# Patient Record
Sex: Male | Born: 1935
Health system: Southern US, Community
[De-identification: ages and names within clinical notes are randomized; demographics above are authoritative.]

## PROBLEM LIST (undated history)

## (undated) DIAGNOSIS — I442 Atrioventricular block, complete: Secondary | ICD-10-CM

## (undated) DIAGNOSIS — D4620 Refractory anemia with excess of blasts, unspecified: Principal | ICD-10-CM

## (undated) DIAGNOSIS — I10 Essential (primary) hypertension: Secondary | ICD-10-CM

## (undated) DIAGNOSIS — E538 Deficiency of other specified B group vitamins: Secondary | ICD-10-CM

## (undated) DIAGNOSIS — N200 Calculus of kidney: Secondary | ICD-10-CM

## (undated) DIAGNOSIS — N400 Enlarged prostate without lower urinary tract symptoms: Secondary | ICD-10-CM

## (undated) DIAGNOSIS — I5022 Chronic systolic (congestive) heart failure: Secondary | ICD-10-CM

## (undated) DIAGNOSIS — I509 Heart failure, unspecified: Secondary | ICD-10-CM

## (undated) DIAGNOSIS — Z95810 Presence of automatic (implantable) cardiac defibrillator: Secondary | ICD-10-CM

## (undated) DIAGNOSIS — I255 Ischemic cardiomyopathy: Secondary | ICD-10-CM

## (undated) DIAGNOSIS — E785 Hyperlipidemia, unspecified: Secondary | ICD-10-CM

## (undated) DIAGNOSIS — C61 Malignant neoplasm of prostate: Secondary | ICD-10-CM

## (undated) DIAGNOSIS — I2510 Atherosclerotic heart disease of native coronary artery without angina pectoris: Secondary | ICD-10-CM

## (undated) HISTORY — PX: PROSTATE SURGERY: SHX751

## (undated) HISTORY — DX: Essential (primary) hypertension: I10

## (undated) HISTORY — PX: POLYPECTOMY: SHX149

## (undated) HISTORY — DX: Chronic systolic (congestive) heart failure: I50.22

## (undated) HISTORY — DX: Malignant neoplasm of prostate: C61

## (undated) HISTORY — DX: Atrioventricular block, complete: I44.2

## (undated) HISTORY — DX: Heart failure, unspecified: I50.9

## (undated) HISTORY — DX: Benign prostatic hyperplasia without lower urinary tract symptoms: N40.0

## (undated) HISTORY — PX: COLONOSCOPY: SHX174

## (undated) HISTORY — DX: Ischemic cardiomyopathy: I25.5

## (undated) HISTORY — DX: Calculus of kidney: N20.0

## (undated) HISTORY — DX: Hyperlipidemia, unspecified: E78.5

## (undated) HISTORY — DX: Atherosclerotic heart disease of native coronary artery without angina pectoris: I25.10

## (undated) HISTORY — PX: CARDIAC DEFIBRILLATOR PLACEMENT: SHX171

## (undated) HISTORY — PX: CORONARY ARTERY BYPASS GRAFT: SHX141

## (undated) HISTORY — DX: Refractory anemia with excess of blasts, unspecified: D46.20

## (undated) HISTORY — DX: Presence of automatic (implantable) cardiac defibrillator: Z95.810

## (undated) HISTORY — PX: HERNIA REPAIR: SHX51

## (undated) HISTORY — DX: Deficiency of other specified B group vitamins: E53.8

---

## 2001-06-10 ENCOUNTER — Ambulatory Visit (HOSPITAL_COMMUNITY): Admission: RE | Admit: 2001-06-10 | Discharge: 2001-06-10 | Payer: Self-pay | Admitting: Ophthalmology

## 2001-07-22 ENCOUNTER — Ambulatory Visit (HOSPITAL_COMMUNITY): Admission: RE | Admit: 2001-07-22 | Discharge: 2001-07-22 | Payer: Self-pay | Admitting: Ophthalmology

## 2001-09-30 ENCOUNTER — Ambulatory Visit (HOSPITAL_COMMUNITY): Admission: RE | Admit: 2001-09-30 | Discharge: 2001-09-30 | Payer: Self-pay | Admitting: Pulmonary Disease

## 2004-04-26 ENCOUNTER — Ambulatory Visit (HOSPITAL_COMMUNITY): Admission: RE | Admit: 2004-04-26 | Discharge: 2004-04-26 | Payer: Self-pay | Admitting: Internal Medicine

## 2004-04-26 ENCOUNTER — Ambulatory Visit: Payer: Self-pay | Admitting: Internal Medicine

## 2004-07-04 ENCOUNTER — Ambulatory Visit (HOSPITAL_COMMUNITY): Admission: RE | Admit: 2004-07-04 | Discharge: 2004-07-04 | Payer: Self-pay | Admitting: Urology

## 2004-07-17 ENCOUNTER — Ambulatory Visit: Admission: RE | Admit: 2004-07-17 | Discharge: 2004-08-22 | Payer: Self-pay | Admitting: Radiation Oncology

## 2004-11-08 ENCOUNTER — Ambulatory Visit: Payer: Self-pay | Admitting: Cardiology

## 2004-11-15 ENCOUNTER — Ambulatory Visit: Payer: Self-pay

## 2004-11-22 ENCOUNTER — Ambulatory Visit: Payer: Self-pay | Admitting: Cardiology

## 2004-11-24 ENCOUNTER — Ambulatory Visit: Payer: Self-pay | Admitting: Cardiology

## 2004-11-24 ENCOUNTER — Inpatient Hospital Stay (HOSPITAL_BASED_OUTPATIENT_CLINIC_OR_DEPARTMENT_OTHER): Admission: RE | Admit: 2004-11-24 | Discharge: 2004-11-24 | Payer: Self-pay | Admitting: Cardiology

## 2004-11-28 ENCOUNTER — Ambulatory Visit: Payer: Self-pay | Admitting: Cardiology

## 2004-12-01 ENCOUNTER — Ambulatory Visit: Payer: Self-pay | Admitting: Cardiology

## 2004-12-07 ENCOUNTER — Ambulatory Visit: Payer: Self-pay | Admitting: Cardiology

## 2004-12-12 ENCOUNTER — Inpatient Hospital Stay (HOSPITAL_COMMUNITY): Admission: RE | Admit: 2004-12-12 | Discharge: 2004-12-20 | Payer: Self-pay | Admitting: Cardiothoracic Surgery

## 2005-01-03 ENCOUNTER — Ambulatory Visit: Payer: Self-pay | Admitting: Cardiology

## 2005-01-17 ENCOUNTER — Encounter (HOSPITAL_COMMUNITY): Admission: RE | Admit: 2005-01-17 | Discharge: 2005-02-09 | Payer: Self-pay | Admitting: Pediatrics

## 2005-02-12 ENCOUNTER — Encounter (HOSPITAL_COMMUNITY): Admission: RE | Admit: 2005-02-12 | Discharge: 2005-03-14 | Payer: Self-pay | Admitting: Cardiology

## 2005-02-27 ENCOUNTER — Ambulatory Visit: Payer: Self-pay | Admitting: Cardiology

## 2005-03-01 ENCOUNTER — Ambulatory Visit: Payer: Self-pay | Admitting: Cardiology

## 2005-03-09 ENCOUNTER — Ambulatory Visit (HOSPITAL_COMMUNITY): Admission: RE | Admit: 2005-03-09 | Discharge: 2005-03-09 | Payer: Self-pay | Admitting: Urology

## 2005-05-21 ENCOUNTER — Encounter (HOSPITAL_COMMUNITY): Admission: RE | Admit: 2005-05-21 | Discharge: 2005-06-20 | Payer: Self-pay | Admitting: Cardiology

## 2005-06-22 ENCOUNTER — Encounter (HOSPITAL_COMMUNITY): Admission: RE | Admit: 2005-06-22 | Discharge: 2005-06-22 | Payer: Self-pay | Admitting: Cardiology

## 2005-07-04 ENCOUNTER — Ambulatory Visit (HOSPITAL_COMMUNITY): Admission: RE | Admit: 2005-07-04 | Discharge: 2005-07-04 | Payer: Self-pay | Admitting: Internal Medicine

## 2005-07-04 ENCOUNTER — Ambulatory Visit: Payer: Self-pay | Admitting: Internal Medicine

## 2005-07-04 LAB — HM COLONOSCOPY

## 2005-07-13 ENCOUNTER — Encounter (HOSPITAL_COMMUNITY): Admission: RE | Admit: 2005-07-13 | Discharge: 2005-08-12 | Payer: Self-pay | Admitting: Cardiology

## 2005-08-13 ENCOUNTER — Encounter (HOSPITAL_COMMUNITY): Admission: RE | Admit: 2005-08-13 | Discharge: 2005-09-12 | Payer: Self-pay | Admitting: Cardiology

## 2005-09-14 ENCOUNTER — Encounter (HOSPITAL_COMMUNITY): Admission: RE | Admit: 2005-09-14 | Discharge: 2005-10-14 | Payer: Self-pay | Admitting: Cardiology

## 2006-01-02 ENCOUNTER — Ambulatory Visit: Payer: Self-pay | Admitting: Cardiology

## 2006-01-17 ENCOUNTER — Ambulatory Visit: Payer: Self-pay | Admitting: Cardiology

## 2006-01-17 ENCOUNTER — Encounter: Payer: Self-pay | Admitting: Cardiology

## 2006-01-17 ENCOUNTER — Ambulatory Visit: Payer: Self-pay

## 2006-02-04 ENCOUNTER — Ambulatory Visit: Payer: Self-pay | Admitting: Cardiology

## 2006-02-08 ENCOUNTER — Ambulatory Visit: Payer: Self-pay | Admitting: Internal Medicine

## 2006-02-25 ENCOUNTER — Ambulatory Visit: Payer: Self-pay | Admitting: Internal Medicine

## 2006-02-28 ENCOUNTER — Ambulatory Visit: Payer: Self-pay | Admitting: Internal Medicine

## 2006-02-28 ENCOUNTER — Ambulatory Visit (HOSPITAL_COMMUNITY): Admission: RE | Admit: 2006-02-28 | Discharge: 2006-03-01 | Payer: Self-pay | Admitting: Internal Medicine

## 2006-03-13 ENCOUNTER — Ambulatory Visit: Payer: Self-pay

## 2006-03-15 ENCOUNTER — Encounter (HOSPITAL_COMMUNITY): Admission: RE | Admit: 2006-03-15 | Discharge: 2006-04-14 | Payer: Self-pay | Admitting: Cardiology

## 2006-05-15 ENCOUNTER — Encounter (HOSPITAL_COMMUNITY): Admission: RE | Admit: 2006-05-15 | Discharge: 2006-06-14 | Payer: Self-pay | Admitting: Cardiology

## 2006-07-02 ENCOUNTER — Ambulatory Visit: Payer: Self-pay | Admitting: Internal Medicine

## 2006-07-08 ENCOUNTER — Encounter (HOSPITAL_COMMUNITY): Admission: RE | Admit: 2006-07-08 | Discharge: 2006-08-07 | Payer: Self-pay | Admitting: Cardiology

## 2006-08-12 ENCOUNTER — Encounter (HOSPITAL_COMMUNITY): Admission: RE | Admit: 2006-08-12 | Discharge: 2006-09-11 | Payer: Self-pay | Admitting: Cardiology

## 2006-08-30 ENCOUNTER — Ambulatory Visit: Payer: Self-pay | Admitting: Internal Medicine

## 2006-10-01 ENCOUNTER — Ambulatory Visit: Payer: Self-pay | Admitting: Internal Medicine

## 2006-12-16 ENCOUNTER — Ambulatory Visit: Payer: Self-pay | Admitting: Cardiology

## 2006-12-24 ENCOUNTER — Ambulatory Visit: Payer: Self-pay

## 2006-12-24 ENCOUNTER — Ambulatory Visit: Payer: Self-pay | Admitting: Cardiology

## 2006-12-24 LAB — CONVERTED CEMR LAB
AST: 16 units/L (ref 0–37)
Alkaline Phosphatase: 48 units/L (ref 39–117)
Basophils Absolute: 0 10*3/uL (ref 0.0–0.1)
Bilirubin, Direct: 0.1 mg/dL (ref 0.0–0.3)
Eosinophils Relative: 1.5 % (ref 0.0–5.0)
GFR calc non Af Amer: 69 mL/min
HCT: 45 % (ref 39.0–52.0)
HDL: 32 mg/dL — ABNORMAL LOW (ref >39.0)
Hemoglobin: 15.6 g/dL (ref 13.0–17.0)
LDL Cholesterol: 49 mg/dL (ref 0–99)
Lymphocytes Relative: 26.8 % (ref 12.0–46.0)
MCV: 87.8 fL (ref 78.0–100.0)
Neutro Abs: 3.1 10*3/uL (ref 1.4–7.7)
Neutrophils Relative %: 61.7 % (ref 43.0–77.0)
Pro B Natriuretic peptide (BNP): 43 pg/mL (ref 0.0–100.0)
Sodium: 142 meq/L (ref 135–145)
Triglycerides: 242 mg/dL (ref 0–149)
VLDL: 48 mg/dL — ABNORMAL HIGH (ref 0–40)
WBC: 5.1 10*3/uL (ref 4.5–10.5)

## 2007-04-01 ENCOUNTER — Ambulatory Visit: Payer: Self-pay | Admitting: Internal Medicine

## 2007-06-17 ENCOUNTER — Ambulatory Visit: Payer: Self-pay | Admitting: Cardiology

## 2007-06-18 ENCOUNTER — Ambulatory Visit: Payer: Self-pay

## 2007-06-18 ENCOUNTER — Ambulatory Visit: Payer: Self-pay | Admitting: Cardiology

## 2007-06-18 ENCOUNTER — Encounter: Payer: Self-pay | Admitting: Cardiology

## 2007-06-18 LAB — CONVERTED CEMR LAB
ALT: 24 units/L (ref 0–53)
BUN: 19 mg/dL (ref 6–23)
Basophils Relative: 0.2 % (ref 0.0–1.0)
CO2: 32 meq/L (ref 19–32)
Chloride: 105 meq/L (ref 96–112)
Creatinine, Ser: 1 mg/dL (ref 0.4–1.5)
GFR calc Af Amer: 93 mL/min
Glucose, Bld: 107 mg/dL — ABNORMAL HIGH (ref 70–99)
HDL: 31.1 mg/dL — ABNORMAL LOW (ref >39.0)
Hemoglobin: 14.7 g/dL (ref 13.0–17.0)
LDL Cholesterol: 52 mg/dL (ref 0–99)
MCV: 88.8 fL (ref 78.0–100.0)
Neutro Abs: 2.7 10*3/uL (ref 1.4–7.7)
Neutrophils Relative %: 61.1 % (ref 43.0–77.0)
Pro B Natriuretic peptide (BNP): 60 pg/mL (ref 0.0–100.0)
RBC: 4.87 M/uL (ref 4.22–5.81)
RDW: 12.5 % (ref 11.5–14.6)
Sodium: 142 meq/L (ref 135–145)
Triglycerides: 141 mg/dL (ref 0–149)
WBC: 4.4 10*3/uL — ABNORMAL LOW (ref 4.5–10.5)

## 2007-09-16 ENCOUNTER — Ambulatory Visit: Payer: Self-pay | Admitting: Internal Medicine

## 2007-12-29 ENCOUNTER — Ambulatory Visit (HOSPITAL_COMMUNITY): Admission: RE | Admit: 2007-12-29 | Discharge: 2007-12-29 | Payer: Self-pay | Admitting: Pulmonary Disease

## 2007-12-31 ENCOUNTER — Ambulatory Visit: Payer: Self-pay | Admitting: Cardiology

## 2008-01-01 ENCOUNTER — Ambulatory Visit (HOSPITAL_COMMUNITY): Admission: RE | Admit: 2008-01-01 | Discharge: 2008-01-01 | Payer: Self-pay | Admitting: Pulmonary Disease

## 2008-03-30 ENCOUNTER — Ambulatory Visit: Payer: Self-pay | Admitting: Internal Medicine

## 2008-05-14 LAB — HM COLONOSCOPY

## 2008-06-22 ENCOUNTER — Ambulatory Visit: Payer: Self-pay | Admitting: Cardiology

## 2008-06-22 LAB — CONVERTED CEMR LAB
ALT: 22 units/L (ref 0–53)
Albumin: 3.9 g/dL (ref 3.5–5.2)
Cholesterol: 115 mg/dL (ref 0–200)
HDL: 34.8 mg/dL — ABNORMAL LOW (ref >39.0)
Total Protein: 6.9 g/dL (ref 6.0–8.3)

## 2008-07-01 ENCOUNTER — Encounter (INDEPENDENT_AMBULATORY_CARE_PROVIDER_SITE_OTHER): Payer: Self-pay | Admitting: Internal Medicine

## 2008-07-01 ENCOUNTER — Ambulatory Visit (HOSPITAL_COMMUNITY): Admission: RE | Admit: 2008-07-01 | Discharge: 2008-07-01 | Payer: Self-pay | Admitting: Internal Medicine

## 2008-07-27 ENCOUNTER — Encounter: Payer: Self-pay | Admitting: Internal Medicine

## 2008-08-03 ENCOUNTER — Encounter: Payer: Self-pay | Admitting: Cardiology

## 2008-08-03 ENCOUNTER — Ambulatory Visit: Payer: Self-pay | Admitting: Internal Medicine

## 2008-08-03 DIAGNOSIS — E785 Hyperlipidemia, unspecified: Secondary | ICD-10-CM

## 2008-08-03 DIAGNOSIS — Z87442 Personal history of urinary calculi: Secondary | ICD-10-CM | POA: Insufficient documentation

## 2008-08-03 DIAGNOSIS — Z951 Presence of aortocoronary bypass graft: Secondary | ICD-10-CM | POA: Insufficient documentation

## 2008-08-03 DIAGNOSIS — C61 Malignant neoplasm of prostate: Secondary | ICD-10-CM | POA: Insufficient documentation

## 2008-08-03 DIAGNOSIS — I255 Ischemic cardiomyopathy: Secondary | ICD-10-CM | POA: Insufficient documentation

## 2008-08-03 DIAGNOSIS — I10 Essential (primary) hypertension: Secondary | ICD-10-CM | POA: Insufficient documentation

## 2008-08-03 DIAGNOSIS — I2589 Other forms of chronic ischemic heart disease: Secondary | ICD-10-CM

## 2008-08-03 DIAGNOSIS — I5022 Chronic systolic (congestive) heart failure: Secondary | ICD-10-CM | POA: Insufficient documentation

## 2008-08-03 DIAGNOSIS — I252 Old myocardial infarction: Secondary | ICD-10-CM | POA: Insufficient documentation

## 2008-08-03 DIAGNOSIS — I2510 Atherosclerotic heart disease of native coronary artery without angina pectoris: Secondary | ICD-10-CM | POA: Insufficient documentation

## 2008-08-24 DIAGNOSIS — I25810 Atherosclerosis of coronary artery bypass graft(s) without angina pectoris: Secondary | ICD-10-CM | POA: Insufficient documentation

## 2008-08-25 ENCOUNTER — Encounter: Payer: Self-pay | Admitting: Cardiology

## 2008-08-25 ENCOUNTER — Ambulatory Visit: Payer: Self-pay | Admitting: Cardiology

## 2008-11-02 ENCOUNTER — Ambulatory Visit: Payer: Self-pay | Admitting: Internal Medicine

## 2008-11-15 ENCOUNTER — Encounter: Payer: Self-pay | Admitting: Internal Medicine

## 2009-01-18 ENCOUNTER — Ambulatory Visit (HOSPITAL_COMMUNITY): Admission: RE | Admit: 2009-01-18 | Discharge: 2009-01-18 | Payer: Self-pay | Admitting: Pulmonary Disease

## 2009-02-08 ENCOUNTER — Ambulatory Visit: Payer: Self-pay | Admitting: Internal Medicine

## 2009-02-17 ENCOUNTER — Encounter: Payer: Self-pay | Admitting: Internal Medicine

## 2009-05-10 ENCOUNTER — Ambulatory Visit: Payer: Self-pay | Admitting: Internal Medicine

## 2009-06-02 ENCOUNTER — Encounter: Payer: Self-pay | Admitting: Internal Medicine

## 2009-07-26 ENCOUNTER — Ambulatory Visit: Payer: Self-pay | Admitting: Internal Medicine

## 2009-07-26 DIAGNOSIS — Z95810 Presence of automatic (implantable) cardiac defibrillator: Secondary | ICD-10-CM

## 2009-08-04 ENCOUNTER — Encounter: Payer: Self-pay | Admitting: Cardiology

## 2009-08-12 ENCOUNTER — Ambulatory Visit: Payer: Self-pay | Admitting: Cardiology

## 2009-09-27 ENCOUNTER — Ambulatory Visit: Payer: Self-pay | Admitting: Cardiology

## 2009-09-30 LAB — CONVERTED CEMR LAB
Albumin: 3.9 g/dL (ref 3.5–5.2)
Alkaline Phosphatase: 53 units/L (ref 39–117)
Bilirubin, Direct: 0.1 mg/dL (ref 0.0–0.3)
Total Bilirubin: 0.8 mg/dL (ref 0.3–1.2)
Total CHOL/HDL Ratio: 4
Triglycerides: 162 mg/dL — ABNORMAL HIGH (ref 0.0–149.0)

## 2009-10-25 ENCOUNTER — Ambulatory Visit: Payer: Self-pay | Admitting: Internal Medicine

## 2009-11-07 ENCOUNTER — Telehealth: Payer: Self-pay | Admitting: Internal Medicine

## 2009-11-10 ENCOUNTER — Encounter: Payer: Self-pay | Admitting: Internal Medicine

## 2010-01-26 ENCOUNTER — Encounter: Payer: Self-pay | Admitting: Internal Medicine

## 2010-01-26 ENCOUNTER — Ambulatory Visit: Payer: Self-pay | Admitting: Internal Medicine

## 2010-02-14 ENCOUNTER — Ambulatory Visit: Payer: Self-pay | Admitting: Cardiology

## 2010-02-22 ENCOUNTER — Encounter: Payer: Self-pay | Admitting: Internal Medicine

## 2010-02-23 ENCOUNTER — Encounter: Payer: Self-pay | Admitting: Cardiology

## 2010-02-23 ENCOUNTER — Ambulatory Visit: Payer: Self-pay

## 2010-02-23 ENCOUNTER — Ambulatory Visit: Payer: Self-pay | Admitting: Cardiology

## 2010-02-23 ENCOUNTER — Ambulatory Visit (HOSPITAL_COMMUNITY): Admission: RE | Admit: 2010-02-23 | Discharge: 2010-02-23 | Payer: Self-pay | Admitting: Cardiology

## 2010-02-23 LAB — CONVERTED CEMR LAB
BUN: 31 mg/dL — ABNORMAL HIGH (ref 6–23)
Calcium: 9.3 mg/dL (ref 8.4–10.5)
Chloride: 99 meq/L (ref 96–112)
GFR calc non Af Amer: 54.81 mL/min (ref >60)

## 2010-05-04 ENCOUNTER — Ambulatory Visit: Payer: Self-pay | Admitting: Internal Medicine

## 2010-05-23 ENCOUNTER — Encounter (INDEPENDENT_AMBULATORY_CARE_PROVIDER_SITE_OTHER): Payer: Self-pay | Admitting: *Deleted

## 2010-06-11 LAB — CONVERTED CEMR LAB
BUN: 21 mg/dL (ref 6–23)
CO2: 30 meq/L (ref 19–32)
Chloride: 104 meq/L (ref 96–112)
Eosinophils Relative: 2.4 % (ref 0.0–5.0)
GFR calc non Af Amer: 73.62 mL/min (ref >60)
Glucose, Bld: 91 mg/dL (ref 70–99)
Hemoglobin: 14.4 g/dL (ref 13.0–17.0)
MCHC: 34.8 g/dL (ref 30.0–36.0)
MCV: 91 fL (ref 78.0–100.0)
Monocytes Absolute: 0.5 10*3/uL (ref 0.1–1.0)
Potassium: 4.5 meq/L (ref 3.5–5.1)
RDW: 13.5 % (ref 11.5–14.6)
Sodium: 140 meq/L (ref 135–145)

## 2010-06-15 NOTE — Assessment & Plan Note (Signed)
Summary: 1 YR F/U      Allergies Added:   Primary Provider:  Kari Baars, MD  CC:  1 year follow up/Pt feeling good today.  No cardiac concerns.  History of Present Illness: The patient is 77 years old and return for followup management of CAD. He was found to have severe three-vessel disease and severe LV dysfunction on preoperative evaluation prior to prostate surgery in 2006. He underwent bypass surgery. He has done well since that time. He's had no recent chest pain or palpitations. He does get short of breath with moderate exertion but this has not changed.  He also has an ICD which was implanted by Dr. Ladona Ridgel in 2006.  His other problems include hypertension and hyperlipidemia. He also has prostate cancer and indicated that his last PSA was quite low. He brought a lipid profile in today and his LDL was 56 on Vytorin. For cost reasons he was recently switched to simvastatin.  Current Medications (verified): 1)  Aspirin Ec 325 Mg Tbec (Aspirin) .... Take One Tablet By Mouth Daily 2)  Diovan 160 Mg Tabs (Valsartan) .... Take One Tablet By Mouth Daily 3)  Vitamin C 500 Mg  Tabs (Ascorbic Acid) .Marland Kitchen.. 1 By Mouth Once Daily 4)  Vitamin D3 1000 Unit Caps (Cholecalciferol) .... Once Daily 5)  Carvedilol 25 Mg Tabs (Carvedilol) .... 1/2 By Mouth Two Times A Day 6)  Tylenol Extra Strength 500 Mg Tabs (Acetaminophen) .... Take 2 By Mouth As Needed 7)  Zyrtec Allergy 10 Mg Tabs (Cetirizine Hcl) .... Take 1 By Mouth As Needed 8)  Osteo Bi-Flex Triple Strength  Tabs (Misc Natural Products) .... Take One Tablet By Mouth Twice Daily. 9)  Simvastatin 40 Mg Tabs (Simvastatin) .Marland Kitchen.. 1 Tablet At Bedtime  Allergies (verified): 1)  ! Codeine  Past History:  Past Medical History: Reviewed history from 08/24/2008 and no changes required. Current Problems:  POSTSURGICAL AORTOCORONARY BYPASS STATUS (ICD-V45.81) NEPHROLITHIASIS, HX OF (ICD-V13.01) SYSTOLIC HEART FAILURE, CHRONIC  (ICD-428.22) MYOCARDIAL INFARCTION, HX OF (ICD-412) HYPERTENSION, UNSPECIFIED (ICD-401.9) PROSTATE CANCER (ICD-185) HYPERLIPIDEMIA-MIXED (ICD-272.4) CARDIOMYOPATHY, ISCHEMIC (ICD-414.8) CAD, UNSPECIFIED SITE (ICD-414.00) 1. Coronary artery disease, status post coronary artery bypass graft     surgery in 2006. 2. Ischemic cardiomyopathy, ejection fraction of 25-35%. 3. Systolic congestive heart failure, class II euvolemic. 4. Hyperlipidemia with low HDL. 5. History of prostate cancer, status post surgery. 6. Hypertension, treated. 7. Recent upper respiratory infection. 8. Abnormal chest x-ray with questionable nodule.  9.  S/P ICD VVI backup  Review of Systems       ROS is negative except as outlined in HPI.   Vital Signs:  Patient profile:   77 year old male Height:      65 inches Weight:      201 pounds BMI:     33.57 Pulse rate:   61 / minute Pulse rhythm:   regular BP sitting:   128 / 74  (left arm) Cuff size:   large  Vitals Entered By: Judithe Modest CMA (August 12, 2009 11:06 AM)  Physical Exam  Additional Exam:  Gen. Well-nourished, in no distress   Neck: No JVD, thyroid not enlarged, no carotid bruits Lungs: No tachypnea, clear without rales, rhonchi or wheezes Cardiovascular: Rhythm regular, PMI not displaced,  heart sounds  normal, no murmurs or gallops, no peripheral edema, pulses normal in all 4 extremities. Abdomen: BS normal, abdomen soft and non-tender without masses or organomegaly, no hepatosplenomegaly. MS: No deformities, no cyanosis or clubbing  Neuro:  No focal sns   Skin:  no lesions     ICD Specifications ICD Vendor:  Medtronic     ICD Model Number:  7232     ICD Serial Number:  [id number] ICD DOI:  02/28/2006      Lead 1:    Location: RV     DOI: 02/28/2006     Model #: [id number]     Serial #: [id number]     Status: active  Indications::  ICM  Explantation Comments: [facility]  ICD Follow Up ICD Dependent:  No      Episodes Coumadin:   No  Brady Parameters Mode VVI     Lower Rate Limit:  40      Tachy Zones VF:  200     VT:  250     VT1:  162     Impression & Recommendations:  Problem # 1:  CAD, AUTOLOGOUS BYPASS GRAFT (ICD-414.02) He had bypass surgery in 2006. He has had no recent chest pain. This problem appears stable. His updated medication list for this problem includes:    Aspirin Ec 325 Mg Tbec (Aspirin) .Marland Kitchen... Take one tablet by mouth daily    Carvedilol 25 Mg Tabs (Carvedilol) .Marland Kitchen... 1/2 by mouth two times a day  Orders: EKG w/ Interpretation (93000)  Problem # 2:  SYSTOLIC HEART FAILURE, CHRONIC (ICD-428.22) He has an ejection fraction of 25-30%. He has shortness of breath with exertion but appears euvolemic.  we will continue current therapy. His updated medication list for this problem includes:    Aspirin Ec 325 Mg Tbec (Aspirin) .Marland Kitchen... Take one tablet by mouth daily    Diovan 160 Mg Tabs (Valsartan) .Marland Kitchen... Take one tablet by mouth daily    Carvedilol 25 Mg Tabs (Carvedilol) .Marland Kitchen... 1/2 by mouth two times a day  Problem # 3:  AUTOMATIC IMPLANTABLE CARDIAC DEFIBRILLATOR SITU (ICD-V45.02) He has an ICD backup. This is followed by Dr. Ladona Ridgel.  Problem # 4:  HYPERLIPIDEMIA-MIXED (ICD-272.4) He was recently switched from Vytorin to simvastatin for cost reasons. We will check a lipid profile 6 weeks after starting simvastatin. His updated medication list for this problem includes:    Simvastatin 40 Mg Tabs (Simvastatin) .Marland Kitchen... 1 tablet at bedtime  Patient Instructions: 1)  Your physician recommends that you return for lab work in: 6 weeks after starting simvastatin. 2)  Your physician has recommended you make the following change in your medication: 1) complete your current dose of Vytorin then start simvastatin. 3)  Your physician wants you to follow-up in: 6 months.   You will receive a reminder letter in the mail two months in advance. If you don't receive a letter, please call our office to schedule the  follow-up appointment.

## 2010-06-15 NOTE — Cardiovascular Report (Signed)
Summary: Office Visit Remote   Office Visit Remote   Imported By: Roderic Ovens 06/02/2010 10:43:10  _____________________________________________________________________  External Attachment:    Type:   Image     Comment:   External Document

## 2010-06-15 NOTE — Cardiovascular Report (Signed)
Summary: Office Visit Remote   Office Visit Remote   Imported By: Roderic Ovens 02/23/2010 11:08:07  _____________________________________________________________________  External Attachment:    Type:   Image     Comment:   External Document

## 2010-06-15 NOTE — Letter (Signed)
Summary: Remote Device Check  [facility] [employer], Main Office  [address]   [place], [place] [address]   Phone: [phone]  Fax: [phone]     November 10, 2009 MRN: [id number]   RAYMUNDO ROUT [address] [place], [place]  [address]   Dear Mr. Goehring,   Your remote transmission was recieved and reviewed by your physician.  All diagnostics were within normal limits for you.  __X___Your next transmission is scheduled for: 01-26-2010.  Please transmit at any time this day.  If you have a wireless device your transmission will be sent automatically.   Sincerely,  Vella Kohler

## 2010-06-15 NOTE — Letter (Signed)
Summary: Remote Device Check  [facility], Main Office  [address]   [place], [place] [address]   Phone: [phone]  Fax: [phone]     February 22, 2010 MRN: [id number]   HUBERT RAATZ [address] [place], [place]  [address]   Dear Mr. Dozal,   Your remote transmission was recieved and reviewed by your physician.  All diagnostics were within normal limits for you.  __X___Your next transmission is scheduled for:  05-04-2010.  Please transmit at any time this day.  If you have a wireless device your transmission will be sent automatically.   Sincerely,  Vella Kohler

## 2010-06-15 NOTE — Assessment & Plan Note (Signed)
Summary: pc2  Medications Added VITAMIN D3 1000 UNIT CAPS (CHOLECALCIFEROL) once daily OSTEO BI-FLEX TRIPLE STRENGTH  TABS (MISC NATURAL PRODUCTS) Take one tablet by mouth twice daily. SIMVASTATIN 40 MG TABS (SIMVASTATIN) 1 tablet at bedtime        Visit Type:  Follow-up Primary Provider:  Kari Baars, MD   History of Present Illness: [name] returns today for followup.  He is a patient of Dr. Shaune Pollack with coronary artery disease, ischemic cardiomyopathy, and severe LV dysfunction with class II heart failure.  He also has prostate cancer and has had surgery.  The patient returns today for followup.  He denies chest pain.  He denies shortness of breath.  He continues to walk almost daily either outside when it is nice or inside at the [facility].  He has had no intercurrent ICD therapies. He wishes to change his cholesterol lowering medication.  Current Medications (verified): 1)  Aspirin Ec 325 Mg Tbec (Aspirin) .... Take One Tablet By Mouth Daily 2)  Vytorin 10-40 Mg Tabs (Ezetimibe-Simvastatin) .... Take One Tablet By Mouth Dailyat Bedtime 3)  Diovan 160 Mg Tabs (Valsartan) .... Take One Tablet By Mouth Daily 4)  Vitamin C 500 Mg  Tabs (Ascorbic Acid) .Marland Kitchen.. 1 By Mouth Once Daily 5)  Vitamin D3 1000 Unit Caps (Cholecalciferol) .... Once Daily 6)  Carvedilol 25 Mg Tabs (Carvedilol) .... 1/2 By Mouth Two Times A Day 7)  Tylenol Extra Strength 500 Mg Tabs (Acetaminophen) .... Take 2 By Mouth As Needed 8)  Zyrtec Allergy 10 Mg Tabs (Cetirizine Hcl) .... Take 1 By Mouth As Needed 9)  Osteo Bi-Flex Triple Strength  Tabs (Misc Natural Products) .... Take One Tablet By Mouth Twice Daily.  Allergies: 1)  ! Codeine  Past History:  Past Medical History: Last updated: 08/24/2008 Current Problems:  POSTSURGICAL AORTOCORONARY BYPASS STATUS (ICD-V45.81) NEPHROLITHIASIS, HX OF (ICD-V13.01) SYSTOLIC HEART FAILURE, CHRONIC (ICD-428.22) MYOCARDIAL INFARCTION, HX OF  (ICD-412) HYPERTENSION, UNSPECIFIED (ICD-401.9) PROSTATE CANCER (ICD-185) HYPERLIPIDEMIA-MIXED (ICD-272.4) CARDIOMYOPATHY, ISCHEMIC (ICD-414.8) CAD, UNSPECIFIED SITE (ICD-414.00) 1. Coronary artery disease, status post coronary artery bypass graft     surgery in 2006. 2. Ischemic cardiomyopathy, ejection fraction of 25-35%. 3. Systolic congestive heart failure, class II euvolemic. 4. Hyperlipidemia with low HDL. 5. History of prostate cancer, status post surgery. 6. Hypertension, treated. 7. Recent upper respiratory infection. 8. Abnormal chest x-ray with questionable nodule.  9.  S/P ICD VVI backup  Past Surgical History: Last updated: 08/03/2008 History of benign prostatic hypertrophy and prostate surgery. Status post aortocoronary bypass surgery in August 2006 with left      internal mammary artery to left anterior descending artery, saphenous      vein graft to circumflex, saphenous vein graft to distal right coronary  artery.  Status post colonoscopy and polypectomy. Status post hernia repair.  Review of Systems  The patient denies chest pain, syncope, dyspnea on exertion, and peripheral edema.    Vital Signs:  Patient profile:   77 year old male Height:      65 inches Weight:      203 pounds BMI:     33.90 Pulse rate:   63 / minute BP sitting:   140 / 80  (left arm)  Vitals Entered By: Laurance Flatten CMA (July 26, 2009 4:12 PM)  Physical Exam  General:  Elderly, well developed, well nourished, in no acute distress.  HEENT: normal Neck: supple. No JVD. Carotids 2+ bilaterally no bruits Cor: RRR no rubs, gallops or murmur Lungs:  CTA. Well healed ICD incision. Ab: soft, nontender. nondistended. No HSM. Good bowel sounds Ext: warm. no cyanosis, clubbing or edema Neuro: alert and oriented. Grossly nonfocal. affect pleasant     ICD Specifications ICD Vendor:  Medtronic     ICD Model Number:  7232     ICD Serial Number:  [id number] ICD DOI:  02/28/2006      Lead  1:    Location: RV     DOI: 02/28/2006     Model #: [id number]     Serial #: [id number]     Status: active  Indications::  ICM  Explantation Comments: [facility]  ICD Follow Up Remote Check?  No Battery Voltage:  3.10 V     Charge Time:  7.69 seconds     ICD Dependent:  No       ICD Device Measurements Right Ventricle:  Amplitude: 7.2 mV, Impedance: 440 ohms, Threshold: 1.0 V at 0.4 msec Shock Impedance: 44/54 ohms   Episodes Coumadin:  No Shock:  0     ATP:  0     Nonsustained:  2     Ventricular Pacing:  <0.1%  Brady Parameters Mode VVI     Lower Rate Limit:  40      Tachy Zones VF:  200     VT:  250     VT1:  162     Next Remote Date:  10/25/2009     Next Cardiology Appt Due:  07/13/2010 Tech Comments:  No parameter changes..  Device function normal.  Checked by industry.  [facility] transmissions every 3 months.  ROV 1 year with Dr. Ladona Ridgel in [place]. Altha Harm, LPN  July 26, 2009 4:32 PM  MD Comments:  Agree with above.  Impression & Recommendations:  Problem # 1:  AUTOMATIC IMPLANTABLE CARDIAC DEFIBRILLATOR SITU (ICD-V45.02) His device is working normally.  Will recheck in several months.  Problem # 2:  CAD, AUTOLOGOUS BYPASS GRAFT (ICD-414.02) He denies chest pain. Continue current meds. His updated medication list for this problem includes:    Aspirin Ec 325 Mg Tbec (Aspirin) .Marland Kitchen... Take one tablet by mouth daily    Carvedilol 25 Mg Tabs (Carvedilol) .Marland Kitchen... 1/2 by mouth two times a day  Problem # 3:  SYSTOLIC HEART FAILURE, CHRONIC (ICD-428.22) He remains class 2.  A low sodium diet and continued medical therapy are requested. His updated medication list for this problem includes:    Aspirin Ec 325 Mg Tbec (Aspirin) .Marland Kitchen... Take one tablet by mouth daily    Diovan 160 Mg Tabs (Valsartan) .Marland Kitchen... Take one tablet by mouth daily    Carvedilol 25 Mg Tabs (Carvedilol) .Marland Kitchen... 1/2 by mouth two times a day  Patient Instructions: 1)  Your physician has recommended you make the following  change in your medication:  finish Vytorin then start Simvastatin 40mg  every evening 2)  Your physician wants you to follow-up in: 12 months in [place]  You will receive a reminder letter in the mail two months in advance. If you don't receive a letter, please call our office to schedule the follow-up appointment. Prescriptions: SIMVASTATIN 40 MG TABS (SIMVASTATIN) 1 tablet at bedtime  #30 x 12   Entered by:   Layne Benton, RN, BSN   Authorized by:   Laren Boom, MD, [facility]   Signed by:   Layne Benton, RN, BSN on 07/26/2009   Method used:   Electronically to        [facility] (retail)  [address]       [place], [place]  [address]       Ph: [phone]       Fax: [phone]   RxID:   [id number]

## 2010-06-15 NOTE — Cardiovascular Report (Signed)
Summary: Office Visit Remote   Office Visit Remote   Imported By: Roderic Ovens 06/08/2009 12:27:20  _____________________________________________________________________  External Attachment:    Type:   Image     Comment:   External Document

## 2010-06-15 NOTE — Assessment & Plan Note (Signed)
Summary: bp check/ medication change/echo at 10:30/labs also  Nurse Visit   Vital Signs:  Patient profile:   77 year old male Height:      65 inches Weight:      195.50 pounds BMI:     32.65 Pulse rate:   66 / minute Resp:     24 per minute BP sitting:   132 / 78  (left arm)  Impression & Recommendations:  Problem # 1:  HYPERTENSION, UNSPECIFIED (ICD-401.9)  His updated medication list for this problem includes:    Aspirin Ec 325 Mg Tbec (Aspirin) .Marland Kitchen... Take one tablet by mouth daily    Diovan 160 Mg Tabs (Valsartan) .Marland Kitchen... Take one tablet by mouth daily    Carvedilol 25 Mg Tabs (Carvedilol) .Marland Kitchen... 1/2 by mouth two times a day    Hydrochlorothiazide 25 Mg Tabs (Hydrochlorothiazide) .Marland Kitchen... Take one tablet by mouth daily. Pt. in for B/P check this AM. Pt. also had an Echo and  BMET/BNP blood work today. B/P right arm 130/76 left arm 132/78, Pulse 66 beats/min. Pt. took medications this AM. Pt had no c/o at this time.   Allergies: 1)  ! Codeine

## 2010-06-15 NOTE — Progress Notes (Signed)
Summary: pt went through [employer] Note Outgoing Call Call back at [facility] Phone [phone]   Caller: Patient Reason for Call: Talk to Nurse, Talk to Doctor Summary of Call: pt went to court house yesterday and went through [facility] and they also use the wand as well and patient was wondering would that have done any damage and what he needs to do Initial call taken by: Omer Jack,  November 07, 2009 10:25 AM Summary of Call: spoke w/pt and answered questions.  Vella Kohler  November 07, 2009 3:29 PM

## 2010-06-15 NOTE — Letter (Signed)
Summary: Remote Device Check  [facility] [employer], Main Office  [address]   [place], [place] [address]   Phone: [phone]  Fax: [phone]     May 23, 2010 MRN: [id number]   [name] [address] [place], [place]  [address]   Dear [name],   Your remote transmission was recieved and reviewed by your physician.  All diagnostics were within normal limits for you.   ___X___Your next office visit is scheduled for:  March 2012 with Dr Ladona Ridgel.                             Please call our office to schedule an appointment.    Sincerely,  Vella Kohler

## 2010-06-15 NOTE — Assessment & Plan Note (Signed)
Summary: [name]  Medications Added HYDROCHLOROTHIAZIDE 25 MG TABS (HYDROCHLOROTHIAZIDE) Take one tablet by mouth daily.      Allergies Added:   Visit Type:  Follow-up Primary Provider:  Kari Baars, MD  CC:  no complaints.  History of Present Illness: in 2006 we saw him for a preoperative evaluation for prostate surgery and his evaluation found severe three-vessel disease and left and it is functioning he underwent bypass surgery. His ejection fraction was 25-35% and he had an ICD put in in 2006. He has chronic systolic CHF.  He says he's been doing well over the past 6 months. He does get short of breath with exertion. He's had no chest pain and no palpitations.  His other problems include hypertension hyperlipidemia.  Current Medications (verified): 1)  Aspirin Ec 325 Mg Tbec (Aspirin) .... Take One Tablet By Mouth Daily 2)  Diovan 160 Mg Tabs (Valsartan) .... Take One Tablet By Mouth Daily 3)  Vitamin C 500 Mg  Tabs (Ascorbic Acid) .Marland Kitchen.. 1 By Mouth Once Daily 4)  Vitamin D3 1000 Unit Caps (Cholecalciferol) .... Once Daily 5)  Carvedilol 25 Mg Tabs (Carvedilol) .... 1/2 By Mouth Two Times A Day 6)  Tylenol Extra Strength 500 Mg Tabs (Acetaminophen) .... Take 2 By Mouth As Needed 7)  Zyrtec Allergy 10 Mg Tabs (Cetirizine Hcl) .... Take 1 By Mouth As Needed 8)  Osteo Bi-Flex Triple Strength  Tabs (Misc Natural Products) .... Take One Tablet By Mouth Twice Daily. 9)  Simvastatin 40 Mg Tabs (Simvastatin) .Marland Kitchen.. 1 Tablet At Bedtime  Allergies (verified): 1)  ! Codeine  Past History:  Past Medical History: Reviewed history from 08/24/2008 and no changes required. Current Problems:  POSTSURGICAL AORTOCORONARY BYPASS STATUS (ICD-V45.81) NEPHROLITHIASIS, HX OF (ICD-V13.01) SYSTOLIC HEART FAILURE, CHRONIC (ICD-428.22) MYOCARDIAL INFARCTION, HX OF (ICD-412) HYPERTENSION, UNSPECIFIED (ICD-401.9) PROSTATE CANCER (ICD-185) HYPERLIPIDEMIA-MIXED (ICD-272.4) CARDIOMYOPATHY, ISCHEMIC  (ICD-414.8) CAD, UNSPECIFIED SITE (ICD-414.00) 1. Coronary artery disease, status post coronary artery bypass graft     surgery in 2006. 2. Ischemic cardiomyopathy, ejection fraction of 25-35%. 3. Systolic congestive heart failure, class II euvolemic. 4. Hyperlipidemia with low HDL. 5. History of prostate cancer, status post surgery. 6. Hypertension, treated. 7. Recent upper respiratory infection. 8. Abnormal chest x-ray with questionable nodule.  9.  S/P ICD VVI backup  Review of Systems       ROS is negative except as outlined in HPI.   Vital Signs:  Patient profile:   77 year old male Height:      65 inches Weight:      198 pounds BMI:     33.07 Pulse rate:   63 / minute BP sitting:   150 / 77  (left arm) Cuff size:   large  Vitals Entered By: Burnett Kanaris, CNA (February 14, 2010 3:23 PM)  Physical Exam  Additional Exam:  Gen. Well-nourished, in no distress   Neck: JVP just above the clavicle, thyroid not enlarged, no carotid bruits Lungs: No tachypnea, clear without rales, rhonchi or wheezes Cardiovascular: Rhythm regular, PMI not displaced,  heart sounds  normal, no murmurs or gallops, 1+ bilateral peripheral edema, pulses normal in all 4 extremities. Abdomen: BS normal, abdomen soft and non-tender without masses or organomegaly, no hepatosplenomegaly. MS: No deformities, no cyanosis or clubbing   Neuro:  No focal sns   Skin:  no lesions     ICD Specifications ICD Vendor:  Medtronic     ICD Model Number:  7232     ICD Serial Number:  [id number] ICD DOI:  02/28/2006      Lead 1:    Location: RV     DOI: 02/28/2006     Model #: [id number]     Serial #: [id number]     Status: active  Indications::  ICM  Explantation Comments: Carelink  ICD Follow Up ICD Dependent:  No      Episodes Coumadin:  No  Brady Parameters Mode VVI     Lower Rate Limit:  40      Tachy Zones VF:  200     VT:  250     VT1:  162     Impression & Recommendations:  Problem # 1:  CAD,  AUTOLOGOUS BYPASS GRAFT (ICD-414.02) He had bypass surgery in 2006. He said no recent chest pain and this problem appears stable. His updated medication list for this problem includes:    Aspirin Ec 325 Mg Tbec (Aspirin) .Marland Kitchen... Take one tablet by mouth daily    Carvedilol 25 Mg Tabs (Carvedilol) .Marland Kitchen... 1/2 by mouth two times a day  Problem # 2:  SYSTOLIC HEART FAILURE, CHRONIC (ICD-428.22) His last ejection fraction was 25-30%. He has slight JVD and 1+ edema. His blood pressure is also elevated. We will start hydrochlorothiazide 25 mg daily and get a BMP BNP and CBC today and the BMP in one week. We will arrange followup with Dr. Shirlee Latch in 4 months. His updated medication list for this problem includes:    Aspirin Ec 325 Mg Tbec (Aspirin) .Marland Kitchen... Take one tablet by mouth daily    Diovan 160 Mg Tabs (Valsartan) .Marland Kitchen... Take one tablet by mouth daily    Carvedilol 25 Mg Tabs (Carvedilol) .Marland Kitchen... 1/2 by mouth two times a day    Hydrochlorothiazide 25 Mg Tabs (Hydrochlorothiazide) .Marland Kitchen... Take one tablet by mouth daily.  Orders: TLB-BMP (Basic Metabolic Panel-BMET) (80048-METABOL) TLB-BNP (B-Natriuretic Peptide) (83880-BNPR) TLB-CBC Platelet - w/Differential (85025-CBCD) Echocardiogram (Echo)  Problem # 3:  AUTOMATIC IMPLANTABLE CARDIAC DEFIBRILLATOR SITU (ICD-V45.02) He has a backup VVI ICD. He has a wide QRS, severe systolic dysfunction, and clinical heart failure which is class 2-3. We will get an echo to reevaluate his LV function. He has an appointment to see Dr. Ladona Ridgel in March. When it is time to change out his ICD, I think we should consider upgrading his system to a biventricular system.  Problem # 4:  HYPERTENSION, UNSPECIFIED (ICD-401.9) His blood pressure is elevated today we are adding hydrochlorothiazide for both blood pressure and slight fluid retention. His updated medication list for this problem includes:    Aspirin Ec 325 Mg Tbec (Aspirin) .Marland Kitchen... Take one tablet by mouth daily     Diovan 160 Mg Tabs (Valsartan) .Marland Kitchen... Take one tablet by mouth daily    Carvedilol 25 Mg Tabs (Carvedilol) .Marland Kitchen... 1/2 by mouth two times a day    Hydrochlorothiazide 25 Mg Tabs (Hydrochlorothiazide) .Marland Kitchen... Take one tablet by mouth daily.  His updated medication list for this problem includes:    Aspirin Ec 325 Mg Tbec (Aspirin) .Marland Kitchen... Take one tablet by mouth daily    Diovan 160 Mg Tabs (Valsartan) .Marland Kitchen... Take one tablet by mouth daily    Carvedilol 25 Mg Tabs (Carvedilol) .Marland Kitchen... 1/2 by mouth two times a day    Hydrochlorothiazide 25 Mg Tabs (Hydrochlorothiazide) .Marland Kitchen... Take one tablet by mouth daily.  Patient Instructions: 1)  Start Hydrochlorothiazide (HCTZ) 25mg  once daily. 2)  Your physician wants you to follow-up in: 4 months with Dr. Shirlee Latch.  You will receive  a reminder letter in the mail two months in advance. If you don't receive a letter, please call our office to schedule the follow-up appointment. 3)  Your physician has requested that you have an echocardiogram.  Echocardiography is a painless test that uses sound waves to create images of your heart. It provides your doctor with information about the size and shape of your heart and how well your heart's chambers and valves are working.  This procedure takes approximately one hour. There are no restrictions for this procedure. 4)  Labwork today: bmet/bnp/cbc (428.22;414.01;402.10). 5)  Labwork the day of your echo: bmet/bnp (428.22;414.01;402.10) 6)  Blood pressure check the day of your echo. Prescriptions: HYDROCHLOROTHIAZIDE 25 MG TABS (HYDROCHLOROTHIAZIDE) Take one tablet by mouth daily.  #30 x 6   Entered by:   Sherri Rad, RN, BSN   Authorized by:   Lenoria Farrier, MD, [facility]   Signed by:   Sherri Rad, RN, BSN on 02/14/2010   Method used:   Electronically to        [facility] [address] (retail)       [address]       [place], [place]  [address]       Ph: [phone]       Fax: [phone]    RxID:   [id number]

## 2010-06-15 NOTE — Cardiovascular Report (Signed)
Summary: Office Visit Remote   Office Visit Remote   Imported By: Roderic Ovens 11/11/2009 16:38:08  _____________________________________________________________________  External Attachment:    Type:   Image     Comment:   External Document

## 2010-06-15 NOTE — Letter (Signed)
Summary: Remote Device Check  [facility], Main Office  [address]   [place], [place] [address]   Phone: [phone]  Fax: [phone]     June 02, 2009 MRN: [id number]   [name] [address] [place], [place]  [address]   Dear Mr. Hoey,   Your remote transmission was recieved and reviewed by your physician.  All diagnostics were within normal limits for you.    ___X___Your next office visit is scheduled for: MARCH 2011 WITH DR Ladona Ridgel. Please call our office to schedule an appointment.    Sincerely,  Proofreader

## 2010-06-20 ENCOUNTER — Ambulatory Visit (INDEPENDENT_AMBULATORY_CARE_PROVIDER_SITE_OTHER): Payer: Medicare Other | Admitting: Cardiology

## 2010-06-20 ENCOUNTER — Encounter: Payer: Self-pay | Admitting: Cardiology

## 2010-06-20 DIAGNOSIS — I5022 Chronic systolic (congestive) heart failure: Secondary | ICD-10-CM

## 2010-06-20 DIAGNOSIS — I2510 Atherosclerotic heart disease of native coronary artery without angina pectoris: Secondary | ICD-10-CM

## 2010-06-29 NOTE — Assessment & Plan Note (Signed)
Summary: EC6/4 MONTH ROV.FORMER PT OF BRODIE/SL/RS FROM BUMPLIST/MT/AMD  Medications Added SPIRONOLACTONE 25 MG TABS (SPIRONOLACTONE) one-half tabelt daily      Allergies Added:   Visit Type:  ec6 Primary Provider:  Kari Baars, MD  CC:  no complaints.  History of Present Illness: 77 yo with history of CAD s/p CABG and ischemic cardiomyopathy presents for cardiology followup.  Patient has been seen by Dr. Juanda Chance in the past and is seen by me for the first time today.  Patient was started on HCTZ at last appointment for mild volume overload.  His ankle edema that he had at that appointment has resolved.  He walks daily for about [place] mile in the mall with no exertional dyspnea on flat ground.  He is limited more by his hip pain than by exertional dyspnea.  He is able to climb a flight of steps without problems.  He gardens in the summer.  No chest pain.    Labs (10/11): K 4.2, creatinine 1.3, BNP 62  Current Medications (verified): 1)  Aspirin Ec 325 Mg Tbec (Aspirin) .... Take One Tablet By Mouth Daily 2)  Diovan 160 Mg Tabs (Valsartan) .... Take One Tablet By Mouth Daily 3)  Vitamin C 500 Mg  Tabs (Ascorbic Acid) .Marland Kitchen.. 1 By Mouth Once Daily 4)  Vitamin D3 1000 Unit Caps (Cholecalciferol) .... Once Daily 5)  Carvedilol 25 Mg Tabs (Carvedilol) .... 1/2 By Mouth Two Times A Day 6)  Tylenol Extra Strength 500 Mg Tabs (Acetaminophen) .... Take 2 By Mouth As Needed 7)  Zyrtec Allergy 10 Mg Tabs (Cetirizine Hcl) .... Take 1 By Mouth As Needed 8)  Osteo Bi-Flex Triple Strength  Tabs (Misc Natural Products) .... Take One Tablet By Mouth Twice Daily. 9)  Simvastatin 40 Mg Tabs (Simvastatin) .Marland Kitchen.. 1 Tablet At Bedtime 10)  Hydrochlorothiazide 25 Mg Tabs (Hydrochlorothiazide) .... Take One Tablet By Mouth Daily.  Allergies (verified): 1)  ! Codeine  Past History:  Past Medical History: 1. CAD: s/p CABG 8/06 with LIMA-LAD, SVG-CFX, SVG-distal RCA.  2. NEPHROLITHIASIS, HX OF (ICD-V13.01) 3.  SYSTOLIC HEART FAILURE, CHRONIC (ICD-428.22): Ischemic cardiomyopathy with last echo (2011) showing EF 35-40%, mild LV hypertrophy, inferoposterior akinesis, grade I diastolic dysfunction, biatrial enlargement, mild mitral regurgitation.  Patient has Medtronic ICD 4. HYPERTENSION, UNSPECIFIED (ICD-401.9) 5. PROSTATE CANCER (ICD-185): s/p cryosurgical ablation.  6. HYPERLIPIDEMIA-MIXED (ICD-272.4) 7. Abnormal chest x-ray with questionable nodule.  8.  Medtronic ICD   Family History: CAD  Social History: He lives with his wife in [place].  Retired Training and development officer.  He does not smoke  Review of Systems       All systems reviewed and negative except as per HPI.   Vital Signs:  Patient profile:   77 year old male Height:      65 inches Weight:      197.50 pounds BMI:     32.98 Pulse rate:   63 / minute BP sitting:   116 / 68  (left arm) Cuff size:   regular  Vitals Entered By: Caralee Ates CMA (June 20, 2010 3:03 PM)  Physical Exam  General:  Elderly male in no distress.  Mildly obese.  Neck:  Neck supple, JVP 7 cm. No masses, thyromegaly or abnormal cervical nodes. Lungs:  Clear bilaterally to auscultation and percussion. Heart:  Non-displaced PMI, chest non-tender; regular rate and rhythm, S1, S2 without murmurs, rubs or gallops. Carotid upstroke normal, no bruit.  No edema, no varicosities. Abdomen:  Bowel sounds  positive; abdomen soft and non-tender without masses, organomegaly, or hernias noted. No hepatosplenomegaly. Extremities:  No clubbing or cyanosis. Neurologic:  Alert and oriented x 3. Psych:  Normal affect.    ICD Specifications ICD Vendor:  Medtronic     ICD Model Number:  7232     ICD Serial Number:  [id number] H ICD DOI:  02/28/2006      Lead 1:    Location: RV     DOI: 02/28/2006     Model #: [id number]     Serial #: [id number]     Status: active  Indications::  ICM  Explantation Comments: Carelink  ICD Follow Up ICD Dependent:  No       Episodes Coumadin:  No  Brady Parameters Mode VVI     Lower Rate Limit:  40      Tachy Zones VF:  200     VT:  250     VT1:  162     Impression & Recommendations:  Problem # 1:  CAD, AUTOLOGOUS BYPASS GRAFT (ICD-414.02) Stable with no ischemic symptoms.  Continue ASA, statin, ARB, Coreg.   Problem # 2:  SYSTOLIC HEART FAILURE, CHRONIC (ICD-428.22) NYHA class II symptoms.  Seems to be doing well.  Volume status seems better on HCTZ.  I will go ahead and leave him on HCTZ.  I will also start spironolactone 12.5 mg daily.  Continue current doses of valsartan and carvedilol.  BMET/BNP in 2 wks.   Problem # 3:  HYPERLIPIDEMIA-MIXED (ICD-272.4) Will arrange lipids/LFTs.  Goal LDL < 70.   Patient Instructions: 1)  Your physician has recommended you make the following change in your medication:  2)  Start Spironolactone 12.5mg  daily--this will be one-half of a 25mg  tablet daily. 3)  Your physician recommends that you return for a FASTING lipid profile/liver profile/BMP/BNP in 2 weeks---414.01  423.22:  4)  Your physician wants you to follow-up in: 6 months with Dr Shirlee Latch.Font[place] No 2012).  You will receive a reminder letter in the mail two months in advance. If you don't receive a letter, please call our office to schedule the follow-up appointment. Prescriptions: SPIRONOLACTONE 25 MG TABS (SPIRONOLACTONE) one-half tabelt daily  #15 x 6   Entered by:   Katina Dung, RN, BSN   Authorized by:   Marca Ancona, MD   Signed by:   Katina Dung, RN, BSN on 06/20/2010   Method used:   Electronically to        [facility] [address] (retail)       [address]       [place], [place]  [address]       Ph: [phone]       Fax: [phone]   RxID:   [id number]

## 2010-07-04 ENCOUNTER — Other Ambulatory Visit: Payer: Self-pay | Admitting: Cardiology

## 2010-07-04 ENCOUNTER — Encounter (INDEPENDENT_AMBULATORY_CARE_PROVIDER_SITE_OTHER): Payer: Self-pay | Admitting: *Deleted

## 2010-07-04 ENCOUNTER — Other Ambulatory Visit (INDEPENDENT_AMBULATORY_CARE_PROVIDER_SITE_OTHER): Payer: Medicare Other

## 2010-07-04 DIAGNOSIS — I5022 Chronic systolic (congestive) heart failure: Secondary | ICD-10-CM

## 2010-07-04 DIAGNOSIS — I2589 Other forms of chronic ischemic heart disease: Secondary | ICD-10-CM

## 2010-07-04 LAB — BASIC METABOLIC PANEL
BUN: 30 mg/dL — ABNORMAL HIGH (ref 6–23)
Calcium: 9.3 mg/dL (ref 8.4–10.5)
Chloride: 106 mEq/L (ref 96–112)
Creatinine, Ser: 1.2 mg/dL (ref 0.4–1.5)
Glucose, Bld: 122 mg/dL — ABNORMAL HIGH (ref 70–99)
Potassium: 4.7 mEq/L (ref 3.5–5.1)

## 2010-07-04 LAB — LIPID PANEL
Cholesterol: 138 mg/dL (ref 0–200)
Triglycerides: 184 mg/dL — ABNORMAL HIGH (ref 0.0–149.0)
VLDL: 36.8 mg/dL (ref 0.0–40.0)

## 2010-07-04 LAB — HEPATIC FUNCTION PANEL
Alkaline Phosphatase: 39 U/L (ref 39–117)
Bilirubin, Direct: 0.1 mg/dL (ref 0.0–0.3)
Total Bilirubin: 0.7 mg/dL (ref 0.3–1.2)

## 2010-07-04 LAB — BRAIN NATRIURETIC PEPTIDE: Pro B Natriuretic peptide (BNP): 49.3 pg/mL (ref 0.0–100.0)

## 2010-07-21 ENCOUNTER — Encounter: Payer: Self-pay | Admitting: Internal Medicine

## 2010-07-21 ENCOUNTER — Encounter (INDEPENDENT_AMBULATORY_CARE_PROVIDER_SITE_OTHER): Payer: Medicare Other | Admitting: Internal Medicine

## 2010-07-21 DIAGNOSIS — I2589 Other forms of chronic ischemic heart disease: Secondary | ICD-10-CM

## 2010-07-21 DIAGNOSIS — I5022 Chronic systolic (congestive) heart failure: Secondary | ICD-10-CM

## 2010-07-21 DIAGNOSIS — I10 Essential (primary) hypertension: Secondary | ICD-10-CM

## 2010-07-25 NOTE — Assessment & Plan Note (Signed)
Summary: device/saf    Primary Provider:  Kari Baars, MD   History of Present Illness: [name] returns today for followup.  He is a patient of Dr. Shaune [place]ack with coronary artery disease, ischemic cardiomyopathy, and severe LV dysfunction with class II heart failure.  He also has prostate cancer and has had surgery.  The patient returns today for followup.  He denies chest pain.  He denies shortness of breath.  He continues to walk almost daily either outside when it is nice or inside at the [facility].  He has had no intercurrent ICD therapies. His only complaint is pain in his hips if he walks too much.  Allergies: 1)  ! Codeine  Past History:  Past Medical History: Last updated: 06/20/2010 1. CAD: s/p CABG 8/06 with LIMA-LAD, SVG-CFX, SVG-distal RCA.  2. NEPHROLITHIASIS, HX OF (ICD-V13.01) 3. SYSTOLIC HEART FAILURE, CHRONIC (ICD-428.22): Ischemic cardiomyopathy with last echo (2011) showing EF 35-40%, mild LV hypertrophy, inferoposterior akinesis, grade I diastolic dysfunction, biatrial enlargement, mild mitral regurgitation.  Patient has Medtronic ICD 4. HYPERTENSION, UNSPECIFIED (ICD-401.9) 5. PROSTATE CANCER (ICD-185): s/p cryosurgical ablation.  6. HYPERLIPIDEMIA-MIXED (ICD-272.4) 7. Abnormal chest x-ray with questionable nodule. 8.  Medtronic ICD   Past Surgical History: Last updated: 08/03/2008 History of benign prostatic hypertrophy and prostate surgery. Status post aortocoronary bypass surgery in August 2006 with left      internal mammary artery to left anterior descending artery, saphenous      vein graft to circumflex, saphenous vein graft to distal right coronary  artery.  Status post colonoscopy and polypectomy. Status post hernia repair.  Review of Systems  The patient denies chest pain, syncope, dyspnea on exertion, and peripheral edema.    Vital Signs:  Patient profile:   77 year old male Height:      65 inches Weight:      195.25 pounds BMI:      32.61 Pulse rate:   60 / minute Resp:     18 per minute BP sitting:   100 / 64  (right arm) Cuff size:   large  Vitals Entered By: [name] CMA (July 21, 2010 9:03 AM)  Physical Exam  General:  Elderly male in no distress.  Mildly obese.  Head:  normocephalic and atraumatic Eyes:  PERRLA/EOM intact; conjunctiva and lids normal. Mouth:  Teeth, gums and palate normal except for a missing tooth. Oral mucosa normal. Neck:  Neck supple, JVP 7 cm. No masses, thyromegaly or abnormal cervical nodes. Lungs:  Clear bilaterally to auscultation with no wheezes, rales, or rhonchi. Heart:  Non-displaced PMI, chest non-tender; regular rate and rhythm, S1, S2 without murmurs, rubs or gallops. Carotid upstroke normal, no bruit.  No edema, no varicosities. Abdomen:  Bowel sounds positive; abdomen soft and non-tender without masses, organomegaly, or hernias noted. No hepatosplenomegaly. Pulses:  pulses normal in all 4 extremities Extremities:  No clubbing or cyanosis. Neurologic:  Alert and oriented x 3.    ICD Specifications ICD Vendor:  Medtronic     ICD Model Number:  7232     ICD Serial Number:  [id number] H ICD DOI:  02/28/2006      Lead 1:    Location: RV     DOI: 02/28/2006     Model #: [id number]     Serial #: [id number]     Status: active  Indications::  ICM  Explantation Comments: Carelink  ICD Follow Up ICD Dependent:  No      Episodes Coumadin:  No  Brady Parameters Mode VVI     Lower Rate Limit:  40      Tachy Zones VF:  200     VT:  250     VT1:  162     MD Comments:  Normal device function.  Impression & Recommendations:  Problem # 1:  AUTOMATIC IMPLANTABLE CARDIAC DEFIBRILLATOR SITU (ICD-V45.02) His device is working normally. Will recheck in several months.  Problem # 2:  CAD, AUTOLOGOUS BYPASS GRAFT (ICD-414.02) He denies any anginal symptoms. Continue current meds. His updated medication list for this problem includes:    Aspirin Ec 325 Mg Tbec (Aspirin) .Marland Kitchen... Take  one tablet by mouth daily    Carvedilol 25 Mg Tabs (Carvedilol) .Marland Kitchen... 1/2 by mouth two times a day  Problem # 3:  SYSTOLIC HEART FAILURE, CHRONIC (ICD-428.22) He remains class 2. He will continue his current meds and maintain a low sodium diet. His updated medication list for this problem includes:    Aspirin Ec 325 Mg Tbec (Aspirin) .Marland Kitchen... Take one tablet by mouth daily    Diovan 160 Mg Tabs (Valsartan) .Marland Kitchen... Take one tablet by mouth daily    Carvedilol 25 Mg Tabs (Carvedilol) .Marland Kitchen... 1/2 by mouth two times a day    Hydrochlorothiazide 25 Mg Tabs (Hydrochlorothiazide) .Marland Kitchen... Take one tablet by mouth daily.    Spironolactone 25 Mg Tabs (Spironolactone) ..... One-half tabelt daily  Patient Instructions: 1)  Your physician wants you to follow-up in: 12 months with Dr Court Joy will receive a reminder letter in the mail two months in advance. If you don't receive a letter, please call our office to schedule the follow-up appointment. 2)  Your physician recommends that you continue on your current medications as directed. Please refer to the Current Medication list given to you today.

## 2010-08-01 NOTE — Cardiovascular Report (Signed)
Summary: Office Visit   Office Visit   Imported By: Roderic Ovens 07/26/2010 12:01:01  _____________________________________________________________________  External Attachment:    Type:   Image     Comment:   External Document

## 2010-09-07 ENCOUNTER — Other Ambulatory Visit: Payer: Self-pay | Admitting: *Deleted

## 2010-09-07 MED ORDER — SIMVASTATIN 40 MG PO TABS: 40.0000 mg | ORAL_TABLET | Freq: Every evening | ORAL | Status: DC

## 2010-09-20 ENCOUNTER — Other Ambulatory Visit: Payer: Self-pay | Admitting: *Deleted

## 2010-09-20 MED ORDER — HYDROCHLOROTHIAZIDE 25 MG PO TABS: 25.0000 mg | ORAL_TABLET | Freq: Every day | ORAL | Status: DC

## 2010-09-26 NOTE — Assessment & Plan Note (Signed)
[facility]                            CARDIOLOGY OFFICE NOTE   NAME:BOLICKEivan, Gallina                       MRN:          [id number]  DATE:06/17/2007                            DOB:          Feb 01, 1927    PRIMARY CARE PHYSICIAN:  Dr. Shaune [place].   ELECTROPHYSIOLOGIST:  Dr. Lewayne Bunting.   CLINICAL HISTORY:  Mr. Eckert is 77 years old and returned for follow-up  management of his coronary artery disease and ischemic cardiomyopathy.  He had a remote circumflex VVI  and then was found to have an ischemic  Myoview scan and severe three-vessel disease on screening for prostate  surgery and underwent bypass surgery.  He subsequently underwent surgery  for his prostate cancer.   He has done quite well since that time.  He has had no recent chest pain  or palpitations.  He does get short of breath with moderate activity but  this has not changed.  We had increased his Diovan on a previous visit  and his blood pressure got too low and Dr. Abbe Amsterdam had to cut it back to  160 a day.   PAST MEDICAL HISTORY:  Significant for hyperlipidemia and previous  prostate cancer treated with surgery.   CURRENT MEDICATIONS:  Aspirin, Vytorin, metoprolol, Diovan, and  vitamins.   PHYSICAL EXAMINATION:  Blood pressure 141/80 and the pulse 75 and  regular.  There was no venous tension.  The carotid pulses were full without  bruits.  CHEST:  Clear.  HEART:  Cardiac rhythm was regular.  I heard no murmurs or gallops.  ABDOMEN:  Soft with normal bowel sounds. There is no hepatosplenomegaly.  Peripheral pulses were full and there is no peripheral edema.   Electrocardiogram showed an intraventricular conduction with QRS  duration of 148 milliseconds.   IMPRESSION:  1. Coronary artery disease status post coronary bypass graft surgery      in 2006.  2. Ischemic cardiomyopathy, ejection fraction of 25-35%.  3. Systolic congestive heart failure class 2, euvolemic.  4.  Hyperlipidemia.  5. History of prostate cancer treated with surgery.  6. Hypertension.   RECOMMENDATIONS:  I think Mr. Corsino is doing well.  He has not had an  evaluation of his LV function in more than a year and a half. I will  schedule him for a 2-D echo.  I think Coreg would be a better choice for  him than Lopressor and we switched him from Lopressor 50 twice a day to  Coreg 25 twice a day. When he comes back for his echocardiogram with get  fasting blood work including a BNP, BMP, CBC, lipid and liver and TSH.  I will see him back in follow-up in 6 months.  We interrogated his ICD  today and he had 5 episodes of nonsustained tach but no treated  arrhythmias.  He is set at backup VVI at 40.     Bruce Elvera Lennox Juanda Chance, MD, [facility]  Electronically Signed    BRB/MedQ  DD: 06/17/2007  DT: 06/17/2007  Job #: [id number]

## 2010-09-26 NOTE — Assessment & Plan Note (Signed)
[facility]                         ELECTROPHYSIOLOGY OFFICE NOTE   NAME:[name], Tooker                       MRN:          [id number]  DATE:08/03/2008                            DOB:          17-Aug-1926    [name] returns today for followup.  He is a patient of Dr. Shaune Pollack with coronary artery disease, ischemic cardiomyopathy, and  severe LV dysfunction with class II heart failure.  He also has prostate  cancer and has had surgery.  The patient returns today for followup.  He  denies chest pain.  He denies shortness of breath.  He continues to walk  almost daily either outside when it is nice or inside at the [facility].  The patient returns today for followup.  He has had no intercurrent ICD  therapies.   MEDICATIONS:  1. Aspirin 325 a day.  2. Vytorin 10/40.  3. Metoprolol 50 mg twice daily.  4. Diovan 160 a day.  5. Multiple vitamins.  6. Occasional Tylenol.   PHYSICAL EXAMINATION:  GENERAL:  He is a pleasant, well appearing,  elderly man in no distress.  VITAL SIGNS:  Blood pressure was 133/67, the pulse was 66 and regular,  the respirations were 18, weight was 201 pounds.  NECK:  No jugular venous distention.  LUNGS:  Clear bilaterally to auscultation.  No wheezes, rales, or  rhonchi are present.  CARDIAC:  Regular rate and rhythm.  Normal S1 and S2.  ABDOMEN:  Soft, nontender.  EXTREMITIES:  No edema.   Interrogation of his defibrillator demonstrates Medtronic maximal model  [id number].  The R-waves were 6, the impedance 440, the threshold a volt at  0.4.  Battery voltage was 3.13 volts.  There were 4 nonsustained  episodes of VT.   IMPRESSION:  1. Ischemic cardiomyopathy.  2. Congestive heart failure.  3. Status post implantable cardioverter-defibrillator insertion.   DISCUSSION:  Overall, [name] is stable.  He has continued to do  quite well despite his multiple medical problems.  His defibrillator is  working normally.   He is clearly euvolemic today.  I have asked that he continue his current medical therapies and I will  see him back for ICD followup in 1 year.     Doylene Canning. Ladona Ridgel, MD  Electronically Signed    GWT/MedQ  DD: 08/03/2008  DT: 08/04/2008  Job #: [id number]   cc:   Ramon Dredge L. Juanetta Gosling, M.D.

## 2010-09-26 NOTE — Assessment & Plan Note (Signed)
[name]                            CARDIOLOGY OFFICE NOTE   NAME:BOLICKCortez, Luke                       MRN:          [id number]  DATE:12/31/2007                            DOB:          May 23, 1926    PRIMARY CARE PHYSICIAN:  Ramon Dredge L. Juanetta Gosling, MD   ELECTROPHYSIOLOGIST:  Doylene Canning. Ladona Ridgel, MD   CLINICAL HISTORY:  [name] is a 77 year old and returns for followup  management of his coronary heart disease, ischemic cardiomyopathy, and  congestive heart failure.  We saw him preoperatively prior to prostate  surgery and had an abnormal Myoview scan and was found to have severe  three-vessel disease and subsequently underwent bypass surgery.  He has  subsequently had surgery for his prostate cancer.  He also had an ICD  placed, because of a low ejection fraction of 25-35%.   He had done extremely well since that time.  He has had no chest pain or  palpitations.  He has had recent upper respiratory infection for which  he has been treated with steroids and cough medicine by Dr. Juanetta Gosling and  so he has had some increased shortness of breath lately.   PAST MEDICAL HISTORY:  Significant for hyperlipidemia with low HDL.  He  also has a history of the prostate cancer.   CURRENT MEDICATIONS:  1. Aspirin.  2. Vytorin 10/40.  3. Metoprolol 50 mg b.i.d.  4. Diovan 160 mg daily.  5. He recently has been put on Ceftin and methylprednisolone.   PHYSICAL EXAMINATION:  VITAL SIGNS:  The blood pressure was 137/70.  The  pulse was 75 and regular.  NECK:  There was no venous distention.  The carotid pulses are full  without bruits.  CHEST:  Few scattered wheezes.  CARDIAC:  Rhythm was regular.  I could hear no murmurs or gallops.  ABDOMEN:  Soft.  Normal bowel sounds.  EXTREMITIES:  Peripheral pulses are full with no peripheral edema.   Electrocardiogram showed intraventricular conduction delay and sinus  rhythm.   We interpreted his ICD and he had no tachy  episode and no treated  episodes.  He is programmed to VVI backup at 40 and had a good threshold  on the ventricular lead.   IMPRESSION:  1. Coronary artery disease, status post coronary artery bypass graft      surgery in 2006.  2. Ischemic cardiomyopathy, ejection fraction of 25-35%.  3. Systolic congestive heart failure, class II euvolemic.  4. Hyperlipidemia with low HDL.  5. History of prostate cancer, status post surgery.  6. Hypertension, treated.  7. Recent upper respiratory infection.  8. Abnormal chest x-ray with questionable nodule.   RECOMMENDATIONS:  I think Mr. Hollerbach is well compensated from a cardiac  standpoint and his ICD is functioning well.  We will make any change in  his therapy.  He is scheduled for a CT scan by Dr. Juanetta Gosling because of an  abnormal chest x-ray and I told him if he needs any surgery that he  needs to be back in touch with [place] about clearance and we would consider  a Myoview scan and/or echogram.  He has an appointment to see Dr. Ladona Ridgel  back for an ICD in February, so I plan to see him in April 2010.  We  will get a fasting lipid profile either in February or April or up in  [place].     Bruce Elvera Lennox Juanda Chance, MD, [facility]  Electronically Signed    BRB/MedQ  DD: 12/31/2007  DT: 01/01/2008  Job #: [id number]

## 2010-09-26 NOTE — Op Note (Signed)
NAMEJAKWON, [name]                ACCOUNT NO.:  [id number]   MEDICAL RECORD NO.:  [id number]          PATIENT TYPE:  AMB   LOCATION:  DAY                           FACILITY:  [facility]   PHYSICIAN:  Lionel December, M.D.    DATE OF BIRTH:  [birth year]   DATE OF PROCEDURE:  07/01/2008  DATE OF DISCHARGE:                                PROCEDURE NOTE   PROCEDURE:  Colonoscopy with snare polypectomy.   INDICATIONS:  [name] is a 77 year old Caucasian male with a history of  colonic polyps.  He had 3 polyps removed back in 2005 and 1 polyp was 3  cm with intramucosal carcinoma.  His last exam was in 2007 and he had 1  adenoma removed.   FAMILY HISTORY:  Significant the fact that his father died over the year  of diagnosis of colon carcinoma at age 66.  Procedure was self reviewed  with the patient and informed consent was obtained.   MEDICATIONS FOR CONSCIOUS SEDATION:  Demerol 25 mg IV and Versed 3 mg  IV.   FINDINGS:  Procedure performed in Endoscopy Suite.  The patient's vital  signs and O2 sats were monitored during the procedure and remained  stable.  The patient was placed in the left lateral recumbent position  and rectal examination was performed.  No abnormality noted in external  or digital exam.  Pentax video scope was placed through the rectum and  advanced under vision into sigmoid colon beyond.  Preparation was  excellent.  Scope was passed into the cecum, which was identified by  appendiceal orifice and ileocecal valve.  Pictures were taken for the  record.  As the scope was withdrawn, colonic mucosa was carefully  examined.  There was a 4- to 5-mm polyp at hepatic flexure, which was  snared, rest of it was coagulated, but some was removed for histology.  There was a 6-mm sessile polyp at proximal transverse colon __________  right bend with difficult approach.  This was snared piecemeal,  polypectomy was complete.  Both of the pieces were obtained.  There was  another small  flat polyp at distal transverse colon, which was snared,  it was polycoagulated and part of the polyp was removed for histologic  diagnosis.  There was another tiny polyp at distal sigmoid colon, which  was coagulated.  A single small diverticulum was noted at the sigmoid  colon.  Rectal mucosa was normal.  Scope was retroflexed to examine  anorectal junction, which was unremarkable.  Endoscope was straightened  and withdrawn.  The patient tolerated the procedure well.   Withdrawal time was 25 minutes.   Please note that the patient's AICD/pacemaker was inactivated by placing  a magnet.  The magnet was removed at the end of the procedure.   FINAL DIAGNOSES:  1. Examination performed to cecum.  2. Four small polyps, three were snared as above and one was      coagulated using snare tip.   A single small diverticulum at sigmoid colon.   RECOMMENDATIONS:  Standard instructions were given.  The patient will  resume his  aspirin in 1 week and I will be contacting with the results  of biopsy.      Lionel December, M.D.  Electronically Signed     NR/MEDQ  D:  07/01/2008  T:  07/01/2008  Job:  [id number]   cc:   Dr. Juanetta Gosling

## 2010-09-26 NOTE — Assessment & Plan Note (Signed)
[facility]                            CARDIOLOGY OFFICE NOTE   NAME:[name], [name]                       MRN:          [id number]  DATE:12/16/2006                            DOB:          May 13, 1927    PRIMARY CARE PHYSICIAN:  Ramon Dredge L. Juanetta Gosling, M.D.   UROLOGIST:  Lucrezia Starch. Earlene Plater, M.D.   EP PHYSICIAN:  Doylene Canning. Ladona Ridgel, M.D.   CLINICAL HISTORY:  Mr. Wadding is 77 years old and had remote circumflex  PCI and was screen for prostate surgery in early 2007 with an abnormal  Myoview scan.  Subsequently catheterization showed 3-vessel coronary  disease, and he underwent bypass surgery.  His ejection fraction was 34%  prior to bypass surgery.  He subsequently had an echocardiogram which  showed ejection fraction of 25-30%.  He underwent ICD implantation by  Dr. Ladona Ridgel.   He has done quite well recently with no recent chest pain or  palpitations.  He does have shortness of breath with moderate exertion  and has noted some slight edema in his legs.   PAST MEDICAL HISTORY:  Significant for:  1. Hyperlipidemia.  2. Previous prostate surgery for cancer.   CURRENT MEDICATIONS:  Include Fosamax, aspirin, Vytorin, metoprolol, and  Diovan.   PHYSICAL EXAMINATION:  VITAL SIGNS: On examination today, the blood  pressure is 144/90 and the pulse 71 and regular.  NECK:  There was no venous distention.  The carotid pulses were full,  and there were no bruits.  Jugular venous pulsation was questionably  visible right at the clavicle.  CHEST:  Clear without rales or rhonchi.  HEART:  Rhythm was regular.  Heart sounds were normal.  There were no  murmurs or gallops.  ABDOMEN:  Soft with normal bowel sounds.  There was no  hepatosplenomegaly.  EXTREMITIES:  There was 1+ edema bilaterally, and pedal pulses were  equal.   We interrogated the defibrillator, and he had one episode of  nonsustained ventricular tachycardia.  His lead impedance and threshold  were  good.  He is programmed with VVI back up at 40.   IMPRESSION:  1. Coronary artery disease status post remote percutaneous coronary      intervention of the circumflex and status post coronary artery      bypass surgery August 2006.  2. Ischemic cardiomyopathy with ejection fraction of 25-35%.  3. Congestive heart failure, Class II, with possible mild volume      overload.  4. Hyperlipidemia.  5. History of prostate cancer.  6. Hypertension.   RECOMMENDATIONS:  I think Mr. Ramsburg is doing well, but his blood  pressure is up, and he may have some slight volume overload. We will  increase Diovan from 160 a day to a combination of  Diovan/hydrochlorothiazide 12.5/320 daily.  We will get a CBC, BMP, BNP,  lipid and liver profiles, and TSH in one week.  I will plan to see him  back in 6 months.     Bruce Elvera Lennox Juanda Chance, MD, [facility]  Electronically Signed    BRB/MedQ  DD: 12/16/2006  DT: 12/16/2006  Job #:  [id number]   cc:   Ramon Dredge L. Juanetta Gosling, M.D.  Ronald L. Earlene Plater, M.D.  Doylene Canning. Ladona Ridgel, MD

## 2010-09-29 NOTE — Op Note (Signed)
NAMEITHIEL, LIEBLER NO.:  [id number]   MEDICAL RECORD NO.:  [id number]          PATIENT TYPE:  OIB   LOCATION:  2001                         FACILITY:  [facility]   PHYSICIAN:  Doylene Canning. Ladona Ridgel, MD    DATE OF BIRTH:  February 03, 1927   DATE OF PROCEDURE:  02/28/2006  DATE OF DISCHARGE:  03/01/2006                                 OPERATIVE REPORT   PROCEDURE PERFORMED:  Implantation of a single-chamber ICD.   INDICATIONS:  Ischemic cardiomyopathy, status post myocardial infarction,  status post bypass surgery in 2006 with class II heart failure, and EF of  30%.   I. INTRODUCTION:  The patient is a very pleasant 77 year old male with a  history of coronary disease, status post bypass surgery in 2006.  He is  status post MI in 46.  He has class II heart failure.  He has an EF of 30%  by echocardiogram and Myoview stress testing and is now referred for ICD  implantation.   II. PROCEDURE:  After informed consent was obtained, the patient was taken  to the diagnostic EP lab in the fasting state.  After the usual preparation  and draping, intravenous fentanyl and midazolam were given for sedation.  30  cc of lidocaine was infiltrated into the left infraclavicular region.  A 7  cm incision was carried out over this region.  Electrocautery was utilized  to dissect down the fascial plane.  The left subclavian vein was punctured,  and the Medtronic Sprint Quattro Secure, model [id number] bipolar defibrillation  lead, serial number [id number], was advanced into the right ventricle.  Mapping was carried out in the right ventricle, and at the final site the R-  waves measured 15 mV.  After the lead was actively fixed, the pacing  impedance was 769 ohms.  The pacing threshold was 0.5 volts at 0.5  milliseconds.  Ten-volt pacing did not stimulate the diaphragm.  With the  defibrillation lead in satisfactory position, it was secured to the  subpectoralis fascia with a figure-of-eight silk  suture.  The sewing sleeve  was also secured with silk suture.  At this point, electrocautery was  utilized to make a subcutaneous pocket.  Kanamycin irrigation was utilized  to irrigate the pocket, and electrocautery utilized to ensure hemostasis.  The Medtronic Maximo VR, model [id number], single-chamber defibrillator, serial  number [id number],  was then connected to the defibrillation lead and placed  in the subcutaneous pocket.  Generator was secured with silk suture.  Additional kanamycin was utilized to irrigate the pocket, and defibrillation  threshold testing carried out.   After the patient was more deeply sedated with fentanyl and Versed, VF was  induced with a T-wave shock.  A 15-joule shock was subsequently delivered  which terminated VF and restored sinus rhythm.  After 5 minutes were allowed  to elapse, a second defibrillation threshold test was carried out.  Again,  VF was induced with T-wave shock, and again a 15-joule shock was delivered  which terminated ventricular fibrillation and restored sinus rhythm.  At  this point, no additional defibrillation threshold testing  was carried out,  and the incision was closed with a layer of 2-0 Vicryl followed by a layer  of 3-0 Vicryl, followed by a layer of 4-0 Vicryl.  Benzoin was painted on  the skin, and Steri-Strips were applied and a pressure dressing was placed,  and the patient was returned to his room in satisfactory condition.   III. COMPLICATIONS:  There were no immediate procedural complications.   IV. RESULTS:  This demonstrate successful implantation of a Medtronic single-  chamber defibrillator in a patient with an ischemic cardiomyopathy and EF of  30% by echocardiogram, status post bypass surgery over a year ago.           ______________________________  Doylene Canning. Ladona Ridgel, MD     GWT/MEDQ  D:  03/01/2006  T:  03/02/2006  Job:  [id number]   cc:   Everardo Beals. Juanda Chance, MD, [facility]  Oneal Deputy. Juanetta Gosling, M.D.

## 2010-09-29 NOTE — Op Note (Signed)
[name], [name]                ACCOUNT NO.:  [id number]   MEDICAL RECORD NO.:  [id number]          PATIENT TYPE:  AMB   LOCATION:  DAY                           FACILITY:  [facility]   PHYSICIAN:  Lionel December, M.D.    DATE OF BIRTH:  07-25-1926   DATE OF PROCEDURE:  04/26/2004  DATE OF DISCHARGE:                                 OPERATIVE REPORT   PROCEDURE PERFORMED:  Total colonoscopy with polypectomy.   INDICATIONS FOR PROCEDURE:  [name] is a 77 year old Caucasian male who is  here for a screening colonoscopy.  His father was diagnosed with colon  carcinoma at age 48 and died of metastatic disease a year later.  He opted  not to have his colonoscopy until now.  Procedure risks were reviewed with  the patient and informed consent was obtained.   PREOP MEDICATIONS:  Demerol 25 mg IV, Versed 4 mg IV in divided dose.   FINDINGS:  Procedure performed in endoscopy suite.  The patient's vital  signs and oxygen saturations were monitored during the procedure and  remained stable.  The patient was placed in left lateral position and rectal  examination performed.  No abnormality noted on external or digital exam.  Olympus video scope was placed in the rectum and advanced under vision into  the sigmoid colon and beyond.  Preparation was satisfactory.  Redundant  splenic flexure but scope was advanced to the cecum which was identified by  appendiceal orifice and ileocecal valve.  Pictures taken for the record.  As  the scope withdrawn, colonic mucosa was carefully examined.  There was a  large, over 3 cm size, polyp on a short stalk at distal sigmoid colon which  was snared and retrieved for histologic examination.  Another 10 to 12 mm  polyp was noted at rectosigmoid junction which was snared and the third  polyp  about 1 cm and was also snared. There was another tiny polyp at  rectosigmoid that was coagulated.  All of these polyps were retrieved for  histologic examination.  The scope was  retroflexed and examined at anorectal  junction which was unremarkable.  Then the scope straightened and withdrawn.  The patient tolerated the procedure well.   FINAL DIAGNOSIS:  Examination performed to cecum.  Large over 3 cm size  polyp snared from sigmoid colon along with two more 10 to 12 mm polyps  snared (rectosigmoid and rectum).   RECOMMENDATIONS:  Standard instructions given.  I will be contacting patient  with biopsy results and further recommendations.     [name]   NR/MEDQ  D:  04/26/2004  T:  04/26/2004  Job:  [id number]   cc:   Ramon Dredge L. Juanetta Gosling, M.D.  [address]  [place]  [place] [address]  Fax: [phone]

## 2010-09-29 NOTE — Assessment & Plan Note (Signed)
[facility]                         ELECTROPHYSIOLOGY OFFICE NOTE   NAME:BOLICKTeion, Ballin                       MRN:          [id number]  DATE:07/02/2006                            DOB:          Apr 13, 1927    SUBJECTIVE:  [name] returns today for followup.  He is a very  pleasant middle-aged man, with a history of an ischemic cardiomyopathy  and congestive heart failure, who is status post ICD implantation  secondary to all of the above.  He denies chest pain or shortness of  breath.  Otherwise had no specific complaints today.   PHYSICAL EXAMINATION:  GENERAL:  He is a pleasant well-appearing middle-  aged man, in no distress.  VITAL SIGNS:  Blood pressure 124/88, pulse 64 and regular, respirations  18, weight 198 pounds.  NECK:  No jugular venous distention.  LUNGS:  Clear bilaterally to auscultation.  CARDIOVASCULAR:  A regular rate and rhythm with normal S1 and S2.  EXTREMITIES:  Demonstrated no clubbing, cyanosis or edema.  The pulses  were 2+ and symmetric.   CURRENT MEDICATIONS:  1. Fosamax.  2. Vytorin 10/40 mg.  3. Metoprolol 50 mg b.i.d.  4. Diovan 160 mg daily.   Interrogation of his defibrillator demonstrates a Medtronic Maximo with  R-waves of 7 and impedance of 456 ohm's with a threshold of 1 volt at  0.2.  Battery voltage is 3.2 volts.  There are no inter-current ICD  therapies.   IMPRESSION:  1. Ischemic cardiomyopathy.  2. Congestive heart failure.  3. Status post implantable cardioverter defibrillator insertion.   DISCUSSION/FOLLOWUP:  1. Overall [name] is stable.  His defibrillator is working      normally four months after its implant.  I will plan to see him      back in the office in one year.  2. He will follow up in our [facility] program.  3. Today we have given him a prescription for Diovan, as he was having      problems with      coughing on Enalapril.  4. He will follow up also with Dr. Ramon Dredge L.  Hawkins.     Doylene Canning. Ladona Ridgel, MD  Electronically Signed    GWT/MedQ  DD: 07/02/2006  DT: 07/02/2006  Job #: [id number]   cc:   Ramon Dredge L. Juanetta Gosling, M.D.

## 2010-09-29 NOTE — Assessment & Plan Note (Signed)
[facility]                           ELECTROPHYSIOLOGY OFFICE NOTE   NAME:BOLICKHaward, [name]                       MRN:          [id number]  DATE:02/08/2006                            DOB:          07-02-1926    HISTORY:  [name] returns today for follow-up. He is a very pleasant 77-  year-old man referred by Dr. Charlies Constable for consideration for prophylactic  AICD implantation.  The patient has a history of coronary artery disease  status post MI in 35.  He underwent bypass surgery in August, 2006. He has  done well since then, denying chest pain or shortness of breath. He does  note that when he exerts himself strenuously or climbs stairs, he gets short  of breath but otherwise can do, for the most part, whatever he would like  without limitation.  I judge his symptoms to be a class II with regard to  heart failure. The patient has never had syncope.  He is now referred for  consideration for prophylactic AICD implantation.   MEDICATIONS:  1. Aspirin 325 mg daily.  2. Vytorin 10/40.  3. Metoprolol 50 mg twice a day.  4. Enalapril 5 mg twice daily.   SOCIAL HISTORY:  The patient is married. He is retired. He has a history of  tobacco use, stopping smoking in 1994.  He denies alcohol use.   FAMILY HISTORY:  Notable for both parents being deceased. His mother age 30  of meningitis, his father at age 77 of complications of colon cancer.   Additional past medical history is notable for prostate cancer status post  cryosurgery and history of right sided hernia.   REVIEW OF SYSTEMS:  Negative except as noted in the HPI.  He has really done  quite well with the exception of mild heart failure symptoms.  His 2-D echo  demonstrates an EF of 30%.   PHYSICAL EXAMINATION:  GENERAL: He is pleasant, well-appearing 77 year old  man in no acute distress.  VITAL SIGNS:  The blood pressure was 132/88, pulse is 80 and regular,  respirations were 18. The  weight was 201 pounds.  HEENT:  Normocephalic and atraumatic. Pupils are equal and round.  The  oropharynx is moist. Sclerae anicteric.  NECK:  No jugular venous distention.  There is no thyromegaly. The trachea  is midline. The carotids are 2+ and symmetric.  LUNGS:  The lungs are clear bilaterally on auscultation.  There are no  wheezes, rales or rhonchi. There is no extra work of breathing.  CARDIAC:  Regular rate and rhythm with normal S1, S2 .  There are no  murmurs, rubs or gallops present. The PMI was somewhat enlarged and  laterally displaced, though his obesity made this difficult to appreciate.  ABDOMEN:  Soft, nontender, nondistended.  There is no organomegaly. The  bowel sounds are present.  There is no rebound or guarding.  EXTREMITIES: Extremities demonstrated no cyanosis, clubbing or edema.  The  pulses were 2+ and symmetric.  NEUROLOGICAL:  Alert and oriented x 3 with cranial nerves intact. The  strength was 5/5 and symmetric.  The EKG demonstrates sinus rhythm with an intraventricular conduction delay  and a QRS duration of 142 milliseconds.   IMPRESSION:  1. Ischemic cardiomyopathy, ejection fraction 30%.  2. Class II heart failure.  3. Status post myocardial infarction.  4. Dyslipidemia.   DISCUSSION:  I have discussed the treatment options with the patient.  The  risks, benefits, goals and expectations of prophylactic AICD implantation  have been discussed with him. He would like to proceed with this at the  earliest possible convenient time.            ______________________________  Doylene Canning. Ladona Ridgel, MD     GWT/MedQ  DD:  02/08/2006  DT:  02/10/2006  Job #:  [id number]   cc:   Ramon Dredge L. Juanetta Gosling, M.D.

## 2010-09-29 NOTE — Assessment & Plan Note (Signed)
[facility]                         ELECTROPHYSIOLOGY OFFICE NOTE   NAME:[name], [name]                       MRN:          [id number]  DATE:08/30/2006                            DOB:          1926-08-16    [name] returns today for followup.  He is concerned about migration  of his defibrillator.  The patient is a very pleasant 77 year old male  with a history of ischemic cardiomyopathy, congestive heart failure, who  is status post ICD insertion back in October of 2007.  He returns today  for followup.  He denies chest pain or shortness of breath and overall  he feels well.  He denies fevers or chills.  He notes that the lateral  aspect of the incision may have changed and moved and notes that his  device appears to be more prominent.   ON EXAM:  He is a pleasant, well-appearing, middle-aged man in no  distress.  Blood pressure 133/75, the pulse 67 and regular, respirations  are 18 and the weight was 200 pounds.  NECK:  Revealed no jugular venous distention.  LUNGS:  Clear bilaterally to auscultation, no wheezes, rales or rhonchi.  CARDIOVASCULAR EXAM:  Reveals a regular rate and rhythm with normal S1,  S2.  EXTREMITIES:  Demonstrated no edema.  Examination of his ICD pocket demonstrates it is healing nicely.  There  is no fluctuance, no hematoma, no erythema, no tenderness.  Overall, it  is well-healed.   IMPRESSION:  1. Ischemic cardiomyopathy.  2. Congestive heart failure.  3. Status post ICD insertion.  4. Minimal movement of his ICD generator.   DISCUSSION:  Overall, the patient is stable.  I have recommended  watchful waiting with regard to his ICD pocket.  We will see him back in  the office in several months.     Doylene Canning. Ladona Ridgel, MD  Electronically Signed    GWT/MedQ  DD: 08/30/2006  DT: 08/31/2006  Job #: [id number]

## 2010-09-29 NOTE — Discharge Summary (Signed)
NAMECALEB, DECOCK NO.:  [id number]   MEDICAL RECORD NO.:  [id number]          PATIENT TYPE:  OIB   LOCATION:  2001                         FACILITY:  [facility]   PHYSICIAN:  Doylene Canning. Ladona Ridgel, MD    DATE OF BIRTH:  1927/03/06   DATE OF ADMISSION:  02/28/2006  DATE OF DISCHARGE:  03/01/2006                                 DISCHARGE SUMMARY   PROCEDURE:  Insertion of an implantable cardioverter-defibrillator  (Medtronic Maximo).   TIME AT DISCHARGE:  28 minutes.   PRIMARY DIAGNOSIS:  Ischemic cardiomyopathy with an ejection fraction of 30%  by echocardiogram.   SECONDARY DIAGNOSES:  1. Chronic systolic heart failure, class II.  2. Hyperlipidemia.  3. History of benign prostatic hypertrophy and prostate surgery.  4. Status post myocardial infarction in 1994.  5. Status post aortocoronary bypass surgery in August 2006 with left      internal mammary artery to left anterior descending artery, saphenous      vein graft to circumflex, saphenous vein graft to distal right coronary      artery.  6. Status post colonoscopy and polypectomy.  7. Hypertension.  8. Remote history of tobacco use.  9. History of nephrolithiasis.  10.Status post hernia repair.   HOSPITAL COURSE:  Mr. Duvall is a 77 year old male with a history of  coronary artery disease and bypass surgery in 2006.  His EF recently was 30%  by echocardiogram and he was felt to have class II heart failure symptoms.  Dr. Juanda Chance referred him to Dr. Lewayne Bunting for consideration of an ICD.  Mr. Stfort was scheduled to come in for this procedure on February 28, 2006.   A Medtronic Maximo single-lead ICD was inserted without complications.  There was acceptable V-pacing and sensing post procedure.   The next day his ICD check showed all program values within normal limits.  His chest x-ray showed the ICD in place with no acute disease.  He was  evaluated by Dr. Ladona Ridgel and considered stable for discharge with  outpatient  follow-up arranged.   DISCHARGE INSTRUCTIONS:  1. His activity level is to be increased per the activity sheet.  2. He is not to shower for a week and not to drive for 2 weeks.  3. He is to follow up at our office with a wound check on October 31 at      10:30 and with Dr. Ladona Ridgel on January 25 at 9:45.   DISCHARGE MEDICATIONS:  1. Fosamax 70 mg per week.  2. Aspirin 325 mg daily.  3. Vytorin 10.40 mg daily.  4. Metoprolol 50 mg b.i.d.  5. Enalapril 5 mg b.i.d.     ______________________________  Theodore Demark, PA-C    ______________________________  Doylene Canning. Ladona Ridgel, MD    RB/MEDQ  D:  03/01/2006  T:  03/03/2006  Job:  [id number]   cc:   Ramon Dredge L. Juanetta Gosling, M.D.  Bruce Elvera Lennox Juanda Chance, MD, [facility]

## 2010-09-29 NOTE — Consult Note (Signed)
[name], HARDMAN NO.:  [id number]   MEDICAL RECORD NO.:  [id number]          PATIENT TYPE:  OIB   LOCATION:  6501                         FACILITY:  [facility]   PHYSICIAN:  Sheliah Plane, MD    DATE OF BIRTH:  1926-12-21   DATE OF CONSULTATION:  12/07/2004  DATE OF DISCHARGE:  11/24/2004                                   CONSULTATION   REQUESTING PHYSICIAN:  Charlies Constable, M.D. [facility]   FOLLOW-UP CARDIOLOGIST:  Charlies Constable, M.D. [facility].   PRIMARY CARE PHYSICIAN:  Edward L. Juanetta Gosling, M.D.   UROLOGIST:  Lucrezia Starch. Earlene Plater, M.D.   REASON FOR CONSULTATION:  Coronary artery disease.   HISTORY OF PRESENT ILLNESS:  The patient is a 77 year old male without any  symptoms of chest pain who presented with rising PSA and was diagnosed with  prostate cancer in the process for being evaluated for this.  He was  referred for cardiology evaluation.  Stress test was performed by Dr. Juanda Chance  which then led to a cardiac catheterization which demonstrated significant  three-vessel coronary artery disease and the patient is referred for  consideration for bypass surgery.  He does have a history of myocardial  infarction in the past in 1994 and subsequently had angioplasty on the  circumflex performed.   CARDIAC RISK FACTORS:  He denies hypertension.  He does have hyperlipidemia,  denies diabetes.  He is a remote smoker but quit in 1994.  He has had no  previous stroke.  He denies any claudication, denies renal insufficiency.   PAST MEDICAL HISTORY:  Kidney stones.  Hernia repair.  His PSA has been as  high as 5.26 and most recently was down to 0.96.   The patient is married, lives with his wife, retired Armed forces training and education officer.   MEDICATIONS:  1.  Metoprolol 50 mg half tablet b.i.d.  2.  Lipitor 20 mg a day.  3.  Aspirin.  4.  Vitamin C.  5.  Fosamax.  6.  Tylenol.  7.  Lasix 40 mg daily.  8.  Enalapril __________  mg b.i.d.   ALLERGIES:  None known.   CARDIAC REVIEW OF SYSTEMS:   Negative for chest pain.  He does have  exertional shortness of breath and an episode of syncope several years ago  which was reported to be a TIA although the patient had no lateralizing  symptoms.  He denies orthopnea or presyncope, syncope.   REVIEW OF SYSTEMS:  Denies any constitutional symptoms.  Denies any  hemoptysis, denies shortness of breath.  Other review of systems are  negative.   PHYSICAL EXAMINATION:  VITAL SIGNS: Blood pressure 128/64, pulse 72 and  regular, respiratory rate is 18, O2 saturation is 97%.  GENERAL: The patient is awake, alert, neurologically intact.  HEENT: Pupils are equal, round, reactive to light.  NECK: Without carotid bruits.  LUNGS: Clear bilaterally.  ABDOMEN: Benign without palpable masses or palpable enlargement of the  abdominal aorta.  EXTREMITIES: Examination of the lower extremities were without obvious  deformity.  He has +1 DP pulses bilaterally and 2+ posterior tibial pulses  bilaterally.   Carotid Doppler studies were done, 1-39% right internal carotid artery  stenosis.  No left internal carotid stenosis.  The patient has no lower  extremity or upper extremity arterial occlusive disease.  A cardiac  catheterization shows LAD with 60% proximal narrowing and circumflex is  completely occluded with collateral filling of the distal vessels.  Right  coronary artery is also completely occluded.  Ventriculogram shows an  ejection fraction of approximately 30%.  Coronary artery bypass grafting  prior to intervention on his prostate has been recommended because of his  significant three-vessel coronary disease with decreased LV function.  The  risks of surgery including death, infection, stroke, myocardial infarction,  bleeding, and blood transfusion were all discussed with the patient and his  family in detail.  The risks of surgery are discussed and the patient is  willing to proceed with coronary artery bypass grafting.  The patient is   agreeable and signed informed consent.       EG/MEDQ  D:  12/07/2004  T:  12/07/2004  Job:  [id number]

## 2010-09-29 NOTE — Assessment & Plan Note (Signed)
[facility]                              CARDIOLOGY OFFICE NOTE   ERRIN, CHEWNING                       MRN:          [id number]  DATE:01/02/2006                            DOB:          [birth year]    PRIMARY CARE PHYSICIAN:  Shaune Pollack, M.D.   UROLOGIST:  Lucrezia Starch. Earlene Plater, M.D.   PAST MEDICAL HISTORY:  Mr. Sponaugle is 77 year old and had coronary  intervention of a circumflex artery 12 years ago and had a Myoview scan as a  screening procedure prior to prostate surgery which was abnormal and  underwent catheterization and subsequent bypass surgery by Dr. Tyrone Sage for  three-vessel disease.  Ejection fraction was 34% prior to bypass surgery.   He has done well since that time and has subsequently gotten through his  bypass surgery which was cryotherapy by Dr. Earlene Plater last  October.  He now is having no symptoms of chest pain, shortness of breath or  palpitations.   He has a past medical history significant for hyperlipidemia as well as the  prostate surgery.   CURRENT MEDICATIONS:  1. Fosamax.  2. Aspirin.  3. Vytorin.  4. Metoprolol.   EXAMINATION:  VITAL SIGNS:  On examination the blood pressure is 130/78 and  the pulse 69 and regular.  NECK:  There was no venous distention, the carotid pulse were full without  bruits.  CHEST:  Clear.  HEART:  Rhythm was regular.  No murmurs or gallops.  ABDOMEN:  Soft, no organomegaly.  EXTREMITIES:  Peripheral pulses are full with no peripheral edema.   ECG showed intraventricular conduction by the left bundle branch block type.   IMPRESSION:  1. Coronary artery disease status post remote PCI of the circumflex artery      and status post bypass surgery August 2006.  2. Ischemic cardiomyopathy with the ejection fraction 34%.  3. Prostate cancer.  4. Hyperlipidemia.   RECOMMENDATIONS:  1. I think Mr. Caba is doing well.  We will plan to get an      echocardiogram to re-evaluate his left  ventricular function and see if      he needs any additional therapy in terms of either Ace-inhibitors or an      implantable-cardioverter-defibrillator.  2. We will also get a fasting lipid and lipid profile as well as CBC and      BMP.  We will decide if he needs any adjustment to his cholesterol      medicine.  His last lipid profile showed a low HDL.  If these tests are      satisfactory we will plan on seeing him back in followup in a year.                               Bruce Elvera Lennox Juanda Chance, MD, [facility]    BRB/MedQ  DD:  01/02/2006  DT:  01/03/2006  Job #:  [id number]   cc:   Ramon Dredge L. Juanetta Gosling, MD

## 2010-09-29 NOTE — Assessment & Plan Note (Signed)
[facility]                           ELECTROPHYSIOLOGY OFFICE NOTE   NAME:BOLICKClarke, Peretz                       MRN:          [id number]  DATE:03/13/2006                            DOB:          08-10-1926    Mr. Free was seen today in the clinic on March 13, 2006 for a wound  check of his newly implanted Medtronic model [id number] Maximo. Date of implant  was February 28, 2006 for ischemic cardiomyopathy. On interrogation of his  device today, his battery voltage was 3.19 with a charge time of 8.10  seconds. R waves measured 16.2 millivolts with a ventricular pacing  threshold of 1 volt at 0.2 msec and a ventricular lead impedence of 424  ohms. Shock impedance was 47 ohms. There were no episodes since implant  date, no changes were made in his parameters today. His Steri-Strips were  removed, his wound was without redness or edema and he will have a followup  appointment in January with Dr. Ladona Ridgel.      Altha Harm, LPN    ______________________________  Doylene Canning. Ladona Ridgel, MD   PO/MedQ  DD: 03/13/2006  DT: 03/13/2006  Job #: [id number]

## 2010-09-29 NOTE — Op Note (Signed)
[name], FORTENBERRY NO.:  [id number]   MEDICAL RECORD NO.:  [id number]          PATIENT TYPE:  INP   LOCATION:  2306                         FACILITY:  [facility]   PHYSICIAN:  Sheliah Plane, MD    DATE OF BIRTH:  1927/02/11   DATE OF PROCEDURE:  12/12/2004  DATE OF DISCHARGE:                                 OPERATIVE REPORT   PREOPERATIVE DIAGNOSIS:  Coronary occlusive disease.   POSTOPERATIVE DIAGNOSIS:  Coronary occlusive disease.   PROCEDURE:  Coronary artery bypass grafting x3 with the left internal  mammary to the left anterior descending coronary artery, reverse saphenous  vein graft to the circumflex coronary artery, reverse saphenous vein graft  to the distal right coronary artery with endovein harvesting.   SURGEON:  Sheliah Plane, MD.   FIRST ASSISTANT:  Rowe Clack, P.A.-C.   BRIEF HISTORY:  The patient is a 77 year old male who was being evaluated  for treatment of prostate cancer, positive cardiac clearance was arranged by  the urologist. A stress test was carried out which was positive leading to  cardiac catheterization which demonstrated subtotal occlusion of the  circumflex and right coronary artery with 60-70% stenosis of the LAD.  Because of the patient's severe three-vessel coronary artery disease,  coronary artery bypass grafting was recommended. The patient agreed and  signed the informed consented.   DESCRIPTION OF PROCEDURE:  With Swan-Ganz and arterial line monitors in  place, the patient underwent general endotracheal anesthesia without  incident. The skin of the chest and the legs was prepped with Betadine,  draped in the usual sterile manner. Using the guidant endovein harvesting  system, the vein was harvested from the left thigh and was of good quality  and caliber. A small incision was made at the right knee; however, no  satisfactory vein could be located in the right. Mediastinotomy was  performed, the left internal  mammary artery was dissected down as a pedicle  graft, the distal artery was divided and had good free flow. Pericardium was  opened and overall ventricular function appeared preserved. The patient was  systemically heparinized. The ascending aorta and the right atrium were  cannulated and aortic root vent cardioplegia needle was introduced into the  ascending aorta. The patient was placed on cardiopulmonary bypass 2.4 liters  per minute per m2 squared, sites of anastomosis were selected and dissected  out of the epicardium. The patient's body temperature was cooled to 30  degrees, aortic cross clamp was applied. 500 mL of cold blood testing  cardioplegia was administered with rapid diastolic arrest of the heart.  Myocardial septal temperature was monitored at the cross clamp. Attention  was turned first to the circumflex coronary artery which was opened and  admitted a 1.5 mm probe. Using a running 7-0 Prolene, a distal anastomosis  was performed. Attention was then turned to the very distal right coronary  artery right at the junction of the PD and PL branches. The vessel was open  and was somewhat thickened. Using a running 7-0 Prolene, a distal  anastomosis was performed with a segment of  reverse saphenous vein graft.  Attention was then turned to the left anterior descending coronary artery  which was opened in the mid portion and admitted a 1.5 mm probe. Using a  running 8-0 Prolene, the left internal mammary artery was anastomosed to the  left anterior descending coronary artery. With the release of the bulldog on  the mammary artery, there was a rise in myocardial septal temperature.  Additional cold blood cardioplegia was administered with the cross clamp  still in place. Two punch aortotomies were performed and each of the two  vein grafts were anastomosed to the ascending aorta. The air was evacuated  from the ascending aorta and the aortic cross clamp was removed. The patient   spontaneously converted to a sinus rhythm. Preoperatively, he had been  estimated to have an ejection fraction of 30-40%. We started on low dose  dopamine and was then ventilated and weaned from cardiopulmonary bypass  without difficulty, remained hemodynamically stable. He was decannulated in  the usual fashion, protamine sulfate was administered. With the operative  field hemostatic, two atrial and two ventricular pacing wires were applied.  Graft markers were applied. A left pleural tube and two mediastinal tubes  were left in place. The sternum was closed with a #6 stainless steel wire,  fascia closed with interrupted #0 Vicryl, running 3-0 Vicryl in the  subcutaneous tissue, 4-0 subcuticular stitch in the skin edges, dry  dressings applied. Sponge and needle count was reported as correct at  completion of the procedure. The patient tolerated the procedure without  obvious complications and was transferred to the Surgical Intensive Care  Unit. Total pump time was 101 minutes.       EG/MEDQ  D:  12/18/2004  T:  12/18/2004  Job:  [id number]   cc:   Charlies Constable, M.D. [facility]  [address]  [address]  [place]  [place] [address]

## 2010-09-29 NOTE — Op Note (Signed)
NAMEJAYDON, [name]                ACCOUNT NO.:  [id number]   MEDICAL RECORD NO.:  [id number]          PATIENT TYPE:  AMB   LOCATION:  DAY                          FACILITY:  [facility]   PHYSICIAN:  Ronald L. Earlene Plater, M.D.  DATE OF BIRTH:  1926-06-10   DATE OF PROCEDURE:  03/09/2005  DATE OF DISCHARGE:                                 OPERATIVE REPORT   PREOPERATIVE DIAGNOSIS:  Adenocarcinoma of the prostate.   POSTOPERATIVE DIAGNOSIS:  Adenocarcinoma of the prostate.   PROCEDURE:  Cryosurgical ablation of the prostate, cystourethroscopy and  suprapubic tube placement.   SURGEON:  Lucrezia Starch. Earlene Plater, M.D.   ANESTHESIA:  General endotracheal.   ESTIMATED BLOOD LOSS:  Minimal.   TUBES:  [address] [place] [employer] suprapubic tube.   COMPLICATIONS:  None.   INDICATIONS FOR PROCEDURE:  Mr. Fatheree is a very nice 77 year old white male  who originally presented with a PSA of 5.26 and a free to totalratio of  22.4. He had undergone biopsy of the prostate originally but had atypia and  a repeat biopsy revealed left needle biopsies which revealed a Gleason score  of 8 which was 4+4 adenocarcinoma. He underwent a metastatic workup  consisting of a bone scan and a CT pelvis with contrast that revealed no  evidence of metastatic disease. He was subsequently found to have coronary  artery disease and underwent a coronary artery bypass. While he was waiting  for treatment of his prostate cancer, he underwent Lupron injection. He had  his first Lupron injection in March, the second in July. He is recovered  from his coronary artery bypass. He had radiotherapeutic evaluation. He  elected to proceed with cryosurgical ablation. After understanding the  risks, benefits, and alternatives, he has elected to proceed.   DESCRIPTION OF PROCEDURE:  The patient was placed in the supine position and  after proper general endotracheal anesthesia was placed in the dorsal  lithotomy position, prepped and draped  with Betadine in a sterile fashion.  Cystourethroscopy was performed with an Olympus flexible cystourethroscope.  He was noted to have mild trilobar hypertrophy. The bladder was actually  smooth walled and efflux of clear urine was noted from the normally placed  ureteral orifices bilaterally. Under direct vision, a 14 [place] fader-tipped  [employer] suprapubic tube was placed and secured in place utilizing  a cope system. The cystoscope was removed, the 16 French Foley catheter was  inserted and then cryosurgical probes were placed utilizing both the  physical and electronic grid in the pattern as noted in the record. A total  of 3 anterior ice rods were placed, second row channel 2 were also ice rods,  total of 3. An ice rod was in channel 4 on the right posterolateral and an  ice rod was in 5 left posterolateral and an ice seed was midline.  Thermistors were placed in the external sphincter and Denonvilliers fascia  and again is documented. Freeze thaw cycles were then performed as is  documented in the record. We felt we had an excellent ice ball, the  thermistors remained well above 0  degrees centigrade, in fact each remained  above 10 degrees centigrade. We felt like we had an excellent coverage of  the entire prostate with preservation of the Denonvilliers' fascia and  external sphincter. There was active thaw for 10 minutes and when we could  visualize the prostate again a second freeze thaw cycle was completed in a  similar manner. An active thaw was performed until the needles could be  visualized, these were removed along with the thermistors. The urethral  warmer was maintained an extra 20 minutes. The wound was dressed sterilely.  All devices were removed. A suprapubic tube was opened to drainage, the  patient was taken to the recovery room stable.      Ronald L. Earlene Plater, M.D.  Electronically Signed     RLD/MEDQ  D:  03/09/2005  T:  03/09/2005  Job:  [id number]

## 2010-09-29 NOTE — Op Note (Signed)
[name], [name]                ACCOUNT NO.:  [id number]   MEDICAL RECORD NO.:  [id number]          PATIENT TYPE:  AMB   LOCATION:  DAY                           FACILITY:  [facility]   PHYSICIAN:  Lionel December, M.D.    DATE OF BIRTH:  1926-12-21   DATE OF PROCEDURE:  07/04/2005  DATE OF DISCHARGE:                                 OPERATIVE REPORT   PROCEDURE:  Colonoscopy with polypectomy.   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 He has done well since then. He is returning for  surveillance exam. Family history is significant for colorectal carcinoma in  his father at age 60. Procedure and risks were reviewed with the patient and  informed consent was obtained.   MEDICATIONS FOR CONSCIOUS SEDATION:  Demerol 50 mg IV, Versed 2 mg IV.   FINDINGS:  Procedure performed in endoscopy suite. The patient's vital signs  and O2 saturations were monitored during the procedure and remained stable.  The patient was placed in the left lateral position and rectal examination  performed. No abnormality noted on external or digital exam. Olympus video  scope was placed in the rectum and advanced under vision into sigmoid colon  and beyond. Preparation was excellent. Scope was advanced into the cecum  which was identified by appendiceal orifice and ileocecal valve. Picture  taken for the record. As the scope was withdrawn, colonic mucosa was  carefully examined. There were small polyps, 6 to 7 mm, at proximal sigmoid  colon, both of which were snared and retrieved for histologic examination  (in one container). Two tiny polyps were noted at rectosigmoid junction  which were coagulated and two more at rectum which were also coagulated.  Scope was retroflexed to examine anorectal junction which was unremarkable.  Endoscope was straightened and withdrawn. The patient  tolerated the  procedure well.   FINAL DIAGNOSIS:  Two 6 to 7 mm polyps snared from proximal sigmoid colon.  Four tiny polyps coagulated, two at rectosigmoid junction and two at rectum.   RECOMMENDATIONS:  1.  He will resume his aspirin in five days.  2.  He will return for repeat exam in three years from now.      Lionel December, M.D.  Electronically Signed     NR/MEDQ  D:  07/04/2005  T:  07/05/2005  Job:  [id number]   cc:   Ramon Dredge L. Juanetta Gosling, M.D.  Fax: [phone]

## 2010-09-29 NOTE — Cardiovascular Report (Signed)
NAME:  [name], [name] NO.:  [id number]   MEDICAL RECORD NO.:  [id number]          PATIENT TYPE:  AMB   LOCATION:                               FACILITY:  [facility]   PHYSICIAN:  Charlies Constable, M.D. [facility] DATE OF BIRTH:  09-04-1926   DATE OF PROCEDURE:  DATE OF DISCHARGE:                              CARDIAC CATHETERIZATION   CLINICAL HISTORY:  [name] is 77 years old and 12 years ago had  angioplasty performed of the circumflex artery.  He had not been seen in  follow-up in a number of years and recently was found to have a PSA and had  a prostate biopsy and was found to have cancer and was scheduled for cryo  therapy.  We saw him in preoperative evaluation.  Did give a history of  shortness of breath with exertion.  We did a Cardiolite scan.  He had very  limited exercise tolerance and had an ejection fraction of 32% down from a  previous ejection fraction of 50% a number of years ago.  He had  inferolateral scar with some peri-infarction ischemia.  For this reason we  brought him in for evaluation angiography.  He also has hypertension,  hyperlipidemia.   PROCEDURE:  The procedure was performed via the right femoral artery using  arterial sheath and 4-French preformed coronary catheters.  A front wall  arterial puncture was performed and Omnipaque contrast was used.  Subclavian  injection was performed to assess the internal mammary artery for its  suitability for bypass grafting.  Distal aortogram was performed to rule out  abdominal aortic aneurysm.  After completion of the left heart  catheterization after we broke down we made the decision to perform a right  heart catheterization so we reprepped his left groin and performed right  heart catheterization via the left femoral vein.  We passed the Swan-Ganz  catheter to the pulmonary artery.   Patient tolerated the procedure well and left the laboratory in satisfactory  condition.   RESULTS:  Left main  coronary artery:  Left main coronary artery was free of  significant disease.   Left anterior descending artery:  Left anterior descending artery gave rise  to two diagonal branches and two septal perforators.  The LAD was irregular  and there was 50% segmental narrowing in the ostium in the proximal portion  before the first diagonal branch.   Circumflex artery:  The circumflex artery was completely occluded after a  first early marginal branch.  The distal circumflex artery filled via  collaterals and consisted of a second marginal branch and a posterolateral  branch.  These appeared to be fairly good caliber vessels.   Right coronary artery:  Right coronary artery was completely occluded  proximally.  Distal right coronary artery consisted of posterior descending  and a posterolateral branch which filled via collaterals from the left  coronary artery.   LEFT VENTRICULOGRAM:  The left ventriculogram performed in the RAO  projection showed akinesis of the inferobasal segment out to the mid  inferior wall.  The rest of the ventricle had  global hypokinesis.  The  estimated ejection fraction was 30%.   The left ventriculogram performed in the LAO projection showed akinesis of  the inferolateral wall and hypokinesis of posterior wall.   DISTAL AORTOGRAM:  A distal aortogram was performed which showed patent  renal arteries and no significant aortoiliac obstruction.   HEMODYNAMIC DATA:  The right atrial pressure was 17 mean.  The pulmonary  artery pressure was 58/26 with a mean of 42.  Pulmonary wedge pressure was  21-23.  Left ventricular pressure was 152/20 and the aortic pressure was  152/74 with a mean of 106.   The cardiac output/cardiac index was 2.6/1.8 L/minute/sq m by Fick.   CONCLUSIONS:  Severe coronary artery disease status post remote angioplasty  of the circumflex artery with 50% narrowing in the proximal portion of the  left anterior descending artery, total occlusion  of the proximal circumflex  artery with collateral to the distal vessel from the left anterior  descending, and total occlusion of the right coronary artery with  collaterals to the distal vessel from septal perforators and atrial  collaterals with inferobasal wall akinesis and global hypokinesis with an  estimated ejection fraction of 30%.   RECOMMENDATIONS:  The patient has severe coronary disease and ischemic  cardiomyopathy.  By history he is limited by shortness of breath and  probably has class III symptoms and his treadmill test confirms this.  He  will be a high surgical risk for general anesthesia and I will have to  discuss the options with Dr. Earlene Plater.  The optimal management of his ischemic  cardiomyopathy would probably include initiation of medical therapy with  diuretics, beta blockers, and ACE inhibitors but he might be a candidate  unless his age and comorbidities prohibit this for surgical  revascularization and possibly an ICD and Bi-V pacer.  Decisions will be  very difficult and I will review this with my colleagues before we make any  final recommendations.           ______________________________  Charlies Constable, M.D. [facility]     BB/MEDQ  D:  11/24/2004  T:  11/24/2004  Job:  [id number]   cc:   Ramon Dredge L. Juanetta Gosling, M.D.  [address]  [place]  [place] [address]  Fax: [phone]   [facility]   Lucrezia Starch. Earlene Plater, M.D.  [address]  [place]  [place] [address]  Fax: [phone]

## 2010-09-29 NOTE — Discharge Summary (Signed)
NAMEJEB, SCHLOEMER NO.:  [id number]   MEDICAL RECORD NO.:  [id number]          PATIENT TYPE:  INP   LOCATION:  2022                         FACILITY:  [facility]   PHYSICIAN:  Sheliah Plane, MD    DATE OF BIRTH:  1926-09-05   DATE OF ADMISSION:  12/12/2004  DATE OF DISCHARGE:                                 DISCHARGE SUMMARY   PRIMARY DIAGNOSIS:  Coronary artery disease.   IN HOSPITAL DIAGNOSIS:  Postoperative ileus.   SECONDARY DIAGNOSES:  1.  History of nephrolithiasis.  2.  History of hernia, status post repair.  3.  History of edema.  4.  History of tobacco use.  5.  Prostate cancer.   ALLERGIES:  HE HAS NO KNOWN DRUG ALLERGIES.   OPERATIONS AND PROCEDURES IN HOSPITAL:  Coronary artery bypass grafting x 3,  with left internal mammary artery to left anterior descending coronary  artery, reverse saphenous vein graft to the circumflex coronary artery,  reverse saphenous vein graft to the distal right coronary artery, endovein  harvesting.   HISTORY/PHYSICAL AND HOSPITAL COURSE:  [name] is a 77 year old male  without any symptoms of chest pain who presented with rising prostatic  specific antigen and was diagnosed with prostate cancer in the process of  being evaluated for this.  He was referred for cardiology evaluation.  A  stress test was performed by Dr. Juanda Chance which then led to cardiac  catheterization, which demonstrated significant three-vessel coronary artery  disease.  The patient does have a history of myocardial infarction in the  past, in 1994, and concurrently had angioplasty in the circumflex performed.  He had two secondary diagnoses of myocardial infarction dating back to 1994,  status post angioplasty to the circumflex artery.  The patient also had a  history of hyperlipidemia as well as tobacco use which he quit back in 1994.  He denies hypertension, diabetes mellitus, previous stroke, claudication or  renal insufficiency.  Dr.  Tyrone Sage was consulted to evaluate the patient.  Dr. Tyrone Sage discussed with [name] undergoing coronary artery bypass  grafting for the blockages.  He discussed the risks and benefits of this  procedure.  The patient acknowledged understanding and wished to proceed.  Surgery was scheduled for December 12, 2004.  For details of the patient's past  medical history and physical examination, please see dictated history and  physical.   HOSPITAL COURSE:  [name] was taken to the operating room on December 12, 2004, where he underwent coronary artery bypass grafting x 3 with left  internal mammary artery to left anterior descending coronary artery, reverse  saphenous vein graft to the circumflex coronary artery, reverse saphenous  vein graft to the distal right coronary artery.  Note endovein harvesting  done.  The patient tolerated this procedure well and was transferred up to  the intensive care unit in stable condition.  Immediately postoperatively,  the patient seemed to be hemodynamically stable with hematocrit of 28%.  He  was extubated the evening of surgery.  Postoperative day 1, the patient  remained hemodynamically stable with H&H being 9.2  and 27.  Creatinine was  stable at 1.1.  The patient was alert and oriented x 3, neuro intact.  Chest  x-ray was stable.  Heart rate was normal sinus rhythm.  Chest tubes were  DC'd.  The patient did have volume overload and was started on diuretics.  Patient kept in unit.  Postoperative day 2, vital signs were seen to be  stable.  The patient was complaining of abdominal distention.  Bowel sounds  x 4 were noted.  Abdomen was soft and nontender on palpation.  Abdominal  films were ordered.  The patient remained in normal sinus rhythm.  The  incision seemed to be dry, intact and healing without minimal drainage,  intact and healing well.  Later that evening, postoperative day 2, the  patient had an episode of vomiting.  Abdominal films showed  colonic ileus.  The patient was placed n.p.o. and NG tube was placed.  Postoperative day 3,  abdominal distention improved greatly.  The patient was feeling much better.  Bowel sounds x 4, soft and nontender on palpation.  Vital signs remained  stable.  The patient remained afebrile.  He remained in normal sinus rhythm.  NG tube remained in place.  Postoperative day 4, the patient was without  flatus and bowel movement.  NG tube remained in place.  Ileus was improving.  The patient continued IV fluids.  The patient was out of bed, ambulating  well, remained in normal sinus rhythm.  Incisions continued to heal well.  The patient was continued on diuretics.  Postoperative day 5, the patient  was feeling better.  Had good flatus, no bowel movement.  Abdominal exam  remained benign.  NG tube was DC'd as well as Foley.  The patient was  started on clear liquids.  He remained in normal sinus rhythm, he remained  hemodynamically stable, H&H 8.8 and 25.  Creatinine was stable at 0.9.  The  patient was transferred out to 2000.  Postoperative day 6, the patient was  improving.  He had two bowel movements overnight.  The patient remained  afebrile.  Diet was advanced as tolerated.  The patient continued to  ambulate three times daily and continued pulmonary toilet.  Postoperative  day 7, the patient was tolerating full diet without nausea, vomiting.  Abdominal exam was benign.  He remained in normal sinus rhythm, although he  did have bouts of sinus tachycardia.  Medication was dosed appropriately.  The incisions were healing well.  The patient was discharged home on  postoperative day 8 in stable condition.  He was in normal sinus rhythm and  incisions were dry and intact.  He had bowel sounds x 4, soft and nontender  abdominal exam on palpation.  Positive bowel movement, tolerating a regular  diet without complaints.  FOLLOWUP APPOINTMENTS:  A followup appointment was scheduled with Dr.  Tyrone Sage  for January 11, 2005, at 11:40 a.m.  The patient will follow up with  Dr. Juanda Chance in two weeks prior to that appointment.  He will have a P-A and  lateral chest x-ray at that appointment which he will bring to Dr.  Dennie Maizes appointment.   DISCHARGE INSTRUCTIONS:  [name] received instructions on diet, activity  and incisional care.  He was told no driving for two weeks and no heavy  lifting over 10 pounds.  He was told to wash incisions using soap and water.  He was to contact the office if he developed any drainage or opening from  his incision sites.  The patient acknowledged understanding.   DIET:  He was educated on diet to be low fat, low salt.   ACTIVITY:  He was told to ambulate 3-4 times per day and advance as  tolerated.  He was also told to continue his exercises.  He again  acknowledged understanding.   DISCHARGE MEDICATIONS:  1.  Aspirin 325 mg p.o. daily.  2.  Lipitor 20 mg p.o. at bedtime.  3.  Lopressor 75 mg p.o. twice daily.  4.  Fosamax 70 mg weekly.  5.  Monthly Allegra shots.  6.  Oxycodone 5 mg, 1-2 tabs p.o. every 4-6 hours as needed for pain.      Theda Belfast, [name]      Sheliah Plane, MD  Electronically Signed    KMD/MEDQ  D:  12/19/2004  T:  12/19/2004  Job:  [id number]   cc:   Charlies Constable, M.D. [facility]  [address]  [address]  [place]  [place] [address]

## 2010-10-19 ENCOUNTER — Encounter: Payer: Medicare Other | Admitting: *Deleted

## 2010-10-24 ENCOUNTER — Encounter: Payer: Self-pay | Admitting: *Deleted

## 2010-10-27 ENCOUNTER — Other Ambulatory Visit: Payer: Self-pay | Admitting: Internal Medicine

## 2010-10-27 ENCOUNTER — Ambulatory Visit (INDEPENDENT_AMBULATORY_CARE_PROVIDER_SITE_OTHER): Payer: Medicare Other | Admitting: *Deleted

## 2010-10-27 DIAGNOSIS — I428 Other cardiomyopathies: Secondary | ICD-10-CM

## 2010-10-27 DIAGNOSIS — Z95810 Presence of automatic (implantable) cardiac defibrillator: Secondary | ICD-10-CM

## 2010-10-30 NOTE — Progress Notes (Signed)
icd remote check  

## 2010-11-03 ENCOUNTER — Encounter: Payer: Self-pay | Admitting: *Deleted

## 2011-01-08 ENCOUNTER — Other Ambulatory Visit: Payer: Self-pay | Admitting: Cardiology

## 2011-01-25 ENCOUNTER — Ambulatory Visit: Payer: Medicare Other | Admitting: Cardiology

## 2011-01-29 ENCOUNTER — Encounter: Payer: Self-pay | Admitting: Cardiology

## 2011-01-30 ENCOUNTER — Encounter: Payer: Self-pay | Admitting: Cardiology

## 2011-01-30 ENCOUNTER — Ambulatory Visit (INDEPENDENT_AMBULATORY_CARE_PROVIDER_SITE_OTHER): Payer: Medicare Other | Admitting: Cardiology

## 2011-01-30 DIAGNOSIS — E785 Hyperlipidemia, unspecified: Secondary | ICD-10-CM

## 2011-01-30 DIAGNOSIS — I5022 Chronic systolic (congestive) heart failure: Secondary | ICD-10-CM

## 2011-01-30 DIAGNOSIS — I25810 Atherosclerosis of coronary artery bypass graft(s) without angina pectoris: Secondary | ICD-10-CM

## 2011-01-30 MED ORDER — SPIRONOLACTONE 25 MG PO TABS: 25.0000 mg | ORAL_TABLET | Freq: Every day | ORAL | Status: DC

## 2011-01-30 NOTE — Patient Instructions (Addendum)
Increase spironolactone to 25mg  daily.  Decrease aspirin to 162mg  daily--you can take one-half of a 325mg  aspirin daily or you can take two 81mg  aspirin tablets daily.  Have fasting lab done in 1 week--you have the order-lipid profile/liver profile/BMP/BNP. Please fax the results to [phone] Dr Shirlee Latch.  Your physician wants you to follow-up in: 6 months with Dr Shirlee Latch.(March 2013).You will receive a reminder letter in the mail two months in advance. If you don't receive a letter, please call our office to schedule the follow-up appointment.

## 2011-01-30 NOTE — Assessment & Plan Note (Signed)
Check lipids/LFTs, goal LDL < 70.  

## 2011-01-30 NOTE — Assessment & Plan Note (Signed)
No exertional chest pain.  Continue ASA (can decrease to 162 mg daily), statin, ARB, beta blocker.

## 2011-01-30 NOTE — Progress Notes (Signed)
PCP: Dr. Juanetta Gosling  77 yo with history of CAD s/p CABG and ischemic cardiomyopathy presents for cardiology followup.  He walks [place] with no exertional dyspnea on flat ground. He is limited more by his hip pain than by exertional dyspnea. He is able to climb a flight of steps without problems. He gardens in the summer. No chest pain.  Systolic blood pressure is running in the 110s when he checks at home.   Labs (10/11): K 4.2, creatinine 1.3, BNP 62  Labs (2/12): K 4.7, creatinine 1.2, BNP 49, LDL 66, HDL 35  ECG: NSR, RBBB, LAFB.   Allergies (verified):  1) ! Codeine   Past Medical History:  1. CAD: s/p CABG 8/06 with LIMA-LAD, SVG-CFX, SVG-distal RCA.  2. NEPHROLITHIASIS, HX OF (ICD-V13.01)  3. SYSTOLIC HEART FAILURE, CHRONIC (ICD-428.22): Ischemic cardiomyopathy with last echo (2011) showing EF 35-40%, mild LV hypertrophy, inferoposterior akinesis, grade I diastolic dysfunction, biatrial enlargement, mild mitral regurgitation. Patient has [employer] ICD  4. HYPERTENSION, UNSPECIFIED (ICD-401.9)  5. PROSTATE CANCER (ICD-185): s/p cryosurgical ablation.  6. HYPERLIPIDEMIA-MIXED (ICD-272.4)  7. Abnormal chest x-ray with questionable nodule.  8. [employer] ICD   Family History:  CAD   Social History:  He lives with his wife in [place]. 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   Current Outpatient Prescriptions  Medication Sig Dispense Refill  . acetaminophen (TYLENOL) 500 MG tablet Take 500 mg by mouth every 6 (six) hours as needed.        . carvedilol (COREG) 25 MG tablet Take 12.5 mg by mouth 2 (two) times daily with a meal.        . cetirizine (ZYRTEC) 10 MG tablet Take 10 mg by mouth daily.        . Cholecalciferol (VITAMIN D3) 1000 UNITS CAPS Take by mouth.        . hydrochlorothiazide 25 MG tablet Take 1 tablet (25 mg total) by mouth daily.  30 tablet  6  . Misc  Natural Products (OSTEO BI-FLEX ADV DOUBLE ST) CAPS Take by mouth.        . Multiple Vitamins-Minerals (OSTEO COMPLEX PO) Take by mouth.        . simvastatin (ZOCOR) 40 MG tablet Take 1 tablet (40 mg total) by mouth every evening.  30 tablet  11  . valsartan (DIOVAN) 160 MG tablet Take 160 mg by mouth daily.        . vitamin C (ASCORBIC ACID) 500 MG tablet Take 500 mg by mouth daily.        Marland Kitchen DISCONTD: aspirin 325 MG tablet Take 325 mg by mouth daily.        Marland Kitchen DISCONTD: spironolactone (ALDACTONE) 25 MG tablet TAKE 1/2 TABLET BY MOUTH ONCE DAILY  15 tablet  6  . aspirin EC 81 MG tablet Take two tablets daily--this will be 162mg  daily      . spironolactone (ALDACTONE) 25 MG tablet Take 1 tablet (25 mg total) by mouth daily.  30 tablet  6    BP 106/64  Pulse 66  Ht 5\' 4"  (1.626 m)  Wt 195 lb 12.8 oz (88.814 kg)  BMI 33.61 kg/m2 General: NAD Neck: No JVD, no thyromegaly or thyroid nodule.  Lungs: Clear to auscultation bilaterally with normal respiratory effort. CV: Nondisplaced PMI.  Heart regular S1/S2, no S3/S4, 1/6 SEM RUSB.  No peripheral edema.  No carotid bruit.  Normal pedal pulses.  Abdomen: Soft, nontender, no hepatosplenomegaly, no distention.  Neurologic: Alert and oriented x 3.  Psych: Normal affect. Extremities: No clubbing or cyanosis.

## 2011-01-30 NOTE — Assessment & Plan Note (Signed)
Stable NYHA class II symptoms.  Last echo with EF 35-40%.  Has [employer] ICD.  Continue current doses of valsartan and Coreg.  I will increase spironolactone to 25 mg daily with BMET/BNP in 7 days.

## 2011-02-01 ENCOUNTER — Other Ambulatory Visit: Payer: Self-pay | Admitting: Internal Medicine

## 2011-02-01 ENCOUNTER — Encounter: Payer: Self-pay | Admitting: Internal Medicine

## 2011-02-01 ENCOUNTER — Ambulatory Visit (INDEPENDENT_AMBULATORY_CARE_PROVIDER_SITE_OTHER): Payer: Medicare Other | Admitting: *Deleted

## 2011-02-01 DIAGNOSIS — I428 Other cardiomyopathies: Secondary | ICD-10-CM

## 2011-02-09 NOTE — Progress Notes (Signed)
icd checked by remote  

## 2011-02-12 ENCOUNTER — Other Ambulatory Visit: Payer: Self-pay | Admitting: Cardiology

## 2011-02-12 LAB — HEPATIC FUNCTION PANEL
ALT: 10 U/L (ref 0–53)
AST: 14 U/L (ref 0–37)
Albumin: 4.5 g/dL (ref 3.5–5.2)
Alkaline Phosphatase: 43 U/L (ref 39–117)
Total Bilirubin: 0.6 mg/dL (ref 0.3–1.2)
Total Protein: 7.2 g/dL (ref 6.0–8.3)

## 2011-02-12 LAB — BASIC METABOLIC PANEL
CO2: 27 mEq/L (ref 19–32)
Calcium: 9.7 mg/dL (ref 8.4–10.5)
Creat: 1.38 mg/dL — ABNORMAL HIGH (ref 0.50–1.35)
Sodium: 140 mEq/L (ref 135–145)

## 2011-02-12 LAB — LIPID PANEL
HDL: 31 mg/dL — ABNORMAL LOW (ref >39)
Total CHOL/HDL Ratio: 4.8 Ratio
Triglycerides: 239 mg/dL — ABNORMAL HIGH (ref <150)

## 2011-02-12 LAB — BRAIN NATRIURETIC PEPTIDE: Brain Natriuretic Peptide: 42.7 pg/mL (ref 0.0–100.0)

## 2011-02-14 ENCOUNTER — Encounter: Payer: Self-pay | Admitting: *Deleted

## 2011-02-27 ENCOUNTER — Telehealth: Payer: Self-pay | Admitting: *Deleted

## 2011-02-27 DIAGNOSIS — I5022 Chronic systolic (congestive) heart failure: Secondary | ICD-10-CM

## 2011-02-27 NOTE — Telephone Encounter (Signed)
Notes Recorded by Jacqlyn Krauss, RN on 02/27/2011 at 1:49 PM I discussed with pt. He will decrease HCTZ to 12.5mg  daily. We discussed low K diet. He will return for [facility] 03/13/11. ------  Notes Recorded by Marca Ancona, MD on 02/26/2011 at 10:22 PM Decrease HCTZ to 12.5 mg daily, low K diet. Good lipids. Repeat BMET in 2 wks.

## 2011-03-13 ENCOUNTER — Other Ambulatory Visit (INDEPENDENT_AMBULATORY_CARE_PROVIDER_SITE_OTHER): Payer: Medicare Other | Admitting: *Deleted

## 2011-03-13 DIAGNOSIS — I5022 Chronic systolic (congestive) heart failure: Secondary | ICD-10-CM

## 2011-03-13 LAB — BASIC METABOLIC PANEL
BUN: 35 mg/dL — ABNORMAL HIGH (ref 6–23)
Calcium: 9.4 mg/dL (ref 8.4–10.5)
Creatinine, Ser: 1.3 mg/dL (ref 0.4–1.5)
GFR: 54.67 mL/min — ABNORMAL LOW (ref >60.00)

## 2011-05-03 ENCOUNTER — Other Ambulatory Visit: Payer: Self-pay | Admitting: Internal Medicine

## 2011-05-03 ENCOUNTER — Ambulatory Visit (INDEPENDENT_AMBULATORY_CARE_PROVIDER_SITE_OTHER): Payer: Medicare Other | Admitting: *Deleted

## 2011-05-03 ENCOUNTER — Encounter: Payer: Self-pay | Admitting: Internal Medicine

## 2011-05-03 DIAGNOSIS — I428 Other cardiomyopathies: Secondary | ICD-10-CM

## 2011-05-11 LAB — REMOTE ICD DEVICE
BATTERY VOLTAGE: 3 V
RV LEAD AMPLITUDE: 7.2 mv
RV LEAD IMPEDENCE ICD: 456 Ohm
TOT-0006: 20120920000000
TZAT-0001SLOWVT: 2
TZAT-0004SLOWVT: 8
TZAT-0004SLOWVT: 8
TZAT-0005FASTVT: 88 pct
TZAT-0005SLOWVT: 84 pct
TZAT-0005SLOWVT: 91 pct
TZAT-0011FASTVT: 10 ms
TZAT-0011SLOWVT: 10 ms
TZAT-0012FASTVT: 200 ms
TZAT-0012SLOWVT: 200 ms
TZAT-0012SLOWVT: 200 ms
TZAT-0013SLOWVT: 2
TZAT-0013SLOWVT: 2
TZAT-0018FASTVT: NEGATIVE
TZON-0003FASTVT: 240 ms
TZON-0003SLOWVT: 370 ms
TZON-0004SLOWVT: 16
TZST-0001FASTVT: 3
TZST-0001FASTVT: 5
TZST-0001SLOWVT: 3
TZST-0001SLOWVT: 6
TZST-0003FASTVT: 25 J
TZST-0003FASTVT: 35 J
TZST-0003SLOWVT: 35 J
TZST-0003SLOWVT: 35 J
VENTRICULAR PACING ICD: 0 pct

## 2011-05-11 NOTE — Progress Notes (Signed)
ICD remote 

## 2011-05-30 ENCOUNTER — Encounter: Payer: Self-pay | Admitting: *Deleted

## 2011-06-07 DIAGNOSIS — J329 Chronic sinusitis, unspecified: Secondary | ICD-10-CM | POA: Diagnosis not present

## 2011-07-26 ENCOUNTER — Ambulatory Visit (INDEPENDENT_AMBULATORY_CARE_PROVIDER_SITE_OTHER): Payer: Medicare Other | Admitting: Cardiology

## 2011-07-26 ENCOUNTER — Encounter: Payer: Self-pay | Admitting: Cardiology

## 2011-07-26 VITALS — BP 130/66 | HR 62 | Ht 64.0 in | Wt 194.8 lb

## 2011-07-26 DIAGNOSIS — E785 Hyperlipidemia, unspecified: Secondary | ICD-10-CM

## 2011-07-26 DIAGNOSIS — R0989 Other specified symptoms and signs involving the circulatory and respiratory systems: Secondary | ICD-10-CM | POA: Diagnosis not present

## 2011-07-26 DIAGNOSIS — I5022 Chronic systolic (congestive) heart failure: Secondary | ICD-10-CM | POA: Diagnosis not present

## 2011-07-26 DIAGNOSIS — I2510 Atherosclerotic heart disease of native coronary artery without angina pectoris: Secondary | ICD-10-CM

## 2011-07-26 DIAGNOSIS — I6529 Occlusion and stenosis of unspecified carotid artery: Secondary | ICD-10-CM | POA: Insufficient documentation

## 2011-07-26 LAB — LIPID PANEL
Cholesterol: 132 mg/dL (ref 0–200)
HDL: 38.5 mg/dL — ABNORMAL LOW (ref >39.00)
LDL Cholesterol: 60 mg/dL (ref 0–99)
Triglycerides: 167 mg/dL — ABNORMAL HIGH (ref 0.0–149.0)
VLDL: 33.4 mg/dL (ref 0.0–40.0)

## 2011-07-26 LAB — BASIC METABOLIC PANEL
CO2: 28 mEq/L (ref 19–32)
Chloride: 106 mEq/L (ref 96–112)
Potassium: 5.3 mEq/L — ABNORMAL HIGH (ref 3.5–5.1)
Sodium: 140 mEq/L (ref 135–145)

## 2011-07-26 NOTE — Assessment & Plan Note (Signed)
Check lipids with goal LDL < 70.   

## 2011-07-26 NOTE — Progress Notes (Signed)
PCP: Dr. Juanetta Gosling  78 yo with history of CAD s/p CABG and ischemic cardiomyopathy presents for cardiology followup.  He walks daily for 1/2 - 1 mile in the mall or outdoors with no exertional dyspnea on flat ground. Mild dyspnea with a flight of steps.  He is limited more by his hip pain than by exertional dyspnea. No chest pain.  BP has been well-controlled when he checks it at home.    Labs (10/11): K 4.2, creatinine 1.3, BNP 62  Labs (2/12): K 4.7, creatinine 1.2, BNP 49, LDL 66, HDL 35 Labs (10/12): K 4.7, creatinine 1.3, LDL 71, HDl 31  ECG: NSR, RBBB, LAFB.   Allergies (verified):  1) ! Codeine   Past Medical History:  1. CAD: s/p CABG 8/06 with LIMA-LAD, SVG-CFX, SVG-distal RCA.  2. NEPHROLITHIASIS, HX OF (ICD-V13.01)  3. SYSTOLIC HEART FAILURE, CHRONIC (ICD-428.22): Ischemic cardiomyopathy with last echo (2011) showing EF 35-40%, mild LV hypertrophy, inferoposterior akinesis, grade I diastolic dysfunction, biatrial enlargement, mild mitral regurgitation. Patient has Medtronic ICD  4. HYPERTENSION, UNSPECIFIED (ICD-401.9)  5. PROSTATE CANCER (ICD-185): s/p cryosurgical ablation.  6. HYPERLIPIDEMIA-MIXED (ICD-272.4)  7. Abnormal chest x-ray with questionable nodule.  8. 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   Current Outpatient Prescriptions  Medication Sig Dispense Refill  . acetaminophen (TYLENOL) 500 MG tablet Take 500 mg by mouth every 6 (six) hours as needed.        Marland Kitchen aspirin EC 81 MG tablet Take two tablets daily--this will be 162mg  daily      . carvedilol (COREG) 25 MG tablet Take 12.5 mg by mouth 2 (two) times daily with a meal.        . cetirizine (ZYRTEC) 10 MG tablet Take 10 mg by mouth as needed.       . Cholecalciferol (VITAMIN D3) 1000 UNITS CAPS Take by mouth.        .  hydrochlorothiazide (HYDRODIURIL) 25 MG tablet Take 1/2 tablet daily      . Misc Natural Products (OSTEO BI-FLEX ADV DOUBLE ST) CAPS Take by mouth.        . simvastatin (ZOCOR) 40 MG tablet Take 1 tablet (40 mg total) by mouth every evening.  30 tablet  11  . spironolactone (ALDACTONE) 25 MG tablet Take 1 tablet (25 mg total) by mouth daily.  30 tablet  6  . valsartan (DIOVAN) 160 MG tablet Take 160 mg by mouth daily.        . vitamin C (ASCORBIC ACID) 500 MG tablet Take 500 mg by mouth daily.          BP 130/66  Pulse 62  Ht 5\' 4"  (1.626 m)  Wt 194 lb 12.8 oz (88.361 kg)  BMI 33.44 kg/m2 General: NAD Neck: No JVD, no thyromegaly or thyroid nodule.  Lungs: Clear to auscultation bilaterally with normal respiratory effort. CV: Nondisplaced PMI.  Heart regular S1/S2, no S3/S4, 1/6 SEM RUSB.  No peripheral edema.  Right carotid bruit.  Normal pedal pulses.  Abdomen: Soft, nontender, no hepatosplenomegaly, no distention.  Neurologic: Alert and oriented x 3.  Psych: Normal affect. Extremities: No clubbing or cyanosis.

## 2011-07-26 NOTE — Assessment & Plan Note (Signed)
Right carotid bruit.  Will get carotid dopplers.

## 2011-07-26 NOTE — Assessment & Plan Note (Signed)
No exertional chest pain.  Continue ASA 81, statin, ARB, beta blocker.   

## 2011-07-26 NOTE — Assessment & Plan Note (Addendum)
Stable NYHA class II symptoms.  Last echo with EF 35-40%.  Has Medtronic ICD.  Continue current doses of valsartan, spironolactone, and Coreg.  K has been high at times in the past.  I will check BMET and BNP today.  He will followup with me in 4 months and will need repeat BMET at that time.  He does not look particularly volume overloaded today so will continue current dose of HCTZ.  No Lasix.

## 2011-07-26 NOTE — Patient Instructions (Signed)
Your physician recommends that you return for a FASTING lipid profile /BMET 414.01  428.22  Your physician has requested that you have a carotid duplex. This test is an ultrasound of the carotid arteries in your neck. It looks at blood flow through these arteries that supply the brain with blood. Allow one hour for this exam. There are no restrictions or special instructions. In the next week or so.  Your physician wants you to follow-up in: 4 months with Dr Shirlee Latch. (July 2013). You will receive a reminder letter in the mail two months in advance. If you don't receive a letter, please call our office to schedule the follow-up appointment.   Your physician recommends that you return for lab work in: 4 months when you see Dr Frutoso Chase  414.01  428.22

## 2011-07-27 ENCOUNTER — Other Ambulatory Visit: Payer: Self-pay | Admitting: *Deleted

## 2011-07-27 DIAGNOSIS — I5022 Chronic systolic (congestive) heart failure: Secondary | ICD-10-CM

## 2011-07-27 MED ORDER — SPIRONOLACTONE 25 MG PO TABS: ORAL_TABLET | ORAL | Status: DC

## 2011-08-01 DIAGNOSIS — I10 Essential (primary) hypertension: Secondary | ICD-10-CM | POA: Diagnosis not present

## 2011-08-01 DIAGNOSIS — I2510 Atherosclerotic heart disease of native coronary artery without angina pectoris: Secondary | ICD-10-CM | POA: Diagnosis not present

## 2011-08-01 DIAGNOSIS — E785 Hyperlipidemia, unspecified: Secondary | ICD-10-CM | POA: Diagnosis not present

## 2011-08-03 ENCOUNTER — Encounter (INDEPENDENT_AMBULATORY_CARE_PROVIDER_SITE_OTHER): Payer: Medicare Other

## 2011-08-03 DIAGNOSIS — I6529 Occlusion and stenosis of unspecified carotid artery: Secondary | ICD-10-CM

## 2011-08-03 DIAGNOSIS — R0989 Other specified symptoms and signs involving the circulatory and respiratory systems: Secondary | ICD-10-CM

## 2011-08-07 ENCOUNTER — Encounter: Payer: Self-pay | Admitting: Internal Medicine

## 2011-08-07 ENCOUNTER — Other Ambulatory Visit: Payer: Medicare Other

## 2011-08-07 ENCOUNTER — Ambulatory Visit (INDEPENDENT_AMBULATORY_CARE_PROVIDER_SITE_OTHER): Payer: Medicare Other | Admitting: Internal Medicine

## 2011-08-07 VITALS — BP 120/64 | HR 66 | Wt 193.4 lb

## 2011-08-07 DIAGNOSIS — I2589 Other forms of chronic ischemic heart disease: Secondary | ICD-10-CM | POA: Diagnosis not present

## 2011-08-07 DIAGNOSIS — I428 Other cardiomyopathies: Secondary | ICD-10-CM | POA: Insufficient documentation

## 2011-08-07 DIAGNOSIS — Z95810 Presence of automatic (implantable) cardiac defibrillator: Secondary | ICD-10-CM | POA: Diagnosis not present

## 2011-08-07 DIAGNOSIS — I5022 Chronic systolic (congestive) heart failure: Secondary | ICD-10-CM | POA: Diagnosis not present

## 2011-08-07 DIAGNOSIS — I2510 Atherosclerotic heart disease of native coronary artery without angina pectoris: Secondary | ICD-10-CM | POA: Diagnosis not present

## 2011-08-07 LAB — ICD DEVICE OBSERVATION
BATTERY VOLTAGE: 3 V
BRDY-0002RV: 40 {beats}/min
CHARGE TIME: 7.97 s
RV LEAD AMPLITUDE: 5.9 mv
RV LEAD IMPEDENCE ICD: 480 Ohm
TZAT-0001FASTVT: 1
TZAT-0001SLOWVT: 1
TZAT-0001SLOWVT: 2
TZAT-0004SLOWVT: 8
TZAT-0004SLOWVT: 8
TZAT-0005FASTVT: 88 pct
TZAT-0013SLOWVT: 2
TZAT-0013SLOWVT: 2
TZAT-0018FASTVT: NEGATIVE
TZAT-0019FASTVT: 8 V
TZAT-0020SLOWVT: 1.6 ms
TZAT-0020SLOWVT: 1.6 ms
TZON-0011AFLUTTER: 70
TZST-0001FASTVT: 2
TZST-0001FASTVT: 6
TZST-0001SLOWVT: 3
TZST-0001SLOWVT: 5
TZST-0003FASTVT: 35 J
TZST-0003FASTVT: 35 J
TZST-0003SLOWVT: 35 J
VF: 0

## 2011-08-07 LAB — BASIC METABOLIC PANEL
BUN: 30 mg/dL — ABNORMAL HIGH (ref 6–23)
CO2: 29 mEq/L (ref 19–32)
Chloride: 102 mEq/L (ref 96–112)
Creatinine, Ser: 1.1 mg/dL (ref 0.4–1.5)
Glucose, Bld: 85 mg/dL (ref 70–99)

## 2011-08-07 NOTE — Patient Instructions (Signed)
Your physician wants you to follow-up in: 12 months with Dr Taylor You will receive a reminder letter in the mail two months in advance. If you don't receive a letter, please call our office to schedule the follow-up appointment.   Remote monitoring is used to monitor your Pacemaker of ICD from home. This monitoring reduces the number of office visits required to check your device to one time per year. It allows us to keep an eye on the functioning of your device to ensure it is working properly. You are scheduled for a device check from home on 11/08/2011. You may send your transmission at any time that day. If you have a wireless device, the transmission will be sent automatically. After your physician reviews your transmission, you will receive a postcard with your next transmission date.   

## 2011-08-07 NOTE — Assessment & Plan Note (Signed)
His device is working normally. We will plan to recheck in several months. 

## 2011-08-07 NOTE — Progress Notes (Signed)
HPI [name] returns today for followup. He is a pleasant 78 yo man with a h/o an ICM, chronic systolic heart failure, s/p ICD implant.  In the interim, he denies chest pain or shortness of breath. No ICD shock and no syncope. Minimal peripheral edema. Allergies  Allergen Reactions  . Codeine      Current Outpatient Prescriptions  Medication Sig Dispense Refill  . acetaminophen (TYLENOL) 500 MG tablet Take 500 mg by mouth every 6 (six) hours as needed.        Marland Kitchen aspirin EC 81 MG tablet Take two tablets daily--this will be 162mg  daily      . carvedilol (COREG) 25 MG tablet Take 12.5 mg by mouth 2 (two) times daily with a meal.        . cetirizine (ZYRTEC) 10 MG tablet Take 10 mg by mouth as needed.       . Cholecalciferol (VITAMIN D3) 1000 UNITS CAPS Take by mouth.        . hydrochlorothiazide (HYDRODIURIL) 25 MG tablet Take 1/2 tablet daily      . Misc Natural Products (OSTEO BI-FLEX ADV DOUBLE ST) CAPS Take by mouth.        . simvastatin (ZOCOR) 40 MG tablet Take 1 tablet (40 mg total) by mouth every evening.  30 tablet  11  . spironolactone (ALDACTONE) 25 MG tablet Take half tablet by mouth daily  30 tablet  6  . valsartan (DIOVAN) 160 MG tablet Take 160 mg by mouth daily.        . vitamin C (ASCORBIC ACID) 500 MG tablet Take 500 mg by mouth daily.           Past Medical History  Diagnosis Date  . Coronary artery disease     s/p CABG 8/06 LIMA-LAD, SVG-CFX, SVG-distal RCA  . Nephrolithiasis     hx of  . CHF (congestive heart failure)     systolic  . Hypertension   . Prostate cancer   . Hyperlipidemia     mixed  . ICD (implantable cardiac defibrillator) in place   . BPH (benign prostatic hypertrophy)     ROS:   All systems reviewed and negative except as noted in the HPI.   Past Surgical History  Procedure Date  . Prostate surgery   . Coronary artery bypass graft     8/06 with left anterior descending artery, saphenous vein graft to circumflex, saphenous vein graft  to distal right coronary artery  . Colonoscopy   . Polypectomy   . Hernia repair      Family History  Problem Relation Age of Onset  . Coronary artery disease       History   Social History  . Marital Status: Married    Spouse Name: N/A    Number of Children: N/A  . Years of Education: N/A   Occupational History  . Retired    Social History Main Topics  . Smoking status: Former Smoker    Types: Cigarettes    Quit date: 05/14/1992  . Smokeless tobacco: Never Used  . Alcohol Use: No  . Drug Use: No  . Sexually Active: Not on file   Other Topics Concern  . Not on file   Social History Narrative  . No narrative on file     BP 120/64  Pulse 66  Wt 87.726 kg (193 lb 6.4 oz)  Physical Exam:  Well appearing elderly man, NAD HEENT: Unremarkable Neck:  No JVD, no thyromegally Lungs:  Clear with no wheezes, rales, or rhonchi. HEART:  Regular rate rhythm, no murmurs, no rubs, no clicks Abd:  soft, positive bowel sounds, no organomegally, no rebound, no guarding Ext:  2 plus pulses, no edema, no cyanosis, no clubbing Skin:  No rashes no nodules Neuro:  CN II through XII intact, motor grossly intact  DEVICE  Normal device function.  See PaceArt for details.    Assess/Plan:

## 2011-08-07 NOTE — Assessment & Plan Note (Signed)
He denies anginal symptoms. He will continue his current medications. I've encouraged him to increase his physical activity.

## 2011-08-07 NOTE — Assessment & Plan Note (Signed)
His symptoms remain class II and are well controlled. I've asked him to maintain a low-sodium diet and continue his current medical therapy.

## 2011-08-09 ENCOUNTER — Other Ambulatory Visit: Payer: Medicare Other

## 2011-08-24 ENCOUNTER — Other Ambulatory Visit: Payer: Self-pay | Admitting: Cardiology

## 2011-08-24 NOTE — Telephone Encounter (Signed)
Refilled spironolactone

## 2011-09-10 ENCOUNTER — Other Ambulatory Visit: Payer: Self-pay | Admitting: Cardiology

## 2011-11-08 ENCOUNTER — Encounter: Payer: Self-pay | Admitting: Internal Medicine

## 2011-11-08 ENCOUNTER — Ambulatory Visit (INDEPENDENT_AMBULATORY_CARE_PROVIDER_SITE_OTHER): Payer: Medicare Other | Admitting: *Deleted

## 2011-11-08 DIAGNOSIS — I428 Other cardiomyopathies: Secondary | ICD-10-CM

## 2011-11-08 DIAGNOSIS — Z95810 Presence of automatic (implantable) cardiac defibrillator: Secondary | ICD-10-CM

## 2011-11-12 ENCOUNTER — Other Ambulatory Visit (INDEPENDENT_AMBULATORY_CARE_PROVIDER_SITE_OTHER): Payer: Medicare Other

## 2011-11-12 DIAGNOSIS — I5022 Chronic systolic (congestive) heart failure: Secondary | ICD-10-CM

## 2011-11-12 LAB — BASIC METABOLIC PANEL
BUN: 39 mg/dL — ABNORMAL HIGH (ref 6–23)
GFR: 52.74 mL/min — ABNORMAL LOW (ref >60.00)
Glucose, Bld: 127 mg/dL — ABNORMAL HIGH (ref 70–99)
Potassium: 5.1 mEq/L (ref 3.5–5.1)

## 2011-11-12 LAB — REMOTE ICD DEVICE
BRDY-0002RV: 40 {beats}/min
CHARGE TIME: 8.14 s
DEV-0020ICD: NEGATIVE
RV LEAD IMPEDENCE ICD: 456 Ohm
TZAT-0001FASTVT: 1
TZAT-0004FASTVT: 8
TZAT-0004SLOWVT: 8
TZAT-0004SLOWVT: 8
TZAT-0005FASTVT: 88 pct
TZAT-0012SLOWVT: 200 ms
TZAT-0012SLOWVT: 200 ms
TZAT-0019SLOWVT: 8 V
TZAT-0020SLOWVT: 1.6 ms
TZAT-0020SLOWVT: 1.6 ms
TZON-0003SLOWVT: 370 ms
TZON-0011AFLUTTER: 70
TZST-0001FASTVT: 2
TZST-0001FASTVT: 5
TZST-0001SLOWVT: 3
TZST-0001SLOWVT: 5
TZST-0003FASTVT: 35 J
TZST-0003FASTVT: 35 J
TZST-0003SLOWVT: 25 J
TZST-0003SLOWVT: 35 J
VENTRICULAR PACING ICD: 0 pct

## 2011-11-13 ENCOUNTER — Other Ambulatory Visit: Payer: Self-pay | Admitting: *Deleted

## 2011-11-13 DIAGNOSIS — I5022 Chronic systolic (congestive) heart failure: Secondary | ICD-10-CM

## 2011-11-20 ENCOUNTER — Encounter: Payer: Self-pay | Admitting: Cardiology

## 2011-11-20 ENCOUNTER — Ambulatory Visit (INDEPENDENT_AMBULATORY_CARE_PROVIDER_SITE_OTHER): Payer: Medicare Other | Admitting: Cardiology

## 2011-11-20 VITALS — BP 122/62 | HR 62 | Ht 64.0 in | Wt 192.0 lb

## 2011-11-20 DIAGNOSIS — E785 Hyperlipidemia, unspecified: Secondary | ICD-10-CM | POA: Diagnosis not present

## 2011-11-20 DIAGNOSIS — I25810 Atherosclerosis of coronary artery bypass graft(s) without angina pectoris: Secondary | ICD-10-CM | POA: Diagnosis not present

## 2011-11-20 DIAGNOSIS — I6529 Occlusion and stenosis of unspecified carotid artery: Secondary | ICD-10-CM

## 2011-11-20 DIAGNOSIS — I5022 Chronic systolic (congestive) heart failure: Secondary | ICD-10-CM

## 2011-11-20 MED ORDER — SPIRONOLACTONE 25 MG PO TABS: ORAL_TABLET | ORAL | Status: DC

## 2011-11-20 MED ORDER — HYDROCHLOROTHIAZIDE 25 MG PO TABS: ORAL_TABLET | ORAL | Status: DC

## 2011-11-20 NOTE — Assessment & Plan Note (Signed)
No exertional chest pain.  Continue ASA 81, statin, ARB, beta blocker.

## 2011-11-20 NOTE — Assessment & Plan Note (Signed)
Stable NYHA class II symptoms.  Last echo with EF 35-40%.  Has Medtronic ICD.  Continue current doses of valsartan, spironolactone, and Coreg.  K has been upper normal and creatinine mildly elevated.  He does not look particularly volume overloaded today so will continue current dose of HCTZ.  No Lasix.  I will get a BMET at the end of this month to make sure that K and creatinine are not trending up.

## 2011-11-20 NOTE — Progress Notes (Signed)
Patient ID: [name], male   DOB: 03/15/1927, 78 y.o.   MRN: [id number] PCP: Dr. Juanetta Gosling  78 yo with history of CAD s/p CABG and ischemic cardiomyopathy presents for cardiology followup.  He walks daily for 1/2 - 1 mile in the mall or outdoors with no exertional dyspnea on flat ground. Mild dyspnea with a flight of steps.  He is limited more by his hip pain than by exertional dyspnea. No chest pain.    Labs (10/11): K 4.2, creatinine 1.3, BNP 62  Labs (2/12): K 4.7, creatinine 1.2, BNP 49, LDL 66, HDL 35 Labs (10/12): K 4.7, creatinine 1.3, LDL 71, HDl 31 Labs (3/13): LDL 60, HDL 39 Labs (7/13): K 5.1, creatinine 1.4  Allergies (verified):  1) ! Codeine   Past Medical History:  1. CAD: s/p CABG 8/06 with LIMA-LAD, SVG-CFX, SVG-distal RCA.  2. NEPHROLITHIASIS, HX OF (ICD-V13.01)  3. SYSTOLIC HEART FAILURE, CHRONIC (ICD-428.22): Ischemic cardiomyopathy with last echo (2011) showing EF 35-40%, mild LV hypertrophy, inferoposterior akinesis, grade I diastolic dysfunction, biatrial enlargement, mild mitral regurgitation. Patient has Medtronic ICD  4. HYPERTENSION, UNSPECIFIED (ICD-401.9)  5. PROSTATE CANCER (ICD-185): s/p cryosurgical ablation.  6. HYPERLIPIDEMIA-MIXED (ICD-272.4)  7. Abnormal chest x-ray with questionable nodule.  8. Medtronic ICD  9. Carotid stenosis: Dopplers 3/13 with 40-59% RICA stenosis.   Family History:  CAD   Social History:  He lives with his wife in [place]. 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 acetaminophen (TYLENOL) 500 MG tablet Take 500 mg by mouth every 6 (six) hours as needed.        Marland Kitchen aspirin EC 81 MG tablet Take two tablets daily--this will be 162mg  daily      . carvedilol (COREG) 25 MG tablet Take 12.5 mg by mouth 2 (two) times daily with a meal.        . cetirizine (ZYRTEC) 10 MG tablet Take 10 mg by mouth as needed.       . Cholecalciferol (VITAMIN D3)  1000 UNITS CAPS Take by mouth.        . hydrochlorothiazide (HYDRODIURIL) 25 MG tablet Take 1/2 tablet daily  15 tablet  11  . Misc Natural Products (OSTEO BI-FLEX ADV DOUBLE ST) CAPS Take by mouth.        . simvastatin (ZOCOR) 40 MG tablet take 1 tablet every evening  30 tablet  5  . spironolactone (ALDACTONE) 25 MG tablet Take half tablet by mouth daily  15 tablet  11  . valsartan (DIOVAN) 160 MG tablet Take 160 mg by mouth daily.        . vitamin C (ASCORBIC ACID) 500 MG tablet Take 500 mg by mouth daily.        Marland Kitchen DISCONTD: hydrochlorothiazide (HYDRODIURIL) 25 MG tablet Take 1/2 tablet daily      . DISCONTD: spironolactone (ALDACTONE) 25 MG tablet Take half tablet by mouth daily  30 tablet  6  . DISCONTD: spironolactone (ALDACTONE) 25 MG tablet take 1 tablet once daily  30 tablet  6    BP 122/62  Pulse 62  Ht 5\' 4"  (1.626 m)  Wt 87.091 kg (192 lb)  BMI 32.96 kg/m2  SpO2 97% General: NAD Neck: No JVD, no thyromegaly or thyroid nodule.  Lungs: Clear to auscultation bilaterally with normal respiratory effort. CV: Nondisplaced PMI.  Heart regular S1/S2, no S3/S4, 1/6 SEM RUSB.  No peripheral edema.  Right carotid  bruit.  Normal pedal pulses.  Abdomen: Soft, nontender, no hepatosplenomegaly, no distention.  Neurologic: Alert and oriented x 3.  Psych: Normal affect. Extremities: No clubbing or cyanosis.

## 2011-11-20 NOTE — Assessment & Plan Note (Signed)
LDL at goal in 3/13 (< 70).

## 2011-11-20 NOTE — Patient Instructions (Addendum)
Keep your lab appt already scheduled for July 30,2013.  Your physician recommends that you schedule a follow-up appointment in: 4 months with Dr Shirlee Latch.

## 2011-11-20 NOTE — Assessment & Plan Note (Signed)
40-59% RICA stenosis.  Repeat carotid dopplers in 3/14.

## 2011-11-30 DIAGNOSIS — I259 Chronic ischemic heart disease, unspecified: Secondary | ICD-10-CM | POA: Diagnosis not present

## 2011-11-30 DIAGNOSIS — I10 Essential (primary) hypertension: Secondary | ICD-10-CM | POA: Diagnosis not present

## 2011-11-30 DIAGNOSIS — E785 Hyperlipidemia, unspecified: Secondary | ICD-10-CM | POA: Diagnosis not present

## 2011-12-07 ENCOUNTER — Encounter: Payer: Self-pay | Admitting: *Deleted

## 2011-12-11 ENCOUNTER — Other Ambulatory Visit (INDEPENDENT_AMBULATORY_CARE_PROVIDER_SITE_OTHER): Payer: Medicare Other

## 2011-12-11 DIAGNOSIS — I5022 Chronic systolic (congestive) heart failure: Secondary | ICD-10-CM | POA: Diagnosis not present

## 2011-12-11 LAB — BASIC METABOLIC PANEL
BUN: 29 mg/dL — ABNORMAL HIGH (ref 6–23)
Calcium: 9.2 mg/dL (ref 8.4–10.5)
Chloride: 105 mEq/L (ref 96–112)
Creatinine, Ser: 1.4 mg/dL (ref 0.4–1.5)

## 2012-02-11 ENCOUNTER — Ambulatory Visit (INDEPENDENT_AMBULATORY_CARE_PROVIDER_SITE_OTHER): Payer: Medicare Other | Admitting: *Deleted

## 2012-02-11 DIAGNOSIS — I428 Other cardiomyopathies: Secondary | ICD-10-CM | POA: Diagnosis not present

## 2012-02-11 LAB — REMOTE ICD DEVICE
BATTERY VOLTAGE: 2.95 V
RV LEAD AMPLITUDE: 6.4 mv
RV LEAD IMPEDENCE ICD: 504 Ohm
TOT-0006: 20130627000000
TZAT-0001SLOWVT: 1
TZAT-0001SLOWVT: 2
TZAT-0004SLOWVT: 8
TZAT-0004SLOWVT: 8
TZAT-0005FASTVT: 88 pct
TZAT-0005SLOWVT: 84 pct
TZAT-0005SLOWVT: 91 pct
TZAT-0011FASTVT: 10 ms
TZAT-0011SLOWVT: 10 ms
TZAT-0011SLOWVT: 10 ms
TZAT-0012FASTVT: 200 ms
TZAT-0013SLOWVT: 2
TZAT-0013SLOWVT: 2
TZON-0003FASTVT: 240 ms
TZON-0004SLOWVT: 16
TZON-0011AFLUTTER: 70
TZST-0001FASTVT: 3
TZST-0001FASTVT: 5
TZST-0001FASTVT: 6
TZST-0001SLOWVT: 3
TZST-0001SLOWVT: 6
TZST-0003FASTVT: 25 J
TZST-0003FASTVT: 35 J
TZST-0003FASTVT: 35 J
TZST-0003SLOWVT: 35 J

## 2012-02-12 NOTE — Progress Notes (Signed)
Remote defib check  

## 2012-02-20 ENCOUNTER — Encounter: Payer: Self-pay | Admitting: *Deleted

## 2012-02-27 ENCOUNTER — Encounter: Payer: Self-pay | Admitting: Internal Medicine

## 2012-02-27 DIAGNOSIS — Z23 Encounter for immunization: Secondary | ICD-10-CM | POA: Diagnosis not present

## 2012-03-15 ENCOUNTER — Other Ambulatory Visit: Payer: Self-pay | Admitting: Cardiology

## 2012-03-19 ENCOUNTER — Encounter: Payer: Self-pay | Admitting: Cardiology

## 2012-03-19 ENCOUNTER — Ambulatory Visit (INDEPENDENT_AMBULATORY_CARE_PROVIDER_SITE_OTHER): Payer: Medicare Other | Admitting: Cardiology

## 2012-03-19 ENCOUNTER — Ambulatory Visit: Payer: Medicare Other | Admitting: Cardiology

## 2012-03-19 VITALS — BP 110/62 | HR 66 | Ht 64.0 in | Wt 188.0 lb

## 2012-03-19 DIAGNOSIS — I6529 Occlusion and stenosis of unspecified carotid artery: Secondary | ICD-10-CM | POA: Diagnosis not present

## 2012-03-19 DIAGNOSIS — I25810 Atherosclerosis of coronary artery bypass graft(s) without angina pectoris: Secondary | ICD-10-CM

## 2012-03-19 DIAGNOSIS — I5022 Chronic systolic (congestive) heart failure: Secondary | ICD-10-CM

## 2012-03-19 DIAGNOSIS — R0602 Shortness of breath: Secondary | ICD-10-CM | POA: Diagnosis not present

## 2012-03-19 LAB — LIPID PANEL: HDL: 31.5 mg/dL — ABNORMAL LOW (ref >39.00)

## 2012-03-19 LAB — BASIC METABOLIC PANEL
CO2: 28 mEq/L (ref 19–32)
Chloride: 106 mEq/L (ref 96–112)
Potassium: 4.9 mEq/L (ref 3.5–5.1)
Sodium: 140 mEq/L (ref 135–145)

## 2012-03-19 LAB — BRAIN NATRIURETIC PEPTIDE: Pro B Natriuretic peptide (BNP): 46 pg/mL (ref 0.0–100.0)

## 2012-03-19 NOTE — Progress Notes (Signed)
Patient ID: [name], male   DOB: 17-May-1926, 78 y.o.   MRN: [id number] PCP: Dr. Juanetta Gosling  78 yo with history of CAD s/p CABG and ischemic cardiomyopathy presents for cardiology followup.  He has been doing well since I last saw him.  He does not get short of breath when he walks.  He does get mild shortness of breath with more heavy exertion such as working in the garden and bending over a lot or when doing heavier housework like washing windows.  No chest pain.   ECG: NSR, RBBB, LAFB  Labs (10/11): K 4.2, creatinine 1.3, BNP 62  Labs (2/12): K 4.7, creatinine 1.2, BNP 49, LDL 66, HDL 35 Labs (10/12): K 4.7, creatinine 1.3, LDL 71, HDl 31 Labs (3/13): LDL 60, HDL 39 Labs (7/13): K 5.1=>4.5, creatinine 1.4  Allergies (verified):  1) ! Codeine   Past Medical History:  1. CAD: s/p CABG 8/06 with LIMA-LAD, SVG-CFX, SVG-distal RCA.  2. NEPHROLITHIASIS, HX OF (ICD-V13.01)  3. SYSTOLIC HEART FAILURE, CHRONIC (ICD-428.22): Ischemic cardiomyopathy with last echo (2011) showing EF 35-40%, mild LV hypertrophy, inferoposterior akinesis, grade I diastolic dysfunction, biatrial enlargement, mild mitral regurgitation. Patient has Medtronic ICD  4. HYPERTENSION, UNSPECIFIED (ICD-401.9)  5. PROSTATE CANCER (ICD-185): s/p cryosurgical ablation.  6. HYPERLIPIDEMIA-MIXED (ICD-272.4)  7. Abnormal chest x-ray with questionable nodule.  8. Medtronic ICD  9. Carotid stenosis: Dopplers 3/13 with 40-59% RICA stenosis.   Family History:  CAD   Social History:  He lives with his wife in [place]. 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 acetaminophen (TYLENOL) 500 MG tablet Take 500 mg by mouth every 6 (six) hours as needed.        Marland Kitchen aspirin EC 81 MG tablet Take two tablets daily--this will be 162mg  daily      . carvedilol (COREG) 25 MG tablet Take 12.5 mg by mouth 2 (two) times daily with a meal.        . cetirizine  (ZYRTEC) 10 MG tablet Take 10 mg by mouth as needed.       . Cholecalciferol (VITAMIN D3) 1000 UNITS CAPS Take by mouth.        . hydrochlorothiazide (HYDRODIURIL) 25 MG tablet Take 1/2 tablet daily  15 tablet  11  . Misc Natural Products (OSTEO BI-FLEX ADV DOUBLE ST) CAPS Take by mouth.        . simvastatin (ZOCOR) 40 MG tablet take 1 tablet every evening  30 tablet  5  . spironolactone (ALDACTONE) 25 MG tablet Take half tablet by mouth daily  15 tablet  11  . valsartan (DIOVAN) 160 MG tablet Take 160 mg by mouth daily.        . vitamin C (ASCORBIC ACID) 500 MG tablet Take 500 mg by mouth daily.          BP 110/62  Pulse 66  Ht 5\' 4"  (1.626 m)  Wt 188 lb (85.276 kg)  BMI 32.27 kg/m2 General: NAD Neck: No JVD, no thyromegaly or thyroid nodule.  Lungs: Clear to auscultation bilaterally with normal respiratory effort. CV: Nondisplaced PMI.  Heart regular S1/S2, no S3/S4, 1/6 SEM RUSB.  No peripheral edema.  Right carotid bruit.  Normal pedal pulses.  Abdomen: Soft, nontender, no hepatosplenomegaly, no distention.  Neurologic: Alert and oriented x 3.  Psych: Normal affect. Extremities: No clubbing or cyanosis.   Assessment/Plan:  CAD, AUTOLOGOUS BYPASS GRAFT No exertional  chest pain. Continue ASA 81, statin, ARB, beta blocker.  Carotid stenosis  40-59% RICA stenosis. Repeat carotid dopplers in 3/14. HYPERLIPIDEMIA-MIXED  Check lipids with goal LDL < 70.  SYSTOLIC HEART FAILURE, CHRONIC  Stable NYHA class II symptoms. Last echo with EF 35-40%. Has Medtronic ICD. Continue current doses of valsartan, spironolactone, and Coreg. K has been upper normal and creatinine mildly elevated. He does not look particularly volume overloaded today so will continue current dose of HCTZ. No Lasix. I will get a BMET and BNP today.   Marca Ancona 03/19/2012 1:48 PM

## 2012-03-19 NOTE — Patient Instructions (Addendum)
Your physician recommends that you have  lab work today--lipid profile/BMET/BNP.  Your physician recommends that you schedule a follow-up appointment in: 4 months with Dr Shirlee Latch.

## 2012-03-21 ENCOUNTER — Telehealth: Payer: Self-pay | Admitting: Cardiology

## 2012-03-21 NOTE — Telephone Encounter (Signed)
Patient aware of lab results.

## 2012-03-21 NOTE — Telephone Encounter (Signed)
Pt returning nurse call, he can be reached at [phone]

## 2012-04-01 DIAGNOSIS — I259 Chronic ischemic heart disease, unspecified: Secondary | ICD-10-CM | POA: Diagnosis not present

## 2012-04-01 DIAGNOSIS — E785 Hyperlipidemia, unspecified: Secondary | ICD-10-CM | POA: Diagnosis not present

## 2012-04-01 DIAGNOSIS — I10 Essential (primary) hypertension: Secondary | ICD-10-CM | POA: Diagnosis not present

## 2012-04-01 DIAGNOSIS — I509 Heart failure, unspecified: Secondary | ICD-10-CM | POA: Diagnosis not present

## 2012-04-07 DIAGNOSIS — I2510 Atherosclerotic heart disease of native coronary artery without angina pectoris: Secondary | ICD-10-CM | POA: Diagnosis not present

## 2012-04-07 DIAGNOSIS — J440 Chronic obstructive pulmonary disease with acute lower respiratory infection: Secondary | ICD-10-CM | POA: Diagnosis not present

## 2012-04-07 DIAGNOSIS — I10 Essential (primary) hypertension: Secondary | ICD-10-CM | POA: Diagnosis not present

## 2012-04-15 DIAGNOSIS — C61 Malignant neoplasm of prostate: Secondary | ICD-10-CM | POA: Diagnosis not present

## 2012-04-22 DIAGNOSIS — C61 Malignant neoplasm of prostate: Secondary | ICD-10-CM | POA: Diagnosis not present

## 2012-04-22 DIAGNOSIS — N200 Calculus of kidney: Secondary | ICD-10-CM | POA: Diagnosis not present

## 2012-05-19 ENCOUNTER — Ambulatory Visit (INDEPENDENT_AMBULATORY_CARE_PROVIDER_SITE_OTHER): Payer: Medicare Other | Admitting: *Deleted

## 2012-05-19 DIAGNOSIS — Z95810 Presence of automatic (implantable) cardiac defibrillator: Secondary | ICD-10-CM

## 2012-05-19 DIAGNOSIS — I428 Other cardiomyopathies: Secondary | ICD-10-CM | POA: Diagnosis not present

## 2012-05-21 LAB — REMOTE ICD DEVICE
DEV-0020ICD: NEGATIVE
RV LEAD AMPLITUDE: 6 mv
TZAT-0001SLOWVT: 1
TZAT-0001SLOWVT: 2
TZAT-0005SLOWVT: 84 pct
TZAT-0005SLOWVT: 91 pct
TZAT-0012FASTVT: 200 ms
TZAT-0013SLOWVT: 2
TZAT-0013SLOWVT: 2
TZAT-0018FASTVT: NEGATIVE
TZAT-0018SLOWVT: NEGATIVE
TZAT-0018SLOWVT: NEGATIVE
TZAT-0019FASTVT: 8 V
TZAT-0019SLOWVT: 8 V
TZAT-0019SLOWVT: 8 V
TZAT-0020FASTVT: 1.6 ms
TZAT-0020SLOWVT: 1.6 ms
TZON-0004SLOWVT: 16
TZON-0005SLOWVT: 12
TZON-0008FASTVT: 0 ms
TZON-0008SLOWVT: 0 ms
TZST-0001FASTVT: 4
TZST-0001FASTVT: 6
TZST-0001SLOWVT: 3
TZST-0001SLOWVT: 6
TZST-0003FASTVT: 25 J
TZST-0003FASTVT: 35 J
TZST-0003SLOWVT: 35 J
TZST-0003SLOWVT: 35 J

## 2012-05-27 ENCOUNTER — Encounter: Payer: Self-pay | Admitting: *Deleted

## 2012-05-27 DIAGNOSIS — Z961 Presence of intraocular lens: Secondary | ICD-10-CM | POA: Diagnosis not present

## 2012-06-02 ENCOUNTER — Encounter: Payer: Self-pay | Admitting: Internal Medicine

## 2012-07-21 ENCOUNTER — Ambulatory Visit (INDEPENDENT_AMBULATORY_CARE_PROVIDER_SITE_OTHER): Payer: Medicare Other | Admitting: Cardiology

## 2012-07-21 ENCOUNTER — Encounter: Payer: Self-pay | Admitting: Cardiology

## 2012-07-21 VITALS — BP 128/70 | HR 61 | Ht 64.0 in | Wt 192.0 lb

## 2012-07-21 DIAGNOSIS — I6523 Occlusion and stenosis of bilateral carotid arteries: Secondary | ICD-10-CM

## 2012-07-21 DIAGNOSIS — I25810 Atherosclerosis of coronary artery bypass graft(s) without angina pectoris: Secondary | ICD-10-CM | POA: Diagnosis not present

## 2012-07-21 DIAGNOSIS — I5022 Chronic systolic (congestive) heart failure: Secondary | ICD-10-CM | POA: Diagnosis not present

## 2012-07-21 DIAGNOSIS — I658 Occlusion and stenosis of other precerebral arteries: Secondary | ICD-10-CM

## 2012-07-21 MED ORDER — LOSARTAN POTASSIUM 50 MG PO TABS: ORAL_TABLET | ORAL | Status: DC

## 2012-07-21 MED ORDER — CARVEDILOL 12.5 MG PO TABS: ORAL_TABLET | ORAL | Status: DC

## 2012-07-21 NOTE — Progress Notes (Signed)
Patient ID: [name], male   DOB: 1926/11/24, 79 y.o.   MRN: [id number] PCP: Dr. Juanetta Gosling  79 yo with history of CAD s/p CABG and ischemic cardiomyopathy presents for cardiology followup.  He has been doing well since I last saw him.  He does not get short of breath when he walks.  He does get mild shortness of breath with more heavy exertion.  No chest pain. Weight has been stable.  His insurance is no longer covering Diovan, and he wants to switch to a cheaper alternative.    ECG: NSR, RBBB, LAFB  Labs (10/11): K 4.2, creatinine 1.3, BNP 62  Labs (2/12): K 4.7, creatinine 1.2, BNP 49, LDL 66, HDL 35 Labs (10/12): K 4.7, creatinine 1.3, LDL 71, HDl 31 Labs (3/13): LDL 60, HDL 39 Labs (7/13): K 5.1=>4.5, creatinine 1.4 Labs (11/13): K 4.9, creatinine 1.3, LDL 60, HDL 31, BNP 46  Allergies (verified):  1) ! Codeine   Past Medical History:  1. CAD: s/p CABG 8/06 with LIMA-LAD, SVG-CFX, SVG-distal RCA.  2. NEPHROLITHIASIS, HX OF (ICD-V13.01)  3. SYSTOLIC HEART FAILURE, CHRONIC (ICD-428.22): Ischemic cardiomyopathy with last echo (2011) showing EF 35-40%, mild LV hypertrophy, inferoposterior akinesis, grade I diastolic dysfunction, biatrial enlargement, mild mitral regurgitation. Patient has Medtronic ICD  4. HYPERTENSION, UNSPECIFIED (ICD-401.9)  5. PROSTATE CANCER (ICD-185): s/p cryosurgical ablation.  6. HYPERLIPIDEMIA-MIXED (ICD-272.4)  7. Abnormal chest x-ray with questionable nodule.  8. Medtronic ICD  9. Carotid stenosis: Dopplers 3/13 with 40-59% RICA stenosis.   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    Current Outpatient Prescriptions  Medication Sig Dispense Refill  . acetaminophen (TYLENOL) 500 MG tablet Take 500 mg by mouth every 6 (six) hours as needed.        Marland Kitchen aspirin EC 81 MG tablet Take two tablets daily--this  will be 162mg  daily      . cetirizine (ZYRTEC) 10 MG tablet Take 10 mg by mouth as needed.       . Cholecalciferol (VITAMIN D3) 1000 UNITS CAPS Take by mouth.        . hydrochlorothiazide (HYDRODIURIL) 25 MG tablet Take 1/2 tablet daily  15 tablet  11  . Misc Natural Products (OSTEO BI-FLEX ADV DOUBLE ST) CAPS Take by mouth.        . simvastatin (ZOCOR) 40 MG tablet take 1 tablet every evening  30 tablet  5  . spironolactone (ALDACTONE) 25 MG tablet Take half tablet by mouth daily  15 tablet  11  . vitamin C (ASCORBIC ACID) 500 MG tablet Take 500 mg by mouth daily.        . carvedilol (COREG) 12.5 MG tablet 1 and 1/2 tablets(total 18.75mg ) two times a day  90 tablet  6  . losartan (COZAAR) 50 MG tablet 1 and 1/2 tablets (total 75mg  ) daily  45 tablet  6   No current facility-administered medications for this visit.    BP 128/70  Pulse 61  Ht 5\' 4"  (1.626 m)  Wt 192 lb (87.091 kg)  BMI 32.94 kg/m2  SpO2 98% General: NAD Neck: No JVD, no thyromegaly or thyroid nodule.  Lungs: Clear to auscultation bilaterally with normal respiratory effort. CV: Nondisplaced PMI.  Heart regular S1/S2, no S3/S4, 1/6 SEM RUSB.  No peripheral edema.  Right carotid bruit.  Normal  pedal pulses.  Abdomen: Soft, nontender, no hepatosplenomegaly, no distention.  Neurologic: Alert and oriented x 3.  Psych: Normal affect. Extremities: No clubbing or cyanosis.   Assessment/Plan:  CAD, AUTOLOGOUS BYPASS GRAFT No exertional chest pain. Continue ASA 81, statin, ARB, beta blocker.  Carotid stenosis  40-59% RICA stenosis. Needs repeat carotid dopplers this month.  HYPERLIPIDEMIA-MIXED  Excellent lipids.  SYSTOLIC HEART FAILURE, CHRONIC  Stable NYHA class II symptoms. Last echo with EF 35-40%. Has Medtronic ICD. He does not look volume overloaded.  - Increase Coreg today to 18.75 mg daily.  - Stop valsartan (no longer covered) and start losartan 75 mg daily to replace it.  Will get BMET in 2 wks.  Need to follow  K closely (high normal K on spironolactone).  - Return in 4 months.  Marca Ancona 07/21/2012 11:32 AM

## 2012-07-21 NOTE — Patient Instructions (Addendum)
Stop diovan (valsartan).   Start losartan 75mg  daily. This will be 1 and 1/2 (total 75mg ) of a 50mg  tablet daily.  Increase coreg(carvedilol) to 18.75mg  two times a day. This will be 1 and 1/2 (total 18.75mg ) of a 12.5mg  tablet two times a day.    Your physician recommends that you return for lab work in: 2 weeks--BMET.   Your physician has requested that you have a carotid duplex. This test is an ultrasound of the carotid arteries in your neck. It looks at blood flow through these arteries that supply the brain with blood. Allow one hour for this exam. There are no restrictions or special instructions. In about 2 weeks.    Your physician wants you to follow-up in: 4 months with Dr Shirlee Latch. (July 2014), You will receive a reminder letter in the mail two months in advance. If you don't receive a letter, please call our office to schedule the follow-up appointment.

## 2012-07-30 DIAGNOSIS — I509 Heart failure, unspecified: Secondary | ICD-10-CM | POA: Diagnosis not present

## 2012-07-30 DIAGNOSIS — I10 Essential (primary) hypertension: Secondary | ICD-10-CM | POA: Diagnosis not present

## 2012-07-30 DIAGNOSIS — E785 Hyperlipidemia, unspecified: Secondary | ICD-10-CM | POA: Diagnosis not present

## 2012-07-30 DIAGNOSIS — I259 Chronic ischemic heart disease, unspecified: Secondary | ICD-10-CM | POA: Diagnosis not present

## 2012-08-05 ENCOUNTER — Ambulatory Visit (INDEPENDENT_AMBULATORY_CARE_PROVIDER_SITE_OTHER): Payer: Medicare Other | Admitting: Internal Medicine

## 2012-08-05 ENCOUNTER — Encounter: Payer: Self-pay | Admitting: Internal Medicine

## 2012-08-05 ENCOUNTER — Encounter (INDEPENDENT_AMBULATORY_CARE_PROVIDER_SITE_OTHER): Payer: Medicare Other

## 2012-08-05 ENCOUNTER — Other Ambulatory Visit (INDEPENDENT_AMBULATORY_CARE_PROVIDER_SITE_OTHER): Payer: Medicare Other

## 2012-08-05 VITALS — BP 125/77 | HR 63 | Ht 64.0 in | Wt 193.4 lb

## 2012-08-05 DIAGNOSIS — I5022 Chronic systolic (congestive) heart failure: Secondary | ICD-10-CM

## 2012-08-05 DIAGNOSIS — I2589 Other forms of chronic ischemic heart disease: Secondary | ICD-10-CM

## 2012-08-05 DIAGNOSIS — I6523 Occlusion and stenosis of bilateral carotid arteries: Secondary | ICD-10-CM

## 2012-08-05 DIAGNOSIS — Z95810 Presence of automatic (implantable) cardiac defibrillator: Secondary | ICD-10-CM

## 2012-08-05 DIAGNOSIS — I428 Other cardiomyopathies: Secondary | ICD-10-CM

## 2012-08-05 DIAGNOSIS — I6529 Occlusion and stenosis of unspecified carotid artery: Secondary | ICD-10-CM

## 2012-08-05 LAB — ICD DEVICE OBSERVATION
BATTERY VOLTAGE: 2.86 V
BRDY-0002RV: 40 {beats}/min
CHARGE TIME: 8.41 s
RV LEAD IMPEDENCE ICD: 456 Ohm
TZAT-0001FASTVT: 1
TZAT-0001SLOWVT: 2
TZAT-0004FASTVT: 8
TZAT-0013FASTVT: 1
TZAT-0019SLOWVT: 8 V
TZAT-0019SLOWVT: 8 V
TZAT-0020FASTVT: 1.6 ms
TZAT-0020SLOWVT: 1.6 ms
TZAT-0020SLOWVT: 1.6 ms
TZON-0008SLOWVT: 0 ms
TZON-0011AFLUTTER: 70
TZST-0001FASTVT: 2
TZST-0001FASTVT: 4
TZST-0001SLOWVT: 3
TZST-0001SLOWVT: 5
TZST-0003FASTVT: 35 J
TZST-0003FASTVT: 35 J
TZST-0003FASTVT: 35 J
TZST-0003SLOWVT: 25 J
TZST-0003SLOWVT: 35 J
TZST-0003SLOWVT: 35 J
VF: 0

## 2012-08-05 LAB — BASIC METABOLIC PANEL
CO2: 29 mEq/L (ref 19–32)
Calcium: 9.5 mg/dL (ref 8.4–10.5)
Creatinine, Ser: 1.5 mg/dL (ref 0.4–1.5)
Sodium: 138 mEq/L (ref 135–145)

## 2012-08-05 NOTE — Patient Instructions (Addendum)
Your physician wants you to follow-up in: 12 months with Dr Court Joy will receive a reminder letter in the mail two months in advance. If you don't receive a letter, please call our office to schedule the follow-up appointment.    Remote monitoring is used to monitor your Pacemaker or ICD from home. This monitoring reduces the number of office visits required to check your device to one time per year. It allows [place] to keep an eye on the functioning of your device to ensure it is working properly. You are scheduled for a device check from home on 11/10/12. You may send your transmission at any time that day. If you have a wireless device, the transmission will be sent automatically. After your physician reviews your transmission, you will receive a postcard with your next transmission date.

## 2012-08-05 NOTE — Assessment & Plan Note (Signed)
He denies anginal symptoms. I've encouraged him to continue his current medical therapy, and maintain a low-sodium diet. He is encouraged to increase his physical activity.

## 2012-08-05 NOTE — Assessment & Plan Note (Signed)
His ICD is working normally. He has several years of battery longevity left. He has a Medtronic device with a 6947-lead in place.

## 2012-08-05 NOTE — Progress Notes (Signed)
HPI [name] returns today for followup He is a pleasant 79 yo man with an ICM, chronic systolic CHF, and HTN. In the interim, he has done well. He has not been in the hospital. He denies chest pain, or shortness of breath. He remains active, working in his garden. He denies peripheral edema, syncope, or ICD shock. Allergies  Allergen Reactions  . Codeine      Current Outpatient Prescriptions  Medication Sig Dispense Refill  . acetaminophen (TYLENOL) 500 MG tablet Take 500 mg by mouth every 6 (six) hours as needed.        Marland Kitchen aspirin EC 81 MG tablet Take two tablets daily--this will be 162mg  daily      . carvedilol (COREG) 12.5 MG tablet 1 and 1/2 tablets(total 18.75mg ) two times a day  90 tablet  6  . cetirizine (ZYRTEC) 10 MG tablet Take 10 mg by mouth as needed.       . Cholecalciferol (VITAMIN D3) 1000 UNITS CAPS Take by mouth.        . hydrochlorothiazide (HYDRODIURIL) 25 MG tablet Take 1/2 tablet daily  15 tablet  11  . losartan (COZAAR) 50 MG tablet 1 and 1/2 tablets (total 75mg  ) daily  45 tablet  6  . Misc Natural Products (OSTEO BI-FLEX ADV DOUBLE ST) CAPS Take by mouth.        . simvastatin (ZOCOR) 40 MG tablet take 1 tablet every evening  30 tablet  5  . spironolactone (ALDACTONE) 25 MG tablet Take half tablet by mouth daily  15 tablet  11  . vitamin C (ASCORBIC ACID) 500 MG tablet Take 500 mg by mouth daily.         No current facility-administered medications for this visit.     Past Medical History  Diagnosis Date  . Coronary artery disease     s/p CABG 8/06 LIMA-LAD, SVG-CFX, SVG-distal RCA  . Nephrolithiasis     hx of  . CHF (congestive heart failure)     systolic  . Hypertension   . Prostate cancer   . Hyperlipidemia     mixed  . ICD (implantable cardiac defibrillator) in place   . BPH (benign prostatic hypertrophy)     ROS:   All systems reviewed and negative except as noted in the HPI.   Past Surgical History  Procedure Laterality Date  . Prostate  surgery    . Coronary artery bypass graft      8/06 with left anterior descending artery, saphenous vein graft to circumflex, saphenous vein graft to distal right coronary artery  . Colonoscopy    . Polypectomy    . Hernia repair       Family History  Problem Relation Age of Onset  . Coronary artery disease       History   Social History  . Marital Status: Married    Spouse Name: N/A    Number of Children: N/A  . Years of Education: N/A   Occupational History  . Retired    Social History Main Topics  . Smoking status: Former Smoker    Types: Cigarettes    Quit date: 05/14/1992  . Smokeless tobacco: Never Used  . Alcohol Use: No  . Drug Use: No  . Sexually Active: Not on file   Other Topics Concern  . Not on file   Social History Narrative  . No narrative on file     BP 125/77  Pulse 63  Ht 5\' 4"  (1.626 m)  Wt 193 lb 6.4 oz (87.726 kg)  BMI 33.18 kg/m2  Physical Exam:  Well appearing 79 year old man, NAD HEENT: Unremarkable Neck:  7 cm JVD, no thyromegally Lungs:  Clear with no wheezes, rales, or rhonchi. HEART:  Regular rate rhythm, no murmurs, no rubs, no clicks Abd:  soft, positive bowel sounds, no organomegally, no rebound, no guarding Ext:  2 plus pulses, no edema, no cyanosis, no clubbing Skin:  No rashes no nodules Neuro:  CN II through XII intact, motor grossly intact  DEVICE  Normal device function.  See PaceArt for details.   Assess/Plan:

## 2012-08-05 NOTE — Assessment & Plan Note (Signed)
His heart failure symptoms are class 1-2. No change in medical therapy. He is encouraged to maintain a low-sodium diet.

## 2012-08-14 ENCOUNTER — Other Ambulatory Visit: Payer: Self-pay | Admitting: *Deleted

## 2012-08-14 DIAGNOSIS — E042 Nontoxic multinodular goiter: Secondary | ICD-10-CM

## 2012-08-20 ENCOUNTER — Ambulatory Visit (HOSPITAL_COMMUNITY)
Admission: RE | Admit: 2012-08-20 | Discharge: 2012-08-20 | Disposition: A | Discharge status: Home / Self Care | Payer: Medicare Other | Source: Ambulatory Visit | Attending: Cardiology | Admitting: Cardiology

## 2012-08-20 DIAGNOSIS — E042 Nontoxic multinodular goiter: Secondary | ICD-10-CM | POA: Insufficient documentation

## 2012-08-25 ENCOUNTER — Telehealth: Payer: Self-pay | Admitting: Internal Medicine

## 2012-08-25 NOTE — Telephone Encounter (Signed)
Results given to pt and forwarded to PCP.

## 2012-08-25 NOTE — Telephone Encounter (Signed)
New Prob    Pt calling back returning a call from last Friday regarding results from carotid. Was told to call and ask for triage nurse.

## 2012-08-25 NOTE — Telephone Encounter (Signed)
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

## 2012-09-16 ENCOUNTER — Other Ambulatory Visit: Payer: Self-pay | Admitting: Cardiology

## 2012-11-10 ENCOUNTER — Ambulatory Visit (INDEPENDENT_AMBULATORY_CARE_PROVIDER_SITE_OTHER): Payer: Medicare Other | Admitting: *Deleted

## 2012-11-10 ENCOUNTER — Encounter: Payer: Self-pay | Admitting: Internal Medicine

## 2012-11-10 DIAGNOSIS — Z95810 Presence of automatic (implantable) cardiac defibrillator: Secondary | ICD-10-CM

## 2012-11-10 DIAGNOSIS — I428 Other cardiomyopathies: Secondary | ICD-10-CM

## 2012-11-11 LAB — REMOTE ICD DEVICE
BATTERY VOLTAGE: 2.79 V
BRDY-0002RV: 40 {beats}/min
RV LEAD AMPLITUDE: 6.6 mv
TOT-0006: 20140325000000
TZAT-0001FASTVT: 1
TZAT-0004FASTVT: 8
TZAT-0011SLOWVT: 10 ms
TZAT-0011SLOWVT: 10 ms
TZAT-0012FASTVT: 200 ms
TZAT-0012SLOWVT: 200 ms
TZAT-0012SLOWVT: 200 ms
TZAT-0013FASTVT: 1
TZAT-0013SLOWVT: 2
TZAT-0018FASTVT: NEGATIVE
TZAT-0018SLOWVT: NEGATIVE
TZAT-0018SLOWVT: NEGATIVE
TZAT-0019FASTVT: 8 V
TZAT-0019SLOWVT: 8 V
TZAT-0019SLOWVT: 8 V
TZAT-0020FASTVT: 1.6 ms
TZON-0003FASTVT: 240 ms
TZON-0003SLOWVT: 370 ms
TZON-0005SLOWVT: 12
TZON-0008FASTVT: 0 ms
TZON-0008SLOWVT: 0 ms
TZON-0011AFLUTTER: 70
TZST-0001FASTVT: 4
TZST-0001SLOWVT: 4
TZST-0003FASTVT: 35 J
TZST-0003FASTVT: 35 J
TZST-0003SLOWVT: 35 J

## 2012-11-14 ENCOUNTER — Other Ambulatory Visit: Payer: Self-pay | Admitting: Cardiology

## 2012-11-18 ENCOUNTER — Encounter: Payer: Self-pay | Admitting: *Deleted

## 2012-11-25 DIAGNOSIS — I10 Essential (primary) hypertension: Secondary | ICD-10-CM | POA: Diagnosis not present

## 2012-11-25 DIAGNOSIS — I259 Chronic ischemic heart disease, unspecified: Secondary | ICD-10-CM | POA: Diagnosis not present

## 2012-11-25 DIAGNOSIS — E785 Hyperlipidemia, unspecified: Secondary | ICD-10-CM | POA: Diagnosis not present

## 2012-11-25 DIAGNOSIS — H6120 Impacted cerumen, unspecified ear: Secondary | ICD-10-CM | POA: Diagnosis not present

## 2012-12-02 ENCOUNTER — Ambulatory Visit (INDEPENDENT_AMBULATORY_CARE_PROVIDER_SITE_OTHER): Payer: Medicare Other | Admitting: Cardiology

## 2012-12-02 ENCOUNTER — Encounter: Payer: Self-pay | Admitting: Cardiology

## 2012-12-02 VITALS — BP 128/72 | HR 58 | Ht 64.0 in | Wt 194.0 lb

## 2012-12-02 DIAGNOSIS — I6529 Occlusion and stenosis of unspecified carotid artery: Secondary | ICD-10-CM

## 2012-12-02 DIAGNOSIS — E785 Hyperlipidemia, unspecified: Secondary | ICD-10-CM

## 2012-12-02 DIAGNOSIS — I2510 Atherosclerotic heart disease of native coronary artery without angina pectoris: Secondary | ICD-10-CM | POA: Diagnosis not present

## 2012-12-02 DIAGNOSIS — I5022 Chronic systolic (congestive) heart failure: Secondary | ICD-10-CM | POA: Diagnosis not present

## 2012-12-02 LAB — HEPATIC FUNCTION PANEL
ALT: 13 U/L (ref 0–53)
Albumin: 3.9 g/dL (ref 3.5–5.2)
Total Bilirubin: 0.7 mg/dL (ref 0.3–1.2)

## 2012-12-02 LAB — BASIC METABOLIC PANEL
CO2: 28 mEq/L (ref 19–32)
GFR: 51.73 mL/min — ABNORMAL LOW (ref >60.00)
Glucose, Bld: 152 mg/dL — ABNORMAL HIGH (ref 70–99)
Potassium: 5 mEq/L (ref 3.5–5.1)
Sodium: 139 mEq/L (ref 135–145)

## 2012-12-02 LAB — LIPID PANEL
HDL: 30.8 mg/dL — ABNORMAL LOW (ref >39.00)
Total CHOL/HDL Ratio: 4
Triglycerides: 149 mg/dL (ref 0.0–149.0)
VLDL: 29.8 mg/dL (ref 0.0–40.0)

## 2012-12-02 NOTE — Progress Notes (Signed)
Patient ID: [name], male   DOB: 08-08-1926, 79 y.o.   MRN: [id number] PCP: Dr. Juanetta Gosling  79 yo with history of CAD s/p CABG and ischemic cardiomyopathy presents for cardiology followup.  He has been doing well since I last saw him.  He does not get short of breath when he walks typically. He can walk up to a mile and walks in the mall for exercise on most days. He does get mild shortness of breath with more heavy exertion.  He is more limited by his hip pain. No chest pain. Weight has been stable.     ECG: NSR, RBBB, LAFB  Labs (10/11): K 4.2, creatinine 1.3, BNP 62  Labs (2/12): K 4.7, creatinine 1.2, BNP 49, LDL 66, HDL 35 Labs (10/12): K 4.7, creatinine 1.3, LDL 71, HDl 31 Labs (3/13): LDL 60, HDL 39 Labs (7/13): K 5.1=>4.5, creatinine 1.4 Labs (11/13): K 4.9, creatinine 1.3, LDL 60, HDL 31, BNP 46 Labs (3/14): K 4.9, creatinine 1.5  Allergies (verified):  1) ! Codeine   Past Medical History:  1. CAD: s/p CABG 8/06 with LIMA-LAD, SVG-CFX, SVG-distal RCA.  2. NEPHROLITHIASIS, HX OF (ICD-V13.01)  3. SYSTOLIC HEART FAILURE, CHRONIC (ICD-428.22): Ischemic cardiomyopathy with last echo (2011) showing EF 35-40%, mild LV hypertrophy, inferoposterior akinesis, grade I diastolic dysfunction, biatrial enlargement, mild mitral regurgitation. Patient has Medtronic ICD  4. HYPERTENSION, UNSPECIFIED (ICD-401.9)  5. PROSTATE CANCER (ICD-185): s/p cryosurgical ablation.  6. HYPERLIPIDEMIA-MIXED (ICD-272.4)  7. Abnormal chest x-ray with questionable nodule.  8. Medtronic ICD  9. Carotid stenosis: Dopplers 3/13 with 40-59% RICA stenosis. Dopplers (3/14) with 40-59% bilateral ICA stenosis.  10. Multinodular goiter: noted on thyroid US.   Family History:  CAD   Social History:  He lives with his wife in [place]. 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    Current Outpatient Prescriptions  Medication Sig Dispense  Refill  . acetaminophen (TYLENOL) 500 MG tablet Take 500 mg by mouth every 6 (six) hours as needed.        Marland Kitchen aspirin EC 81 MG tablet Take two tablets daily--this will be 162mg  daily      . carvedilol (COREG) 12.5 MG tablet 1 and 1/2 tablets(total 18.75mg ) two times a day  90 tablet  6  . cetirizine (ZYRTEC) 10 MG tablet Take 10 mg by mouth as needed.       . Cholecalciferol (VITAMIN D3) 1000 UNITS CAPS Take by mouth.        . hydrochlorothiazide (HYDRODIURIL) 25 MG tablet take 1/2 tablet once daily  15 tablet  5  . losartan (COZAAR) 50 MG tablet 1 and 1/2 tablets (total 75mg  ) daily  45 tablet  6  . Misc Natural Products (OSTEO BI-FLEX ADV DOUBLE ST) CAPS Take by mouth.        . simvastatin (ZOCOR) 40 MG tablet TAKE ONE TABLET EVERY EVENING  30 tablet  5  . spironolactone (ALDACTONE) 25 MG tablet Take half tablet by mouth daily  15 tablet  11  . vitamin C (ASCORBIC ACID) 500 MG tablet Take 500 mg by mouth daily.         No current facility-administered medications for this visit.    BP 128/72  Pulse 58  Ht 5\' 4"  (1.626 m)  Wt 87.998 kg (194 lb)  BMI 33.28 kg/m2 General: NAD Neck: No JVD, no thyromegaly or thyroid nodule.  Lungs: Clear to  auscultation bilaterally with normal respiratory effort. CV: Nondisplaced PMI.  Heart regular S1/S2, no S3/S4, 1/6 SEM RUSB.  No peripheral edema.  Right carotid bruit.  Normal pedal pulses.  Abdomen: Soft, nontender, no hepatosplenomegaly, no distention.  Neurologic: Alert and oriented x 3.  Psych: Normal affect. Extremities: No clubbing or cyanosis.   Assessment/Plan:  CAD, AUTOLOGOUS BYPASS GRAFT No exertional chest pain. Continue ASA 81, statin, ARB, beta blocker.  Carotid stenosis  40-59% bilateral ICA stenosis. Needs repeat carotid dopplers 3/15.  HYPERLIPIDEMIA-MIXED  Check lipids today. SYSTOLIC HEART FAILURE, CHRONIC  Stable NYHA class II symptoms. Last echo with EF 35-40%. Has Medtronic ICD. He does not look volume overloaded.  -  Continue current Coreg (HR in 50s).    - I will not increase losartan or spironolactone today given CKD and high normal K in the past.  I will get BMET/BNP today.  - Return in 4 months.  Marca Ancona 12/02/2012

## 2012-12-02 NOTE — Patient Instructions (Addendum)
Your physician recommends that you have  lab work today--BMET/Lipid profile/Liver profile.    Your physician wants you to follow-up in: 4 months with Dr Shirlee Latch. (November 2014)You will receive a reminder letter in the mail two months in advance. If you don't receive a letter, please call our office to schedule the follow-up appointment.

## 2012-12-03 DIAGNOSIS — H6120 Impacted cerumen, unspecified ear: Secondary | ICD-10-CM | POA: Diagnosis not present

## 2012-12-03 DIAGNOSIS — I259 Chronic ischemic heart disease, unspecified: Secondary | ICD-10-CM | POA: Diagnosis not present

## 2012-12-13 ENCOUNTER — Other Ambulatory Visit: Payer: Self-pay | Admitting: Cardiology

## 2012-12-29 DIAGNOSIS — I509 Heart failure, unspecified: Secondary | ICD-10-CM | POA: Diagnosis not present

## 2012-12-29 DIAGNOSIS — I259 Chronic ischemic heart disease, unspecified: Secondary | ICD-10-CM | POA: Diagnosis not present

## 2012-12-29 DIAGNOSIS — J209 Acute bronchitis, unspecified: Secondary | ICD-10-CM | POA: Diagnosis not present

## 2013-02-02 DIAGNOSIS — L821 Other seborrheic keratosis: Secondary | ICD-10-CM | POA: Diagnosis not present

## 2013-02-02 DIAGNOSIS — D1800 Hemangioma unspecified site: Secondary | ICD-10-CM | POA: Diagnosis not present

## 2013-02-02 DIAGNOSIS — L570 Actinic keratosis: Secondary | ICD-10-CM | POA: Diagnosis not present

## 2013-02-13 ENCOUNTER — Other Ambulatory Visit: Payer: Self-pay | Admitting: Cardiology

## 2013-02-16 ENCOUNTER — Ambulatory Visit (INDEPENDENT_AMBULATORY_CARE_PROVIDER_SITE_OTHER): Payer: Medicare Other | Admitting: *Deleted

## 2013-02-16 DIAGNOSIS — Z95810 Presence of automatic (implantable) cardiac defibrillator: Secondary | ICD-10-CM | POA: Diagnosis not present

## 2013-02-16 DIAGNOSIS — I428 Other cardiomyopathies: Secondary | ICD-10-CM

## 2013-02-20 LAB — REMOTE ICD DEVICE
BATTERY VOLTAGE: 2.75 V
BRDY-0002RV: 40 {beats}/min
CHARGE TIME: 8.95 s
DEV-0020ICD: NEGATIVE
RV LEAD AMPLITUDE: 6.1 mv
TOT-0006: 20140630000000
TZAT-0001FASTVT: 1
TZAT-0011SLOWVT: 10 ms
TZAT-0011SLOWVT: 10 ms
TZAT-0012FASTVT: 200 ms
TZAT-0012SLOWVT: 200 ms
TZAT-0012SLOWVT: 200 ms
TZAT-0013FASTVT: 1
TZAT-0013SLOWVT: 2
TZAT-0018FASTVT: NEGATIVE
TZAT-0018SLOWVT: NEGATIVE
TZAT-0018SLOWVT: NEGATIVE
TZAT-0019FASTVT: 8 V
TZAT-0019SLOWVT: 8 V
TZAT-0019SLOWVT: 8 V
TZAT-0020FASTVT: 1.6 ms
TZAT-0020SLOWVT: 1.6 ms
TZAT-0020SLOWVT: 1.6 ms
TZON-0003FASTVT: 240 ms
TZON-0003SLOWVT: 370 ms
TZON-0005SLOWVT: 12
TZON-0008FASTVT: 0 ms
TZON-0008SLOWVT: 0 ms
TZON-0011AFLUTTER: 70
TZST-0001FASTVT: 2
TZST-0001FASTVT: 4
TZST-0001SLOWVT: 4
TZST-0001SLOWVT: 5
TZST-0003FASTVT: 35 J
TZST-0003FASTVT: 35 J
TZST-0003FASTVT: 35 J
TZST-0003SLOWVT: 35 J
TZST-0003SLOWVT: 35 J
VENTRICULAR PACING ICD: 0 pct

## 2013-02-24 ENCOUNTER — Encounter: Payer: Self-pay | Admitting: *Deleted

## 2013-02-26 DIAGNOSIS — Z23 Encounter for immunization: Secondary | ICD-10-CM | POA: Diagnosis not present

## 2013-03-04 ENCOUNTER — Encounter: Payer: Self-pay | Admitting: Internal Medicine

## 2013-03-14 ENCOUNTER — Other Ambulatory Visit: Payer: Self-pay | Admitting: Cardiology

## 2013-04-01 DIAGNOSIS — J449 Chronic obstructive pulmonary disease, unspecified: Secondary | ICD-10-CM | POA: Diagnosis not present

## 2013-04-01 DIAGNOSIS — I110 Hypertensive heart disease with heart failure: Secondary | ICD-10-CM | POA: Diagnosis not present

## 2013-04-01 DIAGNOSIS — E785 Hyperlipidemia, unspecified: Secondary | ICD-10-CM | POA: Diagnosis not present

## 2013-04-01 DIAGNOSIS — I509 Heart failure, unspecified: Secondary | ICD-10-CM | POA: Diagnosis not present

## 2013-04-06 ENCOUNTER — Encounter: Payer: Self-pay | Admitting: Cardiology

## 2013-04-06 ENCOUNTER — Ambulatory Visit (INDEPENDENT_AMBULATORY_CARE_PROVIDER_SITE_OTHER): Payer: Medicare Other | Admitting: Cardiology

## 2013-04-06 VITALS — BP 128/69 | HR 66 | Ht 64.0 in | Wt 192.8 lb

## 2013-04-06 DIAGNOSIS — I5022 Chronic systolic (congestive) heart failure: Secondary | ICD-10-CM | POA: Diagnosis not present

## 2013-04-06 DIAGNOSIS — E785 Hyperlipidemia, unspecified: Secondary | ICD-10-CM

## 2013-04-06 DIAGNOSIS — I25810 Atherosclerosis of coronary artery bypass graft(s) without angina pectoris: Secondary | ICD-10-CM | POA: Diagnosis not present

## 2013-04-06 DIAGNOSIS — R0602 Shortness of breath: Secondary | ICD-10-CM

## 2013-04-06 DIAGNOSIS — I658 Occlusion and stenosis of other precerebral arteries: Secondary | ICD-10-CM

## 2013-04-06 DIAGNOSIS — I6523 Occlusion and stenosis of bilateral carotid arteries: Secondary | ICD-10-CM

## 2013-04-06 DIAGNOSIS — I6529 Occlusion and stenosis of unspecified carotid artery: Secondary | ICD-10-CM

## 2013-04-06 LAB — BASIC METABOLIC PANEL
CO2: 29 mEq/L (ref 19–32)
Glucose, Bld: 104 mg/dL — ABNORMAL HIGH (ref 70–99)
Potassium: 4.8 mEq/L (ref 3.5–5.1)
Sodium: 139 mEq/L (ref 135–145)

## 2013-04-06 NOTE — Progress Notes (Signed)
Patient ID: [name], male   DOB: [birth year], 79 y.o.   MRN: [id number] PCP: Dr. Juanetta Gosling  79 y.o. with history of CAD s/p CABG and ischemic cardiomyopathy presents for cardiology followup.  He has been doing well since I last saw him.  He does not get short of breath when he walks typically. He can walk up to a mile and walks in the mall for exercise on most days. He does get mild shortness of breath with more heavy exertion.  He is more limited by his right hip pain. No chest pain. Weight has been stable.  K level has been upper normal.   Labs (10/11): K 4.2, creatinine 1.3, BNP 62  Labs (2/12): K 4.7, creatinine 1.2, BNP 49, LDL 66, HDL 35 Labs (10/12): K 4.7, creatinine 1.3, LDL 71, HDl 31 Labs (3/13): LDL 60, HDL 39 Labs (7/13): K 5.1=>4.5, creatinine 1.4 Labs (11/13): K 4.9, creatinine 1.3, LDL 60, HDL 31, BNP 46 Labs (3/14): K 4.9, creatinine 1.5, LDL 53, HDL 31 Labs (7/14): K 5.0, creatinine 1.4  Allergies (verified):  1) ! Codeine   Past Medical History:  1. CAD: s/p CABG 8/06 with LIMA-LAD, SVG-CFX, SVG-distal RCA.  2. NEPHROLITHIASIS, HX OF (ICD-V13.01)  3. SYSTOLIC HEART FAILURE, CHRONIC (ICD-428.22): Ischemic cardiomyopathy with last echo (2011) showing EF 35-40%, mild LV hypertrophy, inferoposterior akinesis, grade I diastolic dysfunction, biatrial enlargement, mild mitral regurgitation. Patient has Medtronic ICD  4. HYPERTENSION, UNSPECIFIED (ICD-401.9)  5. PROSTATE CANCER (ICD-185): s/p cryosurgical ablation.  6. HYPERLIPIDEMIA-MIXED (ICD-272.4)  7. Abnormal chest x-ray with questionable nodule.  8. Medtronic ICD  9. Carotid stenosis: Dopplers 3/13 with 40-59% RICA stenosis. Dopplers (3/14) with 40-59% bilateral ICA stenosis.  10. Multinodular goiter: noted on thyroid US.   Family History:  CAD   Social History:  He lives with his wife in [place]. 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     Current Outpatient Prescriptions  Medication Sig Dispense Refill  . acetaminophen (TYLENOL) 500 MG tablet Take 500 mg by mouth every 6 (six) hours as needed.        Marland Kitchen aspirin EC 81 MG tablet Take two tablets daily--this will be 162mg  daily      . carvedilol (COREG) 12.5 MG tablet TAKE 1 AND 1/2 TABLET BY MOUTH TWICE A DAY  90 tablet  3  . cetirizine (ZYRTEC) 10 MG tablet Take 10 mg by mouth as needed.       . Cholecalciferol (VITAMIN D3) 1000 UNITS CAPS Take by mouth.        . hydrochlorothiazide (HYDRODIURIL) 25 MG tablet take 1/2 tablet once daily  15 tablet  5  . losartan (COZAAR) 50 MG tablet take 1 and 1/2 tablets by mouth once daily  45 tablet  3  . Misc Natural Products (OSTEO BI-FLEX ADV DOUBLE ST) CAPS Take by mouth.        . simvastatin (ZOCOR) 40 MG tablet take 1 tablet by mouth every evening  30 tablet  0  . spironolactone (ALDACTONE) 25 MG tablet take 1/2 tablet once daily  15 tablet  5  . vitamin C (ASCORBIC ACID) 500 MG tablet Take 500 mg by mouth daily.         No current facility-administered medications for this visit.    BP 128/69  Pulse 66  Ht 5\' 4"  (1.626 m)  Wt 87.454 kg (192 lb 12.8 oz)  BMI  33.08 kg/m2 General: NAD Neck: No JVD, no thyromegaly or thyroid nodule.  Lungs: Clear to auscultation bilaterally with normal respiratory effort. CV: Nondisplaced PMI.  Heart regular S1/S2, no S3/S4, 1/6 SEM RUSB.  No peripheral edema.  Right carotid bruit.  Normal pedal pulses.  Abdomen: Soft, nontender, no hepatosplenomegaly, no distention.  Neurologic: Alert and oriented x 3.  Psych: Normal affect. Extremities: No clubbing or cyanosis.   Assessment/Plan:  CAD, AUTOLOGOUS BYPASS GRAFT No exertional chest pain. Continue ASA 81, statin, ARB, beta blocker.  Carotid stenosis  40-59% bilateral ICA stenosis. Needs repeat carotid dopplers 3/15.  HYPERLIPIDEMIA-MIXED  Good LDL when last checked.  SYSTOLIC HEART FAILURE, CHRONIC  Stable NYHA class II symptoms. Last  echo with EF 35-40%. Has Medtronic ICD. He does not look volume overloaded.  - Continue current Coreg.    - I will not increase losartan or spironolactone today given CKD and high normal K in the past.  I will get BMET/BNP today.  - Return in 4 months.  Marca Ancona 04/06/2013

## 2013-04-06 NOTE — Patient Instructions (Signed)
Your physician recommends that you have lab work today--BMET/BNP.   Your physician recommends that you schedule a follow-up appointment in: 4 months with Dr McLean.    

## 2013-04-16 ENCOUNTER — Other Ambulatory Visit: Payer: Self-pay | Admitting: Cardiology

## 2013-04-21 ENCOUNTER — Telehealth: Payer: Self-pay | Admitting: Cardiology

## 2013-04-21 NOTE — Telephone Encounter (Signed)
.  Patient aware of results by phone.

## 2013-04-21 NOTE — Telephone Encounter (Signed)
New problem    Pt want to know his lab results. Please call pt

## 2013-05-11 ENCOUNTER — Other Ambulatory Visit: Payer: Self-pay | Admitting: Cardiology

## 2013-05-22 ENCOUNTER — Encounter: Payer: Medicare Other | Admitting: *Deleted

## 2013-05-22 DIAGNOSIS — I428 Other cardiomyopathies: Secondary | ICD-10-CM

## 2013-05-22 DIAGNOSIS — Z95810 Presence of automatic (implantable) cardiac defibrillator: Secondary | ICD-10-CM

## 2013-05-22 LAB — MDC_IDC_ENUM_SESS_TYPE_REMOTE
Battery Voltage: 2.68 V
Brady Statistic RV Percent Paced: 0 %
Date Time Interrogation Session: 20150109104001
HIGH POWER IMPEDANCE MEASURED VALUE: 45 Ohm
Lead Channel Impedance Value: 448 Ohm
Lead Channel Setting Pacing Amplitude: 3 V
Lead Channel Setting Sensing Sensitivity: 0.3 mV
MDC IDC MSMT LEADCHNL RV SENSING INTR AMPL: 5.7 mV
MDC IDC SET LEADCHNL RV PACING PULSEWIDTH: 0.4 ms
Zone Setting Detection Interval: 240 ms
Zone Setting Detection Interval: 300 ms
Zone Setting Detection Interval: 370 ms

## 2013-05-28 ENCOUNTER — Encounter: Payer: Self-pay | Admitting: *Deleted

## 2013-06-03 ENCOUNTER — Encounter: Payer: Self-pay | Admitting: Internal Medicine

## 2013-06-14 ENCOUNTER — Other Ambulatory Visit: Payer: Self-pay | Admitting: Cardiology

## 2013-06-16 ENCOUNTER — Encounter (INDEPENDENT_AMBULATORY_CARE_PROVIDER_SITE_OTHER): Payer: Self-pay | Admitting: *Deleted

## 2013-06-23 DIAGNOSIS — Z961 Presence of intraocular lens: Secondary | ICD-10-CM | POA: Diagnosis not present

## 2013-06-24 ENCOUNTER — Other Ambulatory Visit (INDEPENDENT_AMBULATORY_CARE_PROVIDER_SITE_OTHER): Payer: Self-pay | Admitting: *Deleted

## 2013-06-24 ENCOUNTER — Telehealth (INDEPENDENT_AMBULATORY_CARE_PROVIDER_SITE_OTHER): Payer: Self-pay | Admitting: *Deleted

## 2013-06-24 DIAGNOSIS — Z1211 Encounter for screening for malignant neoplasm of colon: Secondary | ICD-10-CM

## 2013-06-24 DIAGNOSIS — Z800 Family history of malignant neoplasm of digestive organs: Secondary | ICD-10-CM

## 2013-06-24 DIAGNOSIS — Z86010 Personal history of colonic polyps: Secondary | ICD-10-CM

## 2013-06-24 NOTE — Telephone Encounter (Signed)
Patient needs movi prep 

## 2013-06-25 MED ORDER — PEG-KCL-NACL-NASULF-NA ASC-C 100 G PO SOLR: 1.0000 | Freq: Once | ORAL | Status: DC

## 2013-07-21 DIAGNOSIS — C61 Malignant neoplasm of prostate: Secondary | ICD-10-CM | POA: Diagnosis not present

## 2013-07-23 ENCOUNTER — Encounter: Payer: Self-pay | Admitting: *Deleted

## 2013-07-24 ENCOUNTER — Ambulatory Visit (INDEPENDENT_AMBULATORY_CARE_PROVIDER_SITE_OTHER): Payer: Medicare Other | Admitting: Cardiology

## 2013-07-24 ENCOUNTER — Encounter: Payer: Self-pay | Admitting: Cardiology

## 2013-07-24 VITALS — BP 118/66 | HR 61 | Ht 64.0 in | Wt 192.0 lb

## 2013-07-24 DIAGNOSIS — I6529 Occlusion and stenosis of unspecified carotid artery: Secondary | ICD-10-CM | POA: Diagnosis not present

## 2013-07-24 DIAGNOSIS — I25810 Atherosclerosis of coronary artery bypass graft(s) without angina pectoris: Secondary | ICD-10-CM | POA: Diagnosis not present

## 2013-07-24 DIAGNOSIS — I5022 Chronic systolic (congestive) heart failure: Secondary | ICD-10-CM

## 2013-07-24 DIAGNOSIS — I10 Essential (primary) hypertension: Secondary | ICD-10-CM | POA: Diagnosis not present

## 2013-07-24 DIAGNOSIS — E785 Hyperlipidemia, unspecified: Secondary | ICD-10-CM

## 2013-07-24 DIAGNOSIS — I2510 Atherosclerotic heart disease of native coronary artery without angina pectoris: Secondary | ICD-10-CM | POA: Diagnosis not present

## 2013-07-24 NOTE — Patient Instructions (Signed)
Your physician recommends that you return for lab work today for bmet.  Your physician recommends that you return for a FASTING lipid profile and Bmet in 3 months on 10/26/13.   Your physician wants you to follow-up in: 6 months with Dr. Aundra Dubin. You will receive a reminder letter in the mail two months in advance. If you don't receive a letter, please call our office to schedule the follow-up appointment.

## 2013-07-25 LAB — BASIC METABOLIC PANEL
BUN: 37 mg/dL — ABNORMAL HIGH (ref 6–23)
CALCIUM: 9.4 mg/dL (ref 8.4–10.5)
CO2: 27 mEq/L (ref 19–32)
CREATININE: 1.43 mg/dL — AB (ref 0.50–1.35)
Chloride: 99 mEq/L (ref 96–112)
Glucose, Bld: 115 mg/dL — ABNORMAL HIGH (ref 70–99)
Potassium: 4.4 mEq/L (ref 3.5–5.3)
Sodium: 137 mEq/L (ref 135–145)

## 2013-07-26 NOTE — Progress Notes (Signed)
Patient ID: [name], male   DOB: [birth year], 80 y.o.   MRN: [id number] PCP: Dr. Luan Pulling  80 yo with history of CAD s/p CABG and ischemic cardiomyopathy presents for cardiology followup.  He has been doing well since I last saw him.  He does not get short of breath when he walks typically. He can walk up to a mile and walks in the mall for exercise on most days. He does get mild shortness of breath with more heavy exertion such as walking up hills.  He is more limited by his right hip pain. No chest pain. Weight has been stable.    ECG: NSR, LAFB, RBBB  Labs (10/11): K 4.2, creatinine 1.3, BNP 62  Labs (2/12): K 4.7, creatinine 1.2, BNP 49, LDL 66, HDL 35 Labs (10/12): K 4.7, creatinine 1.3, LDL 71, HDl 31 Labs (3/13): LDL 60, HDL 39 Labs (7/13): K 5.1=>4.5, creatinine 1.4 Labs (11/13): K 4.9, creatinine 1.3, LDL 60, HDL 31, BNP 46 Labs (3/14): K 4.9, creatinine 1.5, LDL 53, HDL 31 Labs (7/14): K 5.0, creatinine 1.4 Labs (11/14): K 4.8, creatinine 1.3, BNP 87  Allergies (verified):  1) ! Codeine   Past Medical History:  1. CAD: s/p CABG 8/06 with LIMA-LAD, SVG-CFX, SVG-distal RCA.  2. NEPHROLITHIASIS  3. SYSTOLIC HEART FAILURE: Ischemic cardiomyopathy with last echo (2011) showing EF 35-40%, mild LV hypertrophy, inferoposterior akinesis, grade I diastolic dysfunction, biatrial enlargement, mild mitral regurgitation. Patient has Medtronic ICD  4. HYPERTENSION 5. PROSTATE CANCER: s/p cryosurgical ablation.  6. HYPERLIPIDEMIA 7. Abnormal chest x-ray with questionable nodule.  8. Medtronic ICD  9. Carotid stenosis: Dopplers 3/13 with 40-59% RICA stenosis. Dopplers (3/14) with 40-59% bilateral ICA stenosis.  10. Multinodular goiter: noted on thyroid US.  11. CKD  Family History:  CAD   Social History:  He lives with his wife in [place]. Retired Engineer, materials.  He does not smoke    Current Outpatient Prescriptions  Medication Sig Dispense Refill  . acetaminophen  (TYLENOL) 500 MG tablet Take 500 mg by mouth every 6 (six) hours as needed.        Marland Kitchen aspirin EC 81 MG tablet Take two tablets daily--this will be 124m daily      . carvedilol (COREG) 12.5 MG tablet TAKE 1 AND 1/2 TABLETS BY MOUTH TWICE A DAY  90 tablet  3  . cetirizine (ZYRTEC) 10 MG tablet Take 10 mg by mouth as needed.       . Cholecalciferol (VITAMIN D3) 1000 UNITS CAPS Take by mouth.        . hydrochlorothiazide (HYDRODIURIL) 25 MG tablet take 1/2 tablet once daily  15 tablet  5  . losartan (COZAAR) 50 MG tablet take 1 and 1/2 tablets by mouth once daily  45 tablet  3  . Misc Natural Products (OSTEO BI-FLEX ADV DOUBLE ST) CAPS Take by mouth.        . simvastatin (ZOCOR) 40 MG tablet TAKE ONE TABLET BY MOUTH EVERY EVENING  30 tablet  0  . spironolactone (ALDACTONE) 25 MG tablet take 1/2 tablet once daily  15 tablet  5  . vitamin C (ASCORBIC ACID) 500 MG tablet Take 500 mg by mouth daily.        . peg 3350 powder (MOVIPREP) 100 G SOLR Take 1 kit (200 g total) by mouth once.  1 kit  0   No current facility-administered medications for this visit.    BP 118/66  Pulse 61  Ht  '5\' 4"'  (1.626 m)  Wt 87.091 kg (192 lb)  BMI 32.94 kg/m2 General: NAD Neck: No JVD, no thyromegaly or thyroid nodule.  Lungs: Clear to auscultation bilaterally with normal respiratory effort. CV: Nondisplaced PMI.  Heart regular S1/S2, no S3/S4, 1/6 SEM RUSB.  Trace ankle edema.  Right carotid bruit.  Normal pedal pulses.  Abdomen: Soft, nontender, no hepatosplenomegaly, no distention.  Neurologic: Alert and oriented x 3.  Psych: Normal affect. Extremities: No clubbing or cyanosis.   Assessment/Plan:  CAD, AUTOLOGOUS BYPASS GRAFT No exertional chest pain. Continue ASA 81, statin, ARB, beta blocker.  Carotid stenosis  40-59% bilateral ICA stenosis. Needs repeat carotid dopplers this month.   HYPERLIPIDEMIA Check lipids in 3 months.   SYSTOLIC HEART FAILURE, CHRONIC  Stable NYHA class II symptoms. Last echo  with EF 35-40%. Has Medtronic ICD. He does not look volume overloaded.  - Continue current Coreg.    - I will not increase losartan or spironolactone today given CKD and high normal K in the past.  I will get BMET - Return in 6 months but will get another BMET in 3 months with spironolactone use.  Loralie Champagne 07/26/2013

## 2013-07-30 ENCOUNTER — Ambulatory Visit (HOSPITAL_COMMUNITY): Payer: Medicare Other | Attending: Cardiology | Admitting: Cardiology

## 2013-07-30 DIAGNOSIS — I2510 Atherosclerotic heart disease of native coronary artery without angina pectoris: Secondary | ICD-10-CM | POA: Diagnosis not present

## 2013-07-30 DIAGNOSIS — I10 Essential (primary) hypertension: Secondary | ICD-10-CM | POA: Insufficient documentation

## 2013-07-30 DIAGNOSIS — E785 Hyperlipidemia, unspecified: Secondary | ICD-10-CM | POA: Insufficient documentation

## 2013-07-30 DIAGNOSIS — I658 Occlusion and stenosis of other precerebral arteries: Secondary | ICD-10-CM | POA: Insufficient documentation

## 2013-07-30 DIAGNOSIS — Z951 Presence of aortocoronary bypass graft: Secondary | ICD-10-CM | POA: Diagnosis not present

## 2013-07-30 DIAGNOSIS — R0989 Other specified symptoms and signs involving the circulatory and respiratory systems: Secondary | ICD-10-CM | POA: Insufficient documentation

## 2013-07-30 DIAGNOSIS — I6529 Occlusion and stenosis of unspecified carotid artery: Secondary | ICD-10-CM | POA: Diagnosis not present

## 2013-07-30 NOTE — Progress Notes (Signed)
Carotid duplex complete 

## 2013-07-31 ENCOUNTER — Telehealth (INDEPENDENT_AMBULATORY_CARE_PROVIDER_SITE_OTHER): Payer: Self-pay | Admitting: *Deleted

## 2013-07-31 DIAGNOSIS — E785 Hyperlipidemia, unspecified: Secondary | ICD-10-CM | POA: Diagnosis not present

## 2013-07-31 DIAGNOSIS — I259 Chronic ischemic heart disease, unspecified: Secondary | ICD-10-CM | POA: Diagnosis not present

## 2013-07-31 DIAGNOSIS — I509 Heart failure, unspecified: Secondary | ICD-10-CM | POA: Diagnosis not present

## 2013-07-31 DIAGNOSIS — I10 Essential (primary) hypertension: Secondary | ICD-10-CM | POA: Diagnosis not present

## 2013-07-31 NOTE — Telephone Encounter (Signed)
  Procedure: tcs  Reason/Indication:  Hx polyps, fam hx colon ca  Has patient had this procedure before?  Yes, 2010 -- paper chart  If so, when, by whom and where?    Is there a family history of colon cancer?  Yes, father  Who?  What age when diagnosed?    Is patient diabetic?   no      Does patient have prosthetic heart valve?  no  Do you have a pacemaker?  Yes, defibrillator/pacemaker combined (2006)  Has patient ever had endocarditis? no  Has patient had joint replacement within last 12 months?  no  Does patient tend to be constipated or take laxatives? no  Is patient on Coumadin, Plavix and/or Aspirin? yes  Medications: asa 81 mg bid, losartan 75 mg 1 1/2 tab daily, carvedilol 12.5 mg 1 1/2 tab bid, simvastatin 40 mg daily, hctz 25 mg 1/2 tab daily, spironolactone 25 mg 1/2 tab daily, vit c 500 mg daily, tylenol 500 mg prn, vit d3 1000 units daily, allegra 10 mgprn  Allergies: codeine  Medication Adjustment: asa 2 days  Procedure date & time: 08/19/13

## 2013-08-03 NOTE — Telephone Encounter (Signed)
agree

## 2013-08-04 ENCOUNTER — Ambulatory Visit: Payer: Medicare Other | Admitting: Cardiology

## 2013-08-04 ENCOUNTER — Encounter (HOSPITAL_COMMUNITY): Payer: Self-pay | Admitting: Pharmacy Technician

## 2013-08-06 ENCOUNTER — Encounter: Payer: Self-pay | Admitting: Internal Medicine

## 2013-08-06 ENCOUNTER — Ambulatory Visit (INDEPENDENT_AMBULATORY_CARE_PROVIDER_SITE_OTHER): Payer: Medicare Other | Admitting: Internal Medicine

## 2013-08-06 VITALS — BP 118/61 | HR 59 | Ht 64.0 in | Wt 193.0 lb

## 2013-08-06 DIAGNOSIS — I5022 Chronic systolic (congestive) heart failure: Secondary | ICD-10-CM

## 2013-08-06 DIAGNOSIS — I2510 Atherosclerotic heart disease of native coronary artery without angina pectoris: Secondary | ICD-10-CM | POA: Diagnosis not present

## 2013-08-06 DIAGNOSIS — Z95810 Presence of automatic (implantable) cardiac defibrillator: Secondary | ICD-10-CM

## 2013-08-06 DIAGNOSIS — I428 Other cardiomyopathies: Secondary | ICD-10-CM | POA: Diagnosis not present

## 2013-08-06 LAB — MDC_IDC_ENUM_SESS_TYPE_INCLINIC
Battery Voltage: 2.65 V
Brady Statistic RV Percent Paced: 0 %
HIGH POWER IMPEDANCE MEASURED VALUE: 40 Ohm
HIGH POWER IMPEDANCE MEASURED VALUE: 40 Ohm
HIGH POWER IMPEDANCE MEASURED VALUE: 40 Ohm
HIGH POWER IMPEDANCE MEASURED VALUE: 41 Ohm
HIGH POWER IMPEDANCE MEASURED VALUE: 41 Ohm
HIGH POWER IMPEDANCE MEASURED VALUE: 50 Ohm
HIGH POWER IMPEDANCE MEASURED VALUE: 50 Ohm
HIGH POWER IMPEDANCE MEASURED VALUE: 51 Ohm
HIGH POWER IMPEDANCE MEASURED VALUE: 53 Ohm
HIGH POWER IMPEDANCE MEASURED VALUE: 54 Ohm
HIGH POWER IMPEDANCE MEASURED VALUE: 56 Ohm
HighPow Impedance: 39 Ohm
HighPow Impedance: 39 Ohm
HighPow Impedance: 40 Ohm
HighPow Impedance: 40 Ohm
HighPow Impedance: 40 Ohm
HighPow Impedance: 41 Ohm
HighPow Impedance: 41 Ohm
HighPow Impedance: 42 Ohm
HighPow Impedance: 42 Ohm
HighPow Impedance: 44 Ohm
HighPow Impedance: 45 Ohm
HighPow Impedance: 48 Ohm
HighPow Impedance: 48 Ohm
HighPow Impedance: 49 Ohm
HighPow Impedance: 49 Ohm
HighPow Impedance: 49 Ohm
HighPow Impedance: 50 Ohm
HighPow Impedance: 51 Ohm
HighPow Impedance: 51 Ohm
HighPow Impedance: 52 Ohm
Lead Channel Impedance Value: 424 Ohm
Lead Channel Impedance Value: 440 Ohm
Lead Channel Impedance Value: 440 Ohm
Lead Channel Impedance Value: 440 Ohm
Lead Channel Impedance Value: 448 Ohm
Lead Channel Impedance Value: 448 Ohm
Lead Channel Impedance Value: 456 Ohm
Lead Channel Impedance Value: 456 Ohm
Lead Channel Impedance Value: 456 Ohm
Lead Channel Impedance Value: 480 Ohm
Lead Channel Impedance Value: 480 Ohm
Lead Channel Pacing Threshold Amplitude: 1.5 V
Lead Channel Pacing Threshold Pulse Width: 0.2 ms
Lead Channel Sensing Intrinsic Amplitude: 5.7 mV
Lead Channel Sensing Intrinsic Amplitude: 6 mV
Lead Channel Sensing Intrinsic Amplitude: 6.2 mV
Lead Channel Sensing Intrinsic Amplitude: 6.5 mV
Lead Channel Sensing Intrinsic Amplitude: 6.6 mV
Lead Channel Sensing Intrinsic Amplitude: 6.7 mV
Lead Channel Sensing Intrinsic Amplitude: 7 mV
Lead Channel Sensing Intrinsic Amplitude: 8 mV
Lead Channel Setting Pacing Amplitude: 3 V
Lead Channel Setting Pacing Pulse Width: 0.4 ms
Lead Channel Setting Sensing Sensitivity: 0.3 mV
MDC IDC MSMT LEADCHNL RV IMPEDANCE VALUE: 448 Ohm
MDC IDC MSMT LEADCHNL RV IMPEDANCE VALUE: 448 Ohm
MDC IDC MSMT LEADCHNL RV IMPEDANCE VALUE: 456 Ohm
MDC IDC MSMT LEADCHNL RV IMPEDANCE VALUE: 472 Ohm
MDC IDC MSMT LEADCHNL RV SENSING INTR AMPL: 11 mV
MDC IDC MSMT LEADCHNL RV SENSING INTR AMPL: 5.4 mV
MDC IDC MSMT LEADCHNL RV SENSING INTR AMPL: 5.7 mV
MDC IDC MSMT LEADCHNL RV SENSING INTR AMPL: 6 mV
MDC IDC MSMT LEADCHNL RV SENSING INTR AMPL: 6.1 mV
MDC IDC MSMT LEADCHNL RV SENSING INTR AMPL: 7.1 mV
MDC IDC MSMT LEADCHNL RV SENSING INTR AMPL: 7.7 mV
MDC IDC SESS DTM: 20150326114508
MDC IDC SET ZONE DETECTION INTERVAL: 240 ms
MDC IDC SET ZONE DETECTION INTERVAL: 300 ms
Zone Setting Detection Interval: 370 ms

## 2013-08-06 NOTE — Progress Notes (Signed)
HPI [name] returns today for followup He is a pleasant 80 yo man with an ICM, chronic systolic CHF, and HTN. In the interim, he has done well. He has not been in the hospital. He denies chest pain, or shortness of breath. He remains active, working in his garden. He denies peripheral edema, syncope, or ICD shock. Allergies  Allergen Reactions  . Codeine      Current Outpatient Prescriptions  Medication Sig Dispense Refill  . acetaminophen (TYLENOL) 500 MG tablet Take 500 mg by mouth every 6 (six) hours as needed for mild pain.       Marland Kitchen aspirin EC 81 MG tablet Take 162 mg by mouth daily. Take two tablets daily--this will be 162mg  daily      . carvedilol (COREG) 12.5 MG tablet TAKE 1 AND 1/2 TABLETS BY MOUTH TWICE A DAY  90 tablet  3  . cetirizine (ZYRTEC) 10 MG tablet Take 10 mg by mouth daily as needed for allergies.       . Cholecalciferol (VITAMIN D3) 1000 UNITS CAPS Take 1 capsule by mouth daily.       . hydrochlorothiazide (HYDRODIURIL) 25 MG tablet take 1/2 tablet once daily  15 tablet  5  . losartan (COZAAR) 50 MG tablet take 1 and 1/2 tablets by mouth once daily  45 tablet  3  . simvastatin (ZOCOR) 40 MG tablet TAKE ONE TABLET BY MOUTH EVERY EVENING  30 tablet  0  . spironolactone (ALDACTONE) 25 MG tablet take 1/2 tablet once daily  15 tablet  5  . vitamin C (ASCORBIC ACID) 500 MG tablet Take 500 mg by mouth daily.         No current facility-administered medications for this visit.     Past Medical History  Diagnosis Date  . Coronary artery disease     s/p CABG 8/06 LIMA-LAD, SVG-CFX, SVG-distal RCA  . Nephrolithiasis     hx of  . Systolic CHF     systolic  . Hypertension   . Prostate cancer   . Hyperlipidemia     mixed  . ICD (implantable cardiac defibrillator) in place   . BPH (benign prostatic hypertrophy)     ROS:   All systems reviewed and negative except as noted in the HPI.   Past Surgical History  Procedure Laterality Date  . Prostate surgery    .  Coronary artery bypass graft      8/06 with left anterior descending artery, saphenous vein graft to circumflex, saphenous vein graft to distal right coronary artery  . Colonoscopy    . Polypectomy    . Hernia repair    . Cardiac defibrillator placement       Family History  Problem Relation Age of Onset  . Coronary artery disease       History   Social History  . Marital Status: Married    Spouse Name: N/A    Number of Children: N/A  . Years of Education: N/A   Occupational History  . Retired    Social History Main Topics  . Smoking status: Former Smoker    Types: Cigarettes    Quit date: 05/14/1992  . Smokeless tobacco: Never Used  . Alcohol Use: No  . Drug Use: No  . Sexual Activity: Not on file   Other Topics Concern  . Not on file   Social History Narrative  . No narrative on file     BP 118/61  Pulse 59  Ht 5\' 4"  (  1.626 m)  Wt 193 lb (87.544 kg)  BMI 33.11 kg/m2  Physical Exam:  Well appearing 80 year old man, NAD HEENT: Unremarkable Neck:  6 cm JVD, no thyromegally Lungs:  Clear with no wheezes, rales, or rhonchi. HEART:  Regular rate rhythm, no murmurs, no rubs, no clicks Abd:  soft, positive bowel sounds, no organomegally, no rebound, no guarding Ext:  2 plus pulses, no edema, no cyanosis, no clubbing Skin:  No rashes no nodules Neuro:  CN II through XII intact, motor grossly intact  DEVICE  Normal device function.  See PaceArt for details.   Assess/Plan:

## 2013-08-06 NOTE — Assessment & Plan Note (Signed)
His symptoms are class 2. He will continue his current meds and I have recommended he reduce his sodium intake. No change in medical therapy

## 2013-08-06 NOTE — Assessment & Plan Note (Signed)
His Medtronic ICD is working normally. Will recheck in several months. He is approaching ERI and I have discussed the pro's and con's of placing a new device versus not reimplanting when he reaches ERI.

## 2013-08-06 NOTE — Patient Instructions (Signed)
Your physician wants you to follow-up in: 12 months with Dr Knox Saliva will receive a reminder letter in the mail two months in advance. If you don't receive a letter, please call our office to schedule the follow-up appointment.    Remote monitoring is used to monitor your Pacemaker of ICD from home. This monitoring reduces the number of office visits required to check your device to one time per year. It allows [place] to keep an eye on the functioning of your device to ensure it is working properly. You are scheduled for a device check from home on 11/09/13. You may send your transmission at any time that day. If you have a wireless device, the transmission will be sent automatically. After your physician reviews your transmission, you will receive a postcard with your next transmission date.

## 2013-08-12 ENCOUNTER — Encounter: Payer: Self-pay | Admitting: Internal Medicine

## 2013-08-19 ENCOUNTER — Ambulatory Visit (HOSPITAL_COMMUNITY)
Admission: RE | Admit: 2013-08-19 | Discharge: 2013-08-19 | Disposition: A | Discharge status: Home / Self Care | Payer: Medicare Other | Source: Ambulatory Visit | Attending: Internal Medicine | Admitting: Internal Medicine

## 2013-08-19 ENCOUNTER — Encounter (HOSPITAL_COMMUNITY)
Admission: RE | Disposition: A | Discharge status: Home / Self Care | Payer: Self-pay | Source: Ambulatory Visit | Attending: Internal Medicine

## 2013-08-19 ENCOUNTER — Encounter (HOSPITAL_COMMUNITY): Payer: Self-pay | Admitting: *Deleted

## 2013-08-19 DIAGNOSIS — I10 Essential (primary) hypertension: Secondary | ICD-10-CM | POA: Diagnosis not present

## 2013-08-19 DIAGNOSIS — E785 Hyperlipidemia, unspecified: Secondary | ICD-10-CM | POA: Insufficient documentation

## 2013-08-19 DIAGNOSIS — K5730 Diverticulosis of large intestine without perforation or abscess without bleeding: Secondary | ICD-10-CM | POA: Diagnosis not present

## 2013-08-19 DIAGNOSIS — Z7982 Long term (current) use of aspirin: Secondary | ICD-10-CM | POA: Insufficient documentation

## 2013-08-19 DIAGNOSIS — Z860100 Personal history of colon polyps, unspecified: Secondary | ICD-10-CM

## 2013-08-19 DIAGNOSIS — Z800 Family history of malignant neoplasm of digestive organs: Secondary | ICD-10-CM | POA: Diagnosis not present

## 2013-08-19 DIAGNOSIS — K649 Unspecified hemorrhoids: Secondary | ICD-10-CM | POA: Insufficient documentation

## 2013-08-19 DIAGNOSIS — Z951 Presence of aortocoronary bypass graft: Secondary | ICD-10-CM | POA: Insufficient documentation

## 2013-08-19 DIAGNOSIS — D126 Benign neoplasm of colon, unspecified: Secondary | ICD-10-CM | POA: Diagnosis not present

## 2013-08-19 DIAGNOSIS — Z09 Encounter for follow-up examination after completed treatment for conditions other than malignant neoplasm: Secondary | ICD-10-CM | POA: Insufficient documentation

## 2013-08-19 DIAGNOSIS — Z79899 Other long term (current) drug therapy: Secondary | ICD-10-CM | POA: Insufficient documentation

## 2013-08-19 HISTORY — PX: COLONOSCOPY: SHX5424

## 2013-08-19 SURGERY — COLONOSCOPY
Anesthesia: Moderate Sedation

## 2013-08-19 MED ORDER — STERILE WATER FOR IRRIGATION IR SOLN
Status: DC | PRN
  Administered 2013-08-19: 10:00:00

## 2013-08-19 MED ORDER — MIDAZOLAM HCL 5 MG/5ML IJ SOLN
INTRAMUSCULAR | Status: AC
  Filled 2013-08-19: qty 10

## 2013-08-19 MED ORDER — MEPERIDINE HCL 50 MG/ML IJ SOLN
INTRAMUSCULAR | Status: DC | PRN
  Administered 2013-08-19: 25 mg via INTRAVENOUS

## 2013-08-19 MED ORDER — MIDAZOLAM HCL 5 MG/5ML IJ SOLN
INTRAMUSCULAR | Status: DC | PRN
  Administered 2013-08-19: 1 mg via INTRAVENOUS
  Administered 2013-08-19: 2 mg via INTRAVENOUS

## 2013-08-19 MED ORDER — SODIUM CHLORIDE 0.9 % IV SOLN
INTRAVENOUS | Status: DC
  Administered 2013-08-19: 09:00:00 via INTRAVENOUS

## 2013-08-19 MED ORDER — MEPERIDINE HCL 50 MG/ML IJ SOLN
INTRAMUSCULAR | Status: AC
  Filled 2013-08-19: qty 1

## 2013-08-19 NOTE — Op Note (Signed)
COLONOSCOPY PROCEDURE REPORT  PATIENT:  [name]  MR#:  [id number] Birthdate:  10-22-1926, 80 y.o., male Endoscopist:  Dr. Rogene Houston, MD Referred By:  Dr. Jasper Loser. Luan Pulling, MD Procedure Date: 08/19/2013  Procedure:   Colonoscopy  Indications:  Patient is an 80 year old Caucasian male with history of multiple colonic adenomas and family history of colon carcinoma in his father who is here for surveillance colonoscopy.  Informed Consent:  The procedure and risks were reviewed with the patient and informed consent was obtained.  Medications:  Demerol 25 mg IV Versed 3 mg IV  Description of procedure:  After a digital rectal exam was performed, that colonoscope was advanced from the anus through the rectum and colon to the area of the cecum, ileocecal valve and appendiceal orifice. The cecum was deeply intubated. These structures were well-seen and photographed for the record. From the level of the cecum and ileocecal valve, the scope was slowly and cautiously withdrawn. The mucosal surfaces were carefully surveyed utilizing scope tip to flexion to facilitate fold flattening as needed. The scope was pulled down into the rectum where a thorough exam including retroflexion was performed.  Findings:   Prep excellent. Four small polyps were ablated via cold biopsy and submitted together. Three  were located at descending colon and one at sigmoid colon. Few small diverticula at sigmoid colon. Normal rectal mucosa. Small hemorrhoids below the dentate line.   Therapeutic/Diagnostic Maneuvers Performed:  See above  Complications:  None  Cecal Withdrawal Time:  11 minutes  Impression:  Examination performed to cecum. Four small polyps ablated via cold biopsy and submitted together(3 at descending colon and one at sigmoid). Mild sigmoid colon diverticulosis. Small external hemorrhoid  Recommendations:  Standard instructions given. I will contact patient with biopsy results and  further recommendations.  Rogene Houston  08/19/2013 10:05 AM  CC: Dr. Alonza Bogus, MD & Dr. Rayne Du ref. provider found

## 2013-08-19 NOTE — Discharge Instructions (Signed)
Resume usual medications and diet. °No driving for 24 hours. °Physician will call with biopsy results. ° °Colon Polyps °Polyps are lumps of extra tissue growing inside the body. Polyps can grow in the large intestine (colon). Most colon polyps are noncancerous (benign). However, some colon polyps can become cancerous over time. Polyps that are larger than a pea may be harmful. To be safe, caregivers remove and test all polyps. °CAUSES  °Polyps form when mutations in the genes cause your cells to grow and divide even though no more tissue is needed. °RISK FACTORS °There are a number of risk factors that can increase your chances of getting colon polyps. They include: °· Being older than 50 years. °· Family history of colon polyps or colon cancer. °· Long-term colon diseases, such as colitis or Crohn disease. °· Being overweight. °· Smoking. °· Being inactive. °· Drinking too much alcohol. °SYMPTOMS  °Most small polyps do not cause symptoms. If symptoms are present, they may include: °· Blood in the stool. The stool may look dark red or black. °· Constipation or diarrhea that lasts longer than 1 week. °DIAGNOSIS °People often do not know they have polyps until their caregiver finds them during a regular checkup. Your caregiver can use 4 tests to check for polyps: °· Digital rectal exam. The caregiver wears gloves and feels inside the rectum. This test would find polyps only in the rectum. °· Barium enema. The caregiver puts a liquid called barium into your rectum before taking X-rays of your colon. Barium makes your colon look white. Polyps are dark, so they are easy to see in the X-ray pictures. °· Sigmoidoscopy. A thin, flexible tube (sigmoidoscope) is placed into your rectum. The sigmoidoscope has a light and tiny camera in it. The caregiver uses the sigmoidoscope to look at the last third of your colon. °· Colonoscopy. This test is like sigmoidoscopy, but the caregiver looks at the entire colon. This is the most  common method for finding and removing polyps. °TREATMENT  °Any polyps will be removed during a sigmoidoscopy or colonoscopy. The polyps are then tested for cancer. °PREVENTION  °To help lower your risk of getting more colon polyps: °· Eat plenty of fruits and vegetables. Avoid eating fatty foods. °· Do not smoke. °· Avoid drinking alcohol. °· Exercise every day. °· Lose weight if recommended by your caregiver. °· Eat plenty of calcium and folate. Foods that are rich in calcium include milk, cheese, and broccoli. Foods that are rich in folate include chickpeas, kidney beans, and spinach. °HOME CARE INSTRUCTIONS °Keep all follow-up appointments as directed by your caregiver. You may need periodic exams to check for polyps. °SEEK MEDICAL CARE IF: °You notice bleeding during a bowel movement. °Document Released: 01/25/2004 Document Revised: 07/23/2011 Document Reviewed: 07/10/2011 °ExitCare® Patient Information ©2014 [employer]. °Colonoscopy, Care After °Refer to this sheet in the next few weeks. These instructions provide you with information on caring for yourself after your procedure. Your health care provider may also give you more specific instructions. Your treatment has been planned according to current medical practices, but problems sometimes occur. Call your health care provider if you have any problems or questions after your procedure. °WHAT TO EXPECT AFTER THE PROCEDURE  °After your procedure, it is typical to have the following: °· A small amount of blood in your stool. °· Moderate amounts of gas and mild abdominal cramping or bloating. °HOME CARE INSTRUCTIONS °· Do not drive, operate machinery, or sign important documents for 24 hours. °·   You may shower and resume your regular physical activities, but move at a slower pace for the first 24 hours. °· Take frequent rest periods for the first 24 hours. °· Walk around or put a warm pack on your abdomen to help reduce abdominal cramping and  bloating. °· Drink enough fluids to keep your urine clear or pale yellow. °· You may resume your normal diet as instructed by your health care provider. Avoid heavy or fried foods that are hard to digest. °· Avoid drinking alcohol for 24 hours or as instructed by your health care provider. °· Only take over-the-counter or prescription medicines as directed by your health care provider. °· If a tissue sample (biopsy) was taken during your procedure: °· Do not take aspirin or blood thinners for 7 days, or as instructed by your health care provider. °· Do not drink alcohol for 7 days, or as instructed by your health care provider. °· Eat soft foods for the first 24 hours. °SEEK MEDICAL CARE IF: °You have persistent spotting of blood in your stool 2 3 days after the procedure. °SEEK IMMEDIATE MEDICAL CARE IF: °· You have more than a small spotting of blood in your stool. °· You pass large blood clots in your stool. °· Your abdomen is swollen (distended). °· You have nausea or vomiting. °· You have a fever. °· You have increasing abdominal pain that is not relieved with medicine. °Document Released: 12/13/2003 Document Revised: 02/18/2013 Document Reviewed: 01/05/2013 °ExitCare® Patient Information ©2014 [employer]. ° °

## 2013-08-19 NOTE — H&P (Signed)
[name] is an 80 y.o. male.   Chief Complaint: Patient is  here for colonoscopy. HPI: Patient is an 80 year old Caucasian male was history of colonic adenomas and family history of colon carcinoma. Patient's last colonoscopy was in February 20/10 with removal of multiple tubular adenomas. She denies abdominal pain change in his bowel habits or rectal bleeding. Family history significant for colon carcinoma in his father who is 36 at the time of diagnosis and died within a year.  Past Medical History  Diagnosis Date  . Coronary artery disease     s/p CABG 8/06 LIMA-LAD, SVG-CFX, SVG-distal RCA  . Nephrolithiasis     hx of  . Systolic CHF     systolic  . Hypertension   . Prostate cancer   . Hyperlipidemia     mixed  . ICD (implantable cardiac defibrillator) in place   . BPH (benign prostatic hypertrophy)     Past Surgical History  Procedure Laterality Date  . Prostate surgery    . Coronary artery bypass graft      8/06 with left anterior descending artery, saphenous vein graft to circumflex, saphenous vein graft to distal right coronary artery  . Colonoscopy    . Polypectomy    . Hernia repair    . Cardiac defibrillator placement      Family History  Problem Relation Age of Onset  . Coronary artery disease     Social History:  reports that he quit smoking about 21 years ago. His smoking use included Cigarettes. He smoked 0.00 packs per day. He has never used smokeless tobacco. He reports that he does not drink alcohol or use illicit drugs.  Allergies:  Allergies  Allergen Reactions  . Codeine     Medications Prior to Admission  Medication Sig Dispense Refill  . acetaminophen (TYLENOL) 500 MG tablet Take 500 mg by mouth every 6 (six) hours as needed for mild pain.       Marland Kitchen aspirin EC 81 MG tablet Take 162 mg by mouth daily. Take two tablets daily--this will be 162mg  daily      . carvedilol (COREG) 12.5 MG tablet TAKE 1 AND 1/2 TABLETS BY MOUTH TWICE A DAY  90 tablet   3  . cetirizine (ZYRTEC) 10 MG tablet Take 10 mg by mouth daily as needed for allergies.       . Cholecalciferol (VITAMIN D3) 1000 UNITS CAPS Take 1 capsule by mouth daily.       . hydrochlorothiazide (HYDRODIURIL) 25 MG tablet take 1/2 tablet once daily  15 tablet  5  . losartan (COZAAR) 50 MG tablet take 1 and 1/2 tablets by mouth once daily  45 tablet  3  . simvastatin (ZOCOR) 40 MG tablet TAKE ONE TABLET BY MOUTH EVERY EVENING  30 tablet  0  . spironolactone (ALDACTONE) 25 MG tablet take 1/2 tablet once daily  15 tablet  5  . vitamin C (ASCORBIC ACID) 500 MG tablet Take 500 mg by mouth daily.          No results found for this or any previous visit (from the past 48 hour(s)). No results found.  ROS  Blood pressure 154/74, pulse 72, temperature 97.5 F (36.4 C), temperature source Oral, resp. rate 20, height 5\' 4"  (1.626 m), weight 193 lb (87.544 kg), SpO2 95.00%. Physical Exam  Constitutional: He appears well-developed and well-nourished.  HENT:  Mouth/Throat: Oropharynx is clear and moist.  Eyes: Conjunctivae are normal. No scleral icterus.  Neck: No  thyromegaly present.  Cardiovascular: Normal rate, regular rhythm and normal heart sounds.   No murmur heard. Respiratory: Effort normal and breath sounds normal.  GI: Soft. He exhibits no distension and no mass. There is no tenderness.  Musculoskeletal: He exhibits no edema.  Lymphadenopathy:    He has no cervical adenopathy.  Neurological: He is alert.  Skin: Skin is warm and dry.     Assessment/Plan History of colonic adenomas. Family history of CRC. Surveillance colonoscopy.  [employer] 08/19/2013, 9:29 AM

## 2013-08-21 ENCOUNTER — Encounter (HOSPITAL_COMMUNITY): Payer: Self-pay | Admitting: Internal Medicine

## 2013-08-24 ENCOUNTER — Encounter (INDEPENDENT_AMBULATORY_CARE_PROVIDER_SITE_OTHER): Payer: Self-pay | Admitting: *Deleted

## 2013-09-08 DIAGNOSIS — M109 Gout, unspecified: Secondary | ICD-10-CM | POA: Diagnosis not present

## 2013-09-21 DIAGNOSIS — M109 Gout, unspecified: Secondary | ICD-10-CM | POA: Diagnosis not present

## 2013-09-21 DIAGNOSIS — I259 Chronic ischemic heart disease, unspecified: Secondary | ICD-10-CM | POA: Diagnosis not present

## 2013-10-06 DIAGNOSIS — M109 Gout, unspecified: Secondary | ICD-10-CM | POA: Diagnosis not present

## 2013-10-18 ENCOUNTER — Other Ambulatory Visit: Payer: Self-pay | Admitting: Cardiology

## 2013-10-26 ENCOUNTER — Other Ambulatory Visit: Payer: Medicare Other

## 2013-10-27 DIAGNOSIS — I110 Hypertensive heart disease with heart failure: Secondary | ICD-10-CM | POA: Diagnosis not present

## 2013-10-27 DIAGNOSIS — M109 Gout, unspecified: Secondary | ICD-10-CM | POA: Diagnosis not present

## 2013-10-27 DIAGNOSIS — I259 Chronic ischemic heart disease, unspecified: Secondary | ICD-10-CM | POA: Diagnosis not present

## 2013-10-27 DIAGNOSIS — E785 Hyperlipidemia, unspecified: Secondary | ICD-10-CM | POA: Diagnosis not present

## 2013-10-29 DIAGNOSIS — I509 Heart failure, unspecified: Secondary | ICD-10-CM | POA: Diagnosis not present

## 2013-10-29 DIAGNOSIS — E785 Hyperlipidemia, unspecified: Secondary | ICD-10-CM | POA: Diagnosis not present

## 2013-10-29 DIAGNOSIS — Z125 Encounter for screening for malignant neoplasm of prostate: Secondary | ICD-10-CM | POA: Diagnosis not present

## 2013-10-29 DIAGNOSIS — I110 Hypertensive heart disease with heart failure: Secondary | ICD-10-CM | POA: Diagnosis not present

## 2013-10-29 DIAGNOSIS — I259 Chronic ischemic heart disease, unspecified: Secondary | ICD-10-CM | POA: Diagnosis not present

## 2013-10-29 DIAGNOSIS — M109 Gout, unspecified: Secondary | ICD-10-CM | POA: Diagnosis not present

## 2013-10-29 LAB — PSA: PSA: 0.17

## 2013-11-09 ENCOUNTER — Ambulatory Visit (INDEPENDENT_AMBULATORY_CARE_PROVIDER_SITE_OTHER): Payer: Medicare Other | Admitting: *Deleted

## 2013-11-09 DIAGNOSIS — I5022 Chronic systolic (congestive) heart failure: Secondary | ICD-10-CM

## 2013-11-09 DIAGNOSIS — I2589 Other forms of chronic ischemic heart disease: Secondary | ICD-10-CM | POA: Diagnosis not present

## 2013-11-09 NOTE — Progress Notes (Signed)
Remote ICD transmission.   

## 2013-11-10 LAB — MDC_IDC_ENUM_SESS_TYPE_REMOTE
Battery Voltage: 2.64 V
Brady Statistic RV Percent Paced: 0 %
Date Time Interrogation Session: 20150629134500
HighPow Impedance: 45 Ohm
Lead Channel Impedance Value: 432 Ohm
Lead Channel Sensing Intrinsic Amplitude: 6.1 mV
Lead Channel Setting Pacing Amplitude: 3 V
Lead Channel Setting Pacing Pulse Width: 0.4 ms
Lead Channel Setting Sensing Sensitivity: 0.3 mV
MDC IDC SET ZONE DETECTION INTERVAL: 240 ms
Zone Setting Detection Interval: 300 ms
Zone Setting Detection Interval: 370 ms

## 2013-11-16 ENCOUNTER — Other Ambulatory Visit: Payer: Self-pay | Admitting: Cardiology

## 2013-11-18 ENCOUNTER — Encounter: Payer: Self-pay | Admitting: Cardiology

## 2013-11-24 ENCOUNTER — Encounter: Payer: Self-pay | Admitting: Internal Medicine

## 2013-12-08 DIAGNOSIS — M109 Gout, unspecified: Secondary | ICD-10-CM | POA: Diagnosis not present

## 2013-12-08 DIAGNOSIS — I509 Heart failure, unspecified: Secondary | ICD-10-CM | POA: Diagnosis not present

## 2013-12-08 DIAGNOSIS — I259 Chronic ischemic heart disease, unspecified: Secondary | ICD-10-CM | POA: Diagnosis not present

## 2013-12-08 DIAGNOSIS — N19 Unspecified kidney failure: Secondary | ICD-10-CM | POA: Diagnosis not present

## 2013-12-10 ENCOUNTER — Ambulatory Visit: Payer: Medicare Other | Admitting: *Deleted

## 2013-12-15 ENCOUNTER — Other Ambulatory Visit: Payer: Self-pay | Admitting: Cardiology

## 2013-12-16 LAB — MDC_IDC_ENUM_SESS_TYPE_REMOTE
Battery Voltage: 2.64 V
Brady Statistic RV Percent Paced: 0 %
Date Time Interrogation Session: 20150730152600
HighPow Impedance: 45 Ohm
Lead Channel Sensing Intrinsic Amplitude: 6.6 mV
Lead Channel Setting Pacing Amplitude: 3 V
Lead Channel Setting Pacing Pulse Width: 0.4 ms
Lead Channel Setting Sensing Sensitivity: 0.3 mV
MDC IDC MSMT LEADCHNL RV IMPEDANCE VALUE: 432 Ohm
MDC IDC SET ZONE DETECTION INTERVAL: 300 ms
Zone Setting Detection Interval: 240 ms
Zone Setting Detection Interval: 370 ms

## 2013-12-18 ENCOUNTER — Encounter: Payer: Self-pay | Admitting: Cardiology

## 2013-12-25 ENCOUNTER — Encounter: Payer: Self-pay | Admitting: Internal Medicine

## 2014-01-11 ENCOUNTER — Ambulatory Visit (INDEPENDENT_AMBULATORY_CARE_PROVIDER_SITE_OTHER): Payer: Medicare Other | Admitting: *Deleted

## 2014-01-11 DIAGNOSIS — Z95810 Presence of automatic (implantable) cardiac defibrillator: Secondary | ICD-10-CM

## 2014-01-11 NOTE — Progress Notes (Signed)
Remote ICD transmission.   

## 2014-01-12 LAB — MDC_IDC_ENUM_SESS_TYPE_REMOTE
Battery Voltage: 2.64 V
Date Time Interrogation Session: 20150831151700
HighPow Impedance: 45 Ohm
Lead Channel Impedance Value: 456 Ohm
Lead Channel Sensing Intrinsic Amplitude: 8.4 mV
Lead Channel Setting Pacing Amplitude: 3 V
Lead Channel Setting Sensing Sensitivity: 0.3 mV
MDC IDC SET LEADCHNL RV PACING PULSEWIDTH: 0.4 ms
MDC IDC SET ZONE DETECTION INTERVAL: 370 ms
MDC IDC STAT BRADY RV PERCENT PACED: 0 %
Zone Setting Detection Interval: 240 ms
Zone Setting Detection Interval: 300 ms

## 2014-01-13 ENCOUNTER — Other Ambulatory Visit: Payer: Self-pay | Admitting: Cardiology

## 2014-01-21 ENCOUNTER — Encounter: Payer: Self-pay | Admitting: Cardiology

## 2014-01-21 ENCOUNTER — Ambulatory Visit (INDEPENDENT_AMBULATORY_CARE_PROVIDER_SITE_OTHER): Payer: Medicare Other | Admitting: Cardiology

## 2014-01-21 VITALS — BP 122/68 | HR 60 | Ht 64.0 in | Wt 187.0 lb

## 2014-01-21 DIAGNOSIS — I10 Essential (primary) hypertension: Secondary | ICD-10-CM | POA: Diagnosis not present

## 2014-01-21 DIAGNOSIS — I5022 Chronic systolic (congestive) heart failure: Secondary | ICD-10-CM

## 2014-01-21 DIAGNOSIS — I2510 Atherosclerotic heart disease of native coronary artery without angina pectoris: Secondary | ICD-10-CM | POA: Diagnosis not present

## 2014-01-21 LAB — BASIC METABOLIC PANEL
BUN: 24 mg/dL — ABNORMAL HIGH (ref 6–23)
CALCIUM: 9.5 mg/dL (ref 8.4–10.5)
CO2: 29 mEq/L (ref 19–32)
Chloride: 103 mEq/L (ref 96–112)
Creatinine, Ser: 1.2 mg/dL (ref 0.4–1.5)
GFR: 60.55 mL/min (ref >60.00)
Glucose, Bld: 88 mg/dL (ref 70–99)
POTASSIUM: 4.4 meq/L (ref 3.5–5.1)
SODIUM: 136 meq/L (ref 135–145)

## 2014-01-21 NOTE — Patient Instructions (Signed)
Your physician recommends that you have  lab work today--BMET.    Your physician wants you to follow-up in: 6 months with Dr Aundra Dubin in the [facility].  You will receive a reminder letter in the mail two months in advance. If you don't receive a letter, please call our office to schedule the follow-up appointment.

## 2014-01-22 NOTE — Progress Notes (Signed)
Patient ID: [name], male   DOB: [birth year], 80 y.o.   MRN: [id number] PCP: Dr. Luan Pulling  80 yo with history of CAD s/p CABG and ischemic cardiomyopathy presents for cardiology followup.  He has been doing well since I last saw him.  He does not get short of breath when he walks typically. He can walk up to a mile and walks in the mall for exercise. He does get mild shortness of breath with more heavy exertion such as walking up hills.  He is more limited by his right hip pain. No chest pain. Weight has been stable.    ECG: NSR, LAFB, RBBB  Labs (10/11): K 4.2, creatinine 1.3, BNP 62  Labs (2/12): K 4.7, creatinine 1.2, BNP 49, LDL 66, HDL 35 Labs (10/12): K 4.7, creatinine 1.3, LDL 71, HDl 31 Labs (3/13): LDL 60, HDL 39 Labs (7/13): K 5.1=>4.5, creatinine 1.4 Labs (11/13): K 4.9, creatinine 1.3, LDL 60, HDL 31, BNP 46 Labs (3/14): K 4.9, creatinine 1.5, LDL 53, HDL 31 Labs (7/14): K 5.0, creatinine 1.4 Labs (11/14): K 4.8, creatinine 1.3, BNP 87 Labs (3/15): K 4.4, creatinine 1.43  Allergies (verified):  1) ! Codeine   Past Medical History:  1. CAD: s/p CABG 8/06 with LIMA-LAD, SVG-CFX, SVG-distal RCA.  2. NEPHROLITHIASIS  3. SYSTOLIC HEART FAILURE: Ischemic cardiomyopathy with last echo (2011) showing EF 35-40%, mild LV hypertrophy, inferoposterior akinesis, grade I diastolic dysfunction, biatrial enlargement, mild mitral regurgitation. Patient has Medtronic ICD  4. HYPERTENSION 5. PROSTATE CANCER: s/p cryosurgical ablation.  6. HYPERLIPIDEMIA 7. Abnormal chest x-ray with questionable nodule.  8. Medtronic ICD  9. Carotid stenosis: Dopplers 3/13 with 40-59% RICA stenosis. Dopplers (3/14) with 40-59% bilateral ICA stenosis. Carotid dopplers (3/15) with 40-59% bilateral ICA stenosis.  10. Multinodular goiter: noted on thyroid US.  11. CKD 12. Gout  Family History:  CAD   Social History:  He lives with his wife in [place]. Retired Engineer, materials.  He does not  smoke    Current Outpatient Prescriptions  Medication Sig Dispense Refill  . acetaminophen (TYLENOL) 500 MG tablet Take 500 mg by mouth every 6 (six) hours as needed for mild pain.       Marland Kitchen allopurinol (ZYLOPRIM) 300 MG tablet Take 300 mg by mouth daily.       Marland Kitchen aspirin EC 81 MG tablet Take 162 mg by mouth daily. Take two tablets daily--this will be 162mg  daily      . carvedilol (COREG) 12.5 MG tablet TAKE 1 AND 1/2 TABLETS TWICE A DAY.  90 tablet  3  . cetirizine (ZYRTEC) 10 MG tablet Take 10 mg by mouth daily as needed for allergies.       . Cholecalciferol (VITAMIN D3) 1000 UNITS CAPS Take 1 capsule by mouth daily.       . hydrochlorothiazide (HYDRODIURIL) 25 MG tablet take 1/2 tablet by mouth once daily  15 tablet  0  . losartan (COZAAR) 50 MG tablet take 1 and 1/2 tablets by mouth once daily  45 tablet  3  . simvastatin (ZOCOR) 40 MG tablet TAKE ONE TABLET BY MOUTH EVERY EVENING  30 tablet  0  . spironolactone (ALDACTONE) 25 MG tablet Take 0.5 tablets (12.5 mg total) by mouth daily.  15 tablet  3  . vitamin C (ASCORBIC ACID) 500 MG tablet Take 500 mg by mouth daily.         No current facility-administered medications for this visit.    BP 122/68  Pulse 60  Ht 5\' 4"  (1.626 m)  Wt 187 lb (84.823 kg)  BMI 32.08 kg/m2 General: NAD Neck: No JVD, no thyromegaly or thyroid nodule.  Lungs: Clear to auscultation bilaterally with normal respiratory effort. CV: Nondisplaced PMI.  Heart regular S1/S2, no S3/S4, 1/6 SEM RUSB.  No edema.  Right carotid bruit.  Normal pedal pulses.  Abdomen: Soft, nontender, no hepatosplenomegaly, no distention.  Neurologic: Alert and oriented x 3.  Psych: Normal affect. Extremities: No clubbing or cyanosis.   Assessment/Plan:  CAD, AUTOLOGOUS BYPASS GRAFT No exertional chest pain. Continue ASA 81, statin, ARB, beta blocker.  Carotid stenosis  40-59% bilateral ICA stenosis. Repeat in 3/16.    HYPERLIPIDEMIA Lipids done recently by PCP, will get  copy. SYSTOLIC HEART FAILURE, CHRONIC  Stable NYHA class II symptoms. Last echo with EF 35-40%. Has Medtronic ICD. He does not look volume overloaded.  - ICD is nearing ERI.  He is leaning towards having it replaced.  - Continue current Coreg.    - I will not increase losartan or spironolactone today given CKD and high normal K in the past.  I will get BMET today and again in 3 months given spironolactone use.  - Followup in 6 months in CHF clinic.   Loralie Champagne 01/22/2014

## 2014-02-02 ENCOUNTER — Encounter: Payer: Self-pay | Admitting: Internal Medicine

## 2014-02-02 DIAGNOSIS — L570 Actinic keratosis: Secondary | ICD-10-CM | POA: Diagnosis not present

## 2014-02-02 DIAGNOSIS — D485 Neoplasm of uncertain behavior of skin: Secondary | ICD-10-CM | POA: Diagnosis not present

## 2014-02-02 DIAGNOSIS — L821 Other seborrheic keratosis: Secondary | ICD-10-CM | POA: Diagnosis not present

## 2014-02-11 ENCOUNTER — Ambulatory Visit (INDEPENDENT_AMBULATORY_CARE_PROVIDER_SITE_OTHER): Payer: Medicare Other | Admitting: *Deleted

## 2014-02-11 DIAGNOSIS — I5022 Chronic systolic (congestive) heart failure: Secondary | ICD-10-CM

## 2014-02-11 DIAGNOSIS — Z95810 Presence of automatic (implantable) cardiac defibrillator: Secondary | ICD-10-CM

## 2014-02-11 LAB — MDC_IDC_ENUM_SESS_TYPE_REMOTE
Battery Voltage: 2.64 V
Brady Statistic RV Percent Paced: 0 %
Date Time Interrogation Session: 20151001152000
HighPow Impedance: 45 Ohm
Lead Channel Impedance Value: 440 Ohm
Lead Channel Sensing Intrinsic Amplitude: 6.9 mV
Lead Channel Setting Pacing Amplitude: 3 V
Lead Channel Setting Pacing Pulse Width: 0.4 ms
Lead Channel Setting Sensing Sensitivity: 0.3 mV
Zone Setting Detection Interval: 240 ms
Zone Setting Detection Interval: 300 ms
Zone Setting Detection Interval: 370 ms

## 2014-02-11 NOTE — Progress Notes (Signed)
Remote ICD transmission.   

## 2014-02-14 ENCOUNTER — Other Ambulatory Visit: Payer: Self-pay | Admitting: Cardiology

## 2014-02-17 ENCOUNTER — Other Ambulatory Visit: Payer: Self-pay | Admitting: Cardiology

## 2014-02-18 DIAGNOSIS — Z23 Encounter for immunization: Secondary | ICD-10-CM | POA: Diagnosis not present

## 2014-02-26 ENCOUNTER — Encounter: Payer: Self-pay | Admitting: Cardiology

## 2014-03-11 ENCOUNTER — Encounter: Payer: Self-pay | Admitting: Internal Medicine

## 2014-03-15 ENCOUNTER — Ambulatory Visit (INDEPENDENT_AMBULATORY_CARE_PROVIDER_SITE_OTHER): Payer: Medicare Other | Admitting: *Deleted

## 2014-03-15 DIAGNOSIS — Z95810 Presence of automatic (implantable) cardiac defibrillator: Secondary | ICD-10-CM

## 2014-03-15 NOTE — Progress Notes (Signed)
Remote ICD transmission.   

## 2014-03-16 LAB — MDC_IDC_ENUM_SESS_TYPE_REMOTE
Battery Voltage: 2.64 V
Date Time Interrogation Session: 20151102170600
HighPow Impedance: 45 Ohm
Lead Channel Impedance Value: 472 Ohm
Lead Channel Setting Pacing Amplitude: 3 V
Lead Channel Setting Pacing Pulse Width: 0.4 ms
Lead Channel Setting Sensing Sensitivity: 0.3 mV
MDC IDC MSMT LEADCHNL RV SENSING INTR AMPL: 6.5 mV
MDC IDC SET ZONE DETECTION INTERVAL: 240 ms
MDC IDC STAT BRADY RV PERCENT PACED: 0 %
Zone Setting Detection Interval: 300 ms
Zone Setting Detection Interval: 370 ms

## 2014-03-30 DIAGNOSIS — I259 Chronic ischemic heart disease, unspecified: Secondary | ICD-10-CM | POA: Diagnosis not present

## 2014-03-30 DIAGNOSIS — I129 Hypertensive chronic kidney disease with stage 1 through stage 4 chronic kidney disease, or unspecified chronic kidney disease: Secondary | ICD-10-CM | POA: Diagnosis not present

## 2014-03-30 DIAGNOSIS — I509 Heart failure, unspecified: Secondary | ICD-10-CM | POA: Diagnosis not present

## 2014-03-30 DIAGNOSIS — M109 Gout, unspecified: Secondary | ICD-10-CM | POA: Diagnosis not present

## 2014-03-30 DIAGNOSIS — N19 Unspecified kidney failure: Secondary | ICD-10-CM | POA: Diagnosis not present

## 2014-04-01 ENCOUNTER — Encounter: Payer: Self-pay | Admitting: Cardiology

## 2014-04-06 ENCOUNTER — Encounter: Payer: Self-pay | Admitting: Internal Medicine

## 2014-04-13 ENCOUNTER — Other Ambulatory Visit: Payer: Self-pay | Admitting: Internal Medicine

## 2014-04-13 DIAGNOSIS — M109 Gout, unspecified: Secondary | ICD-10-CM | POA: Diagnosis not present

## 2014-04-15 ENCOUNTER — Ambulatory Visit (INDEPENDENT_AMBULATORY_CARE_PROVIDER_SITE_OTHER): Payer: Medicare Other | Admitting: *Deleted

## 2014-04-15 DIAGNOSIS — I5022 Chronic systolic (congestive) heart failure: Secondary | ICD-10-CM

## 2014-04-15 DIAGNOSIS — Z95810 Presence of automatic (implantable) cardiac defibrillator: Secondary | ICD-10-CM

## 2014-04-16 NOTE — Progress Notes (Signed)
Remote ICD transmission.   

## 2014-04-25 LAB — MDC_IDC_ENUM_SESS_TYPE_REMOTE
Battery Voltage: 2.64 V
Brady Statistic RV Percent Paced: 0 %
Date Time Interrogation Session: 20151203165200
HighPow Impedance: 45 Ohm
Lead Channel Sensing Intrinsic Amplitude: 5.1 mV
Lead Channel Sensing Intrinsic Amplitude: 5.1 mV
Lead Channel Setting Pacing Amplitude: 3 V
MDC IDC MSMT LEADCHNL RV IMPEDANCE VALUE: 432 Ohm
MDC IDC SET LEADCHNL RV PACING PULSEWIDTH: 0.4 ms
MDC IDC SET LEADCHNL RV SENSING SENSITIVITY: 0.3 mV
MDC IDC SET ZONE DETECTION INTERVAL: 370 ms
Zone Setting Detection Interval: 240 ms
Zone Setting Detection Interval: 300 ms

## 2014-04-30 ENCOUNTER — Encounter: Payer: Self-pay | Admitting: Cardiology

## 2014-05-04 ENCOUNTER — Encounter: Payer: Self-pay | Admitting: Internal Medicine

## 2014-06-09 ENCOUNTER — Other Ambulatory Visit: Payer: Self-pay | Admitting: Cardiology

## 2014-07-28 DIAGNOSIS — C61 Malignant neoplasm of prostate: Secondary | ICD-10-CM | POA: Diagnosis not present

## 2014-07-29 DIAGNOSIS — I509 Heart failure, unspecified: Secondary | ICD-10-CM | POA: Diagnosis not present

## 2014-07-29 DIAGNOSIS — Z23 Encounter for immunization: Secondary | ICD-10-CM | POA: Diagnosis not present

## 2014-07-29 DIAGNOSIS — I129 Hypertensive chronic kidney disease with stage 1 through stage 4 chronic kidney disease, or unspecified chronic kidney disease: Secondary | ICD-10-CM | POA: Diagnosis not present

## 2014-07-29 DIAGNOSIS — R739 Hyperglycemia, unspecified: Secondary | ICD-10-CM | POA: Diagnosis not present

## 2014-07-29 DIAGNOSIS — I2510 Atherosclerotic heart disease of native coronary artery without angina pectoris: Secondary | ICD-10-CM | POA: Diagnosis not present

## 2014-08-05 ENCOUNTER — Ambulatory Visit (INDEPENDENT_AMBULATORY_CARE_PROVIDER_SITE_OTHER): Payer: Medicare Other | Admitting: Internal Medicine

## 2014-08-05 ENCOUNTER — Encounter: Payer: Self-pay | Admitting: Internal Medicine

## 2014-08-05 DIAGNOSIS — Z95810 Presence of automatic (implantable) cardiac defibrillator: Secondary | ICD-10-CM

## 2014-08-05 DIAGNOSIS — I5022 Chronic systolic (congestive) heart failure: Secondary | ICD-10-CM | POA: Diagnosis not present

## 2014-08-05 LAB — MDC_IDC_ENUM_SESS_TYPE_INCLINIC
Brady Statistic RV Percent Paced: 0 %
Date Time Interrogation Session: 20160324094309
HIGH POWER IMPEDANCE MEASURED VALUE: 38 Ohm
HIGH POWER IMPEDANCE MEASURED VALUE: 38 Ohm
HIGH POWER IMPEDANCE MEASURED VALUE: 38 Ohm
HIGH POWER IMPEDANCE MEASURED VALUE: 39 Ohm
HIGH POWER IMPEDANCE MEASURED VALUE: 39 Ohm
HIGH POWER IMPEDANCE MEASURED VALUE: 39 Ohm
HIGH POWER IMPEDANCE MEASURED VALUE: 39 Ohm
HIGH POWER IMPEDANCE MEASURED VALUE: 39 Ohm
HIGH POWER IMPEDANCE MEASURED VALUE: 45 Ohm
HIGH POWER IMPEDANCE MEASURED VALUE: 48 Ohm
HIGH POWER IMPEDANCE MEASURED VALUE: 48 Ohm
HIGH POWER IMPEDANCE MEASURED VALUE: 50 Ohm
HIGH POWER IMPEDANCE MEASURED VALUE: 50 Ohm
HighPow Impedance: 38 Ohm
HighPow Impedance: 38 Ohm
HighPow Impedance: 38 Ohm
HighPow Impedance: 39 Ohm
HighPow Impedance: 39 Ohm
HighPow Impedance: 39 Ohm
HighPow Impedance: 39 Ohm
HighPow Impedance: 47 Ohm
HighPow Impedance: 48 Ohm
HighPow Impedance: 48 Ohm
HighPow Impedance: 49 Ohm
HighPow Impedance: 49 Ohm
HighPow Impedance: 49 Ohm
HighPow Impedance: 49 Ohm
HighPow Impedance: 49 Ohm
HighPow Impedance: 50 Ohm
HighPow Impedance: 50 Ohm
HighPow Impedance: 51 Ohm
Lead Channel Impedance Value: 408 Ohm
Lead Channel Impedance Value: 432 Ohm
Lead Channel Impedance Value: 432 Ohm
Lead Channel Impedance Value: 440 Ohm
Lead Channel Impedance Value: 440 Ohm
Lead Channel Impedance Value: 440 Ohm
Lead Channel Impedance Value: 448 Ohm
Lead Channel Pacing Threshold Amplitude: 1 V
Lead Channel Pacing Threshold Pulse Width: 0.2 ms
Lead Channel Sensing Intrinsic Amplitude: 5.1 mV
Lead Channel Sensing Intrinsic Amplitude: 5.1 mV
Lead Channel Sensing Intrinsic Amplitude: 5.2 mV
Lead Channel Sensing Intrinsic Amplitude: 5.2 mV
Lead Channel Sensing Intrinsic Amplitude: 5.5 mV
Lead Channel Sensing Intrinsic Amplitude: 5.6 mV
Lead Channel Sensing Intrinsic Amplitude: 5.6 mV
Lead Channel Sensing Intrinsic Amplitude: 5.9 mV
Lead Channel Sensing Intrinsic Amplitude: 5.9 mV
Lead Channel Sensing Intrinsic Amplitude: 6 mV
Lead Channel Sensing Intrinsic Amplitude: 6.1 mV
Lead Channel Sensing Intrinsic Amplitude: 6.5 mV
Lead Channel Sensing Intrinsic Amplitude: 6.8 mV
Lead Channel Setting Pacing Amplitude: 3 V
Lead Channel Setting Sensing Sensitivity: 0.3 mV
MDC IDC MSMT BATTERY VOLTAGE: 2.62 V
MDC IDC MSMT LEADCHNL RV IMPEDANCE VALUE: 416 Ohm
MDC IDC MSMT LEADCHNL RV IMPEDANCE VALUE: 424 Ohm
MDC IDC MSMT LEADCHNL RV IMPEDANCE VALUE: 432 Ohm
MDC IDC MSMT LEADCHNL RV IMPEDANCE VALUE: 440 Ohm
MDC IDC MSMT LEADCHNL RV IMPEDANCE VALUE: 440 Ohm
MDC IDC MSMT LEADCHNL RV IMPEDANCE VALUE: 448 Ohm
MDC IDC MSMT LEADCHNL RV IMPEDANCE VALUE: 456 Ohm
MDC IDC MSMT LEADCHNL RV IMPEDANCE VALUE: 456 Ohm
MDC IDC MSMT LEADCHNL RV SENSING INTR AMPL: 5 mV
MDC IDC MSMT LEADCHNL RV SENSING INTR AMPL: 5.3 mV
MDC IDC SET LEADCHNL RV PACING PULSEWIDTH: 0.4 ms
MDC IDC SET ZONE DETECTION INTERVAL: 370 ms
Zone Setting Detection Interval: 240 ms
Zone Setting Detection Interval: 300 ms

## 2014-08-05 NOTE — Patient Instructions (Signed)
Your physician recommends that you schedule a follow-up appointment ---I will call you with your echo result  Your physician recommends that you schedule a follow-up appointment in: 3-4 weeks with Dr Aundra Dubin  Your physician has requested that you have an echocardiogram. Echocardiography is a painless test that uses sound waves to create images of your heart. It provides your doctor with information about the size and shape of your heart and how well your heart's chambers and valves are working. This procedure takes approximately one hour. There are no restrictions for this procedure.

## 2014-08-05 NOTE — Assessment & Plan Note (Signed)
His symptoms appear to be class 2. He will continue his current meds.

## 2014-08-05 NOTE — Assessment & Plan Note (Signed)
His Medtronic ICD has reached ERI. We had an extensive discussion regarding re-implantation of a new device or whether to forego surgery. I have recommended her undergo 2D echo. If his EF is over 35%, then I would not recommend re-implant. If it is less than 36%, then it would be up to the patient to have a new device.

## 2014-08-05 NOTE — Progress Notes (Signed)
HPI [name] returns today for followup He is a pleasant 81 yo man with an ICM, chronic systolic CHF, and HTN. In the interim, he has done well. He has not been in the hospital. He denies chest pain, or shortness of breath. He remains active, working in his garden. He denies peripheral edema, syncope, or ICD shock. He has reached ERI on his ICD. He has never received an ICD therapy.  Allergies  Allergen Reactions  . Codeine      Current Outpatient Prescriptions  Medication Sig Dispense Refill  . acetaminophen (TYLENOL) 500 MG tablet Take 500 mg by mouth every 6 (six) hours as needed for mild pain.     Marland Kitchen allopurinol (ZYLOPRIM) 300 MG tablet Take 300 mg by mouth daily.     Marland Kitchen aspirin EC 81 MG tablet Take 162 mg by mouth daily. Take two tablets daily--this will be 162mg  daily    . carvedilol (COREG) 12.5 MG tablet take 1 and 1/2 tablet by mouth twice a day 90 tablet 1  . cetirizine (ZYRTEC) 10 MG tablet Take 10 mg by mouth daily as needed for allergies.     . Cholecalciferol (VITAMIN D3) 1000 UNITS CAPS Take 1 capsule by mouth daily.     . hydrochlorothiazide (HYDRODIURIL) 25 MG tablet take 1/2 tablet by mouth once daily 15 tablet 11  . losartan (COZAAR) 50 MG tablet take 1 and 1/2 tablet by mouth once daily 45 tablet 1  . simvastatin (ZOCOR) 40 MG tablet TAKE ONE TABLET BY MOUTH EVERY EVENING 30 tablet 0  . spironolactone (ALDACTONE) 25 MG tablet take 1/2 tablet by mouth once daily 15 tablet 3  . vitamin C (ASCORBIC ACID) 500 MG tablet Take 500 mg by mouth daily.       No current facility-administered medications for this visit.     Past Medical History  Diagnosis Date  . Coronary artery disease     s/p CABG 8/06 LIMA-LAD, SVG-CFX, SVG-distal RCA  . Nephrolithiasis     hx of  . Systolic CHF     systolic  . Hypertension   . Prostate cancer   . Hyperlipidemia     mixed  . ICD (implantable cardiac defibrillator) in place   . BPH (benign prostatic hypertrophy)     ROS:   All  systems reviewed and negative except as noted in the HPI.   Past Surgical History  Procedure Laterality Date  . Prostate surgery    . Coronary artery bypass graft      8/06 with left anterior descending artery, saphenous vein graft to circumflex, saphenous vein graft to distal right coronary artery  . Colonoscopy    . Polypectomy    . Hernia repair    . Cardiac defibrillator placement    . Colonoscopy N/A 08/19/2013    Procedure: COLONOSCOPY;  Surgeon: Rogene Houston, MD;  Location: [facility];  Service: Endoscopy;  Laterality: N/A;  930     Family History  Problem Relation Age of Onset  . Coronary artery disease    . Colon cancer Father      History   Social History  . Marital Status: Married    Spouse Name: N/A  . Number of Children: N/A  . Years of Education: N/A   Occupational History  . Retired    Social History Main Topics  . Smoking status: Former Smoker    Types: Cigarettes    Quit date: 05/14/1992  . Smokeless tobacco: Never Used  .  Alcohol Use: No  . Drug Use: No  . Sexual Activity: Not on file   Other Topics Concern  . Not on file   Social History Narrative     BP 128/52 mmHg  Pulse 60  Ht 5\' 4"  (1.626 m)  Wt 185 lb 12.8 oz (84.278 kg)  BMI 31.88 kg/m2  Physical Exam:  Well appearing 81 year old man, NAD HEENT: Unremarkable Neck:  6 cm JVD, no thyromegally Lungs:  Clear with no wheezes, rales, or rhonchi. HEART:  Regular rate rhythm, no murmurs, no rubs, no clicks Abd:  soft, positive bowel sounds, no organomegally, no rebound, no guarding Ext:  2 plus pulses, no edema, no cyanosis, no clubbing Skin:  No rashes no nodules Neuro:  CN II through XII intact, motor grossly intact  DEVICE  Normal device function.  See PaceArt for details. Device is at [facility].  Assess/Plan:

## 2014-08-05 NOTE — Assessment & Plan Note (Signed)
He denies anginal symptoms. He will continue his current meds. I will refer him back to Dr. Aundra Dubin.

## 2014-08-05 NOTE — Assessment & Plan Note (Signed)
His blood pressure is well controlled. No change in meds.  

## 2014-08-06 ENCOUNTER — Other Ambulatory Visit: Payer: Self-pay | Admitting: Cardiology

## 2014-08-10 DIAGNOSIS — Z961 Presence of intraocular lens: Secondary | ICD-10-CM | POA: Diagnosis not present

## 2014-08-11 ENCOUNTER — Ambulatory Visit (HOSPITAL_COMMUNITY): Payer: Medicare Other | Attending: Pulmonary Disease | Admitting: Cardiology

## 2014-08-11 DIAGNOSIS — I5022 Chronic systolic (congestive) heart failure: Secondary | ICD-10-CM | POA: Diagnosis not present

## 2014-08-11 DIAGNOSIS — Z95810 Presence of automatic (implantable) cardiac defibrillator: Secondary | ICD-10-CM | POA: Insufficient documentation

## 2014-08-11 NOTE — Progress Notes (Signed)
Echo performed. 

## 2014-08-12 ENCOUNTER — Encounter: Payer: Self-pay | Admitting: Internal Medicine

## 2014-09-09 ENCOUNTER — Ambulatory Visit (INDEPENDENT_AMBULATORY_CARE_PROVIDER_SITE_OTHER): Payer: Medicare Other | Admitting: Cardiology

## 2014-09-09 ENCOUNTER — Encounter: Payer: Self-pay | Admitting: *Deleted

## 2014-09-09 ENCOUNTER — Encounter: Payer: Self-pay | Admitting: Cardiology

## 2014-09-09 VITALS — BP 126/66 | HR 64 | Ht 64.0 in | Wt 183.8 lb

## 2014-09-09 DIAGNOSIS — I5022 Chronic systolic (congestive) heart failure: Secondary | ICD-10-CM

## 2014-09-09 DIAGNOSIS — I2510 Atherosclerotic heart disease of native coronary artery without angina pectoris: Secondary | ICD-10-CM

## 2014-09-09 DIAGNOSIS — R0602 Shortness of breath: Secondary | ICD-10-CM

## 2014-09-09 DIAGNOSIS — I6529 Occlusion and stenosis of unspecified carotid artery: Secondary | ICD-10-CM | POA: Diagnosis not present

## 2014-09-09 DIAGNOSIS — I6523 Occlusion and stenosis of bilateral carotid arteries: Secondary | ICD-10-CM | POA: Diagnosis not present

## 2014-09-09 LAB — BASIC METABOLIC PANEL
BUN: 30 mg/dL — ABNORMAL HIGH (ref 6–23)
CALCIUM: 10.1 mg/dL (ref 8.4–10.5)
CHLORIDE: 103 meq/L (ref 96–112)
CO2: 28 meq/L (ref 19–32)
Creatinine, Ser: 1.36 mg/dL (ref 0.40–1.50)
GFR: 52.83 mL/min — ABNORMAL LOW (ref >60.00)
Glucose, Bld: 151 mg/dL — ABNORMAL HIGH (ref 70–99)
Potassium: 4.8 mEq/L (ref 3.5–5.1)
SODIUM: 136 meq/L (ref 135–145)

## 2014-09-09 LAB — LIPID PANEL
Cholesterol: 122 mg/dL (ref 0–200)
HDL: 32.8 mg/dL — AB (ref >39.00)
LDL CALC: 64 mg/dL (ref 0–99)
NONHDL: 89.2
Total CHOL/HDL Ratio: 4
Triglycerides: 125 mg/dL (ref 0.0–149.0)
VLDL: 25 mg/dL (ref 0.0–40.0)

## 2014-09-09 LAB — BRAIN NATRIURETIC PEPTIDE: PRO B NATRI PEPTIDE: 82 pg/mL (ref 0.0–100.0)

## 2014-09-09 NOTE — Patient Instructions (Signed)
Medication Instructions:  No changes today.  Labwork: Lipid profile/BMET/BNp today.  Your physician recommends that you return for lab work in: 3 months--BMET   Testing/Procedures: Your physician has requested that you have a carotid duplex. This test is an ultrasound of the carotid arteries in your neck. It looks at blood flow through these arteries that supply the brain with blood. Allow one hour for this exam. There are no restrictions or special instructions.  Follow-Up: Your physician wants you to follow-up in: 6 months with Dr Aundra Dubin. (October 2016).  You will receive a reminder letter in the mail two months in advance. If you don't receive a letter, please call our office to schedule the follow-up appointment.

## 2014-09-09 NOTE — Progress Notes (Signed)
Patient ID: [name], male   DOB: [birth year], 81 y.o.   MRN: [id number] PCP: Dr. Luan Pulling  81 yo with history of CAD s/p CABG and ischemic cardiomyopathy presents for cardiology followup.  He has been doing well since I last saw him.  He does not get short of breath when he walks typically. He can walk up to a mile and walks in the mall for exercise. He does get mild shortness of breath with more heavy exertion such as walking up hills or gardening.  He is more limited by his right hip pain. No chest pain. Weight has been stable.    His ICD is nearing ERI.  Repeat echo in 3/16 showed EF improved to 45-50%.    ECG: NSR, LAFB, RBBB  Labs (10/11): K 4.2, creatinine 1.3, BNP 62  Labs (2/12): K 4.7, creatinine 1.2, BNP 49, LDL 66, HDL 35 Labs (10/12): K 4.7, creatinine 1.3, LDL 71, HDl 31 Labs (3/13): LDL 60, HDL 39 Labs (7/13): K 5.1=>4.5, creatinine 1.4 Labs (11/13): K 4.9, creatinine 1.3, LDL 60, HDL 31, BNP 46 Labs (3/14): K 4.9, creatinine 1.5, LDL 53, HDL 31 Labs (7/14): K 5.0, creatinine 1.4 Labs (11/14): K 4.8, creatinine 1.3, BNP 87 Labs (3/15): K 4.4, creatinine 1.43 Labs (9/15): K 4.4, creatinine 1.2  Allergies (verified):  1) ! Codeine   Past Medical History:  1. CAD: s/p CABG 8/06 with LIMA-LAD, SVG-CFX, SVG-distal RCA.  2. NEPHROLITHIASIS  3. SYSTOLIC HEART FAILURE: Ischemic cardiomyopathy with last echo (2011) showing EF 35-40%, mild LV hypertrophy, inferoposterior akinesis, grade I diastolic dysfunction, biatrial enlargement, mild mitral regurgitation. Patient has Medtronic ICD.  Echo (3/16) with EF 45-50%, basal inferior akinesis, mild MR, RV mildly dilated with normal systolic function.  4. HYPERTENSION 5. PROSTATE CANCER: s/p cryosurgical ablation.  6. HYPERLIPIDEMIA 7. Abnormal chest x-ray with questionable nodule.  8. Medtronic ICD  9. Carotid stenosis: Dopplers 3/13 with 40-59% RICA stenosis. Dopplers (3/14) with 40-59% bilateral ICA stenosis. Carotid dopplers  (3/15) with 40-59% bilateral ICA stenosis. Carotid dopplers (3/15) with 40-59% bilateral ICA stenosis.  10. Multinodular goiter: noted on thyroid US.  11. CKD 12. Gout  Family History:  CAD   Social History:  He lives with his wife in [place]. 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   Current Outpatient Prescriptions  Medication Sig Dispense Refill  . acetaminophen (TYLENOL) 500 MG tablet Take 500 mg by mouth every 6 (six) hours as needed for mild pain.     Marland Kitchen allopurinol (ZYLOPRIM) 300 MG tablet Take 300 mg by mouth daily.     Marland Kitchen aspirin EC 81 MG tablet Take 162 mg by mouth daily. Take two tablets daily--this will be 162mg  daily    . carvedilol (COREG) 12.5 MG tablet take 1.5 tablets by mouth twice a day 90 tablet 0  . cetirizine (ZYRTEC) 10 MG tablet Take 10 mg by mouth daily as needed for allergies.     . Cholecalciferol (VITAMIN D3) 1000 UNITS CAPS Take 1 capsule by mouth daily.     . hydrochlorothiazide (HYDRODIURIL) 25 MG tablet take 1/2 tablet by mouth once daily 15 tablet 11  . losartan (COZAAR) 50 MG tablet Take 1.5 tablets (75 mg total) by mouth daily. NEED OFFICE VISIT WITH Davianna Deutschman 45 tablet 0  . simvastatin (ZOCOR) 40 MG tablet TAKE ONE TABLET BY MOUTH EVERY EVENING 30 tablet 0  . spironolactone (ALDACTONE) 25 MG  tablet take 1/2 tablet once daily 15 tablet 0  . vitamin C (ASCORBIC ACID) 500 MG tablet Take 500 mg by mouth daily.       No current facility-administered medications for this visit.    BP 126/66 mmHg  Pulse 64  Ht 5\' 4"  (1.626 m)  Wt 183 lb 12.8 oz (83.371 kg)  BMI 31.53 kg/m2 General: NAD Neck: No JVD, no thyromegaly or thyroid nodule.  Lungs: Clear to auscultation bilaterally with normal respiratory effort. CV: Nondisplaced PMI.  Heart regular S1/S2, no S3/S4, 1/6 SEM RUSB.  Trace ankle edema.  Right carotid bruit.  Normal pedal pulses.  Abdomen: Soft, nontender, no  hepatosplenomegaly, no distention.  Neurologic: Alert and oriented x 3.  Psych: Normal affect. Extremities: No clubbing or cyanosis.   Assessment/Plan:  CAD, AUTOLOGOUS BYPASS GRAFT No exertional chest pain. Continue ASA 81, statin, ARB, beta blocker.  Carotid stenosis  Due for carotid dopplers,will arrange.     HYPERLIPIDEMIA Check lipids today. SYSTOLIC HEART FAILURE, CHRONIC  Stable NYHA class II symptoms. He does not look volume overloaded. I reviewed echo from 3/16, EF improved to 45-50%.  Has Medtronic ICD.  - ICD is nearing ERI.  As his EF has improved to 45-50%, I do not think that generator needs to be replaced.  He wants to discuss with Dr Lovena Le whether or not to explant it, leaning towards not explanting.   - Continue current Coreg, losartan, spironolactone.    - BMET/BNP today.  Followup in 6 months but will need repeat BMET in 3 months with spironolactone use.    Loralie Champagne 09/09/2014

## 2014-09-10 ENCOUNTER — Other Ambulatory Visit: Payer: Self-pay | Admitting: Cardiology

## 2014-09-14 ENCOUNTER — Other Ambulatory Visit: Payer: Self-pay | Admitting: Cardiology

## 2014-09-14 DIAGNOSIS — I6523 Occlusion and stenosis of bilateral carotid arteries: Secondary | ICD-10-CM

## 2014-09-23 ENCOUNTER — Ambulatory Visit (HOSPITAL_COMMUNITY): Payer: Medicare Other

## 2014-09-23 ENCOUNTER — Ambulatory Visit (HOSPITAL_COMMUNITY): Payer: Medicare Other | Attending: Cardiology

## 2014-09-23 DIAGNOSIS — I2510 Atherosclerotic heart disease of native coronary artery without angina pectoris: Secondary | ICD-10-CM | POA: Diagnosis not present

## 2014-09-23 DIAGNOSIS — Z951 Presence of aortocoronary bypass graft: Secondary | ICD-10-CM | POA: Insufficient documentation

## 2014-09-23 DIAGNOSIS — Z87891 Personal history of nicotine dependence: Secondary | ICD-10-CM | POA: Diagnosis not present

## 2014-09-23 DIAGNOSIS — I6523 Occlusion and stenosis of bilateral carotid arteries: Secondary | ICD-10-CM | POA: Diagnosis not present

## 2014-09-23 DIAGNOSIS — I10 Essential (primary) hypertension: Secondary | ICD-10-CM | POA: Insufficient documentation

## 2014-09-23 DIAGNOSIS — E785 Hyperlipidemia, unspecified: Secondary | ICD-10-CM | POA: Diagnosis not present

## 2014-09-28 ENCOUNTER — Other Ambulatory Visit: Payer: Self-pay | Admitting: *Deleted

## 2014-09-28 DIAGNOSIS — E041 Nontoxic single thyroid nodule: Secondary | ICD-10-CM

## 2014-10-04 ENCOUNTER — Ambulatory Visit (HOSPITAL_COMMUNITY)
Admission: RE | Admit: 2014-10-04 | Discharge: 2014-10-04 | Disposition: A | Discharge status: Home / Self Care | Payer: Medicare Other | Source: Ambulatory Visit | Attending: Cardiology | Admitting: Cardiology

## 2014-10-04 ENCOUNTER — Other Ambulatory Visit (HOSPITAL_COMMUNITY): Payer: Medicare Other

## 2014-10-04 DIAGNOSIS — E041 Nontoxic single thyroid nodule: Secondary | ICD-10-CM | POA: Insufficient documentation

## 2014-10-04 DIAGNOSIS — E042 Nontoxic multinodular goiter: Secondary | ICD-10-CM | POA: Diagnosis not present

## 2014-10-05 ENCOUNTER — Other Ambulatory Visit: Payer: Self-pay | Admitting: *Deleted

## 2014-10-05 ENCOUNTER — Encounter: Payer: Self-pay | Admitting: *Deleted

## 2014-10-05 DIAGNOSIS — E041 Nontoxic single thyroid nodule: Secondary | ICD-10-CM

## 2014-10-12 ENCOUNTER — Other Ambulatory Visit (HOSPITAL_COMMUNITY): Payer: Self-pay | Admitting: Pulmonary Disease

## 2014-10-12 DIAGNOSIS — E041 Nontoxic single thyroid nodule: Secondary | ICD-10-CM

## 2014-10-20 ENCOUNTER — Other Ambulatory Visit (HOSPITAL_COMMUNITY): Payer: Medicare Other

## 2014-11-17 ENCOUNTER — Ambulatory Visit (HOSPITAL_COMMUNITY)
Admission: RE | Admit: 2014-11-17 | Discharge: 2014-11-17 | Disposition: A | Discharge status: Home / Self Care | Payer: Medicare Other | Source: Ambulatory Visit | Attending: Pulmonary Disease | Admitting: Pulmonary Disease

## 2014-11-17 DIAGNOSIS — E041 Nontoxic single thyroid nodule: Secondary | ICD-10-CM | POA: Diagnosis not present

## 2014-11-17 DIAGNOSIS — Z8639 Personal history of other endocrine, nutritional and metabolic disease: Secondary | ICD-10-CM | POA: Diagnosis not present

## 2014-11-17 MED ORDER — LIDOCAINE HCL (PF) 2 % IJ SOLN
INTRAMUSCULAR | Status: AC
  Administered 2014-11-17: 10 mL
  Filled 2014-11-17: qty 10

## 2014-11-17 MED ORDER — LIDOCAINE HCL (PF) 2 % IJ SOLN
10.0000 mL | Freq: Once | INTRAMUSCULAR | Status: AC
  Administered 2014-11-17: 10 mL

## 2014-11-17 MED ORDER — LIDOCAINE HCL (PF) 2 % IJ SOLN
INTRAMUSCULAR | Status: AC
  Filled 2014-11-17: qty 10

## 2014-11-17 NOTE — Discharge Instructions (Signed)
Thyroid Biopsy °The thyroid gland is a butterfly-shaped gland situated in the front of the neck. It produces hormones which affect metabolism, growth and development, and body temperature. A thyroid biopsy is a procedure in which small samples of tissue or fluid are removed from the thyroid gland or mass and examined under a microscope. This test is done to determine the cause of thyroid problems, such as infection, cancer, or other thyroid problems. °There are 2 ways to obtain samples: °1. Fine needle biopsy. Samples are removed using a thin needle inserted through the skin and into the thyroid gland or mass. °2. Open biopsy. Samples are removed after a cut (incision) is made through the skin. °LET YOUR CAREGIVER KNOW ABOUT:  °· Allergies. °· Medications taken including herbs, eye drops, over-the-counter medications, and creams. °· Use of steroids (by mouth or creams). °· Previous problems with anesthetics or numbing medicine. °· Possibility of pregnancy, if this applies. °· History of blood clots (thrombophlebitis). °· History of bleeding or blood problems. °· Previous surgery. °· Other health problems. °RISKS AND COMPLICATIONS °· Bleeding from the site. The risk of bleeding is higher if you have a bleeding disorder or are taking any blood thinning medications (anticoagulants). °· Infection. °· Injury to structures near the thyroid gland. °BEFORE THE PROCEDURE  °This is a procedure that can be done as an outpatient. Confirm the time that you need to arrive for your procedure. Confirm whether there is a need to fast or withhold any medications. A blood sample may be done to determine your blood clotting time. Medicine may be given to help you relax (sedative). °PROCEDURE °Fine needle biopsy. °You will be awake during the procedure. You may be asked to lie on your back with your head tipped backward to extend your neck. Let your caregiver know if you cannot tolerate the positioning. An area on your neck will be  cleansed. A needle is inserted through the skin of your neck. You may feel a mild discomfort during this procedure. You may be asked to avoid coughing, talking, swallowing, or making sounds during some portions of the procedure. The needle is withdrawn once tissue or fluid samples have been removed. Pressure may be applied to the neck to reduce swelling and ensure that bleeding has stopped. The samples will be sent for examination.  °Open biopsy. °You will be given general anesthesia. You will be asleep during the procedure. An incision is made in your neck. A sample of thyroid tissue or the mass is removed. The tissue sample or mass will be sent for examination. The sample or mass may be examined during the biopsy. If the sample or mass contains cancer cells, some or all of the thyroid gland may be removed. The incision is closed with stitches. °AFTER THE PROCEDURE  °Your recovery will be assessed and monitored. If there are no problems, as an outpatient, you should be able to go home shortly after the procedure. °If you had a fine needle biopsy: °· You may have soreness at the biopsy site for 1 to 2 days. °If you had an open biopsy:  °· You may have soreness at the biopsy site for 3 to 4 days. °· You may have a hoarse voice or sore throat for 1 to 2 days. °Obtaining the Test Results °It is your responsibility to obtain your test results. Do not assume everything is normal if you have not heard from your caregiver or the medical facility. It is important for you to follow up   on all of your test results. °HOME CARE INSTRUCTIONS  °· Keeping your head raised on a pillow when you are lying down may ease biopsy site discomfort. °· Supporting the back of your head and neck with both hands as you sit up from a lying position may ease biopsy site discomfort. °· Only take over-the-counter or prescription medicines for pain, discomfort, or fever as directed by your caregiver. °· Throat lozenges or gargling with warm salt  water may help to soothe a sore throat. °SEEK IMMEDIATE MEDICAL CARE IF:  °· You have severe bleeding from the biopsy site. °· You have difficulty swallowing. °· You have a fever. °· You have increased pain, swelling, redness, or warmth at the biopsy site. °· You notice pus coming from the biopsy site. °· You have swollen glands (lymph nodes) in your neck. °Document Released: 02/25/2007 Document Revised: 08/25/2012 Document Reviewed: 07/23/2013 °ExitCare® Patient Information ©2015 [employer]. This information is not intended to replace advice given to you by your health care provider. Make sure you discuss any questions you have with your health care provider. ° °

## 2014-11-17 NOTE — Procedures (Signed)
PreOperative Dx: RIGHT thyroid nodule Postoperative Dx: RIGHT thyroid nodule Procedure:   US guided FNA of RIGHT thyroid nodule Radiologist:  Thornton Papas Anesthesia:  2 ml of 2% lidocaine Specimen:  FNA x 3  EBL:   < 1 ml Complications: None

## 2014-11-29 DIAGNOSIS — M1990 Unspecified osteoarthritis, unspecified site: Secondary | ICD-10-CM | POA: Diagnosis not present

## 2014-11-29 DIAGNOSIS — I25110 Atherosclerotic heart disease of native coronary artery with unstable angina pectoris: Secondary | ICD-10-CM | POA: Diagnosis not present

## 2014-11-29 DIAGNOSIS — I10 Essential (primary) hypertension: Secondary | ICD-10-CM | POA: Diagnosis not present

## 2014-11-29 DIAGNOSIS — I129 Hypertensive chronic kidney disease with stage 1 through stage 4 chronic kidney disease, or unspecified chronic kidney disease: Secondary | ICD-10-CM | POA: Diagnosis not present

## 2014-12-09 ENCOUNTER — Other Ambulatory Visit (INDEPENDENT_AMBULATORY_CARE_PROVIDER_SITE_OTHER): Payer: Medicare Other | Admitting: *Deleted

## 2014-12-09 ENCOUNTER — Other Ambulatory Visit: Payer: Self-pay

## 2014-12-09 DIAGNOSIS — I6523 Occlusion and stenosis of bilateral carotid arteries: Secondary | ICD-10-CM

## 2014-12-09 DIAGNOSIS — E875 Hyperkalemia: Secondary | ICD-10-CM

## 2014-12-09 DIAGNOSIS — I2510 Atherosclerotic heart disease of native coronary artery without angina pectoris: Secondary | ICD-10-CM | POA: Diagnosis not present

## 2014-12-09 DIAGNOSIS — R0602 Shortness of breath: Secondary | ICD-10-CM

## 2014-12-09 LAB — BASIC METABOLIC PANEL
BUN: 35 mg/dL — AB (ref 6–23)
CALCIUM: 9.5 mg/dL (ref 8.4–10.5)
CHLORIDE: 104 meq/L (ref 96–112)
CO2: 29 meq/L (ref 19–32)
Creatinine, Ser: 1.47 mg/dL (ref 0.40–1.50)
GFR: 48.27 mL/min — ABNORMAL LOW (ref >60.00)
GLUCOSE: 110 mg/dL — AB (ref 70–99)
POTASSIUM: 5.4 meq/L — AB (ref 3.5–5.1)
SODIUM: 137 meq/L (ref 135–145)

## 2014-12-09 NOTE — Addendum Note (Signed)
Addended by: Eulis Foster on: 12/09/2014 08:14 AM   Modules accepted: Orders

## 2014-12-10 ENCOUNTER — Other Ambulatory Visit: Payer: Self-pay | Admitting: *Deleted

## 2014-12-10 ENCOUNTER — Other Ambulatory Visit (INDEPENDENT_AMBULATORY_CARE_PROVIDER_SITE_OTHER): Payer: Medicare Other | Admitting: *Deleted

## 2014-12-10 DIAGNOSIS — E875 Hyperkalemia: Secondary | ICD-10-CM

## 2014-12-10 LAB — POTASSIUM: Potassium: 5.2 mEq/L — ABNORMAL HIGH (ref 3.5–5.1)

## 2014-12-21 ENCOUNTER — Other Ambulatory Visit (INDEPENDENT_AMBULATORY_CARE_PROVIDER_SITE_OTHER): Payer: Medicare Other | Admitting: *Deleted

## 2014-12-21 DIAGNOSIS — E875 Hyperkalemia: Secondary | ICD-10-CM

## 2014-12-21 LAB — BASIC METABOLIC PANEL
BUN: 43 mg/dL — ABNORMAL HIGH (ref 6–23)
CO2: 26 mEq/L (ref 19–32)
CREATININE: 1.45 mg/dL (ref 0.40–1.50)
Calcium: 9.6 mg/dL (ref 8.4–10.5)
Chloride: 105 mEq/L (ref 96–112)
GFR: 49.03 mL/min — AB (ref >60.00)
Glucose, Bld: 130 mg/dL — ABNORMAL HIGH (ref 70–99)
POTASSIUM: 4.9 meq/L (ref 3.5–5.1)
SODIUM: 137 meq/L (ref 135–145)

## 2014-12-22 ENCOUNTER — Telehealth: Payer: Self-pay

## 2014-12-22 NOTE — Telephone Encounter (Signed)
-----   Message from Larey Dresser, MD sent at 12/21/2014 11:34 PM EDT ----- stable

## 2014-12-22 NOTE — Telephone Encounter (Signed)
Provided pt with his lab results, pt requested K+ dietary information.  Mailed information requested.

## 2015-01-29 ENCOUNTER — Other Ambulatory Visit: Payer: Self-pay | Admitting: Cardiology

## 2015-02-01 DIAGNOSIS — C44319 Basal cell carcinoma of skin of other parts of face: Secondary | ICD-10-CM | POA: Diagnosis not present

## 2015-02-01 DIAGNOSIS — L821 Other seborrheic keratosis: Secondary | ICD-10-CM | POA: Diagnosis not present

## 2015-02-01 DIAGNOSIS — D485 Neoplasm of uncertain behavior of skin: Secondary | ICD-10-CM | POA: Diagnosis not present

## 2015-02-01 DIAGNOSIS — L570 Actinic keratosis: Secondary | ICD-10-CM | POA: Diagnosis not present

## 2015-02-10 DIAGNOSIS — C44310 Basal cell carcinoma of skin of unspecified parts of face: Secondary | ICD-10-CM | POA: Diagnosis not present

## 2015-02-28 ENCOUNTER — Other Ambulatory Visit: Payer: Self-pay | Admitting: Cardiology

## 2015-02-28 DIAGNOSIS — Z23 Encounter for immunization: Secondary | ICD-10-CM | POA: Diagnosis not present

## 2015-03-30 DIAGNOSIS — I110 Hypertensive heart disease with heart failure: Secondary | ICD-10-CM | POA: Diagnosis not present

## 2015-03-30 DIAGNOSIS — H6123 Impacted cerumen, bilateral: Secondary | ICD-10-CM | POA: Diagnosis not present

## 2015-03-30 DIAGNOSIS — I25110 Atherosclerotic heart disease of native coronary artery with unstable angina pectoris: Secondary | ICD-10-CM | POA: Diagnosis not present

## 2015-03-30 DIAGNOSIS — E785 Hyperlipidemia, unspecified: Secondary | ICD-10-CM | POA: Diagnosis not present

## 2015-03-30 DIAGNOSIS — I129 Hypertensive chronic kidney disease with stage 1 through stage 4 chronic kidney disease, or unspecified chronic kidney disease: Secondary | ICD-10-CM | POA: Diagnosis not present

## 2015-04-11 DIAGNOSIS — M810 Age-related osteoporosis without current pathological fracture: Secondary | ICD-10-CM | POA: Diagnosis not present

## 2015-04-11 DIAGNOSIS — I110 Hypertensive heart disease with heart failure: Secondary | ICD-10-CM | POA: Diagnosis not present

## 2015-04-11 DIAGNOSIS — I25110 Atherosclerotic heart disease of native coronary artery with unstable angina pectoris: Secondary | ICD-10-CM | POA: Diagnosis not present

## 2015-04-11 DIAGNOSIS — E785 Hyperlipidemia, unspecified: Secondary | ICD-10-CM | POA: Diagnosis not present

## 2015-04-11 DIAGNOSIS — I509 Heart failure, unspecified: Secondary | ICD-10-CM | POA: Diagnosis not present

## 2015-04-11 DIAGNOSIS — M1990 Unspecified osteoarthritis, unspecified site: Secondary | ICD-10-CM | POA: Diagnosis not present

## 2015-04-11 DIAGNOSIS — I129 Hypertensive chronic kidney disease with stage 1 through stage 4 chronic kidney disease, or unspecified chronic kidney disease: Secondary | ICD-10-CM | POA: Diagnosis not present

## 2015-04-18 DIAGNOSIS — M9905 Segmental and somatic dysfunction of pelvic region: Secondary | ICD-10-CM | POA: Diagnosis not present

## 2015-04-18 DIAGNOSIS — M5442 Lumbago with sciatica, left side: Secondary | ICD-10-CM | POA: Diagnosis not present

## 2015-04-18 DIAGNOSIS — M9903 Segmental and somatic dysfunction of lumbar region: Secondary | ICD-10-CM | POA: Diagnosis not present

## 2015-04-18 DIAGNOSIS — M9902 Segmental and somatic dysfunction of thoracic region: Secondary | ICD-10-CM | POA: Diagnosis not present

## 2015-04-20 DIAGNOSIS — M9905 Segmental and somatic dysfunction of pelvic region: Secondary | ICD-10-CM | POA: Diagnosis not present

## 2015-04-20 DIAGNOSIS — M5442 Lumbago with sciatica, left side: Secondary | ICD-10-CM | POA: Diagnosis not present

## 2015-04-20 DIAGNOSIS — M9902 Segmental and somatic dysfunction of thoracic region: Secondary | ICD-10-CM | POA: Diagnosis not present

## 2015-04-20 DIAGNOSIS — M9903 Segmental and somatic dysfunction of lumbar region: Secondary | ICD-10-CM | POA: Diagnosis not present

## 2015-04-22 DIAGNOSIS — M9905 Segmental and somatic dysfunction of pelvic region: Secondary | ICD-10-CM | POA: Diagnosis not present

## 2015-04-22 DIAGNOSIS — M9902 Segmental and somatic dysfunction of thoracic region: Secondary | ICD-10-CM | POA: Diagnosis not present

## 2015-04-22 DIAGNOSIS — M9903 Segmental and somatic dysfunction of lumbar region: Secondary | ICD-10-CM | POA: Diagnosis not present

## 2015-04-22 DIAGNOSIS — M5442 Lumbago with sciatica, left side: Secondary | ICD-10-CM | POA: Diagnosis not present

## 2015-04-25 DIAGNOSIS — M5442 Lumbago with sciatica, left side: Secondary | ICD-10-CM | POA: Diagnosis not present

## 2015-04-25 DIAGNOSIS — M9903 Segmental and somatic dysfunction of lumbar region: Secondary | ICD-10-CM | POA: Diagnosis not present

## 2015-04-25 DIAGNOSIS — M9902 Segmental and somatic dysfunction of thoracic region: Secondary | ICD-10-CM | POA: Diagnosis not present

## 2015-04-25 DIAGNOSIS — M9905 Segmental and somatic dysfunction of pelvic region: Secondary | ICD-10-CM | POA: Diagnosis not present

## 2015-04-27 DIAGNOSIS — M9902 Segmental and somatic dysfunction of thoracic region: Secondary | ICD-10-CM | POA: Diagnosis not present

## 2015-04-27 DIAGNOSIS — M9903 Segmental and somatic dysfunction of lumbar region: Secondary | ICD-10-CM | POA: Diagnosis not present

## 2015-04-27 DIAGNOSIS — M9905 Segmental and somatic dysfunction of pelvic region: Secondary | ICD-10-CM | POA: Diagnosis not present

## 2015-04-27 DIAGNOSIS — M5442 Lumbago with sciatica, left side: Secondary | ICD-10-CM | POA: Diagnosis not present

## 2015-04-29 DIAGNOSIS — M9905 Segmental and somatic dysfunction of pelvic region: Secondary | ICD-10-CM | POA: Diagnosis not present

## 2015-04-29 DIAGNOSIS — M9903 Segmental and somatic dysfunction of lumbar region: Secondary | ICD-10-CM | POA: Diagnosis not present

## 2015-04-29 DIAGNOSIS — M9902 Segmental and somatic dysfunction of thoracic region: Secondary | ICD-10-CM | POA: Diagnosis not present

## 2015-04-29 DIAGNOSIS — M5442 Lumbago with sciatica, left side: Secondary | ICD-10-CM | POA: Diagnosis not present

## 2015-05-02 ENCOUNTER — Ambulatory Visit (INDEPENDENT_AMBULATORY_CARE_PROVIDER_SITE_OTHER): Payer: Medicare Other | Admitting: Cardiology

## 2015-05-02 ENCOUNTER — Encounter: Payer: Self-pay | Admitting: Cardiology

## 2015-05-02 VITALS — BP 134/68 | HR 61 | Ht 64.0 in | Wt 188.4 lb

## 2015-05-02 DIAGNOSIS — I2510 Atherosclerotic heart disease of native coronary artery without angina pectoris: Secondary | ICD-10-CM

## 2015-05-02 DIAGNOSIS — I6523 Occlusion and stenosis of bilateral carotid arteries: Secondary | ICD-10-CM | POA: Diagnosis not present

## 2015-05-02 DIAGNOSIS — M5442 Lumbago with sciatica, left side: Secondary | ICD-10-CM | POA: Diagnosis not present

## 2015-05-02 DIAGNOSIS — M9902 Segmental and somatic dysfunction of thoracic region: Secondary | ICD-10-CM | POA: Diagnosis not present

## 2015-05-02 DIAGNOSIS — M9903 Segmental and somatic dysfunction of lumbar region: Secondary | ICD-10-CM | POA: Diagnosis not present

## 2015-05-02 DIAGNOSIS — I5022 Chronic systolic (congestive) heart failure: Secondary | ICD-10-CM

## 2015-05-02 DIAGNOSIS — M9905 Segmental and somatic dysfunction of pelvic region: Secondary | ICD-10-CM | POA: Diagnosis not present

## 2015-05-02 MED ORDER — CYCLOBENZAPRINE HCL 5 MG PO TABS
5.0000 mg | ORAL_TABLET | Freq: Two times a day (BID) | ORAL | Status: DC | PRN
Start: 2015-05-02 — End: 2015-09-30

## 2015-05-02 NOTE — Patient Instructions (Addendum)
.  Medication Instructions:  Use cyclobenzaprine for muscle spasm instead of ibuprofen. You can take 5mg  two times a day as needed. If this helps and you feel you need a refill Dr Aundra Dubin recommends you contact your primary care doctor.  Labwork: None   Testing/Procedures: Your physician has requested that you have a carotid duplex. This test is an ultrasound of the carotid arteries in your neck. It looks at blood flow through these arteries that supply the brain with blood. Allow one hour for this exam. There are no restrictions or special instructions. YOU CAN SCHEDULE THIS TO BE DONE AT [facility]   Follow-Up:  Your physician wants you to follow-up in: 6 months with Dr Domenic Polite in [place]. (June 2017). You will receive a reminder letter in the mail two months in advance. If you don't receive a letter, please call our office to schedule the follow-up appointment.   Any Other Special Instructions Will Be Listed Below (If Applicable).     If you need a refill on your cardiac medications before your next appointment, please call your pharmacy.

## 2015-05-03 NOTE — Progress Notes (Signed)
Patient ID: [name], male   DOB: [birth year], 81 y.o.   MRN: [id number] PCP: Dr. Luan Pulling  81 yo with history of CAD s/p CABG and ischemic cardiomyopathy presents for cardiology followup.  He has been doing well since I last saw him.  He does not get short of breath when he walks typically. He can walk up to a mile. He does get mild shortness of breath with more heavy exertion such as walking up hills or gardening.  Mild dyspnea with a flight of steps.  He is more limited by his right hip pain and back pain with sciatica. No chest pain. No orthopnea/PND.    His ICD is nearing ERI.  Repeat echo in 3/16 showed EF improved to 45-50%.    ECG: NSR, LAFB, RBBB (stable)  Labs (10/11): K 4.2, creatinine 1.3, BNP 62  Labs (2/12): K 4.7, creatinine 1.2, BNP 49, LDL 66, HDL 35 Labs (10/12): K 4.7, creatinine 1.3, LDL 71, HDl 31 Labs (3/13): LDL 60, HDL 39 Labs (7/13): K 5.1=>4.5, creatinine 1.4 Labs (11/13): K 4.9, creatinine 1.3, LDL 60, HDL 31, BNP 46 Labs (3/14): K 4.9, creatinine 1.5, LDL 53, HDL 31 Labs (7/14): K 5.0, creatinine 1.4 Labs (11/14): K 4.8, creatinine 1.3, BNP 87 Labs (3/15): K 4.4, creatinine 1.43 Labs (9/15): K 4.4, creatinine 1.2 Labs (11/16): K 5.1, creatinine 1.25, HCT 36.7, WBC 3.3, plts 108, LDL 56, HDL 33  Allergies (verified):  1) ! Codeine   Past Medical History:  1. CAD: s/p CABG 8/06 with LIMA-LAD, SVG-CFX, SVG-distal RCA.  2. NEPHROLITHIASIS  3. SYSTOLIC HEART FAILURE: Ischemic cardiomyopathy with last echo (2011) showing EF 35-40%, mild LV hypertrophy, inferoposterior akinesis, grade I diastolic dysfunction, biatrial enlargement, mild mitral regurgitation. Patient has Medtronic ICD.  Echo (3/16) with EF 45-50%, basal inferior akinesis, mild MR, RV mildly dilated with normal systolic function.  4. HYPERTENSION 5. PROSTATE CANCER: s/p cryosurgical ablation.  6. HYPERLIPIDEMIA 7. Abnormal chest x-ray with questionable nodule.  8. Medtronic ICD  9. Carotid  stenosis: Dopplers 3/13 with 40-59% RICA stenosis. Dopplers (3/14) with 40-59% bilateral ICA stenosis. Carotid dopplers (3/15) with 40-59% bilateral ICA stenosis. Carotid dopplers (3/15) with 40-59% bilateral ICA stenosis. Carotid dopplers (5/16) with 40-59% BICA stenosis.  10. Multinodular goiter: noted on thyroid US. Thyroid nodule biopsy in 7/16 was benign.  11. CKD 12. Gout  Family History:  CAD   Social History:  He lives with his wife in [place]. 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   Current Outpatient Prescriptions  Medication Sig Dispense Refill  . acetaminophen (TYLENOL) 500 MG tablet Take 500 mg by mouth every 6 (six) hours as needed for mild pain.     Marland Kitchen allopurinol (ZYLOPRIM) 300 MG tablet Take 300 mg by mouth daily.     Marland Kitchen aspirin EC 81 MG tablet Take 162 mg by mouth daily. Take two tablets daily--this will be 162mg  daily    . carvedilol (COREG) 12.5 MG tablet TAKE 1 AND 1/2 TABLET TWICE DAILY. 90 tablet 5  . cetirizine (ZYRTEC) 10 MG tablet Take 10 mg by mouth daily as needed for allergies.     . Cholecalciferol (VITAMIN D3) 1000 UNITS CAPS Take 1 capsule by mouth daily.     . hydrochlorothiazide (HYDRODIURIL) 25 MG tablet take 1/2 tablet by mouth once daily 15 tablet 3  . losartan (COZAAR) 50 MG tablet TAKE 1 AND 1/2 TABLETS ONCE  DAILY. 45 tablet 5  . simvastatin (ZOCOR) 40 MG tablet TAKE ONE TABLET BY MOUTH EVERY EVENING 30 tablet 0  . spironolactone (ALDACTONE) 25 MG tablet take 1/2 tablet once daily 15 tablet 5  . vitamin C (ASCORBIC ACID) 500 MG tablet Take 500 mg by mouth daily.      . cyclobenzaprine (FLEXERIL) 5 MG tablet Take 1 tablet (5 mg total) by mouth 2 (two) times daily as needed for muscle spasms. 30 tablet 0   No current facility-administered medications for this visit.    BP 134/68 mmHg  Pulse 61  Ht 5\' 4"  (1.626 m)  Wt 188 lb 6.4 oz (85.458 kg)  BMI 32.32 kg/m2 General:  NAD Neck: No JVD, no thyromegaly or thyroid nodule.  Lungs: Clear to auscultation bilaterally with normal respiratory effort. CV: Nondisplaced PMI.  Heart regular S1/S2, no S3/S4, 1/6 SEM RUSB.  Trace ankle edema.  Right carotid bruit.  Normal pedal pulses.  Abdomen: Soft, nontender, no hepatosplenomegaly, no distention.  Neurologic: Alert and oriented x 3.  Psych: Normal affect. Extremities: No clubbing or cyanosis.   Assessment/Plan:  CAD H/o CABG.  No exertional chest pain. Continue ASA 81, statin, ARB, beta blocker.  Carotid stenosis  Due for carotid dopplers in 5/17, will arrange (will get these done in [place]).      HYPERLIPIDEMIA Recent lipids showed excellent LDL. SYSTOLIC HEART FAILURE, CHRONIC  Ischemic cardiomyopathy.  Stable NYHA class II symptoms. He does not look volume overloaded. Echo from 3/16 showed EF improved to 45-50%.  Has Medtronic ICD.  - ICD is nearing ERI.  As his EF has improved to 45-50%, I do not think that generator needs to be replaced.  He wants to discuss with Dr Lovena Le whether or not to explant it, leaning towards not explanting.   - Continue current Coreg, losartan, spironolactone.    Low back pain Tylenol not helping much.  I gave him a prescription for low dose cyclobenzaprine.  Pancytopenia Most recent CBC from 11/16 shows mild leukopenia, mild anemia, and mild thrombocytopenia.  I do not have any recent priors to compare.  I asked him to talk to his PCP about this, I cannot tell if chronic or new.   He has a hard time getting in to [place].  I will have him followup with Dr Domenic Polite in 6 months in at our [place] office.    Loralie Champagne 05/03/2015

## 2015-05-04 DIAGNOSIS — M9902 Segmental and somatic dysfunction of thoracic region: Secondary | ICD-10-CM | POA: Diagnosis not present

## 2015-05-04 DIAGNOSIS — M5442 Lumbago with sciatica, left side: Secondary | ICD-10-CM | POA: Diagnosis not present

## 2015-05-04 DIAGNOSIS — M9905 Segmental and somatic dysfunction of pelvic region: Secondary | ICD-10-CM | POA: Diagnosis not present

## 2015-05-04 DIAGNOSIS — M9903 Segmental and somatic dysfunction of lumbar region: Secondary | ICD-10-CM | POA: Diagnosis not present

## 2015-05-06 DIAGNOSIS — M5442 Lumbago with sciatica, left side: Secondary | ICD-10-CM | POA: Diagnosis not present

## 2015-05-06 DIAGNOSIS — M9905 Segmental and somatic dysfunction of pelvic region: Secondary | ICD-10-CM | POA: Diagnosis not present

## 2015-05-06 DIAGNOSIS — M9903 Segmental and somatic dysfunction of lumbar region: Secondary | ICD-10-CM | POA: Diagnosis not present

## 2015-05-06 DIAGNOSIS — M9902 Segmental and somatic dysfunction of thoracic region: Secondary | ICD-10-CM | POA: Diagnosis not present

## 2015-05-11 DIAGNOSIS — M5442 Lumbago with sciatica, left side: Secondary | ICD-10-CM | POA: Diagnosis not present

## 2015-05-11 DIAGNOSIS — M9905 Segmental and somatic dysfunction of pelvic region: Secondary | ICD-10-CM | POA: Diagnosis not present

## 2015-05-11 DIAGNOSIS — M9903 Segmental and somatic dysfunction of lumbar region: Secondary | ICD-10-CM | POA: Diagnosis not present

## 2015-05-11 DIAGNOSIS — M9902 Segmental and somatic dysfunction of thoracic region: Secondary | ICD-10-CM | POA: Diagnosis not present

## 2015-05-13 DIAGNOSIS — M9903 Segmental and somatic dysfunction of lumbar region: Secondary | ICD-10-CM | POA: Diagnosis not present

## 2015-05-13 DIAGNOSIS — M9905 Segmental and somatic dysfunction of pelvic region: Secondary | ICD-10-CM | POA: Diagnosis not present

## 2015-05-13 DIAGNOSIS — M5442 Lumbago with sciatica, left side: Secondary | ICD-10-CM | POA: Diagnosis not present

## 2015-05-13 DIAGNOSIS — M9902 Segmental and somatic dysfunction of thoracic region: Secondary | ICD-10-CM | POA: Diagnosis not present

## 2015-05-20 DIAGNOSIS — M9905 Segmental and somatic dysfunction of pelvic region: Secondary | ICD-10-CM | POA: Diagnosis not present

## 2015-05-20 DIAGNOSIS — M9903 Segmental and somatic dysfunction of lumbar region: Secondary | ICD-10-CM | POA: Diagnosis not present

## 2015-05-20 DIAGNOSIS — M9902 Segmental and somatic dysfunction of thoracic region: Secondary | ICD-10-CM | POA: Diagnosis not present

## 2015-05-20 DIAGNOSIS — M5442 Lumbago with sciatica, left side: Secondary | ICD-10-CM | POA: Diagnosis not present

## 2015-05-29 ENCOUNTER — Other Ambulatory Visit: Payer: Self-pay | Admitting: Cardiology

## 2015-07-14 ENCOUNTER — Emergency Department (HOSPITAL_COMMUNITY)
Admission: EM | Admit: 2015-07-14 | Discharge: 2015-07-14 | Disposition: A | Discharge status: Home / Self Care | Payer: Medicare Other | Attending: Emergency Medicine | Admitting: Emergency Medicine

## 2015-07-14 ENCOUNTER — Encounter (HOSPITAL_COMMUNITY): Payer: Self-pay | Admitting: *Deleted

## 2015-07-14 ENCOUNTER — Emergency Department (HOSPITAL_COMMUNITY): Payer: Medicare Other

## 2015-07-14 DIAGNOSIS — I10 Essential (primary) hypertension: Secondary | ICD-10-CM | POA: Insufficient documentation

## 2015-07-14 DIAGNOSIS — R1031 Right lower quadrant pain: Secondary | ICD-10-CM | POA: Diagnosis present

## 2015-07-14 DIAGNOSIS — Z8546 Personal history of malignant neoplasm of prostate: Secondary | ICD-10-CM | POA: Diagnosis not present

## 2015-07-14 DIAGNOSIS — Z95810 Presence of automatic (implantable) cardiac defibrillator: Secondary | ICD-10-CM | POA: Insufficient documentation

## 2015-07-14 DIAGNOSIS — I2510 Atherosclerotic heart disease of native coronary artery without angina pectoris: Secondary | ICD-10-CM | POA: Insufficient documentation

## 2015-07-14 DIAGNOSIS — Z87442 Personal history of urinary calculi: Secondary | ICD-10-CM | POA: Insufficient documentation

## 2015-07-14 DIAGNOSIS — R05 Cough: Secondary | ICD-10-CM | POA: Insufficient documentation

## 2015-07-14 DIAGNOSIS — I5020 Unspecified systolic (congestive) heart failure: Secondary | ICD-10-CM | POA: Diagnosis not present

## 2015-07-14 DIAGNOSIS — Z7982 Long term (current) use of aspirin: Secondary | ICD-10-CM | POA: Diagnosis not present

## 2015-07-14 DIAGNOSIS — Z79899 Other long term (current) drug therapy: Secondary | ICD-10-CM | POA: Insufficient documentation

## 2015-07-14 DIAGNOSIS — N200 Calculus of kidney: Secondary | ICD-10-CM | POA: Diagnosis not present

## 2015-07-14 DIAGNOSIS — R059 Cough, unspecified: Secondary | ICD-10-CM

## 2015-07-14 DIAGNOSIS — E785 Hyperlipidemia, unspecified: Secondary | ICD-10-CM | POA: Diagnosis not present

## 2015-07-14 DIAGNOSIS — Z951 Presence of aortocoronary bypass graft: Secondary | ICD-10-CM | POA: Diagnosis not present

## 2015-07-14 DIAGNOSIS — Z87891 Personal history of nicotine dependence: Secondary | ICD-10-CM | POA: Diagnosis not present

## 2015-07-14 LAB — URINALYSIS, ROUTINE W REFLEX MICROSCOPIC
Bilirubin Urine: NEGATIVE
GLUCOSE, UA: NEGATIVE mg/dL
HGB URINE DIPSTICK: NEGATIVE
Ketones, ur: NEGATIVE mg/dL
Leukocytes, UA: NEGATIVE
Nitrite: NEGATIVE
PH: 5.5 (ref 5.0–8.0)
Protein, ur: 30 mg/dL — AB
SPECIFIC GRAVITY, URINE: 1.025 (ref 1.005–1.030)

## 2015-07-14 LAB — CBC WITH DIFFERENTIAL/PLATELET
Basophils Absolute: 0 10*3/uL (ref 0.0–0.1)
Basophils Relative: 0 %
Eosinophils Absolute: 0 10*3/uL (ref 0.0–0.7)
Eosinophils Relative: 0 %
HEMATOCRIT: 33.6 % — AB (ref 39.0–52.0)
Hemoglobin: 11.1 g/dL — ABNORMAL LOW (ref 13.0–17.0)
LYMPHS ABS: 1.7 10*3/uL (ref 0.7–4.0)
Lymphocytes Relative: 23 %
MCH: 32.4 pg (ref 26.0–34.0)
MCHC: 33 g/dL (ref 30.0–36.0)
MCV: 98 fL (ref 78.0–100.0)
MONO ABS: 2 10*3/uL — AB (ref 0.1–1.0)
MONOS PCT: 28 %
Neutro Abs: 3.5 10*3/uL (ref 1.7–7.7)
Neutrophils Relative %: 49 %
PLATELETS: 86 10*3/uL — AB (ref 150–400)
RBC: 3.43 MIL/uL — ABNORMAL LOW (ref 4.22–5.81)
RDW: 12.7 % (ref 11.5–15.5)
Smear Review: DECREASED
WBC: 7.2 10*3/uL (ref 4.0–10.5)

## 2015-07-14 LAB — COMPREHENSIVE METABOLIC PANEL
ALT: 18 U/L (ref 17–63)
AST: 19 U/L (ref 15–41)
Albumin: 3.9 g/dL (ref 3.5–5.0)
Alkaline Phosphatase: 48 U/L (ref 38–126)
Anion gap: 6 (ref 5–15)
BUN: 44 mg/dL — ABNORMAL HIGH (ref 6–20)
CHLORIDE: 101 mmol/L (ref 101–111)
CO2: 26 mmol/L (ref 22–32)
CREATININE: 1.55 mg/dL — AB (ref 0.61–1.24)
Calcium: 8.5 mg/dL — ABNORMAL LOW (ref 8.9–10.3)
GFR calc Af Amer: 45 mL/min — ABNORMAL LOW (ref >60)
GFR, EST NON AFRICAN AMERICAN: 39 mL/min — AB (ref >60)
GLUCOSE: 221 mg/dL — AB (ref 65–99)
Potassium: 4.6 mmol/L (ref 3.5–5.1)
Sodium: 133 mmol/L — ABNORMAL LOW (ref 135–145)
Total Bilirubin: 0.7 mg/dL (ref 0.3–1.2)
Total Protein: 7 g/dL (ref 6.5–8.1)

## 2015-07-14 LAB — URINE MICROSCOPIC-ADD ON: RBC / HPF: NONE SEEN RBC/hpf (ref 0–5)

## 2015-07-14 LAB — BRAIN NATRIURETIC PEPTIDE: B NATRIURETIC PEPTIDE 5: 205 pg/mL — AB (ref 0.0–100.0)

## 2015-07-14 MED ORDER — FENTANYL CITRATE (PF) 100 MCG/2ML IJ SOLN
12.5000 ug | Freq: Once | INTRAMUSCULAR | Status: AC
  Administered 2015-07-14: 12.5 ug via INTRAVENOUS
  Filled 2015-07-14: qty 2

## 2015-07-14 MED ORDER — GUAIFENESIN 100 MG/5ML PO SYRP: 200.0000 mg | ORAL_SOLUTION | Freq: Three times a day (TID) | ORAL | Status: DC | PRN

## 2015-07-14 MED ORDER — ACETAMINOPHEN 325 MG PO TABS
650.0000 mg | ORAL_TABLET | Freq: Once | ORAL | Status: AC
  Administered 2015-07-14: 650 mg via ORAL
  Filled 2015-07-14: qty 2

## 2015-07-14 MED ORDER — ACETAMINOPHEN 500 MG PO TABS: 500.0000 mg | ORAL_TABLET | Freq: Four times a day (QID) | ORAL | Status: DC | PRN

## 2015-07-14 NOTE — Discharge Instructions (Signed)
As discussed, tonight's evaluation has been largely reassuring. But, with some suspicion for early infection, it is important to monitor your condition carefully, and do not hesitate to return here if you develop new, or concerning changes in your condition. Regards, please be sure to follow-up with her primary care physician as well.

## 2015-07-14 NOTE — ED Notes (Signed)
Pt reporting pain in right groin beginning yesterday.  Pt also has URI symptoms, but primary concern in groin pain. Reporting pain makes ambulation difficult.

## 2015-07-14 NOTE — ED Provider Notes (Signed)
CSN: [id number]     Arrival date & time 07/14/15  1841 History  By signing my name below, I, Rowan Blase, attest that this documentation has been prepared under the direction and in the presence of Carmin Muskrat, MD . Electronically Signed: Rowan Blase, Scribe. 07/14/2015. 8:35 PM.   Chief Complaint  Patient presents with  . Groin Pain   The history is provided by the patient. No language interpreter was used.   HPI Comments:  [name] is a 82 y.o. male with PMHx of CAD, systolic CHF, HTN, and HLD who presents to the Emergency Department complaining of mild, intermittent right groin pain with walking onset yesterday. Pt reports associated persistent cough for the past three days, worsening in the last day. He notes pain is different than past kidney stones. Pt denies pain in left groin, scrotal pain, penile discharge, penile drainage, abdominal pain, vomiting, fever, dysuria, or hematuria.  Past Medical History  Diagnosis Date  . Coronary artery disease     s/p CABG 8/06 LIMA-LAD, SVG-CFX, SVG-distal RCA  . Nephrolithiasis     hx of  . Systolic CHF ([place])     systolic  . Hypertension   . Prostate cancer ([place])   . Hyperlipidemia     mixed  . ICD (implantable cardiac defibrillator) in place   . BPH (benign prostatic hypertrophy)    Past Surgical History  Procedure Laterality Date  . Prostate surgery    . Coronary artery bypass graft      8/06 with left anterior descending artery, saphenous vein graft to circumflex, saphenous vein graft to distal right coronary artery  . Colonoscopy    . Polypectomy    . Hernia repair    . Cardiac defibrillator placement    . Colonoscopy N/A 08/19/2013    Procedure: COLONOSCOPY;  Surgeon: Rogene Houston, MD;  Location: [facility];  Service: Endoscopy;  Laterality: N/A;  930   Family History  Problem Relation Age of Onset  . Coronary artery disease    . Colon cancer Father    Social History  Substance Use Topics  . Smoking  status: Former Smoker    Types: Cigarettes    Quit date: 05/14/1992  . Smokeless tobacco: Never Used  . Alcohol Use: No    Review of Systems  Constitutional: Negative for fever.  Respiratory: Positive for cough.   Gastrointestinal: Negative for vomiting and abdominal pain.  Genitourinary: Negative for dysuria, hematuria, discharge and penile pain.  Musculoskeletal: Positive for myalgias (right groin).  All other systems reviewed and are negative.  Allergies  Codeine  Home Medications   Prior to Admission medications   Medication Sig Start Date End Date Taking? Authorizing Provider  allopurinol (ZYLOPRIM) 300 MG tablet Take 300 mg by mouth daily.  10/20/13  Yes Historical Provider, MD  aspirin EC 81 MG tablet Take 162 mg by mouth daily. Take two tablets daily--this will be 162mg  daily 01/30/11  Yes Larey Dresser, MD  carvedilol (COREG) 12.5 MG tablet TAKE 1 AND 1/2 TABLET TWICE DAILY. 02/28/15  Yes Larey Dresser, MD  Cholecalciferol (VITAMIN D3) 1000 UNITS CAPS Take 1 capsule by mouth daily.    Yes Historical Provider, MD  hydrochlorothiazide (HYDRODIURIL) 25 MG tablet take 1/2 tablet by mouth once daily 05/30/15  Yes Larey Dresser, MD  losartan (COZAAR) 50 MG tablet TAKE 1 AND 1/2 TABLETS ONCE DAILY. 02/28/15  Yes Larey Dresser, MD  simvastatin (ZOCOR) 40 MG tablet TAKE ONE TABLET  BY MOUTH EVERY EVENING 04/16/13  Yes Larey Dresser, MD  spironolactone (ALDACTONE) 25 MG tablet take 1/2 tablet once daily 02/28/15  Yes Larey Dresser, MD  vitamin C (ASCORBIC ACID) 500 MG tablet Take 500 mg by mouth daily.     Yes Historical Provider, MD  acetaminophen (TYLENOL) 500 MG tablet Take 500 mg by mouth every 6 (six) hours as needed for mild pain.     Historical Provider, MD  cetirizine (ZYRTEC) 10 MG tablet Take 10 mg by mouth daily as needed for allergies.     Historical Provider, MD  cyclobenzaprine (FLEXERIL) 5 MG tablet Take 1 tablet (5 mg total) by mouth 2 (two) times daily as needed  for muscle spasms. 05/02/15   Larey Dresser, MD   BP 132/53 mmHg  Pulse 90  Temp(Src) 100.3 F (37.9 C) (Oral)  Resp 16  Ht 5\' 4"  (1.626 m)  Wt 182 lb (82.555 kg)  BMI 31.22 kg/m2  SpO2 96% Physical Exam  Constitutional: He is oriented to person, place, and time. He appears well-developed. No distress.  HENT:  Head: Normocephalic and atraumatic.  Eyes: Conjunctivae and EOM are normal.  Cardiovascular: Normal rate and regular rhythm.   Pulmonary/Chest: Effort normal. No stridor. No respiratory distress.  Abdominal: He exhibits no distension.  Genitourinary:    Right testis shows no mass and no tenderness. Left testis shows no mass and no tenderness. Uncircumcised.  Musculoskeletal: He exhibits no edema.  Neurological: He is alert and oriented to person, place, and time.  Skin: Skin is warm and dry.  Psychiatric: He has a normal mood and affect.  Nursing note and vitals reviewed.   ED Course  Procedures  DIAGNOSTIC STUDIES:  Oxygen Saturation is 96% on RA, adequate by my interpretation.    COORDINATION OF CARE:  8:17 PM Will order blood work, UA, chest x-ray, and CT renal stone study. Will administer pain medication. Discussed treatment plan with pt at bedside and pt agreed to plan.  Labs Review Labs Reviewed  CBC WITH DIFFERENTIAL/PLATELET - Abnormal; Notable for the following:    RBC 3.43 (*)    Hemoglobin 11.1 (*)    HCT 33.6 (*)    Platelets 86 (*)    Monocytes Absolute 2.0 (*)    All other components within normal limits  COMPREHENSIVE METABOLIC PANEL - Abnormal; Notable for the following:    Sodium 133 (*)    Glucose, Bld 221 (*)    BUN 44 (*)    Creatinine, Ser 1.55 (*)    Calcium 8.5 (*)    GFR calc non Af Amer 39 (*)    GFR calc Af Amer 45 (*)    All other components within normal limits  URINALYSIS, ROUTINE W REFLEX MICROSCOPIC (NOT AT [facility]) - Abnormal; Notable for the following:    Protein, ur 30 (*)    All other components within normal limits   BRAIN NATRIURETIC PEPTIDE - Abnormal; Notable for the following:    B Natriuretic Peptide 205.0 (*)    All other components within normal limits  URINE MICROSCOPIC-ADD ON - Abnormal; Notable for the following:    Squamous Epithelial / LPF 0-5 (*)    Bacteria, UA RARE (*)    Casts HYALINE CASTS (*)    All other components within normal limits    Imaging Review Dg Chest 2 View  07/14/2015  CLINICAL DATA:  Cough for 3 days EXAM: CHEST  2 VIEW COMPARISON:  12/29/2007 FINDINGS: Cardiac shadow is stable.  A defibrillator is again seen as well as postoperative change. No acute infiltrate or sizable effusion is seen. Degenerative changes of the thoracic spine are noted. IMPRESSION: No active cardiopulmonary disease. Electronically Signed   By: Inez Catalina M.D.   On: 07/14/2015 21:25   Ct Renal Stone Study  07/14/2015  CLINICAL DATA:  Right-sided groin pain since yesterday. History of prostate carcinoma and prostatectomy. History of nephrolithiasis. EXAM: CT ABDOMEN AND PELVIS WITHOUT CONTRAST TECHNIQUE: Multidetector CT imaging of the abdomen and pelvis was performed following the standard protocol without IV contrast. COMPARISON:  None. FINDINGS: Lung bases:  Clear.  Heart normal in size. Liver and spleen:  Unremarkable. Gallbladder and bile ducts: Dependent gallstone. Gallbladder only mildly distended. No acute cholecystitis. No bile duct dilation. Pancreas:  Partial fatty replacement.  No mass or inflammation. Adrenal glands:  Unremarkable. Kidneys, ureters, bladder: Low-density right renal masses, largest from the lower pole measuring 4.7 cm, consistent with cysts. 4 mm nonobstructing stone in the lower pole the right kidney. Possible small cyst in the posterior midpole the left kidney. No other masses. No hydronephrosis. Ureters are normal in course and in caliber. Bladder is unremarkable. NH vascular: Atherosclerotic calcification noted along the abdominal aorta and its branch vessels. Mild focal  dilation of the infrarenal abdominal aorta to 2.4 cm. Lymph nodes:  No adenopathy. Ascites:  None. Gastrointestinal: Scattered colonic diverticula. No diverticulitis. Colon otherwise unremarkable. Stomach and small bowel are normal. Appendix not definitively seen. No evidence of appendicitis. Musculoskeletal: Mild degenerative changes of the visualized spine. Bones demineralized. No osteoblastic or osteolytic lesions. IMPRESSION: 1. No acute findings. No ureteral stone or evidence of obstructive uropathy. 2. 4 mm nonobstructing stone in the lower pole the right kidney. Right renal cysts. 3. Gallstone.  No acute cholecystitis. 4. Scattered colonic diverticula without diverticulitis. Electronically Signed   By: Lajean Manes M.D.   On: 07/14/2015 21:33   I have personally reviewed and evaluated these images and lab results as part of my medical decision-making.  On repeat exam the patient states that he feels slightly better. We had a lengthy conversation about all results, including reassuring CT scan, x-ray, labs, though with patient's persistent mild fever. With a slight improvement in condition, and no other acute findings, the patient will follow up with his primary care physician. We discussed explicit return precautions, and the importance of monitoring his condition, and the possibility of early infection, but has not yet fully declare itself.   MDM   I personally performed the services described in this documentation, which was scribed in my presence. The recorded information has been reviewed and is accurate.    This well-appearing elderly male presents with new right inguinal pain. Here, patient has a soft, non-peritoneal abdomen, no gross genitourinary lesions, no superficial evidence for deep space infection. Patient's labs are largely reassuring aside from mild electrolyte abnormalities,. Patient is slightly thrombocytopenic, though not actively bleeding anywhere. Patient's cough was  evaluated with x-ray, which was reassuring. The patient continues to have mild fever, there is no evidence for systemic infection, and this may represent early ileus versus mild viral process. After lengthy conversation about these possibilities, the patient was discharged in stable condition with close outpatient follow-up.  Carmin Muskrat, MD 07/14/15 2149

## 2015-07-18 DIAGNOSIS — J209 Acute bronchitis, unspecified: Secondary | ICD-10-CM | POA: Diagnosis not present

## 2015-07-18 DIAGNOSIS — N202 Calculus of kidney with calculus of ureter: Secondary | ICD-10-CM | POA: Diagnosis not present

## 2015-07-18 DIAGNOSIS — I110 Hypertensive heart disease with heart failure: Secondary | ICD-10-CM | POA: Diagnosis not present

## 2015-08-10 DIAGNOSIS — Z85828 Personal history of other malignant neoplasm of skin: Secondary | ICD-10-CM | POA: Diagnosis not present

## 2015-08-10 DIAGNOSIS — L570 Actinic keratosis: Secondary | ICD-10-CM | POA: Diagnosis not present

## 2015-08-27 ENCOUNTER — Other Ambulatory Visit: Payer: Self-pay | Admitting: Cardiology

## 2015-08-30 DIAGNOSIS — I951 Orthostatic hypotension: Secondary | ICD-10-CM | POA: Diagnosis not present

## 2015-08-30 DIAGNOSIS — I10 Essential (primary) hypertension: Secondary | ICD-10-CM | POA: Diagnosis not present

## 2015-08-30 DIAGNOSIS — I110 Hypertensive heart disease with heart failure: Secondary | ICD-10-CM | POA: Diagnosis not present

## 2015-08-30 DIAGNOSIS — I129 Hypertensive chronic kidney disease with stage 1 through stage 4 chronic kidney disease, or unspecified chronic kidney disease: Secondary | ICD-10-CM | POA: Diagnosis not present

## 2015-09-01 DIAGNOSIS — I25110 Atherosclerotic heart disease of native coronary artery with unstable angina pectoris: Secondary | ICD-10-CM | POA: Diagnosis not present

## 2015-09-01 DIAGNOSIS — I951 Orthostatic hypotension: Secondary | ICD-10-CM | POA: Diagnosis not present

## 2015-09-01 DIAGNOSIS — I110 Hypertensive heart disease with heart failure: Secondary | ICD-10-CM | POA: Diagnosis not present

## 2015-09-01 DIAGNOSIS — I129 Hypertensive chronic kidney disease with stage 1 through stage 4 chronic kidney disease, or unspecified chronic kidney disease: Secondary | ICD-10-CM | POA: Diagnosis not present

## 2015-09-01 DIAGNOSIS — I10 Essential (primary) hypertension: Secondary | ICD-10-CM | POA: Diagnosis not present

## 2015-09-06 ENCOUNTER — Encounter: Payer: Self-pay | Admitting: Cardiology

## 2015-09-13 DIAGNOSIS — I10 Essential (primary) hypertension: Secondary | ICD-10-CM | POA: Diagnosis not present

## 2015-09-30 ENCOUNTER — Encounter (HOSPITAL_COMMUNITY): Payer: Medicare Other | Attending: Hematology & Oncology | Admitting: Hematology & Oncology

## 2015-09-30 ENCOUNTER — Encounter (HOSPITAL_COMMUNITY): Payer: Self-pay | Admitting: Hematology & Oncology

## 2015-09-30 ENCOUNTER — Ambulatory Visit (HOSPITAL_COMMUNITY)
Admission: RE | Admit: 2015-09-30 | Discharge: 2015-09-30 | Disposition: A | Discharge status: Home / Self Care | Payer: Medicare Other | Source: Ambulatory Visit | Attending: Cardiology | Admitting: Cardiology

## 2015-09-30 VITALS — BP 116/52 | HR 61 | Temp 97.7°F | Resp 18 | Ht 64.0 in | Wt 177.3 lb

## 2015-09-30 DIAGNOSIS — I6523 Occlusion and stenosis of bilateral carotid arteries: Secondary | ICD-10-CM

## 2015-09-30 DIAGNOSIS — D7289 Other specified disorders of white blood cells: Secondary | ICD-10-CM | POA: Diagnosis not present

## 2015-09-30 DIAGNOSIS — D61818 Other pancytopenia: Secondary | ICD-10-CM | POA: Diagnosis not present

## 2015-09-30 DIAGNOSIS — N183 Chronic kidney disease, stage 3 (moderate): Secondary | ICD-10-CM | POA: Diagnosis not present

## 2015-09-30 LAB — COMPREHENSIVE METABOLIC PANEL
ALBUMIN: 4.4 g/dL (ref 3.5–5.0)
ALT: 19 U/L (ref 17–63)
AST: 18 U/L (ref 15–41)
Alkaline Phosphatase: 48 U/L (ref 38–126)
Anion gap: 6 (ref 5–15)
BUN: 33 mg/dL — AB (ref 6–20)
CO2: 25 mmol/L (ref 22–32)
Calcium: 9.2 mg/dL (ref 8.9–10.3)
Chloride: 105 mmol/L (ref 101–111)
Creatinine, Ser: 1.18 mg/dL (ref 0.61–1.24)
GFR calc Af Amer: >60 mL/min (ref >60)
GFR, EST NON AFRICAN AMERICAN: 54 mL/min — AB (ref >60)
Glucose, Bld: 108 mg/dL — ABNORMAL HIGH (ref 65–99)
POTASSIUM: 5.3 mmol/L — AB (ref 3.5–5.1)
Sodium: 136 mmol/L (ref 135–145)
TOTAL PROTEIN: 7.4 g/dL (ref 6.5–8.1)
Total Bilirubin: 0.8 mg/dL (ref 0.3–1.2)

## 2015-09-30 LAB — CBC WITH DIFFERENTIAL/PLATELET
Basophils Absolute: 0 10*3/uL (ref 0.0–0.1)
Basophils Relative: 0 %
Eosinophils Absolute: 0 10*3/uL (ref 0.0–0.7)
Eosinophils Relative: 0 %
HEMATOCRIT: 36.1 % — AB (ref 39.0–52.0)
Hemoglobin: 11.7 g/dL — ABNORMAL LOW (ref 13.0–17.0)
LYMPHS PCT: 30 %
Lymphs Abs: 0.9 10*3/uL (ref 0.7–4.0)
MCH: 30.6 pg (ref 26.0–34.0)
MCHC: 32.4 g/dL (ref 30.0–36.0)
MCV: 94.5 fL (ref 78.0–100.0)
MONO ABS: 0.8 10*3/uL (ref 0.1–1.0)
Monocytes Relative: 28 %
NEUTROS ABS: 1.3 10*3/uL — AB (ref 1.7–7.7)
NEUTROS PCT: 43 %
Platelets: 116 10*3/uL — ABNORMAL LOW (ref 150–400)
RBC: 3.82 MIL/uL — ABNORMAL LOW (ref 4.22–5.81)
RDW: 14 % (ref 11.5–15.5)
WBC: 3 10*3/uL — ABNORMAL LOW (ref 4.0–10.5)

## 2015-09-30 LAB — FERRITIN: Ferritin: 106 ng/mL (ref 24–336)

## 2015-09-30 LAB — FOLATE: Folate: 27.2 ng/mL (ref >5.9)

## 2015-09-30 LAB — LACTATE DEHYDROGENASE: LDH: 182 U/L (ref 98–192)

## 2015-09-30 LAB — VITAMIN B12: VITAMIN B 12: 215 pg/mL (ref 180–914)
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
## 2015-09-30 NOTE — Patient Instructions (Addendum)
[place] at [facility] Discharge Instructions  RECOMMENDATIONS MADE BY THE CONSULTANT AND ANY TEST RESULTS WILL BE SENT TO YOUR REFERRING PHYSICIAN.  We are going to do blood work today and return in 2 weeks for Dr. Whitney Muse.     Thank you for choosing [place] at [facility] to provide your oncology and hematology care.  To afford each patient quality time with our provider, please arrive at least 15 minutes before your scheduled appointment time.   Beginning January 23rd 2017 lab work for the [facility] will be done in the  Main lab at [facility] on 1st floor. If you have a lab appointment with the [facility] please come in thru the  Main Entrance and check in at the main information desk  You need to re-schedule your appointment should you arrive 10 or more minutes late.  We strive to give you quality time with our providers, and arriving late affects you and other patients whose appointments are after yours.  Also, if you no show three or more times for appointments you may be dismissed from the clinic at the providers discretion.     Again, thank you for choosing [facility].  Our hope is that these requests will decrease the amount of time that you wait before being seen by our physicians.       _____________________________________________________________  Should you have questions after your visit to [facility], please contact our office at [phone] between the hours of 8:30 a.m. and 4:30 p.m.  Voicemails left after 4:30 p.m. will not be returned until the following business day.  For prescription refill requests, have your pharmacy contact our office.         Resources For Cancer Patients and their Caregivers ? American Cancer Society: Can assist with transportation, wigs, general needs, runs Look Good Feel Better.        364-047-4475 ? Cancer Care: Provides financial assistance, online  support groups, medication/co-pay assistance.  1-800-813-HOPE (628)268-1950) ? [facility] Assists [place] cancer patients and their families through emotional , educational and financial support.  367-323-6374 ? [place] DSS Where to apply for food stamps, Medicaid and utility assistance. 972-717-6947 ? RCATS: Transportation to medical appointments. 838-613-8465 ? Social Security Administration: May apply for disability if have a Stage IV cancer. 801-256-7771 972-027-0077 ? [employer], Disability and Transit Services: Assists with nutrition, care and transit needs. [place] Support Programs: @10RELATIVEDAYS @ > Cancer Support Group  2nd Tuesday of the month 1pm-2pm, Journey Room  > Creative Journey  3rd Tuesday of the month 1130am-1pm, Journey Room  > Look Good Feel Better  1st Wednesday of the month 10am-12 noon, Journey Room (Call [place] to register [phone])

## 2015-10-02 ENCOUNTER — Encounter (HOSPITAL_COMMUNITY): Payer: Self-pay | Admitting: Hematology & Oncology

## 2015-10-03 LAB — PROTEIN ELECTROPHORESIS, SERUM
A/G Ratio: 1.3 (ref 0.7–1.7)
Albumin ELP: 3.8 g/dL (ref 2.9–4.4)
Alpha-1-Globulin: 0.2 g/dL (ref 0.0–0.4)
Alpha-2-Globulin: 0.8 g/dL (ref 0.4–1.0)
BETA GLOBULIN: 0.8 g/dL (ref 0.7–1.3)
Gamma Globulin: 1 g/dL (ref 0.4–1.8)
Globulin, Total: 2.9 g/dL (ref 2.2–3.9)
Total Protein ELP: 6.7 g/dL (ref 6.0–8.5)

## 2015-10-03 LAB — PATHOLOGIST SMEAR REVIEW

## 2015-10-04 LAB — IMMUNOFIXATION ELECTROPHORESIS
IGG (IMMUNOGLOBIN G), SERUM: 972 mg/dL (ref 700–1600)
IgA: 104 mg/dL (ref 61–437)
IgM, Serum: 116 mg/dL (ref 15–143)
TOTAL PROTEIN ELP: 6.9 g/dL (ref 6.0–8.5)

## 2015-10-11 DIAGNOSIS — I25119 Atherosclerotic heart disease of native coronary artery with unspecified angina pectoris: Secondary | ICD-10-CM | POA: Diagnosis not present

## 2015-10-11 DIAGNOSIS — I10 Essential (primary) hypertension: Secondary | ICD-10-CM | POA: Diagnosis not present

## 2015-10-14 ENCOUNTER — Encounter (HOSPITAL_COMMUNITY): Payer: Medicare Other | Attending: Hematology & Oncology | Admitting: Hematology & Oncology

## 2015-10-14 VITALS — BP 138/55 | HR 60 | Temp 97.7°F | Resp 18 | Wt 179.0 lb

## 2015-10-14 DIAGNOSIS — E538 Deficiency of other specified B group vitamins: Secondary | ICD-10-CM

## 2015-10-14 DIAGNOSIS — D61818 Other pancytopenia: Secondary | ICD-10-CM | POA: Insufficient documentation

## 2015-10-14 DIAGNOSIS — N183 Chronic kidney disease, stage 3 (moderate): Secondary | ICD-10-CM

## 2015-10-14 MED ORDER — CYANOCOBALAMIN 1000 MCG/ML IJ SOLN
INTRAMUSCULAR | Status: AC
  Filled 2015-10-14: qty 1

## 2015-10-14 MED ORDER — CYANOCOBALAMIN 1000 MCG/ML IJ SOLN
1000.0000 ug | Freq: Once | INTRAMUSCULAR | Status: AC
  Administered 2015-10-14: 1000 ug via INTRAMUSCULAR

## 2015-10-14 MED ORDER — CYANOCOBALAMIN 1000 MCG/ML IJ SOLN: 1000.0000 ug | Freq: Once | INTRAMUSCULAR | Status: DC

## 2015-10-14 NOTE — Patient Instructions (Addendum)
[place] at [facility] Discharge Instructions  RECOMMENDATIONS MADE BY THE CONSULTANT AND ANY TEST RESULTS WILL BE SENT TO YOUR REFERRING PHYSICIAN.  Exam done and seen today by Dr. Gustavus Bryant reviewed today. Going to start weekly B12 injections today for 4 weeks. One today and then for 3 weeks Then to B12 pills once a month Going to schedule you a bone marrow biopsy  Return to see the doctor 2 weeks after bone marrow biopsy Please call the clinic if you have any questions or concerns   Thank you for choosing [place] at [facility] to provide your oncology and hematology care.  To afford each patient quality time with our provider, please arrive at least 15 minutes before your scheduled appointment time.   Beginning January 23rd 2017 lab work for the [facility] will be done in the  Main lab at [facility] on 1st floor. If you have a lab appointment with the [facility] please come in thru the  Main Entrance and check in at the main information desk  You need to re-schedule your appointment should you arrive 10 or more minutes late.  We strive to give you quality time with our providers, and arriving late affects you and other patients whose appointments are after yours.  Also, if you no show three or more times for appointments you may be dismissed from the clinic at the providers discretion.     Again, thank you for choosing [facility].  Our hope is that these requests will decrease the amount of time that you wait before being seen by our physicians.       _____________________________________________________________  Should you have questions after your visit to [facility], please contact our office at [phone] between the hours of 8:30 a.m. and 4:30 p.m.  Voicemails left after 4:30 p.m. will not be returned until the following business day.  For prescription refill requests, have your pharmacy  contact our office.         Resources For Cancer Patients and their Caregivers ? American Cancer Society: Can assist with transportation, wigs, general needs, runs Look Good Feel Better.        323-476-3010 ? Cancer Care: Provides financial assistance, online support groups, medication/co-pay assistance.  1-800-813-HOPE (385) 254-0826) ? [facility] Assists [place] cancer patients and their families through emotional , educational and financial support.  712-575-1797 ? [place] DSS Where to apply for food stamps, Medicaid and utility assistance. 801-436-9679 ? RCATS: Transportation to medical appointments. 662 621 5135 ? Social Security Administration: May apply for disability if have a Stage IV cancer. 607 194 7799 925-369-2028 ? [employer], Disability and Transit Services: Assists with nutrition, care and transit needs. [place] Support Programs: '@10RELATIVEDAYS' @ > Cancer Support Group  2nd Tuesday of the month 1pm-2pm, Journey Room  > Creative Journey  3rd Tuesday of the month 1130am-1pm, Journey Room  > Look Good Feel Better  1st Wednesday of the month 10am-12 noon, Journey Room (Call [place] to register [phone])

## 2015-10-14 NOTE — Progress Notes (Signed)
[place] at [place] NOTE  Patient Care Team: Sinda Du, MD as PCP - General (Internal Medicine)  CHIEF COMPLAINTS/PURPOSE OF CONSULTATION:  Pancytopenia  HISTORY OF PRESENTING ILLNESS:  [name] 82 y.o. male is here because of pancytopenia. Labs dating back to November of 2016 show a stable pancytopenia of all 3 cell lines. Review of EPIC shows normal CBC in 2014.  [name] returns to the [place] today accompanied by his wife.  He is perfectly amenable to obtaining a bone marrow biopsy whenever it's convenient, noting he'd rather get it done sooner than later. He remarks "I won't go to the doctor unless I do what they say. What's the use?"  With regard to B12 replacement, they would like to do one B12 shot here to start with, and then his wife will administer the rest at home.  His lab work was reviewed at length during his appointment today. He knows to watch his potassium, but remarks that he will eat a banana "every once in a while."  At the end of his appointment, he remarks "so I'm in pretty good shape for the shape I'm in, right?"  Since our last visit he denies any fever or chills, no night sweats. No change in appetite or energy levels.   MEDICAL HISTORY:  Past Medical History  Diagnosis Date  . Coronary artery disease     s/p CABG 8/06 LIMA-LAD, SVG-CFX, SVG-distal RCA  . Nephrolithiasis     hx of  . Systolic CHF ([place])     systolic  . Hypertension   . Prostate cancer ([place])   . Hyperlipidemia     mixed  . ICD (implantable cardiac defibrillator) in place   . BPH (benign prostatic hypertrophy)     SURGICAL HISTORY: Past Surgical History  Procedure Laterality Date  . Prostate surgery    . Coronary artery bypass graft      8/06 with left anterior descending artery, saphenous vein graft to circumflex, saphenous vein graft to distal right coronary artery  . Colonoscopy    . Polypectomy    . Hernia repair    .  Cardiac defibrillator placement    . Colonoscopy N/A 08/19/2013    Procedure: COLONOSCOPY;  Surgeon: Rogene Houston, MD;  Location: [facility];  Service: Endoscopy;  Laterality: N/A;  930    SOCIAL HISTORY: Social History   Social History  . Marital Status: Married    Spouse Name: N/A  . Number of Children: N/A  . Years of Education: N/A   Occupational History  . Retired    Social History Main Topics  . Smoking status: Former Smoker -- 0.25 packs/day for 44 years    Types: Cigarettes    Quit date: 05/14/1992  . Smokeless tobacco: Never Used  . Alcohol Use: No  . Drug Use: No  . Sexual Activity: Not on file   Other Topics Concern  . Not on file   Social History Narrative   Married 63 years on June 16th. He says "she chased me for 7 years and I said "that's it, I'm tired.'" 2 sons; 3 grandchildren, all boys. 2 great-grandchildren, all boys. Another one on the way that's a boy. All of his siblings have girls. He was a "TV person." Fixed them, sold them; etc. Repair, sales. Born in [place]. Smoked for 40 years; quit in '94. He had a heart attack and quit. Denies any problems with alcohol, saying "no, I don't  drink." Used to fish as a hobby, in both salt and freshwater; saltwater mostly.  FAMILY HISTORY: Family History  Problem Relation Age of Onset  . Coronary artery disease    . Colon cancer Father    His mother died when he was 19, and his sister was 67 months old. Mother died of meningitis. Father lived to be 71, then died in 72 of colon cancer. 3 brothers; 1 sister. 1 half-brother, and 2 half-sisters. 2 oldest are passed. He doesn't know of any brothers having prostate cancer or bladder/kidney problems. One of his brothers had cancer of the lung. His father remarried when he was 89.  ALLERGIES:  is allergic to codeine.  MEDICATIONS:  Current Outpatient Prescriptions  Medication Sig Dispense Refill  . acetaminophen (TYLENOL) 500 MG tablet Take 1 tablet (500 mg  total) by mouth every 6 (six) hours as needed for mild pain or fever. 30 tablet 0  . allopurinol (ZYLOPRIM) 300 MG tablet Take 300 mg by mouth daily.     [place] aspirin EC 81 MG tablet Take 162 mg by mouth daily. Take two tablets daily--this will be 150m daily    . carvedilol (COREG) 12.5 MG tablet Take 1.5 tablets (18.75 mg total) by mouth 2 (two) times daily. 90 tablet 6  . cetirizine (ZYRTEC) 10 MG tablet Take 10 mg by mouth daily as needed for allergies.     . Cholecalciferol (VITAMIN D3) 1000 UNITS CAPS Take 1 capsule by mouth daily.     . hydrochlorothiazide (HYDRODIURIL) 25 MG tablet take 1/2 tablet by mouth once daily 15 tablet 5  . losartan (COZAAR) 50 MG tablet take 1 and 1/2 tablets by mouth once daily 45 tablet 6  . simvastatin (ZOCOR) 40 MG tablet TAKE ONE TABLET BY MOUTH EVERY EVENING 30 tablet 0  . spironolactone (ALDACTONE) 25 MG tablet Take 0.5 tablets (12.5 mg total) by mouth daily. 15 tablet 6  . vitamin C (ASCORBIC ACID) 500 MG tablet Take 500 mg by mouth daily.       No current facility-administered medications for this visit.    Review of Systems  Constitutional: Negative.  Negative for fever, chills, weight loss and malaise/fatigue.  HENT: Negative.  Negative for congestion, hearing loss, nosebleeds, sore throat and tinnitus.   Eyes: Negative.  Negative for blurred vision, double vision, pain and discharge.  Respiratory: Negative.  Negative for cough, hemoptysis, sputum production, shortness of breath and wheezing.   Cardiovascular: Negative.  Negative for chest pain, palpitations, claudication, leg swelling and PND.  Gastrointestinal: Negative.  Negative for heartburn, nausea, vomiting, abdominal pain, diarrhea, constipation, blood in stool and melena.  Genitourinary: Negative.  Negative for dysuria, urgency, frequency and hematuria.  Musculoskeletal: Negative.  Negative for myalgias, joint pain and falls.  Skin: Negative.  Negative for itching and rash.       Itching on  his lower back where the belt is.   Neurological: Negative.  Negative for dizziness, tingling, tremors, sensory change, speech change, focal weakness, seizures, loss of consciousness, weakness and headaches.  Endo/Heme/Allergies: Negative.  Does not bruise/bleed easily.  Psychiatric/Behavioral: Negative.  Negative for depression, suicidal ideas, memory loss and substance abuse. The patient is not nervous/anxious and does not have insomnia.   All other systems reviewed and are negative.  14 point ROS was done and is otherwise as detailed above or in HPI   PHYSICAL EXAMINATION: ECOG PERFORMANCE STATUS: 1 - Symptomatic but completely ambulatory  Filed Vitals:   10/14/15 1046  BP: 138/55  Pulse: 60  Temp: 97.7 F (36.5 C)  Resp: 18   Filed Weights   10/14/15 1046  Weight: 179 lb (81.194 kg)    Physical Exam  Constitutional: He is oriented to person, place, and time and well-developed, well-nourished, and in no distress.  Appear somewhat younger than stated age  HENT:  Head: Normocephalic and atraumatic.  Nose: Nose normal.  Mouth/Throat: Oropharynx is clear and moist. No oropharyngeal exudate.  Eyes: Conjunctivae and EOM are normal. Pupils are equal, round, and reactive to light. Right eye exhibits no discharge. Left eye exhibits no discharge. No scleral icterus.  Neck: Normal range of motion. Neck supple. No tracheal deviation present. No thyromegaly present.  Cardiovascular: Normal rate, regular rhythm and normal heart sounds.  Exam reveals no gallop and no friction rub.   No murmur heard. Palpable pacer/defibrillator  Pulmonary/Chest: Effort normal and breath sounds normal. He has no wheezes. He has no rales.  Abdominal: Soft. Bowel sounds are normal. He exhibits no distension and no mass. There is no tenderness. There is no rebound and no guarding.  Musculoskeletal: Normal range of motion. He exhibits no edema.  Lymphadenopathy:    He has no cervical adenopathy.    Neurological: He is alert and oriented to person, place, and time. He has normal reflexes. No cranial nerve deficit. Gait normal. Coordination normal.  Skin: Skin is warm and dry. No rash noted.  Psychiatric: Mood, memory, affect and judgment normal.  Nursing note and vitals reviewed.   LABORATORY DATA:  I have reviewed the data as listed Lab Results  Component Value Date   WBC 3.0* 09/30/2015   HGB 11.7* 09/30/2015   HCT 36.1* 09/30/2015   MCV 94.5 09/30/2015   PLT 116* 09/30/2015   CMP     Component Value Date/Time   NA 136 09/30/2015 1500   K 5.3* 09/30/2015 1500   CL 105 09/30/2015 1500   CO2 25 09/30/2015 1500   GLUCOSE 108* 09/30/2015 1500   BUN 33* 09/30/2015 1500   CREATININE 1.18 09/30/2015 1500   CREATININE 1.43* 07/24/2013 1623   CALCIUM 9.2 09/30/2015 1500   PROT 7.4 09/30/2015 1500   ALBUMIN 4.4 09/30/2015 1500   AST 18 09/30/2015 1500   ALT 19 09/30/2015 1500   ALKPHOS 48 09/30/2015 1500   BILITOT 0.8 09/30/2015 1500   GFRNONAA 54* 09/30/2015 1500   GFRAA >60 09/30/2015 1500    Results for TAYLEN, OSORTO (MRN [id number]) as of 10/14/2015 11:48  Ref. Range 09/30/2015 15:00  Total Protein ELP Latest Ref Range: 6.0-8.5 g/dL 6.9  IgG (Immunoglobin G), Serum Latest Ref Range: 651 320 4022 mg/dL 972  IgA Latest Ref Range: 61-437 mg/dL 104  IgM, Serum Latest Ref Range: 15-143 mg/dL 116  Immunofixation Result, Serum Unknown Comment           RADIOGRAPHIC STUDIES: I have personally reviewed the radiological images as listed and agreed with the findings in the report. No results found.  ASSESSMENT & PLAN:  Pancytopenia CKD, Stage 3  I reviewed the patients blood counts with he and his wife today. We reviewed laboratory studies obtained at his last visit. B12 is low and I have recommended B12 replacement. They would like to begin this here and undergo teaching. His wife will continue ongoing B12 at home.  I advised the patient and his wife however that I do  not feel his B12 level explains his pancytopenia.   We discussed other potential causes including MDS, which would require a BMBX for diagnosis.  He wishes to proceed. We discussed risks/benefits of the procedure. We will regroup 2 weeks post for discussion of results and additional recommendations.   Orders Placed This Encounter  Procedures  . CT Biopsy    Standing Status: Future     Number of Occurrences:      Standing Expiration Date: 10/13/2016    Order Specific Question:  Lab orders requested (DO NOT place separate lab orders, these will be automatically ordered during procedure specimen collection):    Answer:  Surgical Pathology    Order Specific Question:  Reason for Exam (SYMPTOM  OR DIAGNOSIS REQUIRED)    Answer:  BMBX and aspirate, flow cytometry and cytogenetics. Pancytopenia    Order Specific Question:  Preferred imaging location?    Answer:  [facility]  . CBC with Differential    Standing Status: Future     Number of Occurrences:      Standing Expiration Date: 10/13/2016   If peripheral labs are unrevealing I will encourage him to have a BMBX.   All questions were answered. The patient knows to call the clinic with any problems, questions or concerns.  This document serves as a record of services personally performed by Ancil Linsey, MD. It was created on her behalf by Toni Amend, a trained medical scribe. The creation of this record is based on the scribe's personal observations and the provider's statements to them. This document has been checked and approved by the attending provider.  I have reviewed the above documentation for accuracy and completeness and I agree with the above.  This note was electronically signed.  Molli Hazard, MD  10/14/2015 11:49 AM

## 2015-10-18 ENCOUNTER — Encounter (HOSPITAL_COMMUNITY): Payer: Self-pay | Admitting: Hematology & Oncology

## 2015-10-26 ENCOUNTER — Other Ambulatory Visit: Payer: Self-pay | Admitting: Cardiology

## 2015-10-26 ENCOUNTER — Other Ambulatory Visit: Payer: Self-pay | Admitting: General Surgery

## 2015-10-27 ENCOUNTER — Ambulatory Visit (HOSPITAL_COMMUNITY)
Admission: RE | Admit: 2015-10-27 | Discharge: 2015-10-27 | Disposition: A | Discharge status: Home / Self Care | Payer: Medicare Other | Source: Ambulatory Visit | Attending: Hematology & Oncology | Admitting: Hematology & Oncology

## 2015-10-27 ENCOUNTER — Encounter (HOSPITAL_COMMUNITY): Payer: Self-pay

## 2015-10-27 DIAGNOSIS — I5020 Unspecified systolic (congestive) heart failure: Secondary | ICD-10-CM | POA: Diagnosis not present

## 2015-10-27 DIAGNOSIS — Z951 Presence of aortocoronary bypass graft: Secondary | ICD-10-CM | POA: Diagnosis not present

## 2015-10-27 DIAGNOSIS — E782 Mixed hyperlipidemia: Secondary | ICD-10-CM | POA: Insufficient documentation

## 2015-10-27 DIAGNOSIS — N400 Enlarged prostate without lower urinary tract symptoms: Secondary | ICD-10-CM | POA: Diagnosis not present

## 2015-10-27 DIAGNOSIS — I110 Hypertensive heart disease with heart failure: Secondary | ICD-10-CM | POA: Insufficient documentation

## 2015-10-27 DIAGNOSIS — Z87891 Personal history of nicotine dependence: Secondary | ICD-10-CM | POA: Diagnosis not present

## 2015-10-27 DIAGNOSIS — Z95810 Presence of automatic (implantable) cardiac defibrillator: Secondary | ICD-10-CM | POA: Diagnosis not present

## 2015-10-27 DIAGNOSIS — I2510 Atherosclerotic heart disease of native coronary artery without angina pectoris: Secondary | ICD-10-CM | POA: Insufficient documentation

## 2015-10-27 DIAGNOSIS — Z87442 Personal history of urinary calculi: Secondary | ICD-10-CM | POA: Insufficient documentation

## 2015-10-27 DIAGNOSIS — Z7982 Long term (current) use of aspirin: Secondary | ICD-10-CM | POA: Insufficient documentation

## 2015-10-27 DIAGNOSIS — D61818 Other pancytopenia: Secondary | ICD-10-CM

## 2015-10-27 DIAGNOSIS — D7589 Other specified diseases of blood and blood-forming organs: Secondary | ICD-10-CM | POA: Diagnosis not present

## 2015-10-27 LAB — CBC
HEMATOCRIT: 34.2 % — AB (ref 39.0–52.0)
HEMOGLOBIN: 11.6 g/dL — AB (ref 13.0–17.0)
MCH: 31.2 pg (ref 26.0–34.0)
MCHC: 33.9 g/dL (ref 30.0–36.0)
MCV: 91.9 fL (ref 78.0–100.0)
Platelets: 104 10*3/uL — ABNORMAL LOW (ref 150–400)
RBC: 3.72 MIL/uL — ABNORMAL LOW (ref 4.22–5.81)
RDW: 14.2 % (ref 11.5–15.5)
WBC: 3.2 10*3/uL — AB (ref 4.0–10.5)

## 2015-10-27 LAB — PROTIME-INR
INR: 1.1 (ref 0.00–1.49)
Prothrombin Time: 14 s (ref 11.6–15.2)

## 2015-10-27 LAB — APTT: APTT: 29 s (ref 24–37)

## 2015-10-27 LAB — BONE MARROW EXAM: Bone Marrow Exam: 431

## 2015-10-27 MED ORDER — FENTANYL CITRATE (PF) 100 MCG/2ML IJ SOLN
INTRAMUSCULAR | Status: AC
  Filled 2015-10-27: qty 4

## 2015-10-27 MED ORDER — MIDAZOLAM HCL 2 MG/2ML IJ SOLN
INTRAMUSCULAR | Status: AC | PRN
  Administered 2015-10-27: 1 mg via INTRAVENOUS

## 2015-10-27 MED ORDER — SODIUM CHLORIDE 0.9 % IV SOLN
INTRAVENOUS | Status: DC
  Administered 2015-10-27: 09:00:00 via INTRAVENOUS

## 2015-10-27 MED ORDER — MIDAZOLAM HCL 2 MG/2ML IJ SOLN
INTRAMUSCULAR | Status: AC
  Filled 2015-10-27: qty 4

## 2015-10-27 MED ORDER — FENTANYL CITRATE (PF) 100 MCG/2ML IJ SOLN
INTRAMUSCULAR | Status: AC | PRN
  Administered 2015-10-27: 50 ug via INTRAVENOUS

## 2015-10-27 NOTE — H&P (Signed)
Chief Complaint: Patient was seen in consultation today for bone marrow biopsy at the request of Penland,Shannon K  Referring Physician(s): Patrici Ranks  Supervising Physician: Arne Cleveland  Patient Status: Outpatient  History of Present Illness: [name] is a 82 y.o. male with pancytopenia. He is referred for bone marrow biopsy as part of his workup. PMHx, meds, labs, allergies reviewed. Has been NPO this am. Wife at bedside.  Past Medical History  Diagnosis Date  . Coronary artery disease     s/p CABG 8/06 LIMA-LAD, SVG-CFX, SVG-distal RCA  . Nephrolithiasis     hx of  . Systolic CHF ([place])     systolic  . Hypertension   . Prostate cancer ([place])   . Hyperlipidemia     mixed  . ICD (implantable cardiac defibrillator) in place   . BPH (benign prostatic hypertrophy)     Past Surgical History  Procedure Laterality Date  . Prostate surgery    . Coronary artery bypass graft      8/06 with left anterior descending artery, saphenous vein graft to circumflex, saphenous vein graft to distal right coronary artery  . Colonoscopy    . Polypectomy    . Hernia repair    . Cardiac defibrillator placement    . Colonoscopy N/A 08/19/2013    Procedure: COLONOSCOPY;  Surgeon: Rogene Houston, MD;  Location[facility];  Service: Endoscopy;  Laterality: N/A;  930    Allergies: Codeine  Medications: Prior to Admission medications   Medication Sig Start Date End Date Taking? Authorizing Provider  acetaminophen (TYLENOL) 500 MG tablet Take 1 tablet (500 mg total) by mouth every 6 (six) hours as needed for mild pain or fever. 07/14/15  Yes Carmin Muskrat, MD  allopurinol (ZYLOPRIM) 300 MG tablet Take 300 mg by mouth daily.  10/20/13  Yes Historical Provider, MD  aspirin EC 81 MG tablet Take 162 mg by mouth daily. Take two tablets daily--this will be 158m daily 01/30/11  Yes DLarey Dresser MD  carvedilol (COREG) 12.5 MG tablet Take 1.5 tablets (18.75 mg total) by  mouth 2 (two) times daily. 08/30/15  Yes DLarey Dresser MD  cetirizine (ZYRTEC) 10 MG tablet Take 10 mg by mouth daily as needed for allergies.    Yes Historical Provider, MD  Cholecalciferol (VITAMIN D3) 1000 UNITS CAPS Take 1 capsule by mouth daily.    Yes Historical Provider, MD  hydrochlorothiazide (HYDRODIURIL) 25 MG tablet take 1/2 tablet by mouth once daily 10/26/15  Yes DLarey Dresser MD  losartan (COZAAR) 50 MG tablet take 1 and 1/2 tablets by mouth once daily 08/30/15  Yes DLarey Dresser MD  simvastatin (ZOCOR) 40 MG tablet TAKE ONE TABLET BY MOUTH EVERY EVENING 04/16/13  Yes DLarey Dresser MD  spironolactone (ALDACTONE) 25 MG tablet Take 0.5 tablets (12.5 mg total) by mouth daily. 08/30/15  Yes DLarey Dresser MD  vitamin C (ASCORBIC ACID) 500 MG tablet Take 500 mg by mouth daily.     Yes Historical Provider, MD  cyanocobalamin (,VITAMIN B-12,) 1000 MCG/ML injection Inject 1 mL (1,000 mcg total) into the muscle once. 10/14/15   SPatrici Ranks MD     Family History  Problem Relation Age of Onset  . Coronary artery disease    . Colon cancer Father     Social History   Social History  . Marital Status: Married    Spouse Name: N/A  . Number of Children: N/A  . Years  of Education: N/A   Occupational History  . Retired    Social History Main Topics  . Smoking status: Former Smoker -- 0.25 packs/day for 44 years    Types: Cigarettes    Quit date: 05/14/1992  . Smokeless tobacco: Never Used  . Alcohol Use: No  . Drug Use: No  . Sexual Activity: Not Asked   Other Topics Concern  . None   Social History Narrative     Review of Systems: A 12 point ROS discussed and pertinent positives are indicated in the HPI above.  All other systems are negative.  Review of Systems  Vital Signs: BP 139/50 mmHg  Pulse 65  Temp(Src) 98.3 F (36.8 C) (Oral)  Resp 16  Ht _0  (1.626 m)  Wt 179 lb (81.194 kg)  BMI 30.71 kg/m2  SpO2 97%  Physical Exam  Constitutional: He  is oriented to person, place, and time. He appears well-developed and well-nourished.  HENT:  Head: Normocephalic.  Mouth/Throat: Oropharynx is clear and moist.  Neck: Normal range of motion. No tracheal deviation present.  Cardiovascular: Normal rate, regular rhythm and normal heart sounds.   Pulmonary/Chest: Effort normal and breath sounds normal. No respiratory distress.  Neurological: He is alert and oriented to person, place, and time.  Psychiatric: He has a normal mood and affect. Judgment normal.    Mallampati Score:  MD Evaluation Airway: WNL Heart: WNL Abdomen: WNL Chest/ Lungs: WNL ASA  Classification: 3 Mallampati/Airway Score: One  Labs:  CBC:  Recent Labs  07/14/15 2015 09/30/15 1500 10/27/15 0915  WBC 7.2 3.0* 3.2*  HGB 11.1* 11.7* 11.6*  HCT 33.6* 36.1* 34.2*  PLT 86* 116* 104*    COAGS:  Recent Labs  10/27/15 0915  INR 1.10  APTT 29    BMP:  Recent Labs  12/09/14 0814 12/10/14 0811 12/21/14 0842 07/14/15 2015 09/30/15 1500  NA 137  --  137 133* 136  K 5.4* 5.2* 4.9 4.6 5.3*  CL 104  --  105 101 105  CO2 29  --  _1 GLUCOSE 110*  --  130* 221* 108*  BUN 35*  --  43* 44* 33*  CALCIUM 9.5  --  9.6 8.5* 9.2  CREATININE 1.47  --  1.45 1.55* 1.18  GFRNONAA  --   --   --  39* 54*  GFRAA  --   --   --  45* >60    LIVER FUNCTION TESTS:  Recent Labs  07/14/15 2015 09/30/15 1500  BILITOT 0.7 0.8  AST 19 18  ALT 18 19  ALKPHOS 48 48  PROT 7.0 7.4  ALBUMIN 3.9 4.4    TUMOR MARKERS: No results for input(s): AFPTM, CEA, CA199, CHROMGRNA in the last 8760 hours.  Assessment and Plan: Pancytopenia For CT guided BM biopsy today Risks and Benefits discussed with the patient including, but not limited to bleeding, infection, damage to adjacent structures or low yield requiring additional tests. All of the patient's questions were answered, patient is agreeable to proceed. Consent signed and in chart.    Thank you for this  interesting consult.  I greatly enjoyed meeting [name] and look forward to participating in their care.  A copy of this report was sent to the requesting provider on this date.  Electronically Signed: Ascencion Dike 10/27/2015, 10:36 AM   I spent a total of  20 minutes in face to face in clinical consultation, greater than 50% of which was counseling/coordinating care  for bone marrow biopsy

## 2015-10-27 NOTE — Procedures (Signed)
CT-guided  R iliac bone marrow aspiration and core biopsy No complication No blood loss. See complete dictation in [facility] PACS  

## 2015-10-27 NOTE — Discharge Instructions (Signed)
Bone Marrow Aspiration and Bone Marrow Biopsy, Care After °Refer to this sheet in the next few weeks. These instructions provide you with information about caring for yourself after your procedure. Your health care provider may also give you more specific instructions. Your treatment has been planned according to current medical practices, but problems sometimes occur. Call your health care provider if you have any problems or questions after your procedure. °WHAT TO EXPECT AFTER THE PROCEDURE °After your procedure, it is common to have: °· Soreness or tenderness around the puncture site. °· Bruising. °HOME CARE INSTRUCTIONS °· Take medicines only as directed by your health care provider. °· Follow your health care provider's instructions about: °¨ Puncture site care. °¨ Bandage (dressing) changes and removal. °· Bathe and shower as directed by your health care provider. °· Check your puncture site every day for signs of infection. Watch for: °¨ Redness, swelling, or pain. °¨ Fluid, blood, or pus. °· Return to your normal activities as directed by your health care provider. °· Keep all follow-up visits as directed by your health care provider. This is important. °SEEK MEDICAL CARE IF: °· You have a fever. °· You have uncontrollable bleeding. °· You have redness, swelling, or pain at the site of your puncture. °· You have fluid, blood, or pus coming from your puncture site. °  °This information is not intended to replace advice given to you by your health care provider. Make sure you discuss any questions you have with your health care provider. °  °Document Released: 11/17/2004 Document Revised: 09/14/2014 Document Reviewed: 04/21/2014 °Elsevier Interactive Patient Education ©2016 [employer] °Moderate Conscious Sedation, Adult, Care After °Refer to this sheet in the next few weeks. These instructions provide you with information on caring for yourself after your procedure. Your health care provider may also give  you more specific instructions. Your treatment has been planned according to current medical practices, but problems sometimes occur. Call your health care provider if you have any problems or questions after your procedure. °WHAT TO EXPECT AFTER THE PROCEDURE  °After your procedure: °· You may feel sleepy, clumsy, and have poor balance for several hours. °· Vomiting may occur if you eat too soon after the procedure. °HOME CARE INSTRUCTIONS °· Do not participate in any activities where you could become injured for at least 24 hours. Do not: °¨ Drive. °¨ Swim. °¨ Ride a bicycle. °¨ Operate heavy machinery. °¨ Cook. °¨ Use power tools. °¨ Climb ladders. °¨ Work from a high place. °· Do not make important decisions or sign legal documents until you are improved. °· If you vomit, drink water, juice, or soup when you can drink without vomiting. Make sure you have little or no nausea before eating solid foods. °· Only take over-the-counter or prescription medicines for pain, discomfort, or fever as directed by your health care provider. °· Make sure you and your family fully understand everything about the medicines given to you, including what side effects may occur. °· You should not drink alcohol, take sleeping pills, or take medicines that cause drowsiness for at least 24 hours. °· If you smoke, do not smoke without supervision. °· If you are feeling better, you may resume normal activities 24 hours after you were sedated. °· Keep all appointments with your health care provider. °SEEK MEDICAL CARE IF: °· Your skin is pale or bluish in color. °· You continue to feel nauseous or vomit. °· Your pain is getting worse and is not helped by medicine. °·   You have bleeding or swelling. °· You are still sleepy or feeling clumsy after 24 hours. °SEEK IMMEDIATE MEDICAL CARE IF: °· You develop a rash. °· You have difficulty breathing. °· You develop any type of allergic problem. °· You have a fever. °MAKE SURE YOU: °· Understand  these instructions. °· Will watch your condition. °· Will get help right away if you are not doing well or get worse. °  °This information is not intended to replace advice given to you by your health care provider. Make sure you discuss any questions you have with your health care provider. °  °Document Released: 02/18/2013 Document Revised: 05/21/2014 Document Reviewed: 02/18/2013 °Elsevier Interactive Patient Education ©2016 Elsevier Inc. ° ° °

## 2015-10-31 ENCOUNTER — Ambulatory Visit: Payer: Medicare Other | Admitting: Cardiology

## 2015-11-04 ENCOUNTER — Ambulatory Visit (INDEPENDENT_AMBULATORY_CARE_PROVIDER_SITE_OTHER): Payer: Medicare Other | Admitting: Cardiology

## 2015-11-04 ENCOUNTER — Encounter: Payer: Self-pay | Admitting: Cardiology

## 2015-11-04 VITALS — BP 108/54 | HR 66 | Ht 64.0 in | Wt 179.0 lb

## 2015-11-04 DIAGNOSIS — I779 Disorder of arteries and arterioles, unspecified: Secondary | ICD-10-CM | POA: Diagnosis not present

## 2015-11-04 DIAGNOSIS — I739 Peripheral vascular disease, unspecified: Secondary | ICD-10-CM

## 2015-11-04 DIAGNOSIS — I2510 Atherosclerotic heart disease of native coronary artery without angina pectoris: Secondary | ICD-10-CM | POA: Diagnosis not present

## 2015-11-04 DIAGNOSIS — I10 Essential (primary) hypertension: Secondary | ICD-10-CM | POA: Diagnosis not present

## 2015-11-04 DIAGNOSIS — I255 Ischemic cardiomyopathy: Secondary | ICD-10-CM

## 2015-11-04 NOTE — Patient Instructions (Addendum)

## 2015-11-04 NOTE — Progress Notes (Signed)
Cardiology Office Note  Date: 11/04/2015   ID: [name], DOB [birth year], MRN [id number]  PCP: Alonza Bogus, MD  Evaluating Cardiologist: Rozann Lesches, MD   Chief Complaint  Patient presents with  . Coronary Artery Disease    History of Present Illness: [name] is an 82 y.o. male former patient of Dr. Aundra Dubin, last seen in December 2016. He is transitioning care to the [place] office. I reviewed his records. He is here today with his wife for a follow-up visit. He does not report any significant angina symptoms and nitroglycerin glycerin use. He has a small garden, otherwise not very active according to his wife.  He is being managed by Dr. Whitney Muse in the oncology center for evaluation of pancytopenia, recently underwent a bone marrow biopsy.  I reviewed his cardiac medications. Current cardiac regimen includes aspirin, Coreg, Cozaar, Aldactone, Zocor, and HCTZ. He has a Medtronic ICD in place, although device reached ERI and was not replaced after discussion with Dr. Lovena Le and findings of LVEF 45-50% by follow-up echocardiogram.  He had recent follow-up carotid Dopplers as outlined below.  Past Medical History  Diagnosis Date  . Coronary artery disease     Multivessel status post CABG 8/06, LIMA-LAD, SVG-CFX, SVG-distal RCA  . Nephrolithiasis   . Chronic systolic heart failure ([place])   . Essential hypertension   . Prostate cancer ([place])   . Hyperlipidemia   . ICD (implantable cardiac defibrillator) in place     Medtronic  . BPH (benign prostatic hypertrophy)   . Ischemic cardiomyopathy     Past Surgical History  Procedure Laterality Date  . Prostate surgery    . Coronary artery bypass graft      8/06 with left anterior descending artery, saphenous vein graft to circumflex, saphenous vein graft to distal right coronary artery  . Colonoscopy    . Polypectomy    . Hernia repair    . Cardiac defibrillator placement    . Colonoscopy N/A 08/19/2013   Procedure: COLONOSCOPY;  Surgeon: Rogene Houston, MD;  [facility]: [facility];  Service: Endoscopy;  Laterality: N/A;  930    Current Outpatient Prescriptions  Medication Sig Dispense Refill  . acetaminophen (TYLENOL) 500 MG tablet Take 1 tablet (500 mg total) by mouth every 6 (six) hours as needed for mild pain or fever. 30 tablet 0  . allopurinol (ZYLOPRIM) 300 MG tablet Take 300 mg by mouth daily.     Marland Kitchen aspirin EC 81 MG tablet Take 162 mg by mouth daily. Take two tablets daily--this will be 154m daily    . carvedilol (COREG) 12.5 MG tablet Take 12.5 mg by mouth 2 (two) times daily with a meal.    . cetirizine (ZYRTEC) 10 MG tablet Take 10 mg by mouth daily as needed for allergies.     . Cholecalciferol (VITAMIN D3) 1000 UNITS CAPS Take 1 capsule by mouth daily.     . cyanocobalamin (,VITAMIN B-12,) 1000 MCG/ML injection Inject 1 mL (1,000 mcg total) into the muscle once. 1 mL 14  . hydrochlorothiazide (HYDRODIURIL) 25 MG tablet take 1/2 tablet by mouth once daily 45 tablet 1  . losartan (COZAAR) 50 MG tablet take 1 and 1/2 tablets by mouth once daily 45 tablet 6  . simvastatin (ZOCOR) 40 MG tablet TAKE ONE TABLET BY MOUTH EVERY EVENING 30 tablet 0  . spironolactone (ALDACTONE) 25 MG tablet Take 0.5 tablets (12.5 mg total) by mouth daily. 15 tablet 6  . vitamin C (  ASCORBIC ACID) 500 MG tablet Take 500 mg by mouth daily.       No current facility-administered medications for this visit.   Allergies:  Codeine   Social History: The patient  reports that he quit smoking about 23 years ago. His smoking use included Cigarettes. He has a 11 pack-year smoking history. He has never used smokeless tobacco. He reports that he does not drink alcohol or use illicit drugs.   Family History: The patient's family history includes Colon cancer in his father.   ROS:  Please see the history of present illness. Otherwise, complete review of systems is positive for arthritic stiffness.  All other systems  are reviewed and negative.   Physical Exam: VS:  BP 108/54 mmHg  Pulse 66  Ht '5\' 4"'  (1.626 m)  Wt 179 lb (81.194 kg)  BMI 30.71 kg/m2  SpO2 96%, BMI Body mass index is 30.71 kg/(m^2).  Wt Readings from Last 3 Encounters:  11/04/15 179 lb (81.194 kg)  10/27/15 179 lb (81.194 kg)  10/14/15 179 lb (81.194 kg)    General: Elderly male, appears comfortable at rest. HEENT: Conjunctiva and lids normal, oropharynx clear. Neck: Supple, no elevated JVP, right crotid bruit, no thyromegaly. Lungs: Clear to auscultation, nonlabored breathing at rest. Thorax: Device pocket site left upper chest. Cardiac: Regular rate and rhythm, no S3, soft systolic murmur, no pericardial rub. Abdomen: Soft, nontender, bowel sounds present, no guarding or rebound. Extremities: Trace ankle edema, distal pulses 2+. Skin: Warm and dry. Musculoskeletal: No kyphosis. Neuropsychiatric: Alert and oriented x3, affect grossly appropriate.  ECG: I personally reviewed the tracing from12/19/2016 which showed sinus rhythm with right bundle branch block and left anterior fascicular block.  Recent Labwork: 07/14/2015: B Natriuretic Peptide 205.0* 09/30/2015: ALT 19; AST 18; BUN 33*; Creatinine, Ser 1.18; Potassium 5.3*; Sodium 136 10/27/2015: Hemoglobin 11.6*; Platelets 104*     Component Value Date/Time   CHOL 122 09/09/2014 1123   TRIG 125.0 09/09/2014 1123   HDL 32.80* 09/09/2014 1123   CHOLHDL 4 09/09/2014 1123   VLDL 25.0 09/09/2014 1123   LDLCALC 64 09/09/2014 1123    Other Studies Reviewed Today:  Dopplers 09/30/2015: IMPRESSION: 1. Bilateral carotid bifurcation and proximal ICA plaque resulting in less than less than 50% ICA stenosis. The exam does not exclude plaque ulceration or embolization. Continued surveillance recommended. 2. Origin stenosis of the left external carotid artery, of uncertain clinical significance. 3. Antegrade bilateral vertebral arterial flow.  Echocardiogram 08/11/2014: Mild LVH  with LVEF 45-50%, basal lateral akinesis, grade 1 diastolic dysfunction, trivial aortic regurgitation, mild mitral regurgitation, moderate left atrial enlargement, PASP 31 mmHg.  Assessment and Plan:  1. Multivessel CAD status post CABG in 2006 as outlined above. Continues to do well without active angina symptoms on medical therapy.  2. Ischemic cardiomyopathy, LVEF 45-50% by last evaluation.  3. Medtronic ICD in place, device met ERI and was not replaced by Dr. Lovena Le after discussion and finding of LVEF 45-50%.  4. Essential hypertension, no changes made present regimen. Dr. Luan Pulling has adjusted medications due to relatively low blood pressure.  5. Moderate carotid artery disease as outlined above, asymptomatic.  Current medicines were reviewed with the patient today.  Disposition: Follow-up with me in 6 months.  Signed, Satira Sark, MD, [facility] 11/04/2015 9:40 AM    [facility] [place]. [address] [place], [place] [address] Phone: [phone]; Fax: [phone]

## 2015-11-08 ENCOUNTER — Encounter (HOSPITAL_COMMUNITY): Payer: Medicare Other

## 2015-11-08 ENCOUNTER — Encounter (HOSPITAL_BASED_OUTPATIENT_CLINIC_OR_DEPARTMENT_OTHER): Payer: Medicare Other | Admitting: Hematology & Oncology

## 2015-11-08 ENCOUNTER — Encounter (HOSPITAL_COMMUNITY): Payer: Self-pay | Admitting: Hematology & Oncology

## 2015-11-08 VITALS — BP 101/48 | HR 67 | Temp 98.1°F | Resp 18 | Wt 181.0 lb

## 2015-11-08 DIAGNOSIS — D61818 Other pancytopenia: Secondary | ICD-10-CM

## 2015-11-08 DIAGNOSIS — N183 Chronic kidney disease, stage 3 (moderate): Secondary | ICD-10-CM

## 2015-11-08 DIAGNOSIS — D46A Refractory cytopenia with multilineage dysplasia: Secondary | ICD-10-CM | POA: Diagnosis not present

## 2015-11-08 DIAGNOSIS — D4620 Refractory anemia with excess of blasts, unspecified: Secondary | ICD-10-CM

## 2015-11-08 DIAGNOSIS — E538 Deficiency of other specified B group vitamins: Secondary | ICD-10-CM | POA: Diagnosis not present

## 2015-11-08 LAB — CBC WITH DIFFERENTIAL/PLATELET
BASOS ABS: 0 10*3/uL (ref 0.0–0.1)
BASOS PCT: 0 %
EOS ABS: 0 10*3/uL (ref 0.0–0.7)
Eosinophils Relative: 0 %
HEMATOCRIT: 34 % — AB (ref 39.0–52.0)
HEMOGLOBIN: 11.4 g/dL — AB (ref 13.0–17.0)
Lymphocytes Relative: 26 %
Lymphs Abs: 1 10*3/uL (ref 0.7–4.0)
MCH: 32 pg (ref 26.0–34.0)
MCHC: 33.5 g/dL (ref 30.0–36.0)
MCV: 95.5 fL (ref 78.0–100.0)
MONOS PCT: 30 %
Monocytes Absolute: 1.1 10*3/uL — ABNORMAL HIGH (ref 0.1–1.0)
NEUTROS ABS: 1.6 10*3/uL — AB (ref 1.7–7.7)
NEUTROS PCT: 44 %
Platelets: 92 10*3/uL — ABNORMAL LOW (ref 150–400)
RBC: 3.56 MIL/uL — AB (ref 4.22–5.81)
RDW: 13.8 % (ref 11.5–15.5)
WBC: 3.7 10*3/uL — AB (ref 4.0–10.5)

## 2015-11-08 LAB — CHROMOSOME ANALYSIS, BONE MARROW

## 2015-11-08 LAB — TISSUE HYBRIDIZATION (BONE MARROW)-NCBH
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
## 2015-11-08 NOTE — Patient Instructions (Addendum)
[place] at [facility] Discharge Instructions  RECOMMENDATIONS MADE BY THE CONSULTANT AND ANY TEST RESULTS WILL BE SENT TO YOUR REFERRING PHYSICIAN.   [place] at [facility] Discharge Instructions  RECOMMENDATIONS MADE BY THE CONSULTANT AND ANY TEST RESULTS WILL BE SENT TO YOUR REFERRING PHYSICIAN.  You were seen by Dr. Whitney Muse today. Return to clinic in 2 months for follow-up appointment and lab work. Call clinic with any questions or concerns.   Thank you for choosing [place] at [facility] to provide your oncology and hematology care.  To afford each patient quality time with our provider, please arrive at least 15 minutes before your scheduled appointment time.   Beginning January 23rd 2017 lab work for the [facility] will be done in the  Main lab at [facility] on 1st floor. If you have a lab appointment with the [facility] please come in thru the  Main Entrance and check in at the main information desk  You need to re-schedule your appointment should you arrive 10 or more minutes late.  We strive to give you quality time with our providers, and arriving late affects you and other patients whose appointments are after yours.  Also, if you no show three or more times for appointments you may be dismissed from the clinic at the providers discretion.     Again, thank you for choosing [facility].  Our hope is that these requests will decrease the amount of time that you wait before being seen by our physicians.       _____________________________________________________________  Should you have questions after your visit to [facility], please contact our office at [phone] between the hours of 8:30 a.m. and 4:30 p.m.  Voicemails left after 4:30 p.m. will not be returned until the following business day.  For prescription refill requests, have your pharmacy contact our office.          Resources For Cancer Patients and their Caregivers ? American Cancer Society: Can assist with transportation, wigs, general needs, runs Look Good Feel Better.        347-345-1170 ? Cancer Care: Provides financial assistance, online support groups, medication/co-pay assistance.  1-800-813-HOPE (331) 033-8244) ? [place] Assists [place] cancer patients and their families through emotional , educational and financial support.  502-613-1129 ? [place] DSS Where to apply for food stamps, Medicaid and utility assistance. 364-290-6017 ? RCATS: Transportation to medical appointments. 620-880-9418 ? Social Security Administration: May apply for disability if have a Stage IV cancer. 2073575758 860-298-7743 ? [employer], Disability and Transit Services: Assists with nutrition, care and transit needs. [place] Support Programs: @10RELATIVEDAYS @ > Cancer Support Group  2nd Tuesday of the month 1pm-2pm, Journey Room  > Creative Journey  3rd Tuesday of the month 1130am-1pm, Journey Room  > Look Good Feel Better  1st Wednesday of the month 10am-12 noon, Journey Room (Call [place] to register [phone])    Thank you for choosing [place] at [facility] to provide your oncology and hematology care.  To afford each patient quality time with our provider, please arrive at least 15 minutes before your scheduled appointment time.   Beginning January 23rd 2017 lab work for the [facility] will be done in the  Main lab at [facility] on 1st floor. If you have a lab appointment with the Cancer  Center please come in thru the  Main Entrance and check in at the main information desk  You need to re-schedule your appointment should you arrive 10 or more minutes late.  We strive to give you quality time with our providers, and arriving late affects you and other patients whose appointments  are after yours.  Also, if you no show three or more times for appointments you may be dismissed from the clinic at the providers discretion.     Again, thank you for choosing [facility].  Our hope is that these requests will decrease the amount of time that you wait before being seen by our physicians.       _____________________________________________________________  Should you have questions after your visit to [facility], please contact our office at [phone] between the hours of 8:30 a.m. and 4:30 p.m.  Voicemails left after 4:30 p.m. will not be returned until the following business day.  For prescription refill requests, have your pharmacy contact our office.         Resources For Cancer Patients and their Caregivers ? American Cancer Society: Can assist with transportation, wigs, general needs, runs Look Good Feel Better.        510-805-1155 ? Cancer Care: Provides financial assistance, online support groups, medication/co-pay assistance.  1-800-813-HOPE 440-213-4700) ? [place] Assists [place] cancer patients and their families through emotional , educational and financial support.  (204)879-9793 ? [place] DSS Where to apply for food stamps, Medicaid and utility assistance. (508)879-7048 ? RCATS: Transportation to medical appointments. 817-301-7711 ? Social Security Administration: May apply for disability if have a Stage IV cancer. 414-467-8044 337-746-9407 ? [employer], Disability and Transit Services: Assists with nutrition, care and transit needs. [place] Support Programs: @10RELATIVEDAYS @ > Cancer Support Group  2nd Tuesday of the month 1pm-2pm, Journey Room  > Creative Journey  3rd Tuesday of the month 1130am-1pm, Journey Room  > Look Good Feel Better  1st Wednesday of the month 10am-12 noon, Journey Room (Call [place] to register [phone])

## 2015-11-14 ENCOUNTER — Encounter (HOSPITAL_COMMUNITY): Payer: Self-pay

## 2015-11-27 ENCOUNTER — Encounter (HOSPITAL_COMMUNITY): Payer: Self-pay | Admitting: Hematology & Oncology

## 2015-11-27 DIAGNOSIS — D4620 Refractory anemia with excess of blasts, unspecified: Secondary | ICD-10-CM | POA: Insufficient documentation

## 2015-11-27 DIAGNOSIS — D46Z Other myelodysplastic syndromes: Secondary | ICD-10-CM

## 2015-11-27 HISTORY — DX: Refractory anemia with excess of blasts, unspecified: D46.20

## 2015-11-27 HISTORY — DX: Other myelodysplastic syndromes: D46.Z

## 2015-11-28 DIAGNOSIS — H6122 Impacted cerumen, left ear: Secondary | ICD-10-CM | POA: Diagnosis not present

## 2016-01-10 ENCOUNTER — Encounter (HOSPITAL_COMMUNITY): Payer: Medicare Other

## 2016-01-10 ENCOUNTER — Encounter (HOSPITAL_COMMUNITY): Payer: Self-pay | Admitting: Hematology & Oncology

## 2016-01-10 ENCOUNTER — Encounter (HOSPITAL_COMMUNITY): Payer: Medicare Other | Attending: Hematology & Oncology | Admitting: Hematology & Oncology

## 2016-01-10 VITALS — BP 125/80 | HR 63 | Temp 97.8°F | Resp 16 | Wt 179.0 lb

## 2016-01-10 DIAGNOSIS — E538 Deficiency of other specified B group vitamins: Secondary | ICD-10-CM | POA: Diagnosis not present

## 2016-01-10 DIAGNOSIS — D4620 Refractory anemia with excess of blasts, unspecified: Secondary | ICD-10-CM

## 2016-01-10 DIAGNOSIS — D708 Other neutropenia: Secondary | ICD-10-CM

## 2016-01-10 DIAGNOSIS — D709 Neutropenia, unspecified: Secondary | ICD-10-CM

## 2016-01-10 DIAGNOSIS — N183 Chronic kidney disease, stage 3 (moderate): Secondary | ICD-10-CM | POA: Diagnosis not present

## 2016-01-10 DIAGNOSIS — D61818 Other pancytopenia: Secondary | ICD-10-CM | POA: Insufficient documentation

## 2016-01-10 DIAGNOSIS — D46A Refractory cytopenia with multilineage dysplasia: Secondary | ICD-10-CM | POA: Diagnosis not present

## 2016-01-10 LAB — CBC WITH DIFFERENTIAL/PLATELET
BASOS ABS: 0 10*3/uL (ref 0.0–0.1)
Basophils Relative: 0 %
EOS ABS: 0 10*3/uL (ref 0.0–0.7)
EOS PCT: 0 %
HCT: 35 % — ABNORMAL LOW (ref 39.0–52.0)
Hemoglobin: 11.7 g/dL — ABNORMAL LOW (ref 13.0–17.0)
LYMPHS ABS: 0.8 10*3/uL (ref 0.7–4.0)
LYMPHS PCT: 33 %
MCH: 32.1 pg (ref 26.0–34.0)
MCHC: 33.4 g/dL (ref 30.0–36.0)
MCV: 96.2 fL (ref 78.0–100.0)
MONO ABS: 0.7 10*3/uL (ref 0.1–1.0)
Monocytes Relative: 30 %
Neutro Abs: 0.8 10*3/uL — ABNORMAL LOW (ref 1.7–7.7)
Neutrophils Relative %: 37 %
Platelets: 76 10*3/uL — ABNORMAL LOW (ref 150–400)
RBC: 3.64 MIL/uL — ABNORMAL LOW (ref 4.22–5.81)
RDW: 13.1 % (ref 11.5–15.5)
WBC: 2.3 10*3/uL — AB (ref 4.0–10.5)

## 2016-01-10 NOTE — Patient Instructions (Addendum)
[place] at [facility] Discharge Instructions  RECOMMENDATIONS MADE BY THE CONSULTANT AND ANY TEST RESULTS WILL BE SENT TO YOUR REFERRING PHYSICIAN.  You saw Dr. Whitney Muse today. Lab work next week. Follow up at cancer center in 1 month. Obtaining insurance approval for neulasta.                         Thank you for choosing [place] at [facility] to provide your oncology and hematology care.  To afford each patient quality time with our provider, please arrive at least 15 minutes before your scheduled appointment time.   Beginning January 23rd 2017 lab work for the [facility] will be done in the  Main lab at [facility] on 1st floor. If you have a lab appointment with the [place] please come in thru the  Main Entrance and check in at the main information desk  You need to re-schedule your appointment should you arrive 10 or more minutes late.  We strive to give you quality time with our providers, and arriving late affects you and other patients whose appointments are after yours.  Also, if you no show three or more times for appointments you may be dismissed from the clinic at the providers discretion.     Again, thank you for choosing [facility].  Our hope is that these requests will decrease the amount of time that you wait before being seen by our physicians.       _____________________________________________________________  Should you have questions after your visit to [facility], please contact our office at [phone] between the hours of 8:30 a.m. and 4:30 p.m.  Voicemails left after 4:30 p.m. will not be returned until the following business day.  For prescription refill requests, have your pharmacy contact our office.         Resources For Cancer Patients and their Caregivers ? American Cancer Society: Can assist with transportation, wigs, general needs, runs Look Good Feel Better.         514-267-9712 ? Cancer Care: Provides financial assistance, online support groups, medication/co-pay assistance.  1-800-813-HOPE 419-107-5982) ? [place] Assists [place] cancer patients and their families through emotional , educational and financial support.  ([phone] ? [place] DSS Where to apply for food stamps, Medicaid and utility assistance. (973)417-2329 ? RCATS: Transportation to medical appointments. 901-560-7755 ? Social Security Administration: May apply for disability if have a Stage IV cancer. 434-574-7809 725-694-0646 ? [employer], Disability and Transit Services: Assists with nutrition, care and transit needs. [place] Support Programs: @10RELATIVEDAYS @ > Cancer Support Group  2nd Tuesday of the month 1pm-2pm, Journey Room  > Creative Journey  3rd Tuesday of the month 1130am-1pm, Journey Room  > Look Good Feel Better  1st Wednesday of the month 10am-12 noon, Journey Room (Call American Cancer [employer] to register [phone])     Pegfilgrastim injection What is this medicine? PEGFILGRASTIM (PEG fil gra stim) is a long-acting granulocyte colony-stimulating factor that stimulates the growth of neutrophils, a type of white blood cell important in the body's fight against infection. It is used to reduce the incidence of fever and infection in patients with certain types of cancer who are receiving chemotherapy that affects the bone marrow, and to increase survival after being exposed to high doses of radiation. This medicine may be used for other purposes; ask your  health care provider or pharmacist if you have questions. What should I tell my health care provider before I take this medicine? They need to know if you have any of these conditions: -kidney disease -latex allergy -ongoing radiation therapy -sickle cell disease -skin reactions to acrylic adhesives (On-Body Injector only) -an unusual or  allergic reaction to pegfilgrastim, filgrastim, other medicines, foods, dyes, or preservatives -pregnant or trying to get pregnant -breast-feeding How should I use this medicine? This medicine is for injection under the skin. If you get this medicine at home, you will be taught how to prepare and give the pre-filled syringe or how to use the On-body Injector. Refer to the patient Instructions for Use for detailed instructions. Use exactly as directed. Take your medicine at regular intervals. Do not take your medicine more often than directed. It is important that you put your used needles and syringes in a special sharps container. Do not put them in a trash can. If you do not have a sharps container, call your pharmacist or healthcare provider to get one. Talk to your pediatrician regarding the use of this medicine in children. While this drug may be prescribed for selected conditions, precautions do apply. Overdosage: If you think you have taken too much of this medicine contact a poison control center or emergency room at once. NOTE: This medicine is only for you. Do not share this medicine with others. What if I miss a dose? It is important not to miss your dose. Call your doctor or health care professional if you miss your dose. If you miss a dose due to an On-body Injector failure or leakage, a new dose should be administered as soon as possible using a single prefilled syringe for manual use. What may interact with this medicine? Interactions have not been studied. Give your health care provider a list of all the medicines, herbs, non-prescription drugs, or dietary supplements you use. Also tell them if you smoke, drink alcohol, or use illegal drugs. Some items may interact with your medicine. This list may not describe all possible interactions. Give your health care provider a list of all the medicines, herbs, non-prescription drugs, or dietary supplements you use. Also tell them if you smoke,  drink alcohol, or use illegal drugs. Some items may interact with your medicine. What should I watch for while using this medicine? You may need blood work done while you are taking this medicine. If you are going to need a MRI, CT scan, or other procedure, tell your doctor that you are using this medicine (On-Body Injector only). What side effects may I notice from receiving this medicine? Side effects that you should report to your doctor or health care professional as soon as possible: -allergic reactions like skin rash, itching or hives, swelling of the face, lips, or tongue -dizziness -fever -pain, redness, or irritation at site where injected -pinpoint red spots on the skin -red or dark-brown urine -shortness of breath or breathing problems -stomach or side pain, or pain at the shoulder -swelling -tiredness -trouble passing urine or change in the amount of urine Side effects that usually do not require medical attention (report to your doctor or health care professional if they continue or are bothersome): -bone pain -muscle pain This list may not describe all possible side effects. Call your doctor for medical advice about side effects. You may report side effects to FDA at 1-800-FDA-1088. Where should I keep my medicine? Keep out of the reach of children. Store pre-filled  syringes in a refrigerator between 2 and 8 degrees C (36 and 46 degrees F). Do not freeze. Keep in carton to protect from light. Throw away this medicine if it is left out of the refrigerator for more than 48 hours. Throw away any unused medicine after the expiration date. NOTE: This sheet is a summary. It may not cover all possible information. If you have questions about this medicine, talk to your doctor, pharmacist, or health care provider.    2016, Elsevier/[employer] Standard. (2014-05-20 14:30:14)

## 2016-01-10 NOTE — Progress Notes (Signed)
[facility] NOTE  Patient Care Team: Sinda Du, MD as PCP - General (Internal Medicine)  CHIEF COMPLAINTS/PURPOSE OF CONSULTATION:  Pancytopenia BMBX suggestive of low grade MDS, normal cytogenetics b12 deficiency  HISTORY OF PRESENTING ILLNESS:  [name] 82 y.o. male is here for further follow-up of MDS and B12 deficiency. Mr. Makovec returns to the [facility] today accompanied by his wife.  Patient accompanied by wife. Labs were reviewed with patient. His hemoglobin is stable. Neutrophil count is about 800 today.   Patient is still taking B12 and feeling well. He is also eating well and has no fever.   Wife states that he has a dry cough, but patient states that he has had a cough for years and it has not gotten worse. It also does not keep him up at night.  Most of the time he states that he sleeps really well. Once in a while he has difficulty sleeping , but usually sleep is normal.  Patient was taking lisinopril, but was told to discontinue it after speaking with Dr. Luan Pulling. He is not currently on any new medications.He took xyzal last night.  Wife states he seldom complains of headache but does mention shoulder pain. She said that he feels better after he puts heat on it. He associated this pain with gardening.    His wife states he has a tendency to develop bronchitis in the winter. No bleeding or bruising. No change in baseline energy.   MEDICAL HISTORY:  Past Medical History:  Diagnosis Date  . BPH (benign prostatic hypertrophy)   . Chronic systolic heart failure ([place])   . Coronary artery disease    Multivessel status post CABG 8/06, LIMA-LAD, SVG-CFX, SVG-distal RCA  . Essential hypertension   . Hyperlipidemia   . ICD (implantable cardiac defibrillator) in place    Medtronic  . Ischemic cardiomyopathy   . Nephrolithiasis   . Prostate cancer [facility])     SURGICAL HISTORY: Past Surgical History:  Procedure  Laterality Date  . CARDIAC DEFIBRILLATOR PLACEMENT    . COLONOSCOPY    . COLONOSCOPY N/A 08/19/2013   Procedure: COLONOSCOPY;  Surgeon: Rogene Houston, MD;  Location: [facility];  Service: Endoscopy;  Laterality: N/A;  930  . CORONARY ARTERY BYPASS GRAFT     8/06 with left anterior descending artery, saphenous vein graft to circumflex, saphenous vein graft to distal right coronary artery  . HERNIA REPAIR    . POLYPECTOMY    . PROSTATE SURGERY      SOCIAL HISTORY: Social History   Social History  . Marital status: Married    Spouse name: N/A  . Number of children: N/A  . Years of education: N/A   Occupational History  . Retired Retired   Social History Main Topics  . Smoking status: Former Smoker    Packs/day: 0.25    Years: 44.00    Types: Cigarettes    Quit date: 05/14/1992  . Smokeless tobacco: Never Used  . Alcohol use No  . Drug use: No  . Sexual activity: Not on file     Comment: married 63 years   Other Topics Concern  . Not on file   Social History Narrative  . No narrative on file   Married 63 years on June 16th. He says "she chased me for 7 years and I said "that's it, I'm tired.'" 2 sons; 3 grandchildren, all boys. 2 great-grandchildren, all boys. Another one on  the way that's a boy. All of his siblings have girls. He was a "TV person." Fixed them, sold them; etc. Repair, sales. Born in [place]. Smoked for 40 years; quit in '94. He had a heart attack and quit. Denies any problems with alcohol, saying "no, I don't drink." Used to fish as a hobby, in both salt and freshwater; saltwater mostly.  FAMILY HISTORY: Family History  Problem Relation Age of Onset  . Colon cancer Father   . Coronary artery disease     His mother died when he was 73, and his sister was 39 months old. Mother died of meningitis. Father lived to be 20, then died in 76 of colon cancer. 3 brothers; 1 sister. 1 half-brother, and 2 half-sisters. 2 oldest are passed. He doesn't know of  any brothers having prostate cancer or bladder/kidney problems. One of his brothers had cancer of the lung. His father remarried when he was 64.  ALLERGIES:  is allergic to codeine.  MEDICATIONS:  Current Outpatient Prescriptions  Medication Sig Dispense Refill  . acetaminophen (TYLENOL) 500 MG tablet Take 1 tablet (500 mg total) by mouth every 6 (six) hours as needed for mild pain or fever. 30 tablet 0  . allopurinol (ZYLOPRIM) 300 MG tablet Take 300 mg by mouth daily.     Marland Kitchen aspirin EC 81 MG tablet Take 162 mg by mouth daily. Take two tablets daily--this will be 162mg  daily    . carvedilol (COREG) 12.5 MG tablet Take 12.5 mg by mouth 2 (two) times daily with a meal.    . cetirizine (ZYRTEC) 10 MG tablet Take 10 mg by mouth daily as needed for allergies.     . Cholecalciferol (VITAMIN D3) 1000 UNITS CAPS Take 1 capsule by mouth daily.     . cyanocobalamin (,VITAMIN B-12,) 1000 MCG/ML injection Inject 1 mL (1,000 mcg total) into the muscle once. 1 mL 14  . hydrochlorothiazide (HYDRODIURIL) 25 MG tablet take 1/2 tablet by mouth once daily 45 tablet 1  . losartan (COZAAR) 50 MG tablet take 1 and 1/2 tablets by mouth once daily 45 tablet 6  . simvastatin (ZOCOR) 40 MG tablet TAKE ONE TABLET BY MOUTH EVERY EVENING 30 tablet 0  . spironolactone (ALDACTONE) 25 MG tablet Take 0.5 tablets (12.5 mg total) by mouth daily. 15 tablet 6  . vitamin C (ASCORBIC ACID) 500 MG tablet Take 500 mg by mouth daily.       No current facility-administered medications for this visit.     Review of Systems  Constitutional: Negative.  Negative for chills, fever, malaise/fatigue and weight loss.  HENT: Negative.  Negative for congestion, hearing loss, nosebleeds, sore throat and tinnitus.   Eyes: Negative.  Negative for blurred vision, double vision, pain and discharge.  Respiratory: Positive for cough. Negative for hemoptysis, sputum production, shortness of breath and wheezing.        Dry cough he has had for  years; unchanged   Cardiovascular: Negative.  Negative for chest pain, palpitations, claudication, leg swelling and PND.  Gastrointestinal: Negative.  Negative for abdominal pain, blood in stool, constipation, diarrhea, heartburn, melena, nausea and vomiting.  Genitourinary: Negative.  Negative for dysuria, frequency, hematuria and urgency.  Musculoskeletal: Positive for joint pain. Negative for falls and myalgias.       Shoulder pain  Skin: Negative.  Negative for itching and rash.  Neurological: Negative.  Negative for dizziness, tingling, tremors, sensory change, speech change, focal weakness, seizures, loss of consciousness, weakness and headaches.  Endo/Heme/Allergies:  Negative.  Does not bruise/bleed easily.  Psychiatric/Behavioral: Negative.  Negative for depression, memory loss, substance abuse and suicidal ideas. The patient is not nervous/anxious and does not have insomnia.   All other systems reviewed and are negative.  14 point ROS was done and is otherwise as detailed above or in HPI    PHYSICAL EXAMINATION: ECOG PERFORMANCE STATUS: 1 - Symptomatic but completely ambulatory  Vitals:   01/10/16 1117  BP: 125/80  Pulse: 63  Resp: 16  Temp: 97.8 F (36.6 C)   Filed Weights   01/10/16 1117  Weight: 179 lb (81.2 kg)    Physical Exam  Constitutional: He is oriented to person, place, and time and well-developed, well-nourished, and in no distress.  Appear somewhat younger than stated age   HENT:  Head: Normocephalic and atraumatic.  Nose: Nose normal.  Mouth/Throat: Oropharynx is clear and moist. No oropharyngeal exudate.  Eyes: Conjunctivae and EOM are normal. Pupils are equal, round, and reactive to light. Right eye exhibits no discharge. Left eye exhibits no discharge. No scleral icterus.  Neck: Normal range of motion. Neck supple. No tracheal deviation present. No thyromegaly present.  Cardiovascular: Normal rate, regular rhythm and normal heart sounds.  Exam  reveals no gallop and no friction rub.   No murmur heard. Palpable pacer/defibrillator  Pulmonary/Chest: Effort normal and breath sounds normal. He has no wheezes. He has no rales.  Abdominal: Soft. Bowel sounds are normal. He exhibits no distension and no mass. There is no tenderness. There is no rebound and no guarding.  Musculoskeletal: Normal range of motion. He exhibits no edema.  Lymphadenopathy:    He has no cervical adenopathy.  Neurological: He is alert and oriented to person, place, and time. He has normal reflexes. No cranial nerve deficit. Gait normal. Coordination normal.  Skin: Skin is warm and dry. No rash noted.  Psychiatric: Mood, memory, affect and judgment normal.  Nursing note and vitals reviewed.   LABORATORY DATA:  I have reviewed the data as listed Lab Results  Component Value Date   WBC 2.3 (L) 01/10/2016   HGB 11.7 (L) 01/10/2016   HCT 35.0 (L) 01/10/2016   MCV 96.2 01/10/2016   PLT 76 (L) 01/10/2016   CMP     Component Value Date/Time   NA 136 09/30/2015 1500   K 5.3 (H) 09/30/2015 1500   CL 105 09/30/2015 1500   CO2 25 09/30/2015 1500   GLUCOSE 108 (H) 09/30/2015 1500   BUN 33 (H) 09/30/2015 1500   CREATININE 1.18 09/30/2015 1500   CREATININE 1.43 (H) 07/24/2013 1623   CALCIUM 9.2 09/30/2015 1500   PROT 7.4 09/30/2015 1500   ALBUMIN 4.4 09/30/2015 1500   AST 18 09/30/2015 1500   ALT 19 09/30/2015 1500   ALKPHOS 48 09/30/2015 1500   BILITOT 0.8 09/30/2015 1500   GFRNONAA 54 (L) 09/30/2015 1500   GFRAA >60 09/30/2015 1500    Results for LINDBURGH, BLUEMEL (MRN [id number])   Ref. Range 07/14/2015 20:15 09/30/2015 15:00 11/08/2015 11:30 01/10/2016 10:40  NEUT# Latest Ref Range: 1.7 - 7.7 K/uL 3.5 1.3 (L) 1.6 (L) 0.8 (L)    Results for GUMARO, KULIKOWSKI (MRN [id number]) as of 10/14/2015 11:48  Ref. Range 09/30/2015 15:00  Total Protein ELP Latest Ref Range: 6.0-8.5 g/dL 6.9  IgG (Immunoglobin G), Serum Latest Ref Range: 252-832-9262 mg/dL 972  IgA Latest Ref  Range: 61-437 mg/dL 104  IgM, Serum Latest Ref Range: 15-143 mg/dL 116  Immunofixation Result, Serum Unknown Comment  NORMAL CYTOGENETICS  RADIOGRAPHIC STUDIES: I have personally reviewed the radiological images as listed and agreed with the findings in the report. No results found.  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 He has no fever or infectious complaints. It is unclear that the treatment of neutropenia without a history of infection is of benefit. Studies of granulocyte colony stimulating factor (G-CSF) or granulocyte macrophage colony stimulating factor (GM-CSF) in this setting have demonstrated that these agents can increase neutrophil counts, but do not improve clinically significant endpoints such as infection rates and survival  We discussed close observation, we reviewed neutropenia and fever in detail today.  I will repeat a CBC in one week.   Follow up in a month and if WBC continues to remain low, we will discuss other treatment options.  All questions were answered. The patient knows to call the clinic with any problems, questions or concerns.  This document serves as a record of services personally performed by Ancil Linsey, MD. It was created on her behalf by Elmyra Ricks, a trained medical scribe. The creation of this record is based on the scribe's personal observations and the provider's statements to them. This document has been checked and approved by the attending provider..  I have reviewed the above documentation for accuracy and completeness and I agree with the above.  This note was electronically signed.  Molli Hazard, MD  01/10/2016 11:51 AM

## 2016-01-12 ENCOUNTER — Other Ambulatory Visit (HOSPITAL_COMMUNITY): Payer: Self-pay | Admitting: *Deleted

## 2016-01-12 DIAGNOSIS — C61 Malignant neoplasm of prostate: Secondary | ICD-10-CM

## 2016-01-12 LAB — ZINC: ZINC: 72 ug/dL (ref 56–134)

## 2016-01-12 LAB — COPPER, SERUM: COPPER: 95 ug/dL (ref 72–166)

## 2016-01-13 ENCOUNTER — Other Ambulatory Visit: Payer: Self-pay

## 2016-01-15 ENCOUNTER — Encounter (HOSPITAL_COMMUNITY): Payer: Self-pay | Admitting: Hematology & Oncology

## 2016-01-17 ENCOUNTER — Encounter (HOSPITAL_COMMUNITY): Payer: Medicare Other | Attending: Hematology & Oncology

## 2016-01-17 ENCOUNTER — Telehealth (HOSPITAL_COMMUNITY): Payer: Self-pay | Admitting: *Deleted

## 2016-01-17 DIAGNOSIS — D708 Other neutropenia: Secondary | ICD-10-CM | POA: Insufficient documentation

## 2016-01-17 DIAGNOSIS — C61 Malignant neoplasm of prostate: Secondary | ICD-10-CM | POA: Insufficient documentation

## 2016-01-17 DIAGNOSIS — D4620 Refractory anemia with excess of blasts, unspecified: Secondary | ICD-10-CM | POA: Diagnosis not present

## 2016-01-17 LAB — CBC WITH DIFFERENTIAL/PLATELET
Basophils Absolute: 0 10*3/uL (ref 0.0–0.1)
Basophils Relative: 0 %
Eosinophils Absolute: 0 10*3/uL (ref 0.0–0.7)
Eosinophils Relative: 0 %
HEMATOCRIT: 33.6 % — AB (ref 39.0–52.0)
HEMOGLOBIN: 11.2 g/dL — AB (ref 13.0–17.0)
LYMPHS ABS: 1 10*3/uL (ref 0.7–4.0)
Lymphocytes Relative: 37 %
MCH: 31.9 pg (ref 26.0–34.0)
MCHC: 33.3 g/dL (ref 30.0–36.0)
MCV: 95.7 fL (ref 78.0–100.0)
MONOS PCT: 25 %
Monocytes Absolute: 0.7 10*3/uL (ref 0.1–1.0)
NEUTROS ABS: 1 10*3/uL — AB (ref 1.7–7.7)
NEUTROS PCT: 37 %
Platelets: 83 10*3/uL — ABNORMAL LOW (ref 150–400)
RBC: 3.51 MIL/uL — ABNORMAL LOW (ref 4.22–5.81)
RDW: 13.2 % (ref 11.5–15.5)
WBC: 2.6 10*3/uL — ABNORMAL LOW (ref 4.0–10.5)

## 2016-01-17 NOTE — Telephone Encounter (Signed)
Pt aware that labs are stable.  

## 2016-01-17 NOTE — Telephone Encounter (Signed)
-----   Message from Baird Cancer, PA-C sent at 01/17/2016  4:35 PM EDT ----- Stable

## 2016-01-23 DIAGNOSIS — I25119 Atherosclerotic heart disease of native coronary artery with unspecified angina pectoris: Secondary | ICD-10-CM | POA: Diagnosis not present

## 2016-01-23 DIAGNOSIS — I110 Hypertensive heart disease with heart failure: Secondary | ICD-10-CM | POA: Diagnosis not present

## 2016-01-23 DIAGNOSIS — I951 Orthostatic hypotension: Secondary | ICD-10-CM | POA: Diagnosis not present

## 2016-01-31 DIAGNOSIS — L570 Actinic keratosis: Secondary | ICD-10-CM | POA: Diagnosis not present

## 2016-01-31 DIAGNOSIS — D485 Neoplasm of uncertain behavior of skin: Secondary | ICD-10-CM | POA: Diagnosis not present

## 2016-01-31 DIAGNOSIS — C44319 Basal cell carcinoma of skin of other parts of face: Secondary | ICD-10-CM | POA: Diagnosis not present

## 2016-01-31 DIAGNOSIS — Z85828 Personal history of other malignant neoplasm of skin: Secondary | ICD-10-CM | POA: Diagnosis not present

## 2016-02-08 NOTE — Progress Notes (Signed)
[name] NOTE  Patient Care Team: Sinda Du, MD as PCP - General (Internal Medicine)  CHIEF COMPLAINTS/PURPOSE OF CONSULTATION:  Pancytopenia BMBX suggestive of low grade MDS, normal cytogenetics b12 deficiency  HISTORY OF PRESENTING ILLNESS:  [name] 82 y.o. male is here for further follow-up of MDS and B12 deficiency.  [name] is accompanied by his wife. I personally reviewed and went over laboratory studies with the patient.  He feels fine. He denies fevers or abdominal pain. His wife reports he has shortness of breath with exertion, after working in his garden. This is unchanged.  He has recently had a few skin cancers removed on his face. This was done by Dr. Tarri Glenn in [place].  He plans to get a flu shot at the end of October.  Denies new cough, change in appetite or baseline energy. No new bruising. No nosebleeds. No hematuria or BRBPR.    MEDICAL HISTORY:  Past Medical History:  Diagnosis Date  . BPH (benign prostatic hypertrophy)   . Chronic systolic heart failure ([place])   . Coronary artery disease    Multivessel status post CABG 8/06, LIMA-LAD, SVG-CFX, SVG-distal RCA  . Essential hypertension   . Hyperlipidemia   . ICD (implantable cardiac defibrillator) in place    [employer]tronic  . Ischemic cardiomyopathy   . Nephrolithiasis   . Prostate cancer [facility])     SURGICAL HISTORY: Past Surgical History:  Procedure Laterality Date  . CARDIAC DEFIBRILLATOR PLACEMENT    . COLONOSCOPY    . COLONOSCOPY N/A 08/19/2013   Procedure: COLONOSCOPY;  Surgeon: Rogene Houston, MD;  Location[facility];  Service: Endoscopy;  Laterality: N/A;  930  . CORONARY ARTERY BYPASS GRAFT     8/06 with left anterior descending artery, saphenous vein graft to circumflex, saphenous vein graft to distal right coronary artery  . HERNIA REPAIR    . POLYPECTOMY    . PROSTATE SURGERY      SOCIAL HISTORY: Social History   Social  History  . Marital status: Married    Spouse name: N/A  . Number of children: N/A  . Years of education: N/A   Occupational History  . Retired Retired   Social History Main Topics  . Smoking status: Former Smoker    Packs/day: 0.25    Years: 44.00    Types: Cigarettes    Quit date: 05/14/1992  . Smokeless tobacco: Never Used  . Alcohol use No  . Drug use: No  . Sexual activity: Not on file     Comment: married 63 years   Other Topics Concern  . Not on file   Social History Narrative  . No narrative on file   Married 63 years on June 16th. He says "she chased me for 7 years and I said "that's it, I'm tired.'" 2 sons; 3 grandchildren, all boys. 2 great-grandchildren, all boys. Another one on the way that's a boy. All of his siblings have girls. He was a "TV person." Fixed them, sold them; etc. Repair, sales. Born in [place]. Smoked for 40 years; quit in '94. He had a heart attack and quit. Denies any problems with alcohol, saying "no, I don't drink." Used to fish as a hobby, in both salt and freshwater; saltwater mostly.  FAMILY HISTORY: Family History  Problem Relation Age of Onset  . Colon cancer Father   . Coronary artery disease     His mother died when he was 24,  and his sister was 39 months old. Mother died of meningitis. Father lived to be 33, then died in 72 of colon cancer. 3 brothers; 1 sister. 1 half-brother, and 2 half-sisters. 2 oldest are passed. He doesn't know of any brothers having prostate cancer or bladder/kidney problems. One of his brothers had cancer of the lung. His father remarried when he was 25.  ALLERGIES:  is allergic to codeine.  MEDICATIONS:  Current Outpatient Prescriptions  Medication Sig Dispense Refill  . acetaminophen (TYLENOL) 500 MG tablet Take 1 tablet (500 mg total) by mouth every 6 (six) hours as needed for mild pain or fever. 30 tablet 0  . allopurinol (ZYLOPRIM) 300 MG tablet Take 300 mg by mouth daily.     Marland Kitchen aspirin EC 81 MG  tablet Take 162 mg by mouth daily. Take two tablets daily--this will be 162mg  daily    . carvedilol (COREG) 12.5 MG tablet Take 12.5 mg by mouth 2 (two) times daily with a meal.    . cetirizine (ZYRTEC) 10 MG tablet Take 10 mg by mouth daily as needed for allergies.     . Cholecalciferol (VITAMIN D3) 1000 UNITS CAPS Take 1 capsule by mouth daily.     . cyanocobalamin (,VITAMIN B-12,) 1000 MCG/ML injection Inject 1 mL (1,000 mcg total) into the muscle once. 1 mL 14  . hydrochlorothiazide (HYDRODIURIL) 25 MG tablet take 1/2 tablet by mouth once daily 45 tablet 1  . losartan (COZAAR) 50 MG tablet take 1 and 1/2 tablets by mouth once daily 45 tablet 6  . simvastatin (ZOCOR) 40 MG tablet TAKE ONE TABLET BY MOUTH EVERY EVENING 30 tablet 0  . spironolactone (ALDACTONE) 25 MG tablet Take 0.5 tablets (12.5 mg total) by mouth daily. 15 tablet 6  . vitamin C (ASCORBIC ACID) 500 MG tablet Take 500 mg by mouth daily.       No current facility-administered medications for this visit.     Review of Systems  Constitutional: Negative.  Negative for chills, fever, malaise/fatigue and weight loss.  HENT: Negative.  Negative for congestion, hearing loss, nosebleeds, sore throat and tinnitus.   Eyes: Negative.  Negative for blurred vision, double vision, pain and discharge.  Respiratory: Positive for shortness of breath. Negative for cough, hemoptysis, sputum production and wheezing.        SOB with exertion  Cardiovascular: Negative.  Negative for chest pain, palpitations, claudication, leg swelling and PND.  Gastrointestinal: Negative.  Negative for abdominal pain, blood in stool, constipation, diarrhea, heartburn, melena, nausea and vomiting.  Genitourinary: Negative.  Negative for dysuria, frequency, hematuria and urgency.  Musculoskeletal: Positive for joint pain. Negative for falls and myalgias.       Shoulder pain  Skin: Negative.  Negative for itching and rash.  Neurological: Negative.  Negative for  dizziness, tingling, tremors, sensory change, speech change, focal weakness, seizures, loss of consciousness, weakness and headaches.  Endo/Heme/Allergies: Negative.  Does not bruise/bleed easily.  Psychiatric/Behavioral: Negative.  Negative for depression, memory loss, substance abuse and suicidal ideas. The patient is not nervous/anxious and does not have insomnia.   All other systems reviewed and are negative. 14 point ROS was done and is otherwise as detailed above or in HPI    PHYSICAL EXAMINATION: ECOG PERFORMANCE STATUS: 1 - Symptomatic but completely ambulatory  Vitals:   02/10/16 0918  BP: (!) 126/52  Pulse: 61  Resp: 16  Temp: 97.9 F (36.6 C)   Filed Weights   02/10/16 0918  Weight: 181 lb 9.6  oz (82.4 kg)    Physical Exam  Constitutional: He is oriented to person, place, and time and well-developed, well-nourished, and in no distress.  Appear somewhat younger than stated age  HENT:  Head: Normocephalic and atraumatic.  Nose: Nose normal.  Mouth/Throat: Oropharynx is clear and moist. No oropharyngeal exudate.  Eyes: Conjunctivae and EOM are normal. Pupils are equal, round, and reactive to light. Right eye exhibits no discharge. Left eye exhibits no discharge. No scleral icterus.  Neck: Normal range of motion. Neck supple. No tracheal deviation present. No thyromegaly present.  Cardiovascular: Normal rate, regular rhythm and normal heart sounds.  Exam reveals no gallop and no friction rub.   No murmur heard. Palpable pacer/defibrillator  Pulmonary/Chest: Effort normal and breath sounds normal. He has no wheezes. He has no rales.  Abdominal: Soft. Bowel sounds are normal. He exhibits no distension and no mass. There is no tenderness. There is no rebound and no guarding.  Musculoskeletal: Normal range of motion. He exhibits no edema.  Lymphadenopathy:    He has no cervical adenopathy.  Neurological: He is alert and oriented to person, place, and time. He has normal  reflexes. No cranial nerve deficit. Gait normal. Coordination normal.  Skin: Skin is warm and dry. No rash noted.  Psychiatric: Mood, memory, affect and judgment normal.  Nursing note and vitals reviewed.   LABORATORY DATA:  I have reviewed the data as listed Lab Results  Component Value Date   WBC 2.6 (L) 01/17/2016   HGB 11.2 (L) 01/17/2016   HCT 33.6 (L) 01/17/2016   MCV 95.7 01/17/2016   PLT 83 (L) 01/17/2016   CMP     Component Value Date/Time   NA 136 09/30/2015 1500   K 5.3 (H) 09/30/2015 1500   CL 105 09/30/2015 1500   CO2 25 09/30/2015 1500   GLUCOSE 108 (H) 09/30/2015 1500   BUN 33 (H) 09/30/2015 1500   CREATININE 1.18 09/30/2015 1500   CREATININE 1.43 (H) 07/24/2013 1623   CALCIUM 9.2 09/30/2015 1500   PROT 7.4 09/30/2015 1500   ALBUMIN 4.4 09/30/2015 1500   AST 18 09/30/2015 1500   ALT 19 09/30/2015 1500   ALKPHOS 48 09/30/2015 1500   BILITOT 0.8 09/30/2015 1500   GFRNONAA 54 (L) 09/30/2015 1500   GFRAA >60 09/30/2015 1500   Results for LAYTHEN, MCKINNY (MRN [id number])   Ref. Range 11/08/2015 11:30 01/10/2016 10:40 01/17/2016 14:07  NEUT# Latest Ref Range: 1.7 - 7.7 K/uL 1.6 (L) 0.8 (L) 1.0 (L)    Results for YOSHIO, BRADLE (MRN [id number])   Ref. Range 09/30/2015 15:00  Total Protein ELP Latest Ref Range: 6.0-8.5 g/dL 6.9  IgG (Immunoglobin G), Serum Latest Ref Range: [phone] mg/dL 972  IgA Latest Ref Range: 61-437 mg/dL 104  IgM, Serum Latest Ref Range: 15-143 mg/dL 116  Immunofixation Result, Serum Unknown Comment                NORMAL CYTOGENETICS  RADIOGRAPHIC STUDIES: I have personally reviewed the radiological images as listed and agreed with the findings in the report. No results found.  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 He has no fever or infectious complaints. No new  bleeding or bruising.  It is unclear that the treatment of neutropenia without a history of infection is of benefit. Studies of granulocyte colony stimulating factor (G-CSF) or granulocyte macrophage colony stimulating factor (  GM-CSF) in this setting have demonstrated that these agents can increase neutrophil counts, but do not improve clinically significant endpoints such as infection rates and survival. Platelet count is adequate and he has not had a change in baseline bruising/bleeding.   Labs reviewed. Based on neutrophil and platelet counts, he will continue with monthly labs.   He understands to contact our office if he develops fevers, poor appetite, nosebleeds, or abnormal bruising.  He will return for labs in one month and return for follow up in 8 weeks.   He is to continue with monthly B12 IM which is self administered at home.  All questions were answered. The patient knows to call the clinic with any problems, questions or concerns.  This document serves as a record of services personally performed by Ancil Linsey, MD. It was created on her behalf by Arlyce Harman, a trained medical scribe. The creation of this record is based on the scribe's personal observations and the provider's statements to them. This document has been checked and approved by the attending provider.  I have reviewed the above documentation for accuracy and completeness and I agree with the above.  This note was electronically signed.  Molli Hazard, MD  02/11/2016 1:16 AM

## 2016-02-10 ENCOUNTER — Encounter (HOSPITAL_COMMUNITY): Payer: Self-pay | Admitting: Hematology & Oncology

## 2016-02-10 ENCOUNTER — Encounter (HOSPITAL_BASED_OUTPATIENT_CLINIC_OR_DEPARTMENT_OTHER): Payer: Medicare Other | Admitting: Hematology & Oncology

## 2016-02-10 VITALS — BP 126/52 | HR 61 | Temp 97.9°F | Resp 16 | Wt 181.6 lb

## 2016-02-10 DIAGNOSIS — D696 Thrombocytopenia, unspecified: Secondary | ICD-10-CM

## 2016-02-10 DIAGNOSIS — D4620 Refractory anemia with excess of blasts, unspecified: Secondary | ICD-10-CM

## 2016-02-10 DIAGNOSIS — D46A Refractory cytopenia with multilineage dysplasia: Secondary | ICD-10-CM | POA: Diagnosis not present

## 2016-02-10 DIAGNOSIS — D61818 Other pancytopenia: Secondary | ICD-10-CM | POA: Diagnosis not present

## 2016-02-10 DIAGNOSIS — D708 Other neutropenia: Secondary | ICD-10-CM

## 2016-02-10 DIAGNOSIS — E538 Deficiency of other specified B group vitamins: Secondary | ICD-10-CM

## 2016-02-10 DIAGNOSIS — N183 Chronic kidney disease, stage 3 (moderate): Secondary | ICD-10-CM | POA: Diagnosis not present

## 2016-02-10 NOTE — Patient Instructions (Addendum)
[place] at [facility] Discharge Instructions  RECOMMENDATIONS MADE BY THE CONSULTANT AND ANY TEST RESULTS WILL BE SENT TO YOUR REFERRING PHYSICIAN.  You saw Dr. Whitney Muse today. Labs monthly. Follow up in 8 weeks.  Thank you for choosing [place] at [facility] to provide your oncology and hematology care.  To afford each patient quality time with our provider, please arrive at least 15 minutes before your scheduled appointment time.   Beginning January 23rd 2017 lab work for the [facility] will be done in the  Main lab at [facility] on 1st floor. If you have a lab appointment with the [facility] please come in thru the  Main Entrance and check in at the main information desk  You need to re-schedule your appointment should you arrive 10 or more minutes late.  We strive to give you quality time with our providers, and arriving late affects you and other patients whose appointments are after yours.  Also, if you no show three or more times for appointments you may be dismissed from the clinic at the providers discretion.     Again, thank you for choosing [facility].  Our hope is that these requests will decrease the amount of time that you wait before being seen by our physicians.       _____________________________________________________________  Should you have questions after your visit to [facility], please contact our office at [phone] between the hours of 8:30 a.m. and 4:30 p.m.  Voicemails left after 4:30 p.m. will not be returned until the following business day.  For prescription refill requests, have your pharmacy contact our office.         Resources For Cancer Patients and their Caregivers ? American Cancer Society: Can assist with transportation, wigs, general needs, runs Look Good Feel Better.        628-335-7251 ? Cancer Care: Provides financial assistance, online support  groups, medication/co-pay assistance.  1-800-813-HOPE (709) 201-6248) ? [place] Assists [place] cancer patients and their families through emotional , educational and financial support.  831-781-8492 ? [place] DSS Where to apply for food stamps, Medicaid and utility assistance. 480-079-2231 ? RCATS: Transportation to medical appointments. (978)818-1019 ? Social Security Administration: May apply for disability if have a Stage IV cancer. (870) 274-4067 431-618-0468 ? [employer], Disability and Transit Services: Assists with nutrition, care and transit needs. [place] Support Programs: @10RELATIVEDAYS @ > Cancer Support Group  2nd Tuesday of the month 1pm-2pm, Journey Room  > Creative Journey  3rd Tuesday of the month 1130am-1pm, Journey Room  > Look Good Feel Better  1st Wednesday of the month 10am-12 noon, Journey Room (Call [place] to register [phone])

## 2016-02-11 ENCOUNTER — Encounter (HOSPITAL_COMMUNITY): Payer: Self-pay | Admitting: Hematology & Oncology

## 2016-02-16 DIAGNOSIS — C44319 Basal cell carcinoma of skin of other parts of face: Secondary | ICD-10-CM | POA: Diagnosis not present

## 2016-03-01 DIAGNOSIS — Z23 Encounter for immunization: Secondary | ICD-10-CM | POA: Diagnosis not present

## 2016-03-09 ENCOUNTER — Encounter (HOSPITAL_COMMUNITY): Payer: Medicare Other | Attending: Hematology & Oncology

## 2016-03-09 DIAGNOSIS — D4620 Refractory anemia with excess of blasts, unspecified: Secondary | ICD-10-CM | POA: Diagnosis not present

## 2016-03-09 DIAGNOSIS — D708 Other neutropenia: Secondary | ICD-10-CM | POA: Insufficient documentation

## 2016-03-09 DIAGNOSIS — C61 Malignant neoplasm of prostate: Secondary | ICD-10-CM | POA: Diagnosis not present

## 2016-03-09 LAB — CBC WITH DIFFERENTIAL/PLATELET
BASOS ABS: 0 10*3/uL (ref 0.0–0.1)
BASOS PCT: 0 %
EOS ABS: 0 10*3/uL (ref 0.0–0.7)
EOS PCT: 0 %
HCT: 36.3 % — ABNORMAL LOW (ref 39.0–52.0)
Hemoglobin: 11.9 g/dL — ABNORMAL LOW (ref 13.0–17.0)
Lymphocytes Relative: 31 %
Lymphs Abs: 0.9 10*3/uL (ref 0.7–4.0)
MCH: 31.2 pg (ref 26.0–34.0)
MCHC: 32.8 g/dL (ref 30.0–36.0)
MCV: 95.3 fL (ref 78.0–100.0)
Monocytes Absolute: 0.7 10*3/uL (ref 0.1–1.0)
Monocytes Relative: 25 %
Neutro Abs: 1.3 10*3/uL — ABNORMAL LOW (ref 1.7–7.7)
Neutrophils Relative %: 44 %
RBC: 3.81 MIL/uL — AB (ref 4.22–5.81)
RDW: 13.3 % (ref 11.5–15.5)
WBC: 3 10*3/uL — AB (ref 4.0–10.5)

## 2016-03-24 ENCOUNTER — Other Ambulatory Visit: Payer: Self-pay | Admitting: Cardiology

## 2016-03-26 NOTE — Telephone Encounter (Signed)
Pt saw Dr. Domenic Polite, last. Please advise

## 2016-04-10 ENCOUNTER — Encounter (HOSPITAL_COMMUNITY): Payer: Medicare Other

## 2016-04-10 ENCOUNTER — Encounter (HOSPITAL_COMMUNITY): Payer: Medicare Other | Attending: Hematology & Oncology | Admitting: Hematology & Oncology

## 2016-04-10 ENCOUNTER — Encounter (HOSPITAL_COMMUNITY): Payer: Self-pay | Admitting: Hematology & Oncology

## 2016-04-10 VITALS — BP 121/65 | HR 61 | Temp 97.7°F | Resp 18 | Wt 181.8 lb

## 2016-04-10 DIAGNOSIS — D696 Thrombocytopenia, unspecified: Secondary | ICD-10-CM

## 2016-04-10 DIAGNOSIS — N183 Chronic kidney disease, stage 3 (moderate): Secondary | ICD-10-CM

## 2016-04-10 DIAGNOSIS — D469 Myelodysplastic syndrome, unspecified: Secondary | ICD-10-CM | POA: Diagnosis not present

## 2016-04-10 DIAGNOSIS — D708 Other neutropenia: Secondary | ICD-10-CM | POA: Diagnosis not present

## 2016-04-10 DIAGNOSIS — D61818 Other pancytopenia: Secondary | ICD-10-CM

## 2016-04-10 DIAGNOSIS — E538 Deficiency of other specified B group vitamins: Secondary | ICD-10-CM

## 2016-04-10 DIAGNOSIS — D4620 Refractory anemia with excess of blasts, unspecified: Secondary | ICD-10-CM

## 2016-04-10 LAB — CBC WITH DIFFERENTIAL/PLATELET
BASOS ABS: 0 10*3/uL (ref 0.0–0.1)
Basophils Relative: 0 %
Eosinophils Absolute: 0 10*3/uL (ref 0.0–0.7)
Eosinophils Relative: 0 %
HEMATOCRIT: 35.3 % — AB (ref 39.0–52.0)
HEMOGLOBIN: 11.6 g/dL — AB (ref 13.0–17.0)
LYMPHS ABS: 1 10*3/uL (ref 0.7–4.0)
LYMPHS PCT: 32 %
MCH: 31.4 pg (ref 26.0–34.0)
MCHC: 32.9 g/dL (ref 30.0–36.0)
MCV: 95.7 fL (ref 78.0–100.0)
Monocytes Absolute: 1 10*3/uL (ref 0.1–1.0)
Monocytes Relative: 32 %
NEUTROS ABS: 1.2 10*3/uL — AB (ref 1.7–7.7)
NEUTROS PCT: 36 %
Platelets: 78 10*3/uL — ABNORMAL LOW (ref 150–400)
RBC: 3.69 MIL/uL — AB (ref 4.22–5.81)
RDW: 13.3 % (ref 11.5–15.5)
WBC: 3.2 10*3/uL — AB (ref 4.0–10.5)

## 2016-04-10 NOTE — Progress Notes (Signed)
Per Dr.  Muse, patient is fine to start taking oral B12 in place of the B12 injections that his wife has been giving him at home.  Patient was called with no answer, but message left with full instruction as well as the number for him to call with any questions.

## 2016-04-10 NOTE — Patient Instructions (Signed)
[place] at [facility] Discharge Instructions  RECOMMENDATIONS MADE BY THE CONSULTANT AND ANY TEST RESULTS WILL BE SENT TO YOUR REFERRING PHYSICIAN.  Exam with Dr.  Muse today. Please see Amy as you leave for appointments.    Thank you for choosing [place] at [facility] to provide your oncology and hematology care.  To afford each patient quality time with our provider, please arrive at least 15 minutes before your scheduled appointment time.   Beginning January 23rd 2017 lab work for the [facility] will be done in the  Main lab at [facility] on 1st floor. If you have a lab appointment with the [place] please come in thru the  Main Entrance and check in at the main information desk  You need to re-schedule your appointment should you arrive 10 or more minutes late.  We strive to give you quality time with our providers, and arriving late affects you and other patients whose appointments are after yours.  Also, if you no show three or more times for appointments you may be dismissed from the clinic at the providers discretion.     Again, thank you for choosing [facility].  Our hope is that these requests will decrease the amount of time that you wait before being seen by our physicians.       _____________________________________________________________  Should you have questions after your visit to [facility], please contact our office at [phone] between the hours of 8:30 a.m. and 4:30 p.m.  Voicemails left after 4:30 p.m. will not be returned until the following business day.  For prescription refill requests, have your pharmacy contact our office.         Resources For Cancer Patients and their Caregivers ? American Cancer Society: Can assist with transportation, wigs, general needs, runs Look Good Feel Better.        (530)001-9318 ? Cancer Care: Provides financial assistance, online  support groups, medication/co-pay assistance.  1-800-813-HOPE 201-102-0648) ? [place] Assists [place] cancer patients and their families through emotional , educational and financial support.  775-559-7923 ? [place] DSS Where to apply for food stamps, Medicaid and utility assistance. (210) 302-0325 ? RCATS: Transportation to medical appointments. 430-323-2853 ? Social Security Administration: May apply for disability if have a Stage IV cancer. (253)337-1571 985-145-0901 ? [employer], Disability and Transit Services: Assists with nutrition, care and transit needs. [place] Support Programs: @10RELATIVEDAYS @ > Cancer Support Group  2nd Tuesday of the month 1pm-2pm, Journey Room  > Creative Journey  3rd Tuesday of the month 1130am-1pm, Journey Room  > Look Good Feel Better  1st Wednesday of the month 10am-12 noon, Journey Room (Call [place] to register [phone])
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
## 2016-04-11 ENCOUNTER — Encounter (HOSPITAL_COMMUNITY): Payer: Self-pay | Admitting: Hematology & Oncology

## 2016-05-18 ENCOUNTER — Ambulatory Visit (INDEPENDENT_AMBULATORY_CARE_PROVIDER_SITE_OTHER): Payer: Medicare Other | Admitting: Cardiology

## 2016-05-18 ENCOUNTER — Encounter: Payer: Self-pay | Admitting: Cardiology

## 2016-05-18 VITALS — BP 122/64 | HR 65 | Ht 64.0 in | Wt 184.0 lb

## 2016-05-18 DIAGNOSIS — Z95810 Presence of automatic (implantable) cardiac defibrillator: Secondary | ICD-10-CM | POA: Diagnosis not present

## 2016-05-18 DIAGNOSIS — I255 Ischemic cardiomyopathy: Secondary | ICD-10-CM | POA: Diagnosis not present

## 2016-05-18 DIAGNOSIS — I2510 Atherosclerotic heart disease of native coronary artery without angina pectoris: Secondary | ICD-10-CM

## 2016-05-18 DIAGNOSIS — I779 Disorder of arteries and arterioles, unspecified: Secondary | ICD-10-CM

## 2016-05-18 DIAGNOSIS — I739 Peripheral vascular disease, unspecified: Secondary | ICD-10-CM

## 2016-05-18 NOTE — Patient Instructions (Signed)
Your physician wants you to follow-up in: 6 months Dr Ferne Reus will receive a reminder letter in the mail two months in advance. If you don't receive a letter, please call our office to schedule the follow-up appointment.    Your physician has requested that you have a carotid duplex IN JUNE 2018 . This test is an ultrasound of the carotid arteries in your neck. It looks at blood flow through these arteries that supply the brain with blood. Allow one hour for this exam. There are no restrictions or special instructions.   Your physician recommends that you continue on your current medications as directed. Please refer to the Current Medication list given to you today.    Thank you for choosing [facility] !

## 2016-05-18 NOTE — Addendum Note (Signed)
Addended by: Barbarann Ehlers A on: 05/18/2016 11:11 AM   Modules accepted: Orders

## 2016-05-18 NOTE — Progress Notes (Signed)
Cardiology Office Note  Date: 05/18/2016   ID: [name], DOB [birth year], MRN [id number]  PCP: Alonza Bogus, MD  Primary Cardiologist: Rozann Lesches, MD   Chief Complaint  Patient presents with  . Coronary Artery Disease    History of Present Illness: [name] is an 83 y.o. male that I met back in June 2017. He presents for a routine follow-up visit. States that he has been doing very well, no angina symptoms or nitroglycerin use. He traveled to [place] over the holidays to be with family.  I reviewed his medications which are outlined below. Cardiac regimen is stable. He will be seeing Dr. Luan Pulling for follow-up lipid panel at next physical.  Last echocardiogram was in March 2016 as outlined below.  I reviewed his ECG today which shows sinus rhythm with right bundle branch block and left anterior fascicular block, PACs noted.  Past Medical History:  Diagnosis Date  . BPH (benign prostatic hypertrophy)   . Chronic systolic heart failure ([place])   . Coronary artery disease    Multivessel status post CABG 8/06, LIMA-LAD, SVG-CFX, SVG-distal RCA  . Essential hypertension   . Hyperlipidemia   . ICD (implantable cardiac defibrillator) in place    Medtronic  . Ischemic cardiomyopathy   . Nephrolithiasis   . Prostate cancer [facility])     Past Surgical History:  Procedure Laterality Date  . CARDIAC DEFIBRILLATOR PLACEMENT    . COLONOSCOPY    . COLONOSCOPY N/A 08/19/2013   Procedure: COLONOSCOPY;  Surgeon: Rogene Houston, MD;  Location: [facility];  Service: Endoscopy;  Laterality: N/A;  930  . CORONARY ARTERY BYPASS GRAFT     8/06 with left anterior descending artery, saphenous vein graft to circumflex, saphenous vein graft to distal right coronary artery  . HERNIA REPAIR    . POLYPECTOMY    . PROSTATE SURGERY      Current Outpatient Prescriptions  Medication Sig Dispense Refill  . acetaminophen (TYLENOL) 500 MG tablet Take 1 tablet (500 mg total) by mouth  every 6 (six) hours as needed for mild pain or fever. 30 tablet 0  . allopurinol (ZYLOPRIM) 300 MG tablet Take 300 mg by mouth daily.     Marland Kitchen aspirin EC 81 MG tablet Take 162 mg by mouth daily. Take two tablets daily--this will be 118m daily    . carvedilol (COREG) 12.5 MG tablet Take 12.5 mg by mouth 2 (two) times daily with a meal.    . cetirizine (ZYRTEC) 10 MG tablet Take 10 mg by mouth daily as needed for allergies.     . Cholecalciferol (VITAMIN D3) 1000 UNITS CAPS Take 1 capsule by mouth daily.     . cyanocobalamin (,VITAMIN B-12,) 1000 MCG/ML injection Inject 1 mL (1,000 mcg total) into the muscle once. 1 mL 14  . hydrochlorothiazide (HYDRODIURIL) 25 MG tablet TAKE 1/2 TABLET BY MOUTH ONCE DAILY 45 tablet 2  . losartan (COZAAR) 50 MG tablet take 1 and 1/2 tablets by mouth once daily 45 tablet 6  . simvastatin (ZOCOR) 40 MG tablet TAKE ONE TABLET BY MOUTH EVERY EVENING 30 tablet 0  . spironolactone (ALDACTONE) 25 MG tablet TAKE 1/2 TABLET BY MOUTH ONCE DAILY. 15 tablet 6  . vitamin C (ASCORBIC ACID) 500 MG tablet Take 500 mg by mouth daily.       No current facility-administered medications for this visit.    Allergies:  Codeine   Social History: The patient  reports that he quit smoking  about 24 years ago. His smoking use included Cigarettes. He has a 11.00 pack-year smoking history. He has never used smokeless tobacco. He reports that he does not drink alcohol or use drugs.   ROS:  Please see the history of present illness. Otherwise, complete review of systems is positive for none.  All other systems are reviewed and negative.   Physical Exam: VS:  BP 122/64   Pulse 65   Ht '5\' 4"'  (1.626 m)   Wt 184 lb (83.5 kg)   SpO2 96%   BMI 31.58 kg/m , BMI Body mass index is 31.58 kg/m.  Wt Readings from Last 3 Encounters:  05/18/16 184 lb (83.5 kg)  04/10/16 181 lb 12.8 oz (82.5 kg)  02/10/16 181 lb 9.6 oz (82.4 kg)    General: Elderly male, appears comfortable at rest. HEENT:  Conjunctiva and lids normal, oropharynx clear. Neck: Supple, no elevated JVP, right crotid bruit, no thyromegaly. Lungs: Clear to auscultation, nonlabored breathing at rest. Thorax: Device pocket site left upper chest. Cardiac: Regular rate and rhythm, no S3, soft systolic murmur, no pericardial rub. Abdomen: Soft, nontender, bowel sounds present, no guarding or rebound. Extremities: Trace ankle edema, distal pulses 2+. Skin: Warm and dry. Musculoskeletal: No kyphosis. Neuropsychiatric: Alert and oriented x3, affect grossly appropriate.  ECG: I personally reviewed the tracing from 05/02/2015 which showed sinus rhythm with right bundle branch block and left anterior fascicular block.  Recent Labwork: 07/14/2015: B Natriuretic Peptide 205.0 09/30/2015: ALT 19; AST 18; BUN 33; Creatinine, Ser 1.18; Potassium 5.3; Sodium 136 04/10/2016: Hemoglobin 11.6; Platelets 78     Component Value Date/Time   CHOL 122 09/09/2014 1123   TRIG 125.0 09/09/2014 1123   HDL 32.80 (L) 09/09/2014 1123   CHOLHDL 4 09/09/2014 1123   VLDL 25.0 09/09/2014 1123   LDLCALC 64 09/09/2014 1123    Other Studies Reviewed Today:  Carotid Dopplers 09/30/2015: IMPRESSION: 1. Bilateral carotid bifurcation and proximal ICA plaque resulting in less than less than 50% ICA stenosis. The exam does not exclude plaque ulceration or embolization. Continued surveillance recommended. 2. Origin stenosis of the left external carotid artery, of uncertain clinical significance. 3. Antegrade bilateral vertebral arterial flow.  Echocardiogram 08/11/2014: Mild LVH with LVEF 45-50%, basal lateral akinesis, grade 1 diastolic dysfunction, trivial aortic regurgitation, mild mitral regurgitation, moderate left atrial enlargement, PASP 31 mmHg.  Assessment and Plan:  1. Multivessel CAD status post CABG in 2006. He continues to do well without active angina on medical therapy and we will continue with observation. He has not undergone  recent ischemic testing, this will be discussed if symptoms intervene.  2. Ischemic cardiomyopathy with LVEF 45-50% based on last assessment. Reports NYHA class II dyspnea, no active heart failure symptoms.  3. History of Medtronic ICD placement by Dr. Lovena Le, device met ERI and was not replaced after discussion with LVEF in the range of 45-50%.  4. Moderate carotid artery disease. Follow-up carotid Dopplers will be obtained in the next 6 months.  Current medicines were reviewed with the patient today.   Orders Placed This Encounter  Procedures  . US Carotid Duplex Bilateral  . EKG 12-Lead    Disposition: Follow-up in 6 months.  Signed, Satira Sark, MD, [facility] 05/18/2016 11:02 AM    [facility] [place] [facility] [place]. [address], [place], [place] [address] Phone: [phone]; Fax: [phone]

## 2016-05-24 ENCOUNTER — Ambulatory Visit (HOSPITAL_COMMUNITY)
Admission: RE | Admit: 2016-05-24 | Discharge: 2016-05-24 | Disposition: A | Discharge status: Home / Self Care | Payer: Medicare Other | Source: Ambulatory Visit | Attending: Pulmonary Disease | Admitting: Pulmonary Disease

## 2016-05-24 ENCOUNTER — Other Ambulatory Visit (HOSPITAL_COMMUNITY): Payer: Self-pay | Admitting: Pulmonary Disease

## 2016-05-24 DIAGNOSIS — M79642 Pain in left hand: Secondary | ICD-10-CM | POA: Insufficient documentation

## 2016-05-24 DIAGNOSIS — M19041 Primary osteoarthritis, right hand: Secondary | ICD-10-CM | POA: Diagnosis not present

## 2016-05-24 DIAGNOSIS — M7989 Other specified soft tissue disorders: Secondary | ICD-10-CM | POA: Diagnosis not present

## 2016-05-24 DIAGNOSIS — M1990 Unspecified osteoarthritis, unspecified site: Secondary | ICD-10-CM | POA: Diagnosis not present

## 2016-05-24 DIAGNOSIS — M79641 Pain in right hand: Secondary | ICD-10-CM | POA: Diagnosis not present

## 2016-05-24 DIAGNOSIS — I25119 Atherosclerotic heart disease of native coronary artery with unspecified angina pectoris: Secondary | ICD-10-CM | POA: Diagnosis not present

## 2016-05-24 DIAGNOSIS — M19042 Primary osteoarthritis, left hand: Secondary | ICD-10-CM | POA: Diagnosis not present

## 2016-05-28 DIAGNOSIS — I10 Essential (primary) hypertension: Secondary | ICD-10-CM | POA: Diagnosis not present

## 2016-05-28 DIAGNOSIS — M1990 Unspecified osteoarthritis, unspecified site: Secondary | ICD-10-CM | POA: Diagnosis not present

## 2016-05-28 DIAGNOSIS — M79641 Pain in right hand: Secondary | ICD-10-CM | POA: Diagnosis not present

## 2016-05-28 DIAGNOSIS — I25119 Atherosclerotic heart disease of native coronary artery with unspecified angina pectoris: Secondary | ICD-10-CM | POA: Diagnosis not present

## 2016-05-28 DIAGNOSIS — M79642 Pain in left hand: Secondary | ICD-10-CM | POA: Diagnosis not present

## 2016-05-29 LAB — BASIC METABOLIC PANEL
BUN: 32 — AB (ref 4–21)
Creatinine: 1.3 (ref <=1.3)
Glucose: 112

## 2016-06-07 DIAGNOSIS — M79673 Pain in unspecified foot: Secondary | ICD-10-CM | POA: Diagnosis not present

## 2016-06-07 DIAGNOSIS — L600 Ingrowing nail: Secondary | ICD-10-CM | POA: Diagnosis not present

## 2016-06-07 DIAGNOSIS — B351 Tinea unguium: Secondary | ICD-10-CM | POA: Diagnosis not present

## 2016-06-14 ENCOUNTER — Encounter (HOSPITAL_COMMUNITY): Payer: Medicare Other

## 2016-06-14 ENCOUNTER — Encounter (HOSPITAL_COMMUNITY): Payer: Medicare Other | Attending: Oncology | Admitting: Oncology

## 2016-06-14 ENCOUNTER — Encounter (HOSPITAL_COMMUNITY): Payer: Self-pay

## 2016-06-14 VITALS — BP 139/59 | HR 65 | Temp 97.5°F | Resp 18 | Wt 181.3 lb

## 2016-06-14 DIAGNOSIS — D709 Neutropenia, unspecified: Secondary | ICD-10-CM | POA: Diagnosis not present

## 2016-06-14 DIAGNOSIS — D469 Myelodysplastic syndrome, unspecified: Secondary | ICD-10-CM

## 2016-06-14 DIAGNOSIS — D46A Refractory cytopenia with multilineage dysplasia: Secondary | ICD-10-CM | POA: Diagnosis not present

## 2016-06-14 DIAGNOSIS — D61818 Other pancytopenia: Secondary | ICD-10-CM

## 2016-06-14 DIAGNOSIS — N183 Chronic kidney disease, stage 3 (moderate): Secondary | ICD-10-CM

## 2016-06-14 DIAGNOSIS — E538 Deficiency of other specified B group vitamins: Secondary | ICD-10-CM

## 2016-06-14 DIAGNOSIS — D4620 Refractory anemia with excess of blasts, unspecified: Secondary | ICD-10-CM

## 2016-06-14 LAB — CBC WITH DIFFERENTIAL/PLATELET
BASOS ABS: 0 10*3/uL (ref 0.0–0.1)
BASOS PCT: 0 %
EOS ABS: 0 10*3/uL (ref 0.0–0.7)
EOS PCT: 0 %
HCT: 36.5 % — ABNORMAL LOW (ref 39.0–52.0)
Hemoglobin: 11.8 g/dL — ABNORMAL LOW (ref 13.0–17.0)
LYMPHS ABS: 0.7 10*3/uL (ref 0.7–4.0)
Lymphocytes Relative: 28 %
MCH: 31.4 pg (ref 26.0–34.0)
MCHC: 32.3 g/dL (ref 30.0–36.0)
MCV: 97.1 fL (ref 78.0–100.0)
Monocytes Absolute: 0.7 10*3/uL (ref 0.1–1.0)
Monocytes Relative: 28 %
Neutro Abs: 1.1 10*3/uL — ABNORMAL LOW (ref 1.7–7.7)
Neutrophils Relative %: 42 %
PLATELETS: 85 10*3/uL — AB (ref 150–400)
RBC: 3.76 MIL/uL — ABNORMAL LOW (ref 4.22–5.81)
RDW: 12.7 % (ref 11.5–15.5)
WBC: 2.6 10*3/uL — AB (ref 4.0–10.5)

## 2016-06-14 LAB — COMPREHENSIVE METABOLIC PANEL
ALT: 11 U/L — AB (ref 17–63)
AST: 16 U/L (ref 15–41)
Albumin: 4.2 g/dL (ref 3.5–5.0)
Alkaline Phosphatase: 47 U/L (ref 38–126)
Anion gap: 7 (ref 5–15)
BUN: 35 mg/dL — ABNORMAL HIGH (ref 6–20)
CHLORIDE: 102 mmol/L (ref 101–111)
CO2: 28 mmol/L (ref 22–32)
CREATININE: 1.28 mg/dL — AB (ref 0.61–1.24)
Calcium: 9.5 mg/dL (ref 8.9–10.3)
GFR calc non Af Amer: 49 mL/min — ABNORMAL LOW (ref >60)
GFR, EST AFRICAN AMERICAN: 56 mL/min — AB (ref >60)
Glucose, Bld: 168 mg/dL — ABNORMAL HIGH (ref 65–99)
Potassium: 4.3 mmol/L (ref 3.5–5.1)
SODIUM: 137 mmol/L (ref 135–145)
Total Bilirubin: 0.8 mg/dL (ref 0.3–1.2)
Total Protein: 7.2 g/dL (ref 6.5–8.1)

## 2016-06-14 LAB — VITAMIN B12: VITAMIN B 12: 933 pg/mL — AB (ref 180–914)

## 2016-06-14 LAB — LACTATE DEHYDROGENASE: LDH: 177 U/L (ref 98–192)
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
## 2016-06-14 NOTE — Patient Instructions (Signed)
[place] at [facility] Discharge Instructions  RECOMMENDATIONS MADE BY THE CONSULTANT AND ANY TEST RESULTS WILL BE SENT TO YOUR REFERRING PHYSICIAN.  Return to see [place] in 6 weeks  Labs with next visit  Thank you for choosing [place] at [facility] to provide your oncology and hematology care.  To afford each patient quality time with our provider, please arrive at least 15 minutes before your scheduled appointment time.    If you have a lab appointment with the [place] please come in thru the  Main Entrance and check in at the main information desk  You need to re-schedule your appointment should you arrive 10 or more minutes late.  We strive to give you quality time with our providers, and arriving late affects you and other patients whose appointments are after yours.  Also, if you no show three or more times for appointments you may be dismissed from the clinic at the providers discretion.     Again, thank you for choosing [facility].  Our hope is that these requests will decrease the amount of time that you wait before being seen by our physicians.       _____________________________________________________________  Should you have questions after your visit to [facility], please contact our office at [phone] between the hours of 8:30 a.m. and 4:30 p.m.  Voicemails left after 4:30 p.m. will not be returned until the following business day.  For prescription refill requests, have your pharmacy contact our office.       Resources For Cancer Patients and their Caregivers ? American Cancer Society: Can assist with transportation, wigs, general needs, runs Look Good Feel Better.        352-244-0170 ? Cancer Care: Provides financial assistance, online support groups, medication/co-pay assistance.  1-800-813-HOPE 585 629 2937) ? [place] Assists [place] cancer patients  and their families through emotional , educational and financial support.  936-184-7646 ? [place] DSS Where to apply for food stamps, Medicaid and utility assistance. 678-164-0020 ? [facility]: Transportation to medical appointments. 620-765-6613 ? Social Security Administration: May apply for disability if have a Stage IV cancer. 785-038-7504 (705)873-9069 ? [employer], Disability and Transit Services: Assists with nutrition, care and transit needs. [place] Support Programs: @10RELATIVEDAYS @ > Cancer Support Group  2nd Tuesday of the month 1pm-2pm, Journey Room  > Creative Journey  3rd Tuesday of the month 1130am-1pm, Journey Room  > Look Good Feel Better  1st Wednesday of the month 10am-12 noon, Journey Room (Call [place] to register [phone])

## 2016-06-14 NOTE — Progress Notes (Signed)
[name] gives pt B12 injections. Pt was given injection on 05-11-16, and 06-09-16.

## 2016-06-21 DIAGNOSIS — M79673 Pain in unspecified foot: Secondary | ICD-10-CM | POA: Diagnosis not present

## 2016-06-21 DIAGNOSIS — L600 Ingrowing nail: Secondary | ICD-10-CM | POA: Diagnosis not present

## 2016-07-24 ENCOUNTER — Encounter (HOSPITAL_COMMUNITY): Payer: Medicare Other | Attending: Oncology

## 2016-07-24 DIAGNOSIS — Z951 Presence of aortocoronary bypass graft: Secondary | ICD-10-CM | POA: Diagnosis not present

## 2016-07-24 DIAGNOSIS — E538 Deficiency of other specified B group vitamins: Secondary | ICD-10-CM | POA: Insufficient documentation

## 2016-07-24 DIAGNOSIS — Z95810 Presence of automatic (implantable) cardiac defibrillator: Secondary | ICD-10-CM | POA: Diagnosis not present

## 2016-07-24 DIAGNOSIS — Z87442 Personal history of urinary calculi: Secondary | ICD-10-CM | POA: Diagnosis not present

## 2016-07-24 DIAGNOSIS — N400 Enlarged prostate without lower urinary tract symptoms: Secondary | ICD-10-CM | POA: Insufficient documentation

## 2016-07-24 DIAGNOSIS — I255 Ischemic cardiomyopathy: Secondary | ICD-10-CM | POA: Insufficient documentation

## 2016-07-24 DIAGNOSIS — I2510 Atherosclerotic heart disease of native coronary artery without angina pectoris: Secondary | ICD-10-CM | POA: Diagnosis not present

## 2016-07-24 DIAGNOSIS — Z87891 Personal history of nicotine dependence: Secondary | ICD-10-CM | POA: Diagnosis not present

## 2016-07-24 DIAGNOSIS — Z8546 Personal history of malignant neoplasm of prostate: Secondary | ICD-10-CM | POA: Diagnosis not present

## 2016-07-24 DIAGNOSIS — E785 Hyperlipidemia, unspecified: Secondary | ICD-10-CM | POA: Insufficient documentation

## 2016-07-24 DIAGNOSIS — D61818 Other pancytopenia: Secondary | ICD-10-CM | POA: Diagnosis not present

## 2016-07-24 DIAGNOSIS — Z8249 Family history of ischemic heart disease and other diseases of the circulatory system: Secondary | ICD-10-CM | POA: Diagnosis not present

## 2016-07-24 DIAGNOSIS — Z9889 Other specified postprocedural states: Secondary | ICD-10-CM | POA: Diagnosis not present

## 2016-07-24 DIAGNOSIS — I110 Hypertensive heart disease with heart failure: Secondary | ICD-10-CM | POA: Insufficient documentation

## 2016-07-24 DIAGNOSIS — D4620 Refractory anemia with excess of blasts, unspecified: Secondary | ICD-10-CM | POA: Insufficient documentation

## 2016-07-24 DIAGNOSIS — Z800 Family history of malignant neoplasm of digestive organs: Secondary | ICD-10-CM | POA: Insufficient documentation

## 2016-07-24 DIAGNOSIS — I5022 Chronic systolic (congestive) heart failure: Secondary | ICD-10-CM | POA: Insufficient documentation

## 2016-07-24 LAB — CBC WITH DIFFERENTIAL/PLATELET
BASOS ABS: 0 10*3/uL (ref 0.0–0.1)
Basophils Relative: 0 %
EOS PCT: 0 %
Eosinophils Absolute: 0 10*3/uL (ref 0.0–0.7)
HEMATOCRIT: 34.8 % — AB (ref 39.0–52.0)
Hemoglobin: 11.8 g/dL — ABNORMAL LOW (ref 13.0–17.0)
Lymphocytes Relative: 26 %
Lymphs Abs: 1 10*3/uL (ref 0.7–4.0)
MCH: 32.2 pg (ref 26.0–34.0)
MCHC: 33.9 g/dL (ref 30.0–36.0)
MCV: 95.1 fL (ref 78.0–100.0)
MONO ABS: 1.2 10*3/uL — AB (ref 0.1–1.0)
MONOS PCT: 32 %
NEUTROS ABS: 1.6 10*3/uL — AB (ref 1.7–7.7)
Neutrophils Relative %: 42 %
PLATELETS: 91 10*3/uL — AB (ref 150–400)
RBC: 3.66 MIL/uL — ABNORMAL LOW (ref 4.22–5.81)
RDW: 13 % (ref 11.5–15.5)
WBC: 3.9 10*3/uL — ABNORMAL LOW (ref 4.0–10.5)

## 2016-07-24 LAB — COMPREHENSIVE METABOLIC PANEL
ALT: 10 U/L — ABNORMAL LOW (ref 17–63)
ANION GAP: 8 (ref 5–15)
AST: 13 U/L — ABNORMAL LOW (ref 15–41)
Albumin: 4 g/dL (ref 3.5–5.0)
Alkaline Phosphatase: 53 U/L (ref 38–126)
BILIRUBIN TOTAL: 0.8 mg/dL (ref 0.3–1.2)
BUN: 28 mg/dL — AB (ref 6–20)
CHLORIDE: 102 mmol/L (ref 101–111)
CO2: 27 mmol/L (ref 22–32)
Calcium: 9.1 mg/dL (ref 8.9–10.3)
Creatinine, Ser: 1.22 mg/dL (ref 0.61–1.24)
GFR, EST AFRICAN AMERICAN: 60 mL/min — AB (ref >60)
GFR, EST NON AFRICAN AMERICAN: 51 mL/min — AB (ref >60)
Glucose, Bld: 114 mg/dL — ABNORMAL HIGH (ref 65–99)
POTASSIUM: 4.4 mmol/L (ref 3.5–5.1)
Sodium: 137 mmol/L (ref 135–145)
TOTAL PROTEIN: 7.1 g/dL (ref 6.5–8.1)

## 2016-07-24 LAB — FOLATE: Folate: 22.4 ng/mL (ref >5.9)

## 2016-07-24 LAB — VITAMIN B12: VITAMIN B 12: 694 pg/mL (ref 180–914)

## 2016-07-27 ENCOUNTER — Encounter (HOSPITAL_COMMUNITY): Payer: Self-pay | Admitting: Oncology

## 2016-07-27 ENCOUNTER — Encounter (HOSPITAL_BASED_OUTPATIENT_CLINIC_OR_DEPARTMENT_OTHER): Payer: Medicare Other | Admitting: Oncology

## 2016-07-27 VITALS — BP 91/70 | HR 67 | Temp 97.6°F | Resp 18 | Wt 182.7 lb

## 2016-07-27 DIAGNOSIS — D469 Myelodysplastic syndrome, unspecified: Secondary | ICD-10-CM | POA: Diagnosis not present

## 2016-07-27 DIAGNOSIS — E538 Deficiency of other specified B group vitamins: Secondary | ICD-10-CM

## 2016-07-27 DIAGNOSIS — D4620 Refractory anemia with excess of blasts, unspecified: Secondary | ICD-10-CM

## 2016-07-27 HISTORY — DX: Deficiency of other specified B group vitamins: E53.8

## 2016-07-27 LAB — METHYLMALONIC ACID, SERUM: Methylmalonic Acid, Quantitative: 195 nmol/L (ref 0–378)

## 2016-07-27 NOTE — Assessment & Plan Note (Signed)
B12 deficiency, on IM B12 replacement therapy at home.  Labs today: B12, folate, MMA.  I personally reviewed and went over laboratory results with the patient.  The results are noted within this dictation.  Labs in 2 months: B12

## 2016-07-27 NOTE — Progress Notes (Signed)
Luke Bogus, MD [address] [place] [address]  Myelodysplasia, low grade [facility]) - Plan: CBC with Differential, Comprehensive metabolic panel, Vitamin O24  Vitamin B 12 deficiency - Plan: CBC with Differential, Vitamin B12  CURRENT THERAPY: B12 IM 1000 mcg every month, at home.  INTERVAL HISTORY: [name] 83 y.o. male returns for followup of pancytopenia in the setting of low-grade MDS and normal cytogenetics per bone marrow aspiration and biopsy AND B12 deficiency, on IM B12 replacement therapy at home.   He is scheduled to see Dr. Irene Limbo, but patient is waiting for >90 minutes and therefore, I saw the patient.  He denies any complaints.  He notes his low BP today and I have advised him to follow-up with primary care provider, however, since he is asymptomatic, I do not think there is any need in changing medications at this time.  He denies any dizziness today.  He denies any LOC.  Review of Systems  Constitutional: Negative.  Negative for chills, fever and weight loss.  HENT: Negative.   Eyes: Negative.   Respiratory: Negative.  Negative for cough.   Cardiovascular: Negative.  Negative for chest pain.  Gastrointestinal: Negative.  Negative for blood in stool, constipation, diarrhea, melena, nausea and vomiting.  Genitourinary: Negative.   Musculoskeletal: Negative.   Skin: Negative.   Neurological: Negative.  Negative for weakness.  Endo/Heme/Allergies: Negative.   Psychiatric/Behavioral: Negative.     Past Medical History:  Diagnosis Date  . BPH (benign prostatic hypertrophy)   . Chronic systolic heart failure ([place])   . Coronary artery disease    Multivessel status post CABG 8/06, LIMA-LAD, SVG-CFX, SVG-distal RCA  . Essential hypertension   . Hyperlipidemia   . ICD (implantable cardiac defibrillator) in place    Medtronic  . Ischemic cardiomyopathy   . Myelodysplasia, low grade ([place]) 11/27/2015  . Nephrolithiasis   .  Prostate cancer ([place])   . Vitamin B 12 deficiency 07/27/2016    Past Surgical History:  Procedure Laterality Date  . CARDIAC DEFIBRILLATOR PLACEMENT    . COLONOSCOPY    . COLONOSCOPY N/A 08/19/2013   Procedure: COLONOSCOPY;  Surgeon: Rogene Houston, MD;  Location: [facility];  Service: Endoscopy;  Laterality: N/A;  930  . CORONARY ARTERY BYPASS GRAFT     8/06 with left anterior descending artery, saphenous vein graft to circumflex, saphenous vein graft to distal right coronary artery  . HERNIA REPAIR    . POLYPECTOMY    . PROSTATE SURGERY      Family History  Problem Relation Age of Onset  . Colon cancer Father   . Coronary artery disease      Social History   Social History  . Marital status: Married    Spouse name: N/A  . Number of children: N/A  . Years of education: N/A   Occupational History  . Retired Retired   Social History Main Topics  . Smoking status: Former Smoker    Packs/day: 0.25    Years: 44.00    Types: Cigarettes    Quit date: 05/14/1992  . Smokeless tobacco: Never Used  . Alcohol use No  . Drug use: No  . Sexual activity: Not on file     Comment: married 63 years   Other Topics Concern  . Not on file   Social History Narrative  . No narrative on file     PHYSICAL EXAMINATION  ECOG PERFORMANCE STATUS: 1 -  Symptomatic but completely ambulatory  Vitals:   07/27/16 1428  BP: 91/70  Pulse: 67  Resp: 18  Temp: 97.6 F (36.4 C)    GENERAL:alert, no distress, well nourished, well developed, comfortable, cooperative, smiling and accompanied by wife.  Patient and family walking out of clinic due to wait to see provider. SKIN: skin color, texture, turgor are normal, no rashes or significant lesions HEAD: Normocephalic, No masses, lesions, tenderness or abnormalities EYES: normal, EOMI EARS: External ears normal OROPHARYNX:lips, buccal mucosa, and tongue normal and mucous membranes are moist  NECK: supple, trachea midline LYMPH:  not  examined BREAST:not examined LUNGS: not examined HEART: not examined ABDOMEN:not examined BACK: Back symmetric, no curvature. EXTREMITIES:less then 2 second capillary refill, no cyanosis  NEURO: alert & oriented x 3 with fluent speech, no focal motor/sensory deficits, gait normal   LABORATORY DATA: CBC    Component Value Date/Time   WBC 3.9 (L) 07/24/2016 1142   RBC 3.66 (L) 07/24/2016 1142   HGB 11.8 (L) 07/24/2016 1142   HCT 34.8 (L) 07/24/2016 1142   PLT 91 (L) 07/24/2016 1142   MCV 95.1 07/24/2016 1142   MCH 32.2 07/24/2016 1142   MCHC 33.9 07/24/2016 1142   RDW 13.0 07/24/2016 1142   LYMPHSABS 1.0 07/24/2016 1142   MONOABS 1.2 (H) 07/24/2016 1142   EOSABS 0.0 07/24/2016 1142   BASOSABS 0.0 07/24/2016 1142      Chemistry      Component Value Date/Time   NA 137 07/24/2016 1142   K 4.4 07/24/2016 1142   CL 102 07/24/2016 1142   CO2 27 07/24/2016 1142   BUN 28 (H) 07/24/2016 1142   CREATININE 1.22 07/24/2016 1142   CREATININE 1.43 (H) 07/24/2013 1623      Component Value Date/Time   CALCIUM 9.1 07/24/2016 1142   ALKPHOS 53 07/24/2016 1142   AST 13 (L) 07/24/2016 1142   ALT 10 (L) 07/24/2016 1142   BILITOT 0.8 07/24/2016 1142      Lab Results  Component Value Date   VITAMINB12 694 07/24/2016   Lab Results  Component Value Date   FOLATE 22.4 07/24/2016     PENDING LABS:   RADIOGRAPHIC STUDIES:  No results found.   PATHOLOGY:    ASSESSMENT AND PLAN:  Myelodysplasia, low grade (HCC) Pancytopenia in the setting of low-grade MDS and normal cytogenetics per bone marrow aspiration and biopsy.  Labs today: CBC diff, CMET.  I personally reviewed and went over laboratory results with the patient.  The results are noted within this dictation.  Labs in 2 months: CBC diff, CMET.  I have recommended follow-up with Primary Care Provider if BP remains below baseline.  He is asymptomatic and therefore no medication change is advised today.  Return in 2  months for follow-up.   Vitamin B 12 deficiency B12 deficiency, on IM B12 replacement therapy at home.  Labs today: B12, folate, MMA.  I personally reviewed and went over laboratory results with the patient.  The results are noted within this dictation.  Labs in 2 months: B12   ORDERS PLACED FOR THIS ENCOUNTER: Orders Placed This Encounter  Procedures  . CBC with Differential  . Comprehensive metabolic panel  . Vitamin B12    MEDICATIONS PRESCRIBED THIS ENCOUNTER: No orders of the defined types were placed in this encounter.   THERAPY PLAN:  Continue with B12 at home IM monthly.  We will continue to monitor his blood counts and B12 level.  All questions were answered. The patient  knows to call the clinic with any problems, questions or concerns. We can certainly see the patient much sooner if necessary.  Patient and plan discussed with Dr. Twana First and she is in agreement with the aforementioned.   This note is electronically signed by: Doy Mince 07/27/2016 4:10 PM

## 2016-07-27 NOTE — Assessment & Plan Note (Signed)
Pancytopenia in the setting of low-grade MDS and normal cytogenetics per bone marrow aspiration and biopsy.  Labs today: CBC diff, CMET.  I personally reviewed and went over laboratory results with the patient.  The results are noted within this dictation.  Labs in 2 months: CBC diff, CMET.  I have recommended follow-up with Primary Care Provider if BP remains below baseline.  He is asymptomatic and therefore no medication change is advised today.  Return in 2 months for follow-up.

## 2016-07-27 NOTE — Patient Instructions (Addendum)
[place] at [facility] Discharge Instructions  RECOMMENDATIONS MADE BY THE CONSULTANT AND ANY TEST RESULTS WILL BE SENT TO YOUR REFERRING PHYSICIAN.  Follow up in 2 months with lab work  Thank you for choosing [place] at [facility] to provide your oncology and hematology care.  To afford each patient quality time with our provider, please arrive at least 15 minutes before your scheduled appointment time.    If you have a lab appointment with the [facility] please come in thru the  Main Entrance and check in at the main information desk  You need to re-schedule your appointment should you arrive 10 or more minutes late.  We strive to give you quality time with our providers, and arriving late affects you and other patients whose appointments are after yours.  Also, if you no show three or more times for appointments you may be dismissed from the clinic at the providers discretion.     Again, thank you for choosing [facility].  Our hope is that these requests will decrease the amount of time that you wait before being seen by our physicians.       _____________________________________________________________  Should you have questions after your visit to [facility], please contact our office at [phone] between the hours of 8:30 a.m. and 4:30 p.m.  Voicemails left after 4:30 p.m. will not be returned until the following business day.  For prescription refill requests, have your pharmacy contact our office.       Resources For Cancer Patients and their Caregivers ? American Cancer Society: Can assist with transportation, wigs, general needs, runs Look Good Feel Better.        [phone] ? Cancer Care: Provides financial assistance, online support groups, medication/co-pay assistance.  1-800-813-HOPE 773-692-1733) ? [facility] Assists [place] cancer patients and their  families through emotional , educational and financial support.  6064953237 ? [place] DSS Where to apply for food stamps, Medicaid and utility assistance. 617-515-7238 ? RCATS: Transportation to medical appointments. 8578382068 ? Social Security Administration: May apply for disability if have a Stage IV cancer. ([phone] ? [employer], Disability and Transit Services: Assists with nutrition, care and transit needs. [place] Support Programs: @10RELATIVEDAYS @ > Cancer Support Group  2nd Tuesday of the month 1pm-2pm, Journey Room  > Creative Journey  3rd Tuesday of the month 1130am-1pm, Journey Room  > Look Good Feel Better  1st Wednesday of the month 10am-12 noon, Journey Room (Call [place] to register [phone])

## 2016-09-25 ENCOUNTER — Other Ambulatory Visit: Payer: Self-pay | Admitting: Cardiology

## 2016-09-26 ENCOUNTER — Encounter (HOSPITAL_COMMUNITY): Payer: Self-pay | Admitting: Oncology

## 2016-09-26 ENCOUNTER — Encounter (HOSPITAL_COMMUNITY): Payer: Medicare Other | Attending: Oncology | Admitting: Oncology

## 2016-09-26 ENCOUNTER — Encounter (HOSPITAL_COMMUNITY): Payer: Medicare Other

## 2016-09-26 VITALS — BP 105/86 | HR 70 | Resp 18 | Wt 180.6 lb

## 2016-09-26 DIAGNOSIS — N183 Chronic kidney disease, stage 3 (moderate): Secondary | ICD-10-CM | POA: Diagnosis not present

## 2016-09-26 DIAGNOSIS — D46A Refractory cytopenia with multilineage dysplasia: Secondary | ICD-10-CM | POA: Diagnosis not present

## 2016-09-26 DIAGNOSIS — D61818 Other pancytopenia: Secondary | ICD-10-CM | POA: Diagnosis not present

## 2016-09-26 DIAGNOSIS — E538 Deficiency of other specified B group vitamins: Secondary | ICD-10-CM | POA: Diagnosis not present

## 2016-09-26 DIAGNOSIS — D4620 Refractory anemia with excess of blasts, unspecified: Secondary | ICD-10-CM

## 2016-09-26 LAB — CBC WITH DIFFERENTIAL/PLATELET
Basophils Absolute: 0 10*3/uL (ref 0.0–0.1)
Basophils Relative: 0 %
EOS ABS: 0 10*3/uL (ref 0.0–0.7)
EOS PCT: 0 %
HCT: 34.8 % — ABNORMAL LOW (ref 39.0–52.0)
HEMOGLOBIN: 11.7 g/dL — AB (ref 13.0–17.0)
LYMPHS ABS: 1.1 10*3/uL (ref 0.7–4.0)
LYMPHS PCT: 30 %
MCH: 32.3 pg (ref 26.0–34.0)
MCHC: 33.6 g/dL (ref 30.0–36.0)
MCV: 96.1 fL (ref 78.0–100.0)
MONOS PCT: 32 %
Monocytes Absolute: 1.2 10*3/uL — ABNORMAL HIGH (ref 0.1–1.0)
NEUTROS PCT: 38 %
Neutro Abs: 1.4 10*3/uL — ABNORMAL LOW (ref 1.7–7.7)
Platelets: 88 10*3/uL — ABNORMAL LOW (ref 150–400)
RBC: 3.62 MIL/uL — AB (ref 4.22–5.81)
RDW: 13.1 % (ref 11.5–15.5)
WBC: 3.7 10*3/uL — AB (ref 4.0–10.5)

## 2016-09-26 LAB — COMPREHENSIVE METABOLIC PANEL
ALK PHOS: 51 U/L (ref 38–126)
ALT: 11 U/L — AB (ref 17–63)
AST: 17 U/L (ref 15–41)
Albumin: 4.1 g/dL (ref 3.5–5.0)
Anion gap: 9 (ref 5–15)
BILIRUBIN TOTAL: 0.9 mg/dL (ref 0.3–1.2)
BUN: 35 mg/dL — ABNORMAL HIGH (ref 6–20)
CHLORIDE: 103 mmol/L (ref 101–111)
CO2: 24 mmol/L (ref 22–32)
CREATININE: 1.26 mg/dL — AB (ref 0.61–1.24)
Calcium: 9.1 mg/dL (ref 8.9–10.3)
GFR calc Af Amer: 57 mL/min — ABNORMAL LOW (ref >60)
GFR, EST NON AFRICAN AMERICAN: 49 mL/min — AB (ref >60)
GLUCOSE: 178 mg/dL — AB (ref 65–99)
Potassium: 4.6 mmol/L (ref 3.5–5.1)
SODIUM: 136 mmol/L (ref 135–145)
Total Protein: 6.8 g/dL (ref 6.5–8.1)

## 2016-09-26 LAB — VITAMIN B12: Vitamin B-12: 599 pg/mL (ref 180–914)

## 2016-09-26 NOTE — Progress Notes (Signed)
Patient reports getting his B-12 shot on 01/27, 03/02, 04/27

## 2016-09-26 NOTE — Progress Notes (Signed)
[place] at [place] NOTE  Patient Care Team: Sinda Du, MD as PCP - General (Internal Medicine)  CHIEF COMPLAINTS/PURPOSE OF CONSULTATION:  Pancytopenia BMBX suggestive of low grade MDS, normal cytogenetics b12 deficiency  HISTORY OF PRESENTING ILLNESS:  [name] 83 y.o. male is here for further follow-up of his low grade MDS and B12 deficiency. He continues on B12 injections at home.   Patient presents for continued follow up with his wife. He has been doing well. He denies any chest pain, SOB, abdominal pain, focal weakness. He has some exertional dyspnea. Denies any recent infections or worsening fatigue or any bleeding complications. He states he has been eating healthier and has quit snacking at night and has lost 3 lbs. He continues to do work in his garden and denies any worsening fatigue or decline in his activity level.      MEDICAL HISTORY:  Past Medical History:  Diagnosis Date  . BPH (benign prostatic hypertrophy)   . Chronic systolic heart failure ([place])   . Coronary artery disease    Multivessel status post CABG 8/06, LIMA-LAD, SVG-CFX, SVG-distal RCA  . Essential hypertension   . Hyperlipidemia   . ICD (implantable cardiac defibrillator) in place    Medtronic  . Ischemic cardiomyopathy   . Myelodysplasia, low grade ([place]) 11/27/2015  . Nephrolithiasis   . Prostate cancer ([place])   . Vitamin B 12 deficiency 07/27/2016    SURGICAL HISTORY: Past Surgical History:  Procedure Laterality Date  . CARDIAC DEFIBRILLATOR PLACEMENT    . COLONOSCOPY    . COLONOSCOPY N/A 08/19/2013   Procedure: COLONOSCOPY;  Surgeon: Rogene Houston, MD;  [facility];  Service: Endoscopy;  Laterality: N/A;  930  . CORONARY ARTERY BYPASS GRAFT     8/06 with left anterior descending artery, saphenous vein graft to circumflex, saphenous vein graft to distal right coronary artery  . HERNIA REPAIR    . POLYPECTOMY    . PROSTATE SURGERY       SOCIAL HISTORY: Social History   Social History  . Marital status: Married    Spouse name: N/A  . Number of children: N/A  . Years of education: N/A   Occupational History  . Retired Retired   Social History Main Topics  . Smoking status: Former Smoker    Packs/day: 0.25    Years: 44.00    Types: Cigarettes    Quit date: 05/14/1992  . Smokeless tobacco: Never Used  . Alcohol use No  . Drug use: No  . Sexual activity: Not on file     Comment: married 63 years   Other Topics Concern  . Not on file   Social History Narrative  . No narrative on file   Married 63 years on June 16th. He says "she chased me for 7 years and I said "that's it, I'm tired.'" 2 sons; 3 grandchildren, all boys. 2 great-grandchildren, all boys. Another one on the way that's a boy. All of his siblings have girls. He was a "TV person." Fixed them, sold them; etc. Repair, sales. Born in [place]. Smoked for 40 years; quit in '94. He had a heart attack and quit. Denies any problems with alcohol, saying "no, I don't drink." Used to fish as a hobby, in both salt and freshwater; saltwater mostly.  FAMILY HISTORY: Family History  Problem Relation Age of Onset  . Colon cancer Father   . Coronary artery disease Unknown    His mother  died when he was 67, and his sister was 57 months old. Mother died of meningitis. Father lived to be 74, then died in 33 of colon cancer. 3 brothers; 1 sister. 1 half-brother, and 2 half-sisters. 2 oldest are passed. He doesn't know of any brothers having prostate cancer or bladder/kidney problems. One of his brothers had cancer of the lung. His father remarried when he was 76.  ALLERGIES:  is allergic to codeine.  MEDICATIONS:  Current Outpatient Prescriptions  Medication Sig Dispense Refill  . acetaminophen (TYLENOL) 500 MG tablet Take 1 tablet (500 mg total) by mouth every 6 (six) hours as needed for mild pain or fever. 30 tablet 0  . allopurinol (ZYLOPRIM) 300 MG  tablet Take 300 mg by mouth daily.     Marland Kitchen aspirin EC 81 MG tablet Take 162 mg by mouth daily. Take two tablets daily--this will be 162mg  daily    . carvedilol (COREG) 12.5 MG tablet Take 12.5 mg by mouth 2 (two) times daily with a meal.    . cetirizine (ZYRTEC) 10 MG tablet Take 10 mg by mouth daily as needed for allergies.     . Cholecalciferol (VITAMIN D3) 1000 UNITS CAPS Take 1 capsule by mouth daily.     . cyanocobalamin (,VITAMIN B-12,) 1000 MCG/ML injection Inject 1 mL (1,000 mcg total) into the muscle once. 1 mL 14  . hydrochlorothiazide (HYDRODIURIL) 25 MG tablet TAKE 1/2 TABLET BY MOUTH ONCE DAILY 45 tablet 2  . losartan (COZAAR) 50 MG tablet take 1 and 1/2 tablets by mouth once daily 45 tablet 6  . simvastatin (ZOCOR) 40 MG tablet TAKE ONE TABLET BY MOUTH EVERY EVENING 30 tablet 0  . spironolactone (ALDACTONE) 25 MG tablet take 1/2 tablet by mouth once daily 15 tablet 6  . vitamin C (ASCORBIC ACID) 500 MG tablet Take 500 mg by mouth daily.       No current facility-administered medications for this visit.     Review of Systems  Constitutional: Negative.  Negative for chills and fever.  HENT: Negative.  Negative for hearing loss, sore throat and tinnitus.   Eyes: Negative.  Negative for blurred vision, photophobia and discharge.  Respiratory: Negative.  Negative for cough, hemoptysis, shortness of breath and wheezing.   Cardiovascular: Negative.  Negative for chest pain, palpitations, orthopnea, claudication and leg swelling.  Gastrointestinal: Negative.  Negative for abdominal pain, constipation, diarrhea, melena, nausea and vomiting.  Genitourinary: Negative.  Negative for dysuria and hematuria.  Musculoskeletal: Negative.  Negative for back pain, joint pain and myalgias.  Skin: Negative.  Negative for itching and rash.  Neurological: Negative.  Negative for dizziness, weakness and headaches.  Endo/Heme/Allergies: Negative.  Negative for environmental allergies and polydipsia.  Does not bruise/bleed easily.  Psychiatric/Behavioral: Negative.  Negative for depression. The patient is not nervous/anxious and does not have insomnia.   All other systems reviewed and are negative. 14 point ROS was done and is otherwise as detailed above or in HPI  PHYSICAL EXAMINATION: ECOG PERFORMANCE STATUS: 1 - Symptomatic but completely ambulatory  Vitals:   09/26/16 1502  BP: 105/86  Pulse: 70  Resp: 18    Physical Exam  Constitutional: He is oriented to person, place, and time and well-developed, well-nourished, and in no distress. No distress.  HENT:  Head: Normocephalic and atraumatic.  Nose: Nose normal.  Mouth/Throat: Oropharynx is clear and moist. No oropharyngeal exudate.  Eyes: Conjunctivae and EOM are normal. Pupils are equal, round, and reactive to light. Right eye exhibits  no discharge. Left eye exhibits no discharge. No scleral icterus.  Neck: Normal range of motion. Neck supple. No JVD present. No tracheal deviation present. No thyromegaly present.  Cardiovascular: Normal rate, regular rhythm and normal heart sounds.  Exam reveals no gallop and no friction rub.   No murmur heard. Pulmonary/Chest: Effort normal and breath sounds normal. No respiratory distress. He has no wheezes. He has no rales.  Abdominal: Soft. Bowel sounds are normal. He exhibits no distension and no mass. There is no tenderness. There is no rebound and no guarding.  Musculoskeletal: Normal range of motion. He exhibits no edema or tenderness.  Lymphadenopathy:    He has no cervical adenopathy.  Neurological: He is alert and oriented to person, place, and time. He has normal reflexes. No cranial nerve deficit. Gait normal. Coordination normal.  Skin: Skin is warm and dry. No rash noted. No erythema. No pallor.  Psychiatric: Mood, memory, affect and judgment normal.  Nursing note and vitals reviewed.   LABORATORY DATA:  I have reviewed the data as listed Lab Results  Component Value Date    WBC 3.7 (L) 09/26/2016   HGB 11.7 (L) 09/26/2016   HCT 34.8 (L) 09/26/2016   MCV 96.1 09/26/2016   PLT 88 (L) 09/26/2016   CMP     Component Value Date/Time   NA 136 09/26/2016 1359   K 4.6 09/26/2016 1359   CL 103 09/26/2016 1359   CO2 24 09/26/2016 1359   GLUCOSE 178 (H) 09/26/2016 1359   BUN 35 (H) 09/26/2016 1359   CREATININE 1.26 (H) 09/26/2016 1359   CREATININE 1.43 (H) 07/24/2013 1623   CALCIUM 9.1 09/26/2016 1359   PROT 6.8 09/26/2016 1359   ALBUMIN 4.1 09/26/2016 1359   AST 17 09/26/2016 1359   ALT 11 (L) 09/26/2016 1359   ALKPHOS 51 09/26/2016 1359   BILITOT 0.9 09/26/2016 1359   GFRNONAA 49 (L) 09/26/2016 1359   GFRAA 57 (L) 09/26/2016 1359    Results for DAVIED, NOCITO (MRN [id number]) as of 06/14/2016 08:17  Ref. Range 01/17/2016 14:07 03/09/2016 12:37 04/10/2016 10:15  NEUT# Latest Ref Range: 1.7 - 7.7 K/uL 1.0 (L) 1.3 (L) 1.2 (L)     Results for BRAYLN, DUQUE (MRN [id number])   Ref. Range 09/30/2015 15:00  Total Protein ELP Latest Ref Range: 6.0-8.5 g/dL 6.9  IgG (Immunoglobin G), Serum Latest Ref Range: [id number] mg/dL 972  IgA Latest Ref Range: 61-437 mg/dL 104  IgM, Serum Latest Ref Range: 15-143 mg/dL 116  Immunofixation Result, Serum Unknown Comment                NORMAL CYTOGENETICS  RADIOGRAPHIC STUDIES: I have personally reviewed the radiological images as listed and agreed with the findings in the report. No results found.  Study Result  RIGHT HAND - COMPLETE 3+ VIEW 05/24/2016  IMPRESSION: Mild degenerative change without acute abnormality.   Study Result  LEFT HAND - COMPLETE 3+ VIEW 05/24/2016  IMPRESSION: Degenerative change without acute bony abnormality.   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 Results are noted above.  His mild pancytopenia has been stable  and has not worsened on today's lab. Continue observation of his CBC at this time.  Continue Vitamin B12 injections at this time.   F/U in 3 months with labs.   Orders Placed This Encounter  Procedures  . CBC with  Differential    Standing Status:   Future    Standing Expiration Date:   09/26/2017  . Comprehensive metabolic panel    Standing Status:   Future    Standing Expiration Date:   09/26/2017  . Vitamin B12    Standing Status:   Future    Standing Expiration Date:   09/26/2017     All questions were answered. The patient knows to call the clinic with any problems, questions or concerns.  This document serves as a record of services personally performed by Twana First, MD. It was created on her behalf by Shirlean Mylar, a trained medical scribe. The creation of this record is based on the scribe's personal observations and the provider's statements to them. This document has been checked and approved by the attending provider.   I have reviewed the above documentation for accuracy and completeness and I agree with the above.  This note was electronically signed.  Twana First, MD  09/26/2016 3:13 PM

## 2016-09-26 NOTE — Patient Instructions (Addendum)
[facility] at [facility] Discharge Instructions  RECOMMENDATIONS MADE BY THE CONSULTANT AND ANY TEST RESULTS WILL BE SENT TO YOUR REFERRING PHYSICIAN.  You were seen today by Dr. Louise Zhou Follow up in 3 months  Thank you for choosing [facility] at [facility] to provide your oncology and hematology care.  To afford each patient quality time with our provider, please arrive at least 15 minutes before your scheduled appointment time.    If you have a lab appointment with the Cancer Center please come in thru the  Main Entrance and check in at the main information desk  You need to re-schedule your appointment should you arrive 10 or more minutes late.  We strive to give you quality time with our providers, and arriving late affects you and other patients whose appointments are after yours.  Also, if you no show three or more times for appointments you may be dismissed from the clinic at the providers discretion.     Again, thank you for choosing [facility].  Our hope is that these requests will decrease the amount of time that you wait before being seen by our physicians.       _____________________________________________________________  Should you have questions after your visit to [facility], please contact our office at [phone] between the hours of 8:30 a.m. and 4:30 p.m.  Voicemails left after 4:30 p.m. will not be returned until the following business day.  For prescription refill requests, have your pharmacy contact our office.       Resources For Cancer Patients and their Caregivers ? American Cancer Society: Can assist with transportation, wigs, general needs, runs Look Good Feel Better.        1-888-227-6333 ? Cancer Care: Provides financial assistance, online support groups, medication/co-pay assistance.  1-800-813-HOPE (4673) ? [facility] Assists [place] cancer  patients and their families through emotional , educational and financial support.  336-427-4357 ? [place] DSS Where to apply for food stamps, Medicaid and utility assistance. 336-342-1394 ? RCATS: Transportation to medical appointments. 336-347-2287 ? Social Security Administration: May apply for disability if have a Stage IV cancer. 336-342-7796 1-800-772-1213 ? [place] Aging, Disability and Transit Services: Assists with nutrition, care and transit needs. 336-349-2343  Cancer Center Support Programs: @10RELATIVEDAYS@ > Cancer Support Group  2nd Tuesday of the month 1pm-2pm, Journey Room  > Creative Journey  3rd Tuesday of the month 1130am-1pm, Journey Room  > Look Good Feel Better  1st Wednesday of the month 10am-12 noon, Journey Room (Call [employer] to register 1-800-395-5775)    

## 2016-09-28 DIAGNOSIS — I10 Essential (primary) hypertension: Secondary | ICD-10-CM | POA: Diagnosis not present

## 2016-09-28 DIAGNOSIS — R21 Rash and other nonspecific skin eruption: Secondary | ICD-10-CM | POA: Diagnosis not present

## 2016-09-28 DIAGNOSIS — I25119 Atherosclerotic heart disease of native coronary artery with unspecified angina pectoris: Secondary | ICD-10-CM | POA: Diagnosis not present

## 2016-09-28 DIAGNOSIS — I509 Heart failure, unspecified: Secondary | ICD-10-CM | POA: Diagnosis not present

## 2016-10-24 ENCOUNTER — Ambulatory Visit: Payer: Medicare Other

## 2016-10-24 DIAGNOSIS — I6523 Occlusion and stenosis of bilateral carotid arteries: Secondary | ICD-10-CM | POA: Diagnosis not present

## 2016-10-24 DIAGNOSIS — I779 Disorder of arteries and arterioles, unspecified: Secondary | ICD-10-CM

## 2016-10-24 DIAGNOSIS — I739 Peripheral vascular disease, unspecified: Principal | ICD-10-CM

## 2016-10-24 LAB — VAS US CAROTID
LCCADSYS: -120 cm/s
LCCAPDIAS: 14 cm/s
LCCAPSYS: 108 cm/s
LEFT ECA DIAS: -14 cm/s
LEFT VERTEBRAL DIAS: 22 cm/s
LICADDIAS: -28 cm/s
LICAPSYS: -167 cm/s
Left CCA dist dias: -23 cm/s
Left ICA dist sys: -131 cm/s
Left ICA prox dias: -23 cm/s
RCCAPSYS: 101 cm/s
RIGHT ECA DIAS: 0 cm/s
RIGHT VERTEBRAL DIAS: -12 cm/s
Right CCA prox dias: 13 cm/s
Right cca dist sys: -107 cm/s

## 2016-10-26 ENCOUNTER — Other Ambulatory Visit (HOSPITAL_COMMUNITY): Payer: Self-pay | Admitting: Hematology & Oncology

## 2016-10-26 DIAGNOSIS — D61818 Other pancytopenia: Secondary | ICD-10-CM

## 2016-12-10 NOTE — Progress Notes (Signed)
Cardiology Office Note  Date: 12/11/2016   ID: [name], DOB [birth year], MRN [id number]  PCP: Sinda Du, MD  Primary Cardiologist: Rozann Lesches, MD   Chief Complaint  Patient presents with  . Coronary Artery Disease    History of Present Illness: [name] is an 83 y.o. male last seen in January. He presents for a routine follow-up visit. Reports no angina symptoms or nitroglycerin use. States that he remains as active as he can, enjoys walking for exercise. [place]  I reviewed his medications which are outlined below. We discussed decreasing aspirin 81 mg daily for now.  Recent carotid Dopplers are outlined below. Only 1-39% bilateral ICA stenoses noted.  He has a history of Medtronic ICD placement by Dr. Lovena Le, device met ERI and was not replaced after discussion with LVEF in the range of 45-50%. He does not report any significant palpitations or syncope.  Past Medical History:  Diagnosis Date  . BPH (benign prostatic hypertrophy)   . Chronic systolic heart failure ([place])   . Coronary artery disease    Multivessel status post CABG 8/06, LIMA-LAD, SVG-CFX, SVG-distal RCA  . Essential hypertension   . Hyperlipidemia   . ICD (implantable cardiac defibrillator) in place    Medtronic  . Ischemic cardiomyopathy   . Myelodysplasia, low grade ([place]) 11/27/2015  . Nephrolithiasis   . Prostate cancer ([place])   . Vitamin B 12 deficiency 07/27/2016    Past Surgical History:  Procedure Laterality Date  . CARDIAC DEFIBRILLATOR PLACEMENT    . COLONOSCOPY    . COLONOSCOPY N/A 08/19/2013   Procedure: COLONOSCOPY;  Surgeon: Rogene Houston, MD;  [facility];  Service: Endoscopy;  Laterality: N/A;  930  . CORONARY ARTERY BYPASS GRAFT     8/06 with left anterior descending artery, saphenous vein graft to circumflex, saphenous vein graft to distal right coronary artery  . HERNIA REPAIR    . POLYPECTOMY    .  PROSTATE SURGERY      Current Outpatient Prescriptions  Medication Sig Dispense Refill  . acetaminophen (TYLENOL) 500 MG tablet Take 1 tablet (500 mg total) by mouth every 6 (six) hours as needed for mild pain or fever. 30 tablet 0  . allopurinol (ZYLOPRIM) 300 MG tablet Take 300 mg by mouth daily.     Marland Kitchen aspirin EC 81 MG tablet Take 162 mg by mouth daily. Take two tablets daily--this will be 170m daily    . carvedilol (COREG) 12.5 MG tablet Take 12.5 mg by mouth 2 (two) times daily with a meal.    . cetirizine (ZYRTEC) 10 MG tablet Take 10 mg by mouth daily as needed for allergies.     . Cholecalciferol (VITAMIN D3) 1000 UNITS CAPS Take 1 capsule by mouth daily.     . cyanocobalamin (,VITAMIN B-12,) 1000 MCG/ML injection INJECT 1ML INTO THE MUSCLE ONCE AS DIRECTED 1 mL 14  . diphenhydrAMINE (BENADRYL) 25 MG tablet Take 25 mg by mouth at bedtime as needed.    . hydrochlorothiazide (HYDRODIURIL) 25 MG tablet TAKE 1/2 TABLET BY MOUTH ONCE DAILY 45 tablet 2  . losartan (COZAAR) 50 MG tablet take 1 and 1/2 tablets by mouth once daily 45 tablet 6  . simvastatin (ZOCOR) 40 MG tablet TAKE ONE TABLET BY MOUTH EVERY EVENING 30 tablet 0  . spironolactone (ALDACTONE) 25 MG tablet take 1/2 tablet by mouth once daily 15 tablet 6  . vitamin C (  ASCORBIC ACID) 500 MG tablet Take 500 mg by mouth daily.       No current facility-administered medications for this visit.    Allergies:  Codeine   Social History: The patient  reports that he quit smoking about 24 years ago. His smoking use included Cigarettes. He has a 11.00 pack-year smoking history. He has never used smokeless tobacco. He reports that he does not drink alcohol or use drugs.   ROS:  Please see the history of present illness. Otherwise, complete review of systems is positive for arthritic pains, intermittent itching.  All other systems are reviewed and negative.   Physical Exam: VS:  BP 108/64   Pulse 70   Ht '5\' 4"'  (1.626 m)   Wt 178 lb  (80.7 kg)   SpO2 95%   BMI 30.55 kg/m , BMI Body mass index is 30.55 kg/m.  Wt Readings from Last 3 Encounters:  12/11/16 178 lb (80.7 kg)  09/26/16 180 lb 9.6 oz (81.9 kg)  07/27/16 182 lb 11.2 oz (82.9 kg)    General: Elderly male, appears comfortable at rest. HEENT: Conjunctiva and lids normal, oropharynx clear. Neck: Supple, no elevated JVP, right crotid bruit, no thyromegaly. Lungs: Clear to auscultation, nonlabored breathing at rest. Thorax: Device pocket site left upper chest. Cardiac: Regular rate and rhythm, no S3, soft systolic murmur, no pericardial rub. Abdomen: Soft, nontender, bowel sounds present, no guarding or rebound. Extremities: Trace ankle edema, distal pulses 2+. Skin: Warm and dry. Musculoskeletal: No kyphosis. Neuropsychiatric: Alert and oriented x3, affect grossly appropriate.  ECG: I personally reviewed the tracing from 05/18/2016 which showed sinus rhythm with PACs, right bundle branch block and left anterior fascicular block.  Recent Labwork: 09/26/2016: ALT 11; AST 17; BUN 35; Creatinine, Ser 1.26; Hemoglobin 11.7; Platelets 88; Potassium 4.6; Sodium 136     Component Value Date/Time   CHOL 122 09/09/2014 1123   TRIG 125.0 09/09/2014 1123   HDL 32.80 (L) 09/09/2014 1123   CHOLHDL 4 09/09/2014 1123   VLDL 25.0 09/09/2014 1123   LDLCALC 64 09/09/2014 1123    Other Studies Reviewed Today:  Carotid Dopplers 10/24/2016: Stable 1-39% bilateral ICA stenoses.  Assessment and Plan:  1. Multivessel CAD status post CABG in 2006. He reports no angina symptoms on medical therapy and we continue with observation and less symptoms intervene. Reduce aspirin to 81 mg daily.  2. Ischemic cardiomyopathy with last LVEF 45-50%. Other than aspirin, no change in present regimen.  3. Mild bilateral carotid artery disease by recent follow-up Dopplers.  Current medicines were reviewed with the patient today.  Disposition: Follow-up in 6 months.  Signed, Satira Sark, MD, [facility] 12/11/2016 1:59 PM    [place] at [facility] [address] [place], [place] [address] Phone: [phone]; Fax: [phone]

## 2016-12-11 ENCOUNTER — Ambulatory Visit (INDEPENDENT_AMBULATORY_CARE_PROVIDER_SITE_OTHER): Payer: Medicare Other | Admitting: Cardiology

## 2016-12-11 ENCOUNTER — Encounter: Payer: Self-pay | Admitting: Cardiology

## 2016-12-11 VITALS — BP 108/64 | HR 70 | Ht 64.0 in | Wt 178.0 lb

## 2016-12-11 DIAGNOSIS — I2510 Atherosclerotic heart disease of native coronary artery without angina pectoris: Secondary | ICD-10-CM | POA: Diagnosis not present

## 2016-12-11 DIAGNOSIS — I255 Ischemic cardiomyopathy: Secondary | ICD-10-CM

## 2016-12-11 DIAGNOSIS — I739 Peripheral vascular disease, unspecified: Secondary | ICD-10-CM

## 2016-12-11 DIAGNOSIS — I779 Disorder of arteries and arterioles, unspecified: Secondary | ICD-10-CM

## 2016-12-11 NOTE — Patient Instructions (Signed)
Your physician wants you to follow-up in: 6 months Dr Ferne Reus will receive a reminder letter in the mail two months in advance. If you don't receive a letter, please call our office to schedule the follow-up appointment.      REDUCE Aspirin to 81 mg daily     No change to other meds     No lab work or tests ordered today.      Thank you for choosing [facility] !

## 2016-12-19 ENCOUNTER — Other Ambulatory Visit: Payer: Self-pay | Admitting: Cardiology

## 2016-12-25 ENCOUNTER — Ambulatory Visit (HOSPITAL_COMMUNITY): Payer: Medicare Other

## 2016-12-25 ENCOUNTER — Other Ambulatory Visit (HOSPITAL_COMMUNITY): Payer: Medicare Other

## 2017-01-09 ENCOUNTER — Other Ambulatory Visit (HOSPITAL_COMMUNITY): Payer: Medicare Other

## 2017-01-09 ENCOUNTER — Ambulatory Visit (HOSPITAL_COMMUNITY): Payer: Medicare Other | Admitting: Adult Health

## 2017-01-10 ENCOUNTER — Other Ambulatory Visit (HOSPITAL_COMMUNITY): Payer: Self-pay | Admitting: Pulmonary Disease

## 2017-01-10 DIAGNOSIS — I10 Essential (primary) hypertension: Secondary | ICD-10-CM | POA: Diagnosis not present

## 2017-01-10 DIAGNOSIS — I25119 Atherosclerotic heart disease of native coronary artery with unspecified angina pectoris: Secondary | ICD-10-CM | POA: Diagnosis not present

## 2017-01-10 DIAGNOSIS — I110 Hypertensive heart disease with heart failure: Secondary | ICD-10-CM | POA: Diagnosis not present

## 2017-01-10 DIAGNOSIS — R1031 Right lower quadrant pain: Secondary | ICD-10-CM | POA: Diagnosis not present

## 2017-01-17 ENCOUNTER — Ambulatory Visit (HOSPITAL_COMMUNITY)
Admission: RE | Admit: 2017-01-17 | Discharge: 2017-01-17 | Disposition: A | Discharge status: Home / Self Care | Payer: Medicare Other | Source: Ambulatory Visit | Attending: Pulmonary Disease | Admitting: Pulmonary Disease

## 2017-01-17 DIAGNOSIS — R1031 Right lower quadrant pain: Secondary | ICD-10-CM | POA: Diagnosis not present

## 2017-01-17 DIAGNOSIS — I700 Atherosclerosis of aorta: Secondary | ICD-10-CM | POA: Insufficient documentation

## 2017-01-17 DIAGNOSIS — I10 Essential (primary) hypertension: Secondary | ICD-10-CM | POA: Diagnosis not present

## 2017-01-17 DIAGNOSIS — K8020 Calculus of gallbladder without cholecystitis without obstruction: Secondary | ICD-10-CM | POA: Diagnosis not present

## 2017-01-17 DIAGNOSIS — B029 Zoster without complications: Secondary | ICD-10-CM | POA: Diagnosis not present

## 2017-01-17 LAB — POCT I-STAT CREATININE: Creatinine, Ser: 1.4 mg/dL — ABNORMAL HIGH (ref 0.61–1.24)

## 2017-01-17 MED ORDER — IOPAMIDOL (ISOVUE-300) INJECTION 61%
100.0000 mL | Freq: Once | INTRAVENOUS | Status: AC | PRN
  Administered 2017-01-17: 80 mL via INTRAVENOUS

## 2017-01-21 ENCOUNTER — Ambulatory Visit (HOSPITAL_COMMUNITY): Payer: Medicare Other

## 2017-01-29 DIAGNOSIS — B029 Zoster without complications: Secondary | ICD-10-CM | POA: Diagnosis not present

## 2017-01-29 DIAGNOSIS — Z85828 Personal history of other malignant neoplasm of skin: Secondary | ICD-10-CM | POA: Diagnosis not present

## 2017-01-29 DIAGNOSIS — L570 Actinic keratosis: Secondary | ICD-10-CM | POA: Diagnosis not present

## 2017-01-30 ENCOUNTER — Encounter (HOSPITAL_COMMUNITY): Payer: Medicare Other | Attending: Oncology

## 2017-01-30 ENCOUNTER — Encounter (HOSPITAL_BASED_OUTPATIENT_CLINIC_OR_DEPARTMENT_OTHER): Payer: Medicare Other | Admitting: Adult Health

## 2017-01-30 ENCOUNTER — Encounter (HOSPITAL_COMMUNITY): Payer: Self-pay | Admitting: Adult Health

## 2017-01-30 VITALS — BP 121/55 | HR 63 | Temp 98.5°F | Resp 18 | Wt 178.1 lb

## 2017-01-30 DIAGNOSIS — D4620 Refractory anemia with excess of blasts, unspecified: Secondary | ICD-10-CM

## 2017-01-30 DIAGNOSIS — D46A Refractory cytopenia with multilineage dysplasia: Secondary | ICD-10-CM

## 2017-01-30 DIAGNOSIS — D61818 Other pancytopenia: Secondary | ICD-10-CM

## 2017-01-30 DIAGNOSIS — H6120 Impacted cerumen, unspecified ear: Secondary | ICD-10-CM | POA: Diagnosis not present

## 2017-01-30 DIAGNOSIS — E538 Deficiency of other specified B group vitamins: Secondary | ICD-10-CM | POA: Insufficient documentation

## 2017-01-30 DIAGNOSIS — N183 Chronic kidney disease, stage 3 (moderate): Secondary | ICD-10-CM | POA: Diagnosis not present

## 2017-01-30 DIAGNOSIS — B029 Zoster without complications: Secondary | ICD-10-CM | POA: Diagnosis not present

## 2017-01-30 DIAGNOSIS — I10 Essential (primary) hypertension: Secondary | ICD-10-CM | POA: Diagnosis not present

## 2017-01-30 DIAGNOSIS — I25119 Atherosclerotic heart disease of native coronary artery with unspecified angina pectoris: Secondary | ICD-10-CM | POA: Diagnosis not present

## 2017-01-30 DIAGNOSIS — I509 Heart failure, unspecified: Secondary | ICD-10-CM | POA: Diagnosis not present

## 2017-01-30 LAB — COMPREHENSIVE METABOLIC PANEL
ALT: 12 U/L — AB (ref 17–63)
AST: 15 U/L (ref 15–41)
Albumin: 4.1 g/dL (ref 3.5–5.0)
Alkaline Phosphatase: 45 U/L (ref 38–126)
Anion gap: 8 (ref 5–15)
BILIRUBIN TOTAL: 0.6 mg/dL (ref 0.3–1.2)
BUN: 29 mg/dL — ABNORMAL HIGH (ref 6–20)
CO2: 26 mmol/L (ref 22–32)
Calcium: 9.5 mg/dL (ref 8.9–10.3)
Chloride: 104 mmol/L (ref 101–111)
Creatinine, Ser: 1.28 mg/dL — ABNORMAL HIGH (ref 0.61–1.24)
GFR calc Af Amer: 56 mL/min — ABNORMAL LOW (ref >60)
GFR, EST NON AFRICAN AMERICAN: 48 mL/min — AB (ref >60)
GLUCOSE: 116 mg/dL — AB (ref 65–99)
Potassium: 4.6 mmol/L (ref 3.5–5.1)
Sodium: 138 mmol/L (ref 135–145)
Total Protein: 6.9 g/dL (ref 6.5–8.1)

## 2017-01-30 LAB — CBC WITH DIFFERENTIAL/PLATELET
Basophils Absolute: 0 10*3/uL (ref 0.0–0.1)
Basophils Relative: 0 %
EOS PCT: 0 %
Eosinophils Absolute: 0 10*3/uL (ref 0.0–0.7)
HCT: 35.8 % — ABNORMAL LOW (ref 39.0–52.0)
HEMOGLOBIN: 11.8 g/dL — AB (ref 13.0–17.0)
Lymphocytes Relative: 26 %
Lymphs Abs: 0.9 10*3/uL (ref 0.7–4.0)
MCH: 31.8 pg (ref 26.0–34.0)
MCHC: 33 g/dL (ref 30.0–36.0)
MCV: 96.5 fL (ref 78.0–100.0)
Monocytes Absolute: 1 10*3/uL (ref 0.1–1.0)
Monocytes Relative: 28 %
NEUTROS PCT: 46 %
Neutro Abs: 1.6 10*3/uL — ABNORMAL LOW (ref 1.7–7.7)
PLATELETS: 103 10*3/uL — AB (ref 150–400)
RBC: 3.71 MIL/uL — AB (ref 4.22–5.81)
RDW: 13.8 % (ref 11.5–15.5)
WBC: 3.5 10*3/uL — AB (ref 4.0–10.5)

## 2017-01-30 LAB — VITAMIN B12: Vitamin B-12: 581 pg/mL (ref 180–914)

## 2017-01-30 NOTE — Patient Instructions (Addendum)
[place] at [facility] Discharge Instructions  RECOMMENDATIONS MADE BY THE CONSULTANT AND ANY TEST RESULTS WILL BE SENT TO YOUR REFERRING PHYSICIAN.  You were seen today by Mike Craze, NP Follow up in 4 months with labs See schedulers up front for appointments    Thank you for choosing [place] at [facility] to provide your oncology and hematology care.  To afford each patient quality time with our provider, please arrive at least 15 minutes before your scheduled appointment time.    If you have a lab appointment with the [facility] please come in thru the  Main Entrance and check in at the main information desk  You need to re-schedule your appointment should you arrive 10 or more minutes late.  We strive to give you quality time with our providers, and arriving late affects you and other patients whose appointments are after yours.  Also, if you no show three or more times for appointments you may be dismissed from the clinic at the providers discretion.     Again, thank you for choosing [facility].  Our hope is that these requests will decrease the amount of time that you wait before being seen by our physicians.       _____________________________________________________________  Should you have questions after your visit to [facility], please contact our office at [phone] between the hours of 8:30 a.m. and 4:30 p.m.  Voicemails left after 4:30 p.m. will not be returned until the following business day.  For prescription refill requests, have your pharmacy contact our office.       Resources For Cancer Patients and their Caregivers ? American Cancer Society: Can assist with transportation, wigs, general needs, runs Look Good Feel Better.        463-789-9467 ? Cancer Care: Provides financial assistance, online support groups, medication/co-pay assistance.  1-800-813-HOPE 646-293-5848) ? [facility] Assists [place] cancer patients and their families through emotional , educational and financial support.  (587)024-0181 ? [place] DSS Where to apply for food stamps, Medicaid and utility assistance. 214-553-9758 ? [facility]: Transportation to medical appointments. 909 402 4787 ? Social Security Administration: May apply for disability if have a Stage IV cancer. 6605365757 780-540-5314 ? [employer], Disability and Transit Services: Assists with nutrition, care and transit needs. [place] Support Programs: @10RELATIVEDAYS @ > Cancer Support Group  2nd Tuesday of the month 1pm-2pm, Journey Room  > Creative Journey  3rd Tuesday of the month 1130am-1pm, Journey Room  > Look Good Feel Better  1st Wednesday of the month 10am-12 noon, Journey Room (Call [facility] to register [phone])

## 2017-01-30 NOTE — Progress Notes (Signed)
[place], [place] [address]   CLINIC:  Medical Oncology/Hematology  PCP:  Sinda Du, MD [place] [address] [phone]   REASON FOR VISIT:  Follow-up for Low-grade myelodysplastic syndrome (MDS) AND vitamin B12 deficiency   CURRENT THERAPY: Observation AND monthly vitamin B12 injections (administered at home)     HISTORY OF PRESENT ILLNESS:  (From Dr. Laverle Patter note on 09/26/16)  Pancytopenia, Neutropenia Thrombocytopenia CKD, Stage 3 Low grade/low risk MDS, MDS with multilineage dysplasia B12 deficiency   INTERVAL HISTORY:  [name] 83 y.o. male returns for routine follow-up for low-grade MDS.   He is here with her wife of 57 years today.  Overall, he tells me he has been feeling well. Appetite 100%; energy levels 75%.  He endorses easy bruising to his extremities; denies abnormal bruising to his abdomen or chest. Denies any active bleeding episodes including blood in his stools, dark/tarry stools, hematuria, nosebleeds, or gingival bleeding.   He recently was diagnosed with shingles to his (R) side/abd/back.  He has completed his medications for the shingles and pain in this area is improving/resolving.   He continues to receive vitamin B12 injections at home; his wife administers them to him once per month.   Otherwise, he is largely without complaints today.  They have 2 sons, 3 grandsons, and several great-grandsons. They jokingly shared, "There are no girls in the family. We're carrying on our strong family name."   :)      REVIEW OF SYSTEMS:  Review of Systems  Constitutional: Positive for fatigue (occasional ). Negative for chills and fever.  HENT:  Negative.  Negative for lump/mass and nosebleeds.   Eyes: Negative.   Respiratory: Negative.  Negative for cough and shortness of breath.   Cardiovascular: Negative.  Negative for chest pain and leg swelling.  Gastrointestinal:  Negative.  Negative for abdominal pain, blood in stool, constipation, diarrhea, nausea and vomiting.  Endocrine: Negative.   Genitourinary: Negative.  Negative for dysuria and hematuria.   Musculoskeletal: Negative.  Negative for arthralgias.  Skin: Positive for itching and rash.       Recovering from shingles   Neurological: Negative.  Negative for dizziness and headaches.  Hematological: Negative for adenopathy. Bruises/bleeds easily.  Psychiatric/Behavioral: Negative.  Negative for depression and sleep disturbance. The patient is not nervous/anxious.      PAST MEDICAL/SURGICAL HISTORY:  Past Medical History:  Diagnosis Date  . BPH (benign prostatic hypertrophy)   . Chronic systolic heart failure ([place])   . Coronary artery disease    Multivessel status post CABG 8/06, LIMA-LAD, SVG-CFX, SVG-distal RCA  . Essential hypertension   . Hyperlipidemia   . ICD (implantable cardiac defibrillator) in place    Medtronic  . Ischemic cardiomyopathy   . Myelodysplasia, low grade ([place]) 11/27/2015  . Nephrolithiasis   . Prostate cancer ([place])   . Vitamin B 12 deficiency 07/27/2016   Past Surgical History:  Procedure Laterality Date  . CARDIAC DEFIBRILLATOR PLACEMENT    . COLONOSCOPY    . COLONOSCOPY N/A 08/19/2013   Procedure: COLONOSCOPY;  Surgeon: Rogene Houston, MD;  [facility];  Service: Endoscopy;  Laterality: N/A;  930  . CORONARY ARTERY BYPASS GRAFT     8/06 with left anterior descending artery, saphenous vein graft to circumflex, saphenous vein graft to distal right coronary artery  . HERNIA REPAIR    . POLYPECTOMY    . PROSTATE SURGERY  SOCIAL HISTORY:  Social History   Social History  . Marital status: Married    Spouse name: N/A  . Number of children: N/A  . Years of education: N/A   Occupational History  . Retired Retired   Social History Main Topics  . Smoking status: Former Smoker    Packs/day: 0.25    Years: 44.00    Types: Cigarettes    Quit  date: 05/14/1992  . Smokeless tobacco: Never Used  . Alcohol use No  . Drug use: No  . Sexual activity: Not on file     Comment: married 63 years   Other Topics Concern  . Not on file   Social History Narrative  . No narrative on file    FAMILY HISTORY:  Family History  Problem Relation Age of Onset  . Colon cancer Father   . Coronary artery disease Unknown     CURRENT MEDICATIONS:  Outpatient Encounter Prescriptions as of 01/30/2017  Medication Sig  . acetaminophen (TYLENOL) 500 MG tablet Take 1 tablet (500 mg total) by mouth every 6 (six) hours as needed for mild pain or fever.  Marland Kitchen allopurinol (ZYLOPRIM) 300 MG tablet Take 300 mg by mouth daily.   Marland Kitchen aspirin 81 MG chewable tablet Chew 81 mg by mouth daily.  . carvedilol (COREG) 12.5 MG tablet Take 12.5 mg by mouth 2 (two) times daily with a meal.  . cetirizine (ZYRTEC) 10 MG tablet Take 10 mg by mouth daily as needed for allergies.   . Cholecalciferol (VITAMIN D3) 1000 UNITS CAPS Take 1 capsule by mouth daily.   . cyanocobalamin (,VITAMIN B-12,) 1000 MCG/ML injection INJECT 1ML INTO THE MUSCLE ONCE AS DIRECTED  . diphenhydrAMINE (BENADRYL) 25 MG tablet Take 25 mg by mouth at bedtime as needed.  . hydrochlorothiazide (HYDRODIURIL) 25 MG tablet take 1/2 tablet by mouth once daily  . losartan (COZAAR) 50 MG tablet take 1 and 1/2 tablets by mouth once daily  . simvastatin (ZOCOR) 40 MG tablet TAKE ONE TABLET BY MOUTH EVERY EVENING  . spironolactone (ALDACTONE) 25 MG tablet take 1/2 tablet by mouth once daily  . vitamin C (ASCORBIC ACID) 500 MG tablet Take 500 mg by mouth daily.     No facility-administered encounter medications on file as of 01/30/2017.     ALLERGIES:  Allergies  Allergen Reactions  . Codeine Nausea And Vomiting     PHYSICAL EXAM:  ECOG Performance status: 1 - Symptomatic; remains independent   Vitals:   01/30/17 1344  BP: (!) 121/55  Pulse: 63  Resp: 18  Temp: 98.5 F (36.9 C)  SpO2: 96%   Filed  Weights   01/30/17 1344  Weight: 178 lb 1.6 oz (80.8 kg)    Physical Exam  Constitutional: He is oriented to person, place, and time and well-developed, well-nourished, and in no distress.  HENT:  Head: Normocephalic.  Mouth/Throat: Oropharynx is clear and moist. No oropharyngeal exudate.  Eyes: Pupils are equal, round, and reactive to light. Conjunctivae are normal. No scleral icterus.  Neck: Normal range of motion. Neck supple.  Cardiovascular: Normal rate and regular rhythm.   (L) upper chest wall pacemaker in place (non-functional per patient; he elected not to have it replaced)   Pulmonary/Chest: Effort normal and breath sounds normal. No respiratory distress. He has no wheezes.  Abdominal: Soft. Bowel sounds are normal. There is no tenderness.  Musculoskeletal: Normal range of motion. He exhibits no edema.  Lymphadenopathy:    He has no cervical adenopathy.  He has no axillary adenopathy.       Right: No supraclavicular adenopathy present.       Left: No supraclavicular adenopathy present.  Neurological: He is alert and oriented to person, place, and time. No cranial nerve deficit. Gait normal.  Skin: Skin is warm and dry. No rash noted.  Psychiatric: Mood, memory, affect and judgment normal.  Nursing note and vitals reviewed.    LABORATORY DATA:  I have reviewed the labs as listed.  CBC    Component Value Date/Time   WBC 3.5 (L) 01/30/2017 1319   RBC 3.71 (L) 01/30/2017 1319   HGB 11.8 (L) 01/30/2017 1319   HCT 35.8 (L) 01/30/2017 1319   PLT 103 (L) 01/30/2017 1319   MCV 96.5 01/30/2017 1319   MCH 31.8 01/30/2017 1319   MCHC 33.0 01/30/2017 1319   RDW 13.8 01/30/2017 1319   LYMPHSABS 0.9 01/30/2017 1319   MONOABS 1.0 01/30/2017 1319   EOSABS 0.0 01/30/2017 1319   BASOSABS 0.0 01/30/2017 1319   CMP Latest Ref Rng & Units 01/30/2017 01/17/2017 09/26/2016  Glucose 65 - 99 mg/dL 116(H) - 178(H)  BUN 6 - 20 mg/dL 29(H) - 35(H)  Creatinine 0.61 - 1.24 mg/dL 1.28(H)  1.40(H) 1.26(H)  Sodium 135 - 145 mmol/L 138 - 136  Potassium 3.5 - 5.1 mmol/L 4.6 - 4.6  Chloride 101 - 111 mmol/L 104 - 103  CO2 22 - 32 mmol/L 26 - 24  Calcium 8.9 - 10.3 mg/dL 9.5 - 9.1  Total Protein 6.5 - 8.1 g/dL 6.9 - 6.8  Total Bilirubin 0.3 - 1.2 mg/dL 0.6 - 0.9  Alkaline Phos 38 - 126 U/L 45 - 51  AST 15 - 41 U/L 15 - 17  ALT 17 - 63 U/L 12(L) - 11(L)    PENDING LABS:    DIAGNOSTIC IMAGING:    PATHOLOGY:  Peripheral Flow Cytometry: 09/30/15       Bone marrow biopsy: 10/27/15          ASSESSMENT & PLAN:   Low-grade myelodysplastic syndrome (MDS):  -Diagnosed in 10/2015.   -Pancytopenia remains stable. Today, WBCs 3.5 with ANC 1600; hemoglobin 11.8 g/dL. Platelets are actually improved from last visit 4 months ago and are 103,000 (up from 88,000 in 09/2016).  We reviewed his labs today in detail together today.  -Clinically, he continues to feel well. Denies any fevers or active bleeding episodes.   -Return to cancer center in 4 months for follow-up with labs.  Recommended they call us with any new or concerning symptoms, particularly abnormal bleeding/bruising or fevers. We can certainly see him sooner if needed. He agreed with this plan.   Vitamin B12 deficiency:  -Serum vitamin B12 level pending.  -Continue monthly B12 injections, as they may help some with erythropoiesis.    Chronic kidney disease:  -BUN/Creatinine stable at 29/1.28 today. We discussed the role that CKD has in anemia. I think the largest factor affecting his anemia is the MDS, but CKD could be contributing as well.  Will continue to monitor.   Health maintenance/Wellness promotion:  -Offered flu shot today; he declined. He prefers to wait until some time in October; he plans to get injection with his PCP which is reasonable.        Dispo:  -Return to cancer center in 4 months for follow-up with labs.    All questions were answered to patient's stated satisfaction. Encouraged  patient to call with any new concerns or questions before his next visit to  the cancer center and we can certain see him sooner, if needed.    Plan of care discussed with Dr. Talbert Cage, who agrees with the above aforementioned.    Orders placed this encounter:  Orders Placed This Encounter  Procedures  . CBC with Differential/Platelet  . Comprehensive metabolic panel  . Vitamin B12      Mike Craze, NP [place] [phone]

## 2017-03-01 DIAGNOSIS — Z23 Encounter for immunization: Secondary | ICD-10-CM | POA: Diagnosis not present

## 2017-05-24 ENCOUNTER — Inpatient Hospital Stay (HOSPITAL_COMMUNITY): Payer: Medicare Other | Attending: Oncology

## 2017-05-24 DIAGNOSIS — N183 Chronic kidney disease, stage 3 (moderate): Secondary | ICD-10-CM | POA: Insufficient documentation

## 2017-05-24 DIAGNOSIS — D696 Thrombocytopenia, unspecified: Secondary | ICD-10-CM | POA: Diagnosis not present

## 2017-05-24 DIAGNOSIS — D469 Myelodysplastic syndrome, unspecified: Secondary | ICD-10-CM | POA: Insufficient documentation

## 2017-05-24 DIAGNOSIS — D61818 Other pancytopenia: Secondary | ICD-10-CM

## 2017-05-24 DIAGNOSIS — E538 Deficiency of other specified B group vitamins: Secondary | ICD-10-CM | POA: Diagnosis not present

## 2017-05-24 DIAGNOSIS — I130 Hypertensive heart and chronic kidney disease with heart failure and stage 1 through stage 4 chronic kidney disease, or unspecified chronic kidney disease: Secondary | ICD-10-CM | POA: Insufficient documentation

## 2017-05-24 DIAGNOSIS — D4620 Refractory anemia with excess of blasts, unspecified: Secondary | ICD-10-CM

## 2017-05-24 LAB — COMPREHENSIVE METABOLIC PANEL
ALT: 11 U/L — AB (ref 17–63)
AST: 17 U/L (ref 15–41)
Albumin: 4.1 g/dL (ref 3.5–5.0)
Alkaline Phosphatase: 55 U/L (ref 38–126)
Anion gap: 10 (ref 5–15)
BILIRUBIN TOTAL: 0.6 mg/dL (ref 0.3–1.2)
BUN: 31 mg/dL — ABNORMAL HIGH (ref 6–20)
CALCIUM: 9.5 mg/dL (ref 8.9–10.3)
CO2: 24 mmol/L (ref 22–32)
CREATININE: 1.26 mg/dL — AB (ref 0.61–1.24)
Chloride: 103 mmol/L (ref 101–111)
GFR, EST AFRICAN AMERICAN: 57 mL/min — AB (ref >60)
GFR, EST NON AFRICAN AMERICAN: 49 mL/min — AB (ref >60)
Glucose, Bld: 152 mg/dL — ABNORMAL HIGH (ref 65–99)
Potassium: 4.8 mmol/L (ref 3.5–5.1)
Sodium: 137 mmol/L (ref 135–145)
TOTAL PROTEIN: 7.2 g/dL (ref 6.5–8.1)

## 2017-05-24 LAB — CBC WITH DIFFERENTIAL/PLATELET
BASOS ABS: 0 10*3/uL (ref 0.0–0.1)
Basophils Relative: 0 %
Eosinophils Absolute: 0 10*3/uL (ref 0.0–0.7)
Eosinophils Relative: 0 %
HEMATOCRIT: 36.6 % — AB (ref 39.0–52.0)
Hemoglobin: 11.9 g/dL — ABNORMAL LOW (ref 13.0–17.0)
LYMPHS ABS: 0.8 10*3/uL (ref 0.7–4.0)
LYMPHS PCT: 25 %
MCH: 31.4 pg (ref 26.0–34.0)
MCHC: 32.5 g/dL (ref 30.0–36.0)
MCV: 96.6 fL (ref 78.0–100.0)
MONO ABS: 1.1 10*3/uL — AB (ref 0.1–1.0)
Monocytes Relative: 36 %
NEUTROS ABS: 1.2 10*3/uL — AB (ref 1.7–7.7)
Neutrophils Relative %: 39 %
Platelets: 90 10*3/uL — ABNORMAL LOW (ref 150–400)
RBC: 3.79 MIL/uL — AB (ref 4.22–5.81)
RDW: 13.1 % (ref 11.5–15.5)
WBC: 3.1 10*3/uL — ABNORMAL LOW (ref 4.0–10.5)

## 2017-05-24 LAB — VITAMIN B12: VITAMIN B 12: 890 pg/mL (ref 180–914)

## 2017-05-28 ENCOUNTER — Other Ambulatory Visit: Payer: Self-pay | Admitting: *Deleted

## 2017-05-29 MED ORDER — SPIRONOLACTONE 25 MG PO TABS: 12.5000 mg | ORAL_TABLET | Freq: Every day | ORAL | 6 refills | Status: DC

## 2017-05-29 MED ORDER — LOSARTAN POTASSIUM 50 MG PO TABS: 75.0000 mg | ORAL_TABLET | Freq: Every day | ORAL | 6 refills | Status: DC

## 2017-05-31 ENCOUNTER — Other Ambulatory Visit: Payer: Self-pay

## 2017-05-31 ENCOUNTER — Encounter (HOSPITAL_COMMUNITY): Payer: Self-pay | Admitting: Internal Medicine

## 2017-05-31 ENCOUNTER — Inpatient Hospital Stay (HOSPITAL_BASED_OUTPATIENT_CLINIC_OR_DEPARTMENT_OTHER): Payer: Medicare Other | Admitting: Internal Medicine

## 2017-05-31 VITALS — BP 132/73 | HR 64 | Temp 97.7°F | Resp 18 | Wt 178.8 lb

## 2017-05-31 DIAGNOSIS — D696 Thrombocytopenia, unspecified: Secondary | ICD-10-CM | POA: Diagnosis not present

## 2017-05-31 DIAGNOSIS — D469 Myelodysplastic syndrome, unspecified: Secondary | ICD-10-CM | POA: Diagnosis not present

## 2017-05-31 DIAGNOSIS — E538 Deficiency of other specified B group vitamins: Secondary | ICD-10-CM | POA: Diagnosis not present

## 2017-05-31 DIAGNOSIS — N183 Chronic kidney disease, stage 3 (moderate): Secondary | ICD-10-CM | POA: Diagnosis not present

## 2017-05-31 DIAGNOSIS — I130 Hypertensive heart and chronic kidney disease with heart failure and stage 1 through stage 4 chronic kidney disease, or unspecified chronic kidney disease: Secondary | ICD-10-CM

## 2017-05-31 DIAGNOSIS — D4620 Refractory anemia with excess of blasts, unspecified: Secondary | ICD-10-CM

## 2017-05-31 NOTE — Progress Notes (Signed)
Chief Complaint: Returns for 35-month follow-up for low-grade myelodysplastic syndrome and vitamin B12 deficiency.   History of chronic kidney disease stage III.  Current therapy: observation and self-administered B12 injections monthly at home.   Interval history: [name] returns today with his wife he has been feeling well he is in good spirits denies any excess fatigue bleeding he does have some bruising but that is mild and limited to only the arms he has had no nosebleeds no gum bleeding no jaundice no cough shortness of breath he did have an episode of herpes zoster involving his right lower quadrant but that has since resolved he is gotten his first dose of Shing Rex for prevention scheduled to have a booster in 6 months through his primary care physician.His weight is stable appetite is normal he has had no difficulty urinating and has not had any fluid retention in the extremities.   Past Medical History:  Diagnosis Date  . BPH (benign prostatic hypertrophy)   . Chronic systolic heart failure ([place])   . Coronary artery disease    Multivessel status post CABG 8/06, LIMA-LAD, SVG-CFX, SVG-distal RCA  . Essential hypertension   . Hyperlipidemia   . ICD (implantable cardiac defibrillator) in place    Medtronic  . Ischemic cardiomyopathy   . Myelodysplasia, low grade ([place]) 11/27/2015  . Nephrolithiasis   . Prostate cancer ([place])   . Vitamin B 12 deficiency 07/27/2016   Social History   Tobacco Use  . Smoking status: Former Smoker    Packs/day: 0.25    Years: 44.00    Pack years: 11.00    Types: Cigarettes    Last attempt to quit: 05/14/1992    Years since quitting: 25.0  . Smokeless tobacco: Never Used  Substance Use Topics  . Alcohol use: No  . Drug use: No   Result review: His CBC today shows a hemoglobin sorry CBC from 111 shows a hemoglobin of 11.9 that is stable white cell count is low at 3.9 that is also stable when compared to his prior CBCs platelets today and on and  on 05/24/2017 compared to 103 at his last visit [place] is 1.2 his serum creatinine is stable at 1.26.   His medications have been reviewed and reconciled   Impression and plan -low-grade MDS stable counts, mild thrombocytopenia- no symptoms from it, no therapy.   -Chronic kidney disease stage III also perhaps contributing a little bit to anemia but the anemia is very mild and he is not     symptomatic from it.   -Continue observation return in 6 months. -History of B12 deficiency takes injections at home continue monthly supplementation as usual.

## 2017-05-31 NOTE — Patient Instructions (Signed)
[facility] at [facility] Discharge Instructions  RECOMMENDATIONS MADE BY THE CONSULTANT AND ANY TEST RESULTS WILL BE SENT TO YOUR REFERRING PHYSICIAN.  You were seen today by Dr. Peru  Thank you for choosing [facility] at [facility] to provide your oncology and hematology care.  To afford each patient quality time with our provider, please arrive at least 15 minutes before your scheduled appointment time.    If you have a lab appointment with the Cancer Center please come in thru the  Main Entrance and check in at the main information desk  You need to re-schedule your appointment should you arrive 10 or more minutes late.  We strive to give you quality time with our providers, and arriving late affects you and other patients whose appointments are after yours.  Also, if you no show three or more times for appointments you may be dismissed from the clinic at the providers discretion.     Again, thank you for choosing [facility].  Our hope is that these requests will decrease the amount of time that you wait before being seen by our physicians.       _____________________________________________________________  Should you have questions after your visit to [facility], please contact our office at [phone] between the hours of 8:30 a.m. and 4:30 p.m.  Voicemails left after 4:30 p.m. will not be returned until the following business day.  For prescription refill requests, have your pharmacy contact our office.       Resources For Cancer Patients and their Caregivers ? American Cancer Society: Can assist with transportation, wigs, general needs, runs Look Good Feel Better.        1-888-227-6333 ? Cancer Care: Provides financial assistance, online support groups, medication/co-pay assistance.  1-800-813-HOPE (4673) ? [facility] Assists [place] cancer patients and their families  through emotional , educational and financial support.  336-427-4357 ? [place] DSS Where to apply for food stamps, Medicaid and utility assistance. 336-342-1394 ? [facility]: Transportation to medical appointments. 336-347-2287 ? Social Security Administration: May apply for disability if have a Stage IV cancer. 336-342-7796 1-800-772-1213 ? [place] Aging, Disability and Transit Services: Assists with nutrition, care and transit needs. 336-349-2343  Cancer Center Support Programs: @10RELATIVEDAYS@ > Cancer Support Group  2nd Tuesday of the month 1pm-2pm, Journey Room  > Creative Journey  3rd Tuesday of the month 1130am-1pm, Journey Room  > Look Good Feel Better  1st Wednesday of the month 10am-12 noon, Journey Room (Call American Cancer Society to register 1-800-395-5775)     

## 2017-06-03 DIAGNOSIS — N183 Chronic kidney disease, stage 3 (moderate): Secondary | ICD-10-CM | POA: Diagnosis not present

## 2017-06-03 DIAGNOSIS — I25119 Atherosclerotic heart disease of native coronary artery with unspecified angina pectoris: Secondary | ICD-10-CM | POA: Diagnosis not present

## 2017-06-03 DIAGNOSIS — E785 Hyperlipidemia, unspecified: Secondary | ICD-10-CM | POA: Diagnosis not present

## 2017-06-03 DIAGNOSIS — I129 Hypertensive chronic kidney disease with stage 1 through stage 4 chronic kidney disease, or unspecified chronic kidney disease: Secondary | ICD-10-CM | POA: Diagnosis not present

## 2017-06-04 DIAGNOSIS — E785 Hyperlipidemia, unspecified: Secondary | ICD-10-CM | POA: Diagnosis not present

## 2017-06-04 DIAGNOSIS — N183 Chronic kidney disease, stage 3 (moderate): Secondary | ICD-10-CM | POA: Diagnosis not present

## 2017-06-04 DIAGNOSIS — I129 Hypertensive chronic kidney disease with stage 1 through stage 4 chronic kidney disease, or unspecified chronic kidney disease: Secondary | ICD-10-CM | POA: Diagnosis not present

## 2017-06-04 DIAGNOSIS — I25119 Atherosclerotic heart disease of native coronary artery with unspecified angina pectoris: Secondary | ICD-10-CM | POA: Diagnosis not present

## 2017-06-04 LAB — LIPID PANEL
Cholesterol: 130 (ref 0–200)
HDL: 36 (ref 35–70)
LDL Cholesterol: 73
LDl/HDL Ratio: 3.6
Triglycerides: 127 (ref 40–160)

## 2017-06-18 NOTE — Progress Notes (Signed)
Cardiology Office Note  Date: 06/19/2017   ID: [name], DOB [birth year], MRN [id number]  PCP: Sinda Du, MD  Primary Cardiologist: Rozann Lesches, MD   Chief Complaint  Patient presents with  . Coronary Artery Disease    History of Present Illness: [name] is an 84 y.o. male last seen in July 2018.  He is here today for a routine follow-up visit.  States that he has been doing very well overall, no angina symptoms or increasing dyspnea with typical activities.  We went over his medications which are listed below.  Cardiac regimen includes aspirin, Coreg, HCTZ, Cozaar, Aldactone, and Zocor.  He had recent lipids with Dr. Luan Pulling which we will request for review.  He has a history of Medtronic ICD placement per Dr. Lovena Le, generator was not replaced at [facility] following discussions and improvement in LVEF.  I personally reviewed his ECG today which shows sinus rhythm with right bundle branch block and left anterior fascicular block.  Past Medical History:  Diagnosis Date  . BPH (benign prostatic hypertrophy)   . Chronic systolic heart failure ([place])   . Coronary artery disease    Multivessel status post CABG 8/06, LIMA-LAD, SVG-CFX, SVG-distal RCA  . Essential hypertension   . Hyperlipidemia   . ICD (implantable cardiac defibrillator) in place    Medtronic  . Ischemic cardiomyopathy   . Myelodysplasia, low grade ([place]) 11/27/2015  . Nephrolithiasis   . Prostate cancer ([place])   . Vitamin B 12 deficiency 07/27/2016    Past Surgical History:  Procedure Laterality Date  . CARDIAC DEFIBRILLATOR PLACEMENT    . COLONOSCOPY    . COLONOSCOPY N/A 08/19/2013   Procedure: COLONOSCOPY;  Surgeon: Rogene Houston, MD;  [facility];  Service: Endoscopy;  Laterality: N/A;  930  . CORONARY ARTERY BYPASS GRAFT     8/06 with left anterior descending artery, saphenous vein graft to circumflex, saphenous vein graft to distal right coronary artery  . HERNIA REPAIR    .  POLYPECTOMY    . PROSTATE SURGERY      Current Outpatient Medications  Medication Sig Dispense Refill  . acetaminophen (TYLENOL) 500 MG tablet Take 1 tablet (500 mg total) by mouth every 6 (six) hours as needed for mild pain or fever. 30 tablet 0  . allopurinol (ZYLOPRIM) 300 MG tablet Take 300 mg by mouth daily.     Marland Kitchen aspirin 81 MG chewable tablet Chew 81 mg by mouth daily.    . carvedilol (COREG) 12.5 MG tablet Take 12.5 mg by mouth 2 (two) times daily with a meal.    . cetirizine (ZYRTEC) 10 MG tablet Take 10 mg by mouth daily as needed for allergies.     . Cholecalciferol (VITAMIN D3) 1000 UNITS CAPS Take 1 capsule by mouth daily.     . cyanocobalamin (,VITAMIN B-12,) 1000 MCG/ML injection INJECT 1ML INTO THE MUSCLE ONCE AS DIRECTED 1 mL 14  . diphenhydrAMINE (BENADRYL) 25 MG tablet Take 25 mg by mouth at bedtime as needed.    . hydrochlorothiazide (HYDRODIURIL) 25 MG tablet take 1/2 tablet by mouth once daily 45 tablet 2  . losartan (COZAAR) 50 MG tablet Take 1.5 tablets (75 mg total) by mouth daily. 45 tablet 6  . simvastatin (ZOCOR) 40 MG tablet TAKE ONE TABLET BY MOUTH EVERY EVENING 30 tablet 0  . spironolactone (ALDACTONE) 25 MG tablet Take 0.5 tablets (12.5 mg total) by mouth daily. 15 tablet 6  . vitamin C (ASCORBIC  ACID) 500 MG tablet Take 500 mg by mouth daily.       No current facility-administered medications for this visit.    Allergies:  Codeine   Social History: The patient  reports that he quit smoking about 25 years ago. His smoking use included cigarettes. He has a 11.00 pack-year smoking history. he has never used smokeless tobacco. He reports that he does not drink alcohol or use drugs.   ROS:  Please see the history of present illness. Otherwise, complete review of systems is positive for none.  All other systems are reviewed and negative.   Physical Exam: VS:  BP 140/70   Pulse 64   Ht '5\' 4"'  (1.626 m)   Wt 177 lb 3.2 oz (80.4 kg)   SpO2 95%   BMI 30.42 kg/m  , BMI Body mass index is 30.42 kg/m.  Wt Readings from Last 3 Encounters:  06/19/17 177 lb 3.2 oz (80.4 kg)  05/31/17 178 lb 12.8 oz (81.1 kg)  01/30/17 178 lb 1.6 oz (80.8 kg)    General: Elderly male, appears comfortable at rest. HEENT: Conjunctiva and lids normal, oropharynx clear. Neck: Supple, no elevated JVP, right carotid bruit, no thyromegaly. Lungs: Clear to auscultation, nonlabored breathing at rest. Cardiac: Regular rate and rhythm, no S3, soft systolic murmur. Abdomen: Soft, nontender, bowel sounds present, no guarding or rebound. Extremities: No pitting edema, distal pulses 2+. Skin: Warm and dry. Musculoskeletal: No kyphosis. Neuropsychiatric: Alert and oriented x3, affect grossly appropriate.  ECG: I personally reviewed the tracing from 05/18/2016 which showed sinus rhythm with PACs, right bundle branch block, left anterior fascicular block.  Recent Labwork: 05/24/2017: ALT 11; AST 17; BUN 31; Creatinine, Ser 1.26; Hemoglobin 11.9; Platelets 90; Potassium 4.8; Sodium 137   Other Studies Reviewed Today:  Carotid Dopplers 10/24/2016: Stable 1-39% bilateral ICA stenoses.  Assessment and Plan:  1.  Multivessel CAD status post CABG in 2006.  He is doing well without angina on medical therapy and remains comfortable with observation and no specific ischemic testing at this time.  ECG reviewed and stable.  2.  Ischemic cardiomyopathy with LVEF 45-50% range.  He still has a Medtronic ICD in place per Dr. Lovena Le that met ERI and did not have generator change after discussion.  3.  Mild bilateral carotid artery disease, stable by Dopplers last year.  Continue aspirin and statin.  4.  Mixed hyperlipidemia, follow-up on recent lipid panel.  He is on Zocor.  Current medicines were reviewed with the patient today.   Orders Placed This Encounter  Procedures  . EKG 12-Lead    Disposition: Follow-up in 6 months.  Signed, Satira Sark, MD, [facility] 06/19/2017 2:57 PM      [facility] at [facility] [address] [place], [place] [address] Phone: [phone]; Fax: [phone]

## 2017-06-19 ENCOUNTER — Ambulatory Visit (INDEPENDENT_AMBULATORY_CARE_PROVIDER_SITE_OTHER): Payer: Medicare Other | Admitting: Cardiology

## 2017-06-19 ENCOUNTER — Other Ambulatory Visit: Payer: Self-pay

## 2017-06-19 ENCOUNTER — Encounter: Payer: Self-pay | Admitting: *Deleted

## 2017-06-19 VITALS — BP 140/70 | HR 64 | Ht 64.0 in | Wt 177.2 lb

## 2017-06-19 DIAGNOSIS — I2510 Atherosclerotic heart disease of native coronary artery without angina pectoris: Secondary | ICD-10-CM | POA: Diagnosis not present

## 2017-06-19 DIAGNOSIS — I6523 Occlusion and stenosis of bilateral carotid arteries: Secondary | ICD-10-CM

## 2017-06-19 DIAGNOSIS — E782 Mixed hyperlipidemia: Secondary | ICD-10-CM

## 2017-06-19 DIAGNOSIS — I255 Ischemic cardiomyopathy: Secondary | ICD-10-CM

## 2017-06-19 NOTE — Patient Instructions (Signed)

## 2017-06-20 ENCOUNTER — Ambulatory Visit: Payer: Medicare Other | Admitting: Cardiology

## 2017-10-01 DIAGNOSIS — I129 Hypertensive chronic kidney disease with stage 1 through stage 4 chronic kidney disease, or unspecified chronic kidney disease: Secondary | ICD-10-CM | POA: Diagnosis not present

## 2017-10-01 DIAGNOSIS — I110 Hypertensive heart disease with heart failure: Secondary | ICD-10-CM | POA: Diagnosis not present

## 2017-10-01 DIAGNOSIS — I25119 Atherosclerotic heart disease of native coronary artery with unspecified angina pectoris: Secondary | ICD-10-CM | POA: Diagnosis not present

## 2017-10-01 DIAGNOSIS — N183 Chronic kidney disease, stage 3 (moderate): Secondary | ICD-10-CM | POA: Diagnosis not present

## 2017-11-12 ENCOUNTER — Other Ambulatory Visit: Payer: Self-pay

## 2017-11-12 MED ORDER — HYDROCHLOROTHIAZIDE 25 MG PO TABS: 12.5000 mg | ORAL_TABLET | Freq: Every day | ORAL | 2 refills | Status: DC

## 2017-11-19 ENCOUNTER — Other Ambulatory Visit (HOSPITAL_COMMUNITY): Payer: Self-pay

## 2017-11-19 DIAGNOSIS — D4620 Refractory anemia with excess of blasts, unspecified: Secondary | ICD-10-CM

## 2017-11-20 ENCOUNTER — Inpatient Hospital Stay (HOSPITAL_COMMUNITY): Payer: Medicare Other | Attending: Hematology

## 2017-11-20 DIAGNOSIS — D4620 Refractory anemia with excess of blasts, unspecified: Secondary | ICD-10-CM | POA: Diagnosis not present

## 2017-11-20 DIAGNOSIS — E538 Deficiency of other specified B group vitamins: Secondary | ICD-10-CM | POA: Diagnosis not present

## 2017-11-20 DIAGNOSIS — N189 Chronic kidney disease, unspecified: Secondary | ICD-10-CM | POA: Insufficient documentation

## 2017-11-20 DIAGNOSIS — D61818 Other pancytopenia: Secondary | ICD-10-CM | POA: Diagnosis not present

## 2017-11-20 LAB — CBC WITH DIFFERENTIAL/PLATELET
BASOS ABS: 0 10*3/uL (ref 0.0–0.1)
BASOS PCT: 0 %
Eosinophils Absolute: 0 10*3/uL (ref 0.0–0.7)
Eosinophils Relative: 0 %
HEMATOCRIT: 34.3 % — AB (ref 39.0–52.0)
HEMOGLOBIN: 11.1 g/dL — AB (ref 13.0–17.0)
LYMPHS PCT: 30 %
Lymphs Abs: 0.9 10*3/uL (ref 0.7–4.0)
MCH: 30.9 pg (ref 26.0–34.0)
MCHC: 32.4 g/dL (ref 30.0–36.0)
MCV: 95.5 fL (ref 78.0–100.0)
MONOS PCT: 29 %
Monocytes Absolute: 0.8 10*3/uL (ref 0.1–1.0)
NEUTROS PCT: 41 %
Neutro Abs: 1.2 10*3/uL — ABNORMAL LOW (ref 1.7–7.7)
Platelets: 85 10*3/uL — ABNORMAL LOW (ref 150–400)
RBC: 3.59 MIL/uL — ABNORMAL LOW (ref 4.22–5.81)
RDW: 13.3 % (ref 11.5–15.5)
WBC: 2.9 10*3/uL — ABNORMAL LOW (ref 4.0–10.5)

## 2017-11-20 LAB — COMPREHENSIVE METABOLIC PANEL
ALBUMIN: 3.9 g/dL (ref 3.5–5.0)
ALK PHOS: 47 U/L (ref 38–126)
ALT: 11 U/L (ref 0–44)
AST: 18 U/L (ref 15–41)
Anion gap: 7 (ref 5–15)
BILIRUBIN TOTAL: 0.8 mg/dL (ref 0.3–1.2)
BUN: 30 mg/dL — AB (ref 8–23)
CALCIUM: 8.8 mg/dL — AB (ref 8.9–10.3)
CO2: 23 mmol/L (ref 22–32)
Chloride: 106 mmol/L (ref 98–111)
Creatinine, Ser: 1.2 mg/dL (ref 0.61–1.24)
GFR calc Af Amer: 60 mL/min — ABNORMAL LOW (ref >60)
GFR, EST NON AFRICAN AMERICAN: 52 mL/min — AB (ref >60)
GLUCOSE: 185 mg/dL — AB (ref 70–99)
Potassium: 4.8 mmol/L (ref 3.5–5.1)
Sodium: 136 mmol/L (ref 135–145)
TOTAL PROTEIN: 6.7 g/dL (ref 6.5–8.1)

## 2017-11-20 LAB — VITAMIN B12: Vitamin B-12: 593 pg/mL (ref 180–914)

## 2017-11-22 ENCOUNTER — Other Ambulatory Visit: Payer: Self-pay | Admitting: *Deleted

## 2017-11-22 MED ORDER — SPIRONOLACTONE 25 MG PO TABS: 12.5000 mg | ORAL_TABLET | Freq: Every day | ORAL | 1 refills | Status: DC

## 2017-11-27 ENCOUNTER — Encounter (HOSPITAL_COMMUNITY): Payer: Self-pay | Admitting: Hematology

## 2017-11-27 ENCOUNTER — Inpatient Hospital Stay (HOSPITAL_BASED_OUTPATIENT_CLINIC_OR_DEPARTMENT_OTHER): Payer: Medicare Other | Admitting: Hematology

## 2017-11-27 ENCOUNTER — Other Ambulatory Visit: Payer: Self-pay

## 2017-11-27 VITALS — BP 123/51 | HR 65 | Temp 97.7°F | Resp 16 | Wt 178.4 lb

## 2017-11-27 DIAGNOSIS — D61818 Other pancytopenia: Secondary | ICD-10-CM | POA: Diagnosis not present

## 2017-11-27 DIAGNOSIS — N189 Chronic kidney disease, unspecified: Secondary | ICD-10-CM

## 2017-11-27 DIAGNOSIS — D4620 Refractory anemia with excess of blasts, unspecified: Secondary | ICD-10-CM

## 2017-11-27 DIAGNOSIS — E538 Deficiency of other specified B group vitamins: Secondary | ICD-10-CM | POA: Diagnosis not present

## 2017-11-27 DIAGNOSIS — D469 Myelodysplastic syndrome, unspecified: Secondary | ICD-10-CM

## 2017-11-27 NOTE — Progress Notes (Signed)
[place], [place] [address]   CLINIC:  Medical Oncology/Hematology  PCP:  Sinda Du, MD [place] [address] [phone]   REASON FOR VISIT:  Follow-up for low grade myelodysplastic syndrome (MDS) and vitamin B12 deficiency   CURRENT THERAPY: Observation and monthly B12 injections   INTERVAL HISTORY:  [name] 84 y.o. male returns for routine follow-up for low grade myelodysplastic syndrome and vitamin B12 deficiency. Patient is here today with his wife. Patient has been doing well. He continues to perform all his own ADLs and trys to stay active at home. His appetite remains good at 100%. His energy levels are 75%. Denies any fevers or night sweats. Denies any unexplained weight loss. Denies tingling or numbness in hands and feet. Denies any nausea, vominting, or diarrhea.    REVIEW OF SYSTEMS:  Review of Systems  Constitutional: Negative.   HENT:  Negative.   Eyes: Negative.   Respiratory: Negative.   Cardiovascular: Negative.   Gastrointestinal: Negative.   Endocrine: Negative.   Genitourinary: Negative.    Musculoskeletal:       Arthritis pain in joins  Skin: Positive for itching.  Neurological: Negative.   Hematological: Bruises/bleeds easily.     PAST MEDICAL/SURGICAL HISTORY:  Past Medical History:  Diagnosis Date  . BPH (benign prostatic hypertrophy)   . Chronic systolic heart failure ([place])   . Coronary artery disease    Multivessel status post CABG 8/06, LIMA-LAD, SVG-CFX, SVG-distal RCA  . Essential hypertension   . Hyperlipidemia   . ICD (implantable cardiac defibrillator) in place    Medtronic  . Ischemic cardiomyopathy   . Myelodysplasia, low grade ([place]) 11/27/2015  . Nephrolithiasis   . Prostate cancer ([place])   . Vitamin B 12 deficiency 07/27/2016   Past Surgical History:  Procedure Laterality Date  . CARDIAC DEFIBRILLATOR PLACEMENT    . COLONOSCOPY    . COLONOSCOPY  N/A 08/19/2013   Procedure: COLONOSCOPY;  Surgeon: Rogene Houston, MD;  [facility];  Service: Endoscopy;  Laterality: N/A;  930  . CORONARY ARTERY BYPASS GRAFT     8/06 with left anterior descending artery, saphenous vein graft to circumflex, saphenous vein graft to distal right coronary artery  . HERNIA REPAIR    . POLYPECTOMY    . PROSTATE SURGERY       SOCIAL HISTORY:  Social History   Socioeconomic History  . Marital status: Married    Spouse name: Not on file  . Number of children: Not on file  . Years of education: Not on file  . Highest education level: Not on file  Occupational History  . Occupation: Retired    Fish farm manager: RETIRED  Social Needs  . Financial resource strain: Not on file  . Food insecurity:    Worry: Not on file    Inability: Not on file  . Transportation needs:    Medical: Not on file    Non-medical: Not on file  Tobacco Use  . Smoking status: Former Smoker    Packs/day: 0.25    Years: 44.00    Pack years: 11.00    Types: Cigarettes    Last attempt to quit: 05/14/1992    Years since quitting: 25.5  . Smokeless tobacco: Never Used  Substance and Sexual Activity  . Alcohol use: No  . Drug use: No  . Sexual activity: Not on file    Comment: married 63 years  Lifestyle  .  Physical activity:    Days per week: Not on file    Minutes per session: Not on file  . Stress: Not on file  Relationships  . Social connections:    Talks on phone: Not on file    Gets together: Not on file    Attends religious service: Not on file    Active member of club or organization: Not on file    Attends meetings of clubs or organizations: Not on file    Relationship status: Not on file  . Intimate partner violence:    Fear of current or ex partner: Not on file    Emotionally abused: Not on file    Physically abused: Not on file    Forced sexual activity: Not on file  Other Topics Concern  . Not on file  Social History Narrative  . Not on file     FAMILY HISTORY:  Family History  Problem Relation Age of Onset  . Colon cancer Father   . Coronary artery disease Unknown     CURRENT MEDICATIONS:  Outpatient Encounter Medications as of 11/27/2017  Medication Sig  . acetaminophen (TYLENOL) 500 MG tablet Take 1 tablet (500 mg total) by mouth every 6 (six) hours as needed for mild pain or fever.  Marland Kitchen allopurinol (ZYLOPRIM) 300 MG tablet Take 300 mg by mouth daily.   Marland Kitchen aspirin 81 MG chewable tablet Chew 81 mg by mouth daily.  . carvedilol (COREG) 12.5 MG tablet Take 12.5 mg by mouth 2 (two) times daily with a meal.  . cetirizine (ZYRTEC) 10 MG tablet Take 10 mg by mouth daily as needed for allergies.   . Cholecalciferol (VITAMIN D3) 1000 UNITS CAPS Take 1 capsule by mouth daily.   . cyanocobalamin (,VITAMIN B-12,) 1000 MCG/ML injection INJECT 1ML INTO THE MUSCLE ONCE AS DIRECTED  . diphenhydrAMINE (BENADRYL) 25 MG tablet Take 25 mg by mouth at bedtime as needed.  . hydrochlorothiazide (HYDRODIURIL) 25 MG tablet Take 0.5 tablets (12.5 mg total) by mouth daily.  Marland Kitchen losartan (COZAAR) 50 MG tablet Take 1.5 tablets (75 mg total) by mouth daily.  . simvastatin (ZOCOR) 40 MG tablet TAKE ONE TABLET BY MOUTH EVERY EVENING  . spironolactone (ALDACTONE) 25 MG tablet Take 0.5 tablets (12.5 mg total) by mouth daily.  . vitamin C (ASCORBIC ACID) 500 MG tablet Take 500 mg by mouth daily.     No facility-administered encounter medications on file as of 11/27/2017.     ALLERGIES:  Allergies  Allergen Reactions  . Codeine Nausea And Vomiting     PHYSICAL EXAM:  ECOG Performance status: 1  Vitals:   11/27/17 1547  BP: (!) 123/51  Pulse: 65  Resp: 16  Temp: 97.7 F (36.5 C)  SpO2: 96%   Filed Weights   11/27/17 1547  Weight: 178 lb 6.4 oz (80.9 kg)    Physical Exam   LABORATORY DATA:  I have reviewed the labs as listed.  CBC    Component Value Date/Time   WBC 2.9 (L) 11/20/2017 1324   RBC 3.59 (L) 11/20/2017 1324   HGB 11.1  (L) 11/20/2017 1324   HCT 34.3 (L) 11/20/2017 1324   PLT 85 (L) 11/20/2017 1324   MCV 95.5 11/20/2017 1324   MCH 30.9 11/20/2017 1324   MCHC 32.4 11/20/2017 1324   RDW 13.3 11/20/2017 1324   LYMPHSABS 0.9 11/20/2017 1324   MONOABS 0.8 11/20/2017 1324   EOSABS 0.0 11/20/2017 1324   BASOSABS 0.0 11/20/2017 1324   CMP  Latest Ref Rng & Units 11/20/2017 05/24/2017 01/30/2017  Glucose 70 - 99 mg/dL 185(H) 152(H) 116(H)  BUN 8 - 23 mg/dL 30(H) 31(H) 29(H)  Creatinine 0.61 - 1.24 mg/dL 1.20 1.26(H) 1.28(H)  Sodium 135 - 145 mmol/L 136 137 138  Potassium 3.5 - 5.1 mmol/L 4.8 4.8 4.6  Chloride 98 - 111 mmol/L 106 103 104  CO2 22 - 32 mmol/L '23 24 26  ' Calcium 8.9 - 10.3 mg/dL 8.8(L) 9.5 9.5  Total Protein 6.5 - 8.1 g/dL 6.7 7.2 6.9  Total Bilirubin 0.3 - 1.2 mg/dL 0.8 0.6 0.6  Alkaline Phos 38 - 126 U/L 47 55 45  AST 15 - 41 U/L '18 17 15  ' ALT 0 - 44 U/L 11 11(L) 12(L)        ASSESSMENT & PLAN:   Myelodysplasia, low grade (HCC) 1.  Low-grade MDS: - He has pancytopenia from it.  Bone marrow biopsy on 10/27/2015 showed hypercellular marrow with mild dyserythropoiesis and dysmegakaryopoiesis.  Chromosome analysis showed 62 XY with a normal fish panel.  Flow cytometry on peripheral blood on 5 92,017 did not show any monoclonal B-cell population. - He has normocytic anemia which is around 11.  Mild thrombocytopenia is also stable around 80-90.  Mild leukopenia is also stable with ANC around 1200.  No recurrent infections.  No easy bleeding.  We will see him back in 6 months for follow-up with repeat blood work.  We will also check ferritin and iron panel prior to next visit.  2.  B12 deficiency: -Continue B12 monthly injections at home.  Recent B12 level was within normal limits.  3.  CKD: - His creatinine is mildly elevated around 1.2-1.3.  This has been stable.      Orders placed this encounter:  Orders Placed This Encounter  Procedures  . CBC with Differential/Platelet  .  Comprehensive metabolic panel  . Ferritin  . Iron and TIBC  . Lactate dehydrogenase  . Vitamin B12      Derek Jack, [place] [phone]

## 2017-11-27 NOTE — Assessment & Plan Note (Signed)
1.  Low-grade MDS: - He has pancytopenia from it.  Bone marrow biopsy on 10/27/2015 showed hypercellular marrow with mild dyserythropoiesis and dysmegakaryopoiesis.  Chromosome analysis showed 63 XY with a normal fish panel.  Flow cytometry on peripheral blood on 5 92,017 did not show any monoclonal B-cell population. - He has normocytic anemia which is around 11.  Mild thrombocytopenia is also stable around 80-90.  Mild leukopenia is also stable with ANC around 1200.  No recurrent infections.  No easy bleeding.  We will see him back in 6 months for follow-up with repeat blood work.  We will also check ferritin and iron panel prior to next visit.  2.  B12 deficiency: -Continue B12 monthly injections at home.  Recent B12 level was within normal limits.  3.  CKD: - His creatinine is mildly elevated around 1.2-1.3.  This has been stable.

## 2017-12-02 ENCOUNTER — Other Ambulatory Visit (HOSPITAL_COMMUNITY): Payer: Self-pay | Admitting: *Deleted

## 2017-12-02 DIAGNOSIS — D61818 Other pancytopenia: Secondary | ICD-10-CM

## 2017-12-02 MED ORDER — CYANOCOBALAMIN 1000 MCG/ML IJ SOLN: INTRAMUSCULAR | 14 refills | Status: DC

## 2017-12-24 NOTE — Progress Notes (Signed)
Cardiology Office Note  Date: 12/25/2017   ID: [name], DOB [birth year], MRN [id number]  PCP: Sinda Du, MD  Primary Cardiologist: Rozann Lesches, MD   Chief Complaint  Patient presents with  . Coronary Artery Disease    History of Present Illness: [name] is an 84 y.o. male last seen in February.  He is here today with his wife for a follow-up visit.  He reports no angina symptoms or worsening shortness of breath with typical ADLs.  He has had no palpitations or syncope.  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  He has a history of Medtronic ICD placement per Dr. Lovena Le, generator was not replaced at [facility] following discussions and improvement in LVEF.  I reviewed his medications.  Cardiac regimen includes aspirin, Coreg, HCTZ, Cozaar, Aldactone, and Zocor.  Recent lab work is reviewed below.  Past Medical History:  Diagnosis Date  . BPH (benign prostatic hypertrophy)   . Chronic systolic heart failure ([place])   . Coronary artery disease    Multivessel status post CABG 8/06, LIMA-LAD, SVG-CFX, SVG-distal RCA  . Essential hypertension   . Hyperlipidemia   . ICD (implantable cardiac defibrillator) in place    Medtronic  . Ischemic cardiomyopathy   . Myelodysplasia, low grade ([place]) 11/27/2015  . Nephrolithiasis   . Prostate cancer ([place])   . Vitamin B 12 deficiency 07/27/2016    Past Surgical History:  Procedure Laterality Date  . CARDIAC DEFIBRILLATOR PLACEMENT    . COLONOSCOPY    . COLONOSCOPY N/A 08/19/2013   Procedure: COLONOSCOPY;  Surgeon: Rogene Houston, MD;  [facility];  Service: Endoscopy;  Laterality: N/A;  930  . CORONARY ARTERY BYPASS GRAFT     8/06 with left anterior descending artery, saphenous vein graft to circumflex, saphenous vein graft to distal right coronary artery  . HERNIA REPAIR    . POLYPECTOMY    . PROSTATE SURGERY      Current Outpatient Medications    Medication Sig Dispense Refill  . acetaminophen (TYLENOL) 500 MG tablet Take 1 tablet (500 mg total) by mouth every 6 (six) hours as needed for mild pain or fever. 30 tablet 0  . allopurinol (ZYLOPRIM) 300 MG tablet Take 300 mg by mouth daily.     Marland Kitchen aspirin 81 MG chewable tablet Chew 81 mg by mouth daily.    . carvedilol (COREG) 12.5 MG tablet Take 12.5 mg by mouth 2 (two) times daily with a meal.    . cetirizine (ZYRTEC) 10 MG tablet Take 10 mg by mouth daily as needed for allergies.     . Cholecalciferol (VITAMIN D3) 1000 UNITS CAPS Take 1 capsule by mouth daily.     . cyanocobalamin (,VITAMIN B-12,) 1000 MCG/ML injection INJECT 1ML INTO THE MUSCLE ONCE AS DIRECTED 1 mL 14  . diphenhydrAMINE (BENADRYL) 25 MG tablet Take 25 mg by mouth at bedtime as needed.    . hydrochlorothiazide (HYDRODIURIL) 25 MG tablet Take 0.5 tablets (12.5 mg total) by mouth daily. 45 tablet 2  . losartan (COZAAR) 50 MG tablet Take 1.5 tablets (75 mg total) by mouth daily. 45 tablet 6  . simvastatin (ZOCOR) 40 MG tablet TAKE ONE TABLET BY MOUTH EVERY EVENING 30 tablet 0  . spironolactone (ALDACTONE) 25 MG tablet Take 0.5 tablets (12.5 mg total) by mouth daily. 45 tablet 1  . vitamin C (ASCORBIC ACID) 500 MG tablet Take 500 mg by mouth  daily.       No current facility-administered medications for this visit.    Allergies:  Codeine   Social History: The patient  reports that he quit smoking about 25 years ago. His smoking use included cigarettes. He has a 11.00 pack-year smoking history. He has never used smokeless tobacco. He reports that he does not drink alcohol or use drugs.   ROS:  Please see the history of present illness. Otherwise, complete review of systems is positive for hearing loss.  All other systems are reviewed and negative.   Physical Exam: VS:  BP 124/66   Pulse 64   Ht '5\' 4"'  (1.626 m)   Wt 177 lb 6.4 oz (80.5 kg)   SpO2 94%   BMI 30.45 kg/m , BMI Body mass index is 30.45 kg/m.  Wt Readings  from Last 3 Encounters:  12/25/17 177 lb 6.4 oz (80.5 kg)  11/27/17 178 lb 6.4 oz (80.9 kg)  06/19/17 177 lb 3.2 oz (80.4 kg)    General: Elderly male, appears comfortable at rest. HEENT: Conjunctiva and lids normal, oropharynx clear. Neck: Supple, no elevated JVP or carotid bruits, no thyromegaly. Lungs: Clear to auscultation, nonlabored breathing at rest. Cardiac: Regular rate and rhythm, no S3, soft systolic murmur. Abdomen: Soft, nontender, bowel sounds present. Extremities: No pitting edema, distal pulses 2+. Skin: Warm and dry. Musculoskeletal: Kyphosis noted. Neuropsychiatric: Alert and oriented x3, affect grossly appropriate.  ECG: I personally reviewed the tracing from 06/19/2017 which showed sinus rhythm with right bundle branch block and left anterior fascicular block.  Recent Labwork: 11/20/2017: ALT 11; AST 18; BUN 30; Creatinine, Ser 1.20; Hemoglobin 11.1; Platelets 85; Potassium 4.8; Sodium 136     Component Value Date/Time   CHOL 122 09/09/2014 1123   TRIG 125.0 09/09/2014 1123   HDL 32.80 (L) 09/09/2014 1123   CHOLHDL 4 09/09/2014 1123   VLDL 25.0 09/09/2014 1123   LDLCALC 64 09/09/2014 1123    Other Studies Reviewed Today:  Carotid Dopplers 10/24/2016: Stable 1-39% bilateral ICA stenoses.  Assessment and Plan:  1.  Multivessel CAD status post CABG in 2006.  We continue with medical therapy and observation in the absence of progressive angina symptoms.  2.  Ischemic cardiomyopathy with improvement in LVEF, 45 to 50% range.  Patient does have a Medtronic ICD in place however this device met ERI and generator was not changed out following discussion with Dr. Lovena Le.  He reports no palpitations or syncope.  3.  Mixed hyperlipidemia, continues on Zocor.  Last LDL 64.  Current medicines were reviewed with the patient today.  Disposition: Follow-up in 6 months.  Signed, Satira Sark, MD, [facility] 12/25/2017 11:50 AM    [facility] at  [place]. [address], [place], [place] [address] Phone: [phone]; Fax: [phone]

## 2017-12-25 ENCOUNTER — Encounter: Payer: Self-pay | Admitting: Cardiology

## 2017-12-25 ENCOUNTER — Ambulatory Visit (INDEPENDENT_AMBULATORY_CARE_PROVIDER_SITE_OTHER): Payer: Medicare Other | Admitting: Cardiology

## 2017-12-25 VITALS — BP 124/66 | HR 64 | Ht 64.0 in | Wt 177.4 lb

## 2017-12-25 DIAGNOSIS — I25119 Atherosclerotic heart disease of native coronary artery with unspecified angina pectoris: Secondary | ICD-10-CM

## 2017-12-25 DIAGNOSIS — E782 Mixed hyperlipidemia: Secondary | ICD-10-CM | POA: Diagnosis not present

## 2017-12-25 DIAGNOSIS — I255 Ischemic cardiomyopathy: Secondary | ICD-10-CM | POA: Diagnosis not present

## 2017-12-25 NOTE — Patient Instructions (Signed)

## 2017-12-30 ENCOUNTER — Other Ambulatory Visit: Payer: Self-pay

## 2017-12-30 MED ORDER — LOSARTAN POTASSIUM 50 MG PO TABS: 75.0000 mg | ORAL_TABLET | Freq: Every day | ORAL | 6 refills | Status: DC

## 2018-01-29 DIAGNOSIS — L570 Actinic keratosis: Secondary | ICD-10-CM | POA: Diagnosis not present

## 2018-01-29 DIAGNOSIS — Z85828 Personal history of other malignant neoplasm of skin: Secondary | ICD-10-CM | POA: Diagnosis not present

## 2018-01-29 DIAGNOSIS — L821 Other seborrheic keratosis: Secondary | ICD-10-CM | POA: Diagnosis not present

## 2018-01-30 DIAGNOSIS — I10 Essential (primary) hypertension: Secondary | ICD-10-CM | POA: Diagnosis not present

## 2018-01-30 DIAGNOSIS — N183 Chronic kidney disease, stage 3 (moderate): Secondary | ICD-10-CM | POA: Diagnosis not present

## 2018-01-30 DIAGNOSIS — I509 Heart failure, unspecified: Secondary | ICD-10-CM | POA: Diagnosis not present

## 2018-01-30 DIAGNOSIS — I25119 Atherosclerotic heart disease of native coronary artery with unspecified angina pectoris: Secondary | ICD-10-CM | POA: Diagnosis not present

## 2018-02-19 DIAGNOSIS — Z23 Encounter for immunization: Secondary | ICD-10-CM | POA: Diagnosis not present

## 2018-06-02 ENCOUNTER — Inpatient Hospital Stay (HOSPITAL_COMMUNITY): Payer: Medicare Other | Attending: Family Medicine

## 2018-06-02 DIAGNOSIS — D4620 Refractory anemia with excess of blasts, unspecified: Secondary | ICD-10-CM | POA: Diagnosis not present

## 2018-06-02 DIAGNOSIS — D61818 Other pancytopenia: Secondary | ICD-10-CM | POA: Insufficient documentation

## 2018-06-02 DIAGNOSIS — E538 Deficiency of other specified B group vitamins: Secondary | ICD-10-CM | POA: Diagnosis not present

## 2018-06-02 DIAGNOSIS — N189 Chronic kidney disease, unspecified: Secondary | ICD-10-CM | POA: Diagnosis not present

## 2018-06-02 DIAGNOSIS — D469 Myelodysplastic syndrome, unspecified: Secondary | ICD-10-CM

## 2018-06-02 LAB — CBC WITH DIFFERENTIAL/PLATELET
ABS IMMATURE GRANULOCYTES: 0.01 10*3/uL (ref 0.00–0.07)
BASOS PCT: 0 %
Basophils Absolute: 0 10*3/uL (ref 0.0–0.1)
Eosinophils Absolute: 0 10*3/uL (ref 0.0–0.5)
Eosinophils Relative: 0 %
HCT: 35.8 % — ABNORMAL LOW (ref 39.0–52.0)
HEMOGLOBIN: 11.4 g/dL — AB (ref 13.0–17.0)
Immature Granulocytes: 0 %
LYMPHS PCT: 24 %
Lymphs Abs: 0.7 10*3/uL (ref 0.7–4.0)
MCH: 31.3 pg (ref 26.0–34.0)
MCHC: 31.8 g/dL (ref 30.0–36.0)
MCV: 98.4 fL (ref 80.0–100.0)
MONO ABS: 0.8 10*3/uL (ref 0.1–1.0)
Monocytes Relative: 29 %
NEUTROS ABS: 1.3 10*3/uL — AB (ref 1.7–7.7)
NEUTROS PCT: 47 %
NRBC: 0 % (ref 0.0–0.2)
Platelets: 91 10*3/uL — ABNORMAL LOW (ref 150–400)
RBC: 3.64 MIL/uL — AB (ref 4.22–5.81)
RDW: 13.2 % (ref 11.5–15.5)
WBC: 2.7 10*3/uL — AB (ref 4.0–10.5)

## 2018-06-02 LAB — COMPREHENSIVE METABOLIC PANEL
ALT: 10 U/L (ref 0–44)
AST: 13 U/L — ABNORMAL LOW (ref 15–41)
Albumin: 4.1 g/dL (ref 3.5–5.0)
Alkaline Phosphatase: 42 U/L (ref 38–126)
Anion gap: 7 (ref 5–15)
BILIRUBIN TOTAL: 0.7 mg/dL (ref 0.3–1.2)
BUN: 29 mg/dL — ABNORMAL HIGH (ref 8–23)
CO2: 27 mmol/L (ref 22–32)
Calcium: 9.2 mg/dL (ref 8.9–10.3)
Chloride: 104 mmol/L (ref 98–111)
Creatinine, Ser: 1.17 mg/dL (ref 0.61–1.24)
GFR calc Af Amer: >60 mL/min (ref >60)
GFR, EST NON AFRICAN AMERICAN: 55 mL/min — AB (ref >60)
Glucose, Bld: 112 mg/dL — ABNORMAL HIGH (ref 70–99)
Potassium: 4.4 mmol/L (ref 3.5–5.1)
Sodium: 138 mmol/L (ref 135–145)
Total Protein: 7 g/dL (ref 6.5–8.1)

## 2018-06-02 LAB — IRON AND TIBC
IRON: 100 ug/dL (ref 45–182)
SATURATION RATIOS: 32 % (ref 17.9–39.5)
TIBC: 309 ug/dL (ref 250–450)
UIBC: 209 ug/dL

## 2018-06-02 LAB — LACTATE DEHYDROGENASE: LDH: 181 U/L (ref 98–192)

## 2018-06-02 LAB — FERRITIN: FERRITIN: 80 ng/mL (ref 24–336)

## 2018-06-02 LAB — VITAMIN B12: Vitamin B-12: 625 pg/mL (ref 180–914)

## 2018-06-09 ENCOUNTER — Inpatient Hospital Stay (HOSPITAL_BASED_OUTPATIENT_CLINIC_OR_DEPARTMENT_OTHER): Payer: Medicare Other | Admitting: Hematology

## 2018-06-09 ENCOUNTER — Encounter (HOSPITAL_COMMUNITY): Payer: Self-pay | Admitting: Hematology

## 2018-06-09 ENCOUNTER — Other Ambulatory Visit: Payer: Self-pay

## 2018-06-09 VITALS — BP 129/53 | HR 61 | Temp 97.8°F | Resp 16 | Wt 180.5 lb

## 2018-06-09 DIAGNOSIS — D469 Myelodysplastic syndrome, unspecified: Secondary | ICD-10-CM

## 2018-06-09 DIAGNOSIS — E538 Deficiency of other specified B group vitamins: Secondary | ICD-10-CM | POA: Diagnosis not present

## 2018-06-09 DIAGNOSIS — D4620 Refractory anemia with excess of blasts, unspecified: Secondary | ICD-10-CM

## 2018-06-09 DIAGNOSIS — N189 Chronic kidney disease, unspecified: Secondary | ICD-10-CM | POA: Diagnosis not present

## 2018-06-09 DIAGNOSIS — D61818 Other pancytopenia: Secondary | ICD-10-CM | POA: Diagnosis not present

## 2018-06-09 DIAGNOSIS — D46Z Other myelodysplastic syndromes: Secondary | ICD-10-CM

## 2018-06-09 NOTE — Assessment & Plan Note (Signed)
1.  Low-grade MDS: - He has pancytopenia from it.  Bone marrow biopsy on 10/27/2015 showed hypercellular marrow with mild dyserythropoiesis and dysmegakaryopoiesis.  Chromosome analysis showed 107 XY with a normal FISH panel.  Flow cytometry on peripheral blood on 09/20/2015 did not show any monoclonal B-cell population. - He has normocytic anemia with hemoglobin between 11 and 12 which is stable.  White count is also stable around 2.7 with an [place] of 1300.  Mild thrombocytopenia is also stable between 80-90. - He did not have any B symptoms.  He did not have any infections in the last 6 months.  No hospitalizations noted. -We will see him back in 6 months for follow-up with repeat blood counts.  2.  B12 deficiency: -We checked his B12 levels which were normal.  We will continue B12 monthly injections at home.   3.  CKD: - His creatinine was mildly elevated in the past around 1.2-1.3.  However it has normalized the last 2 times.

## 2018-06-09 NOTE — Progress Notes (Signed)
[name], [name]   CLINIC:  Medical Oncology/Hematology  PCP:  Sinda Du, MD [address] [place] [address] [phone]   REASON FOR VISIT: Follow-up for low grade myelodysplastic syndrome (MDS) and vitamin B12 deficiency   CURRENT THERAPY: Observation and monthly B12 injections  INTERVAL HISTORY:  [name] 85 y.o. male returns for routine follow-up for low grade MDS and B 12 deficiency. He is here today with his wife and is doing well. He has no complaints today. He denies any fevers, chills, night sweats, or unexplained weight loss. Denies any nausea, vomiting, or diarrhea. Denies any new pains. Had not noticed any recent bleeding such as epistaxis, hematuria or hematochezia. Denies recent chest pain on exertion, shortness of breath on minimal exertion, pre-syncopal episodes, or palpitations. Denies any numbness or tingling in hands or feet. Denies any recent fevers, infections, or recent hospitalizations. Patient reports appetite at 100% and energy level at 75%.   REVIEW OF SYSTEMS:  Review of Systems  All other systems reviewed and are negative.    PAST MEDICAL/SURGICAL HISTORY:  Past Medical History:  Diagnosis Date  . BPH (benign prostatic hypertrophy)   . Chronic systolic heart failure ([place])   . Coronary artery disease    Multivessel status post CABG 8/06, LIMA-LAD, SVG-CFX, SVG-distal RCA  . Essential hypertension   . Hyperlipidemia   . ICD (implantable cardiac defibrillator) in place    Medtronic  . Ischemic cardiomyopathy   . Myelodysplasia, low grade ([place]) 11/27/2015  . Nephrolithiasis   . Prostate cancer ([place])   . Vitamin B 12 deficiency 07/27/2016   Past Surgical History:  Procedure Laterality Date  . CARDIAC DEFIBRILLATOR PLACEMENT    . COLONOSCOPY    . COLONOSCOPY N/A 08/19/2013   Procedure: COLONOSCOPY;  Surgeon: Rogene Houston, MD;  Location[facility];  Service: Endoscopy;   Laterality: N/A;  930  . CORONARY ARTERY BYPASS GRAFT     8/06 with left anterior descending artery, saphenous vein graft to circumflex, saphenous vein graft to distal right coronary artery  . HERNIA REPAIR    . POLYPECTOMY    . PROSTATE SURGERY       SOCIAL HISTORY:  Social History   Socioeconomic History  . Marital status: Married    Spouse name: Not on file  . Number of children: Not on file  . Years of education: Not on file  . Highest education level: Not on file  Occupational History  . Occupation: Retired    Fish farm manager: RETIRED  Social Needs  . Financial resource strain: Not on file  . Food insecurity:    Worry: Not on file    Inability: Not on file  . Transportation needs:    Medical: Not on file    Non-medical: Not on file  Tobacco Use  . Smoking status: Former Smoker    Packs/day: 0.25    Years: 44.00    Pack years: 11.00    Types: Cigarettes    Last attempt to quit: 05/14/1992    Years since quitting: 26.0  . Smokeless tobacco: Never Used  Substance and Sexual Activity  . Alcohol use: No  . Drug use: No  . Sexual activity: Not on file    Comment: married 63 years  Lifestyle  . Physical activity:    Days per week: Not on file    Minutes per session: Not on file  . Stress: Not on file  Relationships  .  Social connections:    Talks on phone: Not on file    Gets together: Not on file    Attends religious service: Not on file    Active member of club or organization: Not on file    Attends meetings of clubs or organizations: Not on file    Relationship status: Not on file  . Intimate partner violence:    Fear of current or ex partner: Not on file    Emotionally abused: Not on file    Physically abused: Not on file    Forced sexual activity: Not on file  Other Topics Concern  . Not on file  Social History Narrative  . Not on file    FAMILY HISTORY:  Family History  Problem Relation Age of Onset  . Colon cancer Father   . Coronary artery disease  Unknown     CURRENT MEDICATIONS:  Outpatient Encounter Medications as of 06/09/2018  Medication Sig  . acetaminophen (TYLENOL) 500 MG tablet Take 1 tablet (500 mg total) by mouth every 6 (six) hours as needed for mild pain or fever.  Marland Kitchen allopurinol (ZYLOPRIM) 300 MG tablet Take 300 mg by mouth daily.   Marland Kitchen aspirin 81 MG chewable tablet Chew 81 mg by mouth daily.  . carvedilol (COREG) 12.5 MG tablet Take 12.5 mg by mouth 2 (two) times daily with a meal.  . Cholecalciferol (VITAMIN D3) 1000 UNITS CAPS Take 1 capsule by mouth daily.   . cyanocobalamin (,VITAMIN B-12,) 1000 MCG/ML injection INJECT 1ML INTO THE MUSCLE ONCE AS DIRECTED  . diphenhydrAMINE (BENADRYL) 25 MG tablet Take 25 mg by mouth at bedtime as needed.  . hydrochlorothiazide (HYDRODIURIL) 25 MG tablet Take 0.5 tablets (12.5 mg total) by mouth daily.  Marland Kitchen losartan (COZAAR) 50 MG tablet Take 1.5 tablets (75 mg total) by mouth daily.  . simvastatin (ZOCOR) 40 MG tablet TAKE ONE TABLET BY MOUTH EVERY EVENING  . spironolactone (ALDACTONE) 25 MG tablet Take 0.5 tablets (12.5 mg total) by mouth daily.  . vitamin C (ASCORBIC ACID) 500 MG tablet Take 500 mg by mouth daily.    . cetirizine (ZYRTEC) 10 MG tablet Take 10 mg by mouth daily as needed for allergies.    No facility-administered encounter medications on file as of 06/09/2018.     ALLERGIES:  Allergies  Allergen Reactions  . Codeine Nausea And Vomiting     PHYSICAL EXAM:  ECOG Performance status: 1  Vitals:   06/09/18 1100  BP: (!) 129/53  Pulse: 61  Resp: 16  Temp: 97.8 F (36.6 C)  SpO2: 98%   Filed Weights   06/09/18 1100  Weight: 180 lb 8 oz (81.9 kg)    Physical Exam Constitutional:      Appearance: Normal appearance. He is normal weight.  Musculoskeletal: Normal range of motion.  Skin:    General: Skin is warm and dry.  Neurological:     General: No focal deficit present.     Mental Status: He is alert and oriented to person, place, and time. Mental  status is at baseline.  Psychiatric:        Mood and Affect: Mood normal.        Behavior: Behavior normal.        Thought Content: Thought content normal.        Judgment: Judgment normal.    No adenopathy in the neck, groin or axillary regions.  No palpable splenomegaly. Chest is bilaterally clear to auscultation. Cardiovascular: S1-S2 regular rate and rhythm.  Extremities: No edema or cyanosis.  LABORATORY DATA:  I have reviewed the labs as listed.  CBC    Component Value Date/Time   WBC 2.7 (L) 06/02/2018 1101   RBC 3.64 (L) 06/02/2018 1101   HGB 11.4 (L) 06/02/2018 1101   HCT 35.8 (L) 06/02/2018 1101   PLT 91 (L) 06/02/2018 1101   MCV 98.4 06/02/2018 1101   MCH 31.3 06/02/2018 1101   MCHC 31.8 06/02/2018 1101   RDW 13.2 06/02/2018 1101   LYMPHSABS 0.7 06/02/2018 1101   MONOABS 0.8 06/02/2018 1101   EOSABS 0.0 06/02/2018 1101   BASOSABS 0.0 06/02/2018 1101   CMP Latest Ref Rng & Units 06/02/2018 11/20/2017 05/24/2017  Glucose 70 - 99 mg/dL 112(H) 185(H) 152(H)  BUN 8 - 23 mg/dL 29(H) 30(H) 31(H)  Creatinine 0.61 - 1.24 mg/dL 1.17 1.20 1.26(H)  Sodium 135 - 145 mmol/L 138 136 137  Potassium 3.5 - 5.1 mmol/L 4.4 4.8 4.8  Chloride 98 - 111 mmol/L 104 106 103  CO2 22 - 32 mmol/L _0 Calcium 8.9 - 10.3 mg/dL 9.2 8.8(L) 9.5  Total Protein 6.5 - 8.1 g/dL 7.0 6.7 7.2  Total Bilirubin 0.3 - 1.2 mg/dL 0.7 0.8 0.6  Alkaline Phos 38 - 126 U/L 42 47 55  AST 15 - 41 U/L 13(L) 18 17  ALT 0 - 44 U/L 10 11 11(L)       DIAGNOSTIC IMAGING:  I have independently reviewed the scans and discussed with the patient.   I have reviewed [name], NP's note and agree with the documentation.  I personally performed a face-to-face visit, made revisions and my assessment and plan is as follows.    ASSESSMENT & PLAN:   Myelodysplasia, low grade ([place]) 1.  Low-grade MDS: - He has pancytopenia from it.  Bone marrow biopsy on 10/27/2015 showed hypercellular marrow with mild  dyserythropoiesis and dysmegakaryopoiesis.  Chromosome analysis showed 84 XY with a normal FISH panel.  Flow cytometry on peripheral blood on 09/20/2015 did not show any monoclonal B-cell population. - He has normocytic anemia with hemoglobin between 11 and 12 which is stable.  White count is also stable around 2.7 with an [place] of 1300.  Mild thrombocytopenia is also stable between 80-90. - He did not have any B symptoms.  He did not have any infections in the last 6 months.  No hospitalizations noted. -We will see him back in 6 months for follow-up with repeat blood counts.  2.  B12 deficiency: -We checked his B12 levels which were normal.  We will continue B12 monthly injections at home.   3.  CKD: - His creatinine was mildly elevated in the past around 1.2-1.3.  However it has normalized the last 2 times.      Orders placed this encounter:  Orders Placed This Encounter  Procedures  . Lactate dehydrogenase  . CBC with Differential/Platelet  . Comprehensive metabolic panel  . Ferritin  . Iron and TIBC  . Vitamin B12  . Folate      Derek Jack, MD [place] [phone]

## 2018-06-09 NOTE — Patient Instructions (Signed)
[facility] at [facility] Discharge Instructions  Follow up in 6 months with labs    Thank you for choosing [facility] at [facility] to provide your oncology and hematology care.  To afford each patient quality time with our provider, please arrive at least 15 minutes before your scheduled appointment time.   If you have a lab appointment with the Cancer Center please come in thru the  Main Entrance and check in at the main information desk  You need to re-schedule your appointment should you arrive 10 or more minutes late.  We strive to give you quality time with our providers, and arriving late affects you and other patients whose appointments are after yours.  Also, if you no show three or more times for appointments you may be dismissed from the clinic at the providers discretion.     Again, thank you for choosing [facility].  Our hope is that these requests will decrease the amount of time that you wait before being seen by our physicians.       _____________________________________________________________  Should you have questions after your visit to [facility], please contact our office at [phone] between the hours of 8:00 a.m. and 4:30 p.m.  Voicemails left after 4:00 p.m. will not be returned until the following business day.  For prescription refill requests, have your pharmacy contact our office and allow 72 hours.    Cancer Center Support Programs:   > Cancer Support Group  2nd Tuesday of the month 1pm-2pm, Journey Room    

## 2018-06-11 DIAGNOSIS — N183 Chronic kidney disease, stage 3 (moderate): Secondary | ICD-10-CM | POA: Diagnosis not present

## 2018-06-11 DIAGNOSIS — I2510 Atherosclerotic heart disease of native coronary artery without angina pectoris: Secondary | ICD-10-CM | POA: Diagnosis not present

## 2018-06-11 DIAGNOSIS — I129 Hypertensive chronic kidney disease with stage 1 through stage 4 chronic kidney disease, or unspecified chronic kidney disease: Secondary | ICD-10-CM | POA: Diagnosis not present

## 2018-06-11 DIAGNOSIS — R896 Abnormal cytological findings in specimens from other organs, systems and tissues: Secondary | ICD-10-CM | POA: Diagnosis not present

## 2018-06-19 NOTE — Progress Notes (Signed)
Cardiology Office Note  Date: 06/23/2018   ID: [name], DOB [birth year], MRN [id number]  PCP: Sinda Du, MD  Primary Cardiologist: Rozann Lesches, MD   Chief Complaint  Patient presents with  . Coronary Artery Disease    History of Present Illness: [name] is an 85 y.o. male last seen in August 2019.  He is here today for a routine visit, states that he has been feeling well overall.  He does not report any angina symptoms at this time on medical therapy.  Remains functional with ADLs.  He has a history of Medtronic ICD placement per Dr. Lovena Le, generator was not replaced at [facility] following discussions and improvement in LVEF.  I personally reviewed his ECG today which shows normal sinus rhythm with right bundle branch block and left anterior fascicular block.  Current cardiac regimen includes aspirin, Coreg, HCTZ, Cozaar, Aldactone, and Zocor.  He had a recent physical with Dr. Luan Pulling.  Past Medical History:  Diagnosis Date  . BPH (benign prostatic hypertrophy)   . Chronic systolic heart failure ([place])   . Coronary artery disease    Multivessel status post CABG 8/06, LIMA-LAD, SVG-CFX, SVG-distal RCA  . Essential hypertension   . Hyperlipidemia   . ICD (implantable cardiac defibrillator) in place    Medtronic  . Ischemic cardiomyopathy   . Myelodysplasia, low grade ([place]) 11/27/2015  . Nephrolithiasis   . Prostate cancer ([place])   . Vitamin B 12 deficiency 07/27/2016    Past Surgical History:  Procedure Laterality Date  . CARDIAC DEFIBRILLATOR PLACEMENT    . COLONOSCOPY    . COLONOSCOPY N/A 08/19/2013   Procedure: COLONOSCOPY;  Surgeon: Rogene Houston, MD;  Location[facility];  Service: Endoscopy;  Laterality: N/A;  930  . CORONARY ARTERY BYPASS GRAFT     8/06 with left anterior descending artery, saphenous vein graft to circumflex, saphenous vein graft to distal right coronary artery  . HERNIA REPAIR    . POLYPECTOMY    . PROSTATE SURGERY       Current Outpatient Medications  Medication Sig Dispense Refill  . acetaminophen (TYLENOL) 500 MG tablet Take 1 tablet (500 mg total) by mouth every 6 (six) hours as needed for mild pain or fever. 30 tablet 0  . allopurinol (ZYLOPRIM) 300 MG tablet Take 300 mg by mouth daily.     Marland Kitchen aspirin 81 MG chewable tablet Chew 81 mg by mouth daily.    . carvedilol (COREG) 12.5 MG tablet Take 12.5 mg by mouth 2 (two) times daily with a meal.    . cetirizine (ZYRTEC) 10 MG tablet Take 10 mg by mouth daily as needed for allergies.     . Cholecalciferol (VITAMIN D3) 1000 UNITS CAPS Take 1 capsule by mouth daily.     . cyanocobalamin (,VITAMIN B-12,) 1000 MCG/ML injection INJECT 1ML INTO THE MUSCLE ONCE AS DIRECTED 1 mL 14  . diphenhydrAMINE (BENADRYL) 25 MG tablet Take 25 mg by mouth at bedtime as needed.    . hydrochlorothiazide (HYDRODIURIL) 25 MG tablet Take 0.5 tablets (12.5 mg total) by mouth daily. 45 tablet 2  . losartan (COZAAR) 50 MG tablet Take 1.5 tablets (75 mg total) by mouth daily. 45 tablet 6  . simvastatin (ZOCOR) 40 MG tablet TAKE ONE TABLET BY MOUTH EVERY EVENING 30 tablet 0  . spironolactone (ALDACTONE) 25 MG tablet Take 0.5 tablets (12.5 mg total) by mouth daily. 45 tablet 1  . vitamin C (ASCORBIC ACID) 500 MG tablet  Take 500 mg by mouth daily.       No current facility-administered medications for this visit.    Allergies:  Codeine   Social History: The patient  reports that he quit smoking about 26 years ago. His smoking use included cigarettes. He has a 11.00 pack-year smoking history. He has never used smokeless tobacco. He reports that he does not drink alcohol or use drugs.   ROS:  Please see the history of present illness. Otherwise, complete review of systems is positive for hearing loss.  All other systems are reviewed and negative.   Physical Exam: VS:  BP 140/80 (BP Location: Left Arm, Patient Position: Sitting)   Ht '5\' 4"'  (1.626 m)   Wt 182 lb (82.6 kg)   BMI 31.24  kg/m , BMI Body mass index is 31.24 kg/m.  Wt Readings from Last 3 Encounters:  06/23/18 182 lb (82.6 kg)  06/09/18 180 lb 8 oz (81.9 kg)  12/25/17 177 lb 6.4 oz (80.5 kg)    General: Elderly male, appears comfortable at rest. HEENT: Conjunctiva and lids normal, oropharynx clear. Neck: Supple, no elevated JVP or carotid bruits, no thyromegaly. Lungs: Clear to auscultation, nonlabored breathing at rest. Cardiac: Regular rate and rhythm, soft systolic murmur. Abdomen: Soft, nontender, bowel sounds present. Extremities: No pitting edema, distal pulses 2+. Skin: Warm and dry. Musculoskeletal: No kyphosis. Neuropsychiatric: Alert and oriented x3, affect grossly appropriate.  ECG: I personally reviewed the tracing from 06/19/2017 which showed sinus rhythm with right bundle branch block and left anterior fascicular block.  Recent Labwork: 06/02/2018: ALT 10; AST 13; BUN 29; Creatinine, Ser 1.17; Hemoglobin 11.4; Platelets 91; Potassium 4.4; Sodium 138     Component Value Date/Time   CHOL 122 09/09/2014 1123   TRIG 125.0 09/09/2014 1123   HDL 32.80 (L) 09/09/2014 1123   CHOLHDL 4 09/09/2014 1123   VLDL 25.0 09/09/2014 1123   LDLCALC 64 09/09/2014 1123    Other Studies Reviewed Today:  Carotid Dopplers 10/24/2016: Stable 1-39% bilateral ICA stenoses.  Assessment and Plan:  1.  Multivessel CAD status post CABG in 2006.  He continues to do well without angina symptoms on medical therapy.  ECG reviewed.  No changes were made today.  2.  History of ischemic cardiomyopathy with improvement in LVEF to the range of 45 to 50%.  He is symptomatically stable.  Medtronic ICD met ERI and generator was not changed per discussion with Dr. Lovena Le.  No palpitations or syncope.  3.  Mixed hyperlipidemia, continues on Zocor.  Following lipids with Dr. Luan Pulling.  Current medicines were reviewed with the patient today.   Orders Placed This Encounter  Procedures  . EKG 12-Lead    Disposition:  Follow-up in 6 months.  Signed, Satira Sark, MD, [facility] 06/23/2018 4:24 PM    [place] [facility] [address] [place], [place] [address] Phone: [phone]; Fax: [phone]

## 2018-06-23 ENCOUNTER — Ambulatory Visit (INDEPENDENT_AMBULATORY_CARE_PROVIDER_SITE_OTHER): Payer: Medicare Other | Admitting: Cardiology

## 2018-06-23 ENCOUNTER — Encounter: Payer: Self-pay | Admitting: Cardiology

## 2018-06-23 VITALS — BP 140/80 | Ht 64.0 in | Wt 182.0 lb

## 2018-06-23 DIAGNOSIS — I25119 Atherosclerotic heart disease of native coronary artery with unspecified angina pectoris: Secondary | ICD-10-CM | POA: Diagnosis not present

## 2018-06-23 DIAGNOSIS — E782 Mixed hyperlipidemia: Secondary | ICD-10-CM | POA: Diagnosis not present

## 2018-06-23 DIAGNOSIS — I255 Ischemic cardiomyopathy: Secondary | ICD-10-CM | POA: Diagnosis not present

## 2018-06-23 NOTE — Patient Instructions (Signed)
Medication Instructions:  Your physician recommends that you continue on your current medications as directed. Please refer to the Current Medication list given to you today.  If you need a refill on your cardiac medications before your next appointment, please call your pharmacy.   Lab work: None today If you have labs (blood work) drawn today and your tests are completely normal, you will receive your results only by: . MyChart Message (if you have MyChart) OR . A paper copy in the mail If you have any lab test that is abnormal or we need to change your treatment, we will call you to review the results.  Testing/Procedures: None today  Follow-Up: At [facility], you and your health needs are our priority.  As part of our continuing mission to provide you with exceptional heart care, we have created designated Provider Care Teams.  These Care Teams include your primary Cardiologist (physician) and Advanced Practice Providers (APPs -  Physician Assistants and Nurse Practitioners) who all work together to provide you with the care you need, when you need it. You will need a follow up appointment in 6 months.  Please call our office 2 months in advance to schedule this appointment.  You may see Samuel McDowell, MD or one of the following Advanced Practice Providers on your designated Care Team:   Brittany Strader, PA-C ([place] Office) . Michele Lenze, PA-C ([place] Office)   Any Other Special Instructions Will Be Listed Below (If Applicable). none   

## 2018-07-30 ENCOUNTER — Other Ambulatory Visit: Payer: Self-pay | Admitting: Cardiology

## 2018-08-12 ENCOUNTER — Other Ambulatory Visit: Payer: Self-pay | Admitting: Cardiology

## 2018-09-23 ENCOUNTER — Other Ambulatory Visit: Payer: Self-pay

## 2018-09-23 ENCOUNTER — Ambulatory Visit (INDEPENDENT_AMBULATORY_CARE_PROVIDER_SITE_OTHER): Payer: Medicare Other | Admitting: Internal Medicine

## 2018-09-23 ENCOUNTER — Encounter (INDEPENDENT_AMBULATORY_CARE_PROVIDER_SITE_OTHER): Payer: Self-pay | Admitting: Internal Medicine

## 2018-09-23 VITALS — BP 157/70 | HR 62 | Temp 97.5°F | Resp 18 | Ht 64.0 in | Wt 178.3 lb

## 2018-09-23 DIAGNOSIS — I255 Ischemic cardiomyopathy: Secondary | ICD-10-CM | POA: Diagnosis not present

## 2018-09-23 DIAGNOSIS — Z86010 Personal history of colonic polyps: Secondary | ICD-10-CM | POA: Diagnosis not present

## 2018-09-23 NOTE — Patient Instructions (Signed)
Talk with you over the phone in September 2020. Notify if you have rectal bleeding or change in your bowel habits.

## 2018-09-23 NOTE — Progress Notes (Signed)
Presenting complaint;  History of colonic adenomas.  Database and subjective:  Patient is 85 year old Caucasian male who has a history of multiple colonic adenomas and is here for scheduled visit. He had multiple colonic adenomas removed on his colonoscopy of February 2010 and he had 4 polyps removed on his last colonoscopy of April 2015.  2 of these polyps are tubular adenomas. Because of his age it was decided to see him in the office rather than scheduling another colonoscopy. Family history significant for colon carcinoma in his father who was 18 at 59 years old at the time of diagnosis and died in less than 2 years of metastatic disease.  Patient states he is doing well.  He has no complaints.  His bowels move regularly he denies abdominal pain melena or rectal bleeding.  He has good appetite and his weight has been stable. He feels he has no limitations.  He just started vegetable garden this week.  He does usual household work and he also drives.  He says his wife is 69 years old and she is in good health. He states he is not having any ongoing cardiac issues.  He had defibrillator about 9 years ago but it never fired.  Now the battery has died and Dr. Lovena Le felt he did not have have to have it changed. Nobody else in his family has had colonic polyps or carcinoma other than his father as above. He has myelodysplastic syndrome and is under care of Dr. Delton Coombes.  Current Medications: Outpatient Encounter Medications as of 09/23/2018  Medication Sig  . acetaminophen (TYLENOL) 500 MG tablet Take 1 tablet (500 mg total) by mouth every 6 (six) hours as needed for mild pain or fever.  Marland Kitchen allopurinol (ZYLOPRIM) 300 MG tablet Take 300 mg by mouth daily.   Marland Kitchen aspirin 81 MG chewable tablet Chew 81 mg by mouth daily.  . carvedilol (COREG) 12.5 MG tablet Take 12.5 mg by mouth 2 (two) times daily with a meal.  . cetirizine (ZYRTEC) 10 MG tablet Take 10 mg by mouth daily as needed for allergies.    . Cholecalciferol (VITAMIN D3) 1000 UNITS CAPS Take 1 capsule by mouth daily.   . cyanocobalamin (,VITAMIN B-12,) 1000 MCG/ML injection INJECT 1ML INTO THE MUSCLE ONCE AS DIRECTED  . diphenhydrAMINE (BENADRYL) 25 MG tablet Take 25 mg by mouth at bedtime as needed.  . hydrochlorothiazide (HYDRODIURIL) 25 MG tablet TAKE 1/2 TABLET(12.5 MG) BY MOUTH DAILY  . losartan (COZAAR) 50 MG tablet TAKE 1 AND 1/2 TABLET BY MOUTH DAILY  . simvastatin (ZOCOR) 40 MG tablet TAKE ONE TABLET BY MOUTH EVERY EVENING  . spironolactone (ALDACTONE) 25 MG tablet TAKE 1/2 TABLET(12.5 MG) BY MOUTH DAILY  . vitamin C (ASCORBIC ACID) 500 MG tablet Take 500 mg by mouth daily.     No facility-administered encounter medications on file as of 09/23/2018.      Objective: Blood pressure (!) 157/70, pulse 62, temperature (!) 97.5 F (36.4 C), temperature source Oral, resp. rate 18, height '5\' 4"'  (1.626 m), weight 178 lb 4.8 oz (80.9 kg). Patient is alert and in no acute distress.   He appears younger than stated age.   Conjunctiva is pink. Sclera is nonicteric Oropharyngeal mucosa is normal. No neck masses or thyromegaly noted. He has AICD in left pectoral region. Cardiac exam with regular rhythm normal S1 and S2. No murmur or gallop noted. Lungs are clear to auscultation. Abdomen is full but soft and nontender with organomegaly or masses. No  LE edema or clubbing noted.  Labs/studies Results:  CBC Latest Ref Rng & Units 06/02/2018 11/20/2017 05/24/2017  WBC 4.0 - 10.5 K/uL 2.7(L) 2.9(L) 3.1(L)  Hemoglobin 13.0 - 17.0 g/dL 11.4(L) 11.1(L) 11.9(L)  Hematocrit 39.0 - 52.0 % 35.8(L) 34.3(L) 36.6(L)  Platelets 150 - 400 K/uL 91(L) 85(L) 90(L)    CMP Latest Ref Rng & Units 06/02/2018 11/20/2017 05/24/2017  Glucose 70 - 99 mg/dL 112(H) 185(H) 152(H)  BUN 8 - 23 mg/dL 29(H) 30(H) 31(H)  Creatinine 0.61 - 1.24 mg/dL 1.17 1.20 1.26(H)  Sodium 135 - 145 mmol/L 138 136 137  Potassium 3.5 - 5.1 mmol/L 4.4 4.8 4.8  Chloride 98 -  111 mmol/L 104 106 103  CO2 22 - 32 mmol/L '27 23 24  ' Calcium 8.9 - 10.3 mg/dL 9.2 8.8(L) 9.5  Total Protein 6.5 - 8.1 g/dL 7.0 6.7 7.2  Total Bilirubin 0.3 - 1.2 mg/dL 0.7 0.8 0.6  Alkaline Phos 38 - 126 U/L 42 47 55  AST 15 - 41 U/L 13(L) 18 17  ALT 0 - 44 U/L 10 11 11(L)    Hepatic Function Latest Ref Rng & Units 06/02/2018 11/20/2017 05/24/2017  Total Protein 6.5 - 8.1 g/dL 7.0 6.7 7.2  Albumin 3.5 - 5.0 g/dL 4.1 3.9 4.1  AST 15 - 41 U/L 13(L) 18 17  ALT 0 - 44 U/L 10 11 11(L)  Alk Phosphatase 38 - 126 U/L 42 47 55  Total Bilirubin 0.3 - 1.2 mg/dL 0.7 0.8 0.6  Bilirubin, Direct 0.0 - 0.3 mg/dL - - -      Assessment:  #1.  History of multiple colonic adenomas and family history of CRC in father who died at age 83.  Patient's last colonoscopy was in April 2015 with removal of 4 polyps and 2 are tubular adenomas.  Patient has no GI symptoms.  Despite his medical problems he is doing well with excellent quality of life.  Since his risk of colon carcinoma is significant it would not be unreasonable to consider surveillance colonoscopy at some point as long as he is doing well.  He actually wants to proceed with colonoscopy when COVID-19 pandemic over.  #2.  History of myelodysplastic syndrome.  He has pancytopenia.  He is under care of Dr. Delton Coombes.   Plan:  Possible surveillance colonoscopy later this year if he continues to do well. Will do CBC prior to colonoscopy to make sure his platelet count is above 50,000. Will talk with patient over the phone in September this year before colonoscopy scheduled. Office visit on as-needed basis.

## 2018-10-10 DIAGNOSIS — I2510 Atherosclerotic heart disease of native coronary artery without angina pectoris: Secondary | ICD-10-CM | POA: Diagnosis not present

## 2018-10-10 DIAGNOSIS — J301 Allergic rhinitis due to pollen: Secondary | ICD-10-CM | POA: Diagnosis not present

## 2018-10-10 DIAGNOSIS — E785 Hyperlipidemia, unspecified: Secondary | ICD-10-CM | POA: Diagnosis not present

## 2018-10-10 DIAGNOSIS — I10 Essential (primary) hypertension: Secondary | ICD-10-CM | POA: Diagnosis not present

## 2018-10-31 ENCOUNTER — Emergency Department (HOSPITAL_COMMUNITY)
Admission: EM | Admit: 2018-10-31 | Discharge: 2018-10-31 | Disposition: A | Discharge status: Home / Self Care | Payer: Medicare Other | Attending: Emergency Medicine | Admitting: Emergency Medicine

## 2018-10-31 ENCOUNTER — Encounter (HOSPITAL_COMMUNITY): Payer: Self-pay | Admitting: *Deleted

## 2018-10-31 ENCOUNTER — Other Ambulatory Visit: Payer: Self-pay

## 2018-10-31 ENCOUNTER — Emergency Department (HOSPITAL_COMMUNITY): Payer: Medicare Other

## 2018-10-31 DIAGNOSIS — R0789 Other chest pain: Secondary | ICD-10-CM | POA: Diagnosis not present

## 2018-10-31 DIAGNOSIS — Z95810 Presence of automatic (implantable) cardiac defibrillator: Secondary | ICD-10-CM | POA: Insufficient documentation

## 2018-10-31 DIAGNOSIS — I25810 Atherosclerosis of coronary artery bypass graft(s) without angina pectoris: Secondary | ICD-10-CM | POA: Diagnosis not present

## 2018-10-31 DIAGNOSIS — R220 Localized swelling, mass and lump, head: Secondary | ICD-10-CM | POA: Insufficient documentation

## 2018-10-31 DIAGNOSIS — I252 Old myocardial infarction: Secondary | ICD-10-CM | POA: Insufficient documentation

## 2018-10-31 DIAGNOSIS — Z23 Encounter for immunization: Secondary | ICD-10-CM | POA: Diagnosis not present

## 2018-10-31 DIAGNOSIS — R0781 Pleurodynia: Secondary | ICD-10-CM

## 2018-10-31 DIAGNOSIS — Z7982 Long term (current) use of aspirin: Secondary | ICD-10-CM | POA: Diagnosis not present

## 2018-10-31 DIAGNOSIS — I5022 Chronic systolic (congestive) heart failure: Secondary | ICD-10-CM | POA: Diagnosis not present

## 2018-10-31 DIAGNOSIS — S299XXA Unspecified injury of thorax, initial encounter: Secondary | ICD-10-CM | POA: Diagnosis not present

## 2018-10-31 DIAGNOSIS — S0990XA Unspecified injury of head, initial encounter: Secondary | ICD-10-CM | POA: Diagnosis not present

## 2018-10-31 DIAGNOSIS — I110 Hypertensive heart disease with heart failure: Secondary | ICD-10-CM | POA: Diagnosis not present

## 2018-10-31 DIAGNOSIS — W19XXXA Unspecified fall, initial encounter: Secondary | ICD-10-CM

## 2018-10-31 DIAGNOSIS — Z79899 Other long term (current) drug therapy: Secondary | ICD-10-CM | POA: Insufficient documentation

## 2018-10-31 DIAGNOSIS — Z87891 Personal history of nicotine dependence: Secondary | ICD-10-CM | POA: Diagnosis not present

## 2018-10-31 MED ORDER — HYDROCODONE-ACETAMINOPHEN 5-325 MG PO TABS
1.0000 | ORAL_TABLET | Freq: Once | ORAL | Status: AC
  Administered 2018-10-31: 12:00:00 1 via ORAL
  Filled 2018-10-31: qty 1

## 2018-10-31 MED ORDER — HYDROCODONE-ACETAMINOPHEN 5-325 MG PO TABS: ORAL_TABLET | ORAL | 0 refills | Status: DC

## 2018-10-31 MED ORDER — TETANUS-DIPHTH-ACELL PERTUSSIS 5-2.5-18.5 LF-MCG/0.5 IM SUSP
0.5000 mL | Freq: Once | INTRAMUSCULAR | Status: AC
  Administered 2018-10-31: 0.5 mL via INTRAMUSCULAR
  Filled 2018-10-31: qty 0.5

## 2018-10-31 NOTE — ED Triage Notes (Signed)
FELL AT HOME LAST NIGHT, STATES HIS LEFT KNEE GAVE OUT AND HE FELL BACKWARD AND INJURED HIS  LEFT SIDE

## 2018-10-31 NOTE — ED Provider Notes (Signed)
[facility] EMERGENCY DEPARTMENT Provider Note   CSN: [id number] Arrival date & time: 10/31/18  1027     History   Chief Complaint Chief Complaint  Patient presents with   Fall    HPI [name] is a 85 y.o. male.     HPI   [name] is a 85 y.o. male with a hx of HTN, CAD, cardiomyopathy and he has an ICD,  He presents to the Emergency Department complaining of right sided rib pain and several skin tears of the hands and left elbow.  He states that his knee "gave away" causing him to fall just outside the door of his screened porch.  He describes a fall onto his back and right side.  Incident occurred last evening.  He was able to get up with some assistance.  He has pain to his right upper back and ribs associated with movement, deep breathing, cough or laughing.  He denies shortness of breath and known head injury.  He does take ASA daily.  He denies LOC, visual changes, nausea or vomiting, neck pain, numbness or weakness of the extremities.      Past Medical History:  Diagnosis Date   BPH (benign prostatic hypertrophy)    Chronic systolic heart failure (HCC)    Coronary artery disease    Multivessel status post CABG 8/06, LIMA-LAD, SVG-CFX, SVG-distal RCA   Essential hypertension    Hyperlipidemia    ICD (implantable cardiac defibrillator) in place    [employer]   Ischemic cardiomyopathy    Myelodysplasia, low grade ([place]) 11/27/2015   Nephrolithiasis    Prostate cancer ([place])    Vitamin B 12 deficiency 07/27/2016    Patient Active Problem List   Diagnosis Date Noted   Vitamin B 12 deficiency 07/27/2016   Myelodysplasia, low grade ([place]) 11/27/2015   Other primary cardiomyopathies 08/07/2011   Carotid stenosis 07/26/2011   Automatic implantable cardioverter-defibrillator in situ 07/26/2009   CAD, AUTOLOGOUS BYPASS GRAFT 08/24/2008   PROSTATE CANCER 08/03/2008   HYPERLIPIDEMIA-MIXED 08/03/2008   Essential hypertension 08/03/2008    MYOCARDIAL INFARCTION, HX OF 08/03/2008   Coronary atherosclerosis 08/03/2008   CARDIOMYOPATHY, ISCHEMIC 23/30/0762   SYSTOLIC HEART FAILURE, CHRONIC 08/03/2008   NEPHROLITHIASIS, HX OF 08/03/2008   POSTSURGICAL AORTOCORONARY BYPASS STATUS 08/03/2008    Past Surgical History:  Procedure Laterality Date   CARDIAC DEFIBRILLATOR PLACEMENT     COLONOSCOPY     COLONOSCOPY N/A 08/19/2013   Procedure: COLONOSCOPY;  Surgeon: Rogene Houston, MD;  Location: [facility] ENDO SUITE;  Service: Endoscopy;  Laterality: N/A;  47   CORONARY ARTERY BYPASS GRAFT     8/06 with left anterior descending artery, saphenous vein graft to circumflex, saphenous vein graft to distal right coronary artery   HERNIA REPAIR     POLYPECTOMY     PROSTATE SURGERY          Home Medications    Prior to Admission medications   Medication Sig Start Date End Date Taking? Authorizing Provider  acetaminophen (TYLENOL) 500 MG tablet Take 1 tablet (500 mg total) by mouth every 6 (six) hours as needed for mild pain or fever. 07/14/15   Carmin Muskrat, MD  allopurinol (ZYLOPRIM) 300 MG tablet Take 300 mg by mouth daily.  10/20/13   [provider]  aspirin 81 MG chewable tablet Chew 81 mg by mouth daily.    [provider]  carvedilol (COREG) 12.5 MG tablet Take 12.5 mg by mouth 2 (two) times daily with a  meal.    [provider]  cetirizine (ZYRTEC) 10 MG tablet Take 10 mg by mouth daily as needed for allergies.     [provider]  Cholecalciferol (VITAMIN D3) 1000 UNITS CAPS Take 1 capsule by mouth daily.     [provider]  cyanocobalamin (,VITAMIN B-12,) 1000 MCG/ML injection INJECT 1ML INTO THE MUSCLE ONCE AS DIRECTED 12/02/17   Derek Jack, MD  diphenhydrAMINE (BENADRYL) 25 MG tablet Take 25 mg by mouth at bedtime as needed.    [provider]  hydrochlorothiazide (HYDRODIURIL) 25 MG tablet TAKE 1/2 TABLET(12.5 MG) BY MOUTH DAILY 08/12/18   Satira Sark, MD  losartan (COZAAR) 50 MG tablet TAKE 1 AND 1/2 TABLET BY MOUTH DAILY 07/30/18   Satira Sark, MD  simvastatin (ZOCOR) 40 MG tablet TAKE ONE TABLET BY MOUTH EVERY EVENING 04/16/13   Larey Dresser, MD  spironolactone (ALDACTONE) 25 MG tablet TAKE 1/2 TABLET(12.5 MG) BY MOUTH DAILY 08/12/18   Satira Sark, MD  vitamin C (ASCORBIC ACID) 500 MG tablet Take 500 mg by mouth daily.      [provider]    Family History Family History  Problem Relation Age of Onset   Colon cancer Father    Coronary artery disease Other     Social History Social History   Tobacco Use   Smoking status: Former Smoker    Packs/day: 0.25    Years: 44.00    Pack years: 11.00    Types: Cigarettes    Quit date: 05/14/1992    Years since quitting: 26.4   Smokeless tobacco: Never Used  Substance Use Topics   Alcohol use: No   Drug use: No     Allergies   Codeine   Review of Systems Review of Systems  Constitutional: Negative for chills and fever.  Eyes: Negative for visual disturbance.  Respiratory: Negative for cough, chest tightness and shortness of breath.   Cardiovascular: Positive for chest pain (right lateral chest pain). Negative for palpitations.  Gastrointestinal: Negative for abdominal pain, nausea and vomiting.  Genitourinary: Negative for difficulty urinating, dysuria, frequency and hematuria.  Musculoskeletal: Positive for arthralgias. Negative for joint swelling and neck pain.  Skin: Positive for wound (skin tears to both hands and left elbow). Negative for color change.  Neurological: Negative for dizziness, syncope, weakness, numbness and headaches.  Psychiatric/Behavioral: Negative for confusion.     Physical Exam Updated Vital Signs BP (!) 134/55    Pulse 73    Temp 98.2 F (36.8 C) (Oral)    Resp 20    Ht 5\' 4"  (1.626 m)    Wt 80 kg    SpO2 96%    BMI 30.27 kg/m   Physical Exam Vitals signs and nursing note reviewed.  Constitutional:       Appearance: Normal appearance. He is not ill-appearing.  HENT:     Head: Atraumatic.     Nose: Nose normal.     Mouth/Throat:     Mouth: Mucous membranes are moist.  Eyes:     Extraocular Movements: Extraocular movements intact.     Conjunctiva/sclera: Conjunctivae normal.     Pupils: Pupils are equal, round, and reactive to light.  Neck:     Musculoskeletal: Normal range of motion. No neck rigidity or muscular tenderness.  Cardiovascular:     Rate and Rhythm: Normal rate and regular rhythm.     Pulses: Normal pulses.  Pulmonary:     Effort: Pulmonary effort is normal.  Breath sounds: Normal breath sounds.  Chest:     Chest wall: Tenderness (ttp of the lateral upper right chest wall.  no crepitus or bruising ) present.  Abdominal:     Palpations: Abdomen is soft. There is no mass.     Tenderness: There is no abdominal tenderness.  Musculoskeletal: Normal range of motion.     Comments: Pt has full ROM of all the fingers of both hands, bilateral wrists and shoulders w/o pain.  No edema of the extremities.  No spinal tenderness. Pelvis also non-tender  Skin:    General: Skin is warm.     Capillary Refill: Capillary refill takes less than 2 seconds.     Comments: Small skin tear of the lateral left hand and posterior left elbow.  No active bleeding.  Small skin tear of the lateral right hand as well, also w/o active bleeding.    Neurological:     General: No focal deficit present.     Mental Status: He is alert and oriented to person, place, and time.     Sensory: No sensory deficit.     Motor: No weakness.      ED Treatments / Results  Labs (all labs ordered are listed, but only abnormal results are displayed) Labs Reviewed - No data to display  EKG    Radiology Dg Ribs Unilateral W/chest Right  Result Date: 10/31/2018 CLINICAL DATA:  Fall last night, pain to RIGHT ribs. History of prostate cancer. EXAM: RIGHT RIBS AND CHEST - 3+ VIEW COMPARISON:  Chest x-ray dated  07/14/2015. FINDINGS: Heart size and mediastinal contours are stable. LEFT chest wall pacemaker/ICD apparatus appears stable. Lungs are clear. No pleural effusion or pneumothorax seen. Median sternotomy wires in place. Old healed fracture of the RIGHT posterior ninth rib. No acute appearing osseous abnormality. No evidence of RIGHT-sided rib fracture or displacement seen. IMPRESSION: No acute cardiopulmonary findings. No acute rib fracture or displacement seen. Electronically Signed   By: Franki Cabot M.D.   On: 10/31/2018 12:28   Ct Head Wo Contrast  Result Date: 10/31/2018 CLINICAL DATA:  [place] last night, on anticoagulation. EXAM: CT HEAD WITHOUT CONTRAST TECHNIQUE: Contiguous axial images were obtained from the base of the skull through the vertex without intravenous contrast. COMPARISON:  None. FINDINGS: Brain: Generalized age related parenchymal volume loss with commensurate dilatation of the ventricles and sulci. Chronic small vessel ischemic changes within the bilateral periventricular and subcortical white matter regions. No mass, hemorrhage, edema or other evidence of acute parenchymal abnormality. No extra-axial hemorrhage. Vascular: Chronic calcified atherosclerotic changes of the large vessels at the skull base. No unexpected hyperdense vessel. Skull: Normal. Negative for fracture or focal lesion. Sinuses/Orbits: No acute finding. Other: Scalp edema overlying the upper RIGHT occipital bone. No underlying fracture. IMPRESSION: 1. Scalp edema overlying the upper right occipital bone. No underlying fracture. 2. No acute intracranial abnormality. No intracranial mass, hemorrhage or edema. No skull fracture. 3. Chronic small vessel ischemic changes in the white matter. Electronically Signed   By: Franki Cabot M.D.   On: 10/31/2018 12:06    Procedures Procedures (including critical care time)  Medications Ordered in ED Medications  HYDROcodone-acetaminophen (NORCO/VICODIN) 5-325 MG per tablet 1  tablet (1 tablet Oral Given 10/31/18 1216)  Tdap (BOOSTRIX) injection 0.5 mL (0.5 mLs Intramuscular Given 10/31/18 1217)     Initial Impression / Assessment and Plan / ED Course  I have reviewed the triage vital signs and the nursing notes.  Pertinent labs & imaging  results that were available during my care of the patient were reviewed by me and considered in my medical decision making (see chart for details).        Pt with rib lateral chest wall pain secondary to mechanical fall.  NV intact.  XR neg for fx, but clinically I suspect an occult rib fx.  No pneumo.  Vitals reviewed.  CT head reassuring. No reported head injury.  I feel that he is appropriate for d/c home and close out pt f/u with PCP.  Discussed with Dr. Laverta Baltimore prior to d/c  Skin tears were cleaned and bandaged by nursing.  Td updated.   Pt dispensed incentive spirometer.     Final Clinical Impressions(s) / ED Diagnoses   Final diagnoses:  Fall, initial encounter  Rib pain    ED Discharge Orders    None       Kem Parkinson, Hershal Coria 10/31/18 1312    Long, Wonda Old[place], MD 10/31/18 2003

## 2018-10-31 NOTE — Discharge Instructions (Addendum)
Use the spirometer as directed several times a day to help prevent pneumonia.  You can use a small pillow or folded towel held to your side to cough and take deep breaths.  Be sure to follow-up with your doctor for recheck next week.  Return here for any worsening symptoms such as sudden shortness of breath or increasing pain

## 2018-10-31 NOTE — ED Notes (Signed)
Pt fell last night after his knee gave out, pt denies hitting his head but states that he is on blood thinners but unable to state what blood thinner he is on.

## 2018-10-31 NOTE — ED Notes (Signed)
No bruising noted to right rib cage where pt states is where it hurts, tender to touch

## 2018-10-31 NOTE — ED Notes (Signed)
Pt verbalized understanding of no driving and to use caution within 4 hours of taking pain meds due to meds cause drowsiness 

## 2018-11-10 DIAGNOSIS — R0781 Pleurodynia: Secondary | ICD-10-CM | POA: Diagnosis not present

## 2018-11-22 IMAGING — CT CT ABD-PELV W/ CM
2 of 6 series · 16 of 46 positions shown, 18 images · IV contrast (Isovue)
Comparison: 07/14/2015

CLINICAL DATA: Right-sided abdominal pain over the last 2 weeks.
Leukopenia. History of prostate cancer.

EXAM:
CT ABDOMEN AND PELVIS WITH CONTRAST
TECHNIQUE: Multidetector CT imaging of the abdomen and pelvis was performed
using the standard protocol following bolus administration of
intravenous contrast.
CONTRAST:  80mL [id number]-HRR IOPAMIDOL (H3XN2Q-HRR) INJECTION 61%

[Series 2: axial st · axial · 0.81mm/px · z∈[+1162,+1557]mm · 13 of 93 slices shown, 15 images]
[im 7/93  soft-tissue]
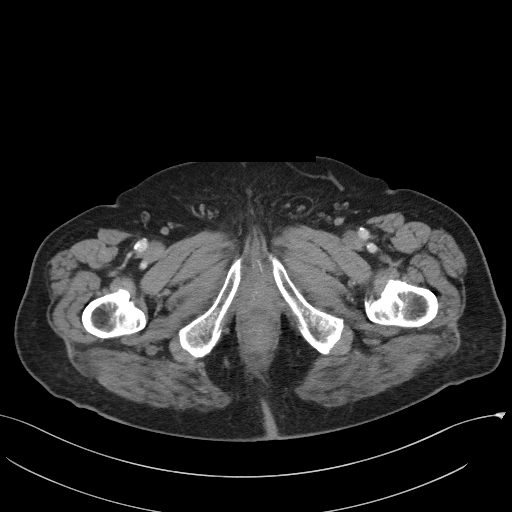
[im 7/93  bone]
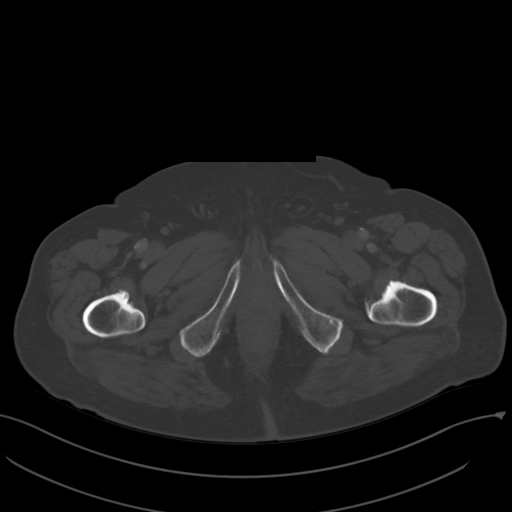
[im 13/93  soft-tissue]
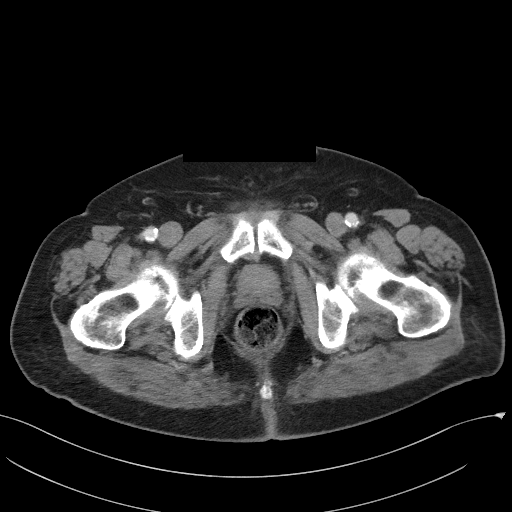
[im 19/93  soft-tissue]
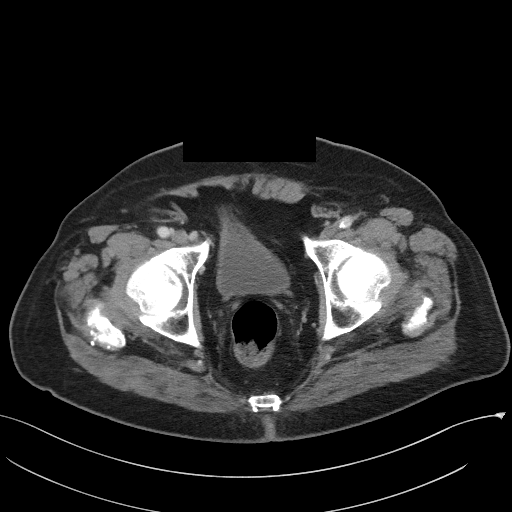
[im 25/93  soft-tissue]
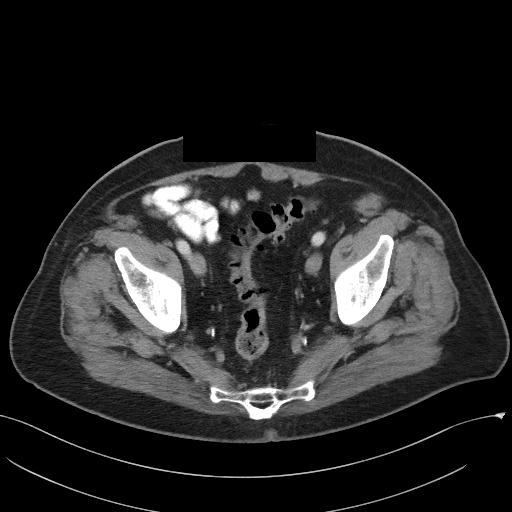
[im 31/93  soft-tissue]
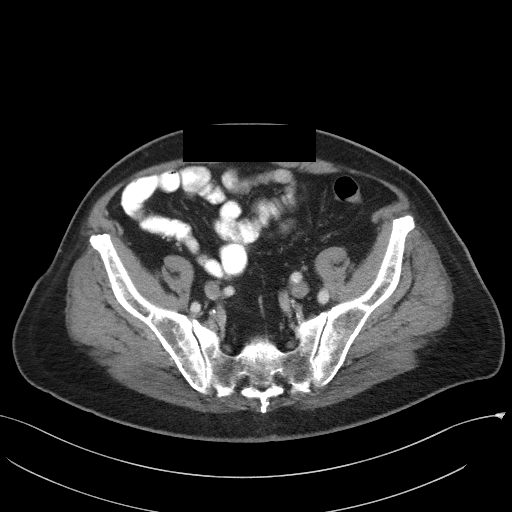
[im 37/93  soft-tissue]
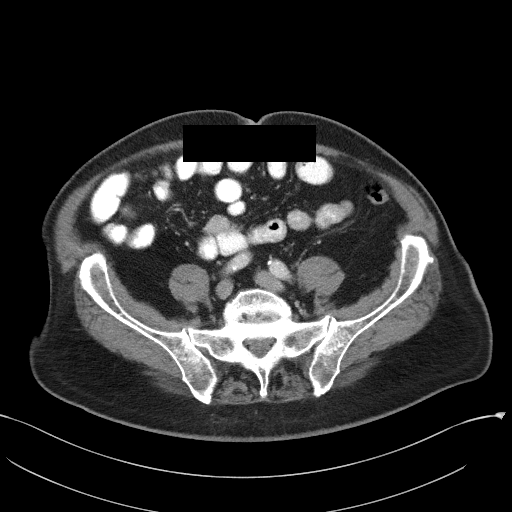
[im 50/93  soft-tissue]
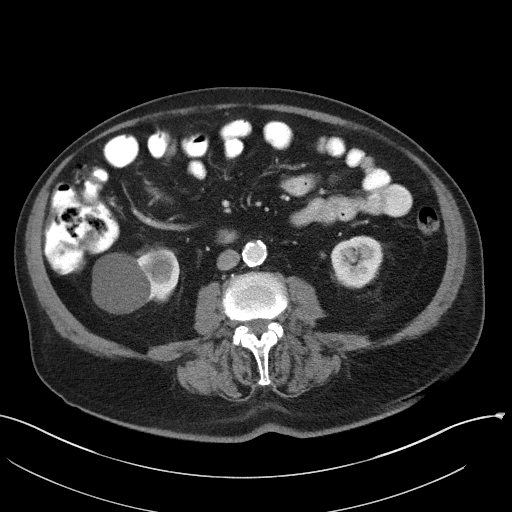
[im 56/93  soft-tissue]
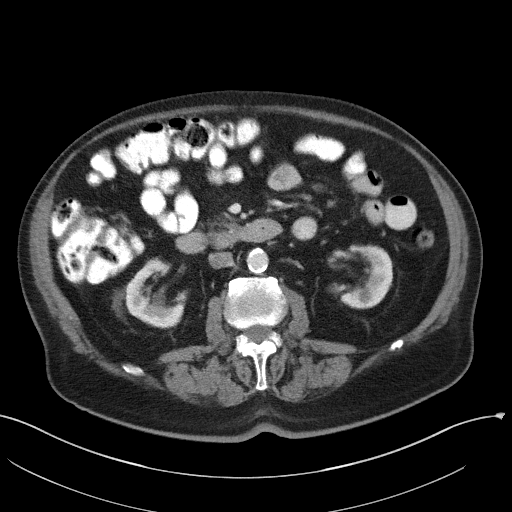
[im 62/93  soft-tissue]
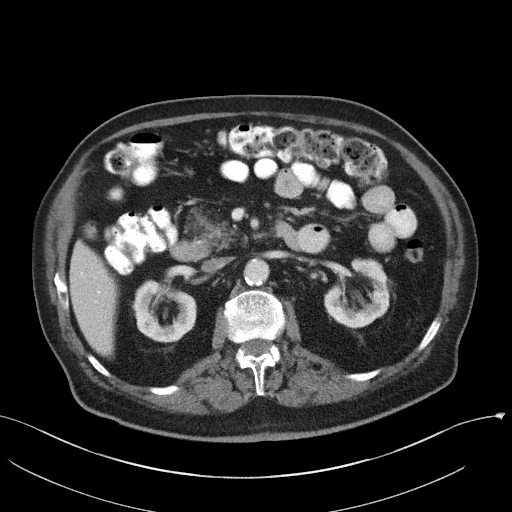
[im 62/93  bone]
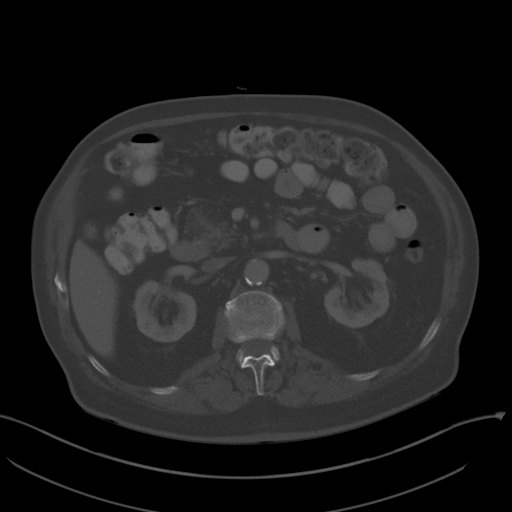
[im 68/93  soft-tissue]
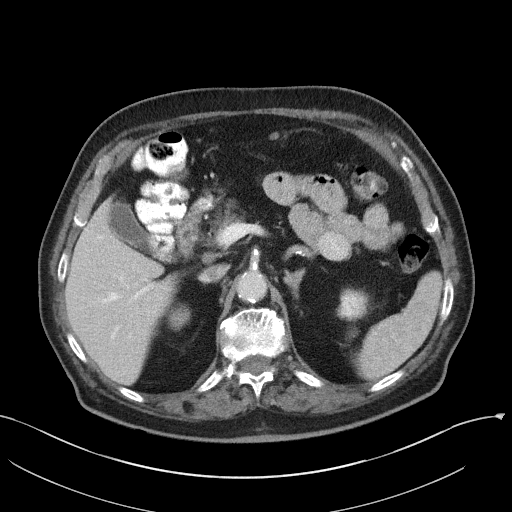
[im 74/93  soft-tissue]
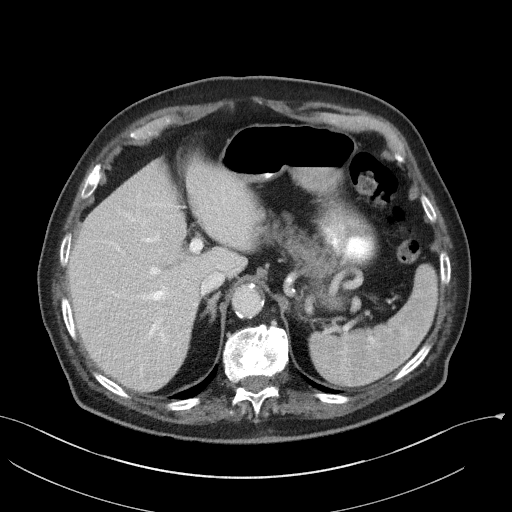
[im 80/93  soft-tissue]
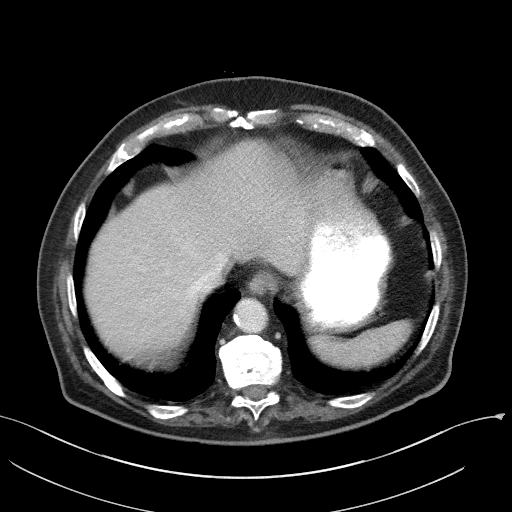
[im 86/93  soft-tissue]
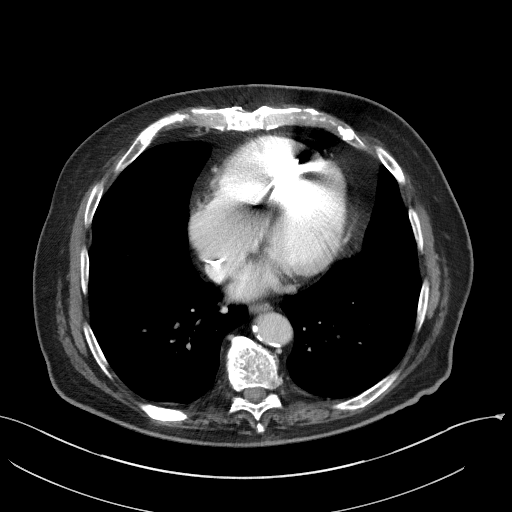

[Series 6: coronal st · coronal · 0.81mm/px · 3 of 113 slices shown]
[im 38/113  soft-tissue]
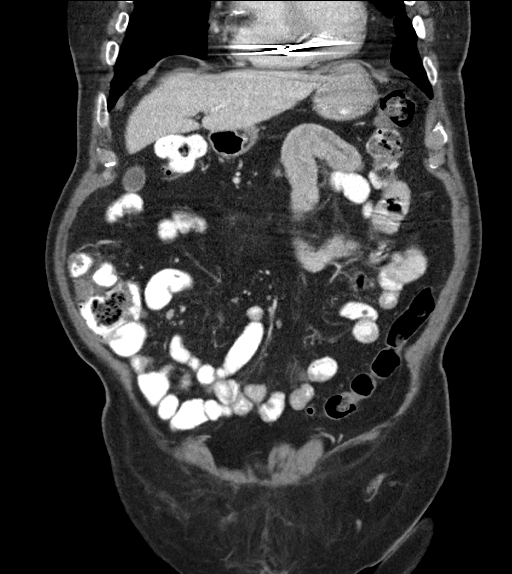
[im 50/113  soft-tissue]
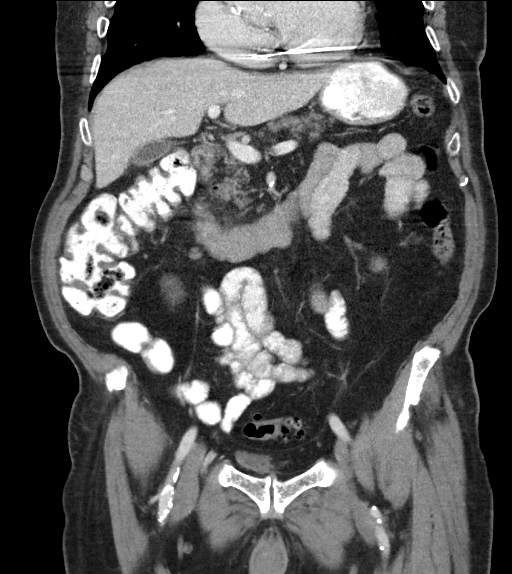
[im 63/113  soft-tissue]
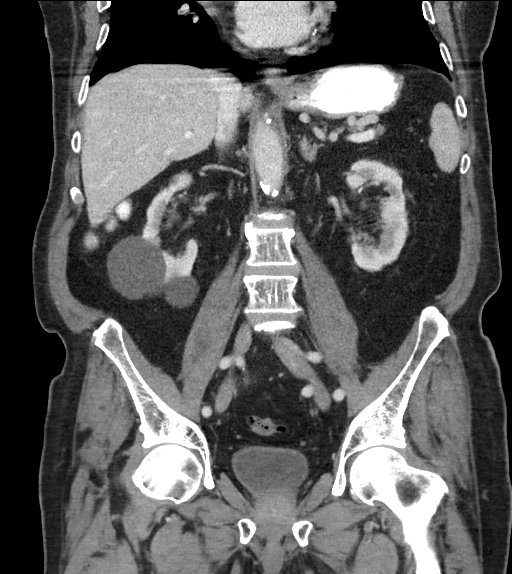

[16 of 46 positions shown; findings below may reference images not displayed]

FINDINGS: Lower chest: Gynecomastia. Lung bases are clear. No pleural or
pericardial fluid.

Hepatobiliary: Normal appearance of the liver parenchyma. Small
group of calcified stones dependent within the gallbladder. No CT
evidence of cholecystitis or obstruction.

Pancreas: Normal.  Small duodenum diverticulum of no significance.

Spleen: Normal

Adrenals/Urinary Tract: Adrenal glands are normal. Moderate
bilateral renal atrophy. Simple appearing cysts of both kidneys,
larger on the right. Nonobstructing 4 mm stone lower pole right
kidney. No stone in either ureter. Bladder appears normal.

Stomach/Bowel: Diverticulosis without CT evidence of diverticulitis.
No other bowel finding.

Vascular/Lymphatic: Aortic atherosclerosis. No aneurysm. IVC is
normal. No retroperitoneal adenopathy.

Reproductive: Normal

Other: No free fluid or air.

Musculoskeletal: Chronic spinal degenerative changes.
IMPRESSION: No acute finding. No cause of right-sided abdominal pain identified.
Patient does have several small gallstones in the gallbladder but
there is no CT evidence of cholecystitis or obstruction.

4 mm nonobstructing stone in the lower pole the right kidney.

Aortic atherosclerosis.

## 2018-12-04 ENCOUNTER — Other Ambulatory Visit: Payer: Self-pay

## 2018-12-04 ENCOUNTER — Inpatient Hospital Stay (HOSPITAL_COMMUNITY): Payer: Medicare Other | Attending: Hematology

## 2018-12-04 DIAGNOSIS — E538 Deficiency of other specified B group vitamins: Secondary | ICD-10-CM | POA: Insufficient documentation

## 2018-12-04 DIAGNOSIS — D4620 Refractory anemia with excess of blasts, unspecified: Secondary | ICD-10-CM | POA: Diagnosis not present

## 2018-12-04 DIAGNOSIS — N189 Chronic kidney disease, unspecified: Secondary | ICD-10-CM | POA: Diagnosis not present

## 2018-12-04 DIAGNOSIS — D469 Myelodysplastic syndrome, unspecified: Secondary | ICD-10-CM

## 2018-12-04 LAB — CBC WITH DIFFERENTIAL/PLATELET
Abs Immature Granulocytes: 0.02 10*3/uL (ref 0.00–0.07)
Basophils Absolute: 0 10*3/uL (ref 0.0–0.1)
Basophils Relative: 0 %
Eosinophils Absolute: 0 10*3/uL (ref 0.0–0.5)
Eosinophils Relative: 0 %
HCT: 36.4 % — ABNORMAL LOW (ref 39.0–52.0)
Hemoglobin: 11.5 g/dL — ABNORMAL LOW (ref 13.0–17.0)
Immature Granulocytes: 1 %
Lymphocytes Relative: 28 %
Lymphs Abs: 0.8 10*3/uL (ref 0.7–4.0)
MCH: 31.1 pg (ref 26.0–34.0)
MCHC: 31.6 g/dL (ref 30.0–36.0)
MCV: 98.4 fL (ref 80.0–100.0)
Monocytes Absolute: 1 10*3/uL (ref 0.1–1.0)
Monocytes Relative: 33 %
Neutro Abs: 1.1 10*3/uL — ABNORMAL LOW (ref 1.7–7.7)
Neutrophils Relative %: 38 %
Platelets: 84 10*3/uL — ABNORMAL LOW (ref 150–400)
RBC: 3.7 MIL/uL — ABNORMAL LOW (ref 4.22–5.81)
RDW: 12.9 % (ref 11.5–15.5)
WBC: 2.9 10*3/uL — ABNORMAL LOW (ref 4.0–10.5)
nRBC: 0 % (ref 0.0–0.2)

## 2018-12-04 LAB — FOLATE: Folate: 18 ng/mL (ref >5.9)

## 2018-12-04 LAB — COMPREHENSIVE METABOLIC PANEL
ALT: 9 U/L (ref 0–44)
AST: 13 U/L — ABNORMAL LOW (ref 15–41)
Albumin: 4.1 g/dL (ref 3.5–5.0)
Alkaline Phosphatase: 94 U/L (ref 38–126)
Anion gap: 8 (ref 5–15)
BUN: 33 mg/dL — ABNORMAL HIGH (ref 8–23)
CO2: 25 mmol/L (ref 22–32)
Calcium: 9.4 mg/dL (ref 8.9–10.3)
Chloride: 105 mmol/L (ref 98–111)
Creatinine, Ser: 1.24 mg/dL (ref 0.61–1.24)
GFR calc Af Amer: 59 mL/min — ABNORMAL LOW (ref >60)
GFR calc non Af Amer: 51 mL/min — ABNORMAL LOW (ref >60)
Glucose, Bld: 154 mg/dL — ABNORMAL HIGH (ref 70–99)
Potassium: 5 mmol/L (ref 3.5–5.1)
Sodium: 138 mmol/L (ref 135–145)
Total Bilirubin: 0.8 mg/dL (ref 0.3–1.2)
Total Protein: 6.8 g/dL (ref 6.5–8.1)

## 2018-12-04 LAB — LACTATE DEHYDROGENASE: LDH: 191 U/L (ref 98–192)

## 2018-12-04 LAB — VITAMIN B12: Vitamin B-12: 513 pg/mL (ref 180–914)

## 2018-12-04 LAB — IRON AND TIBC
Iron: 75 ug/dL (ref 45–182)
Saturation Ratios: 26 % (ref 17.9–39.5)
TIBC: 293 ug/dL (ref 250–450)
UIBC: 218 ug/dL

## 2018-12-04 LAB — FERRITIN: Ferritin: 105 ng/mL (ref 24–336)

## 2018-12-10 ENCOUNTER — Other Ambulatory Visit: Payer: Self-pay

## 2018-12-11 ENCOUNTER — Inpatient Hospital Stay (HOSPITAL_BASED_OUTPATIENT_CLINIC_OR_DEPARTMENT_OTHER): Payer: Medicare Other | Admitting: Hematology

## 2018-12-11 ENCOUNTER — Encounter (HOSPITAL_COMMUNITY): Payer: Self-pay | Admitting: Hematology

## 2018-12-11 DIAGNOSIS — D4620 Refractory anemia with excess of blasts, unspecified: Secondary | ICD-10-CM

## 2018-12-11 DIAGNOSIS — N189 Chronic kidney disease, unspecified: Secondary | ICD-10-CM | POA: Diagnosis not present

## 2018-12-11 DIAGNOSIS — E538 Deficiency of other specified B group vitamins: Secondary | ICD-10-CM | POA: Diagnosis not present

## 2018-12-11 NOTE — Progress Notes (Signed)
[place], [place] [address]   CLINIC:  Medical Oncology/Hematology  PCP:  Sinda Du, MD [place] [address] [phone]   REASON FOR VISIT: Follow-up for low grade myelodysplastic syndrome (MDS) and vitamin B12 deficiency   CURRENT THERAPY: Observation and monthly B12 injections  INTERVAL HISTORY:  [name] 85 y.o. male seen for follow-up of low-grade MDS and B12 deficiency.  He is continuing monthly B12 shots.  Denies any fevers, night sweats or weight loss in the last 6 months.  Denies any infections.  Easy bruising present but no bleeding.  Reportedly had a fall in June but was okay.  He was seen by Dr. Laural Golden and will likely have colonoscopy later this year.  Denies any recent hospitalizations.  No bleeding per rectum or melena.  REVIEW OF SYSTEMS:  Review of Systems  Respiratory: Positive for shortness of breath.   All other systems reviewed and are negative.    PAST MEDICAL/SURGICAL HISTORY:  Past Medical History:  Diagnosis Date  . BPH (benign prostatic hypertrophy)   . Chronic systolic heart failure ([place])   . Coronary artery disease    Multivessel status post CABG 8/06, LIMA-LAD, SVG-CFX, SVG-distal RCA  . Essential hypertension   . Hyperlipidemia   . ICD (implantable cardiac defibrillator) in place    Medtronic  . Ischemic cardiomyopathy   . Myelodysplasia, low grade ([place]) 11/27/2015  . Nephrolithiasis   . Prostate cancer ([place])   . Vitamin B 12 deficiency 07/27/2016   Past Surgical History:  Procedure Laterality Date  . CARDIAC DEFIBRILLATOR PLACEMENT    . COLONOSCOPY    . COLONOSCOPY N/A 08/19/2013   Procedure: COLONOSCOPY;  Surgeon: Rogene Houston, MD;  [facility];  Service: Endoscopy;  Laterality: N/A;  930  . CORONARY ARTERY BYPASS GRAFT     8/06 with left anterior descending artery, saphenous vein graft to circumflex, saphenous vein graft to distal right coronary artery  .  HERNIA REPAIR    . POLYPECTOMY    . PROSTATE SURGERY       SOCIAL HISTORY:  Social History   Socioeconomic History  . Marital status: Married    Spouse name: Not on file  . Number of children: Not on file  . Years of education: Not on file  . Highest education level: Not on file  Occupational History  . Occupation: Retired    Fish farm manager: RETIRED  Social Needs  . Financial resource strain: Not on file  . Food insecurity    Worry: Not on file    Inability: Not on file  . Transportation needs    Medical: Not on file    Non-medical: Not on file  Tobacco Use  . Smoking status: Former Smoker    Packs/day: 0.25    Years: 44.00    Pack years: 11.00    Types: Cigarettes    Quit date: 05/14/1992    Years since quitting: 26.5  . Smokeless tobacco: Never Used  Substance and Sexual Activity  . Alcohol use: No  . Drug use: No  . Sexual activity: Not on file    Comment: married 63 years  Lifestyle  . Physical activity    Days per week: Not on file    Minutes per session: Not on file  . Stress: Not on file  Relationships  . Social Herbalist on phone: Not on file    Gets together: Not on file  Attends religious service: Not on file    Active member of club or organization: Not on file    Attends meetings of clubs or organizations: Not on file    Relationship status: Not on file  . Intimate partner violence    Fear of current or ex partner: Not on file    Emotionally abused: Not on file    Physically abused: Not on file    Forced sexual activity: Not on file  Other Topics Concern  . Not on file  Social History Narrative  . Not on file    FAMILY HISTORY:  Family History  Problem Relation Age of Onset  . Colon cancer Father   . Coronary artery disease Other     CURRENT MEDICATIONS:  Outpatient Encounter Medications as of 12/11/2018  Medication Sig  . acetaminophen (TYLENOL) 500 MG tablet Take 1 tablet (500 mg total) by mouth every 6 (six) hours as needed  for mild pain or fever.  Marland Kitchen allopurinol (ZYLOPRIM) 300 MG tablet Take 300 mg by mouth daily.   Marland Kitchen aspirin 81 MG chewable tablet Chew 81 mg by mouth daily.  . carvedilol (COREG) 12.5 MG tablet Take 12.5 mg by mouth 2 (two) times daily with a meal.  . cetirizine (ZYRTEC) 10 MG tablet Take 10 mg by mouth daily as needed for allergies.   . Cholecalciferol (VITAMIN D3) 1000 UNITS CAPS Take 1 capsule by mouth daily.   . cyanocobalamin (,VITAMIN B-12,) 1000 MCG/ML injection INJECT 1ML INTO THE MUSCLE ONCE AS DIRECTED  . diphenhydrAMINE (BENADRYL) 25 MG tablet Take 25 mg by mouth at bedtime as needed.  . hydrochlorothiazide (HYDRODIURIL) 25 MG tablet TAKE 1/2 TABLET(12.5 MG) BY MOUTH DAILY  . HYDROcodone-acetaminophen (NORCO/VICODIN) 5-325 MG tablet Take one tab po q 4 hrs prn pain  . losartan (COZAAR) 50 MG tablet TAKE 1 AND 1/2 TABLET BY MOUTH DAILY  . simvastatin (ZOCOR) 40 MG tablet TAKE ONE TABLET BY MOUTH EVERY EVENING  . spironolactone (ALDACTONE) 25 MG tablet TAKE 1/2 TABLET(12.5 MG) BY MOUTH DAILY  . vitamin C (ASCORBIC ACID) 500 MG tablet Take 500 mg by mouth daily.     No facility-administered encounter medications on file as of 12/11/2018.     ALLERGIES:  Allergies  Allergen Reactions  . Codeine Nausea And Vomiting     PHYSICAL EXAM:  ECOG Performance status: 1  Vitals:   12/11/18 1127  BP: (!) 148/62  Pulse: 71  Resp: 18  Temp: 98.9 F (37.2 C)  SpO2: 98%   Filed Weights   12/11/18 1127  Weight: 172 lb (78 kg)    Physical Exam Vitals signs reviewed.  Constitutional:      Appearance: Normal appearance. He is normal weight.  Cardiovascular:     Rate and Rhythm: Normal rate and regular rhythm.     Heart sounds: Normal heart sounds.  Pulmonary:     Effort: Pulmonary effort is normal.     Breath sounds: Normal breath sounds.  Abdominal:     General: There is no distension.     Palpations: Abdomen is soft. There is no mass.  Musculoskeletal: Normal range of motion.   Skin:    General: Skin is warm and dry.  Neurological:     General: No focal deficit present.     Mental Status: He is alert and oriented to person, place, and time. Mental status is at baseline.  Psychiatric:        Mood and Affect: Mood normal.  Behavior: Behavior normal.     LABORATORY DATA:  I have reviewed the labs as listed.  CBC    Component Value Date/Time   WBC 2.9 (L) 12/04/2018 1105   RBC 3.70 (L) 12/04/2018 1105   HGB 11.5 (L) 12/04/2018 1105   HCT 36.4 (L) 12/04/2018 1105   PLT 84 (L) 12/04/2018 1105   MCV 98.4 12/04/2018 1105   MCH 31.1 12/04/2018 1105   MCHC 31.6 12/04/2018 1105   RDW 12.9 12/04/2018 1105   LYMPHSABS 0.8 12/04/2018 1105   MONOABS 1.0 12/04/2018 1105   EOSABS 0.0 12/04/2018 1105   BASOSABS 0.0 12/04/2018 1105   CMP Latest Ref Rng & Units 12/04/2018 06/02/2018 11/20/2017  Glucose 70 - 99 mg/dL 154(H) 112(H) 185(H)  BUN 8 - 23 mg/dL 33(H) 29(H) 30(H)  Creatinine 0.61 - 1.24 mg/dL 1.24 1.17 1.20  Sodium 135 - 145 mmol/L 138 138 136  Potassium 3.5 - 5.1 mmol/L 5.0 4.4 4.8  Chloride 98 - 111 mmol/L 105 104 106  CO2 22 - 32 mmol/L '25 27 23  ' Calcium 8.9 - 10.3 mg/dL 9.4 9.2 8.8(L)  Total Protein 6.5 - 8.1 g/dL 6.8 7.0 6.7  Total Bilirubin 0.3 - 1.2 mg/dL 0.8 0.7 0.8  Alkaline Phos 38 - 126 U/L 94 42 47  AST 15 - 41 U/L 13(L) 13(L) 18  ALT 0 - 44 U/L '9 10 11       ' DIAGNOSTIC IMAGING:  I have independently reviewed the scans and discussed with the patient.     ASSESSMENT & PLAN:   Myelodysplasia, low grade ([place]) 1.  Low-grade MDS: -Bone marrow biopsy on 10/27/2015 showed hypercellular marrow with mild dyserythropoiesis and dysmegakaryopoiesis.  Chromosome analysis showed 53 XY with a normal FISH panel.  Flow cytometry on peripheral blood on 09/20/2015 did not show any monoclonal B-cell population. - He has normocytic anemia with hemoglobin ranging between 11 and 12 which is stable. - White count is also stable between 2-3 with ANC  around thousand-1500. - Moderate thrombocytopenia is also stable between 80-90 K. - He does not have any fevers, night sweats or weight loss.  No infections. - He will likely have colonoscopy end of this year. -I will see him back in 6 months for follow-up with repeat blood counts.  2.  B12 deficiency: - His B12 level was 513 on 12/04/2018.  He will continue B12 injections at home.  3.  CKD: - Creatinine is mildly elevated and stable between 1.2-1.3.   Total time spent is 25 minutes with more than 50% of the time spent face-to-face discussing diagnosis, counseling and coordination of care.  Orders placed this encounter:  No orders of the defined types were placed in this encounter.     Derek Jack, MD [place] [phone]

## 2018-12-11 NOTE — Patient Instructions (Signed)
[facility] at [facility] Discharge Instructions  You were seen today by Dr. Katragadda. He went over your recent lab results. He will see you back in 6 months for labs and follow up.   Thank you for choosing [facility] at [facility] to provide your oncology and hematology care.  To afford each patient quality time with our provider, please arrive at least 15 minutes before your scheduled appointment time.   If you have a lab appointment with the Cancer Center please come in thru the  Main Entrance and check in at the main information desk  You need to re-schedule your appointment should you arrive 10 or more minutes late.  We strive to give you quality time with our providers, and arriving late affects you and other patients whose appointments are after yours.  Also, if you no show three or more times for appointments you may be dismissed from the clinic at the providers discretion.     Again, thank you for choosing [facility].  Our hope is that these requests will decrease the amount of time that you wait before being seen by our physicians.       _____________________________________________________________  Should you have questions after your visit to  Cancer Center, please contact our office at [phone] between the hours of 8:00 a.m. and 4:30 p.m.  Voicemails left after 4:00 p.m. will not be returned until the following business day.  For prescription refill requests, have your pharmacy contact our office and allow 72 hours.    Cancer Center Support Programs:   > Cancer Support Group  2nd Tuesday of the month 1pm-2pm, Journey Room    

## 2018-12-11 NOTE — Assessment & Plan Note (Signed)
1.  Low-grade MDS: -Bone marrow biopsy on 10/27/2015 showed hypercellular marrow with mild dyserythropoiesis and dysmegakaryopoiesis.  Chromosome analysis showed 16 XY with a normal FISH panel.  Flow cytometry on peripheral blood on 09/20/2015 did not show any monoclonal B-cell population. - He has normocytic anemia with hemoglobin ranging between 11 and 12 which is stable. - White count is also stable between 2-3 with ANC around thousand-1500. - Moderate thrombocytopenia is also stable between 80-90 K. - He does not have any fevers, night sweats or weight loss.  No infections. - He will likely have colonoscopy end of this year. -I will see him back in 6 months for follow-up with repeat blood counts.  2.  B12 deficiency: - His B12 level was 513 on 12/04/2018.  He will continue B12 injections at home.  3.  CKD: - Creatinine is mildly elevated and stable between 1.2-1.3.

## 2018-12-26 ENCOUNTER — Other Ambulatory Visit (HOSPITAL_COMMUNITY): Payer: Self-pay | Admitting: Hematology

## 2018-12-26 DIAGNOSIS — D61818 Other pancytopenia: Secondary | ICD-10-CM

## 2018-12-29 NOTE — Progress Notes (Signed)
Cardiology Office Note  Date: 12/30/2018   ID: [name], DOB [birth year], MRN [id number]  PCP:  Sinda Du, MD  Cardiologist:  Rozann Lesches, MD Electrophysiologist:  None   Chief Complaint  Patient presents with  . Cardiac follow-up    History of Present Illness: [name] is a 85 y.o. male last seen in February.  He is here today for a routine visit.  Reports no angina symptoms or nitroglycerin use and remains functional with ADLs.  He did have a mechanical fall, tripped while coming into his sunroom from outdoors back in June.  He does not report any major injuries with that, but it did impact his typical walking.  He and his wife plan to go back to walking on a local trail soon.  He has a history of Medtronic ICD placement per Dr. Lovena Le, generator was not replaced at [facility] following discussions and improvement in LVEF.  I reviewed his medications which are outlined below and stable from a cardiac perspective.  He continues to follow with Dr. Luan Pulling for follow-up of lipids.  Past Medical History:  Diagnosis Date  . BPH (benign prostatic hypertrophy)   . Chronic systolic heart failure ([place])   . Coronary artery disease    Multivessel status post CABG 8/06, LIMA-LAD, SVG-CFX, SVG-distal RCA  . Essential hypertension   . Hyperlipidemia   . ICD (implantable cardiac defibrillator) in place    Medtronic  . Ischemic cardiomyopathy   . Myelodysplasia, low grade ([place]) 11/27/2015  . Nephrolithiasis   . Prostate cancer ([place])   . Vitamin B 12 deficiency 07/27/2016    Past Surgical History:  Procedure Laterality Date  . CARDIAC DEFIBRILLATOR PLACEMENT    . COLONOSCOPY    . COLONOSCOPY N/A 08/19/2013   Procedure: COLONOSCOPY;  Surgeon: Rogene Houston, MD;  [facility];  Service: Endoscopy;  Laterality: N/A;  930  . CORONARY ARTERY BYPASS GRAFT     8/06 with left anterior descending artery, saphenous vein graft to circumflex, saphenous vein graft to  distal right coronary artery  . HERNIA REPAIR    . POLYPECTOMY    . PROSTATE SURGERY      Current Outpatient Medications  Medication Sig Dispense Refill  . acetaminophen (TYLENOL) 500 MG tablet Take 1 tablet (500 mg total) by mouth every 6 (six) hours as needed for mild pain or fever. 30 tablet 0  . allopurinol (ZYLOPRIM) 300 MG tablet Take 300 mg by mouth daily.     Marland Kitchen aspirin 81 MG chewable tablet Chew 81 mg by mouth daily.    . carvedilol (COREG) 12.5 MG tablet Take 12.5 mg by mouth 2 (two) times daily with a meal.    . cetirizine (ZYRTEC) 10 MG tablet Take 10 mg by mouth daily as needed for allergies.     . Cholecalciferol (VITAMIN D3) 1000 UNITS CAPS Take 1 capsule by mouth daily.     . cyanocobalamin (,VITAMIN B-12,) 1000 MCG/ML injection ADMINISTER 1 ML IN THE MUSCLE 1 TIME AS DIRECTED 1 mL 14  . diphenhydrAMINE (BENADRYL) 25 MG tablet Take 25 mg by mouth at bedtime as needed.    . hydrochlorothiazide (HYDRODIURIL) 25 MG tablet TAKE 1/2 TABLET(12.5 MG) BY MOUTH DAILY 45 tablet 2  . HYDROcodone-acetaminophen (NORCO/VICODIN) 5-325 MG tablet Take one tab po q 4 hrs prn pain 6 tablet 0  . losartan (COZAAR) 50 MG tablet TAKE 1 AND 1/2 TABLET BY MOUTH DAILY 45 tablet 6  . simvastatin (ZOCOR)  40 MG tablet TAKE ONE TABLET BY MOUTH EVERY EVENING 30 tablet 0  . spironolactone (ALDACTONE) 25 MG tablet TAKE 1/2 TABLET(12.5 MG) BY MOUTH DAILY 45 tablet 3  . vitamin C (ASCORBIC ACID) 500 MG tablet Take 500 mg by mouth daily.       No current facility-administered medications for this visit.    Allergies:  Codeine   Social History: The patient  reports that he quit smoking about 26 years ago. His smoking use included cigarettes. He has a 11.00 pack-year smoking history. He has never used smokeless tobacco. He reports that he does not drink alcohol or use drugs.   ROS:  Please see the history of present illness. Otherwise, complete review of systems is positive for none.  All other systems are  reviewed and negative.   Physical Exam: VS:  BP 114/63   Pulse 62   Temp (!) 97.1 F (36.2 C)   Ht _0  (1.626 m)   Wt 172 lb (78 kg)   BMI 29.52 kg/m , BMI Body mass index is 29.52 kg/m.  Wt Readings from Last 3 Encounters:  12/30/18 172 lb (78 kg)  12/11/18 172 lb (78 kg)  10/31/18 176 lb 5.9 oz (80 kg)    General: Elderly male, appears comfortable at rest. HEENT: Conjunctiva and lids normal, wearing a mask. Neck: Supple, no elevated JVP or carotid bruits, no thyromegaly. Lungs: Clear to auscultation, nonlabored breathing at rest. Cardiac: Regular rate and rhythm, no S3, soft systolic murmur. Abdomen: Soft, nontender, bowel sounds present. Extremities: No pitting edema, distal pulses 2+. Skin: Warm and dry. Musculoskeletal: No kyphosis. Neuropsychiatric: Alert and oriented x3, affect grossly appropriate.  ECG:  An ECG dated 06/23/2018 was personally reviewed today and demonstrated:  Sinus rhythm with right bundle branch block and left anterior fascicular block.  Recent Labwork: 12/04/2018: ALT 9; AST 13; BUN 33; Creatinine, Ser 1.24; Hemoglobin 11.5; Platelets 84; Potassium 5.0; Sodium 138   Other Studies Reviewed Today:  Carotid Dopplers 10/24/2016: Stable 1-39% bilateral ICA stenoses.  Assessment and Plan:  1.  Multivessel CAD status post CABG in 2006.  We are following him conservatively in the absence of angina symptoms on medical therapy.  Continue aspirin and statin.  2.  Mixed hyperlipidemia on Zocor.  He continues to follow regularly with Dr. Luan Pulling.  3.  Ischemic cardiomyopathy with LVEF 45 to 50% range.  Medtronic ICD met ERI and generator was not changed per discussions with Dr. Lovena Le.  He does not report any palpitations or syncope.  Medication Adjustments/Labs and Tests Ordered: Current medicines are reviewed at length with the patient today.  Concerns regarding medicines are outlined above.   Tests Ordered: No orders of the defined types were placed  in this encounter.   Medication Changes: No orders of the defined types were placed in this encounter.   Disposition:  Follow up 6 months in the [place] office.  Signed, Satira Sark, MD, [facility] 12/30/2018 11:48 AM    [place] [facility] [place]. [address], [place], [place] [address] Phone: [phone]; Fax: [phone]

## 2018-12-30 ENCOUNTER — Ambulatory Visit (INDEPENDENT_AMBULATORY_CARE_PROVIDER_SITE_OTHER): Payer: Medicare Other | Admitting: Cardiology

## 2018-12-30 ENCOUNTER — Encounter: Payer: Self-pay | Admitting: Cardiology

## 2018-12-30 ENCOUNTER — Other Ambulatory Visit: Payer: Self-pay

## 2018-12-30 VITALS — BP 114/63 | HR 62 | Temp 97.1°F | Ht 64.0 in | Wt 172.0 lb

## 2018-12-30 DIAGNOSIS — E782 Mixed hyperlipidemia: Secondary | ICD-10-CM

## 2018-12-30 DIAGNOSIS — I25119 Atherosclerotic heart disease of native coronary artery with unspecified angina pectoris: Secondary | ICD-10-CM | POA: Diagnosis not present

## 2018-12-30 DIAGNOSIS — I255 Ischemic cardiomyopathy: Secondary | ICD-10-CM | POA: Diagnosis not present

## 2018-12-30 NOTE — Patient Instructions (Signed)
Medication Instructions: Your physician recommends that you continue on your current medications as directed. Please refer to the Current Medication list given to you today.   Labwork: none  Procedures/Testing: none  Follow-Up: 6 months with Dr.McDowell  Any Additional Special Instructions Will Be Listed Below (If Applicable).     If you need a refill on your cardiac medications before your next appointment, please call your pharmacy.      Thank you for choosing [facility] !        

## 2019-01-06 ENCOUNTER — Telehealth (INDEPENDENT_AMBULATORY_CARE_PROVIDER_SITE_OTHER): Payer: Self-pay | Admitting: *Deleted

## 2019-01-06 NOTE — Telephone Encounter (Signed)
Pt was on recall for me to remind you to: talk with patient over the phone in September this year before colonoscopy scheduled.

## 2019-02-02 DIAGNOSIS — L570 Actinic keratosis: Secondary | ICD-10-CM | POA: Diagnosis not present

## 2019-02-03 DIAGNOSIS — R5381 Other malaise: Secondary | ICD-10-CM | POA: Diagnosis not present

## 2019-02-03 DIAGNOSIS — M79602 Pain in left arm: Secondary | ICD-10-CM | POA: Diagnosis not present

## 2019-02-05 DIAGNOSIS — N183 Chronic kidney disease, stage 3 (moderate): Secondary | ICD-10-CM | POA: Diagnosis not present

## 2019-02-05 DIAGNOSIS — S41112A Laceration without foreign body of left upper arm, initial encounter: Secondary | ICD-10-CM | POA: Diagnosis not present

## 2019-02-05 DIAGNOSIS — I2510 Atherosclerotic heart disease of native coronary artery without angina pectoris: Secondary | ICD-10-CM | POA: Diagnosis not present

## 2019-02-05 DIAGNOSIS — I10 Essential (primary) hypertension: Secondary | ICD-10-CM | POA: Diagnosis not present

## 2019-02-19 DIAGNOSIS — Z23 Encounter for immunization: Secondary | ICD-10-CM | POA: Diagnosis not present

## 2019-03-02 ENCOUNTER — Other Ambulatory Visit: Payer: Self-pay | Admitting: *Deleted

## 2019-03-02 MED ORDER — LOSARTAN POTASSIUM 50 MG PO TABS: 75.0000 mg | ORAL_TABLET | Freq: Every day | ORAL | 6 refills | Status: DC

## 2019-03-02 NOTE — Telephone Encounter (Signed)
I talked with patient over the phone. He has no GI symptoms. He has history of colonic adenomas and history of colon carcinoma in his father. Last colonoscopy was in April 2015. He has perfectly fine not doing any more colonoscopies unless he were to have symptoms.

## 2019-05-13 ENCOUNTER — Other Ambulatory Visit: Payer: Self-pay

## 2019-05-13 MED ORDER — HYDROCHLOROTHIAZIDE 25 MG PO TABS: ORAL_TABLET | ORAL | 2 refills | Status: DC

## 2019-05-26 ENCOUNTER — Encounter: Payer: Self-pay | Admitting: Family Medicine

## 2019-05-26 ENCOUNTER — Ambulatory Visit (INDEPENDENT_AMBULATORY_CARE_PROVIDER_SITE_OTHER): Payer: Medicare Other | Admitting: Family Medicine

## 2019-05-26 ENCOUNTER — Other Ambulatory Visit: Payer: Self-pay

## 2019-05-26 VITALS — BP 120/62 | HR 70 | Temp 97.7°F | Ht 64.0 in | Wt 172.0 lb

## 2019-05-26 DIAGNOSIS — C61 Malignant neoplasm of prostate: Secondary | ICD-10-CM | POA: Diagnosis not present

## 2019-05-26 DIAGNOSIS — E538 Deficiency of other specified B group vitamins: Secondary | ICD-10-CM | POA: Diagnosis not present

## 2019-05-26 DIAGNOSIS — E7849 Other hyperlipidemia: Secondary | ICD-10-CM

## 2019-05-26 DIAGNOSIS — I10 Essential (primary) hypertension: Secondary | ICD-10-CM | POA: Diagnosis not present

## 2019-05-26 DIAGNOSIS — D4620 Refractory anemia with excess of blasts, unspecified: Secondary | ICD-10-CM

## 2019-05-26 DIAGNOSIS — I6529 Occlusion and stenosis of unspecified carotid artery: Secondary | ICD-10-CM | POA: Diagnosis not present

## 2019-05-26 DIAGNOSIS — M109 Gout, unspecified: Secondary | ICD-10-CM | POA: Insufficient documentation

## 2019-05-26 NOTE — Patient Instructions (Addendum)
   If you have lab work done today you will be contacted with your lab results within the next 2 weeks.  If you have not heard from us then please contact us. The fastest way to get your results is to register for My Chart.   IF you received an x-ray today, you will receive an invoice from [facility]. Please contact [facility] at [phone] with questions or concerns regarding your invoice.   IF you received labwork today, you will receive an invoice from [facility]. Please contact [facility] at [phone] with questions or concerns regarding your invoice.   Our billing staff will not be able to assist you with questions regarding bills from these companies.  You will be contacted with the lab results as soon as they are available. The fastest way to get your results is to activate your My Chart account. Instructions are located on the last page of this paperwork. If you have not heard from us regarding the results in 2 weeks, please contact this office.     Earwax Buildup, Adult The ears produce a substance called earwax that helps keep bacteria out of the ear and protects the skin in the ear canal. Occasionally, earwax can build up in the ear and cause discomfort or hearing loss. What increases the risk? This condition is more likely to develop in people who:  Are male.  Are elderly.  Naturally produce more earwax.  Clean their ears often with cotton swabs.  Use earplugs often.  Use in-ear headphones often.  Wear hearing aids.  Have narrow ear canals.  Have earwax that is overly thick or sticky.  Have eczema.  Are dehydrated.  Have excess hair in the ear canal. What are the signs or symptoms? Symptoms of this condition include:  Reduced or muffled hearing.  A feeling of fullness in the ear or feeling that the ear is plugged.  Fluid coming from the ear.  Ear pain.  Ear itch.  Ringing in the ear.  Coughing.  An obvious piece of earwax  that can be seen inside the ear canal. How is this diagnosed? This condition may be diagnosed based on:  Your symptoms.  Your medical history.  An ear exam. During the exam, your health care provider will look into your ear with an instrument called an otoscope. You may have tests, including a hearing test. How is this treated? This condition may be treated by:  Using ear drops to soften the earwax.  Having the earwax removed by a health care provider. The health care provider may: ? Flush the ear with water. ? Use an instrument that has a loop on the end (curette). ? Use a suction device.  Surgery to remove the wax buildup. This may be done in severe cases. Follow these instructions at home:   Take over-the-counter and prescription medicines only as told by your health care provider.  Do not put any objects, including cotton swabs, into your ear. You can clean the opening of your ear canal with a washcloth or facial tissue.  Follow instructions from your health care provider about cleaning your ears. Do not over-clean your ears.  Drink enough fluid to keep your urine clear or pale yellow. This will help to thin the earwax.  Keep all follow-up visits as told by your health care provider. If earwax builds up in your ears often or if you use hearing aids, consider seeing your health care provider for routine, preventive ear cleanings. Ask   Ask your health care provider how often you should schedule your cleanings.  If you have hearing aids, clean them according to instructions from the manufacturer and your health care provider. Contact a health care provider if:  You have ear pain.  You develop a fever.  You have blood, pus, or other fluid coming from your ear.  You have hearing loss.  You have ringing in your ears that does not go away.  Your symptoms do not improve with treatment.  You feel like the room is spinning (vertigo). Summary  Earwax can build up in the  ear and cause discomfort or hearing loss.  The most common symptoms of this condition include reduced or muffled hearing and a feeling of fullness in the ear or feeling that the ear is plugged.  This condition may be diagnosed based on your symptoms, your medical history, and an ear exam.  This condition may be treated by using ear drops to soften the earwax or by having the earwax removed by a health care provider.  Do not put any objects, including cotton swabs, into your ear. You can clean the opening of your ear canal with a washcloth or facial tissue. This information is not intended to replace advice given to you by your health care provider. Make sure you discuss any questions you have with your health care provider. Document Revised: 04/12/2017 Document Reviewed: 07/11/2016 Elsevier Patient Education  2020 [employer].  Have lipid panel completed with other labwork-FASTING Take zyrtec as needed for allergies and cough Keep appointment with hematology Follow up with GI as recommended for polyps

## 2019-05-26 NOTE — Progress Notes (Addendum)
New Patient Office Visit  Subjective:  Patient ID: [name], male    DOB: [birth year]  Age: 86 y.o. MRN: [id number]  CC:  Chief Complaint  Patient presents with  . Establish Care    Patient is here to est care with no other issues at this time  cerumen-right ear, cleaned out left ear at home Old records reviewed HPI [name] presents for BPH/Prostate CA-no longer seeing urology-cryo and lupron for treatment Cardiology-CHF/h/o bypass surgery-MI 94 CHF-cardio following-yearly-1.  Multivessel CAD status post CABG in 2006.  We are following him conservatively in the absence of angina symptoms on medical therapy.  Continue aspirin and statin. 2.  Mixed hyperlipidemia on Zocor. 3.  Ischemic cardiomyopathy with LVEF 45 to 50% range.  Medtronic ICD met ERI and generator was not changed per discussions with Dr. Lovena Le.  He does not report any palpitations or syncope. (pt does not want replaced-no currently working) carotid Heterogeneous plaque, bilaterally. Stable 1-39% ICA stenosis, bilaterally. > 50% stenosis right ECA. Normal subclavian arteries, bilaterally. Patent vertebral arteries with antegrade flow. Heterogeneous plaque, bilaterally. Stable 1-39% ICA stenosis, bilaterally. > 50% stenosis right ECA. Normal subclavian arteries, bilaterally. Patent vertebral arteries with antegrade flow. ONCOLOGY/HEMATOLOGY Myelodysplasia, low grade ([place]) 1.  Low-grade MDS: -Bone marrow biopsy on 10/27/2015 showed hypercellular marrow with mild dyserythropoiesis and dysmegakaryopoiesis.  Chromosome analysis showed 77 XY with a normal FISH panel.  Flow cytometry on peripheral blood on 09/20/2015 did not show any monoclonal B-cell population. - He has normocytic anemia with hemoglobin ranging between 11 and 12 which is stable. - White count is also stable between 2-3 with ANC around thousand-1500. - Moderate thrombocytopenia is also stable between 80-90 K. - He does not have any fevers, night  sweats or weight loss.  No infections. - He will likely have colonoscopy end of this year. -I will see him back in 6 months for follow-up with repeat blood counts. 2.  B12 deficiency: - His B12 level was 513 on 12/04/2018.  He will continue B12 injections at home. 3.  CKD: - Creatinine is mildly elevated and stable between 1.2-1.3. Gastro Patient is 86 year old Caucasian male who has a history of multiple colonic adenomas and is here for scheduled visit. He had multiple colonic adenomas removed on his colonoscopy of February 2010 and he had 4 polyps removed on his last colonoscopy of April 2015.  2 of these polyps are tubular adenomas. Because of his age it was decided to see him in the office rather than scheduling another colonoscopy. Family history significant for colon carcinoma in his father who was 84 at 84 years old at the time of diagnosis and died in less than 2 years of metastatic disease.  Past Medical History:  Diagnosis Date  . BPH (benign prostatic hypertrophy)   . Chronic systolic heart failure ([place])   . Coronary artery disease    Multivessel status post CABG 8/06, LIMA-LAD, SVG-CFX, SVG-distal RCA  . Essential hypertension   . Hyperlipidemia   . ICD (implantable cardiac defibrillator) in place    Medtronic  . Ischemic cardiomyopathy   . Myelodysplasia, low grade ([place]) 11/27/2015  . Nephrolithiasis   . Prostate cancer ([place])   . Vitamin B 12 deficiency 07/27/2016    Past Surgical History:  Procedure Laterality Date  . CARDIAC DEFIBRILLATOR PLACEMENT    . COLONOSCOPY    . COLONOSCOPY N/A 08/19/2013   Procedure: COLONOSCOPY;  Surgeon: Rogene Houston, MD;  Location: [facility];  Service:  Endoscopy;  Laterality: N/A;  930  . CORONARY ARTERY BYPASS GRAFT     8/06 with left anterior descending artery, saphenous vein graft to circumflex, saphenous vein graft to distal right coronary artery  . HERNIA REPAIR    . POLYPECTOMY    . PROSTATE SURGERY      Family History   Problem Relation Age of Onset  . Colon cancer Father   . Coronary artery disease Other     Social History   Socioeconomic History  . Marital status: Married    Spouse name: Not on file  . Number of children: Not on file  . Years of education: Not on file  . Highest education level: Not on file  Occupational History  . Occupation: Retired    Fish farm manager: RETIRED  Tobacco Use  . Smoking status: Former Smoker    Packs/day: 0.25    Years: 44.00    Pack years: 11.00    Types: Cigarettes    Quit date: 05/14/1992    Years since quitting: 27.0  . Smokeless tobacco: Never Used  Substance and Sexual Activity  . Alcohol use: No  . Drug use: No  . Sexual activity: Not on file    Comment: married 63 years  Other Topics Concern  . Not on file  Social History Narrative  . Not on file   Social Determinants of Health   Financial Resource Strain:   . Difficulty of Paying Living Expenses: Not on file  Food Insecurity:   . Worried About Charity fundraiser in the Last Year: Not on file  . Ran Out of Food in the Last Year: Not on file  Transportation Needs:   . Lack of Transportation (Medical): Not on file  . Lack of Transportation (Non-Medical): Not on file  Physical Activity:   . Days of Exercise per Week: Not on file  . Minutes of Exercise per Session: Not on file  Stress:   . Feeling of Stress : Not on file  Social Connections:   . Frequency of Communication with Friends and Family: Not on file  . Frequency of Social Gatherings with Friends and Family: Not on file  . Attends Religious Services: Not on file  . Active Member of Clubs or Organizations: Not on file  . Attends Archivist Meetings: Not on file  . Marital Status: Not on file  Intimate Partner Violence:   . Fear of Current or Ex-Partner: Not on file  . Emotionally Abused: Not on file  . Physically Abused: Not on file  . Sexually Abused: Not on file    ROS Review of Systems  Constitutional: Negative.    HENT: Positive for dental problem, hearing loss, rhinorrhea and sneezing.        Hearing aids bilat Cerumen -cleared out of left ear  Eyes:       Glasses-sees eye specialist yearly  Respiratory: Positive for cough.        Occasional dry cough  Cardiovascular:       MI with bypass Sees cardio yearly  Gastrointestinal: Negative.   Endocrine: Negative.   Genitourinary:       Prostate cancer-cryo/lupronBPH-no meds nocturia  Musculoskeletal: Negative.        Gout  Skin:       Derm yearly-evaluation  Allergic/Immunologic: Positive for environmental allergies.       Took allergy shots in the past Benadryl for itch Zyrtec prn  Neurological: Negative.   Hematological:  Sees hematology for evaluation-low wbc,low platelets, anemia Takes B12 injections  Psychiatric/Behavioral: Negative.     Objective:   Today's Vitals: BP 120/62 (BP Location: Right Arm, Patient Position: Sitting, Cuff Size: Normal)   Pulse 70   Temp 97.7 F (36.5 C) (Oral)   Ht '5\' 4"'  (1.626 m)   Wt 172 lb (78 kg)   SpO2 95%   BMI 29.52 kg/m   Physical Exam Vitals reviewed.  Constitutional:      Appearance: Normal appearance. He is normal weight.  HENT:     Head: Normocephalic and atraumatic.     Right Ear: There is impacted cerumen.     Left Ear: Tympanic membrane, ear canal and external ear normal.     Ears:     Comments: Right TM-removed cerumen, TM +LR Eyes:     Conjunctiva/sclera: Conjunctivae normal.  Cardiovascular:     Rate and Rhythm: Normal rate and regular rhythm.     Pulses: Normal pulses.     Heart sounds: Normal heart sounds.  Pulmonary:     Effort: Pulmonary effort is normal.     Breath sounds: Normal breath sounds.  Musculoskeletal:     Cervical back: Normal range of motion and neck supple.     Right lower leg: No edema.     Left lower leg: No edema.  Neurological:     Mental Status: He is alert and oriented to person, place, and time.  Psychiatric:        Mood and Affect:  Mood normal.        Behavior: Behavior normal.     Assessment & Plan:   1. Other hyperlipidemia - lipid panel 1/19-normal zocor nightly-will draw lipid panel with other labwork-lft pending-h/o CAD with bypass - Lipid panel  2. Vitamin B 12 deficiency Hematology following  3. Stenosis of carotid artery, unspecified laterality Cardio following-reviewed report  4. PROSTATE CANCER No longer seeing urology, BPH, no meds-PSA-none drawn 19 or 20  5. Myelodysplasia, low grade ([place]) Hematology following  6. Essential hypertension Coreg/HCTZ/cozaar-renal function with other labwork-19-1.2 Cr 7. Gout involving toe, unspecified cause, unspecified chronicity, unspecified laterality Allopurinol-no recent concern Outpatient Encounter Medications as of 05/26/2019  Medication Sig  . acetaminophen (TYLENOL) 500 MG tablet Take 1 tablet (500 mg total) by mouth every 6 (six) hours as needed for mild pain or fever.  Marland Kitchen allopurinol (ZYLOPRIM) 300 MG tablet Take 300 mg by mouth daily.   Marland Kitchen aspirin 81 MG chewable tablet Chew 81 mg by mouth daily.  . carvedilol (COREG) 12.5 MG tablet Take 12.5 mg by mouth 2 (two) times daily with a meal.  . cetirizine (ZYRTEC) 10 MG tablet Take 10 mg by mouth daily as needed for allergies.   . Cholecalciferol (VITAMIN D3) 1000 UNITS CAPS Take 1 capsule by mouth daily.   . cyanocobalamin (,VITAMIN B-12,) 1000 MCG/ML injection ADMINISTER 1 ML IN THE MUSCLE 1 TIME AS DIRECTED  . diphenhydrAMINE (BENADRYL) 25 MG tablet Take 25 mg by mouth at bedtime as needed.  . hydrochlorothiazide (HYDRODIURIL) 25 MG tablet TAKE 1/2 TABLET(12.5 MG) BY MOUTH DAILY  . HYDROcodone-acetaminophen (NORCO/VICODIN) 5-325 MG tablet Take one tab po q 4 hrs prn pain  . losartan (COZAAR) 50 MG tablet Take 1.5 tablets (75 mg total) by mouth daily.  . simvastatin (ZOCOR) 40 MG tablet TAKE ONE TABLET BY MOUTH EVERY EVENING  . spironolactone (ALDACTONE) 25 MG tablet TAKE 1/2 TABLET(12.5 MG) BY MOUTH DAILY   . vitamin C (ASCORBIC ACID) 500 MG tablet  Take 500 mg by mouth daily.     No facility-administered encounter medications on file as of 05/26/2019.  65 minutes spent in discussion-past history, review of old medical records, specialty reports, history, physical exam, assessment, plan, cerumen impaction removal Follow-up: 6 months Lipid panel with other labwork  [place] Syler Hannah Beat, MD

## 2019-06-04 DIAGNOSIS — R0789 Other chest pain: Secondary | ICD-10-CM | POA: Diagnosis not present

## 2019-06-04 DIAGNOSIS — R079 Chest pain, unspecified: Secondary | ICD-10-CM | POA: Diagnosis not present

## 2019-06-05 ENCOUNTER — Telehealth: Payer: Self-pay

## 2019-06-05 NOTE — Telephone Encounter (Signed)
Noted.  Please schedule an office visit for further discussion.

## 2019-06-05 NOTE — Telephone Encounter (Signed)
I spoke with patient, he will come Monday, 06/08/2019 at 1230 to see M.Lenze, PA-C

## 2019-06-05 NOTE — Telephone Encounter (Signed)
According to wife,patient had eaten breakfast, walked into bathroom to brush teeth. She noted that his walk back to the kitchen was "heavy", almost stomping the floor.[name] also states at that time he had blurred vision and saw spots. He then noted his chest was hurting.Denied N/V, diaphoresis, dizziness. [name] called [id number] and they assessed patient and left.The whole episode lasted about an hour. She was told by EMS his blood pressure dropped when he stood up but wife doesn't know what reading was.   Today he feels fine, BP 126/57, HR 66 sitting, 138/69, HR 68, standing

## 2019-06-05 NOTE — Telephone Encounter (Signed)
-----   Message from Holley Dexter sent at 06/05/2019  9:50 AM EST ----- Regarding: Pt asking if appt is needed Per Ms.Asfaw Is appt needed/or you may call Mr. Queen Slough had blurred vision/CP.  EMS was called. EMS stated Pt only had BP drop and to make Cardiologist aware of episode.  Pt Phone [phone]  Thanks renee

## 2019-06-08 ENCOUNTER — Encounter: Payer: Self-pay | Admitting: Physician Assistant

## 2019-06-08 ENCOUNTER — Ambulatory Visit (INDEPENDENT_AMBULATORY_CARE_PROVIDER_SITE_OTHER): Payer: Medicare Other | Admitting: Physician Assistant

## 2019-06-08 VITALS — BP 137/63 | HR 63 | Temp 98.1°F | Ht 64.0 in | Wt 171.0 lb

## 2019-06-08 DIAGNOSIS — R42 Dizziness and giddiness: Secondary | ICD-10-CM | POA: Diagnosis not present

## 2019-06-08 DIAGNOSIS — I25810 Atherosclerosis of coronary artery bypass graft(s) without angina pectoris: Secondary | ICD-10-CM | POA: Diagnosis not present

## 2019-06-08 DIAGNOSIS — R0789 Other chest pain: Secondary | ICD-10-CM

## 2019-06-08 DIAGNOSIS — I255 Ischemic cardiomyopathy: Secondary | ICD-10-CM | POA: Diagnosis not present

## 2019-06-08 DIAGNOSIS — I10 Essential (primary) hypertension: Secondary | ICD-10-CM

## 2019-06-08 DIAGNOSIS — E785 Hyperlipidemia, unspecified: Secondary | ICD-10-CM | POA: Diagnosis not present

## 2019-06-08 MED ORDER — NITROGLYCERIN 0.4 MG SL SUBL: 0.4000 mg | SUBLINGUAL_TABLET | SUBLINGUAL | 3 refills | Status: DC | PRN

## 2019-06-08 NOTE — Progress Notes (Signed)
Cardiology Office Note    Date:  06/08/2019   ID:  [name], DOB [birth year], MRN [id number]  PCP:  Maryruth Hancock, MD  Cardiologist: Rozann Lesches, MD EPS: None  Chief Complaint  Patient presents with  . Chest Pain    History of Present Illness:  [name] is a 86 y.o. male with history of CAD S/P CABG 2006, HTN, HLD ICM EF 45-50%, ICD not replaced with improvement of LVEF and discussions with Dr. Lovena Le.  LOV Dr. Domenic Polite 12/2018 doing well.  Last Thursday after lunch Patient had gone to the bathroom to brush his teeth. He became dizzy with blurred vision and then developed chest pain. EMS checked him and thought his BP dropped but he was stable and didn't go to the hospital. They think BP was 127/62 when ems was there. 2 ASA took pain away. No NTG. Since then he feels great.  No further dizziness or chest pain.    Past Medical History:  Diagnosis Date  . BPH (benign prostatic hypertrophy)   . Chronic systolic heart failure ([place])   . Coronary artery disease    Multivessel status post CABG 8/06, LIMA-LAD, SVG-CFX, SVG-distal RCA  . Essential hypertension   . Hyperlipidemia   . ICD (implantable cardiac defibrillator) in place    [employer]  . Ischemic cardiomyopathy   . Myelodysplasia, low grade ([place]) 11/27/2015  . Nephrolithiasis   . Prostate cancer ([place])   . Vitamin B 12 deficiency 07/27/2016    Past Surgical History:  Procedure Laterality Date  . CARDIAC DEFIBRILLATOR PLACEMENT    . COLONOSCOPY    . COLONOSCOPY N/A 08/19/2013   Procedure: COLONOSCOPY;  Surgeon: Rogene Houston, MD;  Location: [facility];  Service: Endoscopy;  Laterality: N/A;  930  . CORONARY ARTERY BYPASS GRAFT     8/06 with left anterior descending artery, saphenous vein graft to circumflex, saphenous vein graft to distal right coronary artery  . HERNIA REPAIR    . POLYPECTOMY    . PROSTATE SURGERY      Current Medications: Current Meds  Medication Sig  . acetaminophen (TYLENOL)  500 MG tablet Take 1 tablet (500 mg total) by mouth every 6 (six) hours as needed for mild pain or fever.  Marland Kitchen allopurinol (ZYLOPRIM) 300 MG tablet Take 300 mg by mouth daily.   Marland Kitchen aspirin 81 MG chewable tablet Chew 81 mg by mouth daily.  . carvedilol (COREG) 12.5 MG tablet Take 12.5 mg by mouth 2 (two) times daily with a meal.  . cetirizine (ZYRTEC) 10 MG tablet Take 10 mg by mouth daily as needed for allergies.   . Cholecalciferol (VITAMIN D3) 1000 UNITS CAPS Take 1 capsule by mouth daily.   . cyanocobalamin (,VITAMIN B-12,) 1000 MCG/ML injection ADMINISTER 1 ML IN THE MUSCLE 1 TIME AS DIRECTED  . diphenhydrAMINE (BENADRYL) 25 MG tablet Take 25 mg by mouth at bedtime as needed.  . hydrochlorothiazide (HYDRODIURIL) 25 MG tablet TAKE 1/2 TABLET(12.5 MG) BY MOUTH DAILY  . losartan (COZAAR) 50 MG tablet Take 1.5 tablets (75 mg total) by mouth daily.  . simvastatin (ZOCOR) 40 MG tablet TAKE ONE TABLET BY MOUTH EVERY EVENING  . spironolactone (ALDACTONE) 25 MG tablet TAKE 1/2 TABLET(12.5 MG) BY MOUTH DAILY  . vitamin C (ASCORBIC ACID) 500 MG tablet Take 500 mg by mouth daily.       Allergies:   Codeine   Social History   Socioeconomic History  . Marital status: Married  Spouse name: Not on file  . Number of children: Not on file  . Years of education: Not on file  . Highest education level: Not on file  Occupational History  . Occupation: Retired    Fish farm manager: RETIRED  Tobacco Use  . Smoking status: Former Smoker    Packs/day: 0.25    Years: 44.00    Pack years: 11.00    Types: Cigarettes    Quit date: 05/14/1992    Years since quitting: 27.0  . Smokeless tobacco: Never Used  Substance and Sexual Activity  . Alcohol use: No  . Drug use: No  . Sexual activity: Not on file    Comment: married 63 years  Other Topics Concern  . Not on file  Social History Narrative  . Not on file   Social Determinants of Health   Financial Resource Strain:   . Difficulty of Paying Living  Expenses: Not on file  Food Insecurity:   . Worried About Charity fundraiser in the Last Year: Not on file  . Ran Out of Food in the Last Year: Not on file  Transportation Needs:   . Lack of Transportation (Medical): Not on file  . Lack of Transportation (Non-Medical): Not on file  Physical Activity:   . Days of Exercise per Week: Not on file  . Minutes of Exercise per Session: Not on file  Stress:   . Feeling of Stress : Not on file  Social Connections:   . Frequency of Communication with Friends and Family: Not on file  . Frequency of Social Gatherings with Friends and Family: Not on file  . Attends Religious Services: Not on file  . Active Member of Clubs or Organizations: Not on file  . Attends Archivist Meetings: Not on file  . Marital Status: Not on file     Family History:  The patient's   family history includes Colon cancer in his father; Coronary artery disease in an other family member.   ROS:   Please see the history of present illness.    ROS All other systems reviewed and are negative.   PHYSICAL EXAM:   VS:  BP 137/63   Pulse 63   Temp 98.1 F (36.7 C) (Temporal)   Ht '5\' 4"'  (1.626 m)   Wt 171 lb (77.6 kg)   BMI 29.35 kg/m   Physical Exam  GEN: Well nourished, well developed, in no acute distress  Neck: no JVD, carotid bruits, or masses Cardiac:RRR; no murmurs, rubs, or gallops  Respiratory:  clear to auscultation bilaterally, normal work of breathing GI: soft, nontender, nondistended, + BS Ext: without cyanosis, clubbing, or edema, Good distal pulses bilaterally Neuro:  Alert and Oriented x 3 Psych: euthymic mood, full affect  Wt Readings from Last 3 Encounters:  06/08/19 171 lb (77.6 kg)  05/26/19 172 lb (78 kg)  12/30/18 172 lb (78 kg)      Studies/Labs Reviewed:   EKG:  EKG is  ordered today.  The ekg ordered today demonstrates normal sinus rhythm with right bundle branch block and left axis bifascicular block, no acute change from  last tracing  Recent Labs: 12/04/2018: ALT 9; BUN 33; Creatinine, Ser 1.24; Hemoglobin 11.5; Platelets 84; Potassium 5.0; Sodium 138   Lipid Panel    Component Value Date/Time   CHOL 130 06/04/2017 0000   TRIG 127 06/04/2017 0000   HDL 36 06/04/2017 0000   CHOLHDL 4 09/09/2014 1123   VLDL 25.0 09/09/2014 1123  [place] 73 06/04/2017 0000    Additional studies/ records that were reviewed today include:   Echo 2016 LVEF 45-50% with grade 1 DD AK basallateral wall.     ASSESSMENT:    1. Other chest pain   2. Coronary artery disease involving coronary bypass graft of native heart without angina pectoris   3. Dizziness   4. Ischemic cardiomyopathy   5. Hyperlipidemia, unspecified hyperlipidemia type   6. Essential hypertension      PLAN:  In order of problems listed above:  Dizziness with chest pain last Thursday was short-lived and relieved with 3 aspirin.  EMS evaluated and did not think he needed to go to the hospital.  He has felt well since then.  Difficult to discern what caused his symptoms last week but could have been low BP or arrythmia.  He would like to hold off on any stress testing or Holter monitoring unless he has recurrence.  I think this is reasonable.  We will check surveillance labs including CBC and C met and fasting lipid panel tomorrow.  CAD S/P CABG 2006 on ASA & statin 1 episode of chest tightness last week this is the first occurrence since his bypass.  He will call us if he has any further chest pain.  Follow-up with Dr. Domenic Polite in 6 weeks.  ICM EF 45-50% ICD not [facility] at [facility] with improved LVEF and discussion with Dr. Benjamine Sprague have had an arrhythmia but would like to hold off on wearing a monitor.  HLD on zocor check fasting lipid panel tomorrow.  HTN blood pressure stable.    Medication Adjustments/Labs and Tests Ordered: Current medicines are reviewed at length with the patient today.  Concerns regarding medicines are outlined above.   Medication changes, Labs and Tests ordered today are listed in the Patient Instructions below. Patient Instructions  Medication Instructions:  Use nitroglycerin as directed for chaet pain *If you need a refill on your cardiac medications before your next appointment, please call your pharmacy*  Lab Work: Cbc,cmrt,lipids If you have labs (blood work) drawn today and your tests are completely normal, you will receive your results only by: Marland Kitchen MyChart Message (if you have MyChart) OR . A paper copy in the mail If you have any lab test that is abnormal or we need to change your treatment, we will call you to review the results.  Testing/Procedures: NONE  Follow-Up: At [facility], you and your health needs are our priority.  As part of our continuing mission to provide you with exceptional heart care, we have created designated Provider Care Teams.  These Care Teams include your primary Cardiologist (physician) and Advanced Practice Providers (APPs -  Physician Assistants and Nurse Practitioners) who all work together to provide you with the care you need, when you need it.  Your next appointment:   6-8 week(s)  The format for your next appointment:   In Person  Provider:   Rozann Lesches, MD  Other Instructions None    Thank you for choosing [facility] !        Nitroglycerin sublingual tablets What is this medicine? NITROGLYCERIN (nye troe GLI ser in) is a type of vasodilator. It relaxes blood vessels, increasing the blood and oxygen supply to your heart. This medicine is used to relieve chest pain caused by angina. It is also used to prevent chest pain before activities like climbing stairs, going outdoors in cold weather, or sexual activity. This medicine may be used for  other purposes; ask your health care provider or pharmacist if you have questions. COMMON BRAND NAME(S): Nitroquick, Nitrostat, Nitrotab What should I tell my health care  provider before I take this medicine? They need to know if you have any of these conditions:  anemia  head injury, recent stroke, or bleeding in the brain  liver disease  previous heart attack  an unusual or allergic reaction to nitroglycerin, other medicines, foods, dyes, or preservatives  pregnant or trying to get pregnant  breast-feeding How should I use this medicine? Take this medicine by mouth as needed. At the first sign of an angina attack (chest pain or tightness) place one tablet under your tongue. You can also take this medicine 5 to 10 minutes before an event likely to produce chest pain. Follow the directions on the prescription label. Let the tablet dissolve under the tongue. Do not swallow whole. Replace the dose if you accidentally swallow it. It will help if your mouth is not dry. Saliva around the tablet will help it to dissolve more quickly. Do not eat or drink, smoke or chew tobacco while a tablet is dissolving. If you are not better within 5 minutes after taking ONE dose of nitroglycerin, call 9-1-1 immediately to seek emergency medical care. Do not take more than 3 nitroglycerin tablets over 15 minutes. If you take this medicine often to relieve symptoms of angina, your doctor or health care professional may provide you with different instructions to manage your symptoms. If symptoms do not go away after following these instructions, it is important to call 9-1-1 immediately. Do not take more than 3 nitroglycerin tablets over 15 minutes. Talk to your pediatrician regarding the use of this medicine in children. Special care may be needed. Overdosage: If you think you have taken too much of this medicine contact a poison control center or emergency room at once. NOTE: This medicine is only for you. Do not share this medicine with others. What if I miss a dose? This does not apply. This medicine is only used as needed. What may interact with this medicine? Do not take this  medicine with any of the following medications:  certain migraine medicines like ergotamine and dihydroergotamine (DHE)  medicines used to treat erectile dysfunction like sildenafil, tadalafil, and vardenafil  riociguat This medicine may also interact with the following medications:  alteplase  aspirin  heparin  medicines for high blood pressure  medicines for mental depression  other medicines used to treat angina  phenothiazines like chlorpromazine, mesoridazine, prochlorperazine, thioridazine This list may not describe all possible interactions. Give your health care provider a list of all the medicines, herbs, non-prescription drugs, or dietary supplements you use. Also tell them if you smoke, drink alcohol, or use illegal drugs. Some items may interact with your medicine. What should I watch for while using this medicine? Tell your doctor or health care professional if you feel your medicine is no longer working. Keep this medicine with you at all times. Sit or lie down when you take your medicine to prevent falling if you feel dizzy or faint after using it. Try to remain calm. This will help you to feel better faster. If you feel dizzy, take several deep breaths and lie down with your feet propped up, or bend forward with your head resting between your knees. You may get drowsy or dizzy. Do not drive, use machinery, or do anything that needs mental alertness until you know how this drug affects you. Do not  stand or sit up quickly, especially if you are an older patient. This reduces the risk of dizzy or fainting spells. Alcohol can make you more drowsy and dizzy. Avoid alcoholic drinks. Do not treat yourself for coughs, colds, or pain while you are taking this medicine without asking your doctor or health care professional for advice. Some ingredients may increase your blood pressure. What side effects may I notice from receiving this medicine? Side effects that you should report  to your doctor or health care professional as soon as possible:  blurred vision  dry mouth  skin rash  sweating  the feeling of extreme pressure in the head  unusually weak or tired Side effects that usually do not require medical attention (report to your doctor or health care professional if they continue or are bothersome):  flushing of the face or neck  headache  irregular heartbeat, palpitations  nausea, vomiting This list may not describe all possible side effects. Call your doctor for medical advice about side effects. You may report side effects to FDA at 1-800-FDA-1088. Where should I keep my medicine? Keep out of the reach of children. Store at room temperature between 20 and 25 degrees C (68 and 77 degrees F). Store in Chief of Staff. Protect from light and moisture. Keep tightly closed. Throw away any unused medicine after the expiration date. NOTE: This sheet is a summary. It may not cover all possible information. If you have questions about this medicine, talk to your doctor, pharmacist, or health care provider.  2020 Elsevier/Gold Standard (2013-02-26 17:57:36)     Signed, Ermalinda Barrios, PA-C  06/08/2019 1:05 PM    [facility] [address], [place], [place]  [address] Phone: [phone]; Fax: [phone]

## 2019-06-08 NOTE — Patient Instructions (Signed)
Medication Instructions:  Use nitroglycerin as directed for chaet pain *If you need a refill on your cardiac medications before your next appointment, please call your pharmacy*  Lab Work: Cbc,cmrt,lipids If you have labs (blood work) drawn today and your tests are completely normal, you will receive your results only by: Marland Kitchen MyChart Message (if you have MyChart) OR . A paper copy in the mail If you have any lab test that is abnormal or we need to change your treatment, we will call you to review the results.  Testing/Procedures: NONE  Follow-Up: At [facility], you and your health needs are our priority.  As part of our continuing mission to provide you with exceptional heart care, we have created designated Provider Care Teams.  These Care Teams include your primary Cardiologist (physician) and Advanced Practice Providers (APPs -  Physician Assistants and Nurse Practitioners) who all work together to provide you with the care you need, when you need it.  Your next appointment:   6-8 week(s)  The format for your next appointment:   In Person  Provider:   Rozann Lesches, MD  Other Instructions None    Thank you for choosing [place] !        Nitroglycerin sublingual tablets What is this medicine? NITROGLYCERIN (nye troe GLI ser in) is a type of vasodilator. It relaxes blood vessels, increasing the blood and oxygen supply to your heart. This medicine is used to relieve chest pain caused by angina. It is also used to prevent chest pain before activities like climbing stairs, going outdoors in cold weather, or sexual activity. This medicine may be used for other purposes; ask your health care provider or pharmacist if you have questions. COMMON BRAND NAME(S): Nitroquick, Nitrostat, Nitrotab What should I tell my health care provider before I take this medicine? They need to know if you have any of these conditions:  anemia  head injury,  recent stroke, or bleeding in the brain  liver disease  previous heart attack  an unusual or allergic reaction to nitroglycerin, other medicines, foods, dyes, or preservatives  pregnant or trying to get pregnant  breast-feeding How should I use this medicine? Take this medicine by mouth as needed. At the first sign of an angina attack (chest pain or tightness) place one tablet under your tongue. You can also take this medicine 5 to 10 minutes before an event likely to produce chest pain. Follow the directions on the prescription label. Let the tablet dissolve under the tongue. Do not swallow whole. Replace the dose if you accidentally swallow it. It will help if your mouth is not dry. Saliva around the tablet will help it to dissolve more quickly. Do not eat or drink, smoke or chew tobacco while a tablet is dissolving. If you are not better within 5 minutes after taking ONE dose of nitroglycerin, call 9-1-1 immediately to seek emergency medical care. Do not take more than 3 nitroglycerin tablets over 15 minutes. If you take this medicine often to relieve symptoms of angina, your doctor or health care professional may provide you with different instructions to manage your symptoms. If symptoms do not go away after following these instructions, it is important to call 9-1-1 immediately. Do not take more than 3 nitroglycerin tablets over 15 minutes. Talk to your pediatrician regarding the use of this medicine in children. Special care may be needed. Overdosage: If you think you have taken too much of this medicine contact a poison control  center or emergency room at once. NOTE: This medicine is only for you. Do not share this medicine with others. What if I miss a dose? This does not apply. This medicine is only used as needed. What may interact with this medicine? Do not take this medicine with any of the following medications:  certain migraine medicines like ergotamine and dihydroergotamine  (DHE)  medicines used to treat erectile dysfunction like sildenafil, tadalafil, and vardenafil  riociguat This medicine may also interact with the following medications:  alteplase  aspirin  heparin  medicines for high blood pressure  medicines for mental depression  other medicines used to treat angina  phenothiazines like chlorpromazine, mesoridazine, prochlorperazine, thioridazine This list may not describe all possible interactions. Give your health care provider a list of all the medicines, herbs, non-prescription drugs, or dietary supplements you use. Also tell them if you smoke, drink alcohol, or use illegal drugs. Some items may interact with your medicine. What should I watch for while using this medicine? Tell your doctor or health care professional if you feel your medicine is no longer working. Keep this medicine with you at all times. Sit or lie down when you take your medicine to prevent falling if you feel dizzy or faint after using it. Try to remain calm. This will help you to feel better faster. If you feel dizzy, take several deep breaths and lie down with your feet propped up, or bend forward with your head resting between your knees. You may get drowsy or dizzy. Do not drive, use machinery, or do anything that needs mental alertness until you know how this drug affects you. Do not stand or sit up quickly, especially if you are an older patient. This reduces the risk of dizzy or fainting spells. Alcohol can make you more drowsy and dizzy. Avoid alcoholic drinks. Do not treat yourself for coughs, colds, or pain while you are taking this medicine without asking your doctor or health care professional for advice. Some ingredients may increase your blood pressure. What side effects may I notice from receiving this medicine? Side effects that you should report to your doctor or health care professional as soon as possible:  blurred vision  dry mouth  skin  rash  sweating  the feeling of extreme pressure in the head  unusually weak or tired Side effects that usually do not require medical attention (report to your doctor or health care professional if they continue or are bothersome):  flushing of the face or neck  headache  irregular heartbeat, palpitations  nausea, vomiting This list may not describe all possible side effects. Call your doctor for medical advice about side effects. You may report side effects to FDA at 1-800-FDA-1088. Where should I keep my medicine? Keep out of the reach of children. Store at room temperature between 20 and 25 degrees C (68 and 77 degrees F). Store in [employer] of Staff. Protect from light and moisture. Keep tightly closed. Throw away any unused medicine after the expiration date. NOTE: This sheet is a summary. It may not cover all possible information. If you have questions about this medicine, talk to your doctor, pharmacist, or health care provider.  2020 Elsevier/Gold Standard (2013-02-26 17:57:36)

## 2019-06-09 DIAGNOSIS — E785 Hyperlipidemia, unspecified: Secondary | ICD-10-CM | POA: Diagnosis not present

## 2019-06-09 DIAGNOSIS — I25810 Atherosclerosis of coronary artery bypass graft(s) without angina pectoris: Secondary | ICD-10-CM | POA: Diagnosis not present

## 2019-06-09 LAB — BASIC METABOLIC PANEL
BUN/Creatinine Ratio: 29 (calc) — ABNORMAL HIGH (ref 6–22)
BUN: 37 mg/dL — ABNORMAL HIGH (ref 7–25)
CO2: 28 mmol/L (ref 20–32)
Calcium: 9.6 mg/dL (ref 8.6–10.3)
Chloride: 105 mmol/L (ref 98–110)
Creat: 1.28 mg/dL — ABNORMAL HIGH (ref 0.70–1.11)
Glucose, Bld: 103 mg/dL — ABNORMAL HIGH (ref 65–99)
Potassium: 4.8 mmol/L (ref 3.5–5.3)
Sodium: 140 mmol/L (ref 135–146)

## 2019-06-09 LAB — CBC
HCT: 36.7 % — ABNORMAL LOW (ref 38.5–50.0)
Hemoglobin: 11.9 g/dL — ABNORMAL LOW (ref 13.2–17.1)
MCH: 30.9 pg (ref 27.0–33.0)
MCHC: 32.4 g/dL (ref 32.0–36.0)
MCV: 95.3 fL (ref 80.0–100.0)
MPV: 11.2 fL (ref 7.5–12.5)
Platelets: 98 10*3/uL — ABNORMAL LOW (ref 140–400)
RBC: 3.85 10*6/uL — ABNORMAL LOW (ref 4.20–5.80)
RDW: 13 % (ref 11.0–15.0)
WBC: 3.9 10*3/uL (ref 3.8–10.8)

## 2019-06-09 LAB — LIPID PANEL
Cholesterol: 132 mg/dL (ref <200)
HDL: 35 mg/dL — ABNORMAL LOW (ref >=40)
LDL Cholesterol (Calc): 78 mg/dL (calc)
Non-HDL Cholesterol (Calc): 97 mg/dL (calc) (ref <130)
Total CHOL/HDL Ratio: 3.8 (calc) (ref <5.0)
Triglycerides: 111 mg/dL (ref <150)

## 2019-06-17 ENCOUNTER — Inpatient Hospital Stay (HOSPITAL_COMMUNITY): Payer: Medicare Other | Attending: Hematology

## 2019-06-17 ENCOUNTER — Other Ambulatory Visit: Payer: Self-pay

## 2019-06-17 DIAGNOSIS — D4620 Refractory anemia with excess of blasts, unspecified: Secondary | ICD-10-CM | POA: Insufficient documentation

## 2019-06-17 DIAGNOSIS — N189 Chronic kidney disease, unspecified: Secondary | ICD-10-CM | POA: Insufficient documentation

## 2019-06-17 DIAGNOSIS — E538 Deficiency of other specified B group vitamins: Secondary | ICD-10-CM | POA: Insufficient documentation

## 2019-06-17 LAB — CBC WITH DIFFERENTIAL/PLATELET
Abs Immature Granulocytes: 0.01 10*3/uL (ref 0.00–0.07)
Basophils Absolute: 0 10*3/uL (ref 0.0–0.1)
Basophils Relative: 0 %
Eosinophils Absolute: 0 10*3/uL (ref 0.0–0.5)
Eosinophils Relative: 0 %
HCT: 35.6 % — ABNORMAL LOW (ref 39.0–52.0)
Hemoglobin: 11.3 g/dL — ABNORMAL LOW (ref 13.0–17.0)
Immature Granulocytes: 0 %
Lymphocytes Relative: 26 %
Lymphs Abs: 0.9 10*3/uL (ref 0.7–4.0)
MCH: 31.3 pg (ref 26.0–34.0)
MCHC: 31.7 g/dL (ref 30.0–36.0)
MCV: 98.6 fL (ref 80.0–100.0)
Monocytes Absolute: 1 10*3/uL (ref 0.1–1.0)
Monocytes Relative: 29 %
Neutro Abs: 1.6 10*3/uL — ABNORMAL LOW (ref 1.7–7.7)
Neutrophils Relative %: 45 %
Platelets: 90 10*3/uL — ABNORMAL LOW (ref 150–400)
RBC: 3.61 MIL/uL — ABNORMAL LOW (ref 4.22–5.81)
RDW: 13.1 % (ref 11.5–15.5)
WBC: 3.5 10*3/uL — ABNORMAL LOW (ref 4.0–10.5)
nRBC: 0 % (ref 0.0–0.2)

## 2019-06-17 LAB — IRON AND TIBC
Iron: 70 ug/dL (ref 45–182)
Saturation Ratios: 22 % (ref 17.9–39.5)
TIBC: 314 ug/dL (ref 250–450)
UIBC: 244 ug/dL

## 2019-06-17 LAB — COMPREHENSIVE METABOLIC PANEL
ALT: 11 U/L (ref 0–44)
AST: 13 U/L — ABNORMAL LOW (ref 15–41)
Albumin: 4 g/dL (ref 3.5–5.0)
Alkaline Phosphatase: 54 U/L (ref 38–126)
Anion gap: 5 (ref 5–15)
BUN: 38 mg/dL — ABNORMAL HIGH (ref 8–23)
CO2: 30 mmol/L (ref 22–32)
Calcium: 9.1 mg/dL (ref 8.9–10.3)
Chloride: 101 mmol/L (ref 98–111)
Creatinine, Ser: 1.22 mg/dL (ref 0.61–1.24)
GFR calc Af Amer: >60 mL/min (ref >60)
GFR calc non Af Amer: 52 mL/min — ABNORMAL LOW (ref >60)
Glucose, Bld: 185 mg/dL — ABNORMAL HIGH (ref 70–99)
Potassium: 4.8 mmol/L (ref 3.5–5.1)
Sodium: 136 mmol/L (ref 135–145)
Total Bilirubin: 0.7 mg/dL (ref 0.3–1.2)
Total Protein: 6.4 g/dL — ABNORMAL LOW (ref 6.5–8.1)

## 2019-06-17 LAB — FOLATE: Folate: 14.8 ng/mL (ref >5.9)

## 2019-06-17 LAB — FERRITIN: Ferritin: 85 ng/mL (ref 24–336)

## 2019-06-17 LAB — VITAMIN B12: Vitamin B-12: 663 pg/mL (ref 180–914)

## 2019-06-18 ENCOUNTER — Other Ambulatory Visit (HOSPITAL_COMMUNITY): Payer: Medicare Other

## 2019-06-25 ENCOUNTER — Encounter (HOSPITAL_COMMUNITY): Payer: Self-pay | Admitting: Hematology

## 2019-06-25 ENCOUNTER — Inpatient Hospital Stay (HOSPITAL_BASED_OUTPATIENT_CLINIC_OR_DEPARTMENT_OTHER): Payer: Medicare Other | Admitting: Hematology

## 2019-06-25 ENCOUNTER — Other Ambulatory Visit: Payer: Self-pay

## 2019-06-25 VITALS — BP 118/58 | HR 62 | Temp 97.1°F | Resp 18 | Wt 173.0 lb

## 2019-06-25 DIAGNOSIS — N189 Chronic kidney disease, unspecified: Secondary | ICD-10-CM | POA: Diagnosis not present

## 2019-06-25 DIAGNOSIS — E538 Deficiency of other specified B group vitamins: Secondary | ICD-10-CM | POA: Diagnosis not present

## 2019-06-25 DIAGNOSIS — D4620 Refractory anemia with excess of blasts, unspecified: Secondary | ICD-10-CM | POA: Diagnosis not present

## 2019-06-25 NOTE — Patient Instructions (Signed)
[facility] at [facility] Discharge Instructions  You were seen today by Dr. Katragadda. He went over your recent lab results. He will see you back in 6 months for labs and follow up.   Thank you for choosing [facility] at [facility] to provide your oncology and hematology care.  To afford each patient quality time with our provider, please arrive at least 15 minutes before your scheduled appointment time.   If you have a lab appointment with the Cancer Center please come in thru the  Main Entrance and check in at the main information desk  You need to re-schedule your appointment should you arrive 10 or more minutes late.  We strive to give you quality time with our providers, and arriving late affects you and other patients whose appointments are after yours.  Also, if you no show three or more times for appointments you may be dismissed from the clinic at the providers discretion.     Again, thank you for choosing [facility].  Our hope is that these requests will decrease the amount of time that you wait before being seen by our physicians.       _____________________________________________________________  Should you have questions after your visit to [facility], please contact our office at [phone] between the hours of 8:00 a.m. and 4:30 p.m.  Voicemails left after 4:00 p.m. will not be returned until the following business day.  For prescription refill requests, have your pharmacy contact our office and allow 72 hours.    Cancer Center Support Programs:   > Cancer Support Group  2nd Tuesday of the month 1pm-2pm, Journey Room    

## 2019-06-26 NOTE — Assessment & Plan Note (Signed)
1.  Low-grade MDS: -Bone marrow biopsy on 10/27/2015 showed hypercellular marrow with mild dyserythropoiesis and dismegakaryocytes.  Chromosome analysis was normal.  FISH panel was normal.  Flow cytometry on peripheral blood did not show any monoclonal B-cell population. -He has normocytic anemia which is stable between 11 and 12. -We reviewed blood work.  White count is 3.5 with ANC of 1600.  Hemoglobin is 11.3. -He also has moderate thrombocytopenia which is stable between 80 and 90 K. -He denies any recurrent infections.  No recent hospitalizations. -We will see him back in 6 months for follow-up with repeat blood counts.  2.  B12 deficiency: -His B12 level is normal.  He is taking B12 injections every 4 weeks which she will continue.  3.  CKD: -Creatinine has been stable between 1.2 and 1.3.

## 2019-06-26 NOTE — Progress Notes (Signed)
[name], [name]   CLINIC:  Medical Oncology/Hematology  PCP:  Maryruth Hancock, [place] [address] [phone]   REASON FOR VISIT: Follow-up for low grade myelodysplastic syndrome (MDS) and vitamin B12 deficiency   CURRENT THERAPY: Observation and monthly B12 injections  INTERVAL HISTORY:  [name] 86 y.o. male seen for follow-up of low-grade MDS and B12 deficiency.  Denies any fevers, night sweats or weight loss in the last 6 months.  Denies any recent hospitalizations.  No antibiotic use.  Continuing to take B12 injections monthly.  Appetite and energy levels are 100%.  REVIEW OF SYSTEMS:  Review of Systems  All other systems reviewed and are negative.    PAST MEDICAL/SURGICAL HISTORY:  Past Medical History:  Diagnosis Date  . BPH (benign prostatic hypertrophy)   . Chronic systolic heart failure ([place])   . Coronary artery disease    Multivessel status post CABG 8/06, LIMA-LAD, SVG-CFX, SVG-distal RCA  . Essential hypertension   . Hyperlipidemia   . ICD (implantable cardiac defibrillator) in place    Medtronic  . Ischemic cardiomyopathy   . Myelodysplasia, low grade ([place]) 11/27/2015  . Nephrolithiasis   . Prostate cancer ([place])   . Vitamin B 12 deficiency 07/27/2016   Past Surgical History:  Procedure Laterality Date  . CARDIAC DEFIBRILLATOR PLACEMENT    . COLONOSCOPY    . COLONOSCOPY N/A 08/19/2013   Procedure: COLONOSCOPY;  Surgeon: Rogene Houston, MD;  Location: [facility];  Service: Endoscopy;  Laterality: N/A;  930  . CORONARY ARTERY BYPASS GRAFT     8/06 with left anterior descending artery, saphenous vein graft to circumflex, saphenous vein graft to distal right coronary artery  . HERNIA REPAIR    . POLYPECTOMY    . PROSTATE SURGERY       SOCIAL HISTORY:  Social History   Socioeconomic History  . Marital status: Married    Spouse name: Not on file  . Number of children: Not on  file  . Years of education: Not on file  . Highest education level: Not on file  Occupational History  . Occupation: Retired    Fish farm manager: RETIRED  Tobacco Use  . Smoking status: Former Smoker    Packs/day: 0.25    Years: 44.00    Pack years: 11.00    Types: Cigarettes    Quit date: 05/14/1992    Years since quitting: 27.1  . Smokeless tobacco: Never Used  Substance and Sexual Activity  . Alcohol use: No  . Drug use: No  . Sexual activity: Not on file    Comment: married 63 years  Other Topics Concern  . Not on file  Social History Narrative  . Not on file   Social Determinants of Health   Financial Resource Strain:   . Difficulty of Paying Living Expenses: Not on file  Food Insecurity:   . Worried About Charity fundraiser in the Last Year: Not on file  . Ran Out of Food in the Last Year: Not on file  Transportation Needs:   . Lack of Transportation (Medical): Not on file  . Lack of Transportation (Non-Medical): Not on file  Physical Activity:   . Days of Exercise per Week: Not on file  . Minutes of Exercise per Session: Not on file  Stress:   . Feeling of Stress : Not on file  Social Connections:   . Frequency of Communication with  Friends and Family: Not on file  . Frequency of Social Gatherings with Friends and Family: Not on file  . Attends Religious Services: Not on file  . Active Member of Clubs or Organizations: Not on file  . Attends Archivist Meetings: Not on file  . Marital Status: Not on file  Intimate Partner Violence:   . Fear of Current or Ex-Partner: Not on file  . Emotionally Abused: Not on file  . Physically Abused: Not on file  . Sexually Abused: Not on file    FAMILY HISTORY:  Family History  Problem Relation Age of Onset  . Colon cancer Father   . Coronary artery disease Other     CURRENT MEDICATIONS:  Outpatient Encounter Medications as of 06/25/2019  Medication Sig  . allopurinol (ZYLOPRIM) 300 MG tablet Take 300 mg by  mouth daily.   Marland Kitchen aspirin 81 MG chewable tablet Chew 81 mg by mouth daily.  . carvedilol (COREG) 12.5 MG tablet Take 12.5 mg by mouth 2 (two) times daily with a meal.  . Cholecalciferol (VITAMIN D3) 1000 UNITS CAPS Take 1 capsule by mouth daily.   . cyanocobalamin (,VITAMIN B-12,) 1000 MCG/ML injection ADMINISTER 1 ML IN THE MUSCLE 1 TIME AS DIRECTED  . hydrochlorothiazide (HYDRODIURIL) 25 MG tablet TAKE 1/2 TABLET(12.5 MG) BY MOUTH DAILY  . losartan (COZAAR) 50 MG tablet Take 1.5 tablets (75 mg total) by mouth daily.  . simvastatin (ZOCOR) 40 MG tablet TAKE ONE TABLET BY MOUTH EVERY EVENING  . spironolactone (ALDACTONE) 25 MG tablet TAKE 1/2 TABLET(12.5 MG) BY MOUTH DAILY  . vitamin C (ASCORBIC ACID) 500 MG tablet Take 500 mg by mouth daily.    Marland Kitchen acetaminophen (TYLENOL) 500 MG tablet Take 1 tablet (500 mg total) by mouth every 6 (six) hours as needed for mild pain or fever. (Patient not taking: Reported on 06/25/2019)  . cetirizine (ZYRTEC) 10 MG tablet Take 10 mg by mouth daily as needed for allergies.   . diphenhydrAMINE (BENADRYL) 25 MG tablet Take 25 mg by mouth at bedtime as needed.  . nitroGLYCERIN (NITROSTAT) 0.4 MG SL tablet Place 1 tablet (0.4 mg total) under the tongue every 5 (five) minutes as needed. (Patient not taking: Reported on 06/25/2019)   No facility-administered encounter medications on file as of 06/25/2019.    ALLERGIES:  Allergies  Allergen Reactions  . Codeine Nausea And Vomiting     PHYSICAL EXAM:  ECOG Performance status: 1  Vitals:   06/25/19 1127  BP: (!) 118/58  Pulse: 62  Resp: 18  Temp: (!) 97.1 F (36.2 C)  SpO2: 98%   Filed Weights   06/25/19 1127  Weight: 173 lb (78.5 kg)    Physical Exam Vitals reviewed.  Constitutional:      Appearance: Normal appearance. He is normal weight.  Cardiovascular:     Rate and Rhythm: Normal rate and regular rhythm.     Heart sounds: Normal heart sounds.  Pulmonary:     Effort: Pulmonary effort is normal.      Breath sounds: Normal breath sounds.  Abdominal:     General: There is no distension.     Palpations: Abdomen is soft. There is no mass.  Musculoskeletal:        General: Normal range of motion.  Skin:    General: Skin is warm and dry.  Neurological:     General: No focal deficit present.     Mental Status: He is alert and oriented to person, place, and time.  Mental status is at baseline.  Psychiatric:        Mood and Affect: Mood normal.        Behavior: Behavior normal.     LABORATORY DATA:  I have reviewed the labs as listed.  CBC    Component Value Date/Time   WBC 3.5 (L) 06/17/2019 1318   RBC 3.61 (L) 06/17/2019 1318   HGB 11.3 (L) 06/17/2019 1318   HCT 35.6 (L) 06/17/2019 1318   PLT 90 (L) 06/17/2019 1318   MCV 98.6 06/17/2019 1318   MCH 31.3 06/17/2019 1318   MCHC 31.7 06/17/2019 1318   RDW 13.1 06/17/2019 1318   LYMPHSABS 0.9 06/17/2019 1318   MONOABS 1.0 06/17/2019 1318   EOSABS 0.0 06/17/2019 1318   BASOSABS 0.0 06/17/2019 1318   CMP Latest Ref Rng & Units 06/17/2019 06/09/2019 12/04/2018  Glucose 70 - 99 mg/dL 185(H) 103(H) 154(H)  BUN 8 - 23 mg/dL 38(H) 37(H) 33(H)  Creatinine 0.61 - 1.24 mg/dL 1.22 1.28(H) 1.24  Sodium 135 - 145 mmol/L 136 140 138  Potassium 3.5 - 5.1 mmol/L 4.8 4.8 5.0  Chloride 98 - 111 mmol/L 101 105 105  CO2 22 - 32 mmol/L '30 28 25  ' Calcium 8.9 - 10.3 mg/dL 9.1 9.6 9.4  Total Protein 6.5 - 8.1 g/dL 6.4(L) - 6.8  Total Bilirubin 0.3 - 1.2 mg/dL 0.7 - 0.8  Alkaline Phos 38 - 126 U/L 54 - 94  AST 15 - 41 U/L 13(L) - 13(L)  ALT 0 - 44 U/L 11 - 9       DIAGNOSTIC IMAGING:  I have independently reviewed the scans and discussed with the patient.     ASSESSMENT & PLAN:   Myelodysplasia, low grade ([place]) 1.  Low-grade MDS: -Bone marrow biopsy on 10/27/2015 showed hypercellular marrow with mild dyserythropoiesis and dismegakaryocytes.  Chromosome analysis was normal.  FISH panel was normal.  Flow cytometry on peripheral blood did  not show any monoclonal B-cell population. -He has normocytic anemia which is stable between 11 and 12. -We reviewed blood work.  White count is 3.5 with ANC of 1600.  Hemoglobin is 11.3. -He also has moderate thrombocytopenia which is stable between 80 and 90 K. -He denies any recurrent infections.  No recent hospitalizations. -We will see him back in 6 months for follow-up with repeat blood counts.  2.  B12 deficiency: -His B12 level is normal.  He is taking B12 injections every 4 weeks which she will continue.  3.  CKD: -Creatinine has been stable between 1.2 and 1.3.   Orders placed this encounter:  Orders Placed This Encounter  Procedures  . CBC with Differential/Platelet  . Vitamin B12  . Lactate dehydrogenase      Derek Jack, MD [facility] [phone]

## 2019-07-20 ENCOUNTER — Encounter: Payer: Self-pay | Admitting: Cardiology

## 2019-07-20 ENCOUNTER — Ambulatory Visit (INDEPENDENT_AMBULATORY_CARE_PROVIDER_SITE_OTHER): Payer: Medicare Other | Admitting: Cardiology

## 2019-07-20 VITALS — BP 114/62 | HR 64 | Temp 98.5°F | Ht 64.0 in | Wt 172.0 lb

## 2019-07-20 DIAGNOSIS — R55 Syncope and collapse: Secondary | ICD-10-CM

## 2019-07-20 DIAGNOSIS — I25119 Atherosclerotic heart disease of native coronary artery with unspecified angina pectoris: Secondary | ICD-10-CM | POA: Diagnosis not present

## 2019-07-20 DIAGNOSIS — I255 Ischemic cardiomyopathy: Secondary | ICD-10-CM | POA: Diagnosis not present

## 2019-07-20 MED ORDER — CARVEDILOL 6.25 MG PO TABS: 6.2500 mg | ORAL_TABLET | Freq: Two times a day (BID) | ORAL | 3 refills | Status: DC

## 2019-07-20 MED ORDER — LOSARTAN POTASSIUM 50 MG PO TABS: 50.0000 mg | ORAL_TABLET | Freq: Every day | ORAL | 3 refills | Status: DC

## 2019-07-20 NOTE — Progress Notes (Signed)
Cardiology Office Note  Date: 07/20/2019   ID: [name], DOB [id number], MRN [id number]  PCP:  Maryruth Hancock, MD  Cardiologist:  Rozann Lesches, MD Electrophysiologist:  None   Chief Complaint  Patient presents with  . Cardiac follow-up    History of Present Illness: [name] is a 86 y.o. male last seen in January by Ms. Bonnell Public PA-C.  I reviewed his records.  He states that he had another spell on February 15, again had gone into his bathroom and brushed his teeth, came back and sat down when he began to feel lightheaded and diaphoretic.  His wife checked his blood pressure and heart rate.  Systolic dropped into the 80s and his heart rate remained steady in the 60s.  He had mild angina at that time and took a total of 2 supple nitroglycerin tablets with resolution of symptoms ultimately and stabilization of blood pressure.  He did not have to go to the hospital.  Since then he has been stable without any recurrent symptoms.  We went over his medications today and I discussed discontinuation of HCTZ and reduction in Cozaar dose for now.  Otherwise no changes.  Past Medical History:  Diagnosis Date  . BPH (benign prostatic hypertrophy)   . Chronic systolic heart failure ([place])   . Coronary artery disease    Multivessel status post CABG 8/06, LIMA-LAD, SVG-CFX, SVG-distal RCA  . Essential hypertension   . Hyperlipidemia   . ICD (implantable cardiac defibrillator) in place    Medtronic  . Ischemic cardiomyopathy   . Myelodysplasia, low grade ([place]) 11/27/2015  . Nephrolithiasis   . Prostate cancer ([employer])   . Vitamin B 12 deficiency 07/27/2016    Past Surgical History:  Procedure Laterality Date  . CARDIAC DEFIBRILLATOR PLACEMENT    . COLONOSCOPY    . COLONOSCOPY N/A 08/19/2013   Procedure: COLONOSCOPY;  Surgeon: Rogene Houston, MD;  Location[facility];  Service: Endoscopy;  Laterality: N/A;  930  . CORONARY ARTERY BYPASS GRAFT     8/06 with left anterior  descending artery, saphenous vein graft to circumflex, saphenous vein graft to distal right coronary artery  . HERNIA REPAIR    . POLYPECTOMY    . PROSTATE SURGERY      Current Outpatient Medications  Medication Sig Dispense Refill  . acetaminophen (TYLENOL) 500 MG tablet Take 1 tablet (500 mg total) by mouth every 6 (six) hours as needed for mild pain or fever. 30 tablet 0  . allopurinol (ZYLOPRIM) 300 MG tablet Take 300 mg by mouth daily.     Marland Kitchen aspirin 81 MG chewable tablet Chew 81 mg by mouth daily.    . cetirizine (ZYRTEC) 10 MG tablet Take 10 mg by mouth daily as needed for allergies.     . Cholecalciferol (VITAMIN D3) 1000 UNITS CAPS Take 1 capsule by mouth daily.     . cyanocobalamin (,VITAMIN B-12,) 1000 MCG/ML injection ADMINISTER 1 ML IN THE MUSCLE 1 TIME AS DIRECTED 1 mL 14  . diphenhydrAMINE (BENADRYL) 25 MG tablet Take 25 mg by mouth at bedtime as needed.    Marland Kitchen losartan (COZAAR) 50 MG tablet Take 1 tablet (50 mg total) by mouth daily. 90 tablet 3  . nitroGLYCERIN (NITROSTAT) 0.4 MG SL tablet Place 1 tablet (0.4 mg total) under the tongue every 5 (five) minutes as needed. 25 tablet 3  . simvastatin (ZOCOR) 40 MG tablet TAKE ONE TABLET BY MOUTH EVERY EVENING 30 tablet 0  .  spironolactone (ALDACTONE) 25 MG tablet TAKE 1/2 TABLET(12.5 MG) BY MOUTH DAILY 45 tablet 3  . vitamin C (ASCORBIC ACID) 500 MG tablet Take 500 mg by mouth daily.      . carvedilol (COREG) 6.25 MG tablet Take 1 tablet (6.25 mg total) by mouth 2 (two) times daily. 180 tablet 3   No current facility-administered medications for this visit.   Allergies:  Codeine   ROS:  No palpitations or sudden syncope.   Physical Exam: VS:  BP 114/62   Pulse 64   Temp 98.5 F (36.9 C)   Ht _0  (1.626 m)   Wt 172 lb (78 kg)   SpO2 96%   BMI 29.52 kg/m , BMI Body mass index is 29.52 kg/m.  Wt Readings from Last 3 Encounters:  07/20/19 172 lb (78 kg)  06/25/19 173 lb (78.5 kg)  06/08/19 171 lb (77.6 kg)      General: Elderly male, appears comfortable at rest. HEENT: Conjunctiva and lids normal, wearing a mask. Neck: Supple, no elevated JVP or carotid bruits, no thyromegaly. Lungs: Clear to auscultation, nonlabored breathing at rest. Cardiac: Regular rate and rhythm, no S3, soft systolic murmur. Abdomen: Soft, nontender, bowel sounds present. Extremities: No pitting edema, distal pulses 2+.  ECG:  An ECG dated 06/08/2019 was personally reviewed today and demonstrated:  Sinus bradycardia with right bundle branch block and left anterior fascicular block.  Recent Labwork: 06/17/2019: ALT 11; AST 13; BUN 38; Creatinine, Ser 1.22; Hemoglobin 11.3; Platelets 90; Potassium 4.8; Sodium 136     Component Value Date/Time   CHOL 132 06/09/2019 0808   TRIG 111 06/09/2019 0808   HDL 35 (L) 06/09/2019 0808   CHOLHDL 3.8 06/09/2019 0808   VLDL 25.0 09/09/2014 1123   LDLCALC 78 06/09/2019 0808    Other Studies Reviewed Today:  No interval cardiac testing.  Assessment and Plan:  1.  Recent spells as outlined above.  Symptoms somewhat consistent with neurocardiogenic etiology although still could be arrhythmogenic, he does have conduction system disease by ECG and has been on beta-blocker.  His wife checked vital signs with most recent encounter and blood pressure was low although heart rate steady in the 60s which is his baseline (on carvedilol).  For now we will stop HCTZ and reduce Cozaar to 50 mg daily.  Also cut Coreg to 6.25 mg twice daily.  Continue observation.  Can consider follow-up monitoring if symptoms persist.  2.  Multivessel CAD status post CABG in 2006.  Continue medical therapy with adjustments as noted above.  He remains on aspirin, Coreg, losartan, Zocor, and Aldactone.  Recent refill provided for fresh bottle of nitroglycerin.  3.  Ischemic cardiomyopathy, LVEF 45 to 50% range by most recent assessment.  Has Medtronic ICD but device met ERI and generator was not changed per discussions  with Dr. Lovena Le.  Medication Adjustments/Labs and Tests Ordered: Current medicines are reviewed at length with the patient today.  Concerns regarding medicines are outlined above.   Tests Ordered: No orders of the defined types were placed in this encounter.   Medication Changes: Meds ordered this encounter  Medications  . losartan (COZAAR) 50 MG tablet    Sig: Take 1 tablet (50 mg total) by mouth daily.    Dispense:  90 tablet    Refill:  3  . carvedilol (COREG) 6.25 MG tablet    Sig: Take 1 tablet (6.25 mg total) by mouth 2 (two) times daily.    Dispense:  180 tablet  Refill:  3    Disposition:  Follow up 6 months in the [place] office.  Signed, Satira Sark, MD, [facility] 07/20/2019 2:07 PM    [place] [facility] [address] [place], [place] [address] Phone: [phone]; Fax: [phone]

## 2019-07-20 NOTE — Patient Instructions (Addendum)
Medication Instructions:  STOP HYDROCHLOROTHIAZIDE   DECREASE COZAAR TO 50 MG DAILY   DECREASE COREG TO 6.25 MG - TWO TIMES DAILY   Labwork: NONE  Testing/Procedures: NONE  Follow-Up: Your physician wants you to follow-up in: 6 MONTHS.  You will receive a reminder letter in the mail two months in advance. If you don't receive a letter, please call our office to schedule the follow-up appointment.  Any Other Special Instructions Will Be Listed Below (If Applicable).     If you need a refill on your cardiac medications before your next appointment, please call your pharmacy.

## 2019-07-29 ENCOUNTER — Other Ambulatory Visit: Payer: Self-pay

## 2019-07-29 MED ORDER — SPIRONOLACTONE 25 MG PO TABS: ORAL_TABLET | ORAL | 3 refills | Status: DC

## 2019-07-29 NOTE — Telephone Encounter (Signed)
Refilled aldactone 12.5 mg qd #45, RF:3

## 2019-09-18 ENCOUNTER — Other Ambulatory Visit: Payer: Self-pay | Admitting: Cardiology

## 2019-10-13 DIAGNOSIS — E785 Hyperlipidemia, unspecified: Secondary | ICD-10-CM | POA: Diagnosis not present

## 2019-10-13 DIAGNOSIS — J302 Other seasonal allergic rhinitis: Secondary | ICD-10-CM | POA: Diagnosis not present

## 2019-10-13 DIAGNOSIS — M1A0720 Idiopathic chronic gout, left ankle and foot, without tophus (tophi): Secondary | ICD-10-CM | POA: Diagnosis not present

## 2019-10-13 DIAGNOSIS — Z0189 Encounter for other specified special examinations: Secondary | ICD-10-CM | POA: Diagnosis not present

## 2019-10-13 DIAGNOSIS — Z951 Presence of aortocoronary bypass graft: Secondary | ICD-10-CM | POA: Diagnosis not present

## 2019-10-13 DIAGNOSIS — D696 Thrombocytopenia, unspecified: Secondary | ICD-10-CM | POA: Diagnosis not present

## 2019-12-01 ENCOUNTER — Ambulatory Visit: Payer: Medicare Other | Admitting: Family Medicine

## 2019-12-16 ENCOUNTER — Other Ambulatory Visit: Payer: Self-pay

## 2019-12-16 ENCOUNTER — Inpatient Hospital Stay (HOSPITAL_COMMUNITY): Payer: Medicare Other | Attending: Hematology

## 2019-12-16 DIAGNOSIS — E538 Deficiency of other specified B group vitamins: Secondary | ICD-10-CM | POA: Insufficient documentation

## 2019-12-16 DIAGNOSIS — Z8546 Personal history of malignant neoplasm of prostate: Secondary | ICD-10-CM | POA: Diagnosis not present

## 2019-12-16 DIAGNOSIS — D4620 Refractory anemia with excess of blasts, unspecified: Secondary | ICD-10-CM

## 2019-12-16 DIAGNOSIS — N189 Chronic kidney disease, unspecified: Secondary | ICD-10-CM | POA: Insufficient documentation

## 2019-12-16 DIAGNOSIS — D469 Myelodysplastic syndrome, unspecified: Secondary | ICD-10-CM | POA: Diagnosis not present

## 2019-12-16 DIAGNOSIS — H6122 Impacted cerumen, left ear: Secondary | ICD-10-CM | POA: Diagnosis not present

## 2019-12-16 DIAGNOSIS — H9012 Conductive hearing loss, unilateral, left ear, with unrestricted hearing on the contralateral side: Secondary | ICD-10-CM | POA: Diagnosis not present

## 2019-12-16 LAB — CBC WITH DIFFERENTIAL/PLATELET
Abs Immature Granulocytes: 0.01 10*3/uL (ref 0.00–0.07)
Basophils Absolute: 0 10*3/uL (ref 0.0–0.1)
Basophils Relative: 0 %
Eosinophils Absolute: 0 10*3/uL (ref 0.0–0.5)
Eosinophils Relative: 0 %
HCT: 35.5 % — ABNORMAL LOW (ref 39.0–52.0)
Hemoglobin: 11.2 g/dL — ABNORMAL LOW (ref 13.0–17.0)
Immature Granulocytes: 0 %
Lymphocytes Relative: 22 %
Lymphs Abs: 0.7 10*3/uL (ref 0.7–4.0)
MCH: 31 pg (ref 26.0–34.0)
MCHC: 31.5 g/dL (ref 30.0–36.0)
MCV: 98.3 fL (ref 80.0–100.0)
Monocytes Absolute: 1 10*3/uL (ref 0.1–1.0)
Monocytes Relative: 35 %
Neutro Abs: 1.3 10*3/uL — ABNORMAL LOW (ref 1.7–7.7)
Neutrophils Relative %: 43 %
Platelets: 93 10*3/uL — ABNORMAL LOW (ref 150–400)
RBC: 3.61 MIL/uL — ABNORMAL LOW (ref 4.22–5.81)
RDW: 13.9 % (ref 11.5–15.5)
WBC: 3 10*3/uL — ABNORMAL LOW (ref 4.0–10.5)
nRBC: 0 % (ref 0.0–0.2)

## 2019-12-16 LAB — LACTATE DEHYDROGENASE: LDH: 188 U/L (ref 98–192)

## 2019-12-16 LAB — VITAMIN B12: Vitamin B-12: 747 pg/mL (ref 180–914)

## 2019-12-18 ENCOUNTER — Inpatient Hospital Stay (HOSPITAL_BASED_OUTPATIENT_CLINIC_OR_DEPARTMENT_OTHER): Payer: Medicare Other | Admitting: Nurse Practitioner

## 2019-12-18 ENCOUNTER — Other Ambulatory Visit: Payer: Self-pay

## 2019-12-18 DIAGNOSIS — E538 Deficiency of other specified B group vitamins: Secondary | ICD-10-CM | POA: Diagnosis not present

## 2019-12-18 DIAGNOSIS — D4620 Refractory anemia with excess of blasts, unspecified: Secondary | ICD-10-CM | POA: Diagnosis not present

## 2019-12-18 DIAGNOSIS — D469 Myelodysplastic syndrome, unspecified: Secondary | ICD-10-CM | POA: Diagnosis not present

## 2019-12-18 DIAGNOSIS — Z8546 Personal history of malignant neoplasm of prostate: Secondary | ICD-10-CM | POA: Diagnosis not present

## 2019-12-18 DIAGNOSIS — N189 Chronic kidney disease, unspecified: Secondary | ICD-10-CM | POA: Diagnosis not present

## 2019-12-18 NOTE — Assessment & Plan Note (Signed)
1.  Low-grade MDS: -Bone marrow biopsy on 10/27/2015 showed hypercellular marrow with mild dyserythropoiesis and dismegakaryocytes.  Chromosome analysis was normal.  FISH panel was normal.  Flow cytometry on peripheral blood did not show any monoclonal B cell population. -She was normocytic anemia which is stable between 11 and 12. -He also has moderate thrombocytopenia which is stable between 80 and 90,000. -He denies any recurrent infections no recent hospitalizations. -Labs done on 12/16/2019 showed WBC 3.0, hemoglobin 11.2, platelets 93 -We will see him back in 6 months with repeat labs  2.  B12 deficiency: -He is taking B12 injections every 4 weeks. -Labs done on 12/16/2019 showed vitamin B12 level 747  3.  CKD: -Creatinine has been stable between 1.2 and 1.3

## 2019-12-18 NOTE — Progress Notes (Signed)
[place], [place] [address]   CLINIC:  Medical Oncology/Hematology  PCP:  Maryruth Hancock, [place] [address] [phone]   REASON FOR VISIT: Follow-up for MDS   CURRENT THERAPY: Observation   INTERVAL HISTORY:  [name] 86 y.o. male returns for routine follow-up for MDS.  Patient reports he has been doing well since his last visit.  He denies any new pains.  He denies any new bleeding. Denies any nausea, vomiting, or diarrhea. Denies any new pains. Had not noticed any recent bleeding such as epistaxis, hematuria or hematochezia. Denies recent chest pain on exertion, shortness of breath on minimal exertion, pre-syncopal episodes, or palpitations. Denies any numbness or tingling in hands or feet. Denies any recent fevers, infections, or recent hospitalizations. Patient reports appetite at 100% and energy level at 75%.  He is eating well maintain his weight at this time.    REVIEW OF SYSTEMS:  Review of Systems  Respiratory: Positive for shortness of breath (With exertion).   All other systems reviewed and are negative.    PAST MEDICAL/SURGICAL HISTORY:  Past Medical History:  Diagnosis Date  . BPH (benign prostatic hypertrophy)   . Chronic systolic heart failure ([place])   . Coronary artery disease    Multivessel status post CABG 8/06, LIMA-LAD, SVG-CFX, SVG-distal RCA  . Essential hypertension   . Hyperlipidemia   . ICD (implantable cardiac defibrillator) in place    Medtronic  . Ischemic cardiomyopathy   . Myelodysplasia, low grade ([place]) 11/27/2015  . Nephrolithiasis   . Prostate cancer ([place])   . Vitamin B 12 deficiency 07/27/2016   Past Surgical History:  Procedure Laterality Date  . CARDIAC DEFIBRILLATOR PLACEMENT    . COLONOSCOPY    . COLONOSCOPY N/A 08/19/2013   Procedure: COLONOSCOPY;  Surgeon: Rogene Houston, MD;  [facility];  Service: Endoscopy;  Laterality: N/A;  930  . CORONARY ARTERY  BYPASS GRAFT     8/06 with left anterior descending artery, saphenous vein graft to circumflex, saphenous vein graft to distal right coronary artery  . HERNIA REPAIR    . POLYPECTOMY    . PROSTATE SURGERY       SOCIAL HISTORY:  Social History   Socioeconomic History  . Marital status: Married    Spouse name: Not on file  . Number of children: Not on file  . Years of education: Not on file  . Highest education level: Not on file  Occupational History  . Occupation: Retired    Fish farm manager: RETIRED  Tobacco Use  . Smoking status: Former Smoker    Packs/day: 0.25    Years: 44.00    Pack years: 11.00    Types: Cigarettes    Quit date: 05/14/1992    Years since quitting: 27.6  . Smokeless tobacco: Never Used  Vaping Use  . Vaping Use: Never used  Substance and Sexual Activity  . Alcohol use: No  . Drug use: No  . Sexual activity: Not on file    Comment: married 63 years  Other Topics Concern  . Not on file  Social History Narrative  . Not on file   Social Determinants of Health   Financial Resource Strain:   . Difficulty of Paying Living Expenses:   Food Insecurity:   . Worried About Charity fundraiser in the Last Year:   . Arboriculturist in the Last Year:   Transportation Needs:   .  Lack of Transportation (Medical):   Marland Kitchen Lack of Transportation (Non-Medical):   Physical Activity:   . Days of Exercise per Week:   . Minutes of Exercise per Session:   Stress:   . Feeling of Stress :   Social Connections:   . Frequency of Communication with Friends and Family:   . Frequency of Social Gatherings with Friends and Family:   . Attends Religious Services:   . Active Member of Clubs or Organizations:   . Attends Archivist Meetings:   Marland Kitchen Marital Status:   Intimate Partner Violence:   . Fear of Current or Ex-Partner:   . Emotionally Abused:   Marland Kitchen Physically Abused:   . Sexually Abused:     FAMILY HISTORY:  Family History  Problem Relation Age of Onset  .  Colon cancer Father   . Coronary artery disease Other     CURRENT MEDICATIONS:  Outpatient Encounter Medications as of 12/18/2019  Medication Sig  . allopurinol (ZYLOPRIM) 300 MG tablet Take 300 mg by mouth daily.   Marland Kitchen aspirin 81 MG chewable tablet Chew 81 mg by mouth daily.  . carvedilol (COREG) 6.25 MG tablet Take 1 tablet (6.25 mg total) by mouth 2 (two) times daily.  . cetirizine (ZYRTEC) 10 MG tablet Take 10 mg by mouth daily as needed for allergies.   . Cholecalciferol (VITAMIN D3) 1000 UNITS CAPS Take 1 capsule by mouth daily.   . cyanocobalamin (,VITAMIN B-12,) 1000 MCG/ML injection ADMINISTER 1 ML IN THE MUSCLE 1 TIME AS DIRECTED  . losartan (COZAAR) 50 MG tablet Take 1 tablet (50 mg total) by mouth daily.  . simvastatin (ZOCOR) 40 MG tablet TAKE 1 TABLET BY MOUTH EVERY DAY IN THE EVENING  . spironolactone (ALDACTONE) 25 MG tablet TAKE 1/2 TABLET(12.5 MG) BY MOUTH DAILY  . vitamin C (ASCORBIC ACID) 500 MG tablet Take 500 mg by mouth daily.    Marland Kitchen acetaminophen (TYLENOL) 500 MG tablet Take 1 tablet (500 mg total) by mouth every 6 (six) hours as needed for mild pain or fever. (Patient not taking: Reported on 12/18/2019)  . diphenhydrAMINE (BENADRYL) 25 MG tablet Take 25 mg by mouth at bedtime as needed. (Patient not taking: Reported on 12/18/2019)  . nitroGLYCERIN (NITROSTAT) 0.4 MG SL tablet Place 1 tablet (0.4 mg total) under the tongue every 5 (five) minutes as needed. (Patient not taking: Reported on 12/18/2019)   No facility-administered encounter medications on file as of 12/18/2019.    ALLERGIES:  Allergies  Allergen Reactions  . Codeine Nausea And Vomiting     PHYSICAL EXAM:  ECOG Performance status: 1  Vitals:   12/18/19 1118  BP: 127/73  Pulse: 63  Resp: 16  Temp: 97.9 F (36.6 C)  SpO2: 99%   Filed Weights   12/18/19 1118  Weight: 162 lb 4.1 oz (73.6 kg)   Physical Exam Constitutional:      Appearance: Normal appearance. He is normal weight.  Cardiovascular:      Rate and Rhythm: Normal rate and regular rhythm.     Heart sounds: Normal heart sounds.  Pulmonary:     Effort: Pulmonary effort is normal.     Breath sounds: Normal breath sounds.  Abdominal:     General: Bowel sounds are normal.     Palpations: Abdomen is soft.  Musculoskeletal:        General: Normal range of motion.  Skin:    General: Skin is warm.  Neurological:     Mental Status: He is  alert and oriented to person, place, and time. Mental status is at baseline.  Psychiatric:        Mood and Affect: Mood normal.        Behavior: Behavior normal.        Thought Content: Thought content normal.        Judgment: Judgment normal.      LABORATORY DATA:  I have reviewed the labs as listed.  CBC    Component Value Date/Time   WBC 3.0 (L) 12/16/2019 1043   RBC 3.61 (L) 12/16/2019 1043   HGB 11.2 (L) 12/16/2019 1043   HCT 35.5 (L) 12/16/2019 1043   PLT 93 (L) 12/16/2019 1043   MCV 98.3 12/16/2019 1043   MCH 31.0 12/16/2019 1043   MCHC 31.5 12/16/2019 1043   RDW 13.9 12/16/2019 1043   LYMPHSABS 0.7 12/16/2019 1043   MONOABS 1.0 12/16/2019 1043   EOSABS 0.0 12/16/2019 1043   BASOSABS 0.0 12/16/2019 1043   CMP Latest Ref Rng & Units 06/17/2019 06/09/2019 12/04/2018  Glucose 70 - 99 mg/dL 185(H) 103(H) 154(H)  BUN 8 - 23 mg/dL 38(H) 37(H) 33(H)  Creatinine 0.61 - 1.24 mg/dL 1.22 1.28(H) 1.24  Sodium 135 - 145 mmol/L 136 140 138  Potassium 3.5 - 5.1 mmol/L 4.8 4.8 5.0  Chloride 98 - 111 mmol/L 101 105 105  CO2 22 - 32 mmol/L '30 28 25  ' Calcium 8.9 - 10.3 mg/dL 9.1 9.6 9.4  Total Protein 6.5 - 8.1 g/dL 6.4(L) - 6.8  Total Bilirubin 0.3 - 1.2 mg/dL 0.7 - 0.8  Alkaline Phos 38 - 126 U/L 54 - 94  AST 15 - 41 U/L 13(L) - 13(L)  ALT 0 - 44 U/L 11 - 9    All questions were answered to patient's stated satisfaction. Encouraged patient to call with any new concerns or questions before his next visit to the cancer center and we can certain see him sooner, if needed.      ASSESSMENT & PLAN:  Myelodysplasia, low grade ([place]) 1.  Low-grade MDS: -Bone marrow biopsy on 10/27/2015 showed hypercellular marrow with mild dyserythropoiesis and dismegakaryocytes.  Chromosome analysis was normal.  FISH panel was normal.  Flow cytometry on peripheral blood did not show any monoclonal B cell population. -She was normocytic anemia which is stable between 11 and 12. -He also has moderate thrombocytopenia which is stable between 80 and 90,000. -He denies any recurrent infections no recent hospitalizations. -Labs done on 12/16/2019 showed WBC 3.0, hemoglobin 11.2, platelets 93 -We will see him back in 6 months with repeat labs  2.  B12 deficiency: -He is taking B12 injections every 4 weeks. -Labs done on 12/16/2019 showed vitamin B12 level 747  3.  CKD: -Creatinine has been stable between 1.2 and 1.3     Orders placed this encounter:  Orders Placed This Encounter  Procedures  . CBC with Differential/Platelet  . Comprehensive metabolic panel  . Ferritin  . Iron and TIBC  . Lactate dehydrogenase  . Vitamin B12  . VITAMIN D 25 Hydroxy (Vit-D Deficiency, Fractures)      [name], FNP-C [place] [phone]

## 2019-12-23 ENCOUNTER — Ambulatory Visit (HOSPITAL_COMMUNITY): Payer: Medicare Other | Admitting: Nurse Practitioner

## 2019-12-31 ENCOUNTER — Other Ambulatory Visit (HOSPITAL_COMMUNITY): Payer: Self-pay | Admitting: Hematology

## 2019-12-31 DIAGNOSIS — D61818 Other pancytopenia: Secondary | ICD-10-CM

## 2020-01-07 ENCOUNTER — Other Ambulatory Visit: Payer: Self-pay | Admitting: Cardiology

## 2020-01-29 DIAGNOSIS — I10 Essential (primary) hypertension: Secondary | ICD-10-CM | POA: Diagnosis not present

## 2020-01-29 DIAGNOSIS — E782 Mixed hyperlipidemia: Secondary | ICD-10-CM | POA: Diagnosis not present

## 2020-01-29 DIAGNOSIS — Z712 Person consulting for explanation of examination or test findings: Secondary | ICD-10-CM | POA: Diagnosis not present

## 2020-02-01 DIAGNOSIS — E785 Hyperlipidemia, unspecified: Secondary | ICD-10-CM | POA: Diagnosis not present

## 2020-02-01 DIAGNOSIS — D696 Thrombocytopenia, unspecified: Secondary | ICD-10-CM | POA: Diagnosis not present

## 2020-02-01 DIAGNOSIS — I6529 Occlusion and stenosis of unspecified carotid artery: Secondary | ICD-10-CM | POA: Diagnosis not present

## 2020-02-01 DIAGNOSIS — D61818 Other pancytopenia: Secondary | ICD-10-CM | POA: Diagnosis not present

## 2020-02-01 DIAGNOSIS — M1A0720 Idiopathic chronic gout, left ankle and foot, without tophus (tophi): Secondary | ICD-10-CM | POA: Diagnosis not present

## 2020-02-01 DIAGNOSIS — J302 Other seasonal allergic rhinitis: Secondary | ICD-10-CM | POA: Diagnosis not present

## 2020-02-01 DIAGNOSIS — Z951 Presence of aortocoronary bypass graft: Secondary | ICD-10-CM | POA: Diagnosis not present

## 2020-02-01 DIAGNOSIS — D509 Iron deficiency anemia, unspecified: Secondary | ICD-10-CM | POA: Diagnosis not present

## 2020-02-02 DIAGNOSIS — L570 Actinic keratosis: Secondary | ICD-10-CM | POA: Diagnosis not present

## 2020-02-02 DIAGNOSIS — L821 Other seborrheic keratosis: Secondary | ICD-10-CM | POA: Diagnosis not present

## 2020-02-02 DIAGNOSIS — Z85828 Personal history of other malignant neoplasm of skin: Secondary | ICD-10-CM | POA: Diagnosis not present

## 2020-02-04 NOTE — Progress Notes (Signed)
  Cardiology Office Note  Date: 02/05/2020   ID: [name], DOB 06/25/1926, MRN [id number]  PCP:  Hall, John Z, MD  Cardiologist:   , MD Electrophysiologist:  None   Chief Complaint  Patient presents with  . Cardiac follow-up    History of Present Illness: [name] is a 86 y.o. male last seen in March.  He presents for a routine visit.  He states that he has been doing very well, no active angina symptoms, NYHA class II dyspnea with most activities.  He and his wife did some yard work yesterday.  At the last visit we stopped HCTZ, reduced Cozaar to 50 mg daily, and reduce Coreg to 6.25 mg twice daily.  He reports no further spells of lightheadedness.  I reviewed his recent lab work as outlined below.  He continues to follow with Dr. Hall.  Past Medical History:  Diagnosis Date  . BPH (benign prostatic hypertrophy)   . Chronic systolic heart failure ([facility])   . Coronary artery disease    Multivessel status post CABG 8/06, LIMA-LAD, SVG-CFX, SVG-distal RCA  . Essential hypertension   . Hyperlipidemia   . ICD (implantable cardiac defibrillator) in place    Medtronic  . Ischemic cardiomyopathy   . Myelodysplasia, low grade ([facility]) 11/27/2015  . Nephrolithiasis   . Prostate cancer ([facility])   . Vitamin B 12 deficiency 07/27/2016    Past Surgical History:  Procedure Laterality Date  . CARDIAC DEFIBRILLATOR PLACEMENT    . COLONOSCOPY    . COLONOSCOPY N/A 08/19/2013   Procedure: COLONOSCOPY;  Surgeon: Najeeb U Rehman, MD;  [facility];  Service: Endoscopy;  Laterality: N/A;  930  . CORONARY ARTERY BYPASS GRAFT     8/06 with left anterior descending artery, saphenous vein graft to circumflex, saphenous vein graft to distal right coronary artery  . HERNIA REPAIR    . POLYPECTOMY    . PROSTATE SURGERY      Current Outpatient Medications  Medication Sig Dispense Refill  . acetaminophen (TYLENOL) 500 MG tablet Take 1 tablet (500 mg total) by mouth  every 6 (six) hours as needed for mild pain or fever. 30 tablet 0  . allopurinol (ZYLOPRIM) 300 MG tablet Take 300 mg by mouth daily.     . aspirin 81 MG chewable tablet Chew 81 mg by mouth daily.    . carvedilol (COREG) 6.25 MG tablet Take 1 tablet (6.25 mg total) by mouth 2 (two) times daily. 180 tablet 3  . cetirizine (ZYRTEC) 10 MG tablet Take 10 mg by mouth daily as needed for allergies.     . Cholecalciferol (VITAMIN D3) 1000 UNITS CAPS Take 1 capsule by mouth daily.     . cyanocobalamin (,VITAMIN B-12,) 1000 MCG/ML injection ADMINISTER 1 ML IN THE MUSCLE 1 TIME AS DIRECTED 1 mL 14  . diphenhydrAMINE (BENADRYL) 25 MG tablet Take 25 mg by mouth at bedtime as needed.     . losartan (COZAAR) 50 MG tablet Take 1 tablet (50 mg total) by mouth daily. 90 tablet 3  . nitroGLYCERIN (NITROSTAT) 0.4 MG SL tablet Place 1 tablet (0.4 mg total) under the tongue every 5 (five) minutes as needed. 25 tablet 3  . simvastatin (ZOCOR) 40 MG tablet TAKE 1 TABLET BY MOUTH EVERY DAY IN THE EVENING 90 tablet 2  . spironolactone (ALDACTONE) 25 MG tablet TAKE 1/2 TABLET(12.5 MG) BY MOUTH DAILY 45 tablet 3  . vitamin C (ASCORBIC ACID) 500 MG tablet Take 500   mg by mouth daily.       No current facility-administered medications for this visit.   Allergies:  Codeine   ROS:   No palpitations.  Physical Exam: VS:  BP 134/62   Pulse 65   Ht 5' 4" (1.626 m)   Wt 169 lb 3.2 oz (76.7 kg)   SpO2 98%   BMI 29.04 kg/m , BMI Body mass index is 29.04 kg/m.  Wt Readings from Last 3 Encounters:  02/05/20 169 lb 3.2 oz (76.7 kg)  12/18/19 162 lb 4.1 oz (73.6 kg)  07/20/19 172 lb (78 kg)    General:  Elderly male, appears comfortable at rest. HEENT: Conjunctiva and lids normal, wearing a mask. Neck: Supple, no elevated JVP or carotid bruits, no thyromegaly. Lungs: Clear to auscultation, nonlabored breathing at rest. Cardiac: Regular rate and rhythm, no S3, soft systolic murmur. Extremities: No pitting edema, distal  pulses 2+.  ECG:  An ECG dated 06/08/2019 was personally reviewed today and demonstrated:  Sinus bradycardia with right bundle branch block and left anterior fascicular block.  Recent Labwork: 06/17/2019: ALT 11; AST 13; BUN 38; Creatinine, Ser 1.22; Potassium 4.8; Sodium 136 12/16/2019: Hemoglobin 11.2; Platelets 93     Component Value Date/Time   CHOL 132 06/09/2019 0808   TRIG 111 06/09/2019 0808   HDL 35 (L) 06/09/2019 0808   CHOLHDL 3.8 06/09/2019 0808   VLDL 25.0 09/09/2014 1123   LDLCALC 78 06/09/2019 0808  September 2021: Hemoglobin 11.9, platelets 91, BUN 25, creatinine 1.12, potassium 5.2, AST 11, ALT 7, cholesterol 121, triglycerides 104, HDL 33, LDL 68  Other Studies Reviewed Today:  No interval cardiac testing for review today.  Assessment and Plan:  1.  Multivessel CAD status post CABG in 2006.  He does not report any active angina on medical therapy and we will continue with observation.  Current regimen includes aspirin, Coreg, Cozaar, Aldactone, and Zocor.  2.  Ischemic cardiomyopathy with LVEF 45 to 50% range.  Medtronic ICD in place but already met ERI and generator was not changed out per discussion with Dr. Lovena Le.  He reports no palpitations or syncope.  3.  Spells of lightheadedness have resolved following medication adjustments as discussed above.  Medication Adjustments/Labs and Tests Ordered: Current medicines are reviewed at length with the patient today.  Concerns regarding medicines are outlined above.   Tests Ordered: No orders of the defined types were placed in this encounter.   Medication Changes: No orders of the defined types were placed in this encounter.   Disposition:  Follow up 6 months in the [place] office.  Signed, Satira Sark, MD, [facility] 02/05/2020 9:35 AM    [facility] at [facility] [address] [place], [place] [address] Phone: [phone]; Fax: [phone]

## 2020-02-05 ENCOUNTER — Other Ambulatory Visit: Payer: Self-pay

## 2020-02-05 ENCOUNTER — Encounter: Payer: Self-pay | Admitting: Cardiology

## 2020-02-05 ENCOUNTER — Ambulatory Visit (INDEPENDENT_AMBULATORY_CARE_PROVIDER_SITE_OTHER): Payer: Medicare Other | Admitting: Cardiology

## 2020-02-05 VITALS — BP 134/62 | HR 65 | Ht 64.0 in | Wt 169.2 lb

## 2020-02-05 DIAGNOSIS — I25119 Atherosclerotic heart disease of native coronary artery with unspecified angina pectoris: Secondary | ICD-10-CM

## 2020-02-05 DIAGNOSIS — I255 Ischemic cardiomyopathy: Secondary | ICD-10-CM | POA: Diagnosis not present

## 2020-02-05 NOTE — Patient Instructions (Signed)
Medication Instructions:  °Your physician recommends that you continue on your current medications as directed. Please refer to the Current Medication list given to you today. ° °*If you need a refill on your cardiac medications before your next appointment, please call your pharmacy* ° ° °Lab Work: °None today °If you have labs (blood work) drawn today and your tests are completely normal, you will receive your results only by: °• MyChart Message (if you have MyChart) OR °• A paper copy in the mail °If you have any lab test that is abnormal or we need to change your treatment, we will call you to review the results. ° ° °Testing/Procedures: °None today ° ° °Follow-Up: °At [facility], you and your health needs are our priority.  As part of our continuing mission to provide you with exceptional heart care, we have created designated Provider Care Teams.  These Care Teams include your primary Cardiologist (physician) and Advanced Practice Providers (APPs -  Physician Assistants and Nurse Practitioners) who all work together to provide you with the care you need, when you need it. ° °We recommend signing up for the patient portal called "MyChart".  Sign up information is provided on this After Visit Summary.  MyChart is used to connect with patients for Virtual Visits (Telemedicine).  Patients are able to view lab/test results, encounter notes, upcoming appointments, etc.  Non-urgent messages can be sent to your provider as well.   °To learn more about what you can do with MyChart, go to [email or web address].   ° °Your next appointment:   °6 month(s) ° °The format for your next appointment:   °In Person ° °Provider:   °Samuel McDowell, MD ° ° °Other Instructions °None ° ° ° ° °Thank you for choosing [facility] ! ° ° ° ° ° ° ° ° °

## 2020-02-29 DIAGNOSIS — Z23 Encounter for immunization: Secondary | ICD-10-CM | POA: Diagnosis not present

## 2020-03-07 DIAGNOSIS — Z23 Encounter for immunization: Secondary | ICD-10-CM | POA: Diagnosis not present

## 2020-06-12 ENCOUNTER — Other Ambulatory Visit: Payer: Self-pay | Admitting: Cardiology

## 2020-06-20 ENCOUNTER — Other Ambulatory Visit (HOSPITAL_COMMUNITY): Payer: Self-pay | Admitting: *Deleted

## 2020-06-20 DIAGNOSIS — D4620 Refractory anemia with excess of blasts, unspecified: Secondary | ICD-10-CM

## 2020-06-21 ENCOUNTER — Other Ambulatory Visit: Payer: Self-pay

## 2020-06-21 ENCOUNTER — Inpatient Hospital Stay (HOSPITAL_COMMUNITY): Payer: Medicare Other | Attending: Hematology

## 2020-06-21 DIAGNOSIS — D469 Myelodysplastic syndrome, unspecified: Secondary | ICD-10-CM | POA: Insufficient documentation

## 2020-06-21 DIAGNOSIS — E538 Deficiency of other specified B group vitamins: Secondary | ICD-10-CM | POA: Insufficient documentation

## 2020-06-21 DIAGNOSIS — I130 Hypertensive heart and chronic kidney disease with heart failure and stage 1 through stage 4 chronic kidney disease, or unspecified chronic kidney disease: Secondary | ICD-10-CM | POA: Insufficient documentation

## 2020-06-21 DIAGNOSIS — N189 Chronic kidney disease, unspecified: Secondary | ICD-10-CM | POA: Insufficient documentation

## 2020-06-21 DIAGNOSIS — I5022 Chronic systolic (congestive) heart failure: Secondary | ICD-10-CM | POA: Diagnosis not present

## 2020-06-21 DIAGNOSIS — D4620 Refractory anemia with excess of blasts, unspecified: Secondary | ICD-10-CM

## 2020-06-21 LAB — CBC WITH DIFFERENTIAL/PLATELET
Abs Immature Granulocytes: 0 10*3/uL (ref 0.00–0.07)
Basophils Absolute: 0 10*3/uL (ref 0.0–0.1)
Basophils Relative: 0 %
Eosinophils Absolute: 0 10*3/uL (ref 0.0–0.5)
Eosinophils Relative: 0 %
HCT: 37.6 % — ABNORMAL LOW (ref 39.0–52.0)
Hemoglobin: 12.3 g/dL — ABNORMAL LOW (ref 13.0–17.0)
Immature Granulocytes: 0 %
Lymphocytes Relative: 23 %
Lymphs Abs: 0.9 10*3/uL (ref 0.7–4.0)
MCH: 31.5 pg (ref 26.0–34.0)
MCHC: 32.7 g/dL (ref 30.0–36.0)
MCV: 96.2 fL (ref 80.0–100.0)
Monocytes Absolute: 1.3 10*3/uL — ABNORMAL HIGH (ref 0.1–1.0)
Monocytes Relative: 35 %
Neutro Abs: 1.6 10*3/uL — ABNORMAL LOW (ref 1.7–7.7)
Neutrophils Relative %: 42 %
Platelets: 97 10*3/uL — ABNORMAL LOW (ref 150–400)
RBC: 3.91 MIL/uL — ABNORMAL LOW (ref 4.22–5.81)
RDW: 12.8 % (ref 11.5–15.5)
WBC: 3.8 10*3/uL — ABNORMAL LOW (ref 4.0–10.5)
nRBC: 0 % (ref 0.0–0.2)

## 2020-06-21 LAB — COMPREHENSIVE METABOLIC PANEL
ALT: 8 U/L (ref 0–44)
AST: 13 U/L — ABNORMAL LOW (ref 15–41)
Albumin: 3.9 g/dL (ref 3.5–5.0)
Alkaline Phosphatase: 39 U/L (ref 38–126)
Anion gap: 3 — ABNORMAL LOW (ref 5–15)
BUN: 31 mg/dL — ABNORMAL HIGH (ref 8–23)
CO2: 28 mmol/L (ref 22–32)
Calcium: 9.1 mg/dL (ref 8.9–10.3)
Chloride: 107 mmol/L (ref 98–111)
Creatinine, Ser: 1.27 mg/dL — ABNORMAL HIGH (ref 0.61–1.24)
GFR, Estimated: 53 mL/min — ABNORMAL LOW (ref >60)
Glucose, Bld: 135 mg/dL — ABNORMAL HIGH (ref 70–99)
Potassium: 4.2 mmol/L (ref 3.5–5.1)
Sodium: 138 mmol/L (ref 135–145)
Total Bilirubin: 0.8 mg/dL (ref 0.3–1.2)
Total Protein: 6.7 g/dL (ref 6.5–8.1)

## 2020-06-21 LAB — IRON AND TIBC
Iron: 86 ug/dL (ref 45–182)
Saturation Ratios: 27 % (ref 17.9–39.5)
TIBC: 316 ug/dL (ref 250–450)
UIBC: 230 ug/dL

## 2020-06-21 LAB — VITAMIN B12: Vitamin B-12: 701 pg/mL (ref 180–914)

## 2020-06-21 LAB — FERRITIN: Ferritin: 49 ng/mL (ref 24–336)

## 2020-06-21 LAB — VITAMIN D 25 HYDROXY (VIT D DEFICIENCY, FRACTURES): Vit D, 25-Hydroxy: 44.92 ng/mL (ref 30–100)

## 2020-06-21 LAB — LACTATE DEHYDROGENASE: LDH: 185 U/L (ref 98–192)

## 2020-06-28 ENCOUNTER — Inpatient Hospital Stay (HOSPITAL_BASED_OUTPATIENT_CLINIC_OR_DEPARTMENT_OTHER): Payer: Medicare Other | Admitting: Hematology

## 2020-06-28 ENCOUNTER — Other Ambulatory Visit: Payer: Self-pay

## 2020-06-28 VITALS — BP 157/88 | HR 77 | Temp 97.3°F | Resp 18 | Wt 171.4 lb

## 2020-06-28 DIAGNOSIS — N189 Chronic kidney disease, unspecified: Secondary | ICD-10-CM | POA: Diagnosis not present

## 2020-06-28 DIAGNOSIS — I5022 Chronic systolic (congestive) heart failure: Secondary | ICD-10-CM | POA: Diagnosis not present

## 2020-06-28 DIAGNOSIS — D4620 Refractory anemia with excess of blasts, unspecified: Secondary | ICD-10-CM | POA: Diagnosis not present

## 2020-06-28 DIAGNOSIS — I130 Hypertensive heart and chronic kidney disease with heart failure and stage 1 through stage 4 chronic kidney disease, or unspecified chronic kidney disease: Secondary | ICD-10-CM | POA: Diagnosis not present

## 2020-06-28 DIAGNOSIS — E538 Deficiency of other specified B group vitamins: Secondary | ICD-10-CM | POA: Diagnosis not present

## 2020-06-28 DIAGNOSIS — D469 Myelodysplastic syndrome, unspecified: Secondary | ICD-10-CM | POA: Diagnosis not present

## 2020-06-28 NOTE — Patient Instructions (Signed)
[facility] at [facility] Discharge Instructions  You were seen today by Dr. Katragadda. He went over your recent results. Your next appointment will be with the physician assistant in 6 months for labs and follow up.   Thank you for choosing [facility] at [facility] to provide your oncology and hematology care.  To afford each patient quality time with our provider, please arrive at least 15 minutes before your scheduled appointment time.   If you have a lab appointment with the Cancer Center please come in thru the Main Entrance and check in at the main information desk  You need to re-schedule your appointment should you arrive 10 or more minutes late.  We strive to give you quality time with our providers, and arriving late affects you and other patients whose appointments are after yours.  Also, if you no show three or more times for appointments you may be dismissed from the clinic at the providers discretion.     Again, thank you for choosing [facility].  Our hope is that these requests will decrease the amount of time that you wait before being seen by our physicians.       _____________________________________________________________  Should you have questions after your visit to [facility], please contact our office at [phone] between the hours of 8:00 a.m. and 4:30 p.m.  Voicemails left after 4:00 p.m. will not be returned until the following business day.  For prescription refill requests, have your pharmacy contact our office and allow 72 hours.    Cancer Center Support Programs:   > Cancer Support Group  2nd Tuesday of the month 1pm-2pm, Journey Room    

## 2020-06-28 NOTE — Progress Notes (Signed)
[name], [name]   CLINIC:  Medical Oncology/Hematology  PCP:  [name], MD [address] [name] [address]  [phone]  REASON FOR VISIT:  Follow-up for low-grade MDS  PRIOR THERAPY: None  CURRENT THERAPY: Vitamin B12 injection monthly  INTERVAL HISTORY:  [name], a 87 y.o. male, returns for routine follow-up for his low-grade MDS. Deagan was last seen by [name], NP, on 12/18/2019.  Today he reports feeling well. He continues taking the vitamin B12 injections monthly. He denies having any recent infections.  He used to work in [employer] for 46 years. 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 He lives at home with his wife.   REVIEW OF SYSTEMS:  Review of Systems  Constitutional: Positive for appetite change (75%) and fatigue (50%).  All other systems reviewed and are negative.   PAST MEDICAL/SURGICAL HISTORY:  Past Medical History:  Diagnosis Date  . BPH (benign prostatic hypertrophy)   . Chronic systolic heart failure ([facility])   . Coronary artery disease    Multivessel status post CABG 8/06, LIMA-LAD, SVG-CFX, SVG-distal RCA  . Essential hypertension   . Hyperlipidemia   . ICD (implantable cardiac defibrillator) in place    Medtronic  . Ischemic cardiomyopathy   . Myelodysplasia, low grade ([name]) 11/27/2015  . Nephrolithiasis   . Prostate cancer ([name])   . Vitamin B 12 deficiency 07/27/2016   Past Surgical History:  Procedure Laterality Date  . CARDIAC DEFIBRILLATOR PLACEMENT    . COLONOSCOPY    . COLONOSCOPY N/A 08/19/2013   Procedure: COLONOSCOPY;  Surgeon: Rogene Houston, MD;  Location: [facility];  Service: Endoscopy;  Laterality: N/A;  930  . CORONARY ARTERY BYPASS GRAFT     8/06 with left anterior descending artery, saphenous vein graft to circumflex, saphenous vein graft to distal right coronary artery  . HERNIA REPAIR    . POLYPECTOMY     . PROSTATE SURGERY      SOCIAL HISTORY:  Social History   Socioeconomic History  . Marital status: Married    Spouse name: Not on file  . Number of children: Not on file  . Years of education: Not on file  . Highest education level: Not on file  Occupational History  . Occupation: Retired    Fish farm manager: RETIRED  Tobacco Use  . Smoking status: Former Smoker    Packs/day: 0.25    Years: 44.00    Pack years: 11.00    Types: Cigarettes    Quit date: 05/14/1992    Years since quitting: 28.1  . Smokeless tobacco: Never Used  Vaping Use  . Vaping Use: Never used  Substance and Sexual Activity  . Alcohol use: No  . Drug use: No  . Sexual activity: Not on file    Comment: married 63 years  Other Topics Concern  . Not on file  Social History Narrative  . Not on file   Social Determinants of Health   Financial Resource Strain: Not on file  Food Insecurity: Not on file  Transportation Needs: Not on file  Physical Activity: Not on file  Stress: Not on file  Social Connections: Not on file  Intimate Partner Violence: Not on file    FAMILY HISTORY:  Family History  Problem Relation Age of Onset  . Colon cancer Father   . Coronary artery disease Other  CURRENT MEDICATIONS:  Current Outpatient Medications  Medication Sig Dispense Refill  . aspirin 81 MG chewable tablet Chew 81 mg by mouth daily.    . carvedilol (COREG) 6.25 MG tablet Take 1 tablet (6.25 mg total) by mouth 2 (two) times daily. 180 tablet 3  . cetirizine (ZYRTEC) 10 MG tablet Take 10 mg by mouth daily as needed for allergies.    . Cholecalciferol (VITAMIN D3) 1000 UNITS CAPS Take 1 capsule by mouth daily.    . cyanocobalamin (,VITAMIN B-12,) 1000 MCG/ML injection ADMINISTER 1 ML IN THE MUSCLE 1 TIME AS DIRECTED 1 mL 14  . diphenhydrAMINE (BENADRYL) 25 MG tablet Take 25 mg by mouth at bedtime as needed.     Marland Kitchen losartan (COZAAR) 50 MG tablet TAKE 1 TABLET(50 MG) BY MOUTH DAILY 90 tablet 3  . nitroGLYCERIN  (NITROSTAT) 0.4 MG SL tablet Place 1 tablet (0.4 mg total) under the tongue every 5 (five) minutes as needed. 25 tablet 3  . simvastatin (ZOCOR) 40 MG tablet TAKE 1 TABLET BY MOUTH EVERY DAY IN THE EVENING 90 tablet 2  . spironolactone (ALDACTONE) 25 MG tablet TAKE 1/2 TABLET(12.5 MG) BY MOUTH DAILY 45 tablet 3  . vitamin C (ASCORBIC ACID) 500 MG tablet Take 500 mg by mouth daily.      Marland Kitchen acetaminophen (TYLENOL) 500 MG tablet Take 1 tablet (500 mg total) by mouth every 6 (six) hours as needed for mild pain or fever. (Patient not taking: Reported on 06/28/2020) 30 tablet 0   No current facility-administered medications for this visit.    ALLERGIES:  Allergies  Allergen Reactions  . Codeine Nausea And Vomiting    PHYSICAL EXAM:  Performance status (ECOG): 1 - Symptomatic but completely ambulatory  Vitals:   06/28/20 1446  BP: (!) 157/88  Pulse: 77  Resp: 18  Temp: (!) 97.3 F (36.3 C)  SpO2: 97%   Wt Readings from Last 3 Encounters:  06/28/20 171 lb 6.4 oz (77.7 kg)  02/05/20 169 lb 3.2 oz (76.7 kg)  12/18/19 162 lb 4.1 oz (73.6 kg)   Physical Exam Vitals reviewed.  Constitutional:      Appearance: Normal appearance.  Cardiovascular:     Rate and Rhythm: Normal rate and regular rhythm.     Pulses: Normal pulses.     Heart sounds: Normal heart sounds.  Pulmonary:     Effort: Pulmonary effort is normal.     Breath sounds: Normal breath sounds.  Neurological:     General: No focal deficit present.     Mental Status: He is alert and oriented to person, place, and time.  Psychiatric:        Mood and Affect: Mood normal.        Behavior: Behavior normal.     LABORATORY DATA:  I have reviewed the labs as listed.  CBC Latest Ref Rng & Units 06/21/2020 12/16/2019 06/17/2019  WBC 4.0 - 10.5 K/uL 3.8(L) 3.0(L) 3.5(L)  Hemoglobin 13.0 - 17.0 g/dL 12.3(L) 11.2(L) 11.3(L)  Hematocrit 39.0 - 52.0 % 37.6(L) 35.5(L) 35.6(L)  Platelets 150 - 400 K/uL 97(L) 93(L) 90(L)   CMP Latest Ref  Rng & Units 06/21/2020 06/17/2019 06/09/2019  Glucose 70 - 99 mg/dL 135(H) 185(H) 103(H)  BUN 8 - 23 mg/dL 31(H) 38(H) 37(H)  Creatinine 0.61 - 1.24 mg/dL 1.27(H) 1.22 1.28(H)  Sodium 135 - 145 mmol/L 138 136 140  Potassium 3.5 - 5.1 mmol/L 4.2 4.8 4.8  Chloride 98 - 111 mmol/L 107 101 105  CO2  22 - 32 mmol/L '28 30 28  ' Calcium 8.9 - 10.3 mg/dL 9.1 9.1 9.6  Total Protein 6.5 - 8.1 g/dL 6.7 6.4(L) -  Total Bilirubin 0.3 - 1.2 mg/dL 0.8 0.7 -  Alkaline Phos 38 - 126 U/L 39 54 -  AST 15 - 41 U/L 13(L) 13(L) -  ALT 0 - 44 U/L 8 11 -      Component Value Date/Time   RBC 3.91 (L) 06/21/2020 1110   MCV 96.2 06/21/2020 1110   MCH 31.5 06/21/2020 1110   MCHC 32.7 06/21/2020 1110   RDW 12.8 06/21/2020 1110   LYMPHSABS 0.9 06/21/2020 1110   MONOABS 1.3 (H) 06/21/2020 1110   EOSABS 0.0 06/21/2020 1110   BASOSABS 0.0 06/21/2020 1110    DIAGNOSTIC IMAGING:  I have independently reviewed the scans and discussed with the patient. No results found.   ASSESSMENT:  1.  Low-grade MDS: -Bone marrow biopsy on 10/27/2015 showed hypercellular marrow with mild dyserythropoiesis and dismegakaryocytes.  Chromosome analysis was normal.  FISH panel was normal.  Flow cytometry on peripheral blood did not show any monoclonal B cell population. -She was normocytic anemia which is stable between 11 and 12. -He also has moderate thrombocytopenia which is stable between 80 and 90,000. -He denies any recurrent infections no recent hospitalizations. -Labs done on 12/16/2019 showed WBC 3.0, hemoglobin 11.2, platelets 93 -We will see him back in 6 months with repeat labs  2.  B12 deficiency: -He is taking B12 injections every 4 weeks.   PLAN:  1.  Low-grade MDS: -Denies any recurrent infections or bleeding issues. -Labs from 06/21/2020 shows normal LDH with white count 3.8 and ANC of 1.6.  Platelet count is stable at 97.  Hemoglobin is also stable at 12.3. -RTC 6 months for follow-up with repeat CBC, ferritin, iron  panel, B12 level.  2.  B12 deficiency: -B12 is 701.  Continue B12 injections monthly.  3.  CKD: -This has been stable between 1.2 and 1.3.  Orders placed this encounter:  No orders of the defined types were placed in this encounter.    Derek Jack, MD [facility] [phone]   I, Milinda Antis, am acting as a scribe for Dr. Sanda Linger.  I, Derek Jack MD, have reviewed the above documentation for accuracy and completeness, and I agree with the above.

## 2020-07-28 DIAGNOSIS — M1A0720 Idiopathic chronic gout, left ankle and foot, without tophus (tophi): Secondary | ICD-10-CM | POA: Diagnosis not present

## 2020-07-28 DIAGNOSIS — Z0189 Encounter for other specified special examinations: Secondary | ICD-10-CM | POA: Diagnosis not present

## 2020-07-28 DIAGNOSIS — D61818 Other pancytopenia: Secondary | ICD-10-CM | POA: Diagnosis not present

## 2020-07-28 DIAGNOSIS — D509 Iron deficiency anemia, unspecified: Secondary | ICD-10-CM | POA: Diagnosis not present

## 2020-07-28 DIAGNOSIS — Z712 Person consulting for explanation of examination or test findings: Secondary | ICD-10-CM | POA: Diagnosis not present

## 2020-07-28 DIAGNOSIS — E785 Hyperlipidemia, unspecified: Secondary | ICD-10-CM | POA: Diagnosis not present

## 2020-07-28 DIAGNOSIS — R7301 Impaired fasting glucose: Secondary | ICD-10-CM | POA: Diagnosis not present

## 2020-07-28 DIAGNOSIS — D696 Thrombocytopenia, unspecified: Secondary | ICD-10-CM | POA: Diagnosis not present

## 2020-08-03 DIAGNOSIS — D4620 Refractory anemia with excess of blasts, unspecified: Secondary | ICD-10-CM | POA: Diagnosis not present

## 2020-08-03 DIAGNOSIS — Z951 Presence of aortocoronary bypass graft: Secondary | ICD-10-CM | POA: Diagnosis not present

## 2020-08-03 DIAGNOSIS — J302 Other seasonal allergic rhinitis: Secondary | ICD-10-CM | POA: Diagnosis not present

## 2020-08-03 DIAGNOSIS — I6529 Occlusion and stenosis of unspecified carotid artery: Secondary | ICD-10-CM | POA: Diagnosis not present

## 2020-08-03 DIAGNOSIS — D696 Thrombocytopenia, unspecified: Secondary | ICD-10-CM | POA: Diagnosis not present

## 2020-08-03 DIAGNOSIS — H6122 Impacted cerumen, left ear: Secondary | ICD-10-CM | POA: Diagnosis not present

## 2020-08-03 DIAGNOSIS — D61818 Other pancytopenia: Secondary | ICD-10-CM | POA: Diagnosis not present

## 2020-08-03 DIAGNOSIS — M1A0720 Idiopathic chronic gout, left ankle and foot, without tophus (tophi): Secondary | ICD-10-CM | POA: Diagnosis not present

## 2020-08-03 DIAGNOSIS — D509 Iron deficiency anemia, unspecified: Secondary | ICD-10-CM | POA: Diagnosis not present

## 2020-08-03 DIAGNOSIS — D519 Vitamin B12 deficiency anemia, unspecified: Secondary | ICD-10-CM | POA: Diagnosis not present

## 2020-08-03 DIAGNOSIS — E785 Hyperlipidemia, unspecified: Secondary | ICD-10-CM | POA: Diagnosis not present

## 2020-09-22 ENCOUNTER — Ambulatory Visit: Payer: Medicare Other | Admitting: Cardiology

## 2020-09-29 ENCOUNTER — Other Ambulatory Visit: Payer: Self-pay | Admitting: Cardiology

## 2020-10-04 ENCOUNTER — Ambulatory Visit (INDEPENDENT_AMBULATORY_CARE_PROVIDER_SITE_OTHER): Payer: Medicare Other | Admitting: Cardiology

## 2020-10-04 ENCOUNTER — Encounter: Payer: Self-pay | Admitting: Cardiology

## 2020-10-04 ENCOUNTER — Other Ambulatory Visit: Payer: Self-pay

## 2020-10-04 VITALS — BP 144/66 | HR 88 | Ht 64.0 in | Wt 169.0 lb

## 2020-10-04 DIAGNOSIS — I25119 Atherosclerotic heart disease of native coronary artery with unspecified angina pectoris: Secondary | ICD-10-CM

## 2020-10-04 DIAGNOSIS — I255 Ischemic cardiomyopathy: Secondary | ICD-10-CM

## 2020-10-04 NOTE — Progress Notes (Signed)
Cardiology Office Note  Date: 10/04/2020   ID: [name], DOB [birth year], MRN [id number]  PCP:  [name], MD  Cardiologist:  Rozann Lesches, MD Electrophysiologist:  None   Chief Complaint  Patient presents with  . Cardiac follow-up    History of Present Illness: [name] is a 87 y.o. male last seen in September 2021.  He is here today with his wife for a follow-up visit.  He does not report any angina symptoms or nitroglycerin use since last encounter.  We went over his medications which are stable and outlined below.  He had lab work in March, noted below.  LDL was 77 at that time.  I personally reviewed his ECG today which shows normal sinus rhythm.  Past Medical History:  Diagnosis Date  . BPH (benign prostatic hypertrophy)   . Chronic systolic heart failure ([facility])   . Coronary artery disease    Multivessel status post CABG 8/06, LIMA-LAD, SVG-CFX, SVG-distal RCA  . Essential hypertension   . Hyperlipidemia   . ICD (implantable cardiac defibrillator) in place    Medtronic  . Ischemic cardiomyopathy   . Myelodysplasia, low grade ([facility]) 11/27/2015  . Nephrolithiasis   . Prostate cancer ([facility])   . Vitamin B 12 deficiency 07/27/2016    Past Surgical History:  Procedure Laterality Date  . CARDIAC DEFIBRILLATOR PLACEMENT    . COLONOSCOPY    . COLONOSCOPY N/A 08/19/2013   Procedure: COLONOSCOPY;  Surgeon: Rogene Houston, MD;  [facility];  Service: Endoscopy;  Laterality: N/A;  930  . CORONARY ARTERY BYPASS GRAFT     8/06 with left anterior descending artery, saphenous vein graft to circumflex, saphenous vein graft to distal right coronary artery  . HERNIA REPAIR    . POLYPECTOMY    . PROSTATE SURGERY      Current Outpatient Medications  Medication Sig Dispense Refill  . acetaminophen (TYLENOL) 500 MG tablet Take 1 tablet (500 mg total) by mouth every 6 (six) hours as needed for mild pain or fever. 30 tablet 0  . aspirin 81 MG chewable  tablet Chew 81 mg by mouth daily.    . carvedilol (COREG) 6.25 MG tablet TAKE 1 TABLET(6.25 MG) BY MOUTH TWICE DAILY 180 tablet 3  . cetirizine (ZYRTEC) 10 MG tablet Take 10 mg by mouth daily as needed for allergies.    . Cholecalciferol (VITAMIN D3) 1000 UNITS CAPS Take 1 capsule by mouth daily.    . cyanocobalamin (,VITAMIN B-12,) 1000 MCG/ML injection ADMINISTER 1 ML IN THE MUSCLE 1 TIME AS DIRECTED 1 mL 14  . diphenhydrAMINE (BENADRYL) 25 MG tablet Take 25 mg by mouth at bedtime as needed.     Marland Kitchen losartan (COZAAR) 50 MG tablet TAKE 1 TABLET(50 MG) BY MOUTH DAILY 90 tablet 3  . nitroGLYCERIN (NITROSTAT) 0.4 MG SL tablet Place 1 tablet (0.4 mg total) under the tongue every 5 (five) minutes as needed. 25 tablet 3  . simvastatin (ZOCOR) 40 MG tablet TAKE 1 TABLET BY MOUTH EVERY DAY IN THE EVENING 90 tablet 2  . spironolactone (ALDACTONE) 25 MG tablet TAKE 1/2 TABLET(12.5 MG) BY MOUTH DAILY 45 tablet 3  . vitamin C (ASCORBIC ACID) 500 MG tablet Take 500 mg by mouth daily.       No current facility-administered medications for this visit.   Allergies:  Codeine   ROS: No palpitations or syncope.  Physical Exam: VS:  BP (!) 144/66   Pulse 88  Ht 5\' 4"  (1.626 m)   Wt 169 lb (76.7 kg)   SpO2 96%   BMI 29.01 kg/m , BMI Body mass index is 29.01 kg/m.  Wt Readings from Last 3 Encounters:  10/04/20 169 lb (76.7 kg)  06/28/20 171 lb 6.4 oz (77.7 kg)  02/05/20 169 lb 3.2 oz (76.7 kg)    General: Elderly male, appears comfortable at rest. HEENT: Conjunctiva and lids normal, wearing a mask. Neck: Supple, no elevated JVP or carotid bruits, no thyromegaly. Lungs: Clear to auscultation, nonlabored breathing at rest. Cardiac: Regular rate and rhythm, no S3, soft  systolic murmur, no pericardial rub. Extremities: No pitting edema.  ECG:  An ECG dated 06/08/2019 was personally reviewed today and demonstrated:  Sinus bradycardia with right bundle branch block and left anterior fascicular  block.  Recent Labwork: 06/21/2020: ALT 8; AST 13; BUN 31; Creatinine, Ser 1.27; Hemoglobin 12.3; Platelets 97; Potassium 4.2; Sodium 138     Component Value Date/Time   CHOL 132 06/09/2019 0808   TRIG 111 06/09/2019 0808   HDL 35 (L) 06/09/2019 0808   CHOLHDL 3.8 06/09/2019 0808   VLDL 25.0 09/09/2014 1123   LDLCALC 78 06/09/2019 0808  March 2022: Hemoglobin 13.2, platelets 96, BUN 30, creatinine 1.24, potassium 5.0, AST 13, ALT 10, cholesterol 131, triglycerides 84, HDL 38, LDL 77, hemoglobin A1c 6.1%  Other Studies Reviewed Today:  No interval cardiac testing for review today.  Assessment and Plan:  1.  Multivessel CAD status post CABG in 2006.  We will continue with observation on medical therapy in the absence of progressive angina.  ECG is normal today.  Continue aspirin, Coreg, Cozaar, Aldactone, and Zocor.  He has as needed nitroglycerin available.  2.  Ischemic cardiomyopathy, LVEF 45 to 50%.  No palpitations or syncope.  He has a Medtronic ICD in place with an active generator, not replaced per discussion with Dr. Lovena Le.  Medication Adjustments/Labs and Tests Ordered: Current medicines are reviewed at length with the patient today.  Concerns regarding medicines are outlined above.   Tests Ordered: Orders Placed This Encounter  Procedures  . EKG 12-Lead    Medication Changes: No orders of the defined types were placed in this encounter.   Disposition:  Follow up 6 months.  Signed, Satira Sark, MD, [facility] 10/04/2020 9:43 AM    [facility] [place]. [address] [place],  [address] Phone: [phone]; Fax: [phone]

## 2020-10-04 NOTE — Patient Instructions (Signed)

## 2020-11-04 ENCOUNTER — Emergency Department (HOSPITAL_COMMUNITY): Payer: Medicare Other

## 2020-11-04 ENCOUNTER — Encounter (HOSPITAL_COMMUNITY): Payer: Self-pay | Admitting: Emergency Medicine

## 2020-11-04 ENCOUNTER — Emergency Department (HOSPITAL_COMMUNITY)
Admission: EM | Admit: 2020-11-04 | Discharge: 2020-11-04 | Disposition: A | Discharge status: Home / Self Care | Payer: Medicare Other | Source: Home / Self Care | Attending: Emergency Medicine | Admitting: Emergency Medicine

## 2020-11-04 ENCOUNTER — Other Ambulatory Visit: Payer: Self-pay

## 2020-11-04 DIAGNOSIS — R1031 Right lower quadrant pain: Secondary | ICD-10-CM

## 2020-11-04 DIAGNOSIS — Z8546 Personal history of malignant neoplasm of prostate: Secondary | ICD-10-CM | POA: Insufficient documentation

## 2020-11-04 DIAGNOSIS — N289 Disorder of kidney and ureter, unspecified: Secondary | ICD-10-CM | POA: Insufficient documentation

## 2020-11-04 DIAGNOSIS — D696 Thrombocytopenia, unspecified: Secondary | ICD-10-CM

## 2020-11-04 DIAGNOSIS — Z20822 Contact with and (suspected) exposure to covid-19: Secondary | ICD-10-CM | POA: Diagnosis not present

## 2020-11-04 DIAGNOSIS — N139 Obstructive and reflux uropathy, unspecified: Secondary | ICD-10-CM | POA: Diagnosis not present

## 2020-11-04 DIAGNOSIS — N201 Calculus of ureter: Secondary | ICD-10-CM | POA: Diagnosis not present

## 2020-11-04 DIAGNOSIS — N1831 Chronic kidney disease, stage 3a: Secondary | ICD-10-CM | POA: Diagnosis not present

## 2020-11-04 DIAGNOSIS — I110 Hypertensive heart disease with heart failure: Secondary | ICD-10-CM | POA: Insufficient documentation

## 2020-11-04 DIAGNOSIS — I10 Essential (primary) hypertension: Secondary | ICD-10-CM | POA: Diagnosis not present

## 2020-11-04 DIAGNOSIS — I5022 Chronic systolic (congestive) heart failure: Secondary | ICD-10-CM | POA: Insufficient documentation

## 2020-11-04 DIAGNOSIS — R112 Nausea with vomiting, unspecified: Secondary | ICD-10-CM | POA: Insufficient documentation

## 2020-11-04 DIAGNOSIS — D649 Anemia, unspecified: Secondary | ICD-10-CM

## 2020-11-04 DIAGNOSIS — I130 Hypertensive heart and chronic kidney disease with heart failure and stage 1 through stage 4 chronic kidney disease, or unspecified chronic kidney disease: Secondary | ICD-10-CM | POA: Diagnosis not present

## 2020-11-04 DIAGNOSIS — Z87891 Personal history of nicotine dependence: Secondary | ICD-10-CM | POA: Insufficient documentation

## 2020-11-04 DIAGNOSIS — N179 Acute kidney failure, unspecified: Secondary | ICD-10-CM | POA: Diagnosis not present

## 2020-11-04 DIAGNOSIS — Z951 Presence of aortocoronary bypass graft: Secondary | ICD-10-CM | POA: Insufficient documentation

## 2020-11-04 DIAGNOSIS — N131 Hydronephrosis with ureteral stricture, not elsewhere classified: Secondary | ICD-10-CM | POA: Diagnosis not present

## 2020-11-04 DIAGNOSIS — Z79899 Other long term (current) drug therapy: Secondary | ICD-10-CM | POA: Insufficient documentation

## 2020-11-04 DIAGNOSIS — I2510 Atherosclerotic heart disease of native coronary artery without angina pectoris: Secondary | ICD-10-CM | POA: Insufficient documentation

## 2020-11-04 DIAGNOSIS — R109 Unspecified abdominal pain: Secondary | ICD-10-CM | POA: Diagnosis not present

## 2020-11-04 DIAGNOSIS — Z7982 Long term (current) use of aspirin: Secondary | ICD-10-CM | POA: Insufficient documentation

## 2020-11-04 DIAGNOSIS — E278 Other specified disorders of adrenal gland: Secondary | ICD-10-CM

## 2020-11-04 DIAGNOSIS — K5900 Constipation, unspecified: Secondary | ICD-10-CM | POA: Insufficient documentation

## 2020-11-04 LAB — CBC
HCT: 39 % (ref 39.0–52.0)
Hemoglobin: 12.8 g/dL — ABNORMAL LOW (ref 13.0–17.0)
MCH: 31.5 pg (ref 26.0–34.0)
MCHC: 32.8 g/dL (ref 30.0–36.0)
MCV: 96.1 fL (ref 80.0–100.0)
Platelets: 90 10*3/uL — ABNORMAL LOW (ref 150–400)
RBC: 4.06 MIL/uL — ABNORMAL LOW (ref 4.22–5.81)
RDW: 13.1 % (ref 11.5–15.5)
WBC: 4.9 10*3/uL (ref 4.0–10.5)
nRBC: 0 % (ref 0.0–0.2)

## 2020-11-04 LAB — COMPREHENSIVE METABOLIC PANEL
ALT: 11 U/L (ref 0–44)
AST: 13 U/L — ABNORMAL LOW (ref 15–41)
Albumin: 4 g/dL (ref 3.5–5.0)
Alkaline Phosphatase: 41 U/L (ref 38–126)
Anion gap: 7 (ref 5–15)
BUN: 30 mg/dL — ABNORMAL HIGH (ref 8–23)
CO2: 28 mmol/L (ref 22–32)
Calcium: 9.3 mg/dL (ref 8.9–10.3)
Chloride: 105 mmol/L (ref 98–111)
Creatinine, Ser: 1.5 mg/dL — ABNORMAL HIGH (ref 0.61–1.24)
GFR, Estimated: 44 mL/min — ABNORMAL LOW (ref >60)
Glucose, Bld: 116 mg/dL — ABNORMAL HIGH (ref 70–99)
Potassium: 4.2 mmol/L (ref 3.5–5.1)
Sodium: 140 mmol/L (ref 135–145)
Total Bilirubin: 0.8 mg/dL (ref 0.3–1.2)
Total Protein: 6.7 g/dL (ref 6.5–8.1)

## 2020-11-04 LAB — URINALYSIS, ROUTINE W REFLEX MICROSCOPIC
Bacteria, UA: NONE SEEN
Bilirubin Urine: NEGATIVE
Glucose, UA: NEGATIVE mg/dL
Ketones, ur: 5 mg/dL — AB
Leukocytes,Ua: NEGATIVE
Nitrite: NEGATIVE
Protein, ur: 100 mg/dL — AB
Specific Gravity, Urine: 1.025 (ref 1.005–1.030)
pH: 5 (ref 5.0–8.0)

## 2020-11-04 LAB — LIPASE, BLOOD: Lipase: 23 U/L (ref 11–51)

## 2020-11-04 MED ORDER — MORPHINE SULFATE (PF) 4 MG/ML IV SOLN
4.0000 mg | Freq: Once | INTRAVENOUS | Status: AC
  Administered 2020-11-04: 4 mg via INTRAVENOUS
  Filled 2020-11-04: qty 1

## 2020-11-04 MED ORDER — IOHEXOL 300 MG/ML  SOLN
80.0000 mL | Freq: Once | INTRAMUSCULAR | Status: AC | PRN
  Administered 2020-11-04: 80 mL via INTRAVENOUS

## 2020-11-04 MED ORDER — ONDANSETRON HCL 4 MG/2ML IJ SOLN
4.0000 mg | Freq: Once | INTRAMUSCULAR | Status: AC
  Administered 2020-11-04: 4 mg via INTRAVENOUS
  Filled 2020-11-04: qty 2

## 2020-11-04 MED ORDER — HYDROCODONE-ACETAMINOPHEN 5-325 MG PO TABS: 1.0000 | ORAL_TABLET | Freq: Four times a day (QID) | ORAL | 0 refills | Status: DC | PRN

## 2020-11-04 NOTE — ED Triage Notes (Signed)
Pt c/o RLQ abdominal pain that has been intermittent x 3 days with N/V

## 2020-11-04 NOTE — ED Provider Notes (Signed)
Care of the patient assumed at the change of shift. Here for RLQ abdominal pain. CT and UA pending.  Physical Exam  BP 138/60   Pulse 84   Temp 98.3 F (36.8 C) (Oral)   Resp 20   Ht 5\' 4"  (1.626 m)   Wt 75.3 kg   SpO2 93%   BMI 28.49 kg/m   Physical Exam Resting comfortably Pain improved.  No peritoneal signs ED Course/Procedures     Procedures  MDM  CT results reviewed, there is a distal ureteral stone as the likely cause of his pain. He also has an unrelated L adrenal mass that is new from prior CT in 2018. He was informed of the adrenal mass and need for outpatient evaluation of same. His pain is currently well controlled. Awaiting UA.   9:25 AM UA is negative. Patient's pain remains well controlled. Will d/c home with Rx for norco as needed for pain. Follow up with Urology for the kidney stone, he has seen Dr. Rosana Hoes before for prostate. They can evaluate his adrenal mass too.        Truddie Hidden, MD 11/04/20 [phone]

## 2020-11-04 NOTE — ED Provider Notes (Signed)
[facility] EMERGENCY DEPARTMENT Provider Note   CSN: [id number] Arrival date & time: 11/04/20  0343     History Chief Complaint  Patient presents with   Abdominal Pain    [name] is a 87 y.o. male.  The history is provided by the patient.  Abdominal Pain He has history of hypertension, hyperlipidemia, systolic heart failure, prostate cancer and comes in because of right lower quadrant abdominal pain.  Pain has been present for about 3days.  It initially was intermittent, but became constant tonight.  Pain is also been getting more severe and he currently rates it at 9/10.  There is no radiation of pain.  There has been nausea and vomiting.  He has had constipation as well.  He denies fever, chills, sweats.  He denies any radiation to the back or flank.  Nothing makes the pain better, nothing makes it worse.  He has had surgery for hernia repair but has not had any other abdominal surgery.   Past Medical History:  Diagnosis Date   BPH (benign prostatic hypertrophy)    Chronic systolic heart failure ([facility])    Coronary artery disease    Multivessel status post CABG 8/06, LIMA-LAD, SVG-CFX, SVG-distal RCA   Essential hypertension    Hyperlipidemia    ICD (implantable cardiac defibrillator) in place    Medtronic   Ischemic cardiomyopathy    Myelodysplasia, low grade ([place]) 11/27/2015   Nephrolithiasis    Prostate cancer ([place])    Vitamin B 12 deficiency 07/27/2016    Patient Active Problem List   Diagnosis Date Noted   Gout involving toe 05/26/2019   Vitamin B 12 deficiency 07/27/2016   Myelodysplasia, low grade ([place]) 11/27/2015   Other primary cardiomyopathies 08/07/2011   Carotid stenosis 07/26/2011   Automatic implantable cardioverter-defibrillator in situ 07/26/2009   CAD, AUTOLOGOUS BYPASS GRAFT 08/24/2008   PROSTATE CANCER 08/03/2008   Other hyperlipidemia 08/03/2008   Essential hypertension 08/03/2008   MYOCARDIAL INFARCTION, HX OF 08/03/2008   Coronary  atherosclerosis 08/03/2008   CARDIOMYOPATHY, ISCHEMIC 40/98/1191   SYSTOLIC HEART FAILURE, CHRONIC 08/03/2008   NEPHROLITHIASIS, HX OF 08/03/2008   POSTSURGICAL AORTOCORONARY BYPASS STATUS 08/03/2008    Past Surgical History:  Procedure Laterality Date   CARDIAC DEFIBRILLATOR PLACEMENT     COLONOSCOPY     COLONOSCOPY N/A 08/19/2013   Procedure: COLONOSCOPY;  Surgeon: Rogene Houston, MD;  [facility];  Service: Endoscopy;  Laterality: N/A;  34   CORONARY ARTERY BYPASS GRAFT     8/06 with left anterior descending artery, saphenous vein graft to circumflex, saphenous vein graft to distal right coronary artery   HERNIA REPAIR     POLYPECTOMY     PROSTATE SURGERY         Family History  Problem Relation Age of Onset   Colon cancer Father    Coronary artery disease Other     Social History   Tobacco Use   Smoking status: Former    Packs/day: 0.25    Years: 44.00    Pack years: 11.00    Types: Cigarettes    Quit date: 05/14/1992    Years since quitting: 28.4   Smokeless tobacco: Never  Vaping Use   Vaping Use: Never used  Substance Use Topics   Alcohol use: No   Drug use: No    Home Medications Prior to Admission medications   Medication Sig Start Date End Date Taking? Authorizing Provider  acetaminophen (TYLENOL) 500 MG tablet Take 1 tablet (500  mg total) by mouth every 6 (six) hours as needed for mild pain or fever. 07/14/15   Carmin Muskrat, MD  aspirin 81 MG chewable tablet Chew 81 mg by mouth daily.    [provider]  carvedilol (COREG) 6.25 MG tablet TAKE 1 TABLET(6.25 MG) BY MOUTH TWICE DAILY 09/29/20   Satira Sark, MD  cetirizine (ZYRTEC) 10 MG tablet Take 10 mg by mouth daily as needed for allergies.    [provider]  Cholecalciferol (VITAMIN D3) 1000 UNITS CAPS Take 1 capsule by mouth daily.    [provider]  cyanocobalamin (,VITAMIN B-12,) 1000 MCG/ML injection ADMINISTER 1 ML IN THE MUSCLE 1 TIME AS DIRECTED  12/31/19   Derek Jack, MD  diphenhydrAMINE (BENADRYL) 25 MG tablet Take 25 mg by mouth at bedtime as needed.     [provider]  losartan (COZAAR) 50 MG tablet TAKE 1 TABLET(50 MG) BY MOUTH DAILY 06/13/20   Satira Sark, MD  nitroGLYCERIN (NITROSTAT) 0.4 MG SL tablet Place 1 tablet (0.4 mg total) under the tongue every 5 (five) minutes as needed. 06/08/19   Imogene Burn, PA-C  simvastatin (ZOCOR) 40 MG tablet TAKE 1 TABLET BY MOUTH EVERY DAY IN THE EVENING 06/13/20   Satira Sark, MD  spironolactone (ALDACTONE) 25 MG tablet TAKE 1/2 TABLET(12.5 MG) BY MOUTH DAILY 06/13/20   Satira Sark, MD  vitamin C (ASCORBIC ACID) 500 MG tablet Take 500 mg by mouth daily.      [provider]    Allergies    Codeine  Review of Systems   Review of Systems  Gastrointestinal:  Positive for abdominal pain.  All other systems reviewed and are negative.  Physical Exam Updated Vital Signs BP (!) 150/68   Pulse 66   Temp 98.3 F (36.8 C) (Oral)   Resp (!) 22   Ht 5\' 4"  (1.626 m)   Wt 75.3 kg   SpO2 96%   BMI 28.49 kg/m   Physical Exam Vitals and nursing note reviewed.  87 year old male, resting comfortably and in no acute distress. Vital signs are significant for elevated blood pressure and borderline elevated respiratory rate. Oxygen saturation is 96%, which is normal. Head is normocephalic and atraumatic. PERRLA, EOMI. Oropharynx is clear. Neck is nontender and supple without adenopathy or JVD. Back is nontender and there is no CVA tenderness. Lungs are clear without rales, wheezes, or rhonchi. Chest is nontender. Heart has regular rate and rhythm without murmur. Abdomen is soft, flat, with moderate right lower quadrant tenderness which is fairly well localized.  There is slight voluntary guarding but no rebound tenderness.  There is mild tenderness to percussion.  There are no masses or hepatosplenomegaly and peristalsis is hypoactive. Extremities  have no cyanosis or edema, full range of motion is present. Skin is warm and dry without rash. Neurologic: Mental status is normal, cranial nerves are intact, there are no motor or sensory deficits.  ED Results / Procedures / Treatments   Labs (all labs ordered are listed, but only abnormal results are displayed) Labs Reviewed  COMPREHENSIVE METABOLIC PANEL - Abnormal; Notable for the following components:      Result Value   Glucose, Bld 116 (*)    BUN 30 (*)    Creatinine, Ser 1.50 (*)    AST 13 (*)    GFR, Estimated 44 (*)    All other components within normal limits  CBC - Abnormal; Notable for the following components:  RBC 4.06 (*)    Hemoglobin 12.8 (*)    Platelets 90 (*)    All other components within normal limits  LIPASE, BLOOD  URINALYSIS, ROUTINE W REFLEX MICROSCOPIC    EKG EKG Interpretation  Date/Time:  Friday November 04 2020 04:44:45 EDT Ventricular Rate:  70 PR Interval:  179 QRS Duration: 150 QT Interval:  424 QTC Calculation: 458 R Axis:   -70 Text Interpretation: Sinus rhythm Atrial premature complex RBBB and LAFB When compared with ECG of 12/15/2004, Right bundle branch block is now present HEART RATE has decreased Confirmed by Delora Fuel ([id number]) on 11/04/2020 5:39:29 AM  Radiology No results found.  Procedures Procedures   Medications Ordered in ED Medications  ondansetron (ZOFRAN) injection 4 mg (has no administration in time range)  morphine 4 MG/ML injection 4 mg (has no administration in time range)    ED Course  I have reviewed the triage vital signs and the nursing notes.  Pertinent labs & imaging results that were available during my care of the patient were reviewed by me and considered in my medical decision making (see chart for details).   MDM Rules/Calculators/A&P                         Right lower quadrant abdominal pain concerning for appendicitis.  Differential diagnosis also includes diverticulitis, pancreatitis,  cholecystitis, urolithiasis, urinary tract infection, abdominal aortic aneurysm.  Old records are reviewed, and CT scan of abdomen and pelvis on 01/17/2017 did show a right renal calculus and several small gallstones.  He will be sent for CT of abdomen and pelvis.  Labs show mild renal insufficiency not significantly changed from baseline, mild anemia which is improved compared with baseline, thrombocytopenia which is unchanged from baseline.  Case is signed out to Dr. Karle Starch.  Final Clinical Impression(s) / ED Diagnoses Final diagnoses:  RLQ abdominal pain  Renal insufficiency  Normochromic normocytic anemia  Thrombocytopenia ([place])    Rx / DC Orders ED Discharge Orders     None        Delora Fuel, MD 67/20/94 [phone]

## 2020-11-06 ENCOUNTER — Emergency Department (HOSPITAL_BASED_OUTPATIENT_CLINIC_OR_DEPARTMENT_OTHER): Payer: Medicare Other

## 2020-11-06 ENCOUNTER — Other Ambulatory Visit: Payer: Self-pay

## 2020-11-06 ENCOUNTER — Inpatient Hospital Stay (HOSPITAL_BASED_OUTPATIENT_CLINIC_OR_DEPARTMENT_OTHER)
Admission: EM | Admit: 2020-11-06 | Discharge: 2020-11-09 | DRG: 660 | Disposition: A | Discharge status: Home / Self Care | Payer: Medicare Other | Attending: Internal Medicine | Admitting: Internal Medicine

## 2020-11-06 DIAGNOSIS — Z95810 Presence of automatic (implantable) cardiac defibrillator: Secondary | ICD-10-CM

## 2020-11-06 DIAGNOSIS — Z8546 Personal history of malignant neoplasm of prostate: Secondary | ICD-10-CM

## 2020-11-06 DIAGNOSIS — N179 Acute kidney failure, unspecified: Secondary | ICD-10-CM

## 2020-11-06 DIAGNOSIS — E785 Hyperlipidemia, unspecified: Secondary | ICD-10-CM | POA: Diagnosis present

## 2020-11-06 DIAGNOSIS — I2510 Atherosclerotic heart disease of native coronary artery without angina pectoris: Secondary | ICD-10-CM | POA: Diagnosis present

## 2020-11-06 DIAGNOSIS — Z20822 Contact with and (suspected) exposure to covid-19: Secondary | ICD-10-CM | POA: Diagnosis present

## 2020-11-06 DIAGNOSIS — I130 Hypertensive heart and chronic kidney disease with heart failure and stage 1 through stage 4 chronic kidney disease, or unspecified chronic kidney disease: Secondary | ICD-10-CM | POA: Diagnosis present

## 2020-11-06 DIAGNOSIS — R739 Hyperglycemia, unspecified: Secondary | ICD-10-CM | POA: Diagnosis present

## 2020-11-06 DIAGNOSIS — N131 Hydronephrosis with ureteral stricture, not elsewhere classified: Principal | ICD-10-CM | POA: Diagnosis present

## 2020-11-06 DIAGNOSIS — E538 Deficiency of other specified B group vitamins: Secondary | ICD-10-CM | POA: Diagnosis present

## 2020-11-06 DIAGNOSIS — Z79899 Other long term (current) drug therapy: Secondary | ICD-10-CM

## 2020-11-06 DIAGNOSIS — Z885 Allergy status to narcotic agent status: Secondary | ICD-10-CM

## 2020-11-06 DIAGNOSIS — Z87442 Personal history of urinary calculi: Secondary | ICD-10-CM

## 2020-11-06 DIAGNOSIS — N183 Chronic kidney disease, stage 3 unspecified: Secondary | ICD-10-CM | POA: Diagnosis present

## 2020-11-06 DIAGNOSIS — Z951 Presence of aortocoronary bypass graft: Secondary | ICD-10-CM

## 2020-11-06 DIAGNOSIS — E860 Dehydration: Secondary | ICD-10-CM | POA: Diagnosis present

## 2020-11-06 DIAGNOSIS — N1831 Chronic kidney disease, stage 3a: Secondary | ICD-10-CM | POA: Diagnosis present

## 2020-11-06 DIAGNOSIS — Z7982 Long term (current) use of aspirin: Secondary | ICD-10-CM

## 2020-11-06 DIAGNOSIS — Z8249 Family history of ischemic heart disease and other diseases of the circulatory system: Secondary | ICD-10-CM

## 2020-11-06 DIAGNOSIS — I5022 Chronic systolic (congestive) heart failure: Secondary | ICD-10-CM | POA: Diagnosis present

## 2020-11-06 DIAGNOSIS — Z87891 Personal history of nicotine dependence: Secondary | ICD-10-CM

## 2020-11-06 DIAGNOSIS — N139 Obstructive and reflux uropathy, unspecified: Secondary | ICD-10-CM

## 2020-11-06 MED ORDER — MORPHINE SULFATE (PF) 4 MG/ML IV SOLN
4.0000 mg | Freq: Once | INTRAVENOUS | Status: AC
Start: 2020-11-06 — End: 2020-11-06
  Administered 2020-11-06: 4 mg via INTRAVENOUS
  Filled 2020-11-06: qty 1

## 2020-11-06 MED ORDER — SODIUM CHLORIDE 0.9 % IV BOLUS
500.0000 mL | Freq: Once | INTRAVENOUS | Status: AC
  Administered 2020-11-06: 500 mL via INTRAVENOUS

## 2020-11-06 MED ORDER — ONDANSETRON HCL 4 MG/2ML IJ SOLN
4.0000 mg | Freq: Once | INTRAMUSCULAR | Status: AC
  Administered 2020-11-06: 4 mg via INTRAVENOUS
  Filled 2020-11-06: qty 2

## 2020-11-06 NOTE — ED Provider Notes (Signed)
[facility] EMERGENCY DEPT Provider Note   CSN: [id number] Arrival date & time: 11/06/20  2223     History Chief Complaint  Patient presents with   Flank Pain    [name] is a 87 y.o. male.  HPI     This is a 87 year old male with a history of coronary artery disease, ischemic cardiomyopathy with an EF of 45 to 50%, hypertension, hyperlipidemia, recent diagnosis of kidney stone who presents with right lower quadrant pain.  Patient was treated evaluated on 6/24.  He was found to have a 4 mm right-sided kidney stone in addition to an incidental adrenal mass.  Patient states that his pain initially was controlled at home with hydrocodone.  However, pain returned and was not helped with hydrocodone.  He reports nausea and vomiting at home as well.  Rates his pain at 8 out of 10.  Has not had any fevers.  Pain is mostly in the right lower quadrant and nonradiating.  Denies dysuria or hematuria.  Past Medical History:  Diagnosis Date   BPH (benign prostatic hypertrophy)    Chronic systolic heart failure (HCC)    Coronary artery disease    Multivessel status post CABG 8/06, LIMA-LAD, SVG-CFX, SVG-distal RCA   Essential hypertension    Hyperlipidemia    ICD (implantable cardiac defibrillator) in place    [employer]   Ischemic cardiomyopathy    Myelodysplasia, low grade ([place]) 11/27/2015   Nephrolithiasis    Prostate cancer ([place])    Vitamin B 12 deficiency 07/27/2016    Patient Active Problem List   Diagnosis Date Noted   Acute renal failure superimposed on stage 3 chronic kidney disease, unspecified acute renal failure type, unspecified whether stage 3a or 3b CKD ([place]) 11/07/2020   Gout involving toe 05/26/2019   Vitamin B 12 deficiency 07/27/2016   Myelodysplasia, low grade ([place]) 11/27/2015   Other primary cardiomyopathies 08/07/2011   Carotid stenosis 07/26/2011   Automatic implantable cardioverter-defibrillator in situ 07/26/2009   CAD, AUTOLOGOUS BYPASS  GRAFT 08/24/2008   PROSTATE CANCER 08/03/2008   Other hyperlipidemia 08/03/2008   Essential hypertension 08/03/2008   MYOCARDIAL INFARCTION, HX OF 08/03/2008   Coronary atherosclerosis 08/03/2008   CARDIOMYOPATHY, ISCHEMIC 76/28/3151   SYSTOLIC HEART FAILURE, CHRONIC 08/03/2008   NEPHROLITHIASIS, HX OF 08/03/2008   POSTSURGICAL AORTOCORONARY BYPASS STATUS 08/03/2008    Past Surgical History:  Procedure Laterality Date   CARDIAC DEFIBRILLATOR PLACEMENT     COLONOSCOPY     COLONOSCOPY N/A 08/19/2013   Procedure: COLONOSCOPY;  Surgeon: Rogene Houston, MD;  Location: [facility];  Service: Endoscopy;  Laterality: N/A;  2   CORONARY ARTERY BYPASS GRAFT     8/06 with left anterior descending artery, saphenous vein graft to circumflex, saphenous vein graft to distal right coronary artery   HERNIA REPAIR     POLYPECTOMY     PROSTATE SURGERY         Family History  Problem Relation Age of Onset   Colon cancer Father    Coronary artery disease Other     Social History   Tobacco Use   Smoking status: Former    Packs/day: 0.25    Years: 44.00    Pack years: 11.00    Types: Cigarettes    Quit date: 05/14/1992    Years since quitting: 28.5   Smokeless tobacco: Never  Vaping Use   Vaping Use: Never used  Substance Use Topics   Alcohol use: No   Drug use: No  Home Medications Prior to Admission medications   Medication Sig Start Date End Date Taking? Authorizing Provider  acetaminophen (TYLENOL) 500 MG tablet Take 1 tablet (500 mg total) by mouth every 6 (six) hours as needed for mild pain or fever. 07/14/15   Carmin Muskrat, MD  aspirin 81 MG chewable tablet Chew 81 mg by mouth daily.    [provider]  carvedilol (COREG) 6.25 MG tablet TAKE 1 TABLET(6.25 MG) BY MOUTH TWICE DAILY 09/29/20   Satira Sark, MD  cetirizine (ZYRTEC) 10 MG tablet Take 10 mg by mouth daily as needed for allergies.    [provider]  Cholecalciferol (VITAMIN D3) 1000  UNITS CAPS Take 1 capsule by mouth daily.    [provider]  cyanocobalamin (,VITAMIN B-12,) 1000 MCG/ML injection ADMINISTER 1 ML IN THE MUSCLE 1 TIME AS DIRECTED 12/31/19   Derek Jack, MD  diphenhydrAMINE (BENADRYL) 25 MG tablet Take 25 mg by mouth at bedtime as needed.     [provider]  HYDROcodone-acetaminophen (NORCO/VICODIN) 5-325 MG tablet Take 1 tablet by mouth every 6 (six) hours as needed for severe pain. 11/04/20   Truddie Hidden, MD  losartan (COZAAR) 50 MG tablet TAKE 1 TABLET(50 MG) BY MOUTH DAILY 06/13/20   Satira Sark, MD  nitroGLYCERIN (NITROSTAT) 0.4 MG SL tablet Place 1 tablet (0.4 mg total) under the tongue every 5 (five) minutes as needed. 06/08/19   Imogene Burn, PA-C  simvastatin (ZOCOR) 40 MG tablet TAKE 1 TABLET BY MOUTH EVERY DAY IN THE EVENING 06/13/20   Satira Sark, MD  spironolactone (ALDACTONE) 25 MG tablet TAKE 1/2 TABLET(12.5 MG) BY MOUTH DAILY 06/13/20   Satira Sark, MD  vitamin C (ASCORBIC ACID) 500 MG tablet Take 500 mg by mouth daily.      [provider]    Allergies    Codeine  Review of Systems   Review of Systems  Constitutional:  Negative for fever.  Respiratory:  Negative for shortness of breath.   Gastrointestinal:  Positive for abdominal pain, nausea and vomiting.  Genitourinary:  Negative for dysuria and hematuria.  All other systems reviewed and are negative.  Physical Exam Updated Vital Signs BP 135/66   Pulse 80   Temp 99 F (37.2 C) (Tympanic)   Resp 16   Ht 1.626 m (5\' 4" )   Wt 75.3 kg   SpO2 92%   BMI 28.49 kg/m   Physical Exam Vitals and nursing note reviewed.  Constitutional:      Appearance: He is well-developed.     Comments: Elderly, nontoxic-appearing  HENT:     Head: Normocephalic and atraumatic.     Mouth/Throat:     Mouth: Mucous membranes are moist.  Eyes:     Pupils: Pupils are equal, round, and reactive to light.  Cardiovascular:     Rate and  Rhythm: Normal rate and regular rhythm.     Heart sounds: Normal heart sounds. No murmur heard. Pulmonary:     Effort: Pulmonary effort is normal. No respiratory distress.     Breath sounds: Normal breath sounds. No wheezing.  Abdominal:     General: Bowel sounds are normal.     Palpations: Abdomen is soft.     Tenderness: There is no abdominal tenderness. There is no rebound.  Musculoskeletal:     Cervical back: Neck supple.  Lymphadenopathy:     Cervical: No cervical adenopathy.  Skin:    General: Skin is warm and dry.  Neurological:     Mental Status: He is alert and oriented to person, place, and time.  Psychiatric:        Mood and Affect: Mood normal.    ED Results / Procedures / Treatments   Labs (all labs ordered are listed, but only abnormal results are displayed) Labs Reviewed  BASIC METABOLIC PANEL - Abnormal; Notable for the following components:      Result Value   Glucose, Bld 197 (*)    BUN 50 (*)    Creatinine, Ser 3.55 (*)    Calcium 8.6 (*)    GFR, Estimated 16 (*)    All other components within normal limits  URINALYSIS, ROUTINE W REFLEX MICROSCOPIC - Abnormal; Notable for the following components:   Specific Gravity, Urine 1.031 (*)    Protein, ur 100 (*)    Leukocytes,Ua LARGE (*)    All other components within normal limits  CBC WITH DIFFERENTIAL/PLATELET - Abnormal; Notable for the following components:   RBC 3.67 (*)    Hemoglobin 11.5 (*)    HCT 34.2 (*)    Platelets 80 (*)    Monocytes Absolute 2.5 (*)    All other components within normal limits  RESP PANEL BY RT-PCR (FLU A&B, COVID) [id number]  URINE CULTURE    EKG None  Radiology DG Abdomen 1 View  Result Date: 11/07/2020 CLINICAL DATA:  Abdominal pain.  History of recent right stone. EXAM: ABDOMEN - 1 VIEW COMPARISON:  CT 11/04/2020 FINDINGS: Contrast material is noted within the mid to distal right ureter from prior contrast enhanced CT study. Within the right lower pelvis, there is a  small density seen which likely reflects the previously seen distal right ureteral stone. Nonobstructive bowel gas pattern. Moderate stool throughout the colon. No organomegaly. IMPRESSION: Probable continued small distal right ureteral stone. Contrast material seen within the mid to distal right ureter leading to this stone. Electronically Signed   By: Rolm Baptise M.D.   On: 11/07/2020 00:06    Procedures Procedures   Medications Ordered in ED Medications  cefTRIAXone (ROCEPHIN) 1 g in sodium chloride 0.9 % 100 mL IVPB (1 g Intravenous New Bag/Given 11/07/20 0105)  sodium chloride 0.9 % bolus 500 mL (0 mLs Intravenous Stopped 11/07/20 0016)  morphine 4 MG/ML injection 4 mg (4 mg Intravenous Given 11/06/20 2337)  ondansetron (ZOFRAN) injection 4 mg (4 mg Intravenous Given 11/06/20 2336)    ED Course  I have reviewed the triage vital signs and the nursing notes.  Pertinent labs & imaging results that were available during my care of the patient were reviewed by me and considered in my medical decision making (see chart for details).  Clinical Course as of 11/07/20 0113  Mon Nov 07, 2020  0112 Spoke with Dr. Tresa Moore, urology.  Indicates that the patient remained stable and without evidence of infection or sepsis, would likely be evaluated for stent first thing in the morning.  We will plan for admit to hospitalist. [CH]    Clinical Course User Index [CH] Billijo Dilling, Barbette Hair, MD   MDM Rules/Calculators/A&P                          Patient presents with refractory right-sided pain.  Was diagnosed with kidney stone 3 days ago.  She was on Norco at home with minimal relief.  He is nontoxic and vital signs are reassuring.  He is afebrile.  KUB and basic lab work was sent.  Patient has a history of ischemic cardiomyopathy with an EF of 45%.  He was given 500 cc bolus and tolerated this well.  Work-up notable for acute kidney injury with creatinine of 3.55 up from 1.5.  His urinalysis does have some  leukocyte esterase today as well as white cells but no bacteria.  He is not overtly septic or ill-appearing.  We will culture.  KUB shows persistent distal ureteral stone.  Suspect creatinine is related to obstructive uropathy.  Patient is able to urinate on his own.  He will need admission to the hospital.  See clinical course above.  Given likelihood of instrumentation and stent placement, patient was given 1 g of Rocephin to cover for early UTI given urinalysis after discussion with the hospitalist.  We will plan for admission to [facility] long hospital. Final Clinical Impression(s) / ED Diagnoses Final diagnoses:  Obstructive uropathy  AKI (acute kidney injury) [facility])    Rx / [place] Orders ED Discharge Orders     None        Merryl Hacker, MD 11/07/20 [phone]

## 2020-11-06 NOTE — ED Triage Notes (Addendum)
Reports was seen at [facility] dx with kidney stone.  Reports pain is still bad and took the medication given but has not helped.  He was prescribed hydrocodone but no flomax.

## 2020-11-07 ENCOUNTER — Inpatient Hospital Stay: Admit: 2020-11-07 | Payer: Medicare Other | Admitting: Urology

## 2020-11-07 ENCOUNTER — Inpatient Hospital Stay (HOSPITAL_COMMUNITY): Payer: Medicare Other | Admitting: Anesthesiology

## 2020-11-07 ENCOUNTER — Encounter (HOSPITAL_COMMUNITY): Payer: Self-pay | Admitting: Internal Medicine

## 2020-11-07 ENCOUNTER — Encounter (HOSPITAL_COMMUNITY)
Admission: EM | Disposition: A | Discharge status: Home / Self Care | Payer: Self-pay | Source: Home / Self Care | Attending: Family Medicine

## 2020-11-07 ENCOUNTER — Inpatient Hospital Stay (HOSPITAL_COMMUNITY): Payer: Medicare Other

## 2020-11-07 ENCOUNTER — Emergency Department (HOSPITAL_BASED_OUTPATIENT_CLINIC_OR_DEPARTMENT_OTHER): Payer: Medicare Other

## 2020-11-07 DIAGNOSIS — E785 Hyperlipidemia, unspecified: Secondary | ICD-10-CM | POA: Diagnosis not present

## 2020-11-07 DIAGNOSIS — D631 Anemia in chronic kidney disease: Secondary | ICD-10-CM | POA: Diagnosis not present

## 2020-11-07 DIAGNOSIS — Z951 Presence of aortocoronary bypass graft: Secondary | ICD-10-CM | POA: Diagnosis not present

## 2020-11-07 DIAGNOSIS — I5022 Chronic systolic (congestive) heart failure: Secondary | ICD-10-CM | POA: Diagnosis present

## 2020-11-07 DIAGNOSIS — Z8546 Personal history of malignant neoplasm of prostate: Secondary | ICD-10-CM | POA: Diagnosis not present

## 2020-11-07 DIAGNOSIS — Z885 Allergy status to narcotic agent status: Secondary | ICD-10-CM | POA: Diagnosis not present

## 2020-11-07 DIAGNOSIS — Z87891 Personal history of nicotine dependence: Secondary | ICD-10-CM | POA: Diagnosis not present

## 2020-11-07 DIAGNOSIS — N183 Chronic kidney disease, stage 3 unspecified: Secondary | ICD-10-CM | POA: Diagnosis not present

## 2020-11-07 DIAGNOSIS — I2510 Atherosclerotic heart disease of native coronary artery without angina pectoris: Secondary | ICD-10-CM | POA: Diagnosis present

## 2020-11-07 DIAGNOSIS — Z79899 Other long term (current) drug therapy: Secondary | ICD-10-CM | POA: Diagnosis not present

## 2020-11-07 DIAGNOSIS — R109 Unspecified abdominal pain: Secondary | ICD-10-CM | POA: Diagnosis not present

## 2020-11-07 DIAGNOSIS — Z8249 Family history of ischemic heart disease and other diseases of the circulatory system: Secondary | ICD-10-CM | POA: Diagnosis not present

## 2020-11-07 DIAGNOSIS — E538 Deficiency of other specified B group vitamins: Secondary | ICD-10-CM | POA: Diagnosis not present

## 2020-11-07 DIAGNOSIS — E860 Dehydration: Secondary | ICD-10-CM | POA: Diagnosis present

## 2020-11-07 DIAGNOSIS — I130 Hypertensive heart and chronic kidney disease with heart failure and stage 1 through stage 4 chronic kidney disease, or unspecified chronic kidney disease: Secondary | ICD-10-CM | POA: Diagnosis present

## 2020-11-07 DIAGNOSIS — N1831 Chronic kidney disease, stage 3a: Secondary | ICD-10-CM | POA: Diagnosis not present

## 2020-11-07 DIAGNOSIS — N179 Acute kidney failure, unspecified: Secondary | ICD-10-CM | POA: Diagnosis not present

## 2020-11-07 DIAGNOSIS — Z95810 Presence of automatic (implantable) cardiac defibrillator: Secondary | ICD-10-CM | POA: Diagnosis not present

## 2020-11-07 DIAGNOSIS — N202 Calculus of kidney with calculus of ureter: Secondary | ICD-10-CM | POA: Diagnosis not present

## 2020-11-07 DIAGNOSIS — Z20822 Contact with and (suspected) exposure to covid-19: Secondary | ICD-10-CM | POA: Diagnosis not present

## 2020-11-07 DIAGNOSIS — N139 Obstructive and reflux uropathy, unspecified: Secondary | ICD-10-CM | POA: Diagnosis not present

## 2020-11-07 DIAGNOSIS — N132 Hydronephrosis with renal and ureteral calculous obstruction: Secondary | ICD-10-CM | POA: Diagnosis not present

## 2020-11-07 DIAGNOSIS — R739 Hyperglycemia, unspecified: Secondary | ICD-10-CM | POA: Diagnosis present

## 2020-11-07 DIAGNOSIS — Z7982 Long term (current) use of aspirin: Secondary | ICD-10-CM | POA: Diagnosis not present

## 2020-11-07 DIAGNOSIS — Z87442 Personal history of urinary calculi: Secondary | ICD-10-CM | POA: Diagnosis not present

## 2020-11-07 DIAGNOSIS — N131 Hydronephrosis with ureteral stricture, not elsewhere classified: Secondary | ICD-10-CM | POA: Diagnosis not present

## 2020-11-07 HISTORY — PX: CYSTOSCOPY/URETEROSCOPY/HOLMIUM LASER/STENT PLACEMENT: SHX6546

## 2020-11-07 LAB — CBC WITH DIFFERENTIAL/PLATELET
Abs Immature Granulocytes: 0.04 10*3/uL (ref 0.00–0.07)
Abs Immature Granulocytes: 0.05 10*3/uL (ref 0.00–0.07)
Basophils Absolute: 0 10*3/uL (ref 0.0–0.1)
Basophils Absolute: 0 10*3/uL (ref 0.0–0.1)
Basophils Relative: 0 %
Basophils Relative: 0 %
Eosinophils Absolute: 0 10*3/uL (ref 0.0–0.5)
Eosinophils Absolute: 0 10*3/uL (ref 0.0–0.5)
Eosinophils Relative: 0 %
Eosinophils Relative: 0 %
HCT: 34.2 % — ABNORMAL LOW (ref 39.0–52.0)
HCT: 35.1 % — ABNORMAL LOW (ref 39.0–52.0)
Hemoglobin: 11.5 g/dL — ABNORMAL LOW (ref 13.0–17.0)
Hemoglobin: 11.5 g/dL — ABNORMAL LOW (ref 13.0–17.0)
Immature Granulocytes: 1 %
Immature Granulocytes: 1 %
Lymphocytes Relative: 12 %
Lymphocytes Relative: 9 %
Lymphs Abs: 0.7 10*3/uL (ref 0.7–4.0)
Lymphs Abs: 0.6 10*3/uL — ABNORMAL LOW (ref 0.7–4.0)
MCH: 31.3 pg (ref 26.0–34.0)
MCH: 31.5 pg (ref 26.0–34.0)
MCHC: 33.6 g/dL (ref 30.0–36.0)
MCHC: 32.8 g/dL (ref 30.0–36.0)
MCV: 93.2 fL (ref 80.0–100.0)
MCV: 96.2 fL (ref 80.0–100.0)
Monocytes Absolute: 2.5 10*3/uL — ABNORMAL HIGH (ref 0.1–1.0)
Monocytes Absolute: 3.6 10*3/uL — ABNORMAL HIGH (ref 0.1–1.0)
Monocytes Relative: 45 %
Monocytes Relative: 48 %
Neutro Abs: 2.3 10*3/uL (ref 1.7–7.7)
Neutro Abs: 3.1 10*3/uL (ref 1.7–7.7)
Neutrophils Relative %: 42 %
Neutrophils Relative %: 42 %
Platelets: 80 10*3/uL — ABNORMAL LOW (ref 150–400)
Platelets: 76 10*3/uL — ABNORMAL LOW (ref 150–400)
RBC: 3.67 MIL/uL — ABNORMAL LOW (ref 4.22–5.81)
RBC: 3.65 MIL/uL — ABNORMAL LOW (ref 4.22–5.81)
RDW: 13.1 % (ref 11.5–15.5)
RDW: 13.1 % (ref 11.5–15.5)
WBC: 5.5 10*3/uL (ref 4.0–10.5)
WBC: 7.4 10*3/uL (ref 4.0–10.5)
nRBC: 0 % (ref 0.0–0.2)
nRBC: 0 % (ref 0.0–0.2)

## 2020-11-07 LAB — COMPREHENSIVE METABOLIC PANEL
ALT: 21 U/L (ref 0–44)
AST: 18 U/L (ref 15–41)
Albumin: 3.6 g/dL (ref 3.5–5.0)
Alkaline Phosphatase: 41 U/L (ref 38–126)
Anion gap: 7 (ref 5–15)
BUN: 51 mg/dL — ABNORMAL HIGH (ref 8–23)
CO2: 26 mmol/L (ref 22–32)
Calcium: 8.2 mg/dL — ABNORMAL LOW (ref 8.9–10.3)
Chloride: 104 mmol/L (ref 98–111)
Creatinine, Ser: 3.43 mg/dL — ABNORMAL HIGH (ref 0.61–1.24)
GFR, Estimated: 16 mL/min — ABNORMAL LOW (ref >60)
Glucose, Bld: 149 mg/dL — ABNORMAL HIGH (ref 70–99)
Potassium: 5.1 mmol/L (ref 3.5–5.1)
Sodium: 137 mmol/L (ref 135–145)
Total Bilirubin: 0.9 mg/dL (ref 0.3–1.2)
Total Protein: 6.4 g/dL — ABNORMAL LOW (ref 6.5–8.1)

## 2020-11-07 LAB — URINALYSIS, ROUTINE W REFLEX MICROSCOPIC
Bilirubin Urine: NEGATIVE
Glucose, UA: NEGATIVE mg/dL
Hgb urine dipstick: NEGATIVE
Ketones, ur: NEGATIVE mg/dL
Nitrite: NEGATIVE
Protein, ur: 100 mg/dL — AB
Specific Gravity, Urine: 1.031 — ABNORMAL HIGH (ref 1.005–1.030)
pH: 5.5 (ref 5.0–8.0)

## 2020-11-07 LAB — BASIC METABOLIC PANEL
Anion gap: 9 (ref 5–15)
BUN: 50 mg/dL — ABNORMAL HIGH (ref 8–23)
CO2: 25 mmol/L (ref 22–32)
Calcium: 8.6 mg/dL — ABNORMAL LOW (ref 8.9–10.3)
Chloride: 102 mmol/L (ref 98–111)
Creatinine, Ser: 3.55 mg/dL — ABNORMAL HIGH (ref 0.61–1.24)
GFR, Estimated: 16 mL/min — ABNORMAL LOW (ref >60)
Glucose, Bld: 197 mg/dL — ABNORMAL HIGH (ref 70–99)
Potassium: 4.8 mmol/L (ref 3.5–5.1)
Sodium: 136 mmol/L (ref 135–145)

## 2020-11-07 LAB — RESP PANEL BY RT-PCR (FLU A&B, COVID) ARPGX2
Influenza A by PCR: NEGATIVE
Influenza B by PCR: NEGATIVE
SARS Coronavirus 2 by RT PCR: NEGATIVE

## 2020-11-07 LAB — MAGNESIUM: Magnesium: 1.8 mg/dL (ref 1.7–2.4)

## 2020-11-07 SURGERY — CYSTOSCOPY/URETEROSCOPY/HOLMIUM LASER/STENT PLACEMENT
Anesthesia: General | Laterality: Right

## 2020-11-07 MED ORDER — HYDRALAZINE HCL 20 MG/ML IJ SOLN: 10.0000 mg | Freq: Three times a day (TID) | INTRAMUSCULAR | Status: DC | PRN

## 2020-11-07 MED ORDER — FENTANYL CITRATE (PF) 100 MCG/2ML IJ SOLN: 25.0000 ug | INTRAMUSCULAR | Status: DC | PRN

## 2020-11-07 MED ORDER — IOHEXOL 300 MG/ML  SOLN
INTRAMUSCULAR | Status: DC | PRN
  Administered 2020-11-07: 10 mL

## 2020-11-07 MED ORDER — ACETAMINOPHEN 500 MG PO TABS
1000.0000 mg | ORAL_TABLET | Freq: Once | ORAL | Status: AC
  Administered 2020-11-07: 1000 mg via ORAL
  Filled 2020-11-07: qty 2

## 2020-11-07 MED ORDER — EPHEDRINE SULFATE-NACL 50-0.9 MG/10ML-% IV SOSY
PREFILLED_SYRINGE | INTRAVENOUS | Status: DC | PRN
  Administered 2020-11-07 (×3): 10 mg via INTRAVENOUS

## 2020-11-07 MED ORDER — CARVEDILOL 6.25 MG PO TABS
6.2500 mg | ORAL_TABLET | Freq: Once | ORAL | Status: AC
  Administered 2020-11-07: 6.25 mg via ORAL
  Filled 2020-11-07: qty 1

## 2020-11-07 MED ORDER — SODIUM CHLORIDE 0.9 % IV SOLN
1.0000 g | Freq: Once | INTRAVENOUS | Status: AC
  Administered 2020-11-07: 1 g via INTRAVENOUS

## 2020-11-07 MED ORDER — DEXAMETHASONE SODIUM PHOSPHATE 10 MG/ML IJ SOLN
INTRAMUSCULAR | Status: AC
  Filled 2020-11-07: qty 1

## 2020-11-07 MED ORDER — ALBUTEROL SULFATE (2.5 MG/3ML) 0.083% IN NEBU
INHALATION_SOLUTION | RESPIRATORY_TRACT | Status: AC
  Administered 2020-11-07: 2.5 mg via RESPIRATORY_TRACT
  Filled 2020-11-07: qty 3

## 2020-11-07 MED ORDER — SODIUM CHLORIDE 0.9 % IV SOLN: INTRAVENOUS | Status: DC

## 2020-11-07 MED ORDER — PHENYLEPHRINE HCL (PRESSORS) 10 MG/ML IV SOLN
INTRAVENOUS | Status: AC
  Filled 2020-11-07: qty 1

## 2020-11-07 MED ORDER — DEXAMETHASONE SODIUM PHOSPHATE 10 MG/ML IJ SOLN
INTRAMUSCULAR | Status: DC | PRN
  Administered 2020-11-07: 4 mg via INTRAVENOUS

## 2020-11-07 MED ORDER — SODIUM CHLORIDE 0.9 % IR SOLN
Status: DC | PRN
  Administered 2020-11-07: 3000 mL

## 2020-11-07 MED ORDER — MORPHINE SULFATE (PF) 2 MG/ML IV SOLN
2.0000 mg | INTRAVENOUS | Status: DC | PRN
  Administered 2020-11-09: 2 mg via INTRAVENOUS
  Filled 2020-11-07: qty 1

## 2020-11-07 MED ORDER — LIDOCAINE 2% (20 MG/ML) 5 ML SYRINGE
INTRAMUSCULAR | Status: DC | PRN
  Administered 2020-11-07: 80 mg via INTRAVENOUS

## 2020-11-07 MED ORDER — LIDOCAINE 2% (20 MG/ML) 5 ML SYRINGE
INTRAMUSCULAR | Status: AC
  Filled 2020-11-07: qty 5

## 2020-11-07 MED ORDER — PROPOFOL 10 MG/ML IV BOLUS
INTRAVENOUS | Status: AC
  Filled 2020-11-07: qty 20

## 2020-11-07 MED ORDER — LACTATED RINGERS IV SOLN: INTRAVENOUS | Status: DC

## 2020-11-07 MED ORDER — PHENYLEPHRINE 40 MCG/ML (10ML) SYRINGE FOR IV PUSH (FOR BLOOD PRESSURE SUPPORT)
PREFILLED_SYRINGE | INTRAVENOUS | Status: DC | PRN
  Administered 2020-11-07: 120 ug via INTRAVENOUS
  Administered 2020-11-07: 40 ug via INTRAVENOUS
  Administered 2020-11-07 (×2): 120 ug via INTRAVENOUS

## 2020-11-07 MED ORDER — EPHEDRINE 5 MG/ML INJ
INTRAVENOUS | Status: AC
  Filled 2020-11-07: qty 10

## 2020-11-07 MED ORDER — PHENYLEPHRINE HCL-NACL 10-0.9 MG/250ML-% IV SOLN
INTRAVENOUS | Status: DC | PRN
  Administered 2020-11-07: 40 ug/min via INTRAVENOUS

## 2020-11-07 MED ORDER — ORAL CARE MOUTH RINSE: 15.0000 mL | Freq: Once | OROMUCOSAL | Status: AC

## 2020-11-07 MED ORDER — ONDANSETRON HCL 4 MG PO TABS: 4.0000 mg | ORAL_TABLET | Freq: Four times a day (QID) | ORAL | Status: DC | PRN

## 2020-11-07 MED ORDER — PROPOFOL 10 MG/ML IV BOLUS
INTRAVENOUS | Status: DC | PRN
  Administered 2020-11-07: 160 mg via INTRAVENOUS

## 2020-11-07 MED ORDER — ONDANSETRON HCL 4 MG/2ML IJ SOLN
4.0000 mg | Freq: Four times a day (QID) | INTRAMUSCULAR | Status: DC | PRN
  Administered 2020-11-07: 4 mg via INTRAVENOUS

## 2020-11-07 MED ORDER — ONDANSETRON HCL 4 MG/2ML IJ SOLN
INTRAMUSCULAR | Status: AC
  Filled 2020-11-07: qty 2

## 2020-11-07 MED ORDER — ALBUTEROL SULFATE (2.5 MG/3ML) 0.083% IN NEBU: 2.5000 mg | INHALATION_SOLUTION | Freq: Once | RESPIRATORY_TRACT | Status: AC

## 2020-11-07 MED ORDER — CHLORHEXIDINE GLUCONATE 0.12 % MT SOLN
15.0000 mL | Freq: Once | OROMUCOSAL | Status: AC
  Administered 2020-11-07: 15 mL via OROMUCOSAL

## 2020-11-07 MED ORDER — ONDANSETRON HCL 4 MG/2ML IJ SOLN: 4.0000 mg | Freq: Once | INTRAMUSCULAR | Status: DC | PRN

## 2020-11-07 SURGICAL SUPPLY — 23 items
BAG URO CATCHER STRL LF (MISCELLANEOUS) ×3 IMPLANT
BASKET LASER NITINOL 1.9FR (BASKET) IMPLANT
BSKT STON RTRVL 120 1.9FR (BASKET)
CATH INTERMIT  6FR 70CM (CATHETERS) ×3 IMPLANT
CLOTH BEACON ORANGE TIMEOUT ST (SAFETY) ×3 IMPLANT
EXTRACTOR STONE 1.7FRX115CM (UROLOGICAL SUPPLIES) IMPLANT
GLOVE SURG ENC TEXT LTX SZ7.5 (GLOVE) ×3 IMPLANT
GOWN STRL REUS W/TWL LRG LVL3 (GOWN DISPOSABLE) ×3 IMPLANT
GUIDEWIRE ANG ZIPWIRE 038X150 (WIRE) ×3 IMPLANT
GUIDEWIRE STR DUAL SENSOR (WIRE) ×3 IMPLANT
KIT TURNOVER KIT A (KITS) ×3 IMPLANT
LASER FIB FLEXIVA PULSE ID 365 (Laser) IMPLANT
MANIFOLD NEPTUNE II (INSTRUMENTS) ×3 IMPLANT
PACK CYSTO (CUSTOM PROCEDURE TRAY) ×3 IMPLANT
SHEATH URETERAL 12FRX28CM (UROLOGICAL SUPPLIES) IMPLANT
SHEATH URETERAL 12FRX35CM (MISCELLANEOUS) IMPLANT
STENT POLARIS 5FRX26 (STENTS) ×2 IMPLANT
TRACTIP FLEXIVA PULS ID 200XHI (Laser) IMPLANT
TRACTIP FLEXIVA PULSE ID 200 (Laser) ×3
TUBE FEEDING 8FR 16IN STR KANG (MISCELLANEOUS) ×3 IMPLANT
TUBING CONNECTING 10 (TUBING) ×2 IMPLANT
TUBING CONNECTING 10' (TUBING) ×1
TUBING UROLOGY SET (TUBING) ×3 IMPLANT

## 2020-11-07 NOTE — ED Notes (Signed)
[facility] at bedside 

## 2020-11-07 NOTE — Transfer of Care (Signed)
Immediate Anesthesia Transfer of Care Note  Patient: [name]  Procedure(s) Performed: CYSTOSCOPY/URETEROSCOPY/HOLMIUM LASER/STENT PLACEMENT (Right)  Patient Location: PACU  Anesthesia Type:General  Level of Consciousness: awake  Airway & Oxygen Therapy: Patient Spontanous Breathing and Patient connected to face mask oxygen  Post-op Assessment: Report given to RN and Post -op Vital signs reviewed and stable  Post vital signs: Reviewed and stable  Last Vitals:  Vitals Value Taken Time  BP 123/58 11/07/20 1749  Temp    Pulse 74 11/07/20 1753  Resp 16 11/07/20 1753  SpO2 100 % 11/07/20 1753  Vitals shown include unvalidated device data.  Last Pain:  Vitals:   11/07/20 1542  TempSrc: Oral  PainSc:          Complications: No notable events documented.

## 2020-11-07 NOTE — Brief Op Note (Signed)
11/06/2020 - 11/07/2020  5:35 PM  PATIENT:  [name]  87 y.o. male  PRE-OPERATIVE DIAGNOSIS:  RIGHT URETERAL STONE, ACUTE RENAL FAIILURE  POST-OPERATIVE DIAGNOSIS:  RIGHT URETERAL STONE, ACUTE RENAL FAIILURE  PROCEDURE:  Procedure(s): CYSTOSCOPY/URETEROSCOPY/HOLMIUM LASER/STENT PLACEMENT (Right)  SURGEON:  Surgeon(s) and Role:    * Alexis Frock, MD - Primary  PHYSICIAN ASSISTANT:   ASSISTANTS: none   ANESTHESIA:   general  EBL:  minimal   BLOOD ADMINISTERED:none  DRAINS: none   LOCAL MEDICATIONS USED:  NONE  SPECIMEN:  Source of Specimen:  Rt ureteral stone fragments  DISPOSITION OF SPECIMEN:   [facility]  COUNTS:  YES  TOURNIQUET:  * No tourniquets in log *  DICTATION: .Other Dictation: Dictation Number [id number]  PLAN OF CARE: Admit for overnight observation  PATIENT DISPOSITION:  PACU - hemodynamically stable.   Delay start of Pharmacological VTE agent (>24hrs) due to surgical blood loss or risk of bleeding: not applicable

## 2020-11-07 NOTE — Anesthesia Postprocedure Evaluation (Signed)
Anesthesia Post Note  Patient: [name]  Procedure(s) Performed: CYSTOSCOPY/URETEROSCOPY/HOLMIUM LASER/STENT PLACEMENT (Right)     Patient location during evaluation: PACU Anesthesia Type: General Level of consciousness: awake Pain management: pain level controlled Vital Signs Assessment: post-procedure vital signs reviewed and stable Respiratory status: spontaneous breathing Cardiovascular status: stable Postop Assessment: no apparent nausea or vomiting Anesthetic complications: no   No notable events documented.  Last Vitals:  Vitals:   11/07/20 1800 11/07/20 1815  BP: 125/61 (!) 120/57  Pulse: 88 74  Resp: 17 18  Temp: 36.7 C   SpO2: 100% 93%    Last Pain:  Vitals:   11/07/20 1815  TempSrc:   PainSc: 0-No pain                 Piercen Covino

## 2020-11-07 NOTE — Anesthesia Procedure Notes (Signed)
Procedure Name: LMA Insertion Date/Time: 11/07/2020 5:08 PM Performed by: Milford Cage, CRNA Pre-anesthesia Checklist: Patient identified, Emergency Drugs available, Suction available and Patient being monitored Patient Re-evaluated:Patient Re-evaluated prior to induction Oxygen Delivery Method: Circle System Utilized Preoxygenation: Pre-oxygenation with 100% oxygen Induction Type: IV induction Ventilation: Mask ventilation without difficulty LMA: LMA inserted LMA Size: 4.0 Number of attempts: 1 Airway Equipment and Method: Bite block Placement Confirmation: positive ETCO2 Tube secured with: Tape Dental Injury: Teeth and Oropharynx as per pre-operative assessment

## 2020-11-07 NOTE — Consult Note (Signed)
Reason for Consult: RIGHT Distal Ureteral Stone, Acute Renal Failure, Left Adrenal Mass, Prostate Cancer  Referring Physician: Thayer Jew MD  [name] is an 87 y.o. male.   HPI:   1 - RIGHT Distal Ureteral Stone - 40mm Rt distal stone by ER CT 11/04/20. UA x 2 without infectious parmaters. WBC 5.5. Given trial of medical therapy but refracotry colic prompting repeat ER eval 6/26.  2 -  Acute Renal Failure - baseline Cr 1.3 with rise to 3.5 by ER labs 6/26. Unilateral Rt ureteral obsturction, some poor PO intake, and received recent IV contrast few days prior.  3 - Left Adrenal Mass - 5cm heterogeneous incidental left adrenal mass by CT 10/2020. No severe hypokalemia or refracotry hypertension.  4 -  Prostate Cancer - s/p primary cryoablation in early 2000's by Dr. Rosana Hoes for unknown grade/stage disease. PSA 0.17 2015. (Met criteria for cure)  PMH sig for CAD/CABG/AICD (not limiting day to day). He is retired from [employer] and service in [place], was in [employer] prior.   Today "[name]" is seen for semi-urgetn evaluation of above. He has had some low grade (99) temp today w/o localizing symptoms. He received Rocephin in there early AM.   Past Medical History:  Diagnosis Date   BPH (benign prostatic hypertrophy)    Chronic systolic heart failure (HCC)    Coronary artery disease    Multivessel status post CABG 8/06, LIMA-LAD, SVG-CFX, SVG-distal RCA   Essential hypertension    Hyperlipidemia    ICD (implantable cardiac defibrillator) in place    Medtronic   Ischemic cardiomyopathy    Myelodysplasia, low grade ([place]) 11/27/2015   Nephrolithiasis    Prostate cancer ([place])    Vitamin B 12 deficiency 07/27/2016    Past Surgical History:  Procedure Laterality Date   CARDIAC DEFIBRILLATOR PLACEMENT     COLONOSCOPY     COLONOSCOPY N/A 08/19/2013   Procedure: COLONOSCOPY;  Surgeon: Rogene Houston, MD;  [facility];  Service: Endoscopy;  Laterality: N/A;  71    CORONARY ARTERY BYPASS GRAFT     8/06 with left anterior descending artery, saphenous vein graft to circumflex, saphenous vein graft to distal right coronary artery   HERNIA REPAIR     POLYPECTOMY     PROSTATE SURGERY      Family History  Problem Relation Age of Onset   Colon cancer Father    Coronary artery disease Other     Social History:  reports that he quit smoking about 28 years ago. His smoking use included cigarettes. He has a 11.00 pack-year smoking history. He has never used smokeless tobacco. He reports that he does not drink alcohol and does not use drugs.  Allergies:  Allergies  Allergen Reactions   Codeine Nausea And Vomiting    Medications: I have reviewed the patient's current medications.  Results for orders placed or performed during the hospital encounter of 11/06/20 (from the past 48 hour(s))  Basic metabolic panel     Status: Abnormal   Collection Time: 11/06/20 11:10 PM  Result Value Ref Range   Sodium 136 135 - 145 mmol/L   Potassium 4.8 3.5 - 5.1 mmol/L   Chloride 102 98 - 111 mmol/L   CO2 25 22 - 32 mmol/L   Glucose, Bld 197 (H) 70 - 99 mg/dL    Comment: Glucose reference range applies only to samples taken after fasting for at least 8 hours.   BUN 50 (H) 8 - 23 mg/dL  Creatinine, Ser 3.55 (H) 0.61 - 1.24 mg/dL   Calcium 8.6 (L) 8.9 - 10.3 mg/dL   GFR, Estimated 16 (L) >60 mL/min    Comment: (NOTE) Calculated using the CKD-EPI Creatinine Equation (2021)    Anion gap 9 5 - 15    Comment: Performed at [facility], [address] [place], [place] [address]  CBC with Differential     Status: Abnormal   Collection Time: 11/06/20 11:10 PM  Result Value Ref Range   WBC 5.5 4.0 - 10.5 K/uL   RBC 3.67 (L) 4.22 - 5.81 MIL/uL   Hemoglobin 11.5 (L) 13.0 - 17.0 g/dL   HCT 34.2 (L) 39.0 - 52.0 %   MCV 93.2 80.0 - 100.0 fL   MCH 31.3 26.0 - 34.0 pg   MCHC 33.6 30.0 - 36.0 g/dL   RDW 13.1 11.5 - 15.5 %   Platelets 80 (L) 150 - 400  K/uL    Comment: SPECIMEN CHECKED FOR CLOTS Immature Platelet Fraction may be clinically indicated, consider ordering this additional test [id number] REPEATED TO VERIFY    nRBC 0.0 0.0 - 0.2 %   Neutrophils Relative % 42 %   Neutro Abs 2.3 1.7 - 7.7 K/uL   Lymphocytes Relative 12 %   Lymphs Abs 0.7 0.7 - 4.0 K/uL   Monocytes Relative 45 %   Monocytes Absolute 2.5 (H) 0.1 - 1.0 K/uL   Eosinophils Relative 0 %   Eosinophils Absolute 0.0 0.0 - 0.5 K/uL   Basophils Relative 0 %   Basophils Absolute 0.0 0.0 - 0.1 K/uL   Immature Granulocytes 1 %   Abs Immature Granulocytes 0.04 0.00 - 0.07 K/uL    Comment: Performed at [facility], [place], [place] [address]  Urinalysis, Routine w reflex microscopic Urine, Clean Catch     Status: Abnormal   Collection Time: 11/07/20 12:00 AM  Result Value Ref Range   Color, Urine YELLOW YELLOW   APPearance CLEAR CLEAR   Specific Gravity, Urine 1.031 (H) 1.005 - 1.030   pH 5.5 5.0 - 8.0   Glucose, UA NEGATIVE NEGATIVE mg/dL   Hgb urine dipstick NEGATIVE NEGATIVE   Bilirubin Urine NEGATIVE NEGATIVE   Ketones, ur NEGATIVE NEGATIVE mg/dL   Protein, ur 100 (A) NEGATIVE mg/dL   Nitrite NEGATIVE NEGATIVE   Leukocytes,Ua LARGE (A) NEGATIVE   RBC / HPF 6-10 0 - 5 RBC/hpf   WBC, UA 21-50 0 - 5 WBC/hpf    Comment: Performed at [facility], [address], [place], [place] [address]  Resp Panel by RT-PCR (Flu A&B, Covid) Urine, Clean Catch     Status: None   Collection Time: 11/07/20 12:23 AM   Specimen: Urine, Clean Catch; Nasopharyngeal(NP) swabs in vial transport medium  Result Value Ref Range   SARS Coronavirus 2 by RT PCR NEGATIVE NEGATIVE    Comment: (NOTE) SARS-CoV-2 target nucleic acids are NOT DETECTED.  The SARS-CoV-2 RNA is generally detectable in upper respiratory specimens during the acute phase of infection. The lowest concentration of SARS-CoV-2 viral copies this assay can detect  is 138 copies/mL. A negative result does not preclude SARS-Cov-2 infection and should not be used as the sole basis for treatment or other patient management decisions. A negative result may occur with  improper specimen collection/handling, submission of specimen other than nasopharyngeal swab, presence of viral mutation(s) within the areas targeted by this assay, and inadequate number of viral copies(<138 copies/mL). A negative result must be combined with  clinical observations, patient history, and epidemiological information. The expected result is Negative.  Fact Sheet for Patients:  EntrepreneurPulse.com.au  Fact Sheet for Healthcare Providers:  IncredibleEmployment.be  This test is no t yet approved or cleared by the [place] FDA and  has been authorized for detection and/or diagnosis of SARS-CoV-2 by FDA under an Emergency Use Authorization (EUA). This EUA will remain  in effect (meaning this test can be used) for the duration of the COVID-19 declaration under Section 564(b)(1) of the Act, 21 U.S.C.section 360bbb-3(b)(1), unless the authorization is terminated  or revoked sooner.       Influenza A by PCR NEGATIVE NEGATIVE   Influenza B by PCR NEGATIVE NEGATIVE    Comment: (NOTE) The Xpert Xpress SARS-CoV-2/FLU/RSV plus assay is intended as an aid in the diagnosis of influenza from Nasopharyngeal swab specimens and should not be used as a sole basis for treatment. Nasal washings and aspirates are unacceptable for Xpert Xpress SARS-CoV-2/FLU/RSV testing.  Fact Sheet for Patients: EntrepreneurPulse.com.au  Fact Sheet for Healthcare Providers: IncredibleEmployment.be  This test is not yet approved or cleared by the [place] FDA and has been authorized for detection and/or diagnosis of SARS-CoV-2 by FDA under an Emergency Use Authorization (EUA). This EUA will remain in effect (meaning this  test can be used) for the duration of the COVID-19 declaration under Section 564(b)(1) of the Act, 21 U.S.C. section 360bbb-3(b)(1), unless the authorization is terminated or revoked.  Performed at [facility], [address] [place], [place] [address]     DG Abdomen 1 View  Result Date: 11/07/2020 CLINICAL DATA:  Abdominal pain.  History of recent right stone. EXAM: ABDOMEN - 1 VIEW COMPARISON:  CT 11/04/2020 FINDINGS: Contrast material is noted within the mid to distal right ureter from prior contrast enhanced CT study. Within the right lower pelvis, there is a small density seen which likely reflects the previously seen distal right ureteral stone. Nonobstructive bowel gas pattern. Moderate stool throughout the colon. No organomegaly. IMPRESSION: Probable continued small distal right ureteral stone. Contrast material seen within the mid to distal right ureter leading to this stone. Electronically Signed   By: Rolm Baptise M.D.   On: 11/07/2020 00:06    Review of Systems  Constitutional: Negative.  Negative for chills and fever.  HENT: Negative.    Eyes: Negative.   Respiratory: Negative.    Cardiovascular: Negative.   Gastrointestinal: Negative.   Endocrine: Negative.   Genitourinary:  Positive for flank pain.  Skin: Negative.   Allergic/Immunologic: Negative.   Hematological: Negative.   Psychiatric/Behavioral: Negative.    Blood pressure 127/64, pulse 76, temperature 99 F (37.2 C), temperature source Tympanic, resp. rate 16, height _0  (1.626 m), weight 75.3 kg, SpO2 90 %. Physical Exam Vitals reviewed.  Constitutional:      Comments: Very mentally astute for age  HENT:     Head: Normocephalic.     Nose: Nose normal.  Eyes:     Pupils: Pupils are equal, round, and reactive to light.  Cardiovascular:     Rate and Rhythm: Normal rate.     Pulses: Normal pulses.  Pulmonary:     Effort: Pulmonary effort is normal.  Abdominal:     General: Abdomen  is flat.  Genitourinary:    Comments: Minimal Rt CVAT at present.  Musculoskeletal:        General: Normal range of motion.  Skin:    General: Skin is warm.  Neurological:     General:  No focal deficit present.     Mental Status: He is alert.  Psychiatric:        Mood and Affect: Mood normal.    Assessment/Plan:  1 - RIGHT Distal Ureteral Stone - refracotry colic and now acute renal failure. Rec RIGHT ureteroscopy today with goal of Rt side stone free and removed nidus of colic. Will plan for at least temporary peri-op stent. Risks, benefits, alternatives, expected peri-op course discussed. If any intra-op concern for more infectious parameters, will plan to stent alone and then URS in staged fashion. He voiced understanding.   2 -  Acute Renal Failure - likely multifactorial with some post-renal obstruction + pre-renal dehydration + some contrast nephrolpathy. Will likley resolve with gently IV hydration and stone treatment.   3 - Left Adrenal Mass - quite concerning for primary neoplasm. No h/o addiitonal primary tumor to suggest mets. Will performed serum metanephrines in house. No therapy this admission.   4 -  Prostate Cancer - has met all criteria for cure, observe.     Alexis Frock 11/07/2020, 5:14 AM

## 2020-11-07 NOTE — H&P (Signed)
History and Physical    [name] GYK:[id number] DOB: Jun 16, 1926 DOA: 11/06/2020  PCP: [name], MD  Patient coming from: Home  Chief Complaint: RLQ ab pain.   HPI: [name] is a 87 y.o. male with medical history significant of CKD3a, CAD s/p CABG, prostate CA s/p cryotherapy, MDS. Presenting with RLQ abdominal pain. Started 4 days ago. It "felt like someone was digging in there." It was 9/10 pain that initially was in waves, but progressed to being constant. Initially he tried hydrocodone, but after a few doses, it was not providing any relief. He had not N/V. His urine was clear. It progressed to the point he could no longer tolerate the pain, so he went to the ED. He denies any other aggravating or alleviating factors.    ED Course: Found to have an increase in his Scr to 3.5. Imaging with right ureter obstruction. Urology consulted. TRH called for admission.   Review of Systems:  Denies CP, palpitations, dyspnea, syncopal episodes, N/V/D, fevers. Review of systems is otherwise negative for all not mentioned in HPI.   PMHx Past Medical History:  Diagnosis Date   BPH (benign prostatic hypertrophy)    Chronic systolic heart failure (HCC)    Coronary artery disease    Multivessel status post CABG 8/06, LIMA-LAD, SVG-CFX, SVG-distal RCA   Essential hypertension    Hyperlipidemia    ICD (implantable cardiac defibrillator) in place    Medtronic   Ischemic cardiomyopathy    Myelodysplasia, low grade ([place]) 11/27/2015   Nephrolithiasis    Prostate cancer ([place])    Vitamin B 12 deficiency 07/27/2016    PSHx Past Surgical History:  Procedure Laterality Date   CARDIAC DEFIBRILLATOR PLACEMENT     COLONOSCOPY     COLONOSCOPY N/A 08/19/2013   Procedure: COLONOSCOPY;  Surgeon: Rogene Houston, MD;  [facility];  Service: Endoscopy;  Laterality: N/A;  35   CORONARY ARTERY BYPASS GRAFT     8/06 with left anterior descending artery, saphenous vein graft to circumflex,  saphenous vein graft to distal right coronary artery   HERNIA REPAIR     POLYPECTOMY     PROSTATE SURGERY      SocHx  reports that he quit smoking about 28 years ago. His smoking use included cigarettes. He has a 11.00 pack-year smoking history. He has never used smokeless tobacco. He reports that he does not drink alcohol and does not use drugs.  Allergies  Allergen Reactions   Codeine Nausea And Vomiting    FamHx Family History  Problem Relation Age of Onset   Colon cancer Father    Coronary artery disease Other     Prior to Admission medications   Medication Sig Start Date End Date Taking? Authorizing Provider  acetaminophen (TYLENOL) 500 MG tablet Take 1 tablet (500 mg total) by mouth every 6 (six) hours as needed for mild pain or fever. 07/14/15   Carmin Muskrat, MD  aspirin 81 MG chewable tablet Chew 81 mg by mouth daily.    [provider]  carvedilol (COREG) 6.25 MG tablet TAKE 1 TABLET(6.25 MG) BY MOUTH TWICE DAILY 09/29/20   Satira Sark, MD  cetirizine (ZYRTEC) 10 MG tablet Take 10 mg by mouth daily as needed for allergies.    [provider]  Cholecalciferol (VITAMIN D3) 1000 UNITS CAPS Take 1 capsule by mouth daily.    [provider]  cyanocobalamin (,VITAMIN B-12,) 1000 MCG/ML injection ADMINISTER 1 ML IN THE MUSCLE  1 TIME AS DIRECTED 12/31/19   Derek Jack, MD  diphenhydrAMINE (BENADRYL) 25 MG tablet Take 25 mg by mouth at bedtime as needed.     [provider]  HYDROcodone-acetaminophen (NORCO/VICODIN) 5-325 MG tablet Take 1 tablet by mouth every 6 (six) hours as needed for severe pain. 11/04/20   Truddie Hidden, MD  losartan (COZAAR) 50 MG tablet TAKE 1 TABLET(50 MG) BY MOUTH DAILY 06/13/20   Satira Sark, MD  nitroGLYCERIN (NITROSTAT) 0.4 MG SL tablet Place 1 tablet (0.4 mg total) under the tongue every 5 (five) minutes as needed. 06/08/19   Imogene Burn, PA-C  simvastatin (ZOCOR) 40 MG tablet TAKE 1  TABLET BY MOUTH EVERY DAY IN THE EVENING 06/13/20   Satira Sark, MD  spironolactone (ALDACTONE) 25 MG tablet TAKE 1/2 TABLET(12.5 MG) BY MOUTH DAILY 06/13/20   Satira Sark, MD  vitamin C (ASCORBIC ACID) 500 MG tablet Take 500 mg by mouth daily.      [provider]    Physical Exam: Vitals:   11/07/20 0400 11/07/20 0711 11/07/20 0814 11/07/20 0903  BP: 127/64 (!) 143/67 140/63 99/81  Pulse: 76 90 90 85  Resp: 16 16 15 18   Temp:  99.5 F (37.5 C)  99.5 F (37.5 C)  TempSrc:  Oral  Oral  SpO2: 90% 94% 92% 93%  Weight:      Height:        General: 87 y.o. male resting in bed in NAD Eyes: PERRL, normal sclera ENMT: Nares patent w/o discharge, orophaynx clear, dentition normal, ears w/o discharge/lesions/ulcers Neck: Supple, trachea midline Cardiovascular: RRR, +S1, S2, no m/g/r, equal pulses throughout Respiratory: CTABL, no w/r/r, normal WOB GI: BS+, NDNT, no masses noted, no organomegaly noted MSK: No e/c/c Skin: No rashes, bruises, ulcerations noted Neuro: A&O x 3, no focal deficits Psyc: Appropriate interaction and affect, calm/cooperative  Labs on Admission: I have personally reviewed following labs and imaging studies  CBC: Recent Labs  Lab 11/04/20 0615 11/06/20 2310  WBC 4.9 5.5  NEUTROABS  --  2.3  HGB 12.8* 11.5*  HCT 39.0 34.2*  MCV 96.1 93.2  PLT 90* 80*   Basic Metabolic Panel: Recent Labs  Lab 11/04/20 0615 11/06/20 2310  NA 140 136  K 4.2 4.8  CL 105 102  CO2 28 25  GLUCOSE 116* 197*  BUN 30* 50*  CREATININE 1.50* 3.55*  CALCIUM 9.3 8.6*   GFR: Estimated Creatinine Clearance: 12.6 mL/min (A) (by C-G formula based on SCr of 3.55 mg/dL (H)). Liver Function Tests: Recent Labs  Lab 11/04/20 0615  AST 13*  ALT 11  ALKPHOS 41  BILITOT 0.8  PROT 6.7  ALBUMIN 4.0   Recent Labs  Lab 11/04/20 0615  LIPASE 23   No results for input(s): AMMONIA in the last 168 hours. Coagulation Profile: No results for input(s): INR,  PROTIME in the last 168 hours. Cardiac Enzymes: No results for input(s): CKTOTAL, CKMB, CKMBINDEX, TROPONINI in the last 168 hours. BNP (last 3 results) No results for input(s): PROBNP in the last 8760 hours. HbA1C: No results for input(s): HGBA1C in the last 72 hours. CBG: No results for input(s): GLUCAP in the last 168 hours. Lipid Profile: No results for input(s): CHOL, HDL, LDLCALC, TRIG, CHOLHDL, LDLDIRECT in the last 72 hours. Thyroid Function Tests: No results for input(s): TSH, T4TOTAL, FREET4, T3FREE, THYROIDAB in the last 72 hours. Anemia Panel: No results for input(s): VITAMINB12, FOLATE, FERRITIN, TIBC, IRON, RETICCTPCT in the last  72 hours. Urine analysis:    Component Value Date/Time   COLORURINE YELLOW 11/07/2020 0000   APPEARANCEUR CLEAR 11/07/2020 0000   LABSPEC 1.031 (H) 11/07/2020 0000   PHURINE 5.5 11/07/2020 0000   GLUCOSEU NEGATIVE 11/07/2020 0000   HGBUR NEGATIVE 11/07/2020 0000   BILIRUBINUR NEGATIVE 11/07/2020 0000   KETONESUR NEGATIVE 11/07/2020 0000   PROTEINUR 100 (A) 11/07/2020 0000   NITRITE NEGATIVE 11/07/2020 0000   LEUKOCYTESUR LARGE (A) 11/07/2020 0000    Radiological Exams on Admission: DG Abdomen 1 View  Result Date: 11/07/2020 CLINICAL DATA:  Abdominal pain.  History of recent right stone. EXAM: ABDOMEN - 1 VIEW COMPARISON:  CT 11/04/2020 FINDINGS: Contrast material is noted within the mid to distal right ureter from prior contrast enhanced CT study. Within the right lower pelvis, there is a small density seen which likely reflects the previously seen distal right ureteral stone. Nonobstructive bowel gas pattern. Moderate stool throughout the colon. No organomegaly. IMPRESSION: Probable continued small distal right ureteral stone. Contrast material seen within the mid to distal right ureter leading to this stone. Electronically Signed   By: Rolm Baptise M.D.   On: 11/07/2020 00:06    EKG: Independently reviewed. Sinus, no st  elevations  Assessment/Plan AKI on CKD3a Right obstructive uropathy Ab pain     - admit to inpt, tele     - IVF, pain control, anti-emetics     - urology to see; possible scope and stent today; keep NPO for now  Left adrenal mass     - as seen on CT     - urology has ordered metanephrine     - follows w/ Dr. Tera Helper at [facility] for [place]; ok to continue outpt follow up; next appt is in August; arrange for earlier follow up at discharge  HLD CAD s/p CABG     - continue home ASA, statin, BB when NPO status d/c'd  HTN     - resume home meds when NPO status d/c'd     - will have PRNs available  Hyperglycemia     - no previous Dx of DM     - check A1c  DVT prophylaxis: SCDs  Code Status: FULL  Family Communication: None at bedside.  Consults called: Urology  Status is: Inpatient  Remains inpatient appropriate because:Inpatient level of care appropriate due to severity of illness  Dispo: The patient is from: Home              Anticipated d/c is to: Home              Patient currently is not medically stable to d/c.   Difficult to place patient No  Time spent coordinating admission: 65 minutes  [facility]  If 7PM-7AM, please contact night-coverage [email or web address]  11/07/2020, 9:07 AM

## 2020-11-07 NOTE — Anesthesia Preprocedure Evaluation (Addendum)
Anesthesia Evaluation  Patient identified by MRN, date of birth, ID band Patient awake    Reviewed: Allergy & Precautions, Patient's Chart, lab work & pertinent test results  Airway Mallampati: II  TM Distance: >3 FB     Dental   Pulmonary former smoker,  Quit smoking 1994, 11 pack year history    breath sounds clear to auscultation       Cardiovascular hypertension, Pt. on medications and Pt. on home beta blockers pulmonary hypertension (mild)+ CAD, + Past MI, + CABG (CABG 2006) and +CHF  + Cardiac Defibrillator (hx ischemic HF, last LVEF 45-50%) + Valvular Problems/Murmurs (mild MR) MR  Rhythm:Regular Rate:Normal  Last echo 2016: LVEF 61-60%, grade 1 diastolic dysfunction, mild MR, mild pHTN   History noted Dr. Nyoka Cowden   Neuro/Psych negative neurological ROS  negative psych ROS   GI/Hepatic negative GI ROS, Neg liver ROS,   Endo/Other  negative endocrine ROS  Renal/GU ARF and CRFRenal diseaseCr 3.55- AKI on known CKD 3 ARF 2/2 right ureteral stone  negative genitourinary   Musculoskeletal  (+) Arthritis , Osteoarthritis,    Abdominal   Peds  Hematology  (+) Blood dyscrasia, anemia , hct 34.2, plt 80 Hx myelodysplasia   Anesthesia Other Findings   Reproductive/Obstetrics negative OB ROS                           Anesthesia Physical Anesthesia Plan  ASA: 3  Anesthesia Plan: General   Post-op Pain Management:    Induction: Intravenous  PONV Risk Score and Plan: 2 and Ondansetron and Dexamethasone  Airway Management Planned: LMA  Additional Equipment:   Intra-op Plan:   Post-operative Plan: Extubation in OR  Informed Consent: I have reviewed the patients History and Physical, chart, labs and discussed the procedure including the risks, benefits and alternatives for the proposed anesthesia with the patient or authorized representative who has indicated his/her understanding and  acceptance.     Dental advisory given  Plan Discussed with: CRNA and Anesthesiologist  Anesthesia Plan Comments:         Anesthesia Quick Evaluation

## 2020-11-08 ENCOUNTER — Encounter (HOSPITAL_COMMUNITY): Payer: Self-pay | Admitting: Urology

## 2020-11-08 DIAGNOSIS — N139 Obstructive and reflux uropathy, unspecified: Secondary | ICD-10-CM

## 2020-11-08 LAB — URINE CULTURE: Culture: NO GROWTH

## 2020-11-08 LAB — COMPREHENSIVE METABOLIC PANEL
ALT: 19 U/L (ref 0–44)
AST: 14 U/L — ABNORMAL LOW (ref 15–41)
Albumin: 3.1 g/dL — ABNORMAL LOW (ref 3.5–5.0)
Alkaline Phosphatase: 39 U/L (ref 38–126)
Anion gap: 8 (ref 5–15)
BUN: 47 mg/dL — ABNORMAL HIGH (ref 8–23)
CO2: 21 mmol/L — ABNORMAL LOW (ref 22–32)
Calcium: 7.9 mg/dL — ABNORMAL LOW (ref 8.9–10.3)
Chloride: 107 mmol/L (ref 98–111)
Creatinine, Ser: 2.91 mg/dL — ABNORMAL HIGH (ref 0.61–1.24)
GFR, Estimated: 20 mL/min — ABNORMAL LOW (ref >60)
Glucose, Bld: 184 mg/dL — ABNORMAL HIGH (ref 70–99)
Potassium: 5.1 mmol/L (ref 3.5–5.1)
Sodium: 136 mmol/L (ref 135–145)
Total Bilirubin: 0.8 mg/dL (ref 0.3–1.2)
Total Protein: 6 g/dL — ABNORMAL LOW (ref 6.5–8.1)

## 2020-11-08 LAB — CBC
HCT: 35.1 % — ABNORMAL LOW (ref 39.0–52.0)
Hemoglobin: 11.4 g/dL — ABNORMAL LOW (ref 13.0–17.0)
MCH: 31.3 pg (ref 26.0–34.0)
MCHC: 32.5 g/dL (ref 30.0–36.0)
MCV: 96.4 fL (ref 80.0–100.0)
Platelets: 66 10*3/uL — ABNORMAL LOW (ref 150–400)
RBC: 3.64 MIL/uL — ABNORMAL LOW (ref 4.22–5.81)
RDW: 13 % (ref 11.5–15.5)
WBC: 4.4 10*3/uL (ref 4.0–10.5)
nRBC: 0 % (ref 0.0–0.2)

## 2020-11-08 LAB — HEMOGLOBIN A1C
Hgb A1c MFr Bld: 5.9 % — ABNORMAL HIGH (ref 4.8–5.6)
Mean Plasma Glucose: 123 mg/dL

## 2020-11-08 MED ORDER — ASPIRIN 81 MG PO CHEW
81.0000 mg | CHEWABLE_TABLET | Freq: Every day | ORAL | Status: DC
  Administered 2020-11-09: 81 mg via ORAL
  Filled 2020-11-08 (×2): qty 1

## 2020-11-08 MED ORDER — CARVEDILOL 6.25 MG PO TABS
6.2500 mg | ORAL_TABLET | Freq: Two times a day (BID) | ORAL | Status: DC
  Administered 2020-11-09: 6.25 mg via ORAL
  Filled 2020-11-08: qty 1

## 2020-11-08 MED ORDER — SIMVASTATIN 40 MG PO TABS
40.0000 mg | ORAL_TABLET | Freq: Every day | ORAL | Status: DC
  Administered 2020-11-08: 40 mg via ORAL
  Filled 2020-11-08: qty 1

## 2020-11-08 MED ORDER — SPIRONOLACTONE 12.5 MG HALF TABLET: 12.5000 mg | ORAL_TABLET | Freq: Every day | ORAL | Status: DC

## 2020-11-08 MED ORDER — LOSARTAN POTASSIUM 50 MG PO TABS: 50.0000 mg | ORAL_TABLET | Freq: Every day | ORAL | Status: DC

## 2020-11-08 NOTE — Progress Notes (Signed)
1 Day Post-Op   Subjective/Chief Complaint:  1 - RIGHT Distal Ureteral Stone - s/p right ureteroscopy / laser / tethered stent placement 11/07/20 to stone free. Stent removed at bedside 6/28.   2 -  Acute Renal Failure - baseline Cr 1.3 with rise to 3.5 by ER labs 6/26. Unilateral Rt ureteral obsturction, some poor PO intake, and received recent IV contrast few days prior. Trending better with hydration and after stone removal.    3 - Left Adrenal Mass - 5cm heterogeneous incidental left adrenal mass by CT 10/2020. No severe hypokalemia or refracotry hypertension. Metanephrines sent 6/27 and pending.    4 -  Prostate Cancer - s/p primary cryoablation in early 2000's by Dr. Rosana Hoes for unknown grade/stage disease. PSA 0.17 2015. (Met criteria for cure) PSA sent 6/27 and pending.    PMH sig for CAD/CABG/AICD (not limiting day to day). He is retired from [employer] and service in [place], was in [employer] prior.   Today "[name]" is seen in f/u above. No interval fevers, Cr trending better, feels improved after stone removal yesterday.    Objective: Vital signs in last 24 hours: Temp:  [97.5 F (36.4 C)-98.4 F (36.9 C)] 97.8 F (36.6 C) (06/28 1337) Pulse Rate:  [66-75] 70 (06/28 1337) Resp:  [14-18] 18 (06/28 1337) BP: (113-144)/(53-67) 128/66 (06/28 1337) SpO2:  [92 %-97 %] 96 % (06/28 1337) Last BM Date: 11/03/20  Intake/Output from previous day: 06/27 0701 - 06/28 0700 In: 1420.3 [I.V.:1420.3] Out: 625 [Urine:625] Intake/Output this shift: Total I/O In: 578.6 [P.O.:360; I.V.:218.6] Out: -   NAD, son at bedside AOx3 Non-labored breathign on RA RRR SNTND No CVAT No sig LE edema  Tethered stent removed, inspected and intact and discarded.   Lab Results:  Recent Labs    11/07/20 1035 11/08/20 0511  WBC 7.4 4.4  HGB 11.5* 11.4*  HCT 35.1* 35.1*  PLT 76* 66*   BMET Recent Labs    11/07/20 1035 11/08/20 0511  NA 137 136  K 5.1 5.1  CL 104 107  CO2 26 21*   GLUCOSE 149* 184*  BUN 51* 47*  CREATININE 3.43* 2.91*  CALCIUM 8.2* 7.9*   PT/INR No results for input(s): LABPROT, INR in the last 72 hours. ABG No results for input(s): PHART, HCO3 in the last 72 hours.  Invalid input(s): PCO2, PO2  Studies/Results: DG Abdomen 1 View  Result Date: 11/07/2020 CLINICAL DATA:  Abdominal pain.  History of recent right stone. EXAM: ABDOMEN - 1 VIEW COMPARISON:  CT 11/04/2020 FINDINGS: Contrast material is noted within the mid to distal right ureter from prior contrast enhanced CT study. Within the right lower pelvis, there is a small density seen which likely reflects the previously seen distal right ureteral stone. Nonobstructive bowel gas pattern. Moderate stool throughout the colon. No organomegaly. IMPRESSION: Probable continued small distal right ureteral stone. Contrast material seen within the mid to distal right ureter leading to this stone. Electronically Signed   By: Rolm Baptise M.D.   On: 11/07/2020 00:06   DG C-Arm 1-60 Min-No Report  Result Date: 11/07/2020 Fluoroscopy was utilized by the requesting physician.  No radiographic interpretation.    Anti-infectives: Anti-infectives (From admission, onward)    Start     Dose/Rate Route Frequency Ordered Stop   11/07/20 0100  cefTRIAXone (ROCEPHIN) 1 g in sodium chloride 0.9 % 100 mL IVPB        1 g 200 mL/hr over 30 Minutes Intravenous  Once 11/07/20 0057 11/07/20  0153       Assessment/Plan:   1 - RIGHT Distal Ureteral Stone - now stone free / stent free.    2 -  Acute Renal Failure -  improving with resolved obstruciton. This will likely trend towars prior baseline.    3 - Left Adrenal Mass - quite concerning for primary neoplasm. No h/o addiitonal primary tumor to suggest mets. He may not want aggressive treatment unless progressive or overtely endocrine active. DDX discussed with pt and son.   We will perform further workup / surveillance as outpateint.    4 -  Prostate Cancer -  has met criteria for cure.  We have reqeusted elective outpatient GU follow up, will follow PRN at this point. OK for DC at anytime from GU perspective. Please call me directly with questions anytime.   Alexis Frock 11/08/2020

## 2020-11-08 NOTE — Progress Notes (Addendum)
[facility]  PROGRESS NOTE  [name] VQX:[id number] DOB: 02/27/1927 DOA: 11/06/2020 PCP: [name], MD   Brief HPI:   87 year old male with medical history of CKD stage III a, CAD s/p CABG, prostate cancer s/p cryotherapy, MDS presented with right lower quadrant abdominal pain for 4 days.  Found to have serum creatinine of 3.5, imaging showed right ureteral obstruction.  Urology was consulted.  He underwent cystoscopy with right retrograde pyelogram, right ureteroscopy with laser lithotripsy and insertion of right ureteral stent.    Subjective   Patient seen and examined, denies abdominal pain.  No dysuria.   Assessment/Plan:    Acute kidney injury on CKD stage IIIa -Patient baseline creatinine is 1.27 as of February 2022 -Presented with creatinine of 3.55 -Found to have right ureteral obstruction ;underwent lithotripsy, and stent placement per urology -Stent was removed at bedside by urologist -Creatinine is down to 2.91 -We will follow BUN/creatinine in a.m.   Left adrenal mass -CT abdomen showed 5 cm heterogenous incidental left adrenal mass -Metanephrines sent on 11/07/2020; result pending -Patient to follow-up with urology as outpatient for further evaluation  Prostate cancer -S/p cryoablation in early 2000's -Follow PSA -Follow-up urology as outpatient  History of ischemic cardiomyopathy -Multivessel CAD s/p CABG in 2006 -Continue aspirin, Coreg, lZocor -Losartan and Aldactone on hold for mildly elevated potassium of 5.1   Scheduled medications:         Data Reviewed:   CBG:  No results for input(s): GLUCAP in the last 168 hours.  SpO2: 96 %    Vitals:   11/07/20 2333 11/08/20 0040 11/08/20 0501 11/08/20 1337  BP: 125/67 (!) 113/56 (!) 144/61 128/66  Pulse: 71 68 69 70  Resp: 14 16 16 18   Temp: (!) 97.5 F (36.4 C) 98 F (36.7 C) 97.8 F (36.6 C) 97.8 F (36.6 C)  TempSrc: Oral Oral Oral Oral  SpO2: 95% 93% 97% 96%  Weight:       Height:         Intake/Output Summary (Last 24 hours) at 11/08/2020 1818 Last data filed at 11/08/2020 1732 Gross per 24 hour  Intake 1251.16 ml  Output 475 ml  Net 776.16 ml    06/26 1901 - 06/28 0700 In: 1420.3 [I.V.:1420.3] Out: 625 [Urine:625]  Filed Weights   11/06/20 2231  Weight: 75.3 kg    CBC:  Recent Labs  Lab 11/04/20 0615 11/06/20 2310 11/07/20 1035 11/08/20 0511  WBC 4.9 5.5 7.4 4.4  HGB 12.8* 11.5* 11.5* 11.4*  HCT 39.0 34.2* 35.1* 35.1*  PLT 90* 80* 76* 66*  MCV 96.1 93.2 96.2 96.4  MCH 31.5 31.3 31.5 31.3  MCHC 32.8 33.6 32.8 32.5  RDW 13.1 13.1 13.1 13.0  LYMPHSABS  --  0.7 0.6*  --   MONOABS  --  2.5* 3.6*  --   EOSABS  --  0.0 0.0  --   BASOSABS  --  0.0 0.0  --     Complete metabolic panel:  Recent Labs  Lab 11/04/20 0615 11/06/20 2310 11/07/20 1035 11/08/20 0511  NA 140 136 137 136  K 4.2 4.8 5.1 5.1  CL 105 102 104 107  CO2 28 25 26  21*  GLUCOSE 116* 197* 149* 184*  BUN 30* 50* 51* 47*  CREATININE 1.50* 3.55* 3.43* 2.91*  CALCIUM 9.3 8.6* 8.2* 7.9*  AST 13*  --  18 14*  ALT 11  --  21 19  ALKPHOS 41  --  41 39  BILITOT 0.8  --  0.9 0.8  ALBUMIN 4.0  --  3.6 3.1*  MG  --   --  1.8  --   HGBA1C  --   --  5.9*  --     Recent Labs  Lab 11/04/20 0615  LIPASE 23    Recent Labs  Lab 11/07/20 0023  SARSCOV2NAA NEGATIVE    ------------------------------------------------------------------------------------------------------------------ No results for input(s): CHOL, HDL, LDLCALC, TRIG, CHOLHDL, LDLDIRECT in the last 72 hours.  Lab Results  Component Value Date   HGBA1C 5.9 (H) 11/07/2020   ------------------------------------------------------------------------------------------------------------------ No results for input(s): TSH, T4TOTAL, T3FREE, THYROIDAB in the last 72 hours.  Invalid input(s):  FREET3 ------------------------------------------------------------------------------------------------------------------ No results for input(s): VITAMINB12, FOLATE, FERRITIN, TIBC, IRON, RETICCTPCT in the last 72 hours.  Coagulation profile No results for input(s): INR, PROTIME in the last 168 hours. No results for input(s): DDIMER in the last 72 hours.  Cardiac Enzymes No results for input(s): CKTOTAL, CKMB, CKMBINDEX, TROPONINI in the last 168 hours.  ------------------------------------------------------------------------------------------------------------------    Component Value Date/Time   BNP 205.0 (H) 07/14/2015 2015   BNP 42.7 02/12/2011 0910     Antibiotics: Anti-infectives (From admission, onward)    Start     Dose/Rate Route Frequency Ordered Stop   11/07/20 0100  cefTRIAXone (ROCEPHIN) 1 g in sodium chloride 0.9 % 100 mL IVPB        1 g 200 mL/hr over 30 Minutes Intravenous  Once 11/07/20 0057 11/07/20 0153        Radiology Reports  DG Abdomen 1 View  Result Date: 11/07/2020 CLINICAL DATA:  Abdominal pain.  History of recent right stone. EXAM: ABDOMEN - 1 VIEW COMPARISON:  CT 11/04/2020 FINDINGS: Contrast material is noted within the mid to distal right ureter from prior contrast enhanced CT study. Within the right lower pelvis, there is a small density seen which likely reflects the previously seen distal right ureteral stone. Nonobstructive bowel gas pattern. Moderate stool throughout the colon. No organomegaly. IMPRESSION: Probable continued small distal right ureteral stone. Contrast material seen within the mid to distal right ureter leading to this stone. Electronically Signed   By: Rolm Baptise M.D.   On: 11/07/2020 00:06   DG C-Arm 1-60 Min-No Report  Result Date: 11/07/2020 Fluoroscopy was utilized by the requesting physician.  No radiographic interpretation.      DVT prophylaxis: SCDs  Code Status: Full code  Family Communication: No family at  bedside   Consultants: Urology  Procedures: Right ureteroscopy, laser lithotripsy, stent placement    Objective    Physical Examination:   General-appears in no acute distress Heart-S1-S2, regular, no murmur auscultated Lungs-clear to auscultation bilaterally, no wheezing or crackles auscultated Abdomen-soft, nontender, no organomegaly Extremities-no edema in the lower extremities Neuro-alert, oriented x3, no focal deficit noted   Status is: Inpatient  Dispo: The patient is from: Home              Anticipated d/c is to: Home              Anticipated d/c date is: 11/09/2020              Patient currently not stable for discharge  Barrier to discharge-monitoring renal function after ureteral stent removal  COVID-19 Labs  No results for input(s): DDIMER, FERRITIN, LDH, CRP in the last 72 hours.  Lab Results  Component Value Date   [place] NEGATIVE 11/07/2020    Microbiology  Recent Results (from the past 240 hour(s))  Resp Panel  by RT-PCR (Flu A&B, Covid) Urine, Clean Catch     Status: None   Collection Time: 11/07/20 12:23 AM   Specimen: Urine, Clean Catch; Nasopharyngeal(NP) swabs in vial transport medium  Result Value Ref Range Status   SARS Coronavirus 2 by RT PCR NEGATIVE NEGATIVE Final    Comment: (NOTE) SARS-CoV-2 target nucleic acids are NOT DETECTED.  The SARS-CoV-2 RNA is generally detectable in upper respiratory specimens during the acute phase of infection. The lowest concentration of SARS-CoV-2 viral copies this assay can detect is 138 copies/mL. A negative result does not preclude SARS-Cov-2 infection and should not be used as the sole basis for treatment or other patient management decisions. A negative result may occur with  improper specimen collection/handling, submission of specimen other than nasopharyngeal swab, presence of viral mutation(s) within the areas targeted by this assay, and inadequate number of viral copies(<138  copies/mL). A negative result must be combined with clinical observations, patient history, and epidemiological information. The expected result is Negative.  Fact Sheet for Patients:  EntrepreneurPulse.com.au  Fact Sheet for Healthcare Providers:  IncredibleEmployment.be  This test is no t yet approved or cleared by the [place] FDA and  has been authorized for detection and/or diagnosis of SARS-CoV-2 by FDA under an Emergency Use Authorization (EUA). This EUA will remain  in effect (meaning this test can be used) for the duration of the COVID-19 declaration under Section 564(b)(1) of the Act, 21 U.S.C.section 360bbb-3(b)(1), unless the authorization is terminated  or revoked sooner.       Influenza A by PCR NEGATIVE NEGATIVE Final   Influenza B by PCR NEGATIVE NEGATIVE Final    Comment: (NOTE) The Xpert Xpress SARS-CoV-2/FLU/RSV plus assay is intended as an aid in the diagnosis of influenza from Nasopharyngeal swab specimens and should not be used as a sole basis for treatment. Nasal washings and aspirates are unacceptable for Xpert Xpress SARS-CoV-2/FLU/RSV testing.  Fact Sheet for Patients: EntrepreneurPulse.com.au  Fact Sheet for Healthcare Providers: IncredibleEmployment.be  This test is not yet approved or cleared by the [place] FDA and has been authorized for detection and/or diagnosis of SARS-CoV-2 by FDA under an Emergency Use Authorization (EUA). This EUA will remain in effect (meaning this test can be used) for the duration of the COVID-19 declaration under Section 564(b)(1) of the Act, 21 U.S.C. section 360bbb-3(b)(1), unless the authorization is terminated or revoked.  Performed at [facility], [address], [place], [place] [address]   Urine culture     Status: None   Collection Time: 11/07/20 12:44 AM   Specimen: Urine, Clean Catch  Result Value Ref  Range Status   Specimen Description   Final    URINE, CLEAN CATCH Performed at [facility], [address], [place], [place] [address]    Special Requests   Final    NONE Performed at [facility], [address] [place], [place] [address]    Culture   Final    NO GROWTH Performed at [facility], [place] [address] [place], [place] [address]    Report Status 11/08/2020 FINAL  Final       Oswald Hillock   [facility] If 7PM-7AM, please contact night-coverage at [email or web address], Office  [phone]   11/08/2020, 6:18 PM  LOS: 1 day

## 2020-11-08 NOTE — Op Note (Signed)
NAMEEDRIAN, [name] MEDICAL RECORD NO: [id number] ACCOUNT NO: [id number] DATE OF BIRTH: 07-Jan-1927 FACILITY: [facility] LOCATION: [id number] PHYSICIAN: Alexis Frock, MD  Operative Report   DATE OF PROCEDURE: 11/07/2020  PREOPERATIVE DIAGNOSES:  Right ureteral stone with refractory colic and acute renal failure.  PROCEDURE PERFORMED:  1.  Cystoscopy with right retrograde pyelogram interpretation. 2.  Right ureteroscopy with laser lithotripsy. 3.  Insertion of right ureteral stent, 5 x 26 Polaris with tether.  ESTIMATED BLOOD LOSS:  Nil.  COMPLICATIONS:  None.  SPECIMEN:  Right ureteral stone fragment for composition analysis.  FINDINGS: 1.  Mild hydronephrosis to distal ureteral stone. 2.  Successful laser lithotripsy and basket extraction of right ureteral stone fragments. 3.  Successful placement of right ureteral stent, proximal end in renal pelvis, distal end in urinary bladder.  INDICATIONS FOR PROCEDURE:  The patient is a very pleasant and quite vigorous 87 year old male with history of recurrent urolithiasis.  He is status post medical passage episode times several.  He also has a remote history of prostate cancer.  He was  seen in the emergency department several days ago.  On evaluation of flank pain, found to have a right distal ureteral stone.  He was appropriately given a trial of medical therapy as he has had success with this many times before.  Unfortunately, his  colic remained refractory.  He represented to the emergency department and was found to be in acute renal failure with rise in creatinine from baseline of 1s up to 3.5.  No hyperkalemia.  He had some poor p.o. intake, again unilateral ureteral  obstruction and some recent history of IV contrast use likely contributing to multifactorial acute renal failure.  Options further discussed including recommended path of renal decompression with management of stone versus stenting and he wished to  proceed.  Informed consent  was obtained and placed in the medical record.  DESCRIPTION OF PROCEDURE:   The patient being verified, procedure being right ureteroscopic stone manipulation was confirmed.  Procedure timeout was performed.  Intravenous antibiotics administered.  General anesthesia induced.  The patient was placed in the low  lithotomy position.  Sterile field was created, prepped and draped the patient's penis, perineum and proximal thigh using iodine.  Cystourethroscopy was performed using 21-French rigid cystoscope with offset lens.  Inspection of the anterior and  posterior urethra were unremarkable.  Inspection of bladder revealed no diverticula, calcifications or papillary lesions.  The right ureteral orifice was cannulated with a 6-French renal catheter and right retrograde pyelogram was obtained.  A very gentle right retrograde pyelogram demonstrated single right ureter with filling defect in the distal ureter consistent with known stone with mild hydronephrosis above this.  A 0.038 ZIPwire was advanced to the level of the upper pole, set aside as  a safety wire.  An 8-French feeding tube placed in the urinary bladder for pressure release and semi-rigid ureteroscopy was performed in the distal right ureter alongside a separate sensor working wire.  As expected, there was a single ureteral stone  just above the intramural ureter.  This was just too large for simple basketing.  As such, holmium laser energy was applied to the stone using setting of 0.2 joules and 20 Hz and the stone was broken into two smaller pieces, which were then sequentially  grasped on their long axis and removed, set aside for composition analysis.  Notably, a negative flow, zero pressure technique was used during lithotripsy to prevent any pyelovenous bacterial backflow. Given his acute  renal failure, it was felt that  brief interval stenting with a tether stent would be most advantageous to ensure adequate renal decompression and a new 5 x  26 Polaris type stent was placed from a safety wire using fluoroscopic guidance and good proximal and distal plane were noted.   Tether was left in place, fashioned to the dorsum of penis.  The procedure was terminated.  The patient tolerated procedure well.  No immediate perioperative complications.  The patient was taken to postanesthesia care unit in stable condition, with plan  for observation admission, likely discharge home tomorrow versus the following day pending recovery of renal function and verifying no progression of any infectious parameters.   SHW D: 11/07/2020 5:41:03 pm T: 11/07/2020 10:47:00 pm  JOB: [id number]/ [id number]

## 2020-11-09 LAB — BASIC METABOLIC PANEL
Anion gap: 6 (ref 5–15)
BUN: 48 mg/dL — ABNORMAL HIGH (ref 8–23)
CO2: 25 mmol/L (ref 22–32)
Calcium: 8.6 mg/dL — ABNORMAL LOW (ref 8.9–10.3)
Chloride: 107 mmol/L (ref 98–111)
Creatinine, Ser: 2.02 mg/dL — ABNORMAL HIGH (ref 0.61–1.24)
GFR, Estimated: 30 mL/min — ABNORMAL LOW (ref >60)
Glucose, Bld: 137 mg/dL — ABNORMAL HIGH (ref 70–99)
Potassium: 4.9 mmol/L (ref 3.5–5.1)
Sodium: 138 mmol/L (ref 135–145)

## 2020-11-09 LAB — METANEPHRINES, PLASMA
Metanephrine, Free: 41 pg/mL (ref 0.0–88.0)
Normetanephrine, Free: 312.5 pg/mL — ABNORMAL HIGH (ref 0.0–297.2)

## 2020-11-09 LAB — PSA, TOTAL AND FREE
PSA, Free Pct: 70 %
PSA, Free: 0.07 ng/mL
Prostate Specific Ag, Serum: 0.1 ng/mL (ref 0.0–4.0)

## 2020-11-09 NOTE — Discharge Summary (Signed)
Physician Discharge Summary   Patient ID: [name] MRN: [id number] DOB/AGE: [birth year] 87 y.o.  Admit date: 11/06/2020 Discharge date: 11/09/2020  Primary Care Physician:  [name], MD   Recommendations for Outpatient Follow-up:  Follow up with PCP in 1-2 weeks Please obtain BMP in one week  Spironolactone, losartan placed on hold due to acute kidney injury  Home Health: None, patient back to baseline with the PT evaluation Equipment/Devices:   Discharge Condition: stable  CODE STATUS: FULL  Diet recommendation: Heart healthy diet   Discharge Diagnoses:     Right distal ureteral stone  AKI (acute kidney injury) ([place]) superimposed on CKD stage IIIa  Left adrenal mass History of prostate CA History of ischemic cardiomyopathy  Consults: Urology    Allergies:   Allergies  Allergen Reactions   Codeine Nausea And Vomiting     DISCHARGE MEDICATIONS: Allergies as of 11/09/2020       Reactions   Codeine Nausea And Vomiting        Medication List     STOP taking these medications    losartan 50 MG tablet Commonly known as: COZAAR   spironolactone 25 MG tablet Commonly known as: ALDACTONE       TAKE these medications    acetaminophen 500 MG tablet Commonly known as: TYLENOL Take 1 tablet (500 mg total) by mouth every 6 (six) hours as needed for mild pain or fever.   aspirin 81 MG chewable tablet Chew 81 mg by mouth daily.   carvedilol 6.25 MG tablet Commonly known as: COREG TAKE 1 TABLET(6.25 MG) BY MOUTH TWICE DAILY   cetirizine 10 MG tablet Commonly known as: ZYRTEC Take 10 mg by mouth daily as needed for allergies.   cyanocobalamin 1000 MCG/ML injection Commonly known as: (VITAMIN B-12) ADMINISTER 1 ML IN THE MUSCLE 1 TIME AS DIRECTED   diphenhydrAMINE 25 MG tablet Commonly known as: BENADRYL Take 25 mg by mouth at bedtime as needed for sleep.   HYDROcodone-acetaminophen 5-325 MG tablet Commonly known as:  NORCO/VICODIN Take 1 tablet by mouth every 6 (six) hours as needed for severe pain.   nitroGLYCERIN 0.4 MG SL tablet Commonly known as: NITROSTAT Place 1 tablet (0.4 mg total) under the tongue every 5 (five) minutes as needed.   simvastatin 40 MG tablet Commonly known as: ZOCOR TAKE 1 TABLET BY MOUTH EVERY DAY IN THE EVENING   vitamin C 500 MG tablet Commonly known as: ASCORBIC ACID Take 500 mg by mouth daily.   Vitamin D3 25 MCG (1000 UT) Caps Take 1 capsule by mouth daily.               Discharge Care Instructions  (From admission, onward)           Start     Ordered   11/09/20 0000  If the dressing is still on your incision site when you go home, remove it on the third day after your surgery date. Remove dressing if it begins to fall off, or if it is dirty or damaged before the third day.        11/09/20 1032             Brief H and P: For complete details please refer to admission H and P, but in brief 87 year old male with medical history of CKD stage III a, CAD s/p CABG, prostate cancer s/p cryotherapy, MDS presented with right lower quadrant abdominal pain for 4 days.  Found to have serum creatinine of 3.5,  imaging showed right ureteral obstruction.  Urology was consulted.  He underwent cystoscopy with right retrograde pyelogram, right ureteroscopy with laser lithotripsy and insertion of right ureteral stent.    Hospital Course:    Acute kidney injury on CKD stage IIIa -Baseline creatinine 1.27 in 06/2020.  Presented with creatinine of 3.55 -Patient was found to have right ureteral obstruction, underwent lithotripsy and stent placement per urology, stent was removed at bedside by urology -Patient was placed on IV fluid hydration -Creatinine has been steadily improving, currently improved to 2.0 -Losartan, spironolactone placed on hold, until labs rechecked outpatient by cardiology or PCP  Right distal ureteral obstruction -Urology was consulted, seen  by Dr. Bess Harvest, underwent lithotripsy and stent placement on 6/27 -Stent removed at bedside on 6/28  History of prostate CA -Status post primary cryoablation in early 2000's.  Outpatient follow-up with urology  Left adrenal mass -Incidentally seen on CT.  5 cm heterogeneous incidental left adrenal mass.  No severe hypokalemia and refractory hypertension. -Metanephrines labs are still in process  History of ischemic cardiomyopathy -Multivessel CAD status post CABG in 2006 -Continue aspirin, Coreg, Zocor -Losartan, Aldactone currently on hold    Day of Discharge S: No complaints, creatinine improving, hoping to go home today.  BP (!) 161/77 (BP Location: Left Arm)   Pulse 86   Temp 98.2 F (36.8 C) (Oral)   Resp 16   Ht 5\' 4"  (1.626 m)   Wt 75.3 kg   SpO2 94%   BMI 28.49 kg/m   Physical Exam: General: Alert and awake oriented x3 not in any acute distress. CVS: S1-S2 clear no murmur rubs or gallops Chest: clear to auscultation bilaterally, no wheezing rales or rhonchi Abdomen: soft nontender, nondistended, normal bowel sounds Extremities: no cyanosis, clubbing or edema noted bilaterally Neuro: no new FND's    Get Medicines reviewed and adjusted: Please take all your medications with you for your next visit with your Primary MD  Please request your Primary MD to go over all hospital tests and procedure/radiological results at the follow up. Please ask your Primary MD to get all Hospital records sent to his/her office.  If you experience worsening of your admission symptoms, develop shortness of breath, life threatening emergency, suicidal or homicidal thoughts you must seek medical attention immediately by calling 911 or calling your MD immediately  if symptoms less severe.  You must read complete instructions/literature along with all the possible adverse reactions/side effects for all the Medicines you take and that have been prescribed to you. Take any new Medicines after  you have completely understood and accept all the possible adverse reactions/side effects.   Do not drive when taking pain medications.   Do not take more than prescribed Pain, Sleep and Anxiety Medications  Special Instructions: If you have smoked or chewed Tobacco  in the last 2 yrs please stop smoking, stop any regular Alcohol  and or any Recreational drug use.  Wear Seat belts while driving.  Please note  You were cared for by a hospitalist during your hospital stay. Once you are discharged, your primary care physician will handle any further medical issues. Please note that NO REFILLS for any discharge medications will be authorized once you are discharged, as it is imperative that you return to your primary care physician (or establish a relationship with a primary care physician if you do not have one) for your aftercare needs so that they can reassess your need for medications and monitor your lab values.  The results of significant diagnostics from this hospitalization (including imaging, microbiology, ancillary and laboratory) are listed below for reference.      Procedures/Studies:  DG Abdomen 1 View  Result Date: 11/07/2020 CLINICAL DATA:  Abdominal pain.  History of recent right stone. EXAM: ABDOMEN - 1 VIEW COMPARISON:  CT 11/04/2020 FINDINGS: Contrast material is noted within the mid to distal right ureter from prior contrast enhanced CT study. Within the right lower pelvis, there is a small density seen which likely reflects the previously seen distal right ureteral stone. Nonobstructive bowel gas pattern. Moderate stool throughout the colon. No organomegaly. IMPRESSION: Probable continued small distal right ureteral stone. Contrast material seen within the mid to distal right ureter leading to this stone. Electronically Signed   By: Rolm Baptise M.D.   On: 11/07/2020 00:06   CT ABDOMEN PELVIS W CONTRAST  Result Date: 11/04/2020 CLINICAL DATA:  Right lower quadrant  abdominal pain. Clinical suspicion for appendicitis. EXAM: CT ABDOMEN AND PELVIS WITH CONTRAST TECHNIQUE: Multidetector CT imaging of the abdomen and pelvis was performed using the standard protocol following bolus administration of intravenous contrast. CONTRAST:  75mL OMNIPAQUE IOHEXOL 300 MG/ML  SOLN COMPARISON:  01/17/2017 FINDINGS: Lower chest: Enlarged heart. Atheromatous calcifications, including the coronary arteries and aorta. Post median sternotomy changes. Right ventricular AICD lead. Small amount of interval atelectasis or scarring in the lingula. Hepatobiliary: Multiple calcified gallstones in the gallbladder measuring up to 1.3 cm in maximum diameter each. No gallbladder wall thickening or pericholecystic fluid. Unremarkable liver. Pancreas: Moderate diffuse pancreatic atrophy without significant change. Duodenal diverticulum protruding into the head of the pancreas. Spleen: Normal in size without focal abnormality. Adrenals/Urinary Tract: Interval heterogeneous, lobulated in partially calcified left adrenal mass measuring 5.5 x 4.8 cm on image number 24/2 and abutting a partially calcified, tortuous splenic artery on that image. This measures 4.3 cm in length on coronal image number 59/5. Normal appearing right adrenal gland. Multiple bilateral renal cysts are again demonstrated. The previously demonstrated right renal calculus is no longer seen in the kidney. Interval mild-to-moderate dilatation of the right renal collecting system and ureter the level of a 4 mm calculus in the distal ureter, 8 mm proximal to the ureterovesical junction. Unremarkable left ureter and urinary bladder. Stomach/Bowel: Multiple sigmoid colon diverticula without evidence of diverticulitis. Diverticulum arising from the medial aspect of the 2nd portion of the duodenum and protruding into the head of the pancreas. No evidence of appendicitis. Unremarkable stomach. Vascular/Lymphatic: Atheromatous arterial calcifications  without aneurysm. No enlarged lymph nodes. Reproductive: Prostate is unremarkable. Other: Small bilateral inguinal hernias containing fat. Musculoskeletal: Lumbar and lower thoracic spine degenerative changes. Mild bilateral hip degenerative changes. Old right posterolateral 9th rib fracture with nonunion. IMPRESSION: 1. Interval 5.5 cm left adrenal mass with imaging features suspicious for an adrenal carcinoma or metastasis. Oncological workup is recommended. 2. 4 mm distal right ureteral calculus causing mild-to-moderate right hydronephrosis and hydroureter. 3. Colonic diverticulosis without evidence of diverticulitis. 4. Cholelithiasis without evidence cholecystitis. Electronically Signed   By: Claudie Revering M.D.   On: 11/04/2020 08:15   DG C-Arm 1-60 Min-No Report  Result Date: 11/07/2020 Fluoroscopy was utilized by the requesting physician.  No radiographic interpretation.      LAB RESULTS: Basic Metabolic Panel: Recent Labs  Lab 11/07/20 1035 11/08/20 0511 11/09/20 0529  NA 137 136 138  K 5.1 5.1 4.9  CL 104 107 107  CO2 26 21* 25  GLUCOSE 149* 184* 137*  BUN 51* 47* 48*  CREATININE  3.43* 2.91* 2.02*  CALCIUM 8.2* 7.9* 8.6*  MG 1.8  --   --    Liver Function Tests: Recent Labs  Lab 11/07/20 1035 11/08/20 0511  AST 18 14*  ALT 21 19  ALKPHOS 41 39  BILITOT 0.9 0.8  PROT 6.4* 6.0*  ALBUMIN 3.6 3.1*   Recent Labs  Lab 11/04/20 0615  LIPASE 23   No results for input(s): AMMONIA in the last 168 hours. CBC: Recent Labs  Lab 11/07/20 1035 11/08/20 0511  WBC 7.4 4.4  NEUTROABS 3.1  --   HGB 11.5* 11.4*  HCT 35.1* 35.1*  MCV 96.2 96.4  PLT 76* 66*   Cardiac Enzymes: No results for input(s): CKTOTAL, CKMB, CKMBINDEX, TROPONINI in the last 168 hours. BNP: Invalid input(s): POCBNP CBG: No results for input(s): GLUCAP in the last 168 hours.     Disposition and Follow-up: Discharge Instructions     Diet - low sodium heart healthy   Complete by: As directed     If the dressing is still on your incision site when you go home, remove it on the third day after your surgery date. Remove dressing if it begins to fall off, or if it is dirty or damaged before the third day.   Complete by: As directed    Increase activity slowly   Complete by: As directed         DISPOSITION: [facility]     Alexis Frock, MD Follow up.   Specialty: Urology Why: Office will call to arrange follow up for history of kidney stones and left adrenal mass. Contact information: [place] [address] [phone]         [name], MD Follow up in 1 week(s).   Specialty: Internal Medicine Why: obtain labs: BMET for kidney function Contact information: [address] [place] [facility] [place] [address] [phone]         Satira Sark, MD. Schedule an appointment as soon as possible for a visit in 2 week(s).   Specialty: Cardiology Why: for hospital follow-up Contact information: [place] [address] [phone]                  Time coordinating discharge:  35 minutes  Signed:   Estill Cotta M.D. [facility] 11/09/2020, 10:36 AM

## 2020-11-09 NOTE — Evaluation (Addendum)
Physical Therapy Evaluation Patient Details Name: [name] MRN: [id number] DOB: 1927/03/11 Today's Date: 11/09/2020   History of Present Illness  87 year old male with medical history of CKD stage III a, CAD s/p CABG, prostate cancer s/p cryotherapy, MDS presented with right lower quadrant abdominal pain for 4 days.  Found to have serum creatinine of 3.5, imaging showed right ureteral obstruction.  Urology was consulted.  He underwent cystoscopy with right retrograde pyelogram, right ureteroscopy with laser lithotripsy and insertion of right ureteral stent.  Clinical Impression  Patient is very pleasant. Patient eager to ambulate in order to go home. Patient ambulated x 160' using Rw. Gait is steady. Patient  has DME, does not  require HHPT at this time. Family supportive. Pt admitted with above diagnosis.  Pt currently with functional limitations due to the deficits listed below (see PT Problem List). Pt will benefit from skilled PT to increase their independence and safety with mobility to allow discharge to the venue listed below.    Patient noted with  3/4 dyspnea, expiratory wheeze when ambulating, SPO2 95%, HR 94      Follow Up Recommendations No PT follow up    Equipment Recommendations  None recommended by PT    Recommendations for Other Services       Precautions / Restrictions Precautions Precautions: Fall Restrictions Weight Bearing Restrictions: No      Mobility  Bed Mobility Overal bed mobility: Needs Assistance Bed Mobility: Sidelying to Sit   Sidelying to sit: Min assist       General bed mobility comments: min assist to sit upright    Transfers Overall transfer level: Needs assistance Equipment used: Rolling walker (2 wheeled) Transfers: Sit to/from Stand Sit to Stand: Min guard         General transfer comment: bed raised slightly,  rises without assistance, supported on  RW  Ambulation/Gait Ambulation/Gait assistance: Min guard Gait  Distance (Feet): 170 Feet Assistive device: Rolling walker (2 wheeled) Gait Pattern/deviations: Step-through pattern Gait velocity: decr Gait velocity interpretation: <1.31 ft/sec, indicative of household ambulator General Gait Details: gait is  steady, turns are steady.  Stairs            Wheelchair Mobility    Modified Rankin (Stroke Patients Only)       Balance Overall balance assessment: No apparent balance deficits (not formally assessed)                                           Pertinent Vitals/Pain Pain Assessment: No/denies pain    Home Living Family/patient expects to be discharged to:: Private residence Living Arrangements: Spouse/significant other;Children Available Help at Discharge: Available 24 hours/day Type of Home: House Home Access: Stairs to enter Entrance Stairs-Rails: Right Entrance Stairs-Number of Steps: 4 Home Layout: One level Home Equipment: Walker - 2 wheels;Cane - single point      Prior Function Level of Independence: Independent with assistive device(s)               Hand Dominance   Dominant Hand: Right    Extremity/Trunk Assessment   Upper Extremity Assessment Upper Extremity Assessment: Overall WFL for tasks assessed    Lower Extremity Assessment Lower Extremity Assessment: Overall WFL for tasks assessed    Cervical / Trunk Assessment Cervical / Trunk Assessment: Normal  Communication   Communication: No difficulties  Cognition Arousal/Alertness: Awake/alert Behavior During Therapy: [facility]  for tasks assessed/performed Overall Cognitive Status: Within Functional Limits for tasks assessed                                        General Comments      Exercises     Assessment/Plan    PT Assessment Patient needs continued PT services  PT Problem List Decreased strength;Decreased mobility;Decreased activity tolerance       PT Treatment Interventions DME  instruction;Therapeutic activities;Gait training;Therapeutic exercise;Patient/family education;Functional mobility training    PT Goals (Current goals can be found in the Care Plan section)  Acute Rehab PT Goals Patient Stated Goal: home PT Goal Formulation: With patient Time For Goal Achievement: 11/23/20 Potential to Achieve Goals: Good    Frequency Min 3X/week   Barriers to discharge        Co-evaluation               AM-PAC PT "6 Clicks" Mobility  Outcome Measure Help needed turning from your back to your side while in a flat bed without using bedrails?: A Little Help needed moving from lying on your back to sitting on the side of a flat bed without using bedrails?: A Little Help needed moving to and from a bed to a chair (including a wheelchair)?: A Little Help needed standing up from a chair using your arms (e.g., wheelchair or bedside chair)?: A Little Help needed to walk in hospital room?: A Little Help needed climbing 3-5 steps with a railing? : A Little 6 Click Score: 18    End of Session Equipment Utilized During Treatment: Gait belt Activity Tolerance: Patient tolerated treatment well Patient left: in chair;with call bell/phone within reach Nurse Communication: Mobility status PT Visit Diagnosis: Unsteadiness on feet (R26.81)    Time: 4492-0100 PT Time Calculation (min) (ACUTE ONLY): 16 min   Charges:   PT Evaluation $PT Eval Low Complexity: [place] Pager [phone] Office [phone]   Claretha Cooper 11/09/2020, 8:52 AM

## 2020-11-18 DIAGNOSIS — N202 Calculus of kidney with calculus of ureter: Secondary | ICD-10-CM | POA: Diagnosis not present

## 2020-11-18 DIAGNOSIS — I10 Essential (primary) hypertension: Secondary | ICD-10-CM | POA: Diagnosis not present

## 2020-11-18 DIAGNOSIS — N179 Acute kidney failure, unspecified: Secondary | ICD-10-CM | POA: Diagnosis not present

## 2020-11-18 DIAGNOSIS — Z09 Encounter for follow-up examination after completed treatment for conditions other than malignant neoplasm: Secondary | ICD-10-CM | POA: Diagnosis not present

## 2020-11-20 DIAGNOSIS — Z20822 Contact with and (suspected) exposure to covid-19: Secondary | ICD-10-CM | POA: Diagnosis not present

## 2020-12-02 ENCOUNTER — Encounter: Payer: Self-pay | Admitting: Student

## 2020-12-02 ENCOUNTER — Ambulatory Visit (INDEPENDENT_AMBULATORY_CARE_PROVIDER_SITE_OTHER): Payer: Medicare Other | Admitting: Student

## 2020-12-02 ENCOUNTER — Other Ambulatory Visit: Payer: Self-pay

## 2020-12-02 VITALS — BP 138/78 | HR 76 | Ht 64.0 in | Wt 162.0 lb

## 2020-12-02 DIAGNOSIS — N183 Chronic kidney disease, stage 3 unspecified: Secondary | ICD-10-CM | POA: Diagnosis not present

## 2020-12-02 DIAGNOSIS — R935 Abnormal findings on diagnostic imaging of other abdominal regions, including retroperitoneum: Secondary | ICD-10-CM

## 2020-12-02 DIAGNOSIS — I5022 Chronic systolic (congestive) heart failure: Secondary | ICD-10-CM | POA: Diagnosis not present

## 2020-12-02 DIAGNOSIS — I2510 Atherosclerotic heart disease of native coronary artery without angina pectoris: Secondary | ICD-10-CM

## 2020-12-02 DIAGNOSIS — I255 Ischemic cardiomyopathy: Secondary | ICD-10-CM

## 2020-12-02 DIAGNOSIS — I10 Essential (primary) hypertension: Secondary | ICD-10-CM | POA: Diagnosis not present

## 2020-12-02 DIAGNOSIS — N179 Acute kidney failure, unspecified: Secondary | ICD-10-CM

## 2020-12-02 NOTE — Patient Instructions (Signed)
Medication Instructions:  Your physician recommends that you continue on your current medications as directed. Please refer to the Current Medication list given to you today.  *If you need a refill on your cardiac medications before your next appointment, please call your pharmacy*   Lab Work: NONE   If you have labs (blood work) drawn today and your tests are completely normal, you will receive your results only by: . MyChart Message (if you have MyChart) OR . A paper copy in the mail If you have any lab test that is abnormal or we need to change your treatment, we will call you to review the results.   Testing/Procedures: NONE    Follow-Up: At [facility], you and your health needs are our priority.  As part of our continuing mission to provide you with exceptional heart care, we have created designated Provider Care Teams.  These Care Teams include your primary Cardiologist (physician) and Advanced Practice Providers (APPs -  Physician Assistants and Nurse Practitioners) who all work together to provide you with the care you need, when you need it.  We recommend signing up for the patient portal called "MyChart".  Sign up information is provided on this After Visit Summary.  MyChart is used to connect with patients for Virtual Visits (Telemedicine).  Patients are able to view lab/test results, encounter notes, upcoming appointments, etc.  Non-urgent messages can be sent to your provider as well.   To learn more about what you can do with MyChart, go to [email or web address].    Your next appointment:   6 month(s)  The format for your next appointment:   In Person  Provider:   Samuel McDowell, MD   Other Instructions Thank you for choosing [facility]!    

## 2020-12-02 NOTE — Progress Notes (Signed)
Cardiology Office Note    Date:  12/02/2020   ID:  CYLAR LAPA, DOB [id number], MRN [id number]  PCP:  [name], MD  Cardiologist: Rozann Lesches, MD    Chief Complaint  Patient presents with   Hospitalization Follow-up    History of Present Illness:    [name] is a 87 y.o. male with past medical history of CAD (s/p CABG in 2006 with LIMA-LAD, SVG-LCx and SVG-dRCA), HFrEF/ICM (EF at 45-50% by most recent echo in 2016), Medtronic ICD at [facility] (previously followed by Dr. Lovena Le and at [facility] in 2016 and not replaced due to improvement in his EF), history of prostate cancer, MDS, HTN and HLD who presents to the office today for hospital follow-up.  He was last examined by Dr. Domenic Polite in 09/2020 and denied any recent anginal symptoms at that time. He was continued on his current cardiac medications.  In the interim, he was admitted to [facility] in 10/2020 for evaluation of right lower quadrant abdominal pain. He was found to have a 5 mm right distal ureteral stone and an AKI. He did require a cystoscopy and insertion of a right ureteral stent by Dr. Tresa Moore on 11/07/2020. Was also noted to have a 5.5 cm left adrenal mass on CT imaging with oncological work-up recommended as an outpatient. His creatinine did peak at 3.55 during admission but had improved to 2.02 on 11/09/2020. Losartan 50 mg daily and Spironolactone 12.5 mg daily were held at discharge given his AKI.  In talking with the patient today, he reports overall doing well since his recent hospitalization.  His pelvic pain has resolved and urinary flow has remained stable. He did have repeat labs by his PCP the week following hospital discharge but unfortunately these are not yet available to [place] for review.  He reports his breathing has overall been stable and denies any specific dyspnea on exertion, orthopnea, PND or pitting edema. He did notice mild swelling along the top of his feet following hospital discharge but this has  resolved. No recent chest pain or palpitations.   Past Medical History:  Diagnosis Date   BPH (benign prostatic hypertrophy)    Chronic systolic heart failure (HCC)    Coronary artery disease    Multivessel status post CABG 8/06, LIMA-LAD, SVG-CFX, SVG-distal RCA   Essential hypertension    Hyperlipidemia    ICD (implantable cardiac defibrillator) in place    Medtronic   Ischemic cardiomyopathy    Myelodysplasia, low grade ([place]) 11/27/2015   Nephrolithiasis    Prostate cancer ([place])    Vitamin B 12 deficiency 07/27/2016    Past Surgical History:  Procedure Laterality Date   CARDIAC DEFIBRILLATOR PLACEMENT     COLONOSCOPY     COLONOSCOPY N/A 08/19/2013   Procedure: COLONOSCOPY;  Surgeon: Rogene Houston, MD;  [facility];  Service: Endoscopy;  Laterality: N/A;  52   CORONARY ARTERY BYPASS GRAFT     8/06 with left anterior descending artery, saphenous vein graft to circumflex, saphenous vein graft to distal right coronary artery   CYSTOSCOPY/URETEROSCOPY/HOLMIUM LASER/STENT PLACEMENT Right 11/07/2020   Procedure: CYSTOSCOPY/URETEROSCOPY/HOLMIUM LASER/STENT PLACEMENT;  Surgeon: Alexis Frock, MD;  Location: [facility];  Service: Urology;  Laterality: Right;   HERNIA REPAIR     POLYPECTOMY     PROSTATE SURGERY      Current Medications: Outpatient Medications Prior to Visit  Medication Sig Dispense Refill   acetaminophen (TYLENOL) 500 MG tablet Take 1 tablet (500 mg total)  by mouth every 6 (six) hours as needed for mild pain or fever. 30 tablet 0   aspirin 81 MG chewable tablet Chew 81 mg by mouth daily.     carvedilol (COREG) 6.25 MG tablet TAKE 1 TABLET(6.25 MG) BY MOUTH TWICE DAILY 180 tablet 3   cetirizine (ZYRTEC) 10 MG tablet Take 10 mg by mouth daily as needed for allergies.     Cholecalciferol (VITAMIN D3) 1000 UNITS CAPS Take 1 capsule by mouth daily.     cyanocobalamin (,VITAMIN B-12,) 1000 MCG/ML injection ADMINISTER 1 ML IN THE MUSCLE 1 TIME AS DIRECTED 1 mL 14    diphenhydrAMINE (BENADRYL) 25 MG tablet Take 25 mg by mouth at bedtime as needed for sleep.     nitroGLYCERIN (NITROSTAT) 0.4 MG SL tablet Place 1 tablet (0.4 mg total) under the tongue every 5 (five) minutes as needed. 25 tablet 3   simvastatin (ZOCOR) 40 MG tablet TAKE 1 TABLET BY MOUTH EVERY DAY IN THE EVENING 90 tablet 2   vitamin C (ASCORBIC ACID) 500 MG tablet Take 500 mg by mouth daily.       HYDROcodone-acetaminophen (NORCO/VICODIN) 5-325 MG tablet Take 1 tablet by mouth every 6 (six) hours as needed for severe pain. (Patient not taking: Reported on 12/02/2020) 12 tablet 0   No facility-administered medications prior to visit.     Allergies:   Codeine   Social History   Socioeconomic History   Marital status: Married    Spouse name: Not on file   Number of children: Not on file   Years of education: Not on file   Highest education level: Not on file  Occupational History   Occupation: Retired    Fish farm manager: RETIRED  Tobacco Use   Smoking status: Former    Packs/day: 0.25    Years: 44.00    Pack years: 11.00    Types: Cigarettes    Quit date: 05/14/1992    Years since quitting: 28.5   Smokeless tobacco: Never  Vaping Use   Vaping Use: Never used  Substance and Sexual Activity   Alcohol use: No   Drug use: No   Sexual activity: Not on file    Comment: married 9 years  Other Topics Concern   Not on file  Social History Narrative   Not on file   Social Determinants of Health   Financial Resource Strain: Not on file  Food Insecurity: Not on file  Transportation Needs: Not on file  Physical Activity: Not on file  Stress: Not on file  Social Connections: Not on file     Family History:  The patient's family history includes Colon cancer in his father; Coronary artery disease in an other family member.   Review of Systems:    Please see the history of present illness.     All other systems reviewed and are otherwise negative except as noted  above.   Physical Exam:    VS:  BP 138/78   Pulse 76   Ht '5\' 4"'$  (1.626 m)   Wt 162 lb (73.5 kg)   SpO2 95%   BMI 27.81 kg/m    General: Pleasant elderly male appearing in no acute distress. Head: Normocephalic, atraumatic. Neck: No carotid bruits. JVD not elevated.  Lungs: Respirations regular and unlabored, without wheezes or rales.  Heart: Regular rate and rhythm. No S3 or S4.  No murmur, no rubs, or gallops appreciated. Abdomen: Appears non-distended. No obvious abdominal masses. Msk:  Strength and tone appear normal for  age. No obvious joint deformities or effusions. Extremities: No clubbing or cyanosis. No pitting edema.  Distal pedal pulses are 2+ bilaterally. Neuro: Alert and oriented X 3. Moves all extremities spontaneously. No focal deficits noted. Psych:  Responds to questions appropriately with a normal affect. Skin: No rashes or lesions noted  Wt Readings from Last 3 Encounters:  12/02/20 162 lb (73.5 kg)  11/06/20 166 lb (75.3 kg)  11/04/20 166 lb (75.3 kg)     Studies/Labs Reviewed:   EKG:  EKG is not ordered today.  Recent Labs: 11/07/2020: Magnesium 1.8 11/08/2020: ALT 19; Hemoglobin 11.4; Platelets 66 11/09/2020: BUN 48; Creatinine, Ser 2.02; Potassium 4.9; Sodium 138   Lipid Panel    Component Value Date/Time   CHOL 132 06/09/2019 0808   TRIG 111 06/09/2019 0808   HDL 35 (L) 06/09/2019 0808   CHOLHDL 3.8 06/09/2019 0808   VLDL 25.0 09/09/2014 1123   LDLCALC 78 06/09/2019 0808    Additional studies/ records that were reviewed today include:   Echocardiogram: 07/2014   Assessment:    1. Coronary artery disease involving native coronary artery of native heart without angina pectoris   2. Chronic systolic heart failure ([place])   3. Ischemic cardiomyopathy   4. Essential hypertension   5. Acute renal failure superimposed on stage 3 chronic kidney disease, unspecified acute renal failure type, unspecified whether stage 3a or 3b CKD ([place])   6.  Abnormal abdominal CT scan      Plan:   In order of problems listed above:  1. CAD - He is s/p CABG in 2006 with LIMA-LAD, SVG-LCx and SVG-dRCA. He has overall preferred conservative management and has not undergone recent ischemic evaluation. He does remain active at baseline for his age and denies any recent chest pain or dyspnea on exertion. - Continue ASA 81 mg daily, Coreg 6.25 mg daily and Simvastatin 40 mg daily.  2. HFrEF/ICM - His EF had improved to 45-50% by most recent echo in 2016 and he does have a Medtronic ICD but this has been at [facility] since 2016 and not replaced due to improvement in his EF and age.  - He denies any recent orthopnea, PND or pitting edema. He is currently on Coreg 6.25 mg twice daily as both Losartan and Spironolactone were held at the time of hospital discharge given his AKI. I will request most recent labs from his PCP. If his renal function has normalized, would plan to add back Losartan. If still above baseline, would recommend obtaining a follow-up BMET before restarting.  3. HTN - His BP is well controlled at 138/78 during today's visit. Continue Coreg at current dosing.  Will plan to add back Losartan as outlined above pending reassessment of renal function.  4. AKI - Creatinine peaked at 3.55 during his admission in the setting of his kidney stone and had improved to 2.02 at discharge. Will request most recent labs from his PCP.    5. Abnormal CT  - Recent CT showed a 5.5 cm left adrenal mass. He does have follow-up with Oncology scheduled for next month to determine if further evaluation is indicated.    Medication Adjustments/Labs and Tests Ordered: Current medicines are reviewed at length with the patient today.  Concerns regarding medicines are outlined above.  Medication changes, Labs and Tests ordered today are listed in the Patient Instructions below. Patient Instructions  Medication Instructions:  Your physician recommends that you continue  on your current medications as directed. Please refer to the  Current Medication list given to you today.  *If you need a refill on your cardiac medications before your next appointment, please call your pharmacy*   Lab Work: NONE   If you have labs (blood work) drawn today and your tests are completely normal, you will receive your results only by: [employer] (if you have MyChart) OR A paper copy in the mail If you have any lab test that is abnormal or we need to change your treatment, we will call you to review the results.   Testing/Procedures: NONE    Follow-Up: At [facility], you and your health needs are our priority.  As part of our continuing mission to provide you with exceptional heart care, we have created designated Provider Care Teams.  These Care Teams include your primary Cardiologist (physician) and Advanced Practice Providers (APPs -  Physician Assistants and Nurse Practitioners) who all work together to provide you with the care you need, when you need it.  We recommend signing up for the patient portal called "MyChart".  Sign up information is provided on this After Visit Summary.  MyChart is used to connect with patients for Virtual Visits (Telemedicine).  Patients are able to view lab/test results, encounter notes, upcoming appointments, etc.  Non-urgent messages can be sent to your provider as well.   To learn more about what you can do with MyChart, go to [email or web address].    Your next appointment:   6 month(s)  The format for your next appointment:   In Person  Provider:   Rozann Lesches, MD   Other Instructions Thank you for choosing [facility]!     Signed, Erma Heritage, PA-C  12/02/2020 4:55 PM    [place] [address] [place], [place] [address] Phone: [phone] Fax: [phone]

## 2020-12-07 DIAGNOSIS — Z23 Encounter for immunization: Secondary | ICD-10-CM | POA: Diagnosis not present

## 2020-12-14 ENCOUNTER — Encounter: Payer: Self-pay | Admitting: *Deleted

## 2020-12-16 ENCOUNTER — Inpatient Hospital Stay (HOSPITAL_COMMUNITY)
Admission: EM | Admit: 2020-12-16 | Discharge: 2020-12-20 | DRG: 812 | Disposition: A | Discharge status: Home / Self Care | Payer: Medicare Other | Attending: Cardiology | Admitting: Cardiology

## 2020-12-16 ENCOUNTER — Encounter (HOSPITAL_COMMUNITY): Payer: Self-pay | Admitting: Emergency Medicine

## 2020-12-16 ENCOUNTER — Emergency Department (HOSPITAL_COMMUNITY): Payer: Medicare Other

## 2020-12-16 ENCOUNTER — Telehealth: Payer: Self-pay | Admitting: *Deleted

## 2020-12-16 ENCOUNTER — Other Ambulatory Visit: Payer: Self-pay

## 2020-12-16 DIAGNOSIS — Z95810 Presence of automatic (implantable) cardiac defibrillator: Secondary | ICD-10-CM | POA: Diagnosis not present

## 2020-12-16 DIAGNOSIS — I200 Unstable angina: Secondary | ICD-10-CM

## 2020-12-16 DIAGNOSIS — Z87898 Personal history of other specified conditions: Secondary | ICD-10-CM

## 2020-12-16 DIAGNOSIS — Z20822 Contact with and (suspected) exposure to covid-19: Secondary | ICD-10-CM | POA: Diagnosis not present

## 2020-12-16 DIAGNOSIS — I10 Essential (primary) hypertension: Secondary | ICD-10-CM | POA: Diagnosis not present

## 2020-12-16 DIAGNOSIS — I255 Ischemic cardiomyopathy: Secondary | ICD-10-CM | POA: Diagnosis not present

## 2020-12-16 DIAGNOSIS — Z87442 Personal history of urinary calculi: Secondary | ICD-10-CM | POA: Diagnosis not present

## 2020-12-16 DIAGNOSIS — I110 Hypertensive heart disease with heart failure: Secondary | ICD-10-CM | POA: Diagnosis not present

## 2020-12-16 DIAGNOSIS — D469 Myelodysplastic syndrome, unspecified: Principal | ICD-10-CM | POA: Diagnosis present

## 2020-12-16 DIAGNOSIS — Z8546 Personal history of malignant neoplasm of prostate: Secondary | ICD-10-CM | POA: Diagnosis not present

## 2020-12-16 DIAGNOSIS — R079 Chest pain, unspecified: Secondary | ICD-10-CM | POA: Diagnosis not present

## 2020-12-16 DIAGNOSIS — I5020 Unspecified systolic (congestive) heart failure: Secondary | ICD-10-CM | POA: Diagnosis not present

## 2020-12-16 DIAGNOSIS — I2582 Chronic total occlusion of coronary artery: Secondary | ICD-10-CM | POA: Diagnosis present

## 2020-12-16 DIAGNOSIS — Z7982 Long term (current) use of aspirin: Secondary | ICD-10-CM | POA: Diagnosis not present

## 2020-12-16 DIAGNOSIS — R1111 Vomiting without nausea: Secondary | ICD-10-CM | POA: Diagnosis not present

## 2020-12-16 DIAGNOSIS — E7849 Other hyperlipidemia: Secondary | ICD-10-CM | POA: Diagnosis present

## 2020-12-16 DIAGNOSIS — I25110 Atherosclerotic heart disease of native coronary artery with unstable angina pectoris: Secondary | ICD-10-CM | POA: Diagnosis not present

## 2020-12-16 DIAGNOSIS — I472 Ventricular tachycardia: Secondary | ICD-10-CM | POA: Diagnosis present

## 2020-12-16 DIAGNOSIS — E785 Hyperlipidemia, unspecified: Secondary | ICD-10-CM | POA: Diagnosis present

## 2020-12-16 DIAGNOSIS — I252 Old myocardial infarction: Secondary | ICD-10-CM

## 2020-12-16 DIAGNOSIS — I2510 Atherosclerotic heart disease of native coronary artery without angina pectoris: Secondary | ICD-10-CM | POA: Diagnosis not present

## 2020-12-16 DIAGNOSIS — I5022 Chronic systolic (congestive) heart failure: Secondary | ICD-10-CM | POA: Diagnosis present

## 2020-12-16 DIAGNOSIS — Z885 Allergy status to narcotic agent status: Secondary | ICD-10-CM | POA: Diagnosis not present

## 2020-12-16 DIAGNOSIS — Z8249 Family history of ischemic heart disease and other diseases of the circulatory system: Secondary | ICD-10-CM | POA: Diagnosis not present

## 2020-12-16 DIAGNOSIS — Z79899 Other long term (current) drug therapy: Secondary | ICD-10-CM

## 2020-12-16 DIAGNOSIS — I4891 Unspecified atrial fibrillation: Secondary | ICD-10-CM | POA: Diagnosis not present

## 2020-12-16 DIAGNOSIS — Z951 Presence of aortocoronary bypass graft: Secondary | ICD-10-CM

## 2020-12-16 DIAGNOSIS — Z800 Family history of malignant neoplasm of digestive organs: Secondary | ICD-10-CM | POA: Diagnosis not present

## 2020-12-16 DIAGNOSIS — Z87891 Personal history of nicotine dependence: Secondary | ICD-10-CM

## 2020-12-16 DIAGNOSIS — N400 Enlarged prostate without lower urinary tract symptoms: Secondary | ICD-10-CM | POA: Diagnosis present

## 2020-12-16 DIAGNOSIS — I25119 Atherosclerotic heart disease of native coronary artery with unspecified angina pectoris: Secondary | ICD-10-CM

## 2020-12-16 DIAGNOSIS — R0789 Other chest pain: Secondary | ICD-10-CM | POA: Diagnosis not present

## 2020-12-16 DIAGNOSIS — I452 Bifascicular block: Secondary | ICD-10-CM | POA: Diagnosis present

## 2020-12-16 DIAGNOSIS — S2231XA Fracture of one rib, right side, initial encounter for closed fracture: Secondary | ICD-10-CM | POA: Diagnosis not present

## 2020-12-16 LAB — CBC
HCT: 38.9 % — ABNORMAL LOW (ref 39.0–52.0)
Hemoglobin: 12.4 g/dL — ABNORMAL LOW (ref 13.0–17.0)
MCH: 31 pg (ref 26.0–34.0)
MCHC: 31.9 g/dL (ref 30.0–36.0)
MCV: 97.3 fL (ref 80.0–100.0)
Platelets: 133 10*3/uL — ABNORMAL LOW (ref 150–400)
RBC: 4 MIL/uL — ABNORMAL LOW (ref 4.22–5.81)
RDW: 13 % (ref 11.5–15.5)
WBC: 3.8 10*3/uL — ABNORMAL LOW (ref 4.0–10.5)
nRBC: 0 % (ref 0.0–0.2)

## 2020-12-16 LAB — BASIC METABOLIC PANEL
Anion gap: 4 — ABNORMAL LOW (ref 5–15)
BUN: 17 mg/dL (ref 8–23)
CO2: 30 mmol/L (ref 22–32)
Calcium: 8.9 mg/dL (ref 8.9–10.3)
Chloride: 104 mmol/L (ref 98–111)
Creatinine, Ser: 1.26 mg/dL — ABNORMAL HIGH (ref 0.61–1.24)
GFR, Estimated: 54 mL/min — ABNORMAL LOW (ref >60)
Glucose, Bld: 146 mg/dL — ABNORMAL HIGH (ref 70–99)
Potassium: 3.9 mmol/L (ref 3.5–5.1)
Sodium: 138 mmol/L (ref 135–145)

## 2020-12-16 LAB — RESP PANEL BY RT-PCR (FLU A&B, COVID) ARPGX2
Influenza A by PCR: NEGATIVE
Influenza B by PCR: NEGATIVE
SARS Coronavirus 2 by RT PCR: NEGATIVE

## 2020-12-16 LAB — CBG MONITORING, ED: Glucose-Capillary: 129 mg/dL — ABNORMAL HIGH (ref 70–99)

## 2020-12-16 LAB — TROPONIN I (HIGH SENSITIVITY)
Troponin I (High Sensitivity): 24 ng/L — ABNORMAL HIGH (ref <18)
Troponin I (High Sensitivity): 26 ng/L — ABNORMAL HIGH (ref <18)

## 2020-12-16 MED ORDER — NITROGLYCERIN 0.4 MG SL SUBL: 0.4000 mg | SUBLINGUAL_TABLET | SUBLINGUAL | Status: DC | PRN

## 2020-12-16 MED ORDER — NITROGLYCERIN 2 % TD OINT
1.0000 [in_us] | TOPICAL_OINTMENT | Freq: Once | TRANSDERMAL | Status: AC
  Administered 2020-12-16: 1 [in_us] via TOPICAL
  Filled 2020-12-16: qty 1

## 2020-12-16 MED ORDER — ONDANSETRON HCL 4 MG/2ML IJ SOLN: 4.0000 mg | Freq: Four times a day (QID) | INTRAMUSCULAR | Status: DC | PRN

## 2020-12-16 MED ORDER — LOSARTAN POTASSIUM 25 MG PO TABS: 25.0000 mg | ORAL_TABLET | Freq: Every day | ORAL | 3 refills | Status: DC

## 2020-12-16 MED ORDER — CARVEDILOL 6.25 MG PO TABS
6.2500 mg | ORAL_TABLET | Freq: Two times a day (BID) | ORAL | Status: DC
  Administered 2020-12-17 – 2020-12-20 (×7): 6.25 mg via ORAL
  Filled 2020-12-16 (×7): qty 1

## 2020-12-16 MED ORDER — SIMVASTATIN 20 MG PO TABS: 40.0000 mg | ORAL_TABLET | Freq: Every day | ORAL | Status: DC

## 2020-12-16 MED ORDER — HEPARIN BOLUS VIA INFUSION
4000.0000 [IU] | Freq: Once | INTRAVENOUS | Status: AC
  Administered 2020-12-16: 4000 [IU] via INTRAVENOUS

## 2020-12-16 MED ORDER — ASPIRIN EC 81 MG PO TBEC
81.0000 mg | DELAYED_RELEASE_TABLET | Freq: Every day | ORAL | Status: DC
  Administered 2020-12-17 – 2020-12-20 (×3): 81 mg via ORAL
  Filled 2020-12-16 (×4): qty 1

## 2020-12-16 MED ORDER — HEPARIN (PORCINE) 25000 UT/250ML-% IV SOLN
1200.0000 [IU]/h | INTRAVENOUS | Status: DC
  Administered 2020-12-16: 850 [IU]/h via INTRAVENOUS
  Administered 2020-12-17: 1100 [IU]/h via INTRAVENOUS
  Administered 2020-12-18: 1200 [IU]/h via INTRAVENOUS
  Filled 2020-12-16 (×4): qty 250

## 2020-12-16 MED ORDER — ACETAMINOPHEN 500 MG PO TABS: 500.0000 mg | ORAL_TABLET | Freq: Four times a day (QID) | ORAL | Status: DC | PRN

## 2020-12-16 NOTE — ED Provider Notes (Signed)
I discussed with Dr. [name], and cardiology will admit   Luke Gambler, MD 12/16/20 1535

## 2020-12-16 NOTE — Telephone Encounter (Signed)
-----   Message from Erma Heritage, [place] sent at 12/16/2020  9:31 AM EDT ----- Please let the patient know I received labs from his PCP and his kidney function has improved with creatinine now down to 1.28. Would recommend that he restart Losartan but at a lower dose of 25 mg daily. Obtain a repeat BMET in 2-3 weeks to make sure renal function and electrolytes remain stable.

## 2020-12-16 NOTE — H&P (Signed)
Cardiology Consultation:   Patient ID: [name] MRN: [id number]; DOB: [birth year]  Admit date: 12/16/2020 Date of Consult: 12/16/2020  PCP:  [name], MD   [facility] Providers Cardiologist:  Rozann Lesches, MD   {     Patient Profile:   [name] is a 87 y.o. male with a hx of CAD with prior CABG who is being seen 12/16/2020 for the evaluation of chest pain at the request of Dr Sabra Heck.  History of Present Illness:   [name] 87 yo male history of CAD with CAGB in 2006 LIMA-LAD, SVG-LCx and SVG-dRCA, HTN, HL, chronic systolic HF EF at [id number] by most recent echo in 2016, AICD, kidney stone with admit 10/2020 with related AKI, adrenal mass, presents with chest pain  Reports suddent onset of chest tightness around 930AM this morning at rest. Started out light discomfort left chest, escalated to 8-9/10 in severity. No SOB but significant diaphoresis, wife said he was dripping wet. Took NG x 1 without much improvement, called EMS. Pain lasted about 30 minutes, has resolved.    K 3.9 CR 1.26 WBC 3.8 Hgb 12.4 Plt 133 Hstrop 24-->26 EKG SR, RBBB/LAFB, no specific ischemic changes COVID neg CXR no acute process    Past Medical History:  Diagnosis Date   BPH (benign prostatic hypertrophy)    Chronic systolic heart failure (HCC)    Coronary artery disease    Multivessel status post CABG 8/06, LIMA-LAD, SVG-CFX, SVG-distal RCA   Essential hypertension    Hyperlipidemia    ICD (implantable cardiac defibrillator) in place    Medtronic   Ischemic cardiomyopathy    Myelodysplasia, low grade ([place]) 11/27/2015   Nephrolithiasis    Prostate cancer ([place])    Vitamin B 12 deficiency 07/27/2016    Past Surgical History:  Procedure Laterality Date   CARDIAC DEFIBRILLATOR PLACEMENT     COLONOSCOPY     COLONOSCOPY N/A 08/19/2013   Procedure: COLONOSCOPY;  Surgeon: Rogene Houston, MD;  [facility];  Service: Endoscopy;  Laterality: N/A;  61   CORONARY ARTERY  BYPASS GRAFT     8/06 with left anterior descending artery, saphenous vein graft to circumflex, saphenous vein graft to distal right coronary artery   CYSTOSCOPY/URETEROSCOPY/HOLMIUM LASER/STENT PLACEMENT Right 11/07/2020   Procedure: CYSTOSCOPY/URETEROSCOPY/HOLMIUM LASER/STENT PLACEMENT;  Surgeon: Alexis Frock, MD;  Location: [facility];  Service: Urology;  Laterality: Right;   HERNIA REPAIR     POLYPECTOMY     PROSTATE SURGERY        Inpatient Medications: Scheduled Meds:  Continuous Infusions:  PRN Meds:   Allergies:    Allergies  Allergen Reactions   Codeine Nausea And Vomiting    Social History:   Social History   Socioeconomic History   Marital status: Married    Spouse name: Not on file   Number of children: Not on file   Years of education: Not on file   Highest education level: Not on file  Occupational History   Occupation: Retired    Fish farm manager: RETIRED  Tobacco Use   Smoking status: Former    Packs/day: 0.25    Years: 44.00    Pack years: 11.00    Types: Cigarettes    Quit date: 05/14/1992    Years since quitting: 28.6   Smokeless tobacco: Never  Vaping Use   Vaping Use: Never used  Substance and Sexual Activity   Alcohol use: No   Drug use: No   Sexual activity: Not on file  Comment: married 63 years  Other Topics Concern   Not on file  Social History Narrative   Not on file   Social Determinants of Health   Financial Resource Strain: Not on file  Food Insecurity: Not on file  Transportation Needs: Not on file  Physical Activity: Not on file  Stress: Not on file  Social Connections: Not on file  Intimate Partner Violence: Not on file    Family History:    Family History  Problem Relation Age of Onset   Colon cancer Father    Coronary artery disease Other      ROS:  Please see the history of present illness.  All other ROS reviewed and negative.     Physical Exam/Data:   Vitals:   12/16/20 1300 12/16/20 1330 12/16/20 1400  12/16/20 1430  BP: (!) 141/80 (!) 145/80 (!) 146/71 (!) 142/68  Pulse: 67 71 70 73  Resp: (!) '23 15 17 14  '$ Temp:      TempSrc:      SpO2: 97% 95% 93% 95%  Weight:      Height:       No intake or output data in the 24 hours ending 12/16/20 1520 Last 3 Weights 12/16/2020 12/02/2020 11/06/2020  Weight (lbs) 162 lb 162 lb 166 lb  Weight (kg) 73.483 kg 73.483 kg 75.297 kg     Body mass index is 27.81 kg/m.  General:  Well nourished, well developed, in no acute distress HEENT: normal Lymph: no adenopathy Neck: no JVD Endocrine:  No thryomegaly Vascular: No carotid bruits; FA pulses 2+ bilaterally without bruits  Cardiac:  normal S1, S2; RRR; no murmur  Lungs:  clear to auscultation bilaterally, no wheezing, rhonchi or rales  Abd: soft, nontender, no hepatomegaly  Ext: no edema Musculoskeletal:  No deformities, BUE and BLE strength normal and equal Skin: warm and dry  Neuro:  CNs 2-12 intact, no focal abnormalities noted Psych:  Normal affect     Laboratory Data:  High Sensitivity Troponin:   Recent Labs  Lab 12/16/20 1155 12/16/20 1340  TROPONINIHS 24* 26*     Chemistry Recent Labs  Lab 12/16/20 1155  NA 138  K 3.9  CL 104  CO2 30  GLUCOSE 146*  BUN 17  CREATININE 1.26*  CALCIUM 8.9  GFRNONAA 54*  ANIONGAP 4*    No results for input(s): PROT, ALBUMIN, AST, ALT, ALKPHOS, BILITOT in the last 168 hours. Hematology Recent Labs  Lab 12/16/20 1155  WBC 3.8*  RBC 4.00*  HGB 12.4*  HCT 38.9*  MCV 97.3  MCH 31.0  MCHC 31.9  RDW 13.0  PLT 133*   BNPNo results for input(s): BNP, PROBNP in the last 168 hours.  DDimer No results for input(s): DDIMER in the last 168 hours.   Radiology/Studies:  DG Chest Portable 1 View  Result Date: 12/16/2020 CLINICAL DATA:  Chest pain EXAM: PORTABLE CHEST 1 VIEW COMPARISON:  10/31/2018 FINDINGS: Cardiac enlargement. Prior CABG. AICD in good position and unchanged. Negative for heart failure. Lungs are clear without infiltrate  or effusion. Chronic right rib fractures IMPRESSION: No active disease. Electronically Signed   By: Franchot Gallo M.D.   On: 12/16/2020 12:56     Assessment and Plan:   1.History of CAD/ Chest pain - history of CAD, prior CABG in 2006 - presents with chest pain - trops are flat thus far, ekg without specific ischemic changes - fairly worrisome story of left sided discomfort with significant diaophoresis, wife said he  was dripping wet. Despite mild objective findings thus far high concern for unstable angina.  - start hep gtt, cycle enzymes, obtain echo. Pending further data and clinical course consider stress testing vs cath, his story alone may be enough to warrant cath but will see what additional data shows  - start hep gtt, continue home ASA and statin.  - npo tonight.   Since no cardiology team of stress testing at [facility] over the weekend, we will arrange transfer to [facility].    2. History of kidney stones/Recent AKI - Cr significantly improved, with potential cath would continue to hold his ARB and aldactone.   Risk Assessment/Risk Scores:    For questions or updates, please contact [place] Please consult [email or web address] for contact info under    Signed, Carlyle Dolly, MD  12/16/2020 3:20 PM

## 2020-12-16 NOTE — ED Provider Notes (Signed)
**[name] De-Identified via Obfuscation** Luke [name]   CSN: [id number] Arrival date & time: 12/16/20  1141     History Chief Complaint  Patient presents with   Chest Pain    Luke [name] is a 87 y.o. male.   Chest Pain  This patient is a 87 year old male, he has a history of ischemic cardiomyopathy status post bypass grafting in 2006, he is known to have multivessel coronary disease and congestive heart failure.  He presents to the hospital today with a complaint of chest pain which started this morning about 1 hour ago, it is left-sided, throbbing, seems to be persistent was associated with nausea and one episode of vomiting as well as severe diaphoresis.  That seems to have improved but the chest pain is still residing.  He is not actively short of breath, he is not coughing or febrile, he has not had any swelling of his legs.  He states this does feel similar to the way he felt in the past with prior coronary events.  He follows with Bernerd Pho and the cardiology group here at this hospital  When he called 911 this morning he was advised to to a 325 mg aspirin which she did, he also took 1 nitroglycerin which did not help at all.  Past Medical History:  Diagnosis Date   BPH (benign prostatic hypertrophy)    Chronic systolic heart failure (HCC)    Coronary artery disease    Multivessel status post CABG 8/06, LIMA-LAD, SVG-CFX, SVG-distal RCA   Essential hypertension    Hyperlipidemia    ICD (implantable cardiac defibrillator) in place    [employer]   Ischemic cardiomyopathy    Myelodysplasia, low grade ([place]) 11/27/2015   Nephrolithiasis    Prostate cancer ([place])    Vitamin B 12 deficiency 07/27/2016    Patient Active Problem List   Diagnosis Date Noted   Acute renal failure superimposed on stage 3 chronic kidney disease, unspecified acute renal failure type, unspecified whether stage 3a or 3b CKD ([place]) 11/07/2020   AKI (acute kidney injury) ([place]) 11/07/2020   Acute renal  failure (ARF) ([place]) 11/07/2020   Gout involving toe 05/26/2019   Vitamin B 12 deficiency 07/27/2016   Myelodysplasia, low grade ([place]) 11/27/2015   Other primary cardiomyopathies 08/07/2011   Carotid stenosis 07/26/2011   Automatic implantable cardioverter-defibrillator in situ 07/26/2009   CAD, AUTOLOGOUS BYPASS GRAFT 08/24/2008   PROSTATE CANCER 08/03/2008   Other hyperlipidemia 08/03/2008   Essential hypertension 08/03/2008   MYOCARDIAL INFARCTION, HX OF 08/03/2008   Coronary atherosclerosis 08/03/2008   CARDIOMYOPATHY, ISCHEMIC [id number]   SYSTOLIC HEART FAILURE, CHRONIC 08/03/2008   NEPHROLITHIASIS, HX OF 08/03/2008   POSTSURGICAL AORTOCORONARY BYPASS STATUS 08/03/2008    Past Surgical History:  Procedure Laterality Date   CARDIAC DEFIBRILLATOR PLACEMENT     COLONOSCOPY     COLONOSCOPY N/A 08/19/2013   Procedure: COLONOSCOPY;  Surgeon: Rogene Houston, MD;  Location: [facility];  Service: Endoscopy;  Laterality: N/A;  90   CORONARY ARTERY BYPASS GRAFT     8/06 with left anterior descending artery, saphenous vein graft to circumflex, saphenous vein graft to distal right coronary artery   CYSTOSCOPY/URETEROSCOPY/HOLMIUM LASER/STENT PLACEMENT Right 11/07/2020   Procedure: CYSTOSCOPY/URETEROSCOPY/HOLMIUM LASER/STENT PLACEMENT;  Surgeon: Alexis Frock, MD;  Location: [facility];  Service: Urology;  Laterality: Right;   HERNIA REPAIR     POLYPECTOMY     PROSTATE SURGERY         Family History  Problem Relation Age of  Onset   Colon cancer Father    Coronary artery disease Other     Social History   Tobacco Use   Smoking status: Former    Packs/day: 0.25    Years: 44.00    Pack years: 11.00    Types: Cigarettes    Quit date: 05/14/1992    Years since quitting: 28.6   Smokeless tobacco: Never  Vaping Use   Vaping Use: Never used  Substance Use Topics   Alcohol use: No   Drug use: No    Home Medications Prior to Admission medications   Medication Sig Start Date  End Date Taking? Authorizing Provider  acetaminophen (TYLENOL) 500 MG tablet Take 1 tablet (500 mg total) by mouth every 6 (six) hours as needed for mild pain or fever. 07/14/15   Carmin Muskrat, MD  aspirin 81 MG chewable tablet Chew 81 mg by mouth daily.    [provider]  carvedilol (COREG) 6.25 MG tablet TAKE 1 TABLET(6.25 MG) BY MOUTH TWICE DAILY 09/29/20   Satira Sark, MD  cetirizine (ZYRTEC) 10 MG tablet Take 10 mg by mouth daily as needed for allergies.    [provider]  Cholecalciferol (VITAMIN D3) 1000 UNITS CAPS Take 1 capsule by mouth daily.    [provider]  cyanocobalamin (,VITAMIN B-12,) 1000 MCG/ML injection ADMINISTER 1 ML IN THE MUSCLE 1 TIME AS DIRECTED 12/31/19   Derek Jack, MD  diphenhydrAMINE (BENADRYL) 25 MG tablet Take 25 mg by mouth at bedtime as needed for sleep.    [provider]  HYDROcodone-acetaminophen (NORCO/VICODIN) 5-325 MG tablet Take 1 tablet by mouth every 6 (six) hours as needed for severe pain. Patient not taking: Reported on 12/02/2020 11/04/20   Truddie Hidden, MD  losartan (COZAAR) 25 MG tablet Take 1 tablet (25 mg total) by mouth daily. 12/16/20 03/16/21  Strader, Fransisco Hertz, PA-C  nitroGLYCERIN (NITROSTAT) 0.4 MG SL tablet Place 1 tablet (0.4 mg total) under the tongue every 5 (five) minutes as needed. 06/08/19   Imogene Burn, PA-C  simvastatin (ZOCOR) 40 MG tablet TAKE 1 TABLET BY MOUTH EVERY DAY IN THE EVENING 06/13/20   Satira Sark, MD  vitamin C (ASCORBIC ACID) 500 MG tablet Take 500 mg by mouth daily.      [provider]    Allergies    Codeine  Review of Systems   Review of Systems  Cardiovascular:  Positive for chest pain.  All other systems reviewed and are negative.  Physical Exam Updated Vital Signs Ht 1.626 m ('5\' 4"'$ )   Wt 73.5 kg   BMI 27.81 kg/m   Physical Exam Vitals and nursing [name] reviewed.  Constitutional:      General: He is not in acute  distress.    Appearance: He is well-developed.  HENT:     Head: Normocephalic and atraumatic.     Mouth/Throat:     Pharynx: No oropharyngeal exudate.  Eyes:     General: No scleral icterus.       Right eye: No discharge.        Left eye: No discharge.     Conjunctiva/sclera: Conjunctivae normal.     Pupils: Pupils are equal, round, and reactive to light.  Neck:     Thyroid: No thyromegaly.     Vascular: No JVD.  Cardiovascular:     Rate and Rhythm: Normal rate and regular rhythm.     Heart sounds: Normal heart sounds. No murmur heard.   No  friction rub. No gallop.  Pulmonary:     Effort: Pulmonary effort is normal. No respiratory distress.     Breath sounds: Normal breath sounds. No wheezing or rales.  Abdominal:     General: Bowel sounds are normal. There is no distension.     Palpations: Abdomen is soft. There is no mass.     Tenderness: There is no abdominal tenderness.  Musculoskeletal:        General: No tenderness. Normal range of motion.     Cervical back: Normal range of motion and neck supple.     Right lower leg: No edema.     Left lower leg: No edema.  Lymphadenopathy:     Cervical: No cervical adenopathy.  Skin:    General: Skin is warm and dry.     Findings: No erythema or rash.  Neurological:     Mental Status: He is alert.     Coordination: Coordination normal.  Psychiatric:        Behavior: Behavior normal.    ED Results / Procedures / Treatments   Labs (all labs ordered are listed, but only abnormal results are displayed) Labs Reviewed  BASIC METABOLIC PANEL  CBG MONITORING, ED  TROPONIN I (HIGH SENSITIVITY)    EKG None  Radiology No results found.  Procedures Procedures   Medications Ordered in ED Medications  nitroGLYCERIN (NITROGLYN) 2 % ointment 1 inch (has no administration in time range)    ED Course  I have reviewed the triage vital signs and the nursing notes.  Pertinent labs & imaging results that were available during my  care of the patient were reviewed by me and considered in my medical decision making (see chart for details).  Clinical Course as of 12/17/20 [id number]  Fri Dec 16, 2020  1338 Consulted with Dr. [name] of Cardiology who will see patient in consultation to direct disposition [BM]    Clinical Course User Index [BM] Noemi Chapel, MD   MDM Rules/Calculators/A&P                           The patient is overall well-appearing but has ongoing symptoms, his EKG shows sinus rhythm, isolated PVC, right bundle branch block but no signs of ST elevation or depression.  His pretest probability is very high for coronary disease and given his ongoing symptoms mixed with the nausea and diaphoresis and he would likely need to be admitted to the hospital, troponin has been ordered, likely consult with cardiology.  Nitropaste ordered  Second troponin was ultimately unchanged, paged to cardiology on answered prior to change of shift, Dr. Verner Chol will follow up on recommendations, ultimately patient will likely need to be admitted due to high risk of coronary disease and recurrent obstructive pathology.  Final Clinical Impression(s) / ED Diagnoses Final diagnoses:  None    Rx / DC Orders ED Discharge Orders     None        Noemi Chapel, MD 12/17/20 [phone]

## 2020-12-16 NOTE — ED Notes (Signed)
[facility] called for transport at this time. [name]

## 2020-12-16 NOTE — Progress Notes (Signed)
ANTICOAGULATION CONSULT NOTE - Initial Consult  Pharmacy Consult for heparin Indication: chest pain/ACS  Allergies  Allergen Reactions   Codeine Nausea And Vomiting    Patient Measurements: Height: '5\' 4"'$  (162.6 cm) Weight: 73.5 kg (162 lb) IBW/kg (Calculated) : 59.2 Heparin Dosing Weight: 73 kg  Vital Signs: Temp: 97.8 F (36.6 C) (08/05 1238) Temp Source: Oral (08/05 1238) BP: 142/68 (08/05 1430) Pulse Rate: 73 (08/05 1430)  Labs: Recent Labs    12/16/20 1155 12/16/20 1340  HGB 12.4*  --   HCT 38.9*  --   PLT 133*  --   CREATININE 1.26*  --   TROPONINIHS 24* 26*    Estimated Creatinine Clearance: 34.3 mL/min (A) (by C-G formula based on SCr of 1.26 mg/dL (H)).   Medical History: Past Medical History:  Diagnosis Date   BPH (benign prostatic hypertrophy)    Chronic systolic heart failure (HCC)    Coronary artery disease    Multivessel status post CABG 8/06, LIMA-LAD, SVG-CFX, SVG-distal RCA   Essential hypertension    Hyperlipidemia    ICD (implantable cardiac defibrillator) in place    [employer]   Ischemic cardiomyopathy    Myelodysplasia, low grade ([place]) 11/27/2015   Nephrolithiasis    Prostate cancer ([place])    Vitamin B 12 deficiency 07/27/2016    Medications:  (Not in a hospital admission)   Assessment: Pharmacy consulted to dose heparin in patient with chest pain/ACS.  Patient is not on anticoagulation prior to admission.  Trop 26 CBC WNL  Goal of Therapy:  Heparin level 0.3-0.7 units/ml Monitor platelets by anticoagulation protocol: Yes   Plan:  Give 4000 units bolus x 1 Start heparin infusion at 850 units/hr Check anti-Xa level in 8 hours and daily while on heparin Continue to monitor H&H and platelets  Margot Ables, PharmD Clinical Pharmacist 12/16/2020 3:23 PM

## 2020-12-16 NOTE — ED Notes (Signed)
Pt denies chest pain at this time.

## 2020-12-16 NOTE — ED Triage Notes (Signed)
Pt arrived by [facility]. Pt c/o chest pain that started 30 min ago. Pt took 1 nitro and 1 asa '324mg'$ . Pt had cardiac hx.

## 2020-12-16 NOTE — ED Triage Notes (Signed)
Dr.Miller at bedside for [facility]. Pt now states he chest pain is located to the left side of chest.

## 2020-12-16 NOTE — Telephone Encounter (Signed)
Pt notified orders placed  

## 2020-12-17 ENCOUNTER — Inpatient Hospital Stay (HOSPITAL_COMMUNITY): Payer: Medicare Other

## 2020-12-17 DIAGNOSIS — I2510 Atherosclerotic heart disease of native coronary artery without angina pectoris: Secondary | ICD-10-CM

## 2020-12-17 DIAGNOSIS — I10 Essential (primary) hypertension: Secondary | ICD-10-CM | POA: Diagnosis not present

## 2020-12-17 DIAGNOSIS — E785 Hyperlipidemia, unspecified: Secondary | ICD-10-CM | POA: Diagnosis not present

## 2020-12-17 DIAGNOSIS — R079 Chest pain, unspecified: Secondary | ICD-10-CM

## 2020-12-17 DIAGNOSIS — I5020 Unspecified systolic (congestive) heart failure: Secondary | ICD-10-CM

## 2020-12-17 LAB — LIPID PANEL
Cholesterol: 125 mg/dL (ref 0–200)
HDL: 29 mg/dL — ABNORMAL LOW (ref >40)
LDL Cholesterol: 82 mg/dL (ref 0–99)
Total CHOL/HDL Ratio: 4.3 RATIO
Triglycerides: 71 mg/dL (ref <150)
VLDL: 14 mg/dL (ref 0–40)

## 2020-12-17 LAB — HEPARIN LEVEL (UNFRACTIONATED)
Heparin Unfractionated: 0.18 IU/mL — ABNORMAL LOW (ref 0.30–0.70)
Heparin Unfractionated: 0.63 IU/mL (ref 0.30–0.70)
Heparin Unfractionated: 0.29 IU/mL — ABNORMAL LOW (ref 0.30–0.70)

## 2020-12-17 LAB — ECHOCARDIOGRAM COMPLETE
Area-P 1/2: 4.01 cm2
Height: 63 in
S' Lateral: 4.7 cm
Weight: 2529.12 oz

## 2020-12-17 LAB — BASIC METABOLIC PANEL
Anion gap: 4 — ABNORMAL LOW (ref 5–15)
BUN: 15 mg/dL (ref 8–23)
CO2: 28 mmol/L (ref 22–32)
Calcium: 9.2 mg/dL (ref 8.9–10.3)
Chloride: 106 mmol/L (ref 98–111)
Creatinine, Ser: 1.06 mg/dL (ref 0.61–1.24)
GFR, Estimated: >60 mL/min (ref >60)
Glucose, Bld: 148 mg/dL — ABNORMAL HIGH (ref 70–99)
Potassium: 4 mmol/L (ref 3.5–5.1)
Sodium: 138 mmol/L (ref 135–145)

## 2020-12-17 LAB — CBC
HCT: 35.8 % — ABNORMAL LOW (ref 39.0–52.0)
Hemoglobin: 11.8 g/dL — ABNORMAL LOW (ref 13.0–17.0)
MCH: 30.6 pg (ref 26.0–34.0)
MCHC: 33 g/dL (ref 30.0–36.0)
MCV: 92.7 fL (ref 80.0–100.0)
Platelets: 122 10*3/uL — ABNORMAL LOW (ref 150–400)
RBC: 3.86 MIL/uL — ABNORMAL LOW (ref 4.22–5.81)
RDW: 13.1 % (ref 11.5–15.5)
WBC: 3.8 10*3/uL — ABNORMAL LOW (ref 4.0–10.5)
nRBC: 0.8 % — ABNORMAL HIGH (ref 0.0–0.2)

## 2020-12-17 LAB — TROPONIN I (HIGH SENSITIVITY): Troponin I (High Sensitivity): 34 ng/L — ABNORMAL HIGH (ref <18)

## 2020-12-17 MED ORDER — PERFLUTREN LIPID MICROSPHERE
1.0000 mL | INTRAVENOUS | Status: AC | PRN
  Administered 2020-12-17: 2 mL via INTRAVENOUS
  Filled 2020-12-17: qty 10

## 2020-12-17 MED ORDER — HEPARIN BOLUS VIA INFUSION
2000.0000 [IU] | Freq: Once | INTRAVENOUS | Status: AC
  Administered 2020-12-17: 2000 [IU] via INTRAVENOUS
  Filled 2020-12-17: qty 2000

## 2020-12-17 MED ORDER — ROSUVASTATIN CALCIUM 20 MG PO TABS
20.0000 mg | ORAL_TABLET | Freq: Every day | ORAL | Status: DC
  Administered 2020-12-17 – 2020-12-19 (×3): 20 mg via ORAL
  Filled 2020-12-17 (×3): qty 1

## 2020-12-17 NOTE — Progress Notes (Signed)
ANTICOAGULATION CONSULT NOTE - follow-up Consult  Pharmacy Consult for IV heparin Indication: chest pain/ACS  Allergies  Allergen Reactions   Codeine Nausea And Vomiting    Patient Measurements: Height: '5\' 3"'$  (160 cm) Weight: 71.7 kg (158 lb 1.1 oz) IBW/kg (Calculated) : 56.9 Heparin Dosing Weight: 73 kg  Vital Signs: Temp: 98.2 F (36.8 C) (08/06 0734) Temp Source: Oral (08/06 0734) BP: 151/84 (08/06 0734) Pulse Rate: 75 (08/06 0734)  Labs: Recent Labs    12/16/20 1155 12/16/20 1340 12/17/20 0137 12/17/20 0600 12/17/20 1125  HGB 12.4*  --  11.8*  --   --   HCT 38.9*  --  35.8*  --   --   PLT 133*  --  122*  --   --   HEPARINUNFRC  --   --  0.18*  --  0.63  CREATININE 1.26*  --  1.06  --   --   TROPONINIHS 24* 26*  --  34*  --      Estimated Creatinine Clearance: 39.5 mL/min (by C-G formula based on SCr of 1.06 mg/dL).  Assessment: Pharmacy consulted to dose heparin in patient with chest pain/ACS. Patient is not on anticoagulation prior to admission.  Current infusion: Heparin IV 1100 units/hr   Heparin level: 0.63 units/mL, therapeutic  Nurse noted no s/s of bleeding.   Goal of Therapy:  Heparin level 0.3-0.7 units/ml Monitor platelets by anticoagulation protocol: Yes   Plan:  Continue heparin IV 1100 units/hr F/u heparin level in 8 h and daily Daily monitoring for H&H, platelets, and s/s of bleeding.  Thank you for allowing pharmacy to participate in this patient's care.  Levonne Spiller, PharmD PGY1 Acute Care Resident  12/17/2020,12:33 PM

## 2020-12-17 NOTE — Progress Notes (Signed)
ANTICOAGULATION CONSULT NOTE - follow-up Consult  Pharmacy Consult for IV heparin Indication: chest pain/ACS  Allergies  Allergen Reactions   Codeine Nausea And Vomiting    Patient Measurements: Height: '5\' 3"'$  (160 cm) Weight: 71.7 kg (158 lb 1.1 oz) IBW/kg (Calculated) : 56.9 Heparin Dosing Weight: 73 kg  Vital Signs: Temp: 98.4 F (36.9 C) (08/06 1619) Temp Source: Oral (08/06 1619) BP: 153/74 (08/06 1619) Pulse Rate: 84 (08/06 1619)  Labs: Recent Labs    12/16/20 1155 12/16/20 1340 12/17/20 0137 12/17/20 0600 12/17/20 1125 12/17/20 1858  HGB 12.4*  --  11.8*  --   --   --   HCT 38.9*  --  35.8*  --   --   --   PLT 133*  --  122*  --   --   --   HEPARINUNFRC  --   --  0.18*  --  0.63 0.29*  CREATININE 1.26*  --  1.06  --   --   --   TROPONINIHS 24* 26*  --  34*  --   --      Estimated Creatinine Clearance: 39.5 mL/min (by C-G formula based on SCr of 1.06 mg/dL).  Assessment: Pharmacy consulted to dose heparin in patient with chest pain/ACS. Patient is not on anticoagulation prior to admission.  Current infusion: Heparin IV 1100 units/hr   Heparin level has trended down to 0.29.  Nurse noted no s/s of bleeding or issues with infusion.   Goal of Therapy:  Heparin level 0.3-0.7 units/ml Monitor platelets by anticoagulation protocol: Yes   Plan:  Increase heparin IV to 1200 units/hr F/u AM HL  Daily monitoring for H&H, platelets, and s/s of bleeding. Planning for [facility] on Monday   Thank you for allowing pharmacy to participate in this patient's care.  Albertina Parr, PharmD., BCPS, BCCCP Clinical Pharmacist Please refer to [facility] for unit-specific pharmacist

## 2020-12-17 NOTE — Progress Notes (Signed)
Progress Note  Patient Name: [name] Date of Encounter: 12/17/2020  [facility] Cardiologist: Rozann Lesches, MD   Subjective   CP resolved by yesterday afternoon  No SOB   Inpatient Medications    Scheduled Meds:  aspirin EC  81 mg Oral Daily   carvedilol  6.25 mg Oral BID WC   simvastatin  40 mg Oral q1800   Continuous Infusions:  heparin 1,100 Units/hr (12/17/20 0304)   PRN Meds: acetaminophen, nitroGLYCERIN, ondansetron (ZOFRAN) IV   Vital Signs    Vitals:   12/16/20 2056 12/16/20 2352 12/17/20 0400 12/17/20 0734  BP: (!) 157/87 (!) 115/48 (!) 121/56 (!) 151/84  Pulse: 85 73 63 75  Resp: '19 19 16 '$ (!) 24  Temp: 98.3 F (36.8 C) 98.5 F (36.9 C) 98.6 F (37 C) 98.2 F (36.8 C)  TempSrc: Oral Oral Oral Oral  SpO2: 95% 91% 91% 95%  Weight: 72.2 kg  71.7 kg   Height: '5\' 3"'$  (1.6 m)       Intake/Output Summary (Last 24 hours) at 12/17/2020 1126 Last data filed at 12/17/2020 0734 Gross per 24 hour  Intake --  Output 475 ml  Net -475 ml   Last 3 Weights 12/17/2020 12/16/2020 12/16/2020  Weight (lbs) 158 lb 1.1 oz 159 lb 2.8 oz 162 lb  Weight (kg) 71.7 kg 72.2 kg 73.483 kg      Telemetry    SR  - Personally Reviewed  ECG     - Personally Reviewed  Physical Exam   GEN: No acute distress.   Neck: No JVD Cardiac: RRR, no murmurs, rubs, or gallops.  Respiratory: Clear to auscultation bilaterally. GI: Soft, nontender, non-distended  MS: No edema; No deformity. Neuro:  Nonfocal  Psych: Normal affect   Labs    High Sensitivity Troponin:   Recent Labs  Lab 12/16/20 1155 12/16/20 1340 12/17/20 0600  TROPONINIHS 24* 26* 34*      Chemistry Recent Labs  Lab 12/16/20 1155 12/17/20 0137  NA 138 138  K 3.9 4.0  CL 104 106  CO2 30 28  GLUCOSE 146* 148*  BUN 17 15  CREATININE 1.26* 1.06  CALCIUM 8.9 9.2  GFRNONAA 54* >60  ANIONGAP 4* 4*     Hematology Recent Labs  Lab 12/16/20 1155 12/17/20 0137  WBC 3.8* 3.8*  RBC 4.00* 3.86*   HGB 12.4* 11.8*  HCT 38.9* 35.8*  MCV 97.3 92.7  MCH 31.0 30.6  MCHC 31.9 33.0  RDW 13.0 13.1  PLT 133* 122*    BNPNo results for input(s): BNP, PROBNP in the last 168 hours.   DDimer No results for input(s): DDIMER in the last 168 hours.   Radiology    DG Chest Portable 1 View  Result Date: 12/16/2020 CLINICAL DATA:  Chest pain EXAM: PORTABLE CHEST 1 VIEW COMPARISON:  10/31/2018 FINDINGS: Cardiac enlargement. Prior CABG. AICD in good position and unchanged. Negative for heart failure. Lungs are clear without infiltrate or effusion. Chronic right rib fractures IMPRESSION: No active disease. Electronically Signed   By: Franchot Gallo M.D.   On: 12/16/2020 12:56   ECHOCARDIOGRAM COMPLETE  Result Date: 12/17/2020    ECHOCARDIOGRAM REPORT   Patient Name:   [name] Date of Exam: 123456 Medical Rec #:  [id number]      Height:       63.0 in Accession #:    [id number]     Weight:       158.1 lb Date of Birth:  1927/02/19      BSA:          1.750 m Patient Age:    87 years       BP:           147/52 mmHg Patient Gender: M              HR:           70 bpm. Exam Location:  Inpatient Procedure: 2D Echo, Cardiac Doppler, Color Doppler and Intracardiac            Opacification Agent Indications:    R07.9* Chest pain, unspecified  History:        Patient has no prior history of Echocardiogram examinations. CHF                 and Cardiomyopathy, CAD, Defibrillator; Risk                 Factors:Hypertension and Dyslipidemia.  Sonographer:    Tiffany Dance Referring Phys: [id number] [place]  1. Poor acoustic windows.  2. Poor acoustic windows limit study Endocardium is not seen in all segments even with DEFINITY There appears to be hypokinesis of the basal inferior, basal inferoseptal, the inferolaterall and apical walls OVerall LVEF appears mildly depressed LVEF 40 to 45%, again EF difficult given poor image quality. . The left ventricular internal cavity size was severely dilated.  There is mild left ventricular hypertrophy. Left ventricular diastolic parameters are indeterminate.  3. Device lead seen in RV to distal apex. Right ventricular systolic function is normal. The right ventricular size is normal. There is moderately elevated pulmonary artery systolic pressure.  4. Left atrial size was severely dilated.  5. The mitral valve is normal in structure. Mild mitral valve regurgitation.  6. The aortic valve is tricuspid. Aortic valve regurgitation is not visualized. Mild to moderate aortic valve sclerosis/calcification is present, without any evidence of aortic stenosis.  7. The inferior vena cava is dilated in size with <50% respiratory variability, suggesting right atrial pressure of 15 mmHg. FINDINGS  Left Ventricle: Poor acoustic windows limit study Endocardium is not seen in all segments even with DEFINITY There appears to be hypokinesis of the basal inferior, basal inferoseptal, the inferolaterall and apical walls OVerall LVEF appears mildly depressed LVEF 40 to 45%, again EF difficult given poor image quality. Definity contrast agent was given IV to delineate the left ventricular endocardial borders. The left ventricular internal cavity size was severely dilated. There is mild left ventricular hypertrophy. Left ventricular diastolic parameters are indeterminate. Right Ventricle: Device leed seen in RV to distal apex. The right ventricular size is normal. Right vetricular wall thickness was not assessed. Right ventricular systolic function is normal. There is moderately elevated pulmonary artery systolic pressure. The tricuspid regurgitant velocity is 3.24 m/s, and with an assumed right atrial pressure of 8 mmHg, the estimated right ventricular systolic pressure is [id number] mmHg. Left Atrium: Left atrial size was severely dilated. Right Atrium: Right atrial size was normal in size. Pericardium: Trivial pericardial effusion is present. Mitral Valve: The mitral valve is normal in structure.  Mild mitral valve regurgitation. Tricuspid Valve: The tricuspid valve is not well visualized. Tricuspid valve regurgitation is mild. Aortic Valve: The aortic valve is tricuspid. Aortic valve regurgitation is not visualized. Mild to moderate aortic valve sclerosis/calcification is present, without any evidence of aortic stenosis. Pulmonic Valve: The pulmonic valve was not well visualized. Pulmonic valve regurgitation is trivial. Aorta: The aortic root is  normal in size and structure and the aortic root and ascending aorta are structurally normal, with no evidence of dilitation. Venous: The inferior vena cava is dilated in size with less than 50% respiratory variability, suggesting right atrial pressure of 15 mmHg. IAS/Shunts: No atrial level shunt detected by color flow Doppler. Additional Comments: A device lead is visualized.  LEFT VENTRICLE PLAX 2D LVIDd:         5.80 cm LVIDs:         4.70 cm LV PW:         1.50 cm LV IVS:        1.40 cm LVOT diam:     2.10 cm LV SV:         47 LV SV Index:   27 LVOT Area:     3.46 cm  RIGHT VENTRICLE          IVC RV Basal diam:  2.40 cm  IVC diam: 2.70 cm TAPSE (M-mode): 1.6 cm LEFT ATRIUM              Index       RIGHT ATRIUM           Index LA diam:        5.50 cm  3.14 cm/m  RA Area:     16.60 cm LA Vol (A2C):   135.0 ml 77.16 ml/m RA Volume:   42.70 ml  24.41 ml/m LA Vol (A4C):   89.3 ml  51.04 ml/m LA Biplane Vol: 111.0 ml 63.44 ml/m  AORTIC VALVE LVOT Vmax:   62.65 cm/s LVOT Vmean:  43.100 cm/s LVOT VTI:    0.136 m  AORTA Ao Root diam: 3.30 cm Ao Asc diam:  3.40 cm MITRAL VALVE               TRICUSPID VALVE MV Area (PHT): 4.01 cm    TR Peak grad:   42.0 mmHg MV Decel Time: 189 msec    TR Vmax:        324.00 cm/s MV E velocity: 76.70 cm/s MV A velocity: 47.10 cm/s  SHUNTS MV E/A ratio:  1.63        Systemic VTI:  0.14 m                            Systemic Diam: 2.10 cm Dorris Carnes MD Electronically signed by Dorris Carnes MD Signature Date/Time: 12/17/2020/10:05:04 AM     Final     Cardiac Studies  Echo    12/17/20    1. Poor acoustic windows.   2. Poor acoustic windows limit study Endocardium is not seen in all  segments even with DEFINITY There appears to be hypokinesis of the basal  inferior, basal inferoseptal, the inferolaterall and apical walls OVerall  LVEF appears mildly depressed LVEF 40  to 45%, again EF difficult given poor image quality. . The left  ventricular internal cavity size was severely dilated. There is mild left  ventricular hypertrophy. Left ventricular diastolic parameters are  indeterminate.   3. Device lead seen in RV to distal apex. Right ventricular systolic  function is normal. The right ventricular size is normal. There is  moderately elevated pulmonary artery systolic pressure.   4. Left atrial size was severely dilated.   5. The mitral valve is normal in structure. Mild mitral valve  regurgitation.   6. The aortic valve is tricuspid. Aortic valve regurgitation is not  visualized. Mild to moderate  aortic valve sclerosis/calcification is  present, without any evidence of aortic stenosis.   7. The inferior vena cava is dilated in size with <50% respiratory  variability, suggesting right atrial pressure of 15 mmHg.   Patient Profile     87 y.o. male with hx of CAD and prior CABG who presents with CP    Assessment & Plan    1  CAD  / CP   Hx of CABG in 2006 (LIMA to LAD, SVG to LCx   SVG to RCA) Minimal tropoinin elevation (24, 26, 34)  Pain free on heparin   Echo is not normal   with wall motin changes    VERY difficult study    I have reviewed with pt    Episode concerning  given hx of remote CABG     No other identifiable cause   Discussed noninvasive vs invasive Rx     Pt would like to proceed with L heart cath on Monday to redefine anatomy        Will plan for minimal contrast   No LV gram   ( Renal function is normal but was high earlier this summer when had kidney stone; improved after  treated)    2  Systolic  CHF  Echo as noted   Very difficult bedside study     S/p ICD which was not active    Volume status is OK     3  HTN  BP is a little high  Follow for now    ARB held with planned procedure    4  HL  LIpds  LDL 82  HDL 29  Trig 71    Switch to Crestor 10    5  Renal  Recent stone with transent elev in creattinine   Improved now   [facility] closely  Limit contrast    Hydrate      For questions or updates, please contact [facility] Please consult [email or web address] for contact info under        Signed, Dorris Carnes, MD  12/17/2020, 11:26 AM

## 2020-12-17 NOTE — Progress Notes (Signed)
Pt up in chair this pm for short time & tolerated well. Denied any pain today. Heparin gtt cont @ 11u/hr at this time.

## 2020-12-17 NOTE — Progress Notes (Signed)
On call Cardiology was paged. Spoke with on call PA to ask about pt's NPO status and stress test that was recommended by Dr. [name]. PA stated she would keep nurse informed as they are still discussing pt's plan of care. Pt's primary nurse made aware.

## 2020-12-17 NOTE — Progress Notes (Signed)
  Echocardiogram 2D Echocardiogram has been performed.  [name] 12/17/2020, 9:29 AM

## 2020-12-17 NOTE — Progress Notes (Signed)
ANTICOAGULATION CONSULT NOTE - Follow Up Consult  Pharmacy Consult for heparin Indication: chest pain/ACS  Labs: Recent Labs    12/16/20 1155 12/16/20 1340 12/17/20 0137  HGB 12.4*  --  11.8*  HCT 38.9*  --  35.8*  PLT 133*  --  122*  HEPARINUNFRC  --   --  0.18*  CREATININE 1.26*  --  1.06  TROPONINIHS 24* 26*  --     Assessment: 87yo male subtherapeutic on heparin with initial dosing for CP; no infusion issues or signs of bleeding per RN.  Goal of Therapy:  Heparin level 0.3-0.7 units/ml   Plan:  Will rebolus with heparin 2000 units and increase heparin infusion by 3 units/kg/hr to 1100 units/hr and check level in 8 hours.    Wynona Neat, PharmD, BCPS  12/17/2020,3:02 AM

## 2020-12-18 DIAGNOSIS — I10 Essential (primary) hypertension: Secondary | ICD-10-CM | POA: Diagnosis not present

## 2020-12-18 DIAGNOSIS — I2510 Atherosclerotic heart disease of native coronary artery without angina pectoris: Secondary | ICD-10-CM | POA: Diagnosis not present

## 2020-12-18 DIAGNOSIS — E785 Hyperlipidemia, unspecified: Secondary | ICD-10-CM | POA: Diagnosis not present

## 2020-12-18 DIAGNOSIS — I5020 Unspecified systolic (congestive) heart failure: Secondary | ICD-10-CM | POA: Diagnosis not present

## 2020-12-18 LAB — CBC
HCT: 39.3 % (ref 39.0–52.0)
Hemoglobin: 12.7 g/dL — ABNORMAL LOW (ref 13.0–17.0)
MCH: 30.4 pg (ref 26.0–34.0)
MCHC: 32.3 g/dL (ref 30.0–36.0)
MCV: 94 fL (ref 80.0–100.0)
Platelets: 113 10*3/uL — ABNORMAL LOW (ref 150–400)
RBC: 4.18 MIL/uL — ABNORMAL LOW (ref 4.22–5.81)
RDW: 13 % (ref 11.5–15.5)
WBC: 3.1 10*3/uL — ABNORMAL LOW (ref 4.0–10.5)
nRBC: 0 % (ref 0.0–0.2)

## 2020-12-18 LAB — BASIC METABOLIC PANEL
Anion gap: 6 (ref 5–15)
BUN: 16 mg/dL (ref 8–23)
CO2: 27 mmol/L (ref 22–32)
Calcium: 9 mg/dL (ref 8.9–10.3)
Chloride: 105 mmol/L (ref 98–111)
Creatinine, Ser: 1.13 mg/dL (ref 0.61–1.24)
GFR, Estimated: >60 mL/min (ref >60)
Glucose, Bld: 262 mg/dL — ABNORMAL HIGH (ref 70–99)
Potassium: 3.6 mmol/L (ref 3.5–5.1)
Sodium: 138 mmol/L (ref 135–145)

## 2020-12-18 LAB — HEPARIN LEVEL (UNFRACTIONATED)
Heparin Unfractionated: 0.52 IU/mL (ref 0.30–0.70)
Heparin Unfractionated: 0.46 IU/mL (ref 0.30–0.70)

## 2020-12-18 MED ORDER — SODIUM CHLORIDE 0.9 % IV SOLN: INTRAVENOUS | Status: DC

## 2020-12-18 MED ORDER — SODIUM CHLORIDE 0.9% FLUSH: 3.0000 mL | INTRAVENOUS | Status: DC | PRN

## 2020-12-18 MED ORDER — SODIUM CHLORIDE 0.9 % IV SOLN: 250.0000 mL | INTRAVENOUS | Status: DC | PRN

## 2020-12-18 MED ORDER — ASPIRIN 81 MG PO CHEW
81.0000 mg | CHEWABLE_TABLET | ORAL | Status: AC
  Administered 2020-12-19: 81 mg via ORAL
  Filled 2020-12-18: qty 1

## 2020-12-18 MED ORDER — SODIUM CHLORIDE 0.9% FLUSH
3.0000 mL | Freq: Two times a day (BID) | INTRAVENOUS | Status: DC
  Administered 2020-12-19: 3 mL via INTRAVENOUS

## 2020-12-18 NOTE — Progress Notes (Signed)
Progress Note  Patient Name: [name] Date of Encounter: 12/18/2020  [facility] Cardiologist: Rozann Lesches, MD   Subjective   CP resolved by yesterday afternoon  No SOB   Inpatient Medications    Scheduled Meds:  aspirin EC  81 mg Oral Daily   carvedilol  6.25 mg Oral BID WC   rosuvastatin  20 mg Oral Daily   Continuous Infusions:  heparin 1,200 Units/hr (12/17/20 1958)   PRN Meds: acetaminophen, nitroGLYCERIN, ondansetron (ZOFRAN) IV   Vital Signs    Vitals:   12/17/20 1953 12/17/20 2332 12/18/20 0403 12/18/20 0828  BP: 140/69 (!) 149/75 (!) 160/82 (!) 120/45  Pulse: 75 74 96 83  Resp: '20 18 18 18  '$ Temp: 98.3 F (36.8 C) 98 F (36.7 C) 97.6 F (36.4 C) 97.8 F (36.6 C)  TempSrc: Oral Oral Oral Oral  SpO2: 95% 98% 91% 91%  Weight:   70.5 kg   Height:        Intake/Output Summary (Last 24 hours) at 12/18/2020 0924 Last data filed at 12/18/2020 0455 Gross per 24 hour  Intake 107.58 ml  Output 775 ml  Net -667.42 ml   Last 3 Weights 12/18/2020 12/17/2020 12/16/2020  Weight (lbs) 155 lb 6.8 oz 158 lb 1.1 oz 159 lb 2.8 oz  Weight (kg) 70.5 kg 71.7 kg 72.2 kg      Telemetry    SR  with PAC  (difficult to see P wave) - Personally Reviewed  ECG     SR   RBBB   LAFB  - Personally Reviewed  Physical Exam   GEN: No acute distress.   Neck: No JVD Cardiac: RRR, no murmurs Respiratory: Clear to auscultation bilaterally. GI: Soft, nontender, non-distended  MS: No edema; No deformity. Neuro:  Nonfocal  Psych: Normal affect   Labs    High Sensitivity Troponin:   Recent Labs  Lab 12/16/20 1155 12/16/20 1340 12/17/20 0600  TROPONINIHS 24* 26* 34*      Chemistry Recent Labs  Lab 12/16/20 1155 12/17/20 0137  NA 138 138  K 3.9 4.0  CL 104 106  CO2 30 28  GLUCOSE 146* 148*  BUN 17 15  CREATININE 1.26* 1.06  CALCIUM 8.9 9.2  GFRNONAA 54* >60  ANIONGAP 4* 4*     Hematology Recent Labs  Lab 12/16/20 1155 12/17/20 0137 12/18/20 0204   WBC 3.8* 3.8* 3.1*  RBC 4.00* 3.86* 4.18*  HGB 12.4* 11.8* 12.7*  HCT 38.9* 35.8* 39.3  MCV 97.3 92.7 94.0  MCH 31.0 30.6 30.4  MCHC 31.9 33.0 32.3  RDW 13.0 13.1 13.0  PLT 133* 122* 113*    BNPNo results for input(s): BNP, PROBNP in the last 168 hours.   DDimer No results for input(s): DDIMER in the last 168 hours.   Radiology    DG Chest Portable 1 View  Result Date: 12/16/2020 CLINICAL DATA:  Chest pain EXAM: PORTABLE CHEST 1 VIEW COMPARISON:  10/31/2018 FINDINGS: Cardiac enlargement. Prior CABG. AICD in good position and unchanged. Negative for heart failure. Lungs are clear without infiltrate or effusion. Chronic right rib fractures IMPRESSION: No active disease. Electronically Signed   By: Franchot Gallo M.D.   On: 12/16/2020 12:56   ECHOCARDIOGRAM COMPLETE  Result Date: 12/17/2020    ECHOCARDIOGRAM REPORT   Patient Name:   [name] Date of Exam: 123456 Medical Rec #:  [id number]      Height:       63.0 in Accession #:  [id number]     Weight:       158.1 lb Date of Birth:  Sep 16, 1926      BSA:          1.750 m Patient Age:    87 years       BP:           147/52 mmHg Patient Gender: M              HR:           70 bpm. Exam Location:  Inpatient Procedure: 2D Echo, Cardiac Doppler, Color Doppler and Intracardiac            Opacification Agent Indications:    R07.9* Chest pain, unspecified  History:        Patient has no prior history of Echocardiogram examinations. CHF                 and Cardiomyopathy, CAD, Defibrillator; Risk                 Factors:Hypertension and Dyslipidemia.  Sonographer:    Tiffany Dance Referring Phys: [id number] [place]  1. Poor acoustic windows.  2. Poor acoustic windows limit study Endocardium is not seen in all segments even with DEFINITY There appears to be hypokinesis of the basal inferior, basal inferoseptal, the inferolaterall and apical walls OVerall LVEF appears mildly depressed LVEF 40 to 45%, again EF difficult given poor  image quality. . The left ventricular internal cavity size was severely dilated. There is mild left ventricular hypertrophy. Left ventricular diastolic parameters are indeterminate.  3. Device lead seen in RV to distal apex. Right ventricular systolic function is normal. The right ventricular size is normal. There is moderately elevated pulmonary artery systolic pressure.  4. Left atrial size was severely dilated.  5. The mitral valve is normal in structure. Mild mitral valve regurgitation.  6. The aortic valve is tricuspid. Aortic valve regurgitation is not visualized. Mild to moderate aortic valve sclerosis/calcification is present, without any evidence of aortic stenosis.  7. The inferior vena cava is dilated in size with <50% respiratory variability, suggesting right atrial pressure of 15 mmHg. FINDINGS  Left Ventricle: Poor acoustic windows limit study Endocardium is not seen in all segments even with DEFINITY There appears to be hypokinesis of the basal inferior, basal inferoseptal, the inferolaterall and apical walls OVerall LVEF appears mildly depressed LVEF 40 to 45%, again EF difficult given poor image quality. Definity contrast agent was given IV to delineate the left ventricular endocardial borders. The left ventricular internal cavity size was severely dilated. There is mild left ventricular hypertrophy. Left ventricular diastolic parameters are indeterminate. Right Ventricle: Device leed seen in RV to distal apex. The right ventricular size is normal. Right vetricular wall thickness was not assessed. Right ventricular systolic function is normal. There is moderately elevated pulmonary artery systolic pressure. The tricuspid regurgitant velocity is 3.24 m/s, and with an assumed right atrial pressure of 8 mmHg, the estimated right ventricular systolic pressure is Q000111Q mmHg. Left Atrium: Left atrial size was severely dilated. Right Atrium: Right atrial size was normal in size. Pericardium: Trivial  pericardial effusion is present. Mitral Valve: The mitral valve is normal in structure. Mild mitral valve regurgitation. Tricuspid Valve: The tricuspid valve is not well visualized. Tricuspid valve regurgitation is mild. Aortic Valve: The aortic valve is tricuspid. Aortic valve regurgitation is not visualized. Mild to moderate aortic valve sclerosis/calcification is present, without any evidence of aortic stenosis. Pulmonic  Valve: The pulmonic valve was not well visualized. Pulmonic valve regurgitation is trivial. Aorta: The aortic root is normal in size and structure and the aortic root and ascending aorta are structurally normal, with no evidence of dilitation. Venous: The inferior vena cava is dilated in size with less than 50% respiratory variability, suggesting right atrial pressure of 15 mmHg. IAS/Shunts: No atrial level shunt detected by color flow Doppler. Additional Comments: A device lead is visualized.  LEFT VENTRICLE PLAX 2D LVIDd:         5.80 cm LVIDs:         4.70 cm LV PW:         1.50 cm LV IVS:        1.40 cm LVOT diam:     2.10 cm LV SV:         47 LV SV Index:   27 LVOT Area:     3.46 cm  RIGHT VENTRICLE          IVC RV Basal diam:  2.40 cm  IVC diam: 2.70 cm TAPSE (M-mode): 1.6 cm LEFT ATRIUM              Index       RIGHT ATRIUM           Index LA diam:        5.50 cm  3.14 cm/m  RA Area:     16.60 cm LA Vol (A2C):   135.0 ml 77.16 ml/m RA Volume:   42.70 ml  24.41 ml/m LA Vol (A4C):   89.3 ml  51.04 ml/m LA Biplane Vol: 111.0 ml 63.44 ml/m  AORTIC VALVE LVOT Vmax:   62.65 cm/s LVOT Vmean:  43.100 cm/s LVOT VTI:    0.136 m  AORTA Ao Root diam: 3.30 cm Ao Asc diam:  3.40 cm MITRAL VALVE               TRICUSPID VALVE MV Area (PHT): 4.01 cm    TR Peak grad:   42.0 mmHg MV Decel Time: 189 msec    TR Vmax:        324.00 cm/s MV E velocity: 76.70 cm/s MV A velocity: 47.10 cm/s  SHUNTS MV E/A ratio:  1.63        Systemic VTI:  0.14 m                            Systemic Diam: 2.10 cm Dorris Carnes  MD Electronically signed by Dorris Carnes MD Signature Date/Time: 12/17/2020/10:05:04 AM    Final     Cardiac Studies  Echo    12/17/20    1. Poor acoustic windows.   2. Poor acoustic windows limit study Endocardium is not seen in all  segments even with DEFINITY There appears to be hypokinesis of the basal  inferior, basal inferoseptal, the inferolaterall and apical walls OVerall  LVEF appears mildly depressed LVEF 40  to 45%, again EF difficult given poor image quality. . The left  ventricular internal cavity size was severely dilated. There is mild left  ventricular hypertrophy. Left ventricular diastolic parameters are  indeterminate.   3. Device lead seen in RV to distal apex. Right ventricular systolic  function is normal. The right ventricular size is normal. There is  moderately elevated pulmonary artery systolic pressure.   4. Left atrial size was severely dilated.   5. The mitral valve is normal in structure. Mild mitral valve  regurgitation.  6. The aortic valve is tricuspid. Aortic valve regurgitation is not  visualized. Mild to moderate aortic valve sclerosis/calcification is  present, without any evidence of aortic stenosis.   7. The inferior vena cava is dilated in size with <50% respiratory  variability, suggesting right atrial pressure of 15 mmHg.   Patient Profile     87 y.o. male with hx of CAD and prior CABG who presents with CP    Assessment & Plan    1  CAD  / CP   Hx of CABG in 2006 (LIMA to LAD, SVG to LCx   SVG to RCA) Minimal tropoinin elevation (24, 26, 34)  Echo is not normal   with wall motion changes    VERY difficult study    Patient currently pain free on heparin  I have reviewed with pt  Pt is very functional at 55  (Went out on am of admit to have coffee at [employer] with friends)  Episode concerning  given hx of remote CABG     No other identifiable cause    Again, reviewed evaluation with cath  Discussed risks/benefits   Plan for in am      Will plan for minimal contrast   No LV gram   (Renal function is normal but was high earlier this summer when had kidney stone; improved after  treated)    2  Systolic CHF  Echo as noted   Very difficult bedside study     S/p ICD which was not active    Volume status is OK     3  HTN  BP is labile   Follow for now    ARB held with planned procedure    4  HL  LIpds  LDL 82  HDL 29  Trig 71    Switch to Crestor 20    5  Renal  Recent stone with transent elev in creattinine   Improved now Repeat today    Folllwo closely  Limit contrast    Hydrate       For questions or updates, please contact [facility] Please consult [email or web address] for contact info under        Signed, Dorris Carnes, MD  12/18/2020, 9:24 AM

## 2020-12-18 NOTE — Progress Notes (Signed)
[facility] for IV heparin Indication: chest pain/ACS  Allergies  Allergen Reactions   Codeine Nausea And Vomiting   Assessment: Pharmacy consulted to dose heparin in patient with chest pain/ACS. Patient is not on anticoagulation prior to admission. Heparin level 0.52 units/ml  Goal of Therapy:  Heparin level 0.3-0.7 units/ml Monitor platelets by anticoagulation protocol: Yes   Plan:  Continue heparin IV at 1200 units/hr Daily monitoring for H&H, platelets, and s/s of bleeding. Planning for [facility] on Monday   Thank you for allowing pharmacy to participate in this patient's care.  Excell Seltzer, PharmD

## 2020-12-18 NOTE — Progress Notes (Signed)
ANTICOAGULATION CONSULT NOTE - follow-up Consult  Pharmacy Consult for IV heparin Indication: chest pain/ACS  Patient Measurements: Height: '5\' 3"'$  (160 cm) Weight: 70.5 kg (155 lb 6.8 oz) IBW/kg (Calculated) : 56.9 Heparin Dosing Weight: 73 kg  Labs: Recent Labs    12/16/20 1155 12/16/20 1340 12/17/20 0137 12/17/20 0600 12/17/20 1125 12/17/20 1858 12/18/20 0204 12/18/20 0951  HGB 12.4*  --  11.8*  --   --   --  12.7*  --   HCT 38.9*  --  35.8*  --   --   --  39.3  --   PLT 133*  --  122*  --   --   --  113*  --   HEPARINUNFRC  --   --  0.18*  --    < > 0.29* 0.52 0.46  CREATININE 1.26*  --  1.06  --   --   --   --  1.13  TROPONINIHS 24* 26*  --  34*  --   --   --   --    < > = values in this interval not displayed.    Estimated Creatinine Clearance: 36.8 mL/min (by C-G formula based on SCr of 1.13 mg/dL).  Assessment: Pharmacy consulted to dose heparin in patient with chest pain/ACS. Patient is not on anticoagulation prior to admission.  Current infusion: Heparin IV 1200 units/hr   Heparin level: 0.46 units/mL, therapeutic Therapeutic level x 2  Goal of Therapy:  Heparin level 0.3-0.7 units/ml Monitor platelets by anticoagulation protocol: Yes   Plan:  Continue heparin IV 1200 units/hr F/u heparin level and CBC daily F/u plans with [facility] on Monday   Thank you for allowing pharmacy to participate in this patient's care.  Levonne Spiller, PharmD PGY1 Acute Care Resident  12/18/2020,11:05 AM

## 2020-12-19 ENCOUNTER — Encounter (HOSPITAL_COMMUNITY)
Admission: EM | Disposition: A | Discharge status: Home / Self Care | Payer: Self-pay | Source: Home / Self Care | Attending: Cardiology

## 2020-12-19 DIAGNOSIS — I25110 Atherosclerotic heart disease of native coronary artery with unstable angina pectoris: Secondary | ICD-10-CM

## 2020-12-19 DIAGNOSIS — I200 Unstable angina: Secondary | ICD-10-CM | POA: Insufficient documentation

## 2020-12-19 DIAGNOSIS — I5022 Chronic systolic (congestive) heart failure: Secondary | ICD-10-CM | POA: Diagnosis not present

## 2020-12-19 DIAGNOSIS — Z8249 Family history of ischemic heart disease and other diseases of the circulatory system: Secondary | ICD-10-CM

## 2020-12-19 HISTORY — PX: LEFT HEART CATH AND CORS/GRAFTS ANGIOGRAPHY: CATH118250

## 2020-12-19 LAB — CBC
HCT: 37.3 % — ABNORMAL LOW (ref 39.0–52.0)
Hemoglobin: 12.5 g/dL — ABNORMAL LOW (ref 13.0–17.0)
MCH: 31 pg (ref 26.0–34.0)
MCHC: 33.5 g/dL (ref 30.0–36.0)
MCV: 92.6 fL (ref 80.0–100.0)
Platelets: 110 10*3/uL — ABNORMAL LOW (ref 150–400)
RBC: 4.03 MIL/uL — ABNORMAL LOW (ref 4.22–5.81)
RDW: 13 % (ref 11.5–15.5)
WBC: 3.9 10*3/uL — ABNORMAL LOW (ref 4.0–10.5)
nRBC: 0 % (ref 0.0–0.2)

## 2020-12-19 LAB — HEPARIN LEVEL (UNFRACTIONATED): Heparin Unfractionated: 0.64 IU/mL (ref 0.30–0.70)

## 2020-12-19 SURGERY — LEFT HEART CATH AND CORS/GRAFTS ANGIOGRAPHY
Anesthesia: LOCAL

## 2020-12-19 MED ORDER — LABETALOL HCL 5 MG/ML IV SOLN: 10.0000 mg | INTRAVENOUS | Status: AC | PRN

## 2020-12-19 MED ORDER — LOSARTAN POTASSIUM 50 MG PO TABS
50.0000 mg | ORAL_TABLET | Freq: Every day | ORAL | Status: DC
  Administered 2020-12-20: 50 mg via ORAL
  Filled 2020-12-19: qty 1

## 2020-12-19 MED ORDER — LIDOCAINE HCL (PF) 1 % IJ SOLN
INTRAMUSCULAR | Status: AC
  Filled 2020-12-19: qty 30

## 2020-12-19 MED ORDER — VERAPAMIL HCL 2.5 MG/ML IV SOLN
INTRAVENOUS | Status: AC
  Filled 2020-12-19: qty 2

## 2020-12-19 MED ORDER — MIDAZOLAM HCL 2 MG/2ML IJ SOLN
INTRAMUSCULAR | Status: AC
  Filled 2020-12-19: qty 2

## 2020-12-19 MED ORDER — SODIUM CHLORIDE 0.9 % IV SOLN: INTRAVENOUS | Status: AC

## 2020-12-19 MED ORDER — IOHEXOL 350 MG/ML SOLN
INTRAVENOUS | Status: DC | PRN
  Administered 2020-12-19: 125 mL via INTRA_ARTERIAL

## 2020-12-19 MED ORDER — VERAPAMIL HCL 2.5 MG/ML IV SOLN
INTRAVENOUS | Status: DC | PRN
  Administered 2020-12-19: 10 mL via INTRA_ARTERIAL

## 2020-12-19 MED ORDER — SODIUM CHLORIDE 0.9% FLUSH: 3.0000 mL | INTRAVENOUS | Status: DC | PRN

## 2020-12-19 MED ORDER — SODIUM CHLORIDE 0.9% FLUSH
3.0000 mL | Freq: Two times a day (BID) | INTRAVENOUS | Status: DC
  Administered 2020-12-20: 3 mL via INTRAVENOUS

## 2020-12-19 MED ORDER — LIDOCAINE HCL (PF) 1 % IJ SOLN
INTRAMUSCULAR | Status: DC | PRN
  Administered 2020-12-19: 2 mL via INTRADERMAL

## 2020-12-19 MED ORDER — HEPARIN (PORCINE) IN NACL 1000-0.9 UT/500ML-% IV SOLN
INTRAVENOUS | Status: DC | PRN
  Administered 2020-12-19 (×3): 500 mL

## 2020-12-19 MED ORDER — ASPIRIN 81 MG PO CHEW
CHEWABLE_TABLET | ORAL | Status: DC | PRN
  Administered 2020-12-19: 81 mg via ORAL

## 2020-12-19 MED ORDER — HEPARIN SODIUM (PORCINE) 1000 UNIT/ML IJ SOLN
INTRAMUSCULAR | Status: DC | PRN
  Administered 2020-12-19: 3500 [IU] via INTRAVENOUS

## 2020-12-19 MED ORDER — HYDRALAZINE HCL 20 MG/ML IJ SOLN: 10.0000 mg | INTRAMUSCULAR | Status: AC | PRN

## 2020-12-19 MED ORDER — FENTANYL CITRATE (PF) 100 MCG/2ML IJ SOLN
INTRAMUSCULAR | Status: DC | PRN
  Administered 2020-12-19: 12.5 ug via INTRAVENOUS

## 2020-12-19 MED ORDER — ASPIRIN 81 MG PO CHEW
CHEWABLE_TABLET | ORAL | Status: AC
  Filled 2020-12-19: qty 1

## 2020-12-19 MED ORDER — HEPARIN SODIUM (PORCINE) 1000 UNIT/ML IJ SOLN
INTRAMUSCULAR | Status: AC
  Filled 2020-12-19: qty 1

## 2020-12-19 MED ORDER — HEPARIN (PORCINE) IN NACL 1000-0.9 UT/500ML-% IV SOLN
INTRAVENOUS | Status: AC
  Filled 2020-12-19: qty 1000

## 2020-12-19 MED ORDER — FENTANYL CITRATE (PF) 100 MCG/2ML IJ SOLN
INTRAMUSCULAR | Status: AC
  Filled 2020-12-19: qty 2

## 2020-12-19 MED ORDER — MIDAZOLAM HCL 2 MG/2ML IJ SOLN
INTRAMUSCULAR | Status: DC | PRN
  Administered 2020-12-19: 0.5 mg via INTRAVENOUS

## 2020-12-19 SURGICAL SUPPLY — 10 items
CATH INFINITI 5FR AL1 (CATHETERS) ×1 IMPLANT
CATH INFINITI 5FR MULTPACK ANG (CATHETERS) ×1 IMPLANT
DEVICE RAD COMP TR BAND LRG (VASCULAR PRODUCTS) ×1 IMPLANT
GLIDESHEATH SLEND SS 6F .021 (SHEATH) ×1 IMPLANT
GUIDEWIRE INQWIRE 1.5J.035X260 (WIRE) IMPLANT
INQWIRE 1.5J .035X260CM (WIRE) ×2
KIT HEART LEFT (KITS) ×2 IMPLANT
PACK CARDIAC CATHETERIZATION (CUSTOM PROCEDURE TRAY) ×2 IMPLANT
TRANSDUCER W/STOPCOCK (MISCELLANEOUS) ×2 IMPLANT
TUBING CIL FLEX 10 FLL-RA (TUBING) ×2 IMPLANT

## 2020-12-19 NOTE — Interval H&P Note (Signed)
History and Physical Interval Note:  12/19/2020 [id number] PM  Nona Dell  has presented today for surgery, with the diagnosis of chest pain / Unstable Angina.  The various methods of treatment have been discussed with the patient and family. After consideration of risks, benefits and other options for treatment, the patient has consented to  Procedure(s): LEFT HEART CATH AND CORS/GRAFTS ANGIOGRAPHY (N/A)  PERCUTANEOUS CORONARY INTERVENTION  as a surgical intervention.  The patient's history has been reviewed, patient examined, no change in status, stable for surgery.  I have reviewed the patient's chart and labs.  Questions were answered to the patient's satisfaction.    Cath Lab Visit (complete for each Cath Lab visit)  Clinical Evaluation Leading to the Procedure:   ACS: Yes.    Non-ACS:    Anginal Classification: CCS III  Anti-ischemic medical therapy: Minimal Therapy (1 class of medications)  Non-Invasive Test Results: No non-invasive testing performed  Prior CABG: Previous CABG   Glenetta Hew

## 2020-12-19 NOTE — H&P (View-Only) (Signed)
Progress Note  Patient Name: [name] Date of Encounter: 12/19/2020  [facility] Cardiologist: Rozann Lesches, MD   Subjective   Last episode of chest pain was last Friday, no chest pain since. No SOB.   Inpatient Medications    Scheduled Meds:  aspirin EC  81 mg Oral Daily   carvedilol  6.25 mg Oral BID WC   rosuvastatin  20 mg Oral Daily   sodium chloride flush  3 mL Intravenous Q12H   Continuous Infusions:  sodium chloride     sodium chloride 50 mL/hr at 12/19/20 0700   heparin 1,200 Units/hr (12/18/20 1400)   PRN Meds: sodium chloride, acetaminophen, nitroGLYCERIN, ondansetron (ZOFRAN) IV, sodium chloride flush   Vital Signs    Vitals:   12/18/20 2013 12/18/20 2351 12/19/20 0316 12/19/20 0806  BP: 139/77 (!) 155/66 (!) 163/75 (!) 141/81  Pulse: 72 76 72 66  Resp: '18 16 18 17  '$ Temp: (!) 97.4 F (36.3 C) 98.4 F (36.9 C) 97.8 F (36.6 C) 98 F (36.7 C)  TempSrc: Oral Oral Oral Oral  SpO2: 97% 100% 98% 96%  Weight:   70.7 kg   Height:        Intake/Output Summary (Last 24 hours) at 12/19/2020 0920 Last data filed at 12/19/2020 0316 Gross per 24 hour  Intake 179.21 ml  Output 1175 ml  Net -995.79 ml   Last 3 Weights 12/19/2020 12/18/2020 12/17/2020  Weight (lbs) 155 lb 13.8 oz 155 lb 6.8 oz 158 lb 1.1 oz  Weight (kg) 70.7 kg 70.5 kg 71.7 kg      Telemetry    NSR without significant ventricular ectopy, occasional PVCs - Personally Reviewed  ECG    NSR with RBBB - Personally Reviewed  Physical Exam   GEN: No acute distress.   Neck: No JVD Cardiac: RRR, no murmurs, rubs, or gallops.  Respiratory: Clear to auscultation bilaterally. GI: Soft, nontender, non-distended  MS: No edema; No deformity. Neuro:  Nonfocal  Psych: Normal affect   Labs    High Sensitivity Troponin:   Recent Labs  Lab 12/16/20 1155 12/16/20 1340 12/17/20 0600  TROPONINIHS 24* 26* 34*      Chemistry Recent Labs  Lab 12/16/20 1155 12/17/20 0137 12/18/20 0951   NA 138 138 138  K 3.9 4.0 3.6  CL 104 106 105  CO2 '30 28 27  '$ GLUCOSE 146* 148* 262*  BUN '17 15 16  '$ CREATININE 1.26* 1.06 1.13  CALCIUM 8.9 9.2 9.0  GFRNONAA 54* >60 >60  ANIONGAP 4* 4* 6     Hematology Recent Labs  Lab 12/17/20 0137 12/18/20 0204 12/19/20 0118  WBC 3.8* 3.1* 3.9*  RBC 3.86* 4.18* 4.03*  HGB 11.8* 12.7* 12.5*  HCT 35.8* 39.3 37.3*  MCV 92.7 94.0 92.6  MCH 30.6 30.4 31.0  MCHC 33.0 32.3 33.5  RDW 13.1 13.0 13.0  PLT 122* 113* 110*    BNPNo results for input(s): BNP, PROBNP in the last 168 hours.   DDimer No results for input(s): DDIMER in the last 168 hours.   Radiology    ECHOCARDIOGRAM COMPLETE  Result Date: 12/17/2020    ECHOCARDIOGRAM REPORT   Patient Name:   [name] Date of Exam: [id number] Medical Rec #:  [id number]      Height:       63.0 in Accession #:    [id number]     Weight:       158.1 lb Date of Birth:  09/04/1926  BSA:          1.750 m Patient Age:    87 years       BP:           147/52 mmHg Patient Gender: M              HR:           70 bpm. Exam Location:  Inpatient Procedure: 2D Echo, Cardiac Doppler, Color Doppler and Intracardiac            Opacification Agent Indications:    R07.9* Chest pain, unspecified  History:        Patient has no prior history of Echocardiogram examinations. CHF                 and Cardiomyopathy, CAD, Defibrillator; Risk                 Factors:Hypertension and Dyslipidemia.  Sonographer:    Tiffany Dance Referring Phys: [id number] [place]  1. Poor acoustic windows.  2. Poor acoustic windows limit study Endocardium is not seen in all segments even with DEFINITY There appears to be hypokinesis of the basal inferior, basal inferoseptal, the inferolaterall and apical walls OVerall LVEF appears mildly depressed LVEF 40 to 45%, again EF difficult given poor image quality. . The left ventricular internal cavity size was severely dilated. There is mild left ventricular hypertrophy. Left  ventricular diastolic parameters are indeterminate.  3. Device lead seen in RV to distal apex. Right ventricular systolic function is normal. The right ventricular size is normal. There is moderately elevated pulmonary artery systolic pressure.  4. Left atrial size was severely dilated.  5. The mitral valve is normal in structure. Mild mitral valve regurgitation.  6. The aortic valve is tricuspid. Aortic valve regurgitation is not visualized. Mild to moderate aortic valve sclerosis/calcification is present, without any evidence of aortic stenosis.  7. The inferior vena cava is dilated in size with <50% respiratory variability, suggesting right atrial pressure of 15 mmHg. FINDINGS  Left Ventricle: Poor acoustic windows limit study Endocardium is not seen in all segments even with DEFINITY There appears to be hypokinesis of the basal inferior, basal inferoseptal, the inferolaterall and apical walls OVerall LVEF appears mildly depressed LVEF 40 to 45%, again EF difficult given poor image quality. Definity contrast agent was given IV to delineate the left ventricular endocardial borders. The left ventricular internal cavity size was severely dilated. There is mild left ventricular hypertrophy. Left ventricular diastolic parameters are indeterminate. Right Ventricle: Device leed seen in RV to distal apex. The right ventricular size is normal. Right vetricular wall thickness was not assessed. Right ventricular systolic function is normal. There is moderately elevated pulmonary artery systolic pressure. The tricuspid regurgitant velocity is 3.24 m/s, and with an assumed right atrial pressure of 8 mmHg, the estimated right ventricular systolic pressure is Q000111Q mmHg. Left Atrium: Left atrial size was severely dilated. Right Atrium: Right atrial size was normal in size. Pericardium: Trivial pericardial effusion is present. Mitral Valve: The mitral valve is normal in structure. Mild mitral valve regurgitation. Tricuspid Valve:  The tricuspid valve is not well visualized. Tricuspid valve regurgitation is mild. Aortic Valve: The aortic valve is tricuspid. Aortic valve regurgitation is not visualized. Mild to moderate aortic valve sclerosis/calcification is present, without any evidence of aortic stenosis. Pulmonic Valve: The pulmonic valve was not well visualized. Pulmonic valve regurgitation is trivial. Aorta: The aortic root is normal in size and structure and  the aortic root and ascending aorta are structurally normal, with no evidence of dilitation. Venous: The inferior vena cava is dilated in size with less than 50% respiratory variability, suggesting right atrial pressure of 15 mmHg. IAS/Shunts: No atrial level shunt detected by color flow Doppler. Additional Comments: A device lead is visualized.  LEFT VENTRICLE PLAX 2D LVIDd:         5.80 cm LVIDs:         4.70 cm LV PW:         1.50 cm LV IVS:        1.40 cm LVOT diam:     2.10 cm LV SV:         47 LV SV Index:   27 LVOT Area:     3.46 cm  RIGHT VENTRICLE          IVC RV Basal diam:  2.40 cm  IVC diam: 2.70 cm TAPSE (M-mode): 1.6 cm LEFT ATRIUM              Index       RIGHT ATRIUM           Index LA diam:        5.50 cm  3.14 cm/m  RA Area:     16.60 cm LA Vol (A2C):   135.0 ml 77.16 ml/m RA Volume:   42.70 ml  24.41 ml/m LA Vol (A4C):   89.3 ml  51.04 ml/m LA Biplane Vol: 111.0 ml 63.44 ml/m  AORTIC VALVE LVOT Vmax:   62.65 cm/s LVOT Vmean:  43.100 cm/s LVOT VTI:    0.136 m  AORTA Ao Root diam: 3.30 cm Ao Asc diam:  3.40 cm MITRAL VALVE               TRICUSPID VALVE MV Area (PHT): 4.01 cm    TR Peak grad:   42.0 mmHg MV Decel Time: 189 msec    TR Vmax:        324.00 cm/s MV E velocity: 76.70 cm/s MV A velocity: 47.10 cm/s  SHUNTS MV E/A ratio:  1.63        Systemic VTI:  0.14 m                            Systemic Diam: 2.10 cm Dorris Carnes MD Electronically signed by Dorris Carnes MD Signature Date/Time: 12/17/2020/10:05:04 AM    Final     Cardiac Studies   Echo 12/17/2020 1.  Poor acoustic windows.   2. Poor acoustic windows limit study Endocardium is not seen in all  segments even with DEFINITY There appears to be hypokinesis of the basal  inferior, basal inferoseptal, the inferolaterall and apical walls OVerall  LVEF appears mildly depressed LVEF 40  to 45%, again EF difficult given poor image quality. . The left  ventricular internal cavity size was severely dilated. There is mild left  ventricular hypertrophy. Left ventricular diastolic parameters are  indeterminate.   3. Device lead seen in RV to distal apex. Right ventricular systolic  function is normal. The right ventricular size is normal. There is  moderately elevated pulmonary artery systolic pressure.   4. Left atrial size was severely dilated.   5. The mitral valve is normal in structure. Mild mitral valve  regurgitation.   6. The aortic valve is tricuspid. Aortic valve regurgitation is not  visualized. Mild to moderate aortic valve sclerosis/calcification is  present, without any evidence of aortic  stenosis.   7. The inferior vena cava is dilated in size with <50% respiratory  variability, suggesting right atrial pressure of 15 mmHg.   Patient Profile     87 y.o. male with PMH of CAD s/p CABG presented with chest pain  Assessment & Plan    CAD s/p CABG 2006 (LIMA-LAD, SVG-LCx, SVG-RCA)  - trop 24 --> 26 --> 34  - Echo poor acoustic windows, EF 40%, hypokinesis of the basal inferior, basal inferoseptal, inferolateral and apical wall, mild MR, severe LAE.   - despite his advanced age, he is quite active and functionally independent, given abnormal myoview, planned for cath today  - Risk and benefit of procedure explained to the patient who display clear understanding and agree to proceed.  Discussed with patient possible procedural risk include bleeding, vascular injury, renal injury, arrythmia, MI, stroke and loss of limb or life.  Chronic systolic CHF s/p ICD: euvolemic on exam. Previous  echo 2016 showed EF 45-50%, repeat echo obtained during this admission showed EF 40%, with wall motion abnormality  HTN  HLD  For questions or updates, please contact [place] Please consult [email or web address] for contact info under        Signed, Almyra Deforest, [place]  12/19/2020, 9:20 AM    Personally seen and examined. Agree with APP above with the following comments: Briefly 87 yo M with CAD s/p distant CABG (LIMA-LAD, SVG L:CX, SVG RCA), HFREF s/p Medtronic ICD (Not active per report) with some LV recovery who presents with CP, reduced LVEF and new WMA.  Planned for [facility] Patient notes no issues with chest pain.  Has had short runs of NSVT without symptoms Exam notable for CTAB, strong femoral pulses Labs notable for stable platelets (110) Hgb, stable kidney function with elevated BG AM of 12/18/20. Personally reviewed relevant tests; tele shows NSVT Would recommend  - Coreg 6.25 mg PO BID; if negative for new blockages and HR improves, will trial higher dose of coreg and as BP tolerates - continue ASA and heparin - for LHC-Grafts today - continue rosuvastatin - will increase losartan to 50 mg PO Daily - re-ordering labs for AM - DVT-PPX heparin presently - NPO for LHC  Potential 8/9-8/10 DC based on results

## 2020-12-19 NOTE — Care Management Important Message (Signed)
Important Message  Patient Details  Name: [name] MRN: [id number] Date of Birth: [birth year]   Medicare Important Message Given:  Yes     Esmeralda Blanford 12/19/2020, 4:44 PM

## 2020-12-19 NOTE — Plan of Care (Signed)

## 2020-12-19 NOTE — Progress Notes (Signed)
Patient leaving the floor for procedure. Patient will not return to [place] per report. Patient's belongings gathered and transferred with patient. No complaints or concerns stated by the patient.

## 2020-12-19 NOTE — Progress Notes (Addendum)
Progress Note  Patient Name: [name] Date of Encounter: 12/19/2020  [facility] Cardiologist: Rozann Lesches, MD   Subjective   Last episode of chest pain was last Friday, no chest pain since. No SOB.   Inpatient Medications    Scheduled Meds:  aspirin EC  81 mg Oral Daily   carvedilol  6.25 mg Oral BID WC   rosuvastatin  20 mg Oral Daily   sodium chloride flush  3 mL Intravenous Q12H   Continuous Infusions:  sodium chloride     sodium chloride 50 mL/hr at 12/19/20 0700   heparin 1,200 Units/hr (12/18/20 1400)   PRN Meds: sodium chloride, acetaminophen, nitroGLYCERIN, ondansetron (ZOFRAN) IV, sodium chloride flush   Vital Signs    Vitals:   12/18/20 2013 12/18/20 2351 12/19/20 0316 12/19/20 0806  BP: 139/77 (!) 155/66 (!) 163/75 (!) 141/81  Pulse: 72 76 72 66  Resp: '18 16 18 17  '$ Temp: (!) 97.4 F (36.3 C) 98.4 F (36.9 C) 97.8 F (36.6 C) 98 F (36.7 C)  TempSrc: Oral Oral Oral Oral  SpO2: 97% 100% 98% 96%  Weight:   70.7 kg   Height:        Intake/Output Summary (Last 24 hours) at 12/19/2020 0920 Last data filed at 12/19/2020 0316 Gross per 24 hour  Intake 179.21 ml  Output 1175 ml  Net -995.79 ml   Last 3 Weights 12/19/2020 12/18/2020 12/17/2020  Weight (lbs) 155 lb 13.8 oz 155 lb 6.8 oz 158 lb 1.1 oz  Weight (kg) 70.7 kg 70.5 kg 71.7 kg      Telemetry    NSR without significant ventricular ectopy, occasional PVCs - Personally Reviewed  ECG    NSR with RBBB - Personally Reviewed  Physical Exam   GEN: No acute distress.   Neck: No JVD Cardiac: RRR, no murmurs, rubs, or gallops.  Respiratory: Clear to auscultation bilaterally. GI: Soft, nontender, non-distended  MS: No edema; No deformity. Neuro:  Nonfocal  Psych: Normal affect   Labs    High Sensitivity Troponin:   Recent Labs  Lab 12/16/20 1155 12/16/20 1340 12/17/20 0600  TROPONINIHS 24* 26* 34*      Chemistry Recent Labs  Lab 12/16/20 1155 12/17/20 0137 12/18/20 0951   NA 138 138 138  K 3.9 4.0 3.6  CL 104 106 105  CO2 '30 28 27  '$ GLUCOSE 146* 148* 262*  BUN '17 15 16  '$ CREATININE 1.26* 1.06 1.13  CALCIUM 8.9 9.2 9.0  GFRNONAA 54* >60 >60  ANIONGAP 4* 4* 6     Hematology Recent Labs  Lab 12/17/20 0137 12/18/20 0204 12/19/20 0118  WBC 3.8* 3.1* 3.9*  RBC 3.86* 4.18* 4.03*  HGB 11.8* 12.7* 12.5*  HCT 35.8* 39.3 37.3*  MCV 92.7 94.0 92.6  MCH 30.6 30.4 31.0  MCHC 33.0 32.3 33.5  RDW 13.1 13.0 13.0  PLT 122* 113* 110*    BNPNo results for input(s): BNP, PROBNP in the last 168 hours.   DDimer No results for input(s): DDIMER in the last 168 hours.   Radiology    ECHOCARDIOGRAM COMPLETE  Result Date: 12/17/2020    ECHOCARDIOGRAM REPORT   Patient Name:   [name] Date of Exam: [id number] Medical Rec #:  [id number]      Height:       63.0 in Accession #:    [id number]     Weight:       158.1 lb Date of Birth:  1926-12-11  BSA:          1.750 m Patient Age:    87 years       BP:           147/52 mmHg Patient Gender: M              HR:           70 bpm. Exam Location:  Inpatient Procedure: 2D Echo, Cardiac Doppler, Color Doppler and Intracardiac            Opacification Agent Indications:    R07.9* Chest pain, unspecified  History:        Patient has no prior history of Echocardiogram examinations. CHF                 and Cardiomyopathy, CAD, Defibrillator; Risk                 Factors:Hypertension and Dyslipidemia.  Sonographer:    Tiffany Dance Referring Phys: [id number] [place]  1. Poor acoustic windows.  2. Poor acoustic windows limit study Endocardium is not seen in all segments even with DEFINITY There appears to be hypokinesis of the basal inferior, basal inferoseptal, the inferolaterall and apical walls OVerall LVEF appears mildly depressed LVEF 40 to 45%, again EF difficult given poor image quality. . The left ventricular internal cavity size was severely dilated. There is mild left ventricular hypertrophy. Left  ventricular diastolic parameters are indeterminate.  3. Device lead seen in RV to distal apex. Right ventricular systolic function is normal. The right ventricular size is normal. There is moderately elevated pulmonary artery systolic pressure.  4. Left atrial size was severely dilated.  5. The mitral valve is normal in structure. Mild mitral valve regurgitation.  6. The aortic valve is tricuspid. Aortic valve regurgitation is not visualized. Mild to moderate aortic valve sclerosis/calcification is present, without any evidence of aortic stenosis.  7. The inferior vena cava is dilated in size with <50% respiratory variability, suggesting right atrial pressure of 15 mmHg. FINDINGS  Left Ventricle: Poor acoustic windows limit study Endocardium is not seen in all segments even with DEFINITY There appears to be hypokinesis of the basal inferior, basal inferoseptal, the inferolaterall and apical walls OVerall LVEF appears mildly depressed LVEF 40 to 45%, again EF difficult given poor image quality. Definity contrast agent was given IV to delineate the left ventricular endocardial borders. The left ventricular internal cavity size was severely dilated. There is mild left ventricular hypertrophy. Left ventricular diastolic parameters are indeterminate. Right Ventricle: Device leed seen in RV to distal apex. The right ventricular size is normal. Right vetricular wall thickness was not assessed. Right ventricular systolic function is normal. There is moderately elevated pulmonary artery systolic pressure. The tricuspid regurgitant velocity is 3.24 m/s, and with an assumed right atrial pressure of 8 mmHg, the estimated right ventricular systolic pressure is Q000111Q mmHg. Left Atrium: Left atrial size was severely dilated. Right Atrium: Right atrial size was normal in size. Pericardium: Trivial pericardial effusion is present. Mitral Valve: The mitral valve is normal in structure. Mild mitral valve regurgitation. Tricuspid Valve:  The tricuspid valve is not well visualized. Tricuspid valve regurgitation is mild. Aortic Valve: The aortic valve is tricuspid. Aortic valve regurgitation is not visualized. Mild to moderate aortic valve sclerosis/calcification is present, without any evidence of aortic stenosis. Pulmonic Valve: The pulmonic valve was not well visualized. Pulmonic valve regurgitation is trivial. Aorta: The aortic root is normal in size and structure and  the aortic root and ascending aorta are structurally normal, with no evidence of dilitation. Venous: The inferior vena cava is dilated in size with less than 50% respiratory variability, suggesting right atrial pressure of 15 mmHg. IAS/Shunts: No atrial level shunt detected by color flow Doppler. Additional Comments: A device lead is visualized.  LEFT VENTRICLE PLAX 2D LVIDd:         5.80 cm LVIDs:         4.70 cm LV PW:         1.50 cm LV IVS:        1.40 cm LVOT diam:     2.10 cm LV SV:         47 LV SV Index:   27 LVOT Area:     3.46 cm  RIGHT VENTRICLE          IVC RV Basal diam:  2.40 cm  IVC diam: 2.70 cm TAPSE (M-mode): 1.6 cm LEFT ATRIUM              Index       RIGHT ATRIUM           Index LA diam:        5.50 cm  3.14 cm/m  RA Area:     16.60 cm LA Vol (A2C):   135.0 ml 77.16 ml/m RA Volume:   42.70 ml  24.41 ml/m LA Vol (A4C):   89.3 ml  51.04 ml/m LA Biplane Vol: 111.0 ml 63.44 ml/m  AORTIC VALVE LVOT Vmax:   62.65 cm/s LVOT Vmean:  43.100 cm/s LVOT VTI:    0.136 m  AORTA Ao Root diam: 3.30 cm Ao Asc diam:  3.40 cm MITRAL VALVE               TRICUSPID VALVE MV Area (PHT): 4.01 cm    TR Peak grad:   42.0 mmHg MV Decel Time: 189 msec    TR Vmax:        324.00 cm/s MV E velocity: 76.70 cm/s MV A velocity: 47.10 cm/s  SHUNTS MV E/A ratio:  1.63        Systemic VTI:  0.14 m                            Systemic Diam: 2.10 cm Dorris Carnes MD Electronically signed by Dorris Carnes MD Signature Date/Time: 12/17/2020/10:05:04 AM    Final     Cardiac Studies   Echo 12/17/2020 1.  Poor acoustic windows.   2. Poor acoustic windows limit study Endocardium is not seen in all  segments even with DEFINITY There appears to be hypokinesis of the basal  inferior, basal inferoseptal, the inferolaterall and apical walls OVerall  LVEF appears mildly depressed LVEF 40  to 45%, again EF difficult given poor image quality. . The left  ventricular internal cavity size was severely dilated. There is mild left  ventricular hypertrophy. Left ventricular diastolic parameters are  indeterminate.   3. Device lead seen in RV to distal apex. Right ventricular systolic  function is normal. The right ventricular size is normal. There is  moderately elevated pulmonary artery systolic pressure.   4. Left atrial size was severely dilated.   5. The mitral valve is normal in structure. Mild mitral valve  regurgitation.   6. The aortic valve is tricuspid. Aortic valve regurgitation is not  visualized. Mild to moderate aortic valve sclerosis/calcification is  present, without any evidence of aortic  stenosis.   7. The inferior vena cava is dilated in size with <50% respiratory  variability, suggesting right atrial pressure of 15 mmHg.   Patient Profile     87 y.o. male with PMH of CAD s/p CABG presented with chest pain  Assessment & Plan    CAD s/p CABG 2006 (LIMA-LAD, SVG-LCx, SVG-RCA)  - trop 24 --> 26 --> 34  - Echo poor acoustic windows, EF 40%, hypokinesis of the basal inferior, basal inferoseptal, inferolateral and apical wall, mild MR, severe LAE.   - despite his advanced age, he is quite active and functionally independent, given abnormal myoview, planned for cath today  - Risk and benefit of procedure explained to the patient who display clear understanding and agree to proceed.  Discussed with patient possible procedural risk include bleeding, vascular injury, renal injury, arrythmia, MI, stroke and loss of limb or life.  Chronic systolic CHF s/p ICD: euvolemic on exam. Previous  echo 2016 showed EF 45-50%, repeat echo obtained during this admission showed EF 40%, with wall motion abnormality  HTN  HLD  For questions or updates, please contact [place] Please consult [email or web address] for contact info under        Signed, Almyra Deforest, [place]  12/19/2020, 9:20 AM    Personally seen and examined. Agree with APP above with the following comments: Briefly 87 yo M with CAD s/p distant CABG (LIMA-LAD, SVG L:CX, SVG RCA), HFREF s/p Med[employer] ICD (Not active per report) with some LV recovery who presents with CP, reduced LVEF and new WMA.  Planned for [facility] Patient notes no issues with chest pain.  Has had short runs of NSVT without symptoms Exam notable for CTAB, strong femoral pulses Labs notable for stable platelets (110) Hgb, stable kidney function with elevated BG AM of 12/18/20. Personally reviewed relevant tests; tele shows NSVT Would recommend  - Coreg 6.25 mg PO BID; if negative for new blockages and HR improves, will trial higher dose of coreg and as BP tolerates - continue ASA and heparin - for LHC-Grafts today - continue rosuvastatin - will increase losartan to 50 mg PO Daily - re-ordering labs for AM - DVT-PPX heparin presently - NPO for LHC  Potential 8/9-8/10 DC based on results

## 2020-12-19 NOTE — Progress Notes (Signed)
ANTICOAGULATION CONSULT NOTE - follow-up Consult  Pharmacy Consult for IV heparin Indication: chest pain/ACS  Patient Measurements: Height: '5\' 3"'$  (160 cm) Weight: 70.7 kg (155 lb 13.8 oz) IBW/kg (Calculated) : 56.9 Heparin Dosing Weight: 73 kg  Labs: Recent Labs    12/16/20 1155 12/16/20 1340 12/17/20 0137 12/17/20 0600 12/17/20 1125 12/18/20 0204 12/18/20 0951 12/19/20 0118  HGB 12.4*  --  11.8*  --   --  12.7*  --  12.5*  HCT 38.9*  --  35.8*  --   --  39.3  --  37.3*  PLT 133*  --  122*  --   --  113*  --  110*  HEPARINUNFRC  --   --  0.18*  --    < > 0.52 0.46 0.64  CREATININE 1.26*  --  1.06  --   --   --  1.13  --   TROPONINIHS 24* 26*  --  34*  --   --   --   --    < > = values in this interval not displayed.    Estimated Creatinine Clearance: 36.8 mL/min (by C-G formula based on SCr of 1.13 mg/dL).  Assessment: Pharmacy consulted to dose heparin in patient with chest pain/ACS. Patient is not on anticoagulation prior to admission.  Current infusion: Heparin IV 1200 units/hr   Heparin level: 0.64 units/ml, therapeutic  Goal of Therapy:  Heparin level 0.3-0.7 units/ml Monitor platelets by anticoagulation protocol: Yes   Plan:  Continue heparin IV 1200 units/hr F/u heparin level and CBC daily F/u plans with [facility] on Monday   Kassaundra Hair A. Levada Dy, PharmD, BCPS, FNKF Clinical Pharmacist [place] Please utilize Amion for appropriate phone number to reach the unit pharmacist ([place])  12/19/2020,7:37 AM

## 2020-12-20 ENCOUNTER — Encounter (HOSPITAL_COMMUNITY): Payer: Self-pay | Admitting: Cardiology

## 2020-12-20 ENCOUNTER — Other Ambulatory Visit (HOSPITAL_COMMUNITY): Payer: Self-pay

## 2020-12-20 DIAGNOSIS — I25110 Atherosclerotic heart disease of native coronary artery with unstable angina pectoris: Secondary | ICD-10-CM | POA: Diagnosis not present

## 2020-12-20 LAB — BASIC METABOLIC PANEL
Anion gap: 5 (ref 5–15)
BUN: 12 mg/dL (ref 8–23)
CO2: 28 mmol/L (ref 22–32)
Calcium: 8.7 mg/dL — ABNORMAL LOW (ref 8.9–10.3)
Chloride: 105 mmol/L (ref 98–111)
Creatinine, Ser: 1.05 mg/dL (ref 0.61–1.24)
GFR, Estimated: >60 mL/min (ref >60)
Glucose, Bld: 138 mg/dL — ABNORMAL HIGH (ref 70–99)
Potassium: 3.7 mmol/L (ref 3.5–5.1)
Sodium: 138 mmol/L (ref 135–145)

## 2020-12-20 LAB — CBC
HCT: 37.7 % — ABNORMAL LOW (ref 39.0–52.0)
Hemoglobin: 12.2 g/dL — ABNORMAL LOW (ref 13.0–17.0)
MCH: 30.3 pg (ref 26.0–34.0)
MCHC: 32.4 g/dL (ref 30.0–36.0)
MCV: 93.5 fL (ref 80.0–100.0)
Platelets: 111 10*3/uL — ABNORMAL LOW (ref 150–400)
RBC: 4.03 MIL/uL — ABNORMAL LOW (ref 4.22–5.81)
RDW: 13.2 % (ref 11.5–15.5)
WBC: 3.8 10*3/uL — ABNORMAL LOW (ref 4.0–10.5)
nRBC: 0 % (ref 0.0–0.2)

## 2020-12-20 LAB — HEPARIN LEVEL (UNFRACTIONATED): Heparin Unfractionated: <0.1 IU/mL — ABNORMAL LOW (ref 0.30–0.70)

## 2020-12-20 MED ORDER — CARVEDILOL 12.5 MG PO TABS: 12.5000 mg | ORAL_TABLET | Freq: Two times a day (BID) | ORAL | Status: DC

## 2020-12-20 MED ORDER — LOSARTAN POTASSIUM 25 MG PO TABS: 50.0000 mg | ORAL_TABLET | Freq: Every day | ORAL | 1 refills | Status: DC

## 2020-12-20 MED ORDER — CARVEDILOL 12.5 MG PO TABS: 12.5000 mg | ORAL_TABLET | Freq: Two times a day (BID) | ORAL | 2 refills | Status: DC

## 2020-12-20 NOTE — Discharge Summary (Addendum)
Discharge Summary    Patient ID: BAYLER BOYACK MRN: [id number]; DOB: 1926-07-17  Admit date: 12/16/2020 Discharge date: 12/20/2020  PCP:  [name], MD   [facility] Providers Cardiologist:  Rozann Lesches, MD   {   Discharge Diagnoses    Principal Problem:   Coronary artery disease Active Problems:   Other hyperlipidemia   Essential hypertension   Chest pain   Family history of CABG    Diagnostic Studies/Procedures    LHC on 12/19/20:    Prox RCA to Dist RCA lesion is 100% stenosed with 100% stenosed side branch in RV Branch.   Mid LM to Prox LAD lesion is 60% stenosed.   Prox Cx to Mid Cx lesion is 100% stenosed.   Prox LAD to Mid LAD lesion is 80% stenosed with 95% stenosed side branch in 2nd Diag.   Mid LAD lesion is 100% stenosed with 75% stenosed side branch in 3rd Diag.   ** LPAV lesion is 80% stenosed. -Fills via retrograde flow from SVG-OM 2.  Not approachable percutaneously.   LIMA-LAD and is normal in caliber.  The graft exhibits no disease.   SVG-2nd Mrg graft was visualized by angiography and is large.  The graft exhibits no disease.   SVG-dRCA and is normal in caliber.  The graft exhibits no disease.   ----------------------   LV end diastolic pressure is moderately elevated.   There is no aortic valve stenosis.   SUMMARY Severe native CAD: 50-60% LEFT MAIN, 100% flush ostial RCA occlusion, 100 % proximal LCx occlusion after small OM1/RI, Diffuse 80% proximal-mid followed by 100% mid LAD occlusion 3 of 3 patent grafts: LIMA-LAD, SVG-OM 2, SVG-RCA. Likely culprit lesion is 80% stenosis in the distal AVG LCx prior to bifurcation into LPL 1 and LPL 2 -> this fills via retrograde flow from the SVG-OM 2.  Not approachable from percutaneous option. Moderately elevated LAP.   RECOMMENDATIONS Optimize medical management.      Echo from 12/17/20:   1. Poor acoustic windows.   2. Poor acoustic windows limit study Endocardium is not seen in all   segments even with DEFINITY There appears to be hypokinesis of the basal  inferior, basal inferoseptal, the inferolaterall and apical walls OVerall  LVEF appears mildly depressed LVEF 40  to 45%, again EF difficult given poor image quality. . The left ventricular internal cavity size was severely dilated. There is mild left  ventricular hypertrophy. Left ventricular diastolic parameters are indeterminate.   3. Device lead seen in RV to distal apex. Right ventricular systolic  function is normal. The right ventricular size is normal. There is  moderately elevated pulmonary artery systolic pressure.   4. Left atrial size was severely dilated.   5. The mitral valve is normal in structure. Mild mitral valve  regurgitation.   6. The aortic valve is tricuspid. Aortic valve regurgitation is not  visualized. Mild to moderate aortic valve sclerosis/calcification is  present, without any evidence of aortic stenosis.   7. The inferior vena cava is dilated in size with <50% respiratory  variability, suggesting right atrial pressure of 15 mmHg.  _____________   History of Present Illness     Per admission H&P:  Mr. Romel 87 yo male with PMH of CAD with CAGB in 2006 LIMA-LAD, SVG-LCx and SVG-dRCA, HTN, HL, chronic systolic HF EF at Q000111Q by most recent echo in 2016, AICD, kidney stone with admission on 10/2020 for AKI, adrenal mass, who presented with chest pain on 12/16/2020.  He had reported suddent onset of chest tightness around 930AM that morning at rest. Started out light discomfort left chest, escalated to 8-9/10 in severity. No SOB but significant diaphoresis, wife said he was dripping wet. Took NG x 1 without much improvement, called EMS. Pain lasted about 30 minutes, had resolved.    Admission diagnostic showed:  K 3.9 CR 1.26 WBC 3.8 Hgb 12.4 Plt 133 Hstrop 24-->26 EKG SR, RBBB/LAFB, no specific ischemic changes COVID neg CXR no acute process    Hospital Course     Consultants:  N/A  Chest pain CAD with hx of CABG in 2006 (LIMA to LAD, SVG to Lcx, SVG to RCA) - [facility] on 12/16[facility]20 for further evaluation chest pain - HS trop 24 >26 >34 - Echo 12/17/20 showed poor acoustic windows, EF 40%, hypokinesis of the basal inferior, basal inferoseptal, inferolateral and apical wall, mild MR, severe LAE - LHC from 12/19/20 showed severe native CAD with 50-60% stenosis LM, 100% flush ostial RCA occlusion, 100 % proximal LCx occlusion after small OM1/RI, Diffuse 80% proximal-mid followed by 100% mid LAD occlusion; patent grafts including LIMA-LAD, SVG-OM 2, SVG-RCA; Likely culprit lesion is 80% stenosis in the distal AVG LCx prior to bifurcation into LPL 1 and LPL 2 -> this fills via retrograde flow from the SVG-OM 2.  Not approachable from percutaneous option. Moderately elevated LAP. - medical therapy: recommend increasing Coreg to 12.'5mg'$  BID, increased losartan '50mg'$  daily, continue ASA '81mg'$  daily and Crestor '20mg'$  daily  - post cath care and radial care discussed  - PT evaluation requested, tolerating activity without angina  - follow up with cardiology office is arranged on 01/03/21 with APP   Chronic systolic CHF s/p ICD - Echo as above , EF reportedly 45-50% previously  - clinically euvolemic at this time  - GDMT: continue Coreg and losartan, both dosing increased this admission, may add MRA and SGLT2i if BP and renal function tolerating, will avoid adding all medication in one encounter given his advanced age, defer further change to cardiology outpatient   HTN - BP fairly controlled around 150/90 with his age  - increase Coreg to 12.'5mg'$  BID, losartan was increased to '50mg'$  daily this admission  - advised the patient to keep BP logs at home, bring to cardiology office visit for review   HLD - LDL 82 on 12/17/20  - on crestor '20mg'$  daily, discussed with patient regarding risk versus benefit of statin dosing change, patient prefers to maintain at '20mg'$  daily  dose, may repeat lipid in 4-8 weeks outpatient      Did the patient have an acute coronary syndrome (MI, NSTEMI, STEMI, etc) this admission?:  No                               Did the patient have a percutaneous coronary intervention (stent / angioplasty)?:  No.       _____________  Discharge Vitals Blood pressure (!) 148/117, pulse 89, temperature 98.1 F (36.7 C), temperature source Oral, resp. rate 20, height '5\' 4"'$  (1.626 m), weight 71 kg, SpO2 91 %.  Filed Weights   12/19/20 0316 12/19/20 1653 12/20/20 0408  Weight: 70.7 kg 71.2 kg 71 kg   Vitals:  Vitals:   12/20/20 0856 12/20/20 0900  BP: (!) 138/115 (!) 148/117  Pulse: 77 89  Resp: 16 20  Temp:  98.1 F (36.7 C)  SpO2: 95% 91%  General Appearance: In no apparent distress, laying in bed HEENT: Normocephalic, atraumatic. EOMs intact.  Neck: Supple, trachea midline, no JVDs Cardiovascular: Regular rate and rhythm, normal S1-S2,  no murmur/rub/gallop Respiratory: Resting breathing unlabored, lungs sounds clear to auscultation bilaterally, no use of accessory muscles. On room air.  No wheezes, rales or rhonchi.   Gastrointestinal: Bowel sounds positive, abdomen soft, non-tender, non-distended.  Extremities: Able to move all extremities in bed without difficulty, no edema/cyanosis/clubbing Genitourinary: genital exam not performed Musculoskeletal: Normal muscle bulk and tone, no focal weakness Skin: Intact, warm, dry.  Neurologic: Alert, oriented to person, place and time. HOH. Fluent speech, no cognitive deficit, no gross focal neuro deficit Psychiatric: Normal affect. Mood is appropriate.  Left radial site with dressing in place, some bruise noted, no hematoma or bleeding, radial pulse +, left hand warm, no neurovascular deficit       Labs & Radiologic Studies    CBC Recent Labs    12/19/20 0118 12/20/20 0230  WBC 3.9* 3.8*  HGB 12.5* 12.2*  HCT 37.3* 37.7*  MCV 92.6 93.5  PLT 110* [id number]*   Basic Metabolic  Panel Recent Labs    12/18/20 0951 12/20/20 0230  NA 138 138  K 3.6 3.7  CL 105 105  CO2 27 28  GLUCOSE 262* 138*  BUN 16 12  CREATININE 1.13 1.05  CALCIUM 9.0 8.7*   Liver Function Tests No results for input(s): AST, ALT, ALKPHOS, BILITOT, PROT, ALBUMIN in the last 72 hours. No results for input(s): LIPASE, AMYLASE in the last 72 hours. High Sensitivity Troponin:   Recent Labs  Lab 12/16/20 1155 12/16/20 1340 12/17/20 0600  TROPONINIHS 24* 26* 34*    BNP Invalid input(s): POCBNP D-Dimer No results for input(s): DDIMER in the last 72 hours. Hemoglobin A1C No results for input(s): HGBA1C in the last 72 hours. Fasting Lipid Panel No results for input(s): CHOL, HDL, LDLCALC, TRIG, CHOLHDL, LDLDIRECT in the last 72 hours. Thyroid Function Tests No results for input(s): TSH, T4TOTAL, T3FREE, THYROIDAB in the last 72 hours.  Invalid input(s): FREET3 _____________  CARDIAC CATHETERIZATION  Result Date: 12/19/2020 Formatting of this result is different from the original.   Prox RCA to Dist RCA lesion is 100% stenosed with 100% stenosed side branch in RV Branch.   Mid LM to Prox LAD lesion is 60% stenosed.   Prox Cx to Mid Cx lesion is 100% stenosed.   Prox LAD to Mid LAD lesion is 80% stenosed with 95% stenosed side branch in 2nd Diag.   Mid LAD lesion is 100% stenosed with 75% stenosed side branch in 3rd Diag.   ** LPAV lesion is 80% stenosed. -Fills via retrograde flow from SVG-OM 2.  Not approachable percutaneously.   LIMA-LAD and is normal in caliber.  The graft exhibits no disease.   SVG-2nd Mrg graft was visualized by angiography and is large.  The graft exhibits no disease.   SVG-dRCA and is normal in caliber.  The graft exhibits no disease.   ----------------------   LV end diastolic pressure is moderately elevated.   There is no aortic valve stenosis. SUMMARY Severe native CAD: 50-60% LEFT MAIN, 100% flush ostial RCA occlusion, 100 % proximal LCx occlusion after small OM1/RI,  Diffuse 80% proximal-mid followed by 100% mid LAD occlusion 3 of 3 patent grafts: LIMA-LAD, SVG-OM 2, SVG-RCA. Likely culprit lesion is 80% stenosis in the distal AVG LCx prior to bifurcation into LPL 1 and LPL 2 -> this fills via retrograde flow from the  SVG-OM 2.  Not approachable from percutaneous option. Moderately elevated LAP. RECOMMENDATIONS Optimize medical management. Glenetta Hew, MD  DG Chest Portable 1 View  Result Date: 12/16/2020 CLINICAL DATA:  Chest pain EXAM: PORTABLE CHEST 1 VIEW COMPARISON:  10/31/2018 FINDINGS: Cardiac enlargement. Prior CABG. AICD in good position and unchanged. Negative for heart failure. Lungs are clear without infiltrate or effusion. Chronic right rib fractures IMPRESSION: No active disease. Electronically Signed   By: Franchot Gallo M.D.   On: 12/16/2020 12:56   ECHOCARDIOGRAM COMPLETE  Result Date: 12/17/2020    ECHOCARDIOGRAM REPORT   Patient Name:   DANYL BALINSKI Date of Exam: [id number] Medical Rec #:  [id number]      Height:       63.0 in Accession #:    [id number]     Weight:       158.1 lb Date of Birth:  1926-11-15      BSA:          1.750 m Patient Age:    49 years       BP:           147/52 mmHg Patient Gender: M              HR:           70 bpm. Exam Location:  Inpatient Procedure: 2D Echo, Cardiac Doppler, Color Doppler and Intracardiac            Opacification Agent Indications:    R07.9* Chest pain, unspecified  History:        Patient has no prior history of Echocardiogram examinations. CHF                 and Cardiomyopathy, CAD, Defibrillator; Risk                 Factors:Hypertension and Dyslipidemia.  Sonographer:    Tiffany Dance Referring Phys: [id number] [place]  1. Poor acoustic windows.  2. Poor acoustic windows limit study Endocardium is not seen in all segments even with DEFINITY There appears to be hypokinesis of the basal inferior, basal inferoseptal, the inferolaterall and apical walls OVerall LVEF appears mildly  depressed LVEF 40 to 45%, again EF difficult given poor image quality. . The left ventricular internal cavity size was severely dilated. There is mild left ventricular hypertrophy. Left ventricular diastolic parameters are indeterminate.  3. Device lead seen in RV to distal apex. Right ventricular systolic function is normal. The right ventricular size is normal. There is moderately elevated pulmonary artery systolic pressure.  4. Left atrial size was severely dilated.  5. The mitral valve is normal in structure. Mild mitral valve regurgitation.  6. The aortic valve is tricuspid. Aortic valve regurgitation is not visualized. Mild to moderate aortic valve sclerosis/calcification is present, without any evidence of aortic stenosis.  7. The inferior vena cava is dilated in size with <50% respiratory variability, suggesting right atrial pressure of 15 mmHg. FINDINGS  Left Ventricle: Poor acoustic windows limit study Endocardium is not seen in all segments even with DEFINITY There appears to be hypokinesis of the basal inferior, basal inferoseptal, the inferolaterall and apical walls OVerall LVEF appears mildly depressed LVEF 40 to 45%, again EF difficult given poor image quality. Definity contrast agent was given IV to delineate the left ventricular endocardial borders. The left ventricular internal cavity size was severely dilated. There is mild left ventricular hypertrophy. Left ventricular diastolic parameters are indeterminate. Right Ventricle: Device leed seen in RV  to distal apex. The right ventricular size is normal. Right vetricular wall thickness was not assessed. Right ventricular systolic function is normal. There is moderately elevated pulmonary artery systolic pressure. The tricuspid regurgitant velocity is 3.24 m/s, and with an assumed right atrial pressure of 8 mmHg, the estimated right ventricular systolic pressure is Q000111Q mmHg. Left Atrium: Left atrial size was severely dilated. Right Atrium: Right  atrial size was normal in size. Pericardium: Trivial pericardial effusion is present. Mitral Valve: The mitral valve is normal in structure. Mild mitral valve regurgitation. Tricuspid Valve: The tricuspid valve is not well visualized. Tricuspid valve regurgitation is mild. Aortic Valve: The aortic valve is tricuspid. Aortic valve regurgitation is not visualized. Mild to moderate aortic valve sclerosis/calcification is present, without any evidence of aortic stenosis. Pulmonic Valve: The pulmonic valve was not well visualized. Pulmonic valve regurgitation is trivial. Aorta: The aortic root is normal in size and structure and the aortic root and ascending aorta are structurally normal, with no evidence of dilitation. Venous: The inferior vena cava is dilated in size with less than 50% respiratory variability, suggesting right atrial pressure of 15 mmHg. IAS/Shunts: No atrial level shunt detected by color flow Doppler. Additional Comments: A device lead is visualized.  LEFT VENTRICLE PLAX 2D LVIDd:         5.80 cm LVIDs:         4.70 cm LV PW:         1.50 cm LV IVS:        1.40 cm LVOT diam:     2.10 cm LV SV:         47 LV SV Index:   27 LVOT Area:     3.46 cm  RIGHT VENTRICLE          IVC RV Basal diam:  2.40 cm  IVC diam: 2.70 cm TAPSE (M-mode): 1.6 cm LEFT ATRIUM              Index       RIGHT ATRIUM           Index LA diam:        5.50 cm  3.14 cm/m  RA Area:     16.60 cm LA Vol (A2C):   135.0 ml 77.16 ml/m RA Volume:   42.70 ml  24.41 ml/m LA Vol (A4C):   89.3 ml  51.04 ml/m LA Biplane Vol: 111.0 ml 63.44 ml/m  AORTIC VALVE LVOT Vmax:   62.65 cm/s LVOT Vmean:  43.100 cm/s LVOT VTI:    0.136 m  AORTA Ao Root diam: 3.30 cm Ao Asc diam:  3.40 cm MITRAL VALVE               TRICUSPID VALVE MV Area (PHT): 4.01 cm    TR Peak grad:   42.0 mmHg MV Decel Time: 189 msec    TR Vmax:        324.00 cm/s MV E velocity: 76.70 cm/s MV A velocity: 47.10 cm/s  SHUNTS MV E/A ratio:  1.63        Systemic VTI:  0.14 m                             Systemic Diam: 2.10 cm Dorris Carnes MD Electronically signed by Dorris Carnes MD Signature Date/Time: 12/17/2020/10:05:04 AM    Final    Disposition   Patient states he is doing well, no longer having any chest pain, agreeable with  medical management and changes, wishes medication to be sent to his local pharmacy, has enough nitro at home that is no expired. Pt is being discharged home today in good condition.  Follow-up Plans & Appointments     Follow-up Information     Erma Heritage, PA-C Follow up on 01/03/2021.   Specialties: Physician Assistant, Cardiology Why: Please arrive 15 minutes early for your 3:30pm post-hospital cardiology appointment. Contact information: [place] [id number] [phone]               Follow up on 01/03/21 arranged  Discharge Instructions     Diet - low sodium heart healthy   Complete by: As directed    Discharge instructions   Complete by: As directed    New medication change:  Increased Coreg to 12.'5mg'$  twice daily Increased Losartan to '50mg'$  daily  Keep blood pressure logs, bring to cardiology office on 01/03/21 for review    [place].  PLEASE ATTEND ALL SCHEDULED FOLLOW-UP APPOINTMENTS.   Activity: Increase activity slowly as tolerated. You may shower, but no soaking baths (or swimming) for 1 week. No driving for 24 hours. No lifting over 5 lbs for 1 week. No sexual activity for 1 week.   You May Return to Work: in 2 weeks (if applicable)  Wound Care: You may wash cath site gently with soap and water. Keep cath site clean and dry. If you notice pain, swelling, bleeding or pus at your cath site, please call [phone].   Radial Site Care  Refer to this sheet in the next few weeks. These instructions provide you with information on caring for yourself after your procedure. Your caregiver may also give you more specific  instructions. Your treatment has been planned according to current medical practices, but problems sometimes occur. Call your caregiver if you have any problems or questions after your procedure.  HOME CARE INSTRUCTIONS You may shower the day after the procedure. Remove the bandage (dressing) and gently wash the site with plain soap and water. Gently pat the site dry.  Do not apply powder or lotion to the site.  Do not submerge the affected site in water for 3 to 5 days.  Inspect the site at least twice daily.  Do not flex or bend the affected arm for 24 hours.  No lifting over 5 pounds (2.3 kg) for 5 days after your procedure.  Do not drive home if you are discharged the same day of the procedure. Have someone else drive you.  You may drive 24 hours after the procedure unless otherwise instructed by your caregiver.   What to expect: Any bruising will usually fade within 1 to 2 weeks.  Blood that collects in the tissue (hematoma) may be painful to the touch. It should usually decrease in size and tenderness within 1 to 2 weeks.   SEEK IMMEDIATE MEDICAL CARE IF: You have unusual pain at the radial site.  You have redness, warmth, swelling, or pain at the radial site.  You have drainage (other than a small amount of blood on the dressing).  You have chills.  You have a fever or persistent symptoms for more than 72 hours.  You have a fever and your symptoms suddenly get worse.  Your arm becomes pale, cool, tingly, or numb.  You have heavy bleeding from the site. Hold pressure on the site.   Increase activity slowly  Complete by: As directed        Discharge Medications   Allergies as of 12/20/2020       Reactions   Codeine Nausea And Vomiting        Medication List     STOP taking these medications    cetirizine 10 MG tablet Commonly known as: ZYRTEC   HYDROcodone-acetaminophen 5-325 MG tablet Commonly known as: NORCO/VICODIN       TAKE these medications     acetaminophen 500 MG tablet Commonly known as: TYLENOL Take 1 tablet (500 mg total) by mouth every 6 (six) hours as needed for mild pain or fever.   aspirin 81 MG chewable tablet Chew 81 mg by mouth daily.   carvedilol 12.5 MG tablet Commonly known as: COREG Take 1 tablet (12.5 mg total) by mouth 2 (two) times daily with a meal. What changed:  medication strength See the new instructions.   cyanocobalamin 1000 MCG/ML injection Commonly known as: (VITAMIN B-12) ADMINISTER 1 ML IN THE MUSCLE 1 TIME AS DIRECTED   diphenhydrAMINE 25 MG tablet Commonly known as: BENADRYL Take 25 mg by mouth at bedtime as needed for sleep.   losartan 25 MG tablet Commonly known as: COZAAR Take 2 tablets (50 mg total) by mouth daily. What changed: how much to take   nitroGLYCERIN 0.4 MG SL tablet Commonly known as: NITROSTAT Place 1 tablet (0.4 mg total) under the tongue every 5 (five) minutes as needed.   simvastatin 40 MG tablet Commonly known as: ZOCOR TAKE 1 TABLET BY MOUTH EVERY DAY IN THE EVENING   vitamin C 500 MG tablet Commonly known as: ASCORBIC ACID Take 500 mg by mouth daily.   Vitamin D3 25 MCG (1000 UT) Caps Take 1 capsule by mouth daily.           Outstanding Labs/Studies   Lipid panel in 4-8 weeks    Duration of Discharge Encounter   Greater than 30 minutes including physician time.  Signed, Margie Billet, NP 12/20/2020, 10:33 AM  Personally seen and examined. Agree with APP above with the following comments: Briefly 87 yo M with new WMA consistent with Lcx disease unamenable to PCI. Patient notes that he is now doing well and is CP free.  Notes his wife is a Marine scientist and will help him check his BP at home. Exam notable for stable L Radial site. - Agree with the above.  If BP remains stable will consider both addition of Imdur 30 mg PO Daily and Farixga 10 mg - patient was able to walk the floors and up and down the stairs without incident.  Rudean Haskell,  MD [address] [place], [place] [address] [phone]  1:00 PM

## 2020-12-20 NOTE — TOC Benefit Eligibility Note (Signed)
Patient Teacher, English as a foreign language completed.    The patient is currently admitted and upon discharge could be taking Jardiance 10 mg.  The current 30 day co-pay is, $30.00.   The patient is currently admitted and upon discharge could be taking Farxiga 10 mg.  Non Formulary.   The patient is insured through [employer] [place] [employer] [place] Medicare Part D     Lyndel Safe, [place] Patient Advocate Specialist [place] Team Direct Number: [phone]  Fax: [phone]

## 2020-12-20 NOTE — Evaluation (Signed)
Physical Therapy Evaluation Patient Details Name: [name] MRN: [id number] DOB: August 07, 1926 Today's Date: 12/20/2020   History of Present Illness  Pt is 87 yo male admitted on 12/16/20 with chest pain related to CAD with hx of CABG.  Pt s/p cardiac cath on 12/19/20 with stenosis - per cardiology note will medically manage.  PT asked to see pt 12/20/20 for imminent d/c.  Pt with hx of BPH, CHF, CAD< HTN, hypelipidemia, ICD, CABG  Clinical Impression  Pt admitted with above diagnosis.  He was able to walk and perform stairs with stable vitals and no chest pain.  Demonstrated safe gait and safety awareness.  Pt reports will use a cane for a couple days at home before returning to no AD.  No further acute PT needs.  Pt at/near baseline.     Follow Up Recommendations No PT follow up    Equipment Recommendations  None recommended by PT    Recommendations for Other Services       Precautions / Restrictions Precautions Precautions: Fall      Mobility  Bed Mobility Overal bed mobility: Needs Assistance Bed Mobility: Supine to Sit;Sit to Supine     Supine to sit: Supervision Sit to supine: Supervision        Transfers Overall transfer level: Needs assistance Equipment used: None Transfers: Sit to/from Stand Sit to Stand: Supervision            Ambulation/Gait Ambulation/Gait assistance: Min guard;Supervision Gait Distance (Feet): 300 Feet Assistive device: None       General Gait Details: Min guard progressed to supervision.  Pt initially ambulating slowly but improving - reports stiff from being in bed for a day. Did occasionally use handrail - reports will use cane at home for a couple days.  Stairs Stairs: Yes Stairs assistance: Min guard Stair Management: One rail Right;Step to pattern;Forwards Number of Stairs: 4 General stair comments: min guard for safety but performed without difficulty  Wheelchair Mobility    Modified Rankin (Stroke Patients Only)        Balance Overall balance assessment: Independent Sitting-balance support: No upper extremity supported Sitting balance-Leahy Scale: Normal     Standing balance support: No upper extremity supported Standing balance-Leahy Scale: Good                               Pertinent Vitals/Pain Pain Assessment: No/denies pain    Home Living Family/patient expects to be discharged to:: Private residence Living Arrangements: Spouse/significant other Available Help at Discharge: Available 24 hours/day;Family Type of Home: House Home Access: Stairs to enter Entrance Stairs-Rails: Psychiatric nurse of Steps: 3 Home Layout: One level Home Equipment: Grab bars - tub/shower;Cane - single point      Prior Function Level of Independence: Independent         Comments: Pt independent with ADLs, IADLs, community ambulation, and driving.  Very active 87 yo. REports 2 falls in past 2 years - 1 on stairs and has added rails and 1 in yard     Hand Dominance   Dominant Hand: Right    Extremity/Trunk Assessment   Upper Extremity Assessment Upper Extremity Assessment: Overall WFL for tasks assessed    Lower Extremity Assessment Lower Extremity Assessment: Overall WFL for tasks assessed    Cervical / Trunk Assessment Cervical / Trunk Assessment: Normal  Communication   Communication: HOH  Cognition Arousal/Alertness: Awake/alert Behavior During Therapy: WFL for tasks assessed/performed Overall  Cognitive Status: Within Functional Limits for tasks assessed                                        General Comments General comments (skin integrity, edema, etc.): Activity without chest pain.  VSS on RA (sats 97%, HR 80's-90's, bp 159/65)    Exercises     Assessment/Plan    PT Assessment Patent does not need any further PT services  PT Problem List         PT Treatment Interventions      PT Goals (Current goals can be found in the Care  Plan section)  Acute Rehab PT Goals Patient Stated Goal: return home PT Goal Formulation: All assessment and education complete, DC therapy    Frequency     Barriers to discharge        Co-evaluation               AM-PAC PT "6 Clicks" Mobility  Outcome Measure Help needed turning from your back to your side while in a flat bed without using bedrails?: None Help needed moving from lying on your back to sitting on the side of a flat bed without using bedrails?: A Little Help needed moving to and from a bed to a chair (including a wheelchair)?: A Little Help needed standing up from a chair using your arms (e.g., wheelchair or bedside chair)?: A Little Help needed to walk in hospital room?: A Little Help needed climbing 3-5 steps with a railing? : A Little 6 Click Score: 19    End of Session   Activity Tolerance: Patient tolerated treatment well Patient left: in bed;with call bell/phone within reach;with bed alarm set        Time: 1030-1050 PT Time Calculation (min) (ACUTE ONLY): 20 min   Charges:   PT Evaluation $PT Eval Low Complexity: 1 Low          [name], PT Acute Rehab Services Pager [phone] [name] [phone]   [name] 12/20/2020, 10:58 AM

## 2020-12-20 NOTE — Care Management Important Message (Signed)
Important Message  Patient Details  Name: [name] MRN: [id number] Date of Birth: 08-15-1926   Medicare Important Message Given:  Yes     Shelda Altes 12/20/2020, 9:37 AM

## 2021-01-02 DIAGNOSIS — D3500 Benign neoplasm of unspecified adrenal gland: Secondary | ICD-10-CM | POA: Diagnosis not present

## 2021-01-02 DIAGNOSIS — C61 Malignant neoplasm of prostate: Secondary | ICD-10-CM | POA: Diagnosis not present

## 2021-01-02 DIAGNOSIS — N202 Calculus of kidney with calculus of ureter: Secondary | ICD-10-CM | POA: Diagnosis not present

## 2021-01-03 ENCOUNTER — Other Ambulatory Visit: Payer: Self-pay

## 2021-01-03 ENCOUNTER — Ambulatory Visit (INDEPENDENT_AMBULATORY_CARE_PROVIDER_SITE_OTHER): Payer: Medicare Other | Admitting: Student

## 2021-01-03 ENCOUNTER — Encounter: Payer: Self-pay | Admitting: Student

## 2021-01-03 ENCOUNTER — Inpatient Hospital Stay (HOSPITAL_COMMUNITY): Payer: Medicare Other | Attending: Hematology

## 2021-01-03 VITALS — BP 130/68 | HR 76 | Ht 64.0 in | Wt 162.0 lb

## 2021-01-03 DIAGNOSIS — D46Z Other myelodysplastic syndromes: Secondary | ICD-10-CM

## 2021-01-03 DIAGNOSIS — D4620 Refractory anemia with excess of blasts, unspecified: Secondary | ICD-10-CM | POA: Insufficient documentation

## 2021-01-03 DIAGNOSIS — I2510 Atherosclerotic heart disease of native coronary artery without angina pectoris: Secondary | ICD-10-CM

## 2021-01-03 DIAGNOSIS — I200 Unstable angina: Secondary | ICD-10-CM | POA: Diagnosis not present

## 2021-01-03 DIAGNOSIS — Z79899 Other long term (current) drug therapy: Secondary | ICD-10-CM

## 2021-01-03 DIAGNOSIS — E785 Hyperlipidemia, unspecified: Secondary | ICD-10-CM | POA: Diagnosis not present

## 2021-01-03 DIAGNOSIS — I10 Essential (primary) hypertension: Secondary | ICD-10-CM

## 2021-01-03 DIAGNOSIS — I5022 Chronic systolic (congestive) heart failure: Secondary | ICD-10-CM | POA: Diagnosis not present

## 2021-01-03 DIAGNOSIS — I255 Ischemic cardiomyopathy: Secondary | ICD-10-CM | POA: Diagnosis not present

## 2021-01-03 DIAGNOSIS — N1831 Chronic kidney disease, stage 3a: Secondary | ICD-10-CM | POA: Diagnosis not present

## 2021-01-03 LAB — IRON AND TIBC
Iron: 86 ug/dL (ref 45–182)
Saturation Ratios: 24 % (ref 17.9–39.5)
TIBC: 357 ug/dL (ref 250–450)
UIBC: 271 ug/dL

## 2021-01-03 LAB — COMPREHENSIVE METABOLIC PANEL
ALT: 11 U/L (ref 0–44)
AST: 13 U/L — ABNORMAL LOW (ref 15–41)
Albumin: 3.7 g/dL (ref 3.5–5.0)
Alkaline Phosphatase: 43 U/L (ref 38–126)
Anion gap: 5 (ref 5–15)
BUN: 20 mg/dL (ref 8–23)
CO2: 28 mmol/L (ref 22–32)
Calcium: 8.7 mg/dL — ABNORMAL LOW (ref 8.9–10.3)
Chloride: 104 mmol/L (ref 98–111)
Creatinine, Ser: 1.32 mg/dL — ABNORMAL HIGH (ref 0.61–1.24)
GFR, Estimated: 51 mL/min — ABNORMAL LOW (ref >60)
Glucose, Bld: 142 mg/dL — ABNORMAL HIGH (ref 70–99)
Potassium: 4.6 mmol/L (ref 3.5–5.1)
Sodium: 137 mmol/L (ref 135–145)
Total Bilirubin: 1.1 mg/dL (ref 0.3–1.2)
Total Protein: 6.6 g/dL (ref 6.5–8.1)

## 2021-01-03 LAB — CBC WITH DIFFERENTIAL/PLATELET
Abs Immature Granulocytes: 0.01 10*3/uL (ref 0.00–0.07)
Basophils Absolute: 0 10*3/uL (ref 0.0–0.1)
Basophils Relative: 0 %
Eosinophils Absolute: 0 10*3/uL (ref 0.0–0.5)
Eosinophils Relative: 0 %
HCT: 39.8 % (ref 39.0–52.0)
Hemoglobin: 12.6 g/dL — ABNORMAL LOW (ref 13.0–17.0)
Immature Granulocytes: 0 %
Lymphocytes Relative: 23 %
Lymphs Abs: 0.9 10*3/uL (ref 0.7–4.0)
MCH: 30.5 pg (ref 26.0–34.0)
MCHC: 31.7 g/dL (ref 30.0–36.0)
MCV: 96.4 fL (ref 80.0–100.0)
Monocytes Absolute: 1.3 10*3/uL — ABNORMAL HIGH (ref 0.1–1.0)
Monocytes Relative: 33 %
Neutro Abs: 1.7 10*3/uL (ref 1.7–7.7)
Neutrophils Relative %: 44 %
Platelets: 122 10*3/uL — ABNORMAL LOW (ref 150–400)
RBC: 4.13 MIL/uL — ABNORMAL LOW (ref 4.22–5.81)
RDW: 13.3 % (ref 11.5–15.5)
WBC: 3.9 10*3/uL — ABNORMAL LOW (ref 4.0–10.5)
nRBC: 0 % (ref 0.0–0.2)

## 2021-01-03 LAB — VITAMIN B12: Vitamin B-12: 430 pg/mL (ref 180–914)

## 2021-01-03 LAB — FERRITIN: Ferritin: 21 ng/mL — ABNORMAL LOW (ref 24–336)

## 2021-01-03 MED ORDER — LOSARTAN POTASSIUM 50 MG PO TABS: 50.0000 mg | ORAL_TABLET | Freq: Every day | ORAL | 1 refills | Status: DC

## 2021-01-03 NOTE — Patient Instructions (Signed)
Medication Instructions:  Your physician recommends that you continue on your current medications as directed. Please refer to the Current Medication list given to you today.  *If you need a refill on your cardiac medications before your next appointment, please call your pharmacy*   Lab Work: BMET in 6-8 weeks  (October 4-18 th)   If you have labs (blood work) drawn today and your tests are completely normal, you will receive your results only by: [place] (if you have MyChart) OR A paper copy in the mail If you have any lab test that is abnormal or we need to change your treatment, we will call you to review the results.   Testing/Procedures: None today    Follow-Up: At [facility], you and your health needs are our priority.  As part of our continuing mission to provide you with exceptional heart care, we have created designated Provider Care Teams.  These Care Teams include your primary Cardiologist (physician) and Advanced Practice Providers (APPs -  Physician Assistants and Nurse Practitioners) who all work together to provide you with the care you need, when you need it.  We recommend signing up for the patient portal called "MyChart".  Sign up information is provided on this After Visit Summary.  MyChart is used to connect with patients for Virtual Visits (Telemedicine).  Patients are able to view lab/test results, encounter notes, upcoming appointments, etc.  Non-urgent messages can be sent to your provider as well.   To learn more about what you can do with MyChart, go to [email or web address].    Your next appointment:  November 17 th at 1 pm with B.Strader, PA-C

## 2021-01-03 NOTE — Progress Notes (Signed)
Cardiology Office Note    Date:  01/03/2021   ID:  MARCIS [name], DOB [id number], MRN [id number]  PCP:  [name], MD  Cardiologist: Rozann Lesches, MD    Chief Complaint  Patient presents with   Hospitalization Follow-up    History of Present Illness:    [name] is a 87 y.o. male with past medical history of CAD (s/p CABG in 2006 with LIMA-LAD, SVG-LCx and SVG-dRCA), HFrEF/ICM (EF at 45-50% by most recent echo in 2016), Medtronic ICD at [facility] (previously followed by Dr. Lovena Le and at [facility] in 2016 and not replaced due to improvement in his EF), history of prostate cancer, MDS, HTN and HLD who presents to the office today for hospital follow-up.  He was examined by myself in 11/2020 and had recently been hospitalized for nephrolithiasis and required cystoscopy with right ureteral stent placement. He did have an AKI during admission with Spironolactone and Losartan being held at discharge. At the time of his office visit, he reported improvement in his pelvic pain and denied any recent anginal symptoms.  Labs were requested from his PCP with plans to add back Losartan if renal function had improved.  In the interim, he presented to [facility] ED on 12/16/2020 for evaluation of chest pain with associated diaphoresis and symptoms were concerning for unstable angina. Hs Troponin values were minimally elevated at 34. He was started on IV Heparin and transferred to [facility] with plans for a cardiac catheterization. His echocardiogram showed a mildly reduced EF of 40 to 45% but overall poor image quality. He did have mild MR and mild to moderate aortic valve sclerosis without stenosis. Repeat catheterization showed 3/3 patent grafts and the likely culprit lesion was 80% stenosis in the distal LPAV prior to bifurcation into LPL 1 and LPL 2 which was filled from retrograde flow from the SVG to OM 2 and was not approachable for PCI and medical management was recommended. Coreg was increased to  12.5 mg twice daily and Losartan increased to 50 mg daily. He was continued on ASA 81 mg daily and Crestor 20 mg daily with consideration of adding Spironolactone or an SGLT2 inhibitor as an outpatient if renal function remained stable (was at 1.05 on the day of discharge).   In talking with the patient today, he reports overall doing well since returning home. He denies any recurrent chest pain. Reports his breathing has been stable with no recent orthopnea, PND or lower extremity edema. He denies any recent palpitations or dizziness. His blood pressure has overall been well controlled when checked at home and systolic readings range from the 120's to 150's. No complications regarding his radial cath site.   Past Medical History:  Diagnosis Date   BPH (benign prostatic hypertrophy)    Chronic systolic heart failure (HCC)    Coronary artery disease    a. Multivessel status post CABG 8/06, LIMA-LAD, SVG-CFX, SVG-distal RCA b. 12/2020: cath showing 3/3 patent grafts with 80% stenosis along LPAV prior to bifurication and medical management recommended.   Essential hypertension    Hyperlipidemia    ICD (implantable cardiac defibrillator) in place    Medtronic   Ischemic cardiomyopathy    Myelodysplasia, low grade ([place]) 11/27/2015   Nephrolithiasis    Prostate cancer ([place])    Vitamin B 12 deficiency 07/27/2016    Past Surgical History:  Procedure Laterality Date   CARDIAC DEFIBRILLATOR PLACEMENT     COLONOSCOPY     COLONOSCOPY N/A 08/19/2013  Procedure: COLONOSCOPY;  Surgeon: Rogene Houston, MD;  Location: [facility]ITE;  Service: Endoscopy;  Laterality: N/A;  47   CORONARY ARTERY BYPASS GRAFT     8/06 with left anterior descending artery, saphenous vein graft to circumflex, saphenous vein graft to distal right coronary artery   CYSTOSCOPY/URETEROSCOPY/HOLMIUM LASER/STENT PLACEMENT Right 11/07/2020   Procedure: CYSTOSCOPY/URETEROSCOPY/HOLMIUM LASER/STENT PLACEMENT;  Surgeon: Alexis Frock, MD;  Location: [facility] ORS;  Service: Urology;  Laterality: Right;   HERNIA REPAIR     LEFT HEART CATH AND CORS/GRAFTS ANGIOGRAPHY N/A 12/19/2020   Procedure: LEFT HEART CATH AND CORS/GRAFTS ANGIOGRAPHY;  Surgeon: Leonie Man, MD;  Location: [place] [facility] CV LAB;  Service: Cardiovascular;  Laterality: N/A;   POLYPECTOMY     PROSTATE SURGERY      Current Medications: Outpatient Medications Prior to Visit  Medication Sig Dispense Refill   acetaminophen (TYLENOL) 500 MG tablet Take 1 tablet (500 mg total) by mouth every 6 (six) hours as needed for mild pain or fever. 30 tablet 0   aspirin 81 MG chewable tablet Chew 81 mg by mouth daily.     carvedilol (COREG) 12.5 MG tablet Take 1 tablet (12.5 mg total) by mouth 2 (two) times daily with a meal. 60 tablet 2   Cholecalciferol (VITAMIN D3) 1000 UNITS CAPS Take 1 capsule by mouth daily.     cyanocobalamin (,VITAMIN B-12,) 1000 MCG/ML injection ADMINISTER 1 ML IN THE MUSCLE 1 TIME AS DIRECTED 1 mL 14   diphenhydrAMINE (BENADRYL) 25 MG tablet Take 25 mg by mouth at bedtime as needed for sleep.     nitroGLYCERIN (NITROSTAT) 0.4 MG SL tablet Place 1 tablet (0.4 mg total) under the tongue every 5 (five) minutes as needed. 25 tablet 3   simvastatin (ZOCOR) 40 MG tablet TAKE 1 TABLET BY MOUTH EVERY DAY IN THE EVENING 90 tablet 2   vitamin C (ASCORBIC ACID) 500 MG tablet Take 500 mg by mouth daily.       losartan (COZAAR) 25 MG tablet Take 2 tablets (50 mg total) by mouth daily. 90 tablet 1   No facility-administered medications prior to visit.     Allergies:   Codeine   Social History   Socioeconomic History   Marital status: Married    Spouse name: Not on file   Number of children: Not on file   Years of education: Not on file   Highest education level: Not on file  Occupational History   Occupation: Retired    Fish farm manager: RETIRED  Tobacco Use   Smoking status: Former    Packs/day: 0.25    Years: 44.00    Pack years: 11.00    Types:  Cigarettes    Quit date: 05/14/1992    Years since quitting: 28.6   Smokeless tobacco: Never  Vaping Use   Vaping Use: Never used  Substance and Sexual Activity   Alcohol use: No   Drug use: No   Sexual activity: Not on file    Comment: married 38 years  Other Topics Concern   Not on file  Social History Narrative   Not on file   Social Determinants of Health   Financial Resource Strain: Not on file  Food Insecurity: Not on file  Transportation Needs: Not on file  Physical Activity: Not on file  Stress: Not on file  Social Connections: Not on file     Family History:  The patient's family history includes Colon cancer in his father; Coronary artery disease in an  other family member.   Review of Systems:    Please see the history of present illness.     All other systems reviewed and are otherwise negative except as noted above.   Physical Exam:    VS:  BP 130/68   Pulse 76   Ht '5\' 4"'$  (1.626 m)   Wt 162 lb (73.5 kg)   SpO2 94%   BMI 27.81 kg/m    General: Pleasant elderly male appearing in no acute distress. Head: Normocephalic, atraumatic. Neck: No carotid bruits. JVD not elevated.  Lungs: Respirations regular and unlabored, without wheezes or rales.  Heart: Regular rate and rhythm. No S3 or S4.  No murmur, no rubs, or gallops appreciated. Abdomen: Appears non-distended. No obvious abdominal masses. Msk:  Strength and tone appear normal for age. No obvious joint deformities or effusions. Extremities: No clubbing or cyanosis. Trace lower extremity edema.  Distal pedal pulses are 2+ bilaterally. Radial cath site stable without ecchymosis or evidence of a hematoma.  Neuro: Alert and oriented X 3. Moves all extremities spontaneously. No focal deficits noted. Psych:  Responds to questions appropriately with a normal affect. Skin: No rashes or lesions noted  Wt Readings from Last 3 Encounters:  01/03/21 162 lb (73.5 kg)  12/20/20 156 lb 9.6 oz (71 kg)  12/02/20 162  lb (73.5 kg)     Studies/Labs Reviewed:   EKG:  EKG is not ordered today.   Recent Labs: 11/07/2020: Magnesium 1.8 01/03/2021: ALT 11; BUN 20; Creatinine, Ser 1.32; Hemoglobin 12.6; Platelets 122; Potassium 4.6; Sodium 137   Lipid Panel    Component Value Date/Time   CHOL 125 12/17/2020 0137   TRIG 71 12/17/2020 0137   HDL 29 (L) 12/17/2020 0137   CHOLHDL 4.3 12/17/2020 0137   VLDL 14 12/17/2020 0137   LDLCALC 82 12/17/2020 0137   LDLCALC 78 06/09/2019 0808    Additional studies/ records that were reviewed today include:   Echocardiogram: 12/2020 IMPRESSIONS     1. Poor acoustic windows.   2. Poor acoustic windows limit study Endocardium is not seen in all  segments even with DEFINITY There appears to be hypokinesis of the basal  inferior, basal inferoseptal, the inferolaterall and apical walls OVerall  LVEF appears mildly depressed LVEF 40  to 45%, again EF difficult given poor image quality. . The left  ventricular internal cavity size was severely dilated. There is mild left  ventricular hypertrophy. Left ventricular diastolic parameters are  indeterminate.   3. Device lead seen in RV to distal apex. Right ventricular systolic  function is normal. The right ventricular size is normal. There is  moderately elevated pulmonary artery systolic pressure.   4. Left atrial size was severely dilated.   5. The mitral valve is normal in structure. Mild mitral valve  regurgitation.   6. The aortic valve is tricuspid. Aortic valve regurgitation is not  visualized. Mild to moderate aortic valve sclerosis/calcification is  present, without any evidence of aortic stenosis.   7. The inferior vena cava is dilated in size with <50% respiratory  variability, suggesting right atrial pressure of 15 mmHg.    Cardiac Catheterization: 12/2020 Prox RCA to Dist RCA lesion is 100% stenosed with 100% stenosed side branch in RV Branch.   Mid LM to Prox LAD lesion is 60% stenosed.   Prox Cx  to Mid Cx lesion is 100% stenosed.   Prox LAD to Mid LAD lesion is 80% stenosed with 95% stenosed side branch in 2nd Diag.  Mid LAD lesion is 100% stenosed with 75% stenosed side branch in 3rd Diag.   ** LPAV lesion is 80% stenosed. -Fills via retrograde flow from SVG-OM 2.  Not approachable percutaneously.   LIMA-LAD and is normal in caliber.  The graft exhibits no disease.   SVG-2nd Mrg graft was visualized by angiography and is large.  The graft exhibits no disease.   SVG-dRCA and is normal in caliber.  The graft exhibits no disease.   ----------------------   LV end diastolic pressure is moderately elevated.   There is no aortic valve stenosis.   SUMMARY Severe native CAD: 50-60% LEFT MAIN,  100% flush ostial RCA occlusion, 100 % proximal LCx occlusion after small OM1/RI,  Diffuse 80% proximal-mid followed by 100% mid LAD occlusion 3 of 3 patent grafts: LIMA-LAD, SVG-OM 2, SVG-RCA. Likely culprit lesion is 80% stenosis in the distal AVG LCx prior to bifurcation into LPL 1 and LPL 2 -> this fills via retrograde flow from the SVG-OM 2.  Not approachable from percutaneous option. Moderately elevated LAP.   RECOMMENDATIONS Optimize medical management.    Assessment:    1. Coronary artery disease involving native coronary artery of native heart without angina pectoris   2. Chronic systolic heart failure ([place])   3. Ischemic cardiomyopathy   4. Medication management   5. Hyperlipidemia LDL goal <70   6. Essential hypertension   7. Stage 3a chronic kidney disease ([place])      Plan:   In order of problems listed above:  1. CAD - He is s/p CABG in 2006 with LIMA-LAD, SVG-LCx and SVG-dRCA and was recently hospitalized for symptoms concerning for unstable angina and repeat catheterization showed 3/3 patent grafts and the likely culprit lesion was 80% stenosis in the distal LPAV and medical management was recommended. - He denies any recurrent chest pain or dyspnea on exertion since  returning home. - Continue current medication regimen with ASA 81 mg daily, Coreg 12.5 mg twice daily and Simvastatin 40 mg daily.  2. HFrEF/ICM - His EF was at 45-50% by most recent echo in 2016, read as 40 to 45% by most recent echocardiogram and overall image quality was poor. He does have a Medtronic ICD at [facility] which was previously followed by Dr. Lovena Le and at [facility] in 2016 and not replaced due to improvement in his EF. - He has trace lower extremity edema on examination today but denies any orthopnea, PND or dyspnea on exertion. He is currently on Coreg 12.5 mg twice daily and Losartan 50 mg daily.  Given that his creatinine has increased from 1.05 at the time of hospital discharge to 1.32 today, I will hold off on restarting Spironolactone at this time. Will plan to recheck labs again in 6-8 weeks and can hopefully restart at that time or add an SGLT2 inhibitor.  3. HLD - His LDL was 82 in 12/2020 and he has been continued on Simvastatin 40 mg daily. He was switched to Crestor during his recent admission but wished to continue his prior statin at the time of discharge.  4. HTN - His blood pressure is well controlled at 130/68 during today's visit. Will continue Coreg and Losartan at current dosing for now. Will plan to recheck labs as outlined above with consideration of restarting Spironolactone in the future pending reassessment of BP and renal function.  5. Stage 3 CKD - His renal function has been significantly variable this year given that he had nephrolithiasis in 10/2020 and creatinine peaked at  3.43. This was at 1.05 upon the time of hospital discharge but has trended up to 1.32 when checked earlier today. Will plan for a repeat BMET in 6-8 weeks if not checked by his PCP in the interim.    Medication Adjustments/Labs and Tests Ordered: Current medicines are reviewed at length with the patient today.  Concerns regarding medicines are outlined above.  Medication changes, Labs and Tests  ordered today are listed in the Patient Instructions below. Patient Instructions  Medication Instructions:  Your physician recommends that you continue on your current medications as directed. Please refer to the Current Medication list given to you today.  *If you need a refill on your cardiac medications before your next appointment, please call your pharmacy*   Lab Work: BMET in 6-8 weeks  (October 4-18 th)   If you have labs (blood work) drawn today and your tests are completely normal, you will receive your results only by: [place] (if you have MyChart) OR A paper copy in the mail If you have any lab test that is abnormal or we need to change your treatment, we will call you to review the results.   Testing/Procedures: None today    Follow-Up: At [facility], you and your health needs are our priority.  As part of our continuing mission to provide you with exceptional heart care, we have created designated Provider Care Teams.  These Care Teams include your primary Cardiologist (physician) and Advanced Practice Providers (APPs -  Physician Assistants and Nurse Practitioners) who all work together to provide you with the care you need, when you need it.  We recommend signing up for the patient portal called "MyChart".  Sign up information is provided on this After Visit Summary.  MyChart is used to connect with patients for Virtual Visits (Telemedicine).  Patients are able to view lab/test results, encounter notes, upcoming appointments, etc.  Non-urgent messages can be sent to your provider as well.   To learn more about what you can do with MyChart, go to [email or web address].    Your next appointment:  November 17 th at 1 pm with B.Demone Lyles, PA-C   Signed, Erma Heritage, PA-C  01/03/2021 5:10 PM    [place] [address] [place], [place] [address] Phone: [phone] Fax: [phone]

## 2021-01-10 ENCOUNTER — Ambulatory Visit (HOSPITAL_COMMUNITY): Payer: Medicare Other | Admitting: Hematology

## 2021-01-15 NOTE — Progress Notes (Signed)
[name], [place] [id number]   CLINIC:  Medical Oncology/Hematology  PCP:  [name], MD [place] [address] [phone]   REASON FOR VISIT:  Follow-up for low-grade MDS & vitamin B12 deficiency  PRIOR THERAPY: None  CURRENT THERAPY: Monthly B12 injection  INTERVAL HISTORY:  [name] 87 y.o. male returns for routine follow-up of his low-grade MDS.  He was last seen by Dr. Delton Coombes on 06/28/2020.  At today's visit, he reports feeling well.  He was recently hospitalized from 12/16/2020 - 12/20/2020 for unstable angina and CAD, with left heart catheterization performed on 12/19/2020.  He reports that he is recovering well from his hospital stay and continues to follow with cardiology.  [name] reports that he has "pretty good" energy, although he does tire easier than he used to.  He denies any chest pain, dyspnea on exertion, syncope, palpitations, or dizziness.  He admits to easy bruising, but denies petechial rash or spontaneous bleeding.  No hematuria, hemoptysis, hematochezia, melena, or epistaxis.  No recent infections or B symptoms such as fever, chills, night sweats, unintentional weight loss.  He has 60% energy and 100% appetite. He endorses that he is maintaining a stable weight.   REVIEW OF SYSTEMS:  Review of Systems  Constitutional:  Positive for fatigue. Negative for appetite change, chills, diaphoresis, fever and unexpected weight change.  HENT:   Negative for lump/mass and nosebleeds.   Eyes:  Negative for eye problems.  Respiratory:  Positive for shortness of breath (with activity). Negative for cough and hemoptysis.   Cardiovascular:  Positive for leg swelling (feet/ankles). Negative for chest pain and palpitations.  Gastrointestinal:  Negative for abdominal pain, blood in stool, constipation, diarrhea, nausea and vomiting.  Genitourinary:  Negative for hematuria.   Skin: Negative.   Neurological:   Negative for dizziness, headaches and light-headedness.  Hematological:  Does not bruise/bleed easily.     PAST MEDICAL/SURGICAL HISTORY:  Past Medical History:  Diagnosis Date   BPH (benign prostatic hypertrophy)    Chronic systolic heart failure (HCC)    Coronary artery disease    a. Multivessel status post CABG 8/06, LIMA-LAD, SVG-CFX, SVG-distal RCA b. 12/2020: cath showing 3/3 patent grafts with 80% stenosis along LPAV prior to bifurication and medical management recommended.   Essential hypertension    Hyperlipidemia    ICD (implantable cardiac defibrillator) in place    [employer]   Ischemic cardiomyopathy    Myelodysplasia, low grade ([facility]) 11/27/2015   Nephrolithiasis    Prostate cancer ([facility])    Vitamin B 12 deficiency 07/27/2016   Past Surgical History:  Procedure Laterality Date   CARDIAC DEFIBRILLATOR PLACEMENT     COLONOSCOPY     COLONOSCOPY N/A 08/19/2013   Procedure: COLONOSCOPY;  Surgeon: Rogene Houston, MD;  Location: [facility];  Service: Endoscopy;  Laterality: N/A;  43   CORONARY ARTERY BYPASS GRAFT     8/06 with left anterior descending artery, saphenous vein graft to circumflex, saphenous vein graft to distal right coronary artery   CYSTOSCOPY/URETEROSCOPY/HOLMIUM LASER/STENT PLACEMENT Right 11/07/2020   Procedure: CYSTOSCOPY/URETEROSCOPY/HOLMIUM LASER/STENT PLACEMENT;  Surgeon: Alexis Frock, MD;  Location: [facility];  Service: Urology;  Laterality: Right;   HERNIA REPAIR     LEFT HEART CATH AND CORS/GRAFTS ANGIOGRAPHY N/A 12/19/2020   Procedure: LEFT HEART CATH AND CORS/GRAFTS ANGIOGRAPHY;  Surgeon: Leonie Man, MD;  Location: [facility];  Service: Cardiovascular;  Laterality: N/A;  POLYPECTOMY     PROSTATE SURGERY       SOCIAL HISTORY:  Social History   Socioeconomic History   Marital status: Married    Spouse name: Not on file   Number of children: Not on file   Years of education: Not on file   Highest education level: Not on file   Occupational History   Occupation: Retired    Fish farm manager: RETIRED  Tobacco Use   Smoking status: Former    Packs/day: 0.25    Years: 44.00    Pack years: 11.00    Types: Cigarettes    Quit date: 05/14/1992    Years since quitting: 28.6   Smokeless tobacco: Never  Vaping Use   Vaping Use: Never used  Substance and Sexual Activity   Alcohol use: No   Drug use: No   Sexual activity: Not on file    Comment: married 39 years  Other Topics Concern   Not on file  Social History Narrative   Not on file   Social Determinants of Health   Financial Resource Strain: Not on file  Food Insecurity: Not on file  Transportation Needs: Not on file  Physical Activity: Not on file  Stress: Not on file  Social Connections: Not on file  Intimate Partner Violence: Not on file    FAMILY HISTORY:  Family History  Problem Relation Age of Onset   Colon cancer Father    Coronary artery disease Other     CURRENT MEDICATIONS:  Outpatient Encounter Medications as of 01/17/2021  Medication Sig   acetaminophen (TYLENOL) 500 MG tablet Take 1 tablet (500 mg total) by mouth every 6 (six) hours as needed for mild pain or fever.   aspirin 81 MG chewable tablet Chew 81 mg by mouth daily.   carvedilol (COREG) 12.5 MG tablet Take 1 tablet (12.5 mg total) by mouth 2 (two) times daily with a meal.   Cholecalciferol (VITAMIN D3) 1000 UNITS CAPS Take 1 capsule by mouth daily.   cyanocobalamin (,VITAMIN B-12,) 1000 MCG/ML injection ADMINISTER 1 ML IN THE MUSCLE 1 TIME AS DIRECTED   diphenhydrAMINE (BENADRYL) 25 MG tablet Take 25 mg by mouth at bedtime as needed for sleep.   losartan (COZAAR) 50 MG tablet Take 1 tablet (50 mg total) by mouth daily.   nitroGLYCERIN (NITROSTAT) 0.4 MG SL tablet Place 1 tablet (0.4 mg total) under the tongue every 5 (five) minutes as needed.   simvastatin (ZOCOR) 40 MG tablet TAKE 1 TABLET BY MOUTH EVERY DAY IN THE EVENING   vitamin C (ASCORBIC ACID) 500 MG tablet Take 500 mg by  mouth daily.     No facility-administered encounter medications on file as of 01/17/2021.    ALLERGIES:  Allergies  Allergen Reactions   Codeine Nausea And Vomiting     PHYSICAL EXAM:  ECOG PERFORMANCE STATUS: 1 - Symptomatic but completely ambulatory  There were no vitals filed for this visit. There were no vitals filed for this visit. Physical Exam Constitutional:      Appearance: Normal appearance.  HENT:     Head: Normocephalic and atraumatic.     Mouth/Throat:     Mouth: Mucous membranes are moist.  Eyes:     Extraocular Movements: Extraocular movements intact.     Pupils: Pupils are equal, round, and reactive to light.  Cardiovascular:     Rate and Rhythm: Normal rate and regular rhythm.     Pulses: Normal pulses.     Heart sounds: Normal heart sounds.  Pulmonary:     Effort: Pulmonary effort is normal.     Breath sounds: Normal breath sounds.  Abdominal:     General: Bowel sounds are normal.     Palpations: Abdomen is soft.     Tenderness: There is no abdominal tenderness.  Musculoskeletal:        General: No swelling.     Right lower leg: No edema.     Left lower leg: No edema.  Lymphadenopathy:     Cervical: No cervical adenopathy.  Skin:    General: Skin is warm and dry.  Neurological:     General: No focal deficit present.     Mental Status: He is alert and oriented to person, place, and time.  Psychiatric:        Mood and Affect: Mood normal.        Behavior: Behavior normal.     LABORATORY DATA:  I have reviewed the labs as listed.  CBC    Component Value Date/Time   WBC 3.9 (L) 01/03/2021 1321   RBC 4.13 (L) 01/03/2021 1321   HGB 12.6 (L) 01/03/2021 1321   HCT 39.8 01/03/2021 1321   PLT 122 (L) 01/03/2021 1321   MCV 96.4 01/03/2021 1321   MCH 30.5 01/03/2021 1321   MCHC 31.7 01/03/2021 1321   RDW 13.3 01/03/2021 1321   LYMPHSABS 0.9 01/03/2021 1321   MONOABS 1.3 (H) 01/03/2021 1321   EOSABS 0.0 01/03/2021 1321   BASOSABS 0.0  01/03/2021 1321   CMP Latest Ref Rng & Units 01/03/2021 12/20/2020 12/18/2020  Glucose 70 - 99 mg/dL 142(H) 138(H) 262(H)  BUN 8 - 23 mg/dL _0 Creatinine 0.61 - 1.24 mg/dL 1.32(H) 1.05 1.13  Sodium 135 - 145 mmol/L 137 138 138  Potassium 3.5 - 5.1 mmol/L 4.6 3.7 3.6  Chloride 98 - 111 mmol/L 104 105 105  CO2 22 - 32 mmol/L _1 Calcium 8.9 - 10.3 mg/dL 8.7(L) 8.7(L) 9.0  Total Protein 6.5 - 8.1 g/dL 6.6 - -  Total Bilirubin 0.3 - 1.2 mg/dL 1.1 - -  Alkaline Phos 38 - 126 U/L 43 - -  AST 15 - 41 U/L 13(L) - -  ALT 0 - 44 U/L 11 - -    DIAGNOSTIC IMAGING:  I have independently reviewed the relevant imaging and discussed with the patient.  ASSESSMENT & PLAN: 1.  Low-grade MDS - Bone marrow biopsy on 10/27/2015 showed hypercellular marrow with mild dyserythropoiesis and dismegakaryocytes.  Chromosome analysis was normal.  FISH panel was normal.  Flow cytometry on peripheral blood did not show any monoclonal B cell population. - He has normocytic anemia which is stable between 11 and 12. - He also has moderate thrombocytopenia which is stable between 80 and 90,000. - He denies any recurrent infections, bleeding issues, or B-symptoms  - Most recent labs (01/03/2021):  Hgb 12.6, WBC 3.9 with ANC 1.7, platelets 122 - PLAN:  No significant deviation from baseline, no alarm symptoms.  We will repeat labs and see him for follow-up in 6 months.  2.  B12 deficiency: - He is taking B12 injections every 4 weeks - his wife is a retired Therapist, sports, and administers these injections at home - Most recent B12 on 01/03/2021 was 430 - PLAN: Continue monthly B12 injections at home  3.  Iron deficiency state - Labs (01/03/2021) show moderate iron deficiency with ferritin 21, TIBC 357, iron saturation 24% - PLAN: We will initiate trial of oral iron supplement  if tolerated.  We will recheck iron levels in 6 months, and consider IV iron if no improvement or if patient was unable to tolerate oral iron  supplement.   PLAN SUMMARY & DISPOSITION: - Continue monthly B12 injections - Labs in 6 months (CBC, ferritin, iron/TIBC, B12, methylmalonic acid) - RTC in 6 months after labs  All questions were answered. The patient knows to call the clinic with any problems, questions or concerns.  Medical decision making: Low  Time spent on visit: I spent 15 minutes counseling the patient face to face. The total time spent in the appointment was 25 minutes and more than 50% was on counseling.   Harriett Rush, PA-C  01/17/2021 4:31 PM

## 2021-01-17 ENCOUNTER — Inpatient Hospital Stay (HOSPITAL_COMMUNITY): Payer: Medicare Other | Attending: Hematology | Admitting: Physician Assistant

## 2021-01-17 ENCOUNTER — Encounter (HOSPITAL_COMMUNITY): Payer: Self-pay | Admitting: Physician Assistant

## 2021-01-17 VITALS — BP 146/76 | HR 74 | Temp 97.1°F | Resp 17 | Wt 162.8 lb

## 2021-01-17 DIAGNOSIS — D4620 Refractory anemia with excess of blasts, unspecified: Secondary | ICD-10-CM | POA: Diagnosis not present

## 2021-01-17 DIAGNOSIS — D46Z Other myelodysplastic syndromes: Secondary | ICD-10-CM

## 2021-01-17 DIAGNOSIS — E538 Deficiency of other specified B group vitamins: Secondary | ICD-10-CM

## 2021-01-17 NOTE — Patient Instructions (Signed)
[place] at [facility] Discharge Instructions  You were seen today by Tarri Abernethy PA-C for your myelodysplastic syndrome (MDS).  Your blood levels remain mildly low, but are stable within their baseline range.  You do not need any treatment for your MDS at this time, but we will recheck your labs in 6 months.  Your B12 levels looked great.  Continue to take B12 injections at home once a month.  Your iron levels were slightly lower than they should be.  Start taking iron supplements, as discussed below.  LABS: Return in 6 months for repeat labs  OTHER TESTS: No other tests required at this time  MEDICATIONS: START taking iron supplement over-the-counter (ferrous sulfate 325 mg daily).  This can cause upset stomach, so we recommend that you take it with food.  You can also take it with a glass of orange juice to help your body absorb it.  It may cause some constipation.  If constipation occurs, use over-the-counter stool softener such as Colace to maintain daily bowel movements.  If you have severe stomach upset or severe constipation, please stop taking iron pill.  FOLLOW-UP APPOINTMENT: Office visit in 6 months, after labs.   Thank you for choosing [place] at [facility] to provide your oncology and hematology care.  To afford each patient quality time with our provider, please arrive at least 15 minutes before your scheduled appointment time.   If you have a lab appointment with the [facility] please come in thru the Main Entrance and check in at the main information desk.  You need to re-schedule your appointment should you arrive 10 or more minutes late.  We strive to give you quality time with our providers, and arriving late affects you and other patients whose appointments are after yours.  Also, if you no show three or more times for appointments you may be dismissed from the clinic at the providers discretion.     Again,  thank you for choosing [facility].  Our hope is that these requests will decrease the amount of time that you wait before being seen by our physicians.       _____________________________________________________________  Should you have questions after your visit to [facility], please contact our office at [phone] and follow the prompts.  Our office hours are 8:00 a.m. and 4:30 p.m. Monday - Friday.  Please note that voicemails left after 4:00 p.m. may not be returned until the following business day.  We are closed weekends and major holidays.  You do have access to a nurse 24-7, just call the main number to the clinic [phone] and do not press any options, hold on the line and a nurse will answer the phone.    For prescription refill requests, have your pharmacy contact our office and allow 72 hours.    Due to Covid, you will need to wear a mask upon entering the hospital. If you do not have a mask, a mask will be given to you at the Main Entrance upon arrival. For doctor visits, patients may have 1 support person age 69 or older with them. For treatment visits, patients can not have anyone with them due to social distancing guidelines and our immunocompromised population.

## 2021-01-30 ENCOUNTER — Other Ambulatory Visit (HOSPITAL_COMMUNITY): Payer: Self-pay | Admitting: Hematology

## 2021-01-30 DIAGNOSIS — D61818 Other pancytopenia: Secondary | ICD-10-CM

## 2021-01-31 DIAGNOSIS — L570 Actinic keratosis: Secondary | ICD-10-CM | POA: Diagnosis not present

## 2021-01-31 DIAGNOSIS — L821 Other seborrheic keratosis: Secondary | ICD-10-CM | POA: Diagnosis not present

## 2021-01-31 DIAGNOSIS — Z85828 Personal history of other malignant neoplasm of skin: Secondary | ICD-10-CM | POA: Diagnosis not present

## 2021-02-01 DIAGNOSIS — R7301 Impaired fasting glucose: Secondary | ICD-10-CM | POA: Diagnosis not present

## 2021-02-01 DIAGNOSIS — E785 Hyperlipidemia, unspecified: Secondary | ICD-10-CM | POA: Diagnosis not present

## 2021-02-01 DIAGNOSIS — E538 Deficiency of other specified B group vitamins: Secondary | ICD-10-CM | POA: Diagnosis not present

## 2021-02-08 DIAGNOSIS — N1831 Chronic kidney disease, stage 3a: Secondary | ICD-10-CM | POA: Diagnosis not present

## 2021-02-08 DIAGNOSIS — R7303 Prediabetes: Secondary | ICD-10-CM | POA: Diagnosis not present

## 2021-02-08 DIAGNOSIS — J302 Other seasonal allergic rhinitis: Secondary | ICD-10-CM | POA: Diagnosis not present

## 2021-02-08 DIAGNOSIS — E782 Mixed hyperlipidemia: Secondary | ICD-10-CM | POA: Diagnosis not present

## 2021-02-08 DIAGNOSIS — E538 Deficiency of other specified B group vitamins: Secondary | ICD-10-CM | POA: Diagnosis not present

## 2021-02-08 DIAGNOSIS — D61818 Other pancytopenia: Secondary | ICD-10-CM | POA: Diagnosis not present

## 2021-02-08 DIAGNOSIS — D696 Thrombocytopenia, unspecified: Secondary | ICD-10-CM | POA: Diagnosis not present

## 2021-02-08 DIAGNOSIS — Z0001 Encounter for general adult medical examination with abnormal findings: Secondary | ICD-10-CM | POA: Diagnosis not present

## 2021-02-08 DIAGNOSIS — Z951 Presence of aortocoronary bypass graft: Secondary | ICD-10-CM | POA: Diagnosis not present

## 2021-02-08 DIAGNOSIS — N202 Calculus of kidney with calculus of ureter: Secondary | ICD-10-CM | POA: Diagnosis not present

## 2021-02-08 DIAGNOSIS — I2510 Atherosclerotic heart disease of native coronary artery without angina pectoris: Secondary | ICD-10-CM | POA: Diagnosis not present

## 2021-02-08 DIAGNOSIS — M1A0720 Idiopathic chronic gout, left ankle and foot, without tophus (tophi): Secondary | ICD-10-CM | POA: Diagnosis not present

## 2021-02-22 DIAGNOSIS — Z23 Encounter for immunization: Secondary | ICD-10-CM | POA: Diagnosis not present

## 2021-03-08 DIAGNOSIS — N1831 Chronic kidney disease, stage 3a: Secondary | ICD-10-CM | POA: Diagnosis not present

## 2021-03-08 DIAGNOSIS — R809 Proteinuria, unspecified: Secondary | ICD-10-CM | POA: Diagnosis not present

## 2021-03-09 ENCOUNTER — Other Ambulatory Visit: Payer: Self-pay | Admitting: Cardiology

## 2021-03-26 ENCOUNTER — Other Ambulatory Visit: Payer: Self-pay | Admitting: Home Health

## 2021-03-28 NOTE — Telephone Encounter (Signed)
This is a [place] 

## 2021-03-29 NOTE — Progress Notes (Signed)
Cardiology Office Note    Date:  03/30/2021   ID:  AZARIA BARTELL, DOB [birth year], MRN [id number]  PCP:  [name], MD  Cardiologist: Rozann Lesches, MD    Chief Complaint  Patient presents with   Follow-up    3 month visit    History of Present Illness:    [name] is a 87 y.o. male with past medical history of CAD (s/p CABG in 2006 with LIMA-LAD, SVG-LCx and SVG-dRCA, repeat cath in 12/2020 showing 3/3 patent grafts and likely culprit was 80% LPAV and medical management recommended), HFrEF/ICM (EF at 45-50% in 2016, at 40-45% by repeat echo in 12/2020), Medtronic ICD at [facility] (previously followed by Dr. Lovena Le and at [facility] in 2016 and not replaced due to improvement in his EF), history of prostate cancer, nephrolithiasis, MDS, HTN and HLD who presents to the office today for 5-month follow-up.   He was last examined by myself in 12/2020 and reported doing well since his recent hospitalization and denied any recurrent anginal symptoms. He was continued on his current cardiac medications with plans for repeat labs for reassessment of his renal function as creatinine was elevated to 1.32 when checked that day.   In talking with the patient and his wife today, he reports feeling weak since his hospitalization earlier this year and reports the weakness is mostly isolated to his legs. He does use a cane for ambulation when needed. His wife reports he sits in his recliner for 12 to 14 hours a day and is not active. They do not leave the house as much since the pandemic started in 2020. He denies any specific chest pain or palpitations. He does have baseline dyspnea on exertion along with intermittent lower extremity edema. They do try to limit their sodium intake but he reports adding salt to food on occasion.   Past Medical History:  Diagnosis Date   BPH (benign prostatic hypertrophy)    Chronic systolic heart failure (HCC)    Coronary artery disease    a. Multivessel status post  CABG 8/06, LIMA-LAD, SVG-CFX, SVG-distal RCA b. 12/2020: cath showing 3/3 patent grafts with 80% stenosis along LPAV prior to bifurication and medical management recommended.   Essential hypertension    Hyperlipidemia    ICD (implantable cardiac defibrillator) in place    Medtronic   Ischemic cardiomyopathy    Myelodysplasia, low grade ([place]) 11/27/2015   Nephrolithiasis    Prostate cancer ([place])    Vitamin B 12 deficiency 07/27/2016    Past Surgical History:  Procedure Laterality Date   CARDIAC DEFIBRILLATOR PLACEMENT     COLONOSCOPY     COLONOSCOPY N/A 08/19/2013   Procedure: COLONOSCOPY;  Surgeon: Rogene Houston, MD;  Location: [facility];  Service: Endoscopy;  Laterality: N/A;  22   CORONARY ARTERY BYPASS GRAFT     8/06 with left anterior descending artery, saphenous vein graft to circumflex, saphenous vein graft to distal right coronary artery   CYSTOSCOPY/URETEROSCOPY/HOLMIUM LASER/STENT PLACEMENT Right 11/07/2020   Procedure: CYSTOSCOPY/URETEROSCOPY/HOLMIUM LASER/STENT PLACEMENT;  Surgeon: Alexis Frock, MD;  Location: [facility] ORS;  Service: Urology;  Laterality: Right;   HERNIA REPAIR     LEFT HEART CATH AND CORS/GRAFTS ANGIOGRAPHY N/A 12/19/2020   Procedure: LEFT HEART CATH AND CORS/GRAFTS ANGIOGRAPHY;  Surgeon: Leonie Man, MD;  Location: [facility];  Service: Cardiovascular;  Laterality: N/A;   POLYPECTOMY     PROSTATE SURGERY      Current Medications: Outpatient Medications Prior  to Visit  Medication Sig Dispense Refill   acetaminophen (TYLENOL) 500 MG tablet Take 1 tablet (500 mg total) by mouth every 6 (six) hours as needed for mild pain or fever. 30 tablet 0   aspirin 81 MG chewable tablet Chew 81 mg by mouth daily.     Cholecalciferol (VITAMIN D3) 1000 UNITS CAPS Take 1 capsule by mouth daily.     cyanocobalamin (,VITAMIN B-12,) 1000 MCG/ML injection ADMINISTER 1 ML IN THE MUSCLE 1 TIME AS DIRECTED 1 mL 14   diphenhydrAMINE (BENADRYL) 25 MG tablet Take 25  mg by mouth at bedtime as needed for sleep.     losartan (COZAAR) 50 MG tablet Take 1 tablet (50 mg total) by mouth daily. (Patient taking differently: Take 50 mg by mouth daily. Pt is taking 25 mg currently due to insurance issue) 90 tablet 1   nitroGLYCERIN (NITROSTAT) 0.4 MG SL tablet Place 1 tablet (0.4 mg total) under the tongue every 5 (five) minutes as needed. 25 tablet 3   simvastatin (ZOCOR) 40 MG tablet TAKE 1 TABLET BY MOUTH EVERY DAY IN THE EVENING 90 tablet 2   vitamin C (ASCORBIC ACID) 500 MG tablet Take 500 mg by mouth daily.       carvedilol (COREG) 12.5 MG tablet TAKE 1 TABLET(12.5 MG) BY MOUTH TWICE DAILY WITH A MEAL 60 tablet 6   No facility-administered medications prior to visit.     Allergies:   Codeine   Social History   Socioeconomic History   Marital status: Married    Spouse name: Not on file   Number of children: Not on file   Years of education: Not on file   Highest education level: Not on file  Occupational History   Occupation: Retired    Fish farm manager: RETIRED  Tobacco Use   Smoking status: Former    Packs/day: 0.25    Years: 44.00    Pack years: 11.00    Types: Cigarettes    Quit date: 05/14/1992    Years since quitting: 28.8   Smokeless tobacco: Never  Vaping Use   Vaping Use: Never used  Substance and Sexual Activity   Alcohol use: No   Drug use: No   Sexual activity: Not on file    Comment: married 23 years  Other Topics Concern   Not on file  Social History Narrative   Not on file   Social Determinants of Health   Financial Resource Strain: Not on file  Food Insecurity: Not on file  Transportation Needs: Not on file  Physical Activity: Not on file  Stress: Not on file  Social Connections: Not on file     Family History:  The patient's family history includes Colon cancer in his father; Coronary artery disease in an other family member.   Review of Systems:    Please see the history of present illness.     All other systems  reviewed and are otherwise negative except as noted above.   Physical Exam:    VS:  BP 130/64   Pulse 70   Ht 5\' 4"  (1.626 m)   Wt 168 lb (76.2 kg)   SpO2 95%   BMI 28.84 kg/m    General: Pleasant elderly male appearing in no acute distress. Head: Normocephalic, atraumatic. Neck: No carotid bruits. JVD not elevated.  Lungs: Respirations regular and unlabored, without wheezes or rales.  Heart: Regular rate and rhythm. No S3 or S4.  No murmur, no rubs, or gallops appreciated. Abdomen: Appears non-distended.  No obvious abdominal masses. Msk:  Strength and tone appear normal for age. No obvious joint deformities or effusions. Extremities: No clubbing or cyanosis. Trace lower extremity edema bilaterally.  Distal pedal pulses are 2+ bilaterally. Neuro: Alert and oriented X 3. Moves all extremities spontaneously. No focal deficits noted. Psych:  Responds to questions appropriately with a normal affect. Skin: No rashes or lesions noted  Wt Readings from Last 3 Encounters:  03/30/21 168 lb (76.2 kg)  01/17/21 162 lb 12.8 oz (73.8 kg)  01/03/21 162 lb (73.5 kg)     Studies/Labs Reviewed:   EKG:  EKG is not ordered today.    Recent Labs: 11/07/2020: Magnesium 1.8 01/03/2021: ALT 11; BUN 20; Creatinine, Ser 1.32; Hemoglobin 12.6; Platelets 122; Potassium 4.6; Sodium 137   Lipid Panel    Component Value Date/Time   CHOL 125 12/17/2020 0137   TRIG 71 12/17/2020 0137   HDL 29 (L) 12/17/2020 0137   CHOLHDL 4.3 12/17/2020 0137   VLDL 14 12/17/2020 0137   LDLCALC 82 12/17/2020 0137   LDLCALC 78 06/09/2019 0808    Additional studies/ records that were reviewed today include:   Echocardiogram: 12/2020 IMPRESSIONS   1. Poor acoustic windows.   2. Poor acoustic windows limit study Endocardium is not seen in all  segments even with DEFINITY There appears to be hypokinesis of the basal  inferior, basal inferoseptal, the inferolaterall and apical walls OVerall  LVEF appears mildly  depressed LVEF 40  to 45%, again EF difficult given poor image quality. . The left  ventricular internal cavity size was severely dilated. There is mild left  ventricular hypertrophy. Left ventricular diastolic parameters are  indeterminate.   3. Device lead seen in RV to distal apex. Right ventricular systolic  function is normal. The right ventricular size is normal. There is  moderately elevated pulmonary artery systolic pressure.   4. Left atrial size was severely dilated.   5. The mitral valve is normal in structure. Mild mitral valve  regurgitation.   6. The aortic valve is tricuspid. Aortic valve regurgitation is not  visualized. Mild to moderate aortic valve sclerosis/calcification is  present, without any evidence of aortic stenosis.   7. The inferior vena cava is dilated in size with <50% respiratory  variability, suggesting right atrial pressure of 15 mmHg.   Cardiac Catheterization: 12/2020 Prox RCA to Dist RCA lesion is 100% stenosed with 100% stenosed side branch in RV Branch.   Mid LM to Prox LAD lesion is 60% stenosed.   Prox Cx to Mid Cx lesion is 100% stenosed.   Prox LAD to Mid LAD lesion is 80% stenosed with 95% stenosed side branch in 2nd Diag.   Mid LAD lesion is 100% stenosed with 75% stenosed side branch in 3rd Diag.   ** LPAV lesion is 80% stenosed. -Fills via retrograde flow from SVG-OM 2.  Not approachable percutaneously.   LIMA-LAD and is normal in caliber.  The graft exhibits no disease.   SVG-2nd Mrg graft was visualized by angiography and is large.  The graft exhibits no disease.   SVG-dRCA and is normal in caliber.  The graft exhibits no disease.   ----------------------   LV end diastolic pressure is moderately elevated.   There is no aortic valve stenosis.   SUMMARY Severe native CAD: 50-60% LEFT MAIN,  100% flush ostial RCA occlusion, 100 % proximal LCx occlusion after small OM1/RI,  Diffuse 80% proximal-mid followed by 100% mid LAD occlusion 3  of 3 patent grafts:  LIMA-LAD, SVG-OM 2, SVG-RCA. Likely culprit lesion is 80% stenosis in the distal AVG LCx prior to bifurcation into LPL 1 and LPL 2 -> this fills via retrograde flow from the SVG-OM 2.  Not approachable from percutaneous option. Moderately elevated LAP.   RECOMMENDATIONS Optimize medical management.  Assessment:    1. Coronary artery disease involving native coronary artery of native heart without angina pectoris   2. Chronic systolic heart failure ([place])   3. ICD (implantable cardioverter-defibrillator) in place   4. Essential hypertension   5. Medication management   6. Stage 3a chronic kidney disease ([place])      Plan:   In order of problems listed above:  1. CAD - He is s/p CABG in 2006 with LIMA-LAD, SVG-LCx and SVG-dRCA and repeat cath in 12/2020 showed 3/3 patent grafts and likely culprit was 80% LPAV and medical management was recommended. - He does have baseline dyspnea on exertion but denies any recurrent chest pain. We did review ways for him to gradually increase his activity level around the house as I suspect deconditioning is contributing to his symptoms of lower extremity weakness. - Continue current medication regimen with ASA 81 mg daily, Coreg 12.5 mg twice daily and Simvastatin 40 mg daily (previously switched to Crestor but wished to resume his prior statin).  2. HFmrEF/ICM - His EF at 45-50% in 2016, at 40-45% by repeat echo in 12/2020. He has experienced dyspnea on exertion and worsening lower extremity edema over the past few months. He is currently on Coreg 12.5 mg twice daily and Losartan 50 mg daily. Recent labs from his PCP on 03/08/2021 showed his creatinine had improved to 1.2. I did recommend that we restart Spironolactone but at a lower dose of 12.5 mg daily to help with his cardiomyopathy and edema. Will plan for follow-up labs within the next several weeks and if renal function and electrolytes remain stable, can further titrate to his  prior dose of 25 mg daily. He has not been started on SGLT2 inhibitor given prior nephrolithiasis and UTI's.  3.  ICD in place - He does have an ICD in place but this is no longer working as it approached ERI in 2016. Most recent echo showed his EF was stable at 40 to 45%.  4. HTN - His blood pressure is at 130/64 during today's visit. Will continue Coreg 12.5 mg twice daily and Losartan 50 mg daily with plans to restart Spironolactone at a lower dose of 12.5 mg daily as outlined above.  5. Stage 3 CKD - His creatinine was previously at 3.4 in 10/2020 but this was in the setting of nephrolithiasis. Was elevated to 1.32 in 12/2020 and improved to 1.2 by most recent labs last month. Will plan for a repeat BMET in several weeks following resumption of Spironolactone (he requests to have labs after Thanksgiving).    Medication Adjustments/Labs and Tests Ordered: Current medicines are reviewed at length with the patient today.  Concerns regarding medicines are outlined above.  Medication changes, Labs and Tests ordered today are listed in the Patient Instructions below. Patient Instructions  Medication Instructions:   Restart Spironolactone 12.5 mg Daily   *If you need a refill on your cardiac medications before your next appointment, please call your pharmacy*   Lab Work: Your physician recommends that you return for lab work in: 1 Month (04/28/21)   If you have labs (blood work) drawn today and your tests are completely normal, you will receive your results only by:  MyChart Message (if you have MyChart) OR A paper copy in the mail If you have any lab test that is abnormal or we need to change your treatment, we will call you to review the results.   Testing/Procedures: NONE    Follow-Up: At [facility], you and your health needs are our priority.  As part of our continuing mission to provide you with exceptional heart care, we have created designated Provider Care Teams.  These  Care Teams include your primary Cardiologist (physician) and Advanced Practice Providers (APPs -  Physician Assistants and Nurse Practitioners) who all work together to provide you with the care you need, when you need it.  We recommend signing up for the patient portal called "MyChart".  Sign up information is provided on this After Visit Summary.  MyChart is used to connect with patients for Virtual Visits (Telemedicine).  Patients are able to view lab/test results, encounter notes, upcoming appointments, etc.  Non-urgent messages can be sent to your provider as well.   To learn more about what you can do with MyChart, go to [email or web address].    Your next appointment:   3 month(s)  The format for your next appointment:   In Person  Provider:   Rozann Lesches, MD    Other Instructions Thank you for choosing [facility]!     Signed, Luke Heritage, PA-C  03/30/2021 5:13 PM    [facility] [address] [place], [place] [address] Phone: [phone] Fax: [phone]

## 2021-03-30 ENCOUNTER — Ambulatory Visit (INDEPENDENT_AMBULATORY_CARE_PROVIDER_SITE_OTHER): Payer: Medicare Other | Admitting: Student

## 2021-03-30 ENCOUNTER — Encounter: Payer: Self-pay | Admitting: Student

## 2021-03-30 ENCOUNTER — Other Ambulatory Visit: Payer: Self-pay

## 2021-03-30 VITALS — BP 130/64 | HR 70 | Ht 64.0 in | Wt 168.0 lb

## 2021-03-30 DIAGNOSIS — I10 Essential (primary) hypertension: Secondary | ICD-10-CM

## 2021-03-30 DIAGNOSIS — Z95810 Presence of automatic (implantable) cardiac defibrillator: Secondary | ICD-10-CM

## 2021-03-30 DIAGNOSIS — I200 Unstable angina: Secondary | ICD-10-CM | POA: Diagnosis not present

## 2021-03-30 DIAGNOSIS — Z79899 Other long term (current) drug therapy: Secondary | ICD-10-CM | POA: Diagnosis not present

## 2021-03-30 DIAGNOSIS — I2510 Atherosclerotic heart disease of native coronary artery without angina pectoris: Secondary | ICD-10-CM | POA: Diagnosis not present

## 2021-03-30 DIAGNOSIS — I5022 Chronic systolic (congestive) heart failure: Secondary | ICD-10-CM

## 2021-03-30 DIAGNOSIS — N1831 Chronic kidney disease, stage 3a: Secondary | ICD-10-CM

## 2021-03-30 MED ORDER — SPIRONOLACTONE 25 MG PO TABS: 12.5000 mg | ORAL_TABLET | Freq: Every day | ORAL | 3 refills | Status: DC

## 2021-03-30 MED ORDER — CARVEDILOL 12.5 MG PO TABS: 12.5000 mg | ORAL_TABLET | Freq: Two times a day (BID) | ORAL | 3 refills | Status: DC

## 2021-03-30 NOTE — Patient Instructions (Signed)
Medication Instructions:   Restart Spironolactone 12.5 mg Daily   *If you need a refill on your cardiac medications before your next appointment, please call your pharmacy*   Lab Work: Your physician recommends that you return for lab work in: 1 Month (04/28/21)   If you have labs (blood work) drawn today and your tests are completely normal, you will receive your results only by: [place] (if you have MyChart) OR A paper copy in the mail If you have any lab test that is abnormal or we need to change your treatment, we will call you to review the results.   Testing/Procedures: NONE    Follow-Up: At [facility], you and your health needs are our priority.  As part of our continuing mission to provide you with exceptional heart care, we have created designated Provider Care Teams.  These Care Teams include your primary Cardiologist (physician) and Advanced Practice Providers (APPs -  Physician Assistants and Nurse Practitioners) who all work together to provide you with the care you need, when you need it.  We recommend signing up for the patient portal called "MyChart".  Sign up information is provided on this After Visit Summary.  MyChart is used to connect with patients for Virtual Visits (Telemedicine).  Patients are able to view lab/test results, encounter notes, upcoming appointments, etc.  Non-urgent messages can be sent to your provider as well.   To learn more about what you can do with MyChart, go to [email or web address].    Your next appointment:   3 month(s)  The format for your next appointment:   In Person  Provider:   Rozann Lesches, MD    Other Instructions Thank you for choosing [facility]!

## 2021-03-31 ENCOUNTER — Other Ambulatory Visit (HOSPITAL_COMMUNITY): Payer: Self-pay | Admitting: Hematology

## 2021-03-31 DIAGNOSIS — D61818 Other pancytopenia: Secondary | ICD-10-CM

## 2021-04-12 ENCOUNTER — Encounter (HOSPITAL_COMMUNITY): Payer: Self-pay

## 2021-04-12 ENCOUNTER — Emergency Department (HOSPITAL_COMMUNITY): Payer: Medicare Other

## 2021-04-12 ENCOUNTER — Other Ambulatory Visit: Payer: Self-pay

## 2021-04-12 ENCOUNTER — Inpatient Hospital Stay (HOSPITAL_COMMUNITY)
Admission: EM | Admit: 2021-04-12 | Discharge: 2021-04-25 | DRG: 853 | Disposition: A | Discharge status: Skilled Nursing Facility | Payer: Medicare Other | Attending: Pulmonary Disease | Admitting: Pulmonary Disease

## 2021-04-12 DIAGNOSIS — N179 Acute kidney failure, unspecified: Secondary | ICD-10-CM | POA: Diagnosis present

## 2021-04-12 DIAGNOSIS — Z66 Do not resuscitate: Secondary | ICD-10-CM | POA: Diagnosis not present

## 2021-04-12 DIAGNOSIS — J90 Pleural effusion, not elsewhere classified: Secondary | ICD-10-CM | POA: Diagnosis not present

## 2021-04-12 DIAGNOSIS — R001 Bradycardia, unspecified: Secondary | ICD-10-CM | POA: Diagnosis not present

## 2021-04-12 DIAGNOSIS — I5023 Acute on chronic systolic (congestive) heart failure: Secondary | ICD-10-CM

## 2021-04-12 DIAGNOSIS — R6521 Severe sepsis with septic shock: Secondary | ICD-10-CM | POA: Diagnosis not present

## 2021-04-12 DIAGNOSIS — Z7982 Long term (current) use of aspirin: Secondary | ICD-10-CM

## 2021-04-12 DIAGNOSIS — Z951 Presence of aortocoronary bypass graft: Secondary | ICD-10-CM | POA: Diagnosis not present

## 2021-04-12 DIAGNOSIS — N1831 Chronic kidney disease, stage 3a: Secondary | ICD-10-CM | POA: Diagnosis not present

## 2021-04-12 DIAGNOSIS — E861 Hypovolemia: Secondary | ICD-10-CM | POA: Diagnosis present

## 2021-04-12 DIAGNOSIS — R1312 Dysphagia, oropharyngeal phase: Secondary | ICD-10-CM | POA: Diagnosis present

## 2021-04-12 DIAGNOSIS — N1330 Unspecified hydronephrosis: Secondary | ICD-10-CM | POA: Diagnosis not present

## 2021-04-12 DIAGNOSIS — Z800 Family history of malignant neoplasm of digestive organs: Secondary | ICD-10-CM

## 2021-04-12 DIAGNOSIS — E871 Hypo-osmolality and hyponatremia: Secondary | ICD-10-CM | POA: Diagnosis not present

## 2021-04-12 DIAGNOSIS — I5041 Acute combined systolic (congestive) and diastolic (congestive) heart failure: Secondary | ICD-10-CM | POA: Diagnosis not present

## 2021-04-12 DIAGNOSIS — I517 Cardiomegaly: Secondary | ICD-10-CM | POA: Diagnosis not present

## 2021-04-12 DIAGNOSIS — Z8249 Family history of ischemic heart disease and other diseases of the circulatory system: Secondary | ICD-10-CM

## 2021-04-12 DIAGNOSIS — E1122 Type 2 diabetes mellitus with diabetic chronic kidney disease: Secondary | ICD-10-CM | POA: Diagnosis present

## 2021-04-12 DIAGNOSIS — Z79899 Other long term (current) drug therapy: Secondary | ICD-10-CM

## 2021-04-12 DIAGNOSIS — Z950 Presence of cardiac pacemaker: Secondary | ICD-10-CM | POA: Diagnosis not present

## 2021-04-12 DIAGNOSIS — I480 Paroxysmal atrial fibrillation: Secondary | ICD-10-CM | POA: Diagnosis not present

## 2021-04-12 DIAGNOSIS — R0602 Shortness of breath: Secondary | ICD-10-CM | POA: Diagnosis not present

## 2021-04-12 DIAGNOSIS — I2510 Atherosclerotic heart disease of native coronary artery without angina pectoris: Secondary | ICD-10-CM | POA: Diagnosis present

## 2021-04-12 DIAGNOSIS — Z452 Encounter for adjustment and management of vascular access device: Secondary | ICD-10-CM

## 2021-04-12 DIAGNOSIS — R600 Localized edema: Secondary | ICD-10-CM

## 2021-04-12 DIAGNOSIS — I509 Heart failure, unspecified: Secondary | ICD-10-CM

## 2021-04-12 DIAGNOSIS — E875 Hyperkalemia: Secondary | ICD-10-CM | POA: Diagnosis not present

## 2021-04-12 DIAGNOSIS — I5043 Acute on chronic combined systolic (congestive) and diastolic (congestive) heart failure: Secondary | ICD-10-CM | POA: Diagnosis present

## 2021-04-12 DIAGNOSIS — I255 Ischemic cardiomyopathy: Secondary | ICD-10-CM | POA: Diagnosis present

## 2021-04-12 DIAGNOSIS — Z95810 Presence of automatic (implantable) cardiac defibrillator: Secondary | ICD-10-CM | POA: Diagnosis present

## 2021-04-12 DIAGNOSIS — I10 Essential (primary) hypertension: Secondary | ICD-10-CM | POA: Diagnosis present

## 2021-04-12 DIAGNOSIS — Z515 Encounter for palliative care: Secondary | ICD-10-CM | POA: Diagnosis not present

## 2021-04-12 DIAGNOSIS — D696 Thrombocytopenia, unspecified: Secondary | ICD-10-CM | POA: Diagnosis present

## 2021-04-12 DIAGNOSIS — I130 Hypertensive heart and chronic kidney disease with heart failure and stage 1 through stage 4 chronic kidney disease, or unspecified chronic kidney disease: Secondary | ICD-10-CM | POA: Diagnosis present

## 2021-04-12 DIAGNOSIS — I428 Other cardiomyopathies: Secondary | ICD-10-CM | POA: Diagnosis not present

## 2021-04-12 DIAGNOSIS — Z885 Allergy status to narcotic agent status: Secondary | ICD-10-CM

## 2021-04-12 DIAGNOSIS — I444 Left anterior fascicular block: Secondary | ICD-10-CM | POA: Diagnosis not present

## 2021-04-12 DIAGNOSIS — R0603 Acute respiratory distress: Secondary | ICD-10-CM

## 2021-04-12 DIAGNOSIS — I452 Bifascicular block: Secondary | ICD-10-CM | POA: Diagnosis present

## 2021-04-12 DIAGNOSIS — Z6828 Body mass index (BMI) 28.0-28.9, adult: Secondary | ICD-10-CM

## 2021-04-12 DIAGNOSIS — J9601 Acute respiratory failure with hypoxia: Secondary | ICD-10-CM | POA: Diagnosis not present

## 2021-04-12 DIAGNOSIS — J9602 Acute respiratory failure with hypercapnia: Secondary | ICD-10-CM | POA: Diagnosis not present

## 2021-04-12 DIAGNOSIS — L8990 Pressure ulcer of unspecified site, unspecified stage: Secondary | ICD-10-CM | POA: Insufficient documentation

## 2021-04-12 DIAGNOSIS — E785 Hyperlipidemia, unspecified: Secondary | ICD-10-CM | POA: Diagnosis present

## 2021-04-12 DIAGNOSIS — K5900 Constipation, unspecified: Secondary | ICD-10-CM | POA: Diagnosis present

## 2021-04-12 DIAGNOSIS — I4510 Unspecified right bundle-branch block: Secondary | ICD-10-CM | POA: Diagnosis not present

## 2021-04-12 DIAGNOSIS — Z20822 Contact with and (suspected) exposure to covid-19: Secondary | ICD-10-CM | POA: Diagnosis not present

## 2021-04-12 DIAGNOSIS — I5022 Chronic systolic (congestive) heart failure: Secondary | ICD-10-CM | POA: Diagnosis present

## 2021-04-12 DIAGNOSIS — N400 Enlarged prostate without lower urinary tract symptoms: Secondary | ICD-10-CM | POA: Diagnosis present

## 2021-04-12 DIAGNOSIS — R000 Tachycardia, unspecified: Secondary | ICD-10-CM | POA: Diagnosis not present

## 2021-04-12 DIAGNOSIS — R262 Difficulty in walking, not elsewhere classified: Secondary | ICD-10-CM | POA: Diagnosis present

## 2021-04-12 DIAGNOSIS — M2550 Pain in unspecified joint: Secondary | ICD-10-CM | POA: Diagnosis not present

## 2021-04-12 DIAGNOSIS — J189 Pneumonia, unspecified organism: Secondary | ICD-10-CM | POA: Diagnosis present

## 2021-04-12 DIAGNOSIS — E440 Moderate protein-calorie malnutrition: Secondary | ICD-10-CM | POA: Diagnosis not present

## 2021-04-12 DIAGNOSIS — R0902 Hypoxemia: Secondary | ICD-10-CM | POA: Diagnosis not present

## 2021-04-12 DIAGNOSIS — I5033 Acute on chronic diastolic (congestive) heart failure: Secondary | ICD-10-CM | POA: Diagnosis present

## 2021-04-12 DIAGNOSIS — R0600 Dyspnea, unspecified: Secondary | ICD-10-CM | POA: Diagnosis not present

## 2021-04-12 DIAGNOSIS — N281 Cyst of kidney, acquired: Secondary | ICD-10-CM | POA: Diagnosis not present

## 2021-04-12 DIAGNOSIS — Z7401 Bed confinement status: Secondary | ICD-10-CM | POA: Diagnosis not present

## 2021-04-12 DIAGNOSIS — J181 Lobar pneumonia, unspecified organism: Secondary | ICD-10-CM | POA: Diagnosis present

## 2021-04-12 DIAGNOSIS — A419 Sepsis, unspecified organism: Secondary | ICD-10-CM | POA: Diagnosis not present

## 2021-04-12 DIAGNOSIS — M7989 Other specified soft tissue disorders: Secondary | ICD-10-CM

## 2021-04-12 DIAGNOSIS — E1165 Type 2 diabetes mellitus with hyperglycemia: Secondary | ICD-10-CM | POA: Diagnosis present

## 2021-04-12 DIAGNOSIS — I4891 Unspecified atrial fibrillation: Secondary | ICD-10-CM | POA: Diagnosis not present

## 2021-04-12 DIAGNOSIS — Z87891 Personal history of nicotine dependence: Secondary | ICD-10-CM

## 2021-04-12 DIAGNOSIS — I442 Atrioventricular block, complete: Secondary | ICD-10-CM | POA: Diagnosis not present

## 2021-04-12 DIAGNOSIS — R069 Unspecified abnormalities of breathing: Secondary | ICD-10-CM | POA: Diagnosis not present

## 2021-04-12 DIAGNOSIS — R609 Edema, unspecified: Secondary | ICD-10-CM | POA: Diagnosis not present

## 2021-04-12 DIAGNOSIS — I25110 Atherosclerotic heart disease of native coronary artery with unstable angina pectoris: Secondary | ICD-10-CM | POA: Diagnosis not present

## 2021-04-12 DIAGNOSIS — S2231XA Fracture of one rib, right side, initial encounter for closed fracture: Secondary | ICD-10-CM | POA: Diagnosis not present

## 2021-04-12 DIAGNOSIS — Z8546 Personal history of malignant neoplasm of prostate: Secondary | ICD-10-CM

## 2021-04-12 DIAGNOSIS — Z7189 Other specified counseling: Secondary | ICD-10-CM | POA: Diagnosis not present

## 2021-04-12 DIAGNOSIS — R531 Weakness: Secondary | ICD-10-CM | POA: Diagnosis not present

## 2021-04-12 DIAGNOSIS — M6281 Muscle weakness (generalized): Secondary | ICD-10-CM | POA: Diagnosis present

## 2021-04-12 DIAGNOSIS — I455 Other specified heart block: Secondary | ICD-10-CM | POA: Diagnosis present

## 2021-04-12 LAB — COMPREHENSIVE METABOLIC PANEL
ALT: 36 U/L (ref 0–44)
AST: 42 U/L — ABNORMAL HIGH (ref 15–41)
Albumin: 3.7 g/dL (ref 3.5–5.0)
Alkaline Phosphatase: 72 U/L (ref 38–126)
Anion gap: 10 (ref 5–15)
BUN: 24 mg/dL — ABNORMAL HIGH (ref 8–23)
CO2: 26 mmol/L (ref 22–32)
Calcium: 9 mg/dL (ref 8.9–10.3)
Chloride: 101 mmol/L (ref 98–111)
Creatinine, Ser: 1.53 mg/dL — ABNORMAL HIGH (ref 0.61–1.24)
GFR, Estimated: 42 mL/min — ABNORMAL LOW (ref >60)
Glucose, Bld: 209 mg/dL — ABNORMAL HIGH (ref 70–99)
Potassium: 3.7 mmol/L (ref 3.5–5.1)
Sodium: 137 mmol/L (ref 135–145)
Total Bilirubin: 2.3 mg/dL — ABNORMAL HIGH (ref 0.3–1.2)
Total Protein: 6.4 g/dL — ABNORMAL LOW (ref 6.5–8.1)

## 2021-04-12 LAB — BRAIN NATRIURETIC PEPTIDE: B Natriuretic Peptide: 1568 pg/mL — ABNORMAL HIGH (ref 0.0–100.0)

## 2021-04-12 LAB — CBC WITH DIFFERENTIAL/PLATELET
Abs Immature Granulocytes: 0.03 10*3/uL (ref 0.00–0.07)
Basophils Absolute: 0 10*3/uL (ref 0.0–0.1)
Basophils Relative: 0 %
Eosinophils Absolute: 0.1 10*3/uL (ref 0.0–0.5)
Eosinophils Relative: 1 %
HCT: 42.8 % (ref 39.0–52.0)
Hemoglobin: 13.8 g/dL (ref 13.0–17.0)
Immature Granulocytes: 1 %
Lymphocytes Relative: 7 %
Lymphs Abs: 0.4 10*3/uL — ABNORMAL LOW (ref 0.7–4.0)
MCH: 30.5 pg (ref 26.0–34.0)
MCHC: 32.2 g/dL (ref 30.0–36.0)
MCV: 94.7 fL (ref 80.0–100.0)
Monocytes Absolute: 2.9 10*3/uL — ABNORMAL HIGH (ref 0.1–1.0)
Monocytes Relative: 48 %
Neutro Abs: 2.5 10*3/uL (ref 1.7–7.7)
Neutrophils Relative %: 43 %
Platelets: 86 10*3/uL — ABNORMAL LOW (ref 150–400)
RBC: 4.52 MIL/uL (ref 4.22–5.81)
RDW: 13.7 % (ref 11.5–15.5)
WBC: 5.9 10*3/uL (ref 4.0–10.5)
nRBC: 0 % (ref 0.0–0.2)

## 2021-04-12 LAB — MRSA NEXT GEN BY PCR, NASAL: MRSA by PCR Next Gen: NOT DETECTED

## 2021-04-12 LAB — RESP PANEL BY RT-PCR (FLU A&B, COVID) ARPGX2
Influenza A by PCR: NEGATIVE
Influenza B by PCR: NEGATIVE
SARS Coronavirus 2 by RT PCR: NEGATIVE

## 2021-04-12 LAB — TROPONIN I (HIGH SENSITIVITY)
Troponin I (High Sensitivity): 162 ng/L (ref <18)
Troponin I (High Sensitivity): 184 ng/L (ref <18)

## 2021-04-12 MED ORDER — FUROSEMIDE 10 MG/ML IJ SOLN
40.0000 mg | Freq: Once | INTRAMUSCULAR | Status: AC
  Administered 2021-04-12: 40 mg via INTRAVENOUS
  Filled 2021-04-12: qty 4

## 2021-04-12 MED ORDER — ACETAMINOPHEN 650 MG RE SUPP
650.0000 mg | Freq: Four times a day (QID) | RECTAL | Status: DC | PRN
  Administered 2021-04-13: 04:00:00 650 mg via RECTAL
  Filled 2021-04-12: qty 1

## 2021-04-12 MED ORDER — GUAIFENESIN-DM 100-10 MG/5ML PO SYRP
10.0000 mL | ORAL_SOLUTION | Freq: Three times a day (TID) | ORAL | Status: AC
  Administered 2021-04-12 – 2021-04-13 (×3): 10 mL via ORAL
  Filled 2021-04-12 (×3): qty 10

## 2021-04-12 MED ORDER — IPRATROPIUM-ALBUTEROL 0.5-2.5 (3) MG/3ML IN SOLN
3.0000 mL | Freq: Four times a day (QID) | RESPIRATORY_TRACT | Status: AC
  Administered 2021-04-12 – 2021-04-13 (×2): 3 mL via RESPIRATORY_TRACT
  Filled 2021-04-12 (×3): qty 3

## 2021-04-12 MED ORDER — FUROSEMIDE 10 MG/ML IJ SOLN
40.0000 mg | Freq: Once | INTRAMUSCULAR | Status: DC
Start: 2021-04-12 — End: 2021-04-12

## 2021-04-12 MED ORDER — IPRATROPIUM-ALBUTEROL 0.5-2.5 (3) MG/3ML IN SOLN
3.0000 mL | RESPIRATORY_TRACT | Status: DC | PRN
  Administered 2021-04-13 – 2021-04-17 (×4): 3 mL via RESPIRATORY_TRACT
  Filled 2021-04-12 (×3): qty 3

## 2021-04-12 MED ORDER — LOSARTAN POTASSIUM 50 MG PO TABS: 50.0000 mg | ORAL_TABLET | Freq: Every day | ORAL | Status: DC

## 2021-04-12 MED ORDER — ORAL CARE MOUTH RINSE
15.0000 mL | Freq: Two times a day (BID) | OROMUCOSAL | Status: DC
  Administered 2021-04-12 – 2021-04-25 (×23): 15 mL via OROMUCOSAL

## 2021-04-12 MED ORDER — ENSURE ENLIVE PO LIQD
237.0000 mL | Freq: Two times a day (BID) | ORAL | Status: DC
  Administered 2021-04-13 – 2021-04-25 (×9): 237 mL via ORAL

## 2021-04-12 MED ORDER — ACETAMINOPHEN 325 MG PO TABS
650.0000 mg | ORAL_TABLET | Freq: Four times a day (QID) | ORAL | Status: DC | PRN
  Administered 2021-04-19 – 2021-04-25 (×2): 650 mg via ORAL
  Filled 2021-04-12 (×2): qty 2

## 2021-04-12 MED ORDER — CARVEDILOL 12.5 MG PO TABS: 12.5000 mg | ORAL_TABLET | Freq: Two times a day (BID) | ORAL | Status: DC

## 2021-04-12 MED ORDER — SPIRONOLACTONE 25 MG PO TABS
12.5000 mg | ORAL_TABLET | Freq: Every day | ORAL | Status: DC
  Filled 2021-04-12 (×2): qty 1

## 2021-04-12 NOTE — ED Triage Notes (Signed)
Pt via EMS from home. Patient began having shortness of breath this morning that progressed. Bilateral lower limb edema noted that patient did not have prior to this episode. EMS advised oxygen saturation of 60% on room air with good waveform. Patient placed on 15 LPM non-rebreather and oxygen saturation came up to 96%.

## 2021-04-12 NOTE — H&P (Addendum)
History and Physical    EDWING [name] HGD:[id number] DOB: Aug 15, 1926 DOA: 04/12/2021  PCP: Luke Squibb, MD   Patient coming from: Home  I have personally briefly reviewed patient's old medical records in [place]  Chief Complaint: Difficulty breathing  HPI: [name] is a 87 y.o. male with medical history significant for cardiomyopathy, hypertension, systolic CHF, coronary artery disease. Patient was brought to the ED via EMS with complaints of difficulty breathing that started yesterday evening, and worsened this morning.  Spouse is at bedside and assist with the history.  He has bilateral lower extremity swelling, spouse and wife are not sure how long she has had this. Not on Lasix.  Spouse also reports patient has had a very bad cough, productive of cream colored phlegm. EMS reports patient's O2 sats was 60% on room air.  He was placed on 15 L nonrebreather with sats improving to 96%  ED Course:  temp 98.7.  Heart rate 60s to 116.  Respiratory rate 19-33.  Blood pressure systolic recorded at 96/22 in the ED, currently 119/45.  Patient was placed on BiPAP.  BNP elevated at 1568.  WBC 5.9.  Chest x-ray shows new right lower lobe airspace opacities plus small right pleural effusion.  ED provider consulted cardiology in the ED. 40 mg Lasix given, with no urine output.  Bedside bladder scan did not show any urine in the bladder.  Review of Systems: As per HPI all other systems reviewed and negative.  Past Medical History:  Diagnosis Date   BPH (benign prostatic hypertrophy)    Chronic systolic heart failure (HCC)    Coronary artery disease    a. Multivessel status post CABG 8/06, LIMA-LAD, SVG-CFX, SVG-distal RCA b. 12/2020: cath showing 3/3 patent grafts with 80% stenosis along LPAV prior to bifurication and medical management recommended.   Essential hypertension    Hyperlipidemia    ICD (implantable cardiac defibrillator) in place    [employer]   Ischemic cardiomyopathy     Myelodysplasia, low grade ([place]) 11/27/2015   Nephrolithiasis    Prostate cancer ([place])    Vitamin B 12 deficiency 07/27/2016    Past Surgical History:  Procedure Laterality Date   CARDIAC DEFIBRILLATOR PLACEMENT     COLONOSCOPY     COLONOSCOPY N/A 08/19/2013   Procedure: COLONOSCOPY;  Surgeon: Rogene Houston, MD;  [facility];  Service: Endoscopy;  Laterality: N/A;  23   CORONARY ARTERY BYPASS GRAFT     8/06 with left anterior descending artery, saphenous vein graft to circumflex, saphenous vein graft to distal right coronary artery   CYSTOSCOPY/URETEROSCOPY/HOLMIUM LASER/STENT PLACEMENT Right 11/07/2020   Procedure: CYSTOSCOPY/URETEROSCOPY/HOLMIUM LASER/STENT PLACEMENT;  Surgeon: Alexis Frock, MD;  Location: [facility];  Service: Urology;  Laterality: Right;   HERNIA REPAIR     LEFT HEART CATH AND CORS/GRAFTS ANGIOGRAPHY N/A 12/19/2020   Procedure: LEFT HEART CATH AND CORS/GRAFTS ANGIOGRAPHY;  Surgeon: Leonie Man, MD;  Location: [place] [facility];  Service: Cardiovascular;  Laterality: N/A;   POLYPECTOMY     PROSTATE SURGERY       reports that he quit smoking about 28 years ago. His smoking use included cigarettes. He has a 11.00 pack-year smoking history. He has never used smokeless tobacco. He reports that he does not drink alcohol and does not use drugs.  Allergies  Allergen Reactions   Codeine Nausea And Vomiting    Family History  Problem Relation Age of Onset   Colon cancer Father  Coronary artery disease Other    Prior to Admission medications   Medication Sig Start Date End Date Taking? Authorizing Provider  acetaminophen (TYLENOL) 500 MG tablet Take 1 tablet (500 mg total) by mouth every 6 (six) hours as needed for mild pain or fever. 07/14/15  Yes Carmin Muskrat, MD  aspirin 81 MG chewable tablet Chew 81 mg by mouth daily.   Yes [provider]  carvedilol (COREG) 12.5 MG tablet Take 1 tablet (12.5 mg total) by mouth 2 (two) times daily  with a meal. 03/30/21  Yes Strader, [place] M, PA-C  Cholecalciferol (VITAMIN D3) 1000 UNITS CAPS Take 1 capsule by mouth daily.   Yes [provider]  cyanocobalamin (,VITAMIN B-12,) 1000 MCG/ML injection ADMINISTER 1 ML IN THE MUSCLE 1 TIME AS DIRECTED Patient taking differently: Inject 1,000 mcg into the muscle every 30 (thirty) days. 03/31/21  Yes Pennington, Rebekah M, PA-C  diphenhydrAMINE (BENADRYL) 25 MG tablet Take 25 mg by mouth at bedtime as needed for sleep.   Yes [provider]  losartan (COZAAR) 50 MG tablet Take 1 tablet (50 mg total) by mouth daily. 01/03/21 07/02/21 Yes Strader, Fransisco Hertz, PA-C  nitroGLYCERIN (NITROSTAT) 0.4 MG SL tablet Place 1 tablet (0.4 mg total) under the tongue every 5 (five) minutes as needed. 06/08/19  Yes Imogene Burn, PA-C  simvastatin (ZOCOR) 40 MG tablet TAKE 1 TABLET BY MOUTH EVERY DAY IN THE EVENING Patient taking differently: Take 40 mg by mouth daily. 03/09/21  Yes Satira Sark, MD  spironolactone (ALDACTONE) 25 MG tablet Take 0.5 tablets (12.5 mg total) by mouth daily. 03/30/21 06/28/21 Yes Strader, Fransisco Hertz, PA-C  vitamin C (ASCORBIC ACID) 500 MG tablet Take 500 mg by mouth daily.     Yes [provider]    Physical Exam: Vitals:   04/12/21 1925 04/12/21 1941 04/12/21 1956 04/12/21 2000  BP: 122/75  (!) 143/63 (!) 119/45  Pulse: (!) 112   100  Resp: (!) 32  (!) 28 (!) 23  Temp:  98.9 F (37.2 C) 97.9 F (36.6 C)   TempSrc:   Oral   SpO2: 90%  94% 95%  Weight:   71.4 kg   Height:        Constitutional: NAD, calm, comfortable Vitals:   04/12/21 1925 04/12/21 1941 04/12/21 1956 04/12/21 2000  BP: 122/75  (!) 143/63 (!) 119/45  Pulse: (!) 112   100  Resp: (!) 32  (!) 28 (!) 23  Temp:  98.9 F (37.2 C) 97.9 F (36.6 C)   TempSrc:   Oral   SpO2: 90%  94% 95%  Weight:   71.4 kg   Height:       Eyes: PERRL, lids and conjunctivae normal ENMT: Mucous membranes are moist.   Neck: normal, supple,  no masses, no thyromegaly Respiratory: Upper airway transmitted sounds, rhonchi present.  Normal respiratory effort. No accessory muscle use.  Cardiovascular: Regular rate and rhythm, no murmurs / rubs / gallops.  1+ pitting extremity edema to mid leg. 2+ pedal pulses.  AICD pocket right upper chest. Abdomen: no tenderness, no masses palpated. No hepatosplenomegaly. Bowel sounds positive.  Musculoskeletal: no clubbing / cyanosis. No joint deformity upper and lower extremities. Good ROM, no contractures. Normal muscle tone.  Skin: no rashes, lesions, ulcers. No induration Neurologic: 4+5 strength in all extremities.  No apparent cranial nerve abnormality. Psychiatric: Normal judgment and insight. Alert and oriented x 3. Normal mood.   Labs on Admission: I have personally  reviewed following labs and imaging studies  CBC: Recent Labs  Lab 04/12/21 1453  WBC 5.9  NEUTROABS 2.5  HGB 13.8  HCT 42.8  MCV 94.7  PLT 86*   Basic Metabolic Panel: Recent Labs  Lab 04/12/21 1453  NA 137  K 3.7  CL 101  CO2 26  GLUCOSE 209*  BUN 24*  CREATININE 1.53*  CALCIUM 9.0   GFR: Estimated Creatinine Clearance: 27.9 mL/min (A) (by C-G formula based on SCr of 1.53 mg/dL (H)). Liver Function Tests: Recent Labs  Lab 04/12/21 1453  AST 42*  ALT 36  ALKPHOS 72  BILITOT 2.3*  PROT 6.4*  ALBUMIN 3.7    Radiological Exams on Admission: DG Chest Port 1 View  Result Date: 04/12/2021 CLINICAL DATA:  SOB EXAM: PORTABLE CHEST 1 VIEW COMPARISON:  August 2022 FINDINGS: Implantable cardiac device with stable position of the lead. The heart appears within normal limits for size. Probable small right pleural effusion. No left pleural effusion. No pneumothorax. Heterogeneous right lower lung airspace opacities. Osteopenia with healing/chronic right rib fractures, present on previous exam. Median sternotomy wires. IMPRESSION: There are new right lower lung heterogeneous airspace opacities with small right  pleural effusion. Electronically Signed   By: Albin Felling M.D.   On: 04/12/2021 15:17    EKG: Independently reviewed.  Sinus tachycardia rate 118, QTc 467.  RBBB, LAFB.  QRS 146.  Assessment/Plan Principal Problem:   Acute on chronic congestive heart failure (HCC) Active Problems:   Essential hypertension   Coronary artery disease   Ischemic cardiomyopathy   SYSTOLIC HEART FAILURE, CHRONIC   Automatic implantable cardioverter-defibrillator in situ   Acute respiratory failure with hypoxia (HCC)   Acute on chronic systolic congestive heart failure, AICD status-pitting lower extremity swelling, BNP markedly elevated at 1568.  Chest x-ray shows new right lower lobe heterogeneous airspace opacities with small right pleural effusion. Was dyspneic, was placed on BiPAP.  Echo 12/2020 EF 40 to 45% with indeterminate diastolic parameters. -IV Lasix 40 mg x 1 given without response to repeat 40 mg dose x1 -Further Lasix dosing pending renal function and imaging findings in the morning -Trend troponin -Lower extremity venous Dopplers -Strict input output, daily weights -Daily BMP  Acute respiratory failure-O2 sat 84% on room air here on arrival to unit.  Transiently required BiPAP currently on 4 L.  Rhonchi and upper airway transmitted sounds on auscultation, productive cough..  Quit smoking cigarettes over 20 years ago.  No COPD or asthma history.  Influenza and COVID-negative. -DuoNebs as needed and scheduled. - Chest x-ray findings do not explain his level of dyspnea and hypoxia, will obtain VQ scan in the morning, -Hold off on pharmacologic DVT prophylaxis for now with thrombocytopenia -If imaging findings negative, then possibly bronchitis -Mucolytic's  Thrombocytopenia of 86, baseline 110-133. -Repeat in a.m.  CABG, Ischemic cardiomyopathy, AICD status-severe CAD per recent cardiac cath 12/2020 by Dr. Ellyn Hack, medical management optimization recommended. -Resume spironolactone,  carvedilol, simvastatin, losartan, aspirin.  Hypertension-stable.  Blood pressure recorded at 87/54, improved without fluids. ? if spurious. -Resume carvedilol, losartan and spironolactone for now.  Chronic kidney disease stage III A-B- Cr- 1.53, baseline appears to be 1-1.3. -Monitor with diuresis.  DVT prophylaxis: SCDs for now with thrombocytopenia Code Status: Full code, confirmed with patient and spouse at bedside. Family Communication: Spouse at bedside Disposition Plan: ~ /> 2 days Consults called: Cards Admission status: Inpt stepdown I certify that at the point of admission it is my clinical judgment that the patient  will require inpatient hospital care spanning beyond 2 midnights from the point of admission due to high intensity of service, high risk for further deterioration and high frequency of surveillance required.     Bethena Roys MD [facility]  04/12/2021, 9:45 PM

## 2021-04-12 NOTE — ED Notes (Signed)
This nurse reported VS and respiratory status to EDP.

## 2021-04-12 NOTE — ED Provider Notes (Signed)
[facility] Provider Note   CSN: [id number] Arrival date & time: 04/12/21  1425     History Chief Complaint  Patient presents with   Respiratory Distress    [name] is a 87 y.o. male.  HPI Patient presents in respiratory distress from home. He is generally well, typically awake, alert, ambulatory.  The past the patient has become more fatigued, listless, was hypoxic, 80% on room air per EMS. Improved with nonrebreather mask to 95%, was oriented throughout transport. Patient denies pain, states that he feels weak, short of breath. No obvious recent medication change, diet change, activity change. Level 5 Secondary to acute of condition/respiratory distress  Past Medical History:  Diagnosis Date   BPH (benign prostatic hypertrophy)    Chronic systolic heart failure ([place])    Coronary artery disease    a. Multivessel status post CABG 8/06, LIMA-LAD, SVG-CFX, SVG-distal RCA b. 12/2020: cath showing 3/3 patent grafts with 80% stenosis along LPAV prior to bifurication and medical management recommended.   Essential hypertension    Hyperlipidemia    ICD (implantable cardiac defibrillator) in place    [employer]   Ischemic cardiomyopathy    Myelodysplasia, low grade ([place]) 11/27/2015   Nephrolithiasis    Prostate cancer ([place])    Vitamin B 12 deficiency 07/27/2016    Patient Active Problem List   Diagnosis Date Noted   Acute on chronic congestive heart failure ([place]) 04/12/2021   Unstable angina (HCC)    Family history of CABG    Chest pain 12/16/2020   Acute renal failure superimposed on stage 3 chronic kidney disease, unspecified acute renal failure type, unspecified whether stage 3a or 3b CKD ([place]) 11/07/2020   AKI (acute kidney injury) ([place]) 11/07/2020   Acute renal failure (ARF) ([place]) 11/07/2020   Gout involving toe 05/26/2019   Vitamin B 12 deficiency 07/27/2016   Myelodysplasia, low grade ([place]) 11/27/2015   Other primary cardiomyopathies  08/07/2011   Carotid stenosis 07/26/2011   Automatic implantable cardioverter-defibrillator in situ 07/26/2009   CAD, AUTOLOGOUS BYPASS GRAFT 08/24/2008   PROSTATE CANCER 08/03/2008   Other hyperlipidemia 08/03/2008   Essential hypertension 08/03/2008   MYOCARDIAL INFARCTION, HX OF 08/03/2008   Coronary artery disease 08/03/2008   CARDIOMYOPATHY, ISCHEMIC 45/80/9983   SYSTOLIC HEART FAILURE, CHRONIC 08/03/2008   NEPHROLITHIASIS, HX OF 08/03/2008   POSTSURGICAL AORTOCORONARY BYPASS STATUS 08/03/2008    Past Surgical History:  Procedure Laterality Date   CARDIAC DEFIBRILLATOR PLACEMENT     COLONOSCOPY     COLONOSCOPY N/A 08/19/2013   Procedure: COLONOSCOPY;  Surgeon: Rogene Houston, MD;  [facility];  Service: Endoscopy;  Laterality: N/A;  53   CORONARY ARTERY BYPASS GRAFT     8/06 with left anterior descending artery, saphenous vein graft to circumflex, saphenous vein graft to distal right coronary artery   CYSTOSCOPY/URETEROSCOPY/HOLMIUM LASER/STENT PLACEMENT Right 11/07/2020   Procedure: CYSTOSCOPY/URETEROSCOPY/HOLMIUM LASER/STENT PLACEMENT;  Surgeon: Alexis Frock, MD;  Location[facility];  Service: Urology;  Laterality: Right;   HERNIA REPAIR     LEFT HEART CATH AND CORS/GRAFTS ANGIOGRAPHY N/A 12/19/2020   Procedure: LEFT HEART CATH AND CORS/GRAFTS ANGIOGRAPHY;  Surgeon: Leonie Man, MD;  Location: [facility];  Service: Cardiovascular;  Laterality: N/A;   POLYPECTOMY     PROSTATE SURGERY         Family History  Problem Relation Age of Onset   Colon cancer Father    Coronary artery disease Other     Social History  Tobacco Use   Smoking status: Former    Packs/day: 0.25    Years: 44.00    Pack years: 11.00    Types: Cigarettes    Quit date: 05/14/1992    Years since quitting: 28.9   Smokeless tobacco: Never  Vaping Use   Vaping Use: Never used  Substance Use Topics   Alcohol use: No   Drug use: No    Home Medications Prior to Admission  medications   Medication Sig Start Date End Date Taking? Authorizing Provider  acetaminophen (TYLENOL) 500 MG tablet Take 1 tablet (500 mg total) by mouth every 6 (six) hours as needed for mild pain or fever. 07/14/15   Carmin Muskrat, MD  aspirin 81 MG chewable tablet Chew 81 mg by mouth daily.    [provider]  carvedilol (COREG) 12.5 MG tablet Take 1 tablet (12.5 mg total) by mouth 2 (two) times daily with a meal. 03/30/21   Strader, [place], PA-C  Cholecalciferol (VITAMIN D3) 1000 UNITS CAPS Take 1 capsule by mouth daily.    [provider]  cyanocobalamin (,VITAMIN B-12,) 1000 MCG/ML injection ADMINISTER 1 ML IN THE MUSCLE 1 TIME AS DIRECTED 03/31/21   Tarri Abernethy M, PA-C  diphenhydrAMINE (BENADRYL) 25 MG tablet Take 25 mg by mouth at bedtime as needed for sleep.    [provider]  losartan (COZAAR) 50 MG tablet Take 1 tablet (50 mg total) by mouth daily. Patient taking differently: Take 50 mg by mouth daily. Pt is taking 25 mg currently due to insurance issue 01/03/21 07/02/21  Ahmed Prima, Fransisco Hertz, PA-C  nitroGLYCERIN (NITROSTAT) 0.4 MG SL tablet Place 1 tablet (0.4 mg total) under the tongue every 5 (five) minutes as needed. 06/08/19   Imogene Burn, PA-C  simvastatin (ZOCOR) 40 MG tablet TAKE 1 TABLET BY MOUTH EVERY DAY IN THE EVENING 03/09/21   Satira Sark, MD  spironolactone (ALDACTONE) 25 MG tablet Take 0.5 tablets (12.5 mg total) by mouth daily. 03/30/21 06/28/21  Strader, Fransisco Hertz, PA-C  vitamin C (ASCORBIC ACID) 500 MG tablet Take 500 mg by mouth daily.      [provider]    Allergies    Codeine  Review of Systems   Review of Systems  Unable to perform ROS: Acuity of condition   Physical Exam Updated Vital Signs BP 102/62   Pulse 96   Temp 98.7 F (37.1 C) (Oral)   Resp (!) 25   Ht 5\' 4"  (1.626 m)   Wt 76.2 kg   SpO2 98%   BMI 28.84 kg/m   Physical Exam Vitals and nursing note reviewed.  Constitutional:       General: He is in acute distress.     Appearance: He is well-developed. He is ill-appearing and diaphoretic.  HENT:     Head: Normocephalic and atraumatic.  Eyes:     Conjunctiva/sclera: Conjunctivae normal.  Cardiovascular:     Rate and Rhythm: Regular rhythm. Tachycardia present.  Pulmonary:     Effort: Tachypnea, accessory muscle usage and respiratory distress present.     Breath sounds: Decreased air movement present. No stridor. Wheezing present.  Abdominal:     General: There is no distension.  Musculoskeletal:     Right lower leg: Edema present.     Left lower leg: Edema present.  Skin:    General: Skin is warm.  Neurological:     Mental Status: He is alert and oriented to person, place, and time.  ED Results / Procedures / Treatments   Labs (all labs ordered are listed, but only abnormal results are displayed) Labs Reviewed  COMPREHENSIVE METABOLIC PANEL - Abnormal; Notable for the following components:      Result Value   Glucose, Bld 209 (*)    BUN 24 (*)    Creatinine, Ser 1.53 (*)    Total Protein 6.4 (*)    AST 42 (*)    Total Bilirubin 2.3 (*)    GFR, Estimated 42 (*)    All other components within normal limits  CBC WITH DIFFERENTIAL/PLATELET - Abnormal; Notable for the following components:   Platelets 86 (*)    Lymphs Abs 0.4 (*)    Monocytes Absolute 2.9 (*)    All other components within normal limits  BRAIN NATRIURETIC PEPTIDE - Abnormal; Notable for the following components:   B Natriuretic Peptide 1,568.0 (*)    All other components within normal limits  RESP PANEL BY RT-PCR (FLU A&B, COVID) [id number]    EKG EKG Interpretation  Date/Time:  Wednesday April 12 2021 14:33:51 EST Ventricular Rate:  118 PR Interval:  146 QRS Duration: 146 QT Interval:  333 QTC Calculation: 467 R Axis:   262 Text Interpretation: Sinus tachycardia Atrial premature complex RBBB and LAFB Abnormal ECG Confirmed by Carmin Muskrat [phone]) on 04/12/2021  2:41:52 PM  Radiology DG Chest Port 1 View  Result Date: 04/12/2021 CLINICAL DATA:  SOB EXAM: PORTABLE CHEST 1 VIEW COMPARISON:  August 2022 FINDINGS: Implantable cardiac device with stable position of the lead. The heart appears within normal limits for size. Probable small right pleural effusion. No left pleural effusion. No pneumothorax. Heterogeneous right lower lung airspace opacities. Osteopenia with healing/chronic right rib fractures, present on previous exam. Median sternotomy wires. IMPRESSION: There are new right lower lung heterogeneous airspace opacities with small right pleural effusion. Electronically Signed   By: Albin Felling M.D.   On: 04/12/2021 15:17    Procedures Procedures   Medications Ordered in ED Medications  furosemide (LASIX) injection 40 mg (40 mg Intravenous Given 04/12/21 1547)    ED Course  I have reviewed the triage vital signs and the nursing notes.  Pertinent labs & imaging results that were available during my care of the patient were reviewed by me and considered in my medical decision making (see chart for details). Nonrebreather mask 100% abnormal  Placed on BiPAP with concern for respiratory distress, anasarca.  Update: Patient improved substantially   4:56 PM Labs reviewed with patient's wife, hand, at length.  Given his substantial improvement he is appropriate for stepdown rather than ICU admission.  Patient will continue to receive IV diuresis.  I discussed his care with our cardiology colleagues and internal medicine colleagues for admission for heart failure exacerbation admission. MDM Rules/Calculators/A&P MDM Number of Diagnoses or Management Options Acute combined systolic and diastolic congestive heart failure ([place]): established, worsening Respiratory distress: new, needed workup   Amount and/or Complexity of Data Reviewed Clinical lab tests: reviewed and ordered Tests in the radiology section of CPT: ordered and reviewed Tests  in the medicine section of CPT: reviewed and ordered Decide to obtain previous medical records or to obtain history from someone other than the patient: yes Obtain history from someone other than the patient: yes Review and summarize past medical records: yes Discuss the patient with other providers: yes Independent visualization of images, tracings, or specimens: yes  Risk of Complications, Morbidity, and/or Mortality Presenting problems: high Diagnostic procedures: high Management options: high  Critical Care Total time providing critical care: 30-74 minutes (45)  Patient Progress Patient progress: improved   Final Clinical Impression(s) / ED Diagnoses Final diagnoses:  Respiratory distress  Acute combined systolic and diastolic congestive heart failure (HCC)     Carmin Muskrat, MD 04/12/21 1657

## 2021-04-12 NOTE — ED Notes (Signed)
Pt cont on bipap.  Pt seem more alert than he was initall.  Pt ask for water.  Pt given small sips of water and bib pap placed back on pt.

## 2021-04-12 NOTE — ED Notes (Signed)
Pt taken off bipap per md and placed on 4lpm o2 via [place].

## 2021-04-13 ENCOUNTER — Inpatient Hospital Stay (HOSPITAL_COMMUNITY): Payer: Medicare Other

## 2021-04-13 DIAGNOSIS — Z95810 Presence of automatic (implantable) cardiac defibrillator: Secondary | ICD-10-CM

## 2021-04-13 DIAGNOSIS — I428 Other cardiomyopathies: Secondary | ICD-10-CM

## 2021-04-13 DIAGNOSIS — I4510 Unspecified right bundle-branch block: Secondary | ICD-10-CM | POA: Diagnosis not present

## 2021-04-13 DIAGNOSIS — J9601 Acute respiratory failure with hypoxia: Secondary | ICD-10-CM

## 2021-04-13 DIAGNOSIS — I255 Ischemic cardiomyopathy: Secondary | ICD-10-CM | POA: Diagnosis not present

## 2021-04-13 DIAGNOSIS — I10 Essential (primary) hypertension: Secondary | ICD-10-CM | POA: Diagnosis not present

## 2021-04-13 DIAGNOSIS — I444 Left anterior fascicular block: Secondary | ICD-10-CM | POA: Diagnosis not present

## 2021-04-13 DIAGNOSIS — I4891 Unspecified atrial fibrillation: Secondary | ICD-10-CM | POA: Diagnosis not present

## 2021-04-13 DIAGNOSIS — J181 Lobar pneumonia, unspecified organism: Secondary | ICD-10-CM

## 2021-04-13 DIAGNOSIS — J9602 Acute respiratory failure with hypercapnia: Secondary | ICD-10-CM

## 2021-04-13 DIAGNOSIS — A419 Sepsis, unspecified organism: Secondary | ICD-10-CM

## 2021-04-13 LAB — URINALYSIS, ROUTINE W REFLEX MICROSCOPIC
Bilirubin Urine: NEGATIVE
Glucose, UA: NEGATIVE mg/dL
Ketones, ur: NEGATIVE mg/dL
Nitrite: NEGATIVE
Protein, ur: 100 mg/dL — AB
Specific Gravity, Urine: 1.025 (ref 1.005–1.030)
pH: 5 (ref 5.0–8.0)

## 2021-04-13 LAB — CBC
HCT: 41.6 % (ref 39.0–52.0)
HCT: 40.3 % (ref 39.0–52.0)
Hemoglobin: 12.9 g/dL — ABNORMAL LOW (ref 13.0–17.0)
Hemoglobin: 12.7 g/dL — ABNORMAL LOW (ref 13.0–17.0)
MCH: 29.9 pg (ref 26.0–34.0)
MCH: 30 pg (ref 26.0–34.0)
MCHC: 31 g/dL (ref 30.0–36.0)
MCHC: 31.5 g/dL (ref 30.0–36.0)
MCV: 96.5 fL (ref 80.0–100.0)
MCV: 95.3 fL (ref 80.0–100.0)
Platelets: 86 10*3/uL — ABNORMAL LOW (ref 150–400)
Platelets: 84 10*3/uL — ABNORMAL LOW (ref 150–400)
RBC: 4.31 MIL/uL (ref 4.22–5.81)
RBC: 4.23 MIL/uL (ref 4.22–5.81)
RDW: 14 % (ref 11.5–15.5)
RDW: 14 % (ref 11.5–15.5)
WBC: 11 10*3/uL — ABNORMAL HIGH (ref 4.0–10.5)
WBC: 9.2 10*3/uL (ref 4.0–10.5)
nRBC: 0 % (ref 0.0–0.2)
nRBC: 0 % (ref 0.0–0.2)

## 2021-04-13 LAB — TROPONIN I (HIGH SENSITIVITY)
Troponin I (High Sensitivity): 189 ng/L (ref <18)
Troponin I (High Sensitivity): 221 ng/L (ref <18)

## 2021-04-13 LAB — BLOOD GAS, ARTERIAL
Acid-Base Excess: 1.5 mmol/L (ref 0.0–2.0)
Bicarbonate: 25 mmol/L (ref 20.0–28.0)
Drawn by: 21310
FIO2: 50
O2 Saturation: 94.4 %
Patient temperature: 36.8
pCO2 arterial: 49.6 mmHg — ABNORMAL HIGH (ref 32.0–48.0)
pH, Arterial: 7.347 — ABNORMAL LOW (ref 7.350–7.450)
pO2, Arterial: 84 mmHg (ref 83.0–108.0)

## 2021-04-13 LAB — BASIC METABOLIC PANEL
Anion gap: 10 (ref 5–15)
Anion gap: 9 (ref 5–15)
BUN: 29 mg/dL — ABNORMAL HIGH (ref 8–23)
BUN: 31 mg/dL — ABNORMAL HIGH (ref 8–23)
CO2: 26 mmol/L (ref 22–32)
CO2: 25 mmol/L (ref 22–32)
Calcium: 8.8 mg/dL — ABNORMAL LOW (ref 8.9–10.3)
Calcium: 8.5 mg/dL — ABNORMAL LOW (ref 8.9–10.3)
Chloride: 101 mmol/L (ref 98–111)
Chloride: 103 mmol/L (ref 98–111)
Creatinine, Ser: 2.06 mg/dL — ABNORMAL HIGH (ref 0.61–1.24)
Creatinine, Ser: 2.12 mg/dL — ABNORMAL HIGH (ref 0.61–1.24)
GFR, Estimated: 30 mL/min — ABNORMAL LOW (ref >60)
GFR, Estimated: 29 mL/min — ABNORMAL LOW (ref >60)
Glucose, Bld: 121 mg/dL — ABNORMAL HIGH (ref 70–99)
Glucose, Bld: 109 mg/dL — ABNORMAL HIGH (ref 70–99)
Potassium: 4 mmol/L (ref 3.5–5.1)
Potassium: 4 mmol/L (ref 3.5–5.1)
Sodium: 137 mmol/L (ref 135–145)
Sodium: 137 mmol/L (ref 135–145)

## 2021-04-13 LAB — PHOSPHORUS: Phosphorus: 3.4 mg/dL (ref 2.5–4.6)

## 2021-04-13 LAB — URINALYSIS, MICROSCOPIC (REFLEX)

## 2021-04-13 LAB — PROCALCITONIN: Procalcitonin: 43.33 ng/mL

## 2021-04-13 LAB — GLUCOSE, CAPILLARY
Glucose-Capillary: 51 mg/dL — ABNORMAL LOW (ref 70–99)
Glucose-Capillary: 64 mg/dL — ABNORMAL LOW (ref 70–99)

## 2021-04-13 LAB — MAGNESIUM: Magnesium: 1.4 mg/dL — ABNORMAL LOW (ref 1.7–2.4)

## 2021-04-13 MED ORDER — CHLORHEXIDINE GLUCONATE CLOTH 2 % EX PADS
6.0000 | MEDICATED_PAD | Freq: Every day | CUTANEOUS | Status: DC
  Administered 2021-04-13 – 2021-04-24 (×11): 6 via TOPICAL

## 2021-04-13 MED ORDER — PROCHLORPERAZINE EDISYLATE 10 MG/2ML IJ SOLN
5.0000 mg | Freq: Four times a day (QID) | INTRAMUSCULAR | Status: DC | PRN
  Filled 2021-04-13: qty 1

## 2021-04-13 MED ORDER — PHENYLEPHRINE HCL-NACL 20-0.9 MG/250ML-% IV SOLN
25.0000 ug/min | INTRAVENOUS | Status: DC
  Administered 2021-04-13: 25 ug/min via INTRAVENOUS
  Administered 2021-04-13: 55 ug/min via INTRAVENOUS
  Administered 2021-04-14: 45 ug/min via INTRAVENOUS
  Filled 2021-04-13 (×3): qty 250

## 2021-04-13 MED ORDER — DEXTROSE-NACL 5-0.9 % IV SOLN: Freq: Once | INTRAVENOUS | Status: AC

## 2021-04-13 MED ORDER — SODIUM CHLORIDE 0.9 % IV SOLN
2.0000 g | INTRAVENOUS | Status: DC
  Administered 2021-04-13 – 2021-04-20 (×8): 2 g via INTRAVENOUS
  Filled 2021-04-13 (×8): qty 2

## 2021-04-13 MED ORDER — MORPHINE SULFATE (PF) 2 MG/ML IV SOLN
1.0000 mg | Freq: Once | INTRAVENOUS | Status: AC
  Administered 2021-04-13: 1 mg via INTRAVENOUS
  Filled 2021-04-13: qty 1

## 2021-04-13 MED ORDER — HYDROCORTISONE SOD SUC (PF) 100 MG IJ SOLR
75.0000 mg | Freq: Two times a day (BID) | INTRAMUSCULAR | Status: DC
  Administered 2021-04-13 – 2021-04-15 (×4): 75 mg via INTRAVENOUS
  Filled 2021-04-13 (×4): qty 2

## 2021-04-13 MED ORDER — TECHNETIUM TO 99M ALBUMIN AGGREGATED
4.0000 | Freq: Once | INTRAVENOUS | Status: AC | PRN
  Administered 2021-04-13: 4.2 via INTRAVENOUS

## 2021-04-13 MED ORDER — MAGNESIUM SULFATE 2 GM/50ML IV SOLN
2.0000 g | Freq: Once | INTRAVENOUS | Status: AC
  Administered 2021-04-13: 2 g via INTRAVENOUS
  Filled 2021-04-13: qty 50

## 2021-04-13 MED ORDER — LACTATED RINGERS IV BOLUS
500.0000 mL | Freq: Once | INTRAVENOUS | Status: AC
  Administered 2021-04-13: 500 mL via INTRAVENOUS

## 2021-04-13 MED ORDER — SODIUM CHLORIDE 0.9 % IV SOLN
250.0000 mL | INTRAVENOUS | Status: DC
  Administered 2021-04-13: 250 mL via INTRAVENOUS

## 2021-04-13 MED ORDER — LACTATED RINGERS IV BOLUS
1000.0000 mL | Freq: Once | INTRAVENOUS | Status: AC
  Administered 2021-04-13: 1000 mL via INTRAVENOUS

## 2021-04-13 MED ORDER — AZITHROMYCIN 500 MG IV SOLR
500.0000 mg | INTRAVENOUS | Status: DC
  Administered 2021-04-13 – 2021-04-15 (×3): 500 mg via INTRAVENOUS
  Filled 2021-04-13 (×3): qty 500

## 2021-04-13 NOTE — Plan of Care (Addendum)
  Problem: Acute Rehab PT Goals(only PT should resolve) Goal: Pt Will Go Supine/Side To Sit Outcome: Progressing Flowsheets (Taken 04/13/2021 1534) Pt will go Supine/Side to Sit:  with min guard assist  with minimal assist Goal: Patient Will Transfer Sit To/From Stand Outcome: Progressing Flowsheets (Taken 04/13/2021 1534) Patient will transfer sit to/from stand: with min guard assist Goal: Pt Will Transfer Bed To Chair/Chair To Bed Outcome: Progressing Flowsheets (Taken 04/13/2021 1534) Pt will Transfer Bed to Chair/Chair to Bed: min guard assist Goal: Pt Will Ambulate Outcome: Progressing Flowsheets (Taken 04/13/2021 1534) Pt will Ambulate:  15 feet  with rolling walker  with min guard assist  with supervision     Cassie Jones, SPT  During this treatment session, the therapist was present, participating in and directing the treatment.  3:48 PM, 04/13/21 Lonell Grandchild, MPT Physical Therapist with [facility] [phone] office [phone] mobile phone

## 2021-04-13 NOTE — Consult Note (Addendum)
Cardiology Consultation:   Patient ID: [name] MRN: [id number]; DOB: 12-05-1926  Admit date: 04/12/2021 Date of Consult: 04/13/2021  PCP:  Luke Squibb, MD   Newborn Providers Cardiologist:  Luke Lesches, MD        Patient Profile:   [name] is a 87 y.o. male with a hx of CAD (s/p CABG in 2006 with LIMA-LAD, SVG-LCx and SVG-dRCA, repeat cath in 12/2020 showing 3/3 patent grafts and likely culprit was 80% LPAV and medical management recommended), HFrEF/ICM (EF at 45-50% in 2016, at 40-45% by repeat echo in 12/2020), Medtronic ICD at [facility] (previously followed by Dr. Lovena Le and at [facility] in 2016 and not replaced due to improvement in his EF), history of prostate cancer, nephrolithiasis, MDS, HTN and HLD who is being seen 04/13/2021 for the evaluation of CHF at the request of [name].  History of Present Illness:   [name] was recently examined by myself on 03/30/2021 and reported having weakness following his hospitalization in 12/2020 but this was starting to improve.  He denied any recent anginal symptoms at that time. His weight was at 168 lbs and he did have some lower extremity edema on examination, therefore he was restarted on Spironolactone 12.5 mg daily given improvement in his renal function.  Was also continued on ASA, Coreg, Simvastatin and Losartan.  He presented to [facility] ED on 04/12/2021 for acutely worsening shortness of breath which had started earlier that morning. Upon EMS arrival, his oxygen saturations were in the 60's on room air and improved into the 90's on NRB. He did require BiPAP while in the ED. Was febrile to 102.8 on admission.   Initial labs showed WBC 5.9, Hgb 13.8 and platelets 86K, Na+ 137, K+ 3.7 and creatinine 1.53 (baseline 1.1 - 1.2). BNP 1568. Negative for COVID and Influenza. Initial Hs Troponin 162 with repeat values of 184, 189 and 221. Blood cultures and urine culture pending. Procalcitonin elevated to 43. CXR showed new  right lower lung airspaces opacities with small right pleural effusion. EKG on admission showing a narrow complex tachycardia, heart rate 118 with known RBBB and LAFB. EKG this morning most consistent with atrial fibrillation and telemetry shows atrial fibrillation, HR in 90's to low-100's.    Received 2 doses of IV Lasix 40 mg while in the ED and minimal output recorded.  His weight is recorded at 159 lbs this morning. He did have a repeat CXR this AM which showed increasing opacities in the left infrahilar retrocardiac lower lobe and right lung base concerning for pneumonia. He has been started on Cefepime and Azithromycin by the admitting team. Neo-synephrine was ordered for BP support but has not yet been started.   Information mostly obtained by chart review. Patient currently on BiPAP but responding to questions. Still with significant shortness of breath and frequent coughing. No reported chest pain.   Past Medical History:  Diagnosis Date   BPH (benign prostatic hypertrophy)    Chronic systolic heart failure (HCC)    Coronary artery disease    a. Multivessel status post CABG 8/06, LIMA-LAD, SVG-CFX, SVG-distal RCA b. 12/2020: cath showing 3/3 patent grafts with 80% stenosis along LPAV prior to bifurication and medical management recommended.   Essential hypertension    Hyperlipidemia    ICD (implantable cardiac defibrillator) in place    Medtronic   Ischemic cardiomyopathy    Myelodysplasia, low grade ([place]) 11/27/2015   Nephrolithiasis    Prostate cancer ([place])  Vitamin B 12 deficiency 07/27/2016    Past Surgical History:  Procedure Laterality Date   CARDIAC DEFIBRILLATOR PLACEMENT     COLONOSCOPY     COLONOSCOPY N/A 08/19/2013   Procedure: COLONOSCOPY;  Surgeon: Rogene Houston, MD;  Location: [facility];  Service: Endoscopy;  Laterality: N/A;  82   CORONARY ARTERY BYPASS GRAFT     8/06 with left anterior descending artery, saphenous vein graft to circumflex, saphenous vein  graft to distal right coronary artery   CYSTOSCOPY/URETEROSCOPY/HOLMIUM LASER/STENT PLACEMENT Right 11/07/2020   Procedure: CYSTOSCOPY/URETEROSCOPY/HOLMIUM LASER/STENT PLACEMENT;  Surgeon: Alexis Frock, MD;  Location: [facility];  Service: Urology;  Laterality: Right;   HERNIA REPAIR     LEFT HEART CATH AND CORS/GRAFTS ANGIOGRAPHY N/A 12/19/2020   Procedure: LEFT HEART CATH AND CORS/GRAFTS ANGIOGRAPHY;  Surgeon: Leonie Man, MD;  Location: [place] [facility];  Service: Cardiovascular;  Laterality: N/A;   POLYPECTOMY     PROSTATE SURGERY       Home Medications:  Prior to Admission medications   Medication Sig Start Date End Date Taking? Authorizing Provider  acetaminophen (TYLENOL) 500 MG tablet Take 1 tablet (500 mg total) by mouth every 6 (six) hours as needed for mild pain or fever. 07/14/15  Yes Carmin Muskrat, MD  aspirin 81 MG chewable tablet Chew 81 mg by mouth daily.   Yes [provider]  carvedilol (COREG) 12.5 MG tablet Take 1 tablet (12.5 mg total) by mouth 2 (two) times daily with a meal. 03/30/21  Yes Strader, [place] M, PA-C  Cholecalciferol (VITAMIN D3) 1000 UNITS CAPS Take 1 capsule by mouth daily.   Yes [provider]  cyanocobalamin (,VITAMIN B-12,) 1000 MCG/ML injection ADMINISTER 1 ML IN THE MUSCLE 1 TIME AS DIRECTED Patient taking differently: Inject 1,000 mcg into the muscle every 30 (thirty) days. 03/31/21  Yes Pennington, Rebekah M, PA-C  diphenhydrAMINE (BENADRYL) 25 MG tablet Take 25 mg by mouth at bedtime as needed for sleep.   Yes [provider]  losartan (COZAAR) 50 MG tablet Take 1 tablet (50 mg total) by mouth daily. 01/03/21 07/02/21 Yes Strader, Fransisco Hertz, PA-C  nitroGLYCERIN (NITROSTAT) 0.4 MG SL tablet Place 1 tablet (0.4 mg total) under the tongue every 5 (five) minutes as needed. 06/08/19  Yes Imogene Burn, PA-C  simvastatin (ZOCOR) 40 MG tablet TAKE 1 TABLET BY MOUTH EVERY DAY IN THE EVENING Patient taking differently: Take  40 mg by mouth daily. 03/09/21  Yes Satira Sark, MD  spironolactone (ALDACTONE) 25 MG tablet Take 0.5 tablets (12.5 mg total) by mouth daily. 03/30/21 06/28/21 Yes Strader, Fransisco Hertz, PA-C  vitamin C (ASCORBIC ACID) 500 MG tablet Take 500 mg by mouth daily.     Yes [provider]    Inpatient Medications: Scheduled Meds:  Chlorhexidine Gluconate Cloth  6 each Topical Daily   feeding supplement  237 mL Oral BID BM   guaiFENesin-dextromethorphan  10 mL Oral Q8H   mouth rinse  15 mL Mouth Rinse BID   Continuous Infusions:  sodium chloride 250 mL (04/13/21 0915)   azithromycin 500 mg (04/13/21 0915)   ceFEPime (MAXIPIME) IV 2 g (04/13/21 0752)   phenylephrine (NEO-SYNEPHRINE) Adult infusion     PRN Meds: acetaminophen **OR** acetaminophen, ipratropium-albuterol, prochlorperazine  Allergies:    Allergies  Allergen Reactions   Codeine Nausea And Vomiting    Social History:   Social History   Tobacco Use   Smoking status: Former    Packs/day: 0.25  Years: 44.00    Pack years: 11.00    Types: Cigarettes    Quit date: 05/14/1992    Years since quitting: 28.9   Smokeless tobacco: Never  Substance Use Topics   Alcohol use: No     Family History:    Family History  Problem Relation Age of Onset   Colon cancer Father    Coronary artery disease Other      ROS:  Please see the history of present illness.   All other ROS reviewed and negative.     Physical Exam/Data:   Vitals:   04/13/21 0637 04/13/21 0700 04/13/21 0705 04/13/21 0731  BP:  (!) 91/26 (!) 79/57 (!) 108/52  Pulse:  60 100 91  Resp:  14 (!) 21 (!) 24  Temp: (!) 102.1 F (38.9 C)     TempSrc: Axillary     SpO2:  95% 95% 93%  Weight:      Height:        Intake/Output Summary (Last 24 hours) at 04/13/2021 0925 Last data filed at 04/13/2021 0530 Gross per 24 hour  Intake 169.96 ml  Output 100 ml  Net 69.96 ml   Last 3 Weights 04/13/2021 04/12/2021 04/12/2021  Weight (lbs) 159 lb  2.8 oz 157 lb 6.5 oz 168 lb  Weight (kg) 72.2 kg 71.4 kg 76.204 kg     Body mass index is 27.32 kg/m.  General: Elderly male currently on BiPAP.  HEENT: normal Neck: no JVD Vascular: No carotid bruits; Distal pulses 2+ bilaterally Cardiac:  normal S1, S2; Irregularly irregular.  Lungs: rhonchi and wheezing throughout lung fields.  Abd: soft, nontender, no hepatomegaly  Ext: no pitting edema Musculoskeletal:  No deformities, BUE and BLE [name] normal and equal Skin: warm and dry  Neuro:  CNs 2-12 intact, no focal abnormalities noted Psych:  Normal affect   Telemetry:  Telemetry was personally reviewed and demonstrates: Atrial fibrillation, HR in 90's to low-100's with occasional PVC's.   Relevant CV Studies:  Echocardiogram: 12/2020 IMPRESSIONS    1. Poor acoustic windows.   2. Poor acoustic windows limit study Endocardium is not seen in all  segments even with DEFINITY There appears to be hypokinesis of the basal  inferior, basal inferoseptal, the inferolaterall and apical walls OVerall  LVEF appears mildly depressed LVEF 40  to 45%, again EF difficult given poor image quality. . The left  ventricular internal cavity size was severely dilated. There is mild left  ventricular hypertrophy. Left ventricular diastolic parameters are  indeterminate.   3. Device lead seen in RV to distal apex. Right ventricular systolic  function is normal. The right ventricular size is normal. There is  moderately elevated pulmonary artery systolic pressure.   4. Left atrial size was severely dilated.   5. The mitral valve is normal in structure. Mild mitral valve  regurgitation.   6. The aortic valve is tricuspid. Aortic valve regurgitation is not  visualized. Mild to moderate aortic valve sclerosis/calcification is  present, without any evidence of aortic stenosis.   7. The inferior vena cava is dilated in size with <50% respiratory  variability, suggesting right atrial pressure of 15 mmHg.    Cardiac Catheterization: 12/2020   Prox RCA to Dist RCA lesion is 100% stenosed with 100% stenosed side branch in RV Branch.   Mid LM to Prox LAD lesion is 60% stenosed.   Prox Cx to Mid Cx lesion is 100% stenosed.   Prox LAD to Mid LAD lesion is 80% stenosed  with 95% stenosed side branch in 2nd Diag.   Mid LAD lesion is 100% stenosed with 75% stenosed side branch in 3rd Diag.   ** LPAV lesion is 80% stenosed. -Fills via retrograde flow from SVG-OM 2.  Not approachable percutaneously.   LIMA-LAD and is normal in caliber.  The graft exhibits no disease.   SVG-2nd Mrg graft was visualized by angiography and is large.  The graft exhibits no disease.   SVG-dRCA and is normal in caliber.  The graft exhibits no disease.   ----------------------   LV end diastolic pressure is moderately elevated.   There is no aortic valve stenosis.   SUMMARY Severe native CAD: 50-60% LEFT MAIN,  100% flush ostial RCA occlusion, 100 % proximal LCx occlusion after small OM1/RI,  Diffuse 80% proximal-mid followed by 100% mid LAD occlusion 3 of 3 patent grafts: LIMA-LAD, SVG-OM 2, SVG-RCA. Likely culprit lesion is 80% stenosis in the distal AVG LCx prior to bifurcation into LPL 1 and LPL 2 -> this fills via retrograde flow from the SVG-OM 2.  Not approachable from percutaneous option. Moderately elevated LAP.   RECOMMENDATIONS Optimize medical management.  Laboratory Data:  High Sensitivity Troponin:   Recent Labs  Lab 04/12/21 2143 04/12/21 2316 04/13/21 0142 04/13/21 0418  TROPONINIHS 162* 184* 189* 221*     Chemistry Recent Labs  Lab 04/12/21 1453 04/12/21 2316 04/13/21 0142  NA 137 137 137  K 3.7 4.0 4.0  CL 101 101 103  CO2 26 26 25   GLUCOSE 209* 121* 109*  BUN 24* 29* 31*  CREATININE 1.53* 2.06* 2.12*  CALCIUM 9.0 8.8* 8.5*  MG  --   --  1.4*  GFRNONAA 42* 30* 29*  ANIONGAP 10 10 9     Recent Labs  Lab 04/12/21 1453  PROT 6.4*  ALBUMIN 3.7  AST 42*  ALT 36  ALKPHOS 72   BILITOT 2.3*   Lipids No results for input(s): CHOL, TRIG, HDL, LABVLDL, LDLCALC, CHOLHDL in the last 168 hours.  Hematology Recent Labs  Lab 04/12/21 1453 04/12/21 2316 04/13/21 0142  WBC 5.9 11.0* 9.2  RBC 4.52 4.31 4.23  HGB 13.8 12.9* 12.7*  HCT 42.8 41.6 40.3  MCV 94.7 96.5 95.3  MCH 30.5 29.9 30.0  MCHC 32.2 31.0 31.5  RDW 13.7 14.0 14.0  PLT 86* 86* 84*    Recent Labs  Lab 04/12/21 1453  BNP 1,568.0*     Radiology/Studies:  DG CHEST PORT 1 VIEW  Result Date: 04/13/2021 CLINICAL DATA:  Difficulty breathing. EXAM: PORTABLE CHEST 1 VIEW COMPARISON:  Portable chest yesterday at 3:09 p.m. FINDINGS: 4:47 a.m., 04/13/2021. There is mild-to-moderate cardiomegaly. Mild central vascular fullness continues to be noted without appreciable edema. Sternotomy and CABG changes are redemonstrated as well as a single lead left chest cardiac assist device and wire insertion. There is increased streaky atelectasis or infiltrate in the retrocardiac left lower lobe and increased patchy opacities in the right lung base concerning for pneumonia with a small underlying right pleural effusion. Remaining lung fields remain clear. No other interval changes. Osteopenia. IMPRESSION: Increasing opacity in the left infrahilar retrocardiac lower lobe and right lung base concerning for pneumonia with worsening. Stable small right pleural effusion. Cardiomegaly. Electronically Signed   By: Telford Nab M.D.   On: 04/13/2021 05:49   DG Chest Port 1 View  Result Date: 04/12/2021 CLINICAL DATA:  SOB EXAM: PORTABLE CHEST 1 VIEW COMPARISON:  August 2022 FINDINGS: Implantable cardiac device with stable position of the lead. The  heart appears within normal limits for size. Probable small right pleural effusion. No left pleural effusion. No pneumothorax. Heterogeneous right lower lung airspace opacities. Osteopenia with healing/chronic right rib fractures, present on previous exam. Median sternotomy wires.  IMPRESSION: There are new right lower lung heterogeneous airspace opacities with small right pleural effusion. Electronically Signed   By: Albin Felling M.D.   On: 04/12/2021 15:17     Assessment and Plan:   1. Chronic HFrEF - He has a known cardiomyopathy with EF at 45-50% in 2016 and similar at 40-45% by repeat echo in 12/2020. While his BNP was elevated to 1568 on admission, his presentation is most consistent with PNA. His weight is almost 10 lbs below his prior baseline (was at 168 lbs on 03/30/2021 and at 159 lbs today). He has received IVF this AM with improvement in his BP. Would hold on additional diuresis at this time.  - He was on Coreg 12.5mg  BID, Losartan 50mg  daily and Spironolactone 12.5mg  daily prior to admission which are currently held given his soft BP. Will plan to gradually add back GDMT as BP allows this admission.   2. CAD/Elevated Troponin Values - He is s/p CABG in 2006 with LIMA-LAD, SVG-LCx and SVG-dRCA with repeat cath in 12/2020 showing 3/3 patent grafts and likely culprit was 80% LPAV and medical management recommended. - His Hs Troponin values have been elevated at 184, 189 and 221. Most consistent with demand ischemia as compared to ACS. No plans for ischemic testing this admission given his acute illness and recent cardiac work-up in 12/2020. Would resume PTA ASA and statin once able to take PO medications. Coreg held given soft BP.   3. New-onset Atrial Fibrillation - This is a new diagnosis for the patient this admission and likely triggered by his acute illness. Thankfully, his HR is stable in the 90's to low-100's. Would avoid BB for now given his intermittent hypotension. If rates become persistently elevated, would need to consider a short-course of Amiodarone.  - His CHA2DS2-VASc Score is 5 as outlined below. Not currently on anticoagulation and would not plan to initiate unless he remains in atrial fibrillation for a long duration of time given his advanced  age.   4. Sepsis in the setting of PNA - Febrile to 102.8 upon arrival with WBC up to 11.0. Procalcitonin 43 and CXR this AM showed increasing opacities in the left infrahilar retrocardiac lower lobe and right lung base concerning for pneumonia.  - Currently on Cefepime and Azithromycin. Further management by the admitting team.    Risk Assessment/Risk Scores:    CHA2DS2-VASc Score = 5   This indicates a 7.2% annual risk of stroke. The patient's score is based upon: CHF History: 1 HTN History: 1 Diabetes History: 0 Stroke History: 0 Vascular Disease History: 1 Age Score: 2 Gender Score: 0    For questions or updates, please contact [facility] Please consult [email or web address] for contact info under    Signed, Erma Heritage, PA-C  04/13/2021 9:25 AM   Attending note:  Patient seen and examined.  I agree with above assessment by Ms. Strader PA-C.  Mr. Queen Slough presents with recent worsening shortness of breath that began on November 30, he was found to be markedly hypoxic on evaluation by EMS, initially placed on BiPAP, also febrile at 102.8 on admission.  Cardiology was consulted due to concern for congestive heart failure, BNP 1568 and chest x-ray showing right lower lobe airspace opacities and a small right pleural effusion.  He also however was found to have a procalcitonin of 43 and subsequent chest x-ray showing worsening opacities more consistent with pneumonia and hypoxic respiratory failure.  He has been hypotensive, regular cardiac medications held.  Currently on broad-spectrum antibiotics including cefepime and azithromycin with blood cultures sent and pending.  He remains on BiPAP this morning, has had intermittent confusion.  On examination he is awake and responds to questions on BiPAP.  Denies chest pain.  Temperature 102.1 degrees.  Heart rate around 100 and atrial fibrillation by telemetry which I personally reviewed.  Systolic blood pressure 56E to low 100s.   Lungs exhibit scattered rhonchi and crackles, no wheezing.  Cardiac exam with irregularly irregular rhythm and 2/6 stock murmur.  2+ peripheral edema noted.  Pertinent lab work includes potassium 4.0, BUN 31, creatinine 2.12, high-sensitivity troponin I levels of 100-200 range, WBC 9.2, hemoglobin 12.7, platelets 84, influenza A and COVID-19 negative.  Chest x-ray this morning shows worsening left infrahilar retrocardiac lower lobe and right lung basilar opacities concerning for pneumonia, also small right pleural effusion.  I personally reviewed his ECG which shows atrial fibrillation with right bundle branch block and left anterior fascicular block.  Mr. Queen Slough presents with acute hypoxic respiratory failure in the setting of pneumonia and sepsis, febrile at this point and with procalcitonin of 43 and infiltrates by chest x-ray.  While his BNP is elevated, this does not look to be predominantly an acute on chronic heart failure exacerbation.  He does have known HFrEF, and would agree with temporarily holding his GDMT in light of present hemodynamics.  He also has newly documented atrial fibrillation in the setting of acute illness, although heart rate is reasonable at this point.  CHA2DS2-VASc score is 5, but not a candidate for anticoagulation at this time.  Continue supportive measures and antibiotic therapy.  His weight is down nearly 10 pounds from prior assessment, would hold on further diuretics.  Prognosis is guarded.  Satira Sark, M.D., F.A.C.C.

## 2021-04-13 NOTE — Progress Notes (Signed)
Luke NOTE  MILBURN FREENEY ZOX:[id number] DOB: [birth year] DOA: 04/12/2021 PCP: [name], MD  Brief History:  87 year old male with a history of coronary artery disease status post CABG, systolic CHF with EF 81-19%, AICD, prostate cancer, MDS, hypertension, hyperlipidemia presenting with shortness of breath of 1 week duration.  History is supplemented by the patient's wife.  Apparently his shortness of breath continue to worsen over the past 2 to 3 days prior to admission.  He had a cough with cream-colored sputum.  There is no hemoptysis, fevers, chills, chest pain, nausea, vomiting, diarrhea, abdominal pain.  The patient has bilateral lower extremity edema which appears to be chronic.  Spouse states that his edema is actually a little bit better than usual.  Upon EMS activation.  The patient was noted to have oxygen saturation of 60% on room air.  He was placed on nonrebreather.  In the emergency department, patient was initially afebrile and hemodynamically stable.  He was placed on 6 L with oxygen saturation in the low 90s.  Chest x-ray showed bibasilar opacities, regular in the left.  BMP showed sodium sodium 137, potassium 3.7, bicarbonate 26, serum creatinine 1.53.  WBC 5.9, hemoglobin 13.8, platelets 86,000.  Notably, the patient had a hospital admission from 12/17/2020 to 12/20/2020 for chest pain concerning for unstable angina.  He was started on IV heparin.  He underwent cardiac catheterization.  . Repeat catheterization showed 3/3 patent grafts and the likely culprit lesion was 80% stenosis in the distal LPAV prior to bifurcation into LPL 1 and LPL 2 which was filled from retrograde flow from the SVG to OM 2 and was not approachable for PCI and medical management was recommended. Coreg was increased to 12.5 mg twice daily and Losartan increased to 50 mg daily. He was continued on ASA 81 mg daily and Crestor 20 mg daily with consideration of adding Spironolactone or an SGLT2 inhibitor  as an outpatient if renal function remained stable (was at 1.05 on the day of discharge).  He was admitted to the ICU.  He continued to have hypoxia and was placed on BiPAP.  He subsequently developed hypotension requiring fluid resuscitation.  UA was negative for pyuria.  Assessment/Plan: Sepsis -Present on admission -Presented with tachypnea, fever, and respiratory failure -Secondary to pneumonia -PCT 43.33 -Follow blood cultures -UA negative for pyuria  Lobar pneumonia -Personally reviewed chest x-ray--right greater than left basilar opacity -Start cefepime and azithromycin  Acute respiratory failure with hypoxia and hypercarbia -Secondary to pneumonia -Currently on BiPAP FiO2 0.5 -Wean back to room air as tolerated for saturation greater 14%  Chronic systolic CHF/ischemic cardiomyopathy -12/17/2020 echo EF 40 to 45%, +WMA -Holding carvedilol, losartan, spironolactone secondary to hypotension -Holding furosemide temporarily  Essential hypertension -Holding losartan, carvedilol, spironolactone secondary to hypotension  Coronary artery disease -No chest pain presently -Continue aspirin -Holding carvedilol, spironolactone, losartan secondary to hypotension  Acute on chronic renal failure--CKD stage IIIa -Baseline creatinine 1.1-1.3 -In part due to hypotension and furosemide  Hyperlipidemia -Continue statin  Tobacco abuse in remission -50 pk yr -quit 20 years ago  Leg edema -venous duplex    Status is: Inpatient  Remains inpatient appropriate because: hemodynamic instability, severity of illness requiring BiPAP and IV abx        Family Communication:   spouse updated at bedside12/1  Consultants:  cardiology  Code Status:  FULL   DVT Prophylaxis:  [place] Heparin    Procedures: As Listed  in Luke Note Above  Antibiotics: Cefepime 12/1>> Azithro 12/1>>   The patient is critically ill with multiple organ systems failure and requires high complexity  decision making for assessment and support, frequent evaluation and titration of therapies, application of advanced monitoring technologies and extensive interpretation of multiple databases.  Critical care time - 35 mins.     Subjective:  Patient complains of some shortness of breath but it is better than yesterday.  He denies any chest pain, nausea, vomiting, diarrhea, Donnell pain, headache, neck pain.  He has a nonproductive cough.  There is no hemoptysis. Objective: Vitals:   04/13/21 0600 04/13/21 0637 04/13/21 0700 04/13/21 0705  BP: (!) 97/58  (!) 91/26 (!) 79/57  Pulse: (!) 52  60 100  Resp: (!) 29  14 (!) 21  Temp:  (!) 102.1 F (38.9 C)    TempSrc:  Axillary    SpO2: 94%  95% 95%  Weight:      Height:        Intake/Output Summary (Last 24 hours) at 04/13/2021 0709 Last data filed at 04/13/2021 0530 Gross per 24 hour  Intake 169.96 ml  Output 100 ml  Net 69.96 ml   Weight change:  Exam:  General:  Pt is alert, follows commands appropriately, not in acute distress HEENT: No icterus, No thrush, No neck mass, [place]/AT Cardiovascular: RRR, S1/S2, no rubs, no gallops Respiratory: Bilateral scattered rhonchi.  No wheezing. Abdomen: Soft/+BS, non tender, non distended, no guarding Extremities: 1 + LE edema, No lymphangitis, No petechiae, No rashes, no synovitis   Data Reviewed: I have personally reviewed following labs and imaging studies Basic Metabolic Panel: Recent Labs  Lab 04/12/21 1453 04/12/21 2316 04/13/21 0142  NA 137 137 137  K 3.7 4.0 4.0  CL 101 101 103  CO2 26 26 25   GLUCOSE 209* 121* 109*  BUN 24* 29* 31*  CREATININE 1.53* 2.06* 2.12*  CALCIUM 9.0 8.8* 8.5*  MG  --   --  1.4*  PHOS  --   --  3.4   Liver Function Tests: Recent Labs  Lab 04/12/21 1453  AST 42*  ALT 36  ALKPHOS 72  BILITOT 2.3*  PROT 6.4*  ALBUMIN 3.7   No results for input(s): LIPASE, AMYLASE in the last 168 hours. No results for input(s): AMMONIA in the last 168  hours. Coagulation Profile: No results for input(s): INR, PROTIME in the last 168 hours. CBC: Recent Labs  Lab 04/12/21 1453 04/12/21 2316 04/13/21 0142  WBC 5.9 11.0* 9.2  NEUTROABS 2.5  --   --   HGB 13.8 12.9* 12.7*  HCT 42.8 41.6 40.3  MCV 94.7 96.5 95.3  PLT 86* 86* 84*   Cardiac Enzymes: No results for input(s): CKTOTAL, CKMB, CKMBINDEX, TROPONINI in the last 168 hours. BNP: Invalid input(s): POCBNP CBG: No results for input(s): GLUCAP in the last 168 hours. HbA1C: No results for input(s): HGBA1C in the last 72 hours. Urine analysis:    Component Value Date/Time   COLORURINE YELLOW 04/13/2021 0348   APPEARANCEUR CLEAR 04/13/2021 0348   LABSPEC 1.025 04/13/2021 0348   PHURINE 5.0 04/13/2021 0348   GLUCOSEU NEGATIVE 04/13/2021 0348   HGBUR SMALL (A) 04/13/2021 0348   BILIRUBINUR NEGATIVE 04/13/2021 0348   KETONESUR NEGATIVE 04/13/2021 0348   PROTEINUR 100 (A) 04/13/2021 0348   NITRITE NEGATIVE 04/13/2021 0348   LEUKOCYTESUR SMALL (A) 04/13/2021 0348   Sepsis Labs: @LABRCNTIP (procalcitonin:4,lacticidven:4) ) Recent Results (from the past 240 hour(s))  Resp Panel by RT-PCR (  Flu A&B, Covid) Nasopharyngeal Swab     Status: [name]   Collection Time: 04/12/21  2:59 PM   Specimen: Nasopharyngeal Swab; Nasopharyngeal(NP) swabs in vial transport medium  Result Value Ref Range Status   SARS Coronavirus 2 by RT PCR NEGATIVE NEGATIVE Final    Comment: (NOTE) SARS-CoV-2 target nucleic acids are NOT DETECTED.  The SARS-CoV-2 RNA is generally detectable in upper respiratory specimens during the acute phase of infection. The lowest concentration of SARS-CoV-2 viral copies this assay can detect is 138 copies/mL. A negative result does not preclude SARS-Cov-2 infection and should not be used as the sole basis for treatment or other patient management decisions. A negative result may occur with  improper specimen collection/handling, submission of specimen other than  nasopharyngeal swab, presence of viral mutation(s) within the areas targeted by this assay, and inadequate number of viral copies(<138 copies/mL). A negative result must be combined with clinical observations, patient history, and epidemiological information. The expected result is Negative.  Fact Sheet for Patients:  EntrepreneurPulse.com.au  Fact Sheet for Healthcare Providers:  IncredibleEmployment.be  This test is no t yet approved or cleared by the [place] FDA and  has been authorized for detection and/or diagnosis of SARS-CoV-2 by FDA under an Emergency Use Authorization (EUA). This EUA will remain  in effect (meaning this test can be used) for the duration of the COVID-19 declaration under Section 564(b)(1) of the Act, 21 U.S.C.section 360bbb-3(b)(1), unless the authorization is terminated  or revoked sooner.       Influenza A by PCR NEGATIVE NEGATIVE Final   Influenza B by PCR NEGATIVE NEGATIVE Final    Comment: (NOTE) The Xpert Xpress SARS-CoV-2/FLU/RSV plus assay is intended as an aid in the diagnosis of influenza from Nasopharyngeal swab specimens and should not be used as a sole basis for treatment. Nasal washings and aspirates are unacceptable for Xpert Xpress SARS-CoV-2/FLU/RSV testing.  Fact Sheet for Patients: EntrepreneurPulse.com.au  Fact Sheet for Healthcare Providers: IncredibleEmployment.be  This test is not yet approved or cleared by the [place] FDA and has been authorized for detection and/or diagnosis of SARS-CoV-2 by FDA under an Emergency Use Authorization (EUA). This EUA will remain in effect (meaning this test can be used) for the duration of the COVID-19 declaration under Section 564(b)(1) of the Act, 21 U.S.C. section 360bbb-3(b)(1), unless the authorization is terminated or revoked.  Performed at [facility], [address], [place], [place] [address]   MRSA  Next Gen by PCR, Nasal     Status: [name]   Collection Time: 04/12/21  7:50 PM   Specimen: Nasal Mucosa; Nasal Swab  Result Value Ref Range Status   MRSA by PCR Next Gen NOT DETECTED NOT DETECTED Final    Comment: (NOTE) The GeneXpert MRSA Assay (FDA approved for NASAL specimens only), is one component of a comprehensive MRSA colonization surveillance program. It is not intended to diagnose MRSA infection nor to guide or monitor treatment for MRSA infections. Test performance is not FDA approved in patients less than 92 years old. Performed at [facility], [address], [place],  [address]      Scheduled Meds:  carvedilol  12.5 mg Oral BID WC   Chlorhexidine Gluconate Cloth  6 each Topical Daily   feeding supplement  237 mL Oral BID BM   guaiFENesin-dextromethorphan  10 mL Oral Q8H   losartan  50 mg Oral Daily   mouth rinse  15 mL Mouth Rinse BID   spironolactone  12.5 mg Oral  Daily   Continuous Infusions:  azithromycin     ceFEPime (MAXIPIME) IV     lactated ringers      Procedures/Studies: DG CHEST PORT 1 VIEW  Result Date: 04/13/2021 CLINICAL DATA:  Difficulty breathing. EXAM: PORTABLE CHEST 1 VIEW COMPARISON:  Portable chest yesterday at 3:09 p.m. FINDINGS: 4:47 a.m., 04/13/2021. There is mild-to-moderate cardiomegaly. Mild central vascular fullness continues to be noted without appreciable edema. Sternotomy and CABG changes are redemonstrated as well as a single lead left chest cardiac assist device and wire insertion. There is increased streaky atelectasis or infiltrate in the retrocardiac left lower lobe and increased patchy opacities in the right lung base concerning for pneumonia with a small underlying right pleural effusion. Remaining lung fields remain clear. No other interval changes. Osteopenia. IMPRESSION: Increasing opacity in the left infrahilar retrocardiac lower lobe and right lung base concerning for pneumonia with worsening. Stable small right pleural  effusion. Cardiomegaly. Electronically Signed   By: Telford Nab M.D.   On: 04/13/2021 05:49   DG Chest Port 1 View  Result Date: 04/12/2021 CLINICAL DATA:  SOB EXAM: PORTABLE CHEST 1 VIEW COMPARISON:  August 2022 FINDINGS: Implantable cardiac device with stable position of the lead. The heart appears within normal limits for size. Probable small right pleural effusion. No left pleural effusion. No pneumothorax. Heterogeneous right lower lung airspace opacities. Osteopenia with healing/chronic right rib fractures, present on previous exam. Median sternotomy wires. IMPRESSION: There are new right lower lung heterogeneous airspace opacities with small right pleural effusion. Electronically Signed   By: Albin Felling M.D.   On: 04/12/2021 15:17    Orson Eva, DO  [facility]  If 7PM-7AM, please contact night-coverage [email or web address] Password [email or web address] 04/13/2021, 7:09 AM   LOS: 1 day

## 2021-04-13 NOTE — Progress Notes (Signed)
RN called due to patient's blood glucose of 51, this increased to 64 with orange juice.  Transitory D5 NS was started with serial CBGs.  Patient will continue to be encouraged to eat.

## 2021-04-13 NOTE — Progress Notes (Addendum)
Patient BP 79/57 at start of shift, off going RN stated that fluid bolus started per Dr Tat orders. Per report, patient has become increasingly confused and restless overnight, remained on Bipap. Spouse remains at bedside.   BP recheck currently 108/52.

## 2021-04-13 NOTE — TOC Initial Note (Signed)
Transition of Care [facility] - Initial/Assessment Note    Patient Details  Name: [name] MRN: [id number] Date of Birth: [birth year]  [facility] CM/SW Contact:    Natasha Bence, LCSW Phone Number: 04/13/2021, 10:50 AM  Clinical Narrative:                 Patient is a 87 year old male admitted for  Acute respiratory failure with hypoxia. CSW observed patient's high readmission risk score. CSW conducted risk assessment and initial assessment. Patient's wife reported that patient is ambulatory at baseline and able to complete most ADL's at baseline. Patient utilizes walker at baseline. Patient's wife agreeable to [facility] if recommended but would need to further discuss SNF if recommended. Patient is fully vaccinated for Covid 19. TOC to follow.  Expected Discharge Plan: [place] Barriers to Discharge: Continued Medical Work up   Patient Goals and CMS Choice Patient states their goals for this hospitalization and ongoing recovery are:: Return home with home health CMS Medicare.gov Compare Post Acute Care list provided to:: Patient Choice offered to / list presented to : Patient  Expected Discharge Plan and Services Expected Discharge Plan: [place]                                              Prior Living Arrangements/Services   Lives with:: Self, Spouse Patient language and need for interpreter reviewed:: No Do you feel safe going back to the place where you live?: Yes      Need for Family Participation in Patient Care: Yes (Comment) Care giver support system in place?: Yes (comment)   Criminal Activity/Legal Involvement Pertinent to Current Situation/Hospitalization: Yes - Comment as needed  Activities of Daily Living Home Assistive Devices/Equipment: Cane (specify quad or straight), Eyeglasses, Grab bars in shower ADL Screening (condition at time of admission) Patient's cognitive ability adequate to safely  complete daily activities?: Yes Is the patient deaf or have difficulty hearing?: Yes Does the patient have difficulty seeing, even when wearing glasses/contacts?: No Does the patient have difficulty concentrating, remembering, or making decisions?: No Patient able to express need for assistance with ADLs?: Yes Does the patient have difficulty dressing or bathing?: Yes Independently performs ADLs?: No Communication: Independent Dressing (OT): Needs assistance Is this a change from baseline?: Change from baseline, expected to last >3 days Grooming: Needs assistance Is this a change from baseline?: Change from baseline, expected to last >3 days Feeding: Needs assistance Is this a change from baseline?: Change from baseline, expected to last >3 days Bathing: Needs assistance Is this a change from baseline?: Change from baseline, expected to last >3 days Toileting: Needs assistance Is this a change from baseline?: Change from baseline, expected to last >3days In/Out Bed: Needs assistance Is this a change from baseline?: Change from baseline, expected to last >3 days Walks in Home: Needs assistance Is this a change from baseline?: Change from baseline, expected to last >3 days Does the patient have difficulty walking or climbing stairs?: Yes Weakness of Legs: Both Weakness of Arms/Hands: None  Permission Sought/Granted Permission sought to share information with : Family Supports Permission granted to share information with : Yes, Verbal Permission Granted  Share Information with NAME: Luke, [name] (Spouse)   [phone]  Permission granted to share info w AGENCY: [place] granted  to share info w Relationship: (Spouse)  Permission granted to share info w Contact Information: [phone]  Emotional Assessment       Orientation: : Oriented to Self, Oriented to Place Alcohol / Substance Use: Not Applicable Psych Involvement: No (comment)  Admission diagnosis:   Respiratory distress [R06.03] Acute combined systolic and diastolic congestive heart failure (HCC) [I50.41] Acute on chronic congestive heart failure ([place]) [I50.9] Patient Active Problem List   Diagnosis Date Noted   Sepsis due to undetermined organism ([place]) 04/13/2021   Lobar pneumonia ([place]) 04/13/2021   Acute respiratory failure with hypoxia and hypercarbia ([place]) 04/13/2021   Acute on chronic congestive heart failure ([place]) 04/12/2021   Acute respiratory failure with hypoxia ([place]) 04/12/2021   Unstable angina (HCC)    Family history of CABG    Chest pain 12/16/2020   Acute renal failure superimposed on stage 3 chronic kidney disease, unspecified acute renal failure type, unspecified whether stage 3a or 3b CKD ([place]) 11/07/2020   AKI (acute kidney injury) ([place]) 11/07/2020   Acute renal failure (ARF) ([place]) 11/07/2020   Gout involving toe 05/26/2019   Vitamin B 12 deficiency 07/27/2016   Myelodysplasia, low grade ([place]) 11/27/2015   Other primary cardiomyopathies 08/07/2011   Carotid stenosis 07/26/2011   Automatic implantable cardioverter-defibrillator in situ 07/26/2009   CAD, AUTOLOGOUS BYPASS GRAFT 08/24/2008   PROSTATE CANCER 08/03/2008   Other hyperlipidemia 08/03/2008   Essential hypertension 08/03/2008   MYOCARDIAL INFARCTION, HX OF 08/03/2008   Coronary artery disease 08/03/2008   Ischemic cardiomyopathy 12/02/5748   SYSTOLIC HEART FAILURE, CHRONIC 08/03/2008   NEPHROLITHIASIS, HX OF 08/03/2008   POSTSURGICAL AORTOCORONARY BYPASS STATUS 08/03/2008   PCP:  [name], MD Pharmacy:   [facility], [place] [place] [address] AT [place]. [place] [address] Phone: [phone] Fax: [phone]     Social Determinants of Health (SDOH) Interventions    Readmission Risk Interventions Readmission Risk Prevention Plan 04/13/2021  Transportation Screening Complete  PCP or Specialist Appt within 3-5  Days Complete  HRI or [place] Complete  Social Work Consult for [place] Planning/Counseling Complete  Palliative Care Screening Complete  Medication Review Press photographer) Complete  Some recent data might be hidden

## 2021-04-13 NOTE — Progress Notes (Signed)
**Note De-Identified  Obfuscation** RN informs that patient continues to remove himself from BIPAP.  Patient is alert and oriented. Patient placed on 10 L HFNC will titrate as tolerated.  BBS crs which is cleared with cough. SAT 90%.RRT to continue to monitor.

## 2021-04-13 NOTE — Evaluation (Addendum)
Physical Therapy Evaluation Patient Details Name: [name] MRN: [id number] DOB: [birth year] Today's Date: 04/13/2021  History of Present Illness  [name] is a 87 y.o. male with medical history significant for cardiomyopathy, hypertension, systolic CHF, coronary artery disease.  Patient was brought to the ED via EMS with complaints of difficulty breathing that started yesterday evening, and worsened this morning.  Spouse is at bedside and assist with the history.  He has bilateral lower extremity swelling, spouse and wife are not sure how long she has had this. Not on Lasix.  Spouse also reports patient has had a very bad cough, productive of cream colored phlegm.  EMS reports patient's O2 sats was 60% on room air.  He was placed on 15 L nonrebreather with sats improving to 96%   Clinical Impression  Patient presents in bed awake, alert, and agreeable for therapy on 9 LPM of O2 w/ SpO2 of 95%. Patient performs bed mobility w/ slow labored movement, requires frequent rest breaks due to SOB and physical assist due to generalized weakness. Patient performs a sit to stand transfer w/out an AD, uses the back of B LE against bed for support, demonstrates slow movement and unsteadiness on feet, requires Min assist due to generalized weakness. Patient ambulates forwards and backwards in room using a RW, demonstrates slow labored movement and SOB, distance limited due to weakness, fatigue, and SOB, requires Min assist, SpO2 dropped to 85%. Patient tolerated sitting up in chair after therapy, w/ family members at bedside, SpO2 at 94%. Patient will benefit from continued skilled physical therapy in hospital and recommended venue below to increase strength, balance, endurance for safe ADLs and gait.        Recommendations for follow up therapy are one component of a multi-disciplinary discharge planning process, led by the attending physician.  Recommendations may be updated based on patient status,  additional functional criteria and insurance authorization.  Follow Up Recommendations Skilled nursing-short term rehab (<3 hours/day)    Assistance Recommended at Discharge Frequent or constant Supervision/Assistance  Functional Status Assessment Patient has had a recent decline in their functional status and demonstrates the ability to make significant improvements in function in a reasonable and predictable amount of time.  Equipment Recommendations  None recommended by PT    Recommendations for Other Services       Precautions / Restrictions Precautions Precautions: Fall Restrictions Weight Bearing Restrictions: No      Mobility  Bed Mobility Overal bed mobility: Needs Assistance Bed Mobility: Supine to Sit     Supine to sit: Min assist;Mod assist     General bed mobility comments: Slow labored movement, requires short rest breaks due to SOB    Transfers Overall transfer level: Needs assistance Equipment used: Rolling walker (2 wheels) Transfers: Sit to/from Stand;Bed to chair/wheelchair/BSC Sit to Stand: Min assist   Step pivot transfers: Min assist       General transfer comment: Performed sit to stand w/out AD, used B LE against bed for support. Used RW for step pivot transfers, demonstrates steadiness on feet, SOB, B LE weakness.    Ambulation/Gait Ambulation/Gait assistance: Min assist Gait Distance (Feet): 6 Feet Assistive device: Rolling walker (2 wheels) Gait Pattern/deviations: Decreased step length - right;Decreased step length - left;Decreased stride length Gait velocity: Decreased     General Gait Details: Slow labored movement, SOB, slow cadence, steady on feet using RW  Science writer  Modified Rankin (Stroke Patients Only)       Balance Overall balance assessment: Needs assistance Sitting-balance support: Bilateral upper extremity supported;Feet supported Sitting balance-Leahy Scale: Fair Sitting  balance - Comments: seated at EOB   Standing balance support: Bilateral upper extremity supported;During functional activity;Reliant on assistive device for balance Standing balance-Leahy Scale: Fair Standing balance comment: fair using RW                             Pertinent Vitals/Pain Pain Assessment: No/denies pain    Home Living Family/patient expects to be discharged to:: Private residence Living Arrangements: Spouse/significant other Available Help at Discharge: Available 24 hours/day;Family Type of Home: House Home Access: Stairs to enter Entrance Stairs-Rails: Right;Left;Can reach both Entrance Stairs-Number of Steps: 3   Home Layout: One level;Able to live on main level with bedroom/bathroom Home Equipment: Grab bars - tub/shower;Cane - single point      Prior Function               Mobility Comments: Modified independent, household ambulator uses furniture for support, uses cane for community distances ADLs Comments: Assisted by family as needed.     Hand Dominance   Dominant Hand: Right    Extremity/Trunk Assessment   Upper Extremity Assessment Upper Extremity Assessment: Overall WFL for tasks assessed    Lower Extremity Assessment Lower Extremity Assessment: Generalized weakness       Communication   Communication: HOH  Cognition Arousal/Alertness: Awake/alert Behavior During Therapy: WFL for tasks assessed/performed Overall Cognitive Status: Within Functional Limits for tasks assessed                                          General Comments      Exercises     Assessment/Plan    PT Assessment Patient needs continued PT services  PT Problem List Decreased strength;Decreased mobility;Decreased safety awareness;Decreased range of motion;Decreased activity tolerance;Decreased coordination;Decreased balance;Decreased knowledge of use of DME       PT Treatment Interventions DME instruction;Therapeutic  exercise;Gait training;Balance training;Stair training;Functional mobility training;Therapeutic activities;Patient/family education    PT Goals (Current goals can be found in the Care Plan section)  Acute Rehab PT Goals Patient Stated Goal: Return home after rehab. PT Goal Formulation: With patient/family Time For Goal Achievement: 04/27/21 Potential to Achieve Goals: Good    Frequency Min 3X/week   Barriers to discharge        Co-evaluation               AM-PAC PT "6 Clicks" Mobility  Outcome Measure Help needed turning from your back to your side while in a flat bed without using bedrails?: A Little Help needed moving from lying on your back to sitting on the side of a flat bed without using bedrails?: A Lot Help needed moving to and from a bed to a chair (including a wheelchair)?: A Little Help needed standing up from a chair using your arms (e.g., wheelchair or bedside chair)?: A Little Help needed to walk in hospital room?: A Little Help needed climbing 3-5 steps with a railing? : A Lot 6 Click Score: 16    End of Session   Activity Tolerance: Patient tolerated treatment well;Patient limited by fatigue Patient left: in chair;with call bell/phone within reach;with family/visitor present Nurse Communication: Mobility status PT Visit Diagnosis: Unsteadiness on feet (R26.81);Other abnormalities  of gait and mobility (R26.89);Muscle weakness (generalized) (M62.81)    Time: 1340-1410 PT Time Calculation (min) (ACUTE ONLY): 30 min   Charges:   PT Evaluation $PT Eval Moderate Complexity: 1 Mod PT Treatments $Therapeutic Activity: 23-37 mins        Cassie Jones, SPT  During this treatment session, the therapist was present, participating in and directing the treatment.  3:43 PM, 04/13/21 Lonell Grandchild, MPT Physical Therapist with [facility] [phone] office [phone] mobile phone

## 2021-04-13 NOTE — Progress Notes (Signed)
Patient presented with fever of 102.67F and was tachypneic.  Chest x-ray was ordered, procalcitonin was checked.  Procalcitonin was noted to be 43.33 and chest x-ray was suggestive of pneumonia. patient will be started on IV antibiotics.

## 2021-04-13 NOTE — Progress Notes (Signed)
Pt placed back on BIPAP due to wob and decrease SATs . Patient placed back on previous settings from ED pt tolerating well RT will continue to monitor

## 2021-04-13 NOTE — Progress Notes (Signed)
Initial Nutrition Assessment  DOCUMENTATION CODES:      INTERVENTION:  Ensure Enlive po BID, each supplement provides 350 kcal and 20 grams of protein   Low sodium diet encouraged  NUTRITION DIAGNOSIS:   Inadequate oral intake related to acute illness as evidenced by per patient/family report.   GOAL:  Patient will meet greater than or equal to 90% of their needs  MONITOR:  PO intake, Weight trends, I & O's, Supplement acceptance, Labs  REASON FOR ASSESSMENT:   Malnutrition Screening Tool    ASSESSMENT: Patient is a 87 yo male from home with hx of CAD, CHF, Vit B-12 deficiency and prostate cancer. He presents with shortness of breath, sespsis associated with pneumonia. Acute respiratory failure and requiring 10 L HFNC.  Appetite is good at baseline. His wife of 5 years provided history. Patient started getting sick on Monday and has decreased intake since. Today he is hungry and asking for food.   Their eating pattern is 3 meals daily. She says, "we eat like pigs". He weighs daily usual is between 162-164 lb.   Low salt diet encouraged. Medications reviewed.     Intake/Output Summary (Last 24 hours) at 04/13/2021 1435 Last data filed at 04/13/2021 0530 Gross per 24 hour  Intake 169.96 ml  Output 100 ml  Net 69.96 ml     Labs: BMP Latest Ref Rng & Units 04/13/2021 04/12/2021 04/12/2021  Glucose 70 - 99 mg/dL 109(H) 121(H) 209(H)  BUN 8 - 23 mg/dL 31(H) 29(H) 24(H)  Creatinine 0.61 - 1.24 mg/dL 2.12(H) 2.06(H) 1.53(H)  BUN/Creat Ratio 6 - 22 (calc) - - -  Sodium 135 - 145 mmol/L 137 137 137  Potassium 3.5 - 5.1 mmol/L 4.0 4.0 3.7  Chloride 98 - 111 mmol/L 103 101 101  CO2 22 - 32 mmol/L 25 26 26   Calcium 8.9 - 10.3 mg/dL 8.5(L) 8.8(L) 9.0      NUTRITION - FOCUSED PHYSICAL EXAM: Unable to complete Nutrition-Focused physical exam at this time.     Diet Order:   Diet Order             Diet Heart Room service appropriate? Yes; Fluid consistency: Thin  Diet  effective now                   EDUCATION NEEDS:  Education needs have been addressed  Skin:  Skin Assessment: Reviewed RN Assessment (skin tear to left arm)  Last BM:  12/1 type 7  Height:   Ht Readings from Last 1 Encounters:  04/12/21 5\' 4"  (1.626 m)    Weight:   Wt Readings from Last 1 Encounters:  04/13/21 72.2 kg    Ideal Body Weight:   55 kg  BMI:  Body mass index is 27.32 kg/m.  Estimated Nutritional Needs:   Kcal:  1800-1900  Protein:  86-94 gr  Fluid:  < 2liters daily   Colman Cater MS,RD,CSG,LDN Contact: AMION

## 2021-04-14 ENCOUNTER — Inpatient Hospital Stay (HOSPITAL_COMMUNITY): Payer: Medicare Other

## 2021-04-14 DIAGNOSIS — A419 Sepsis, unspecified organism: Secondary | ICD-10-CM | POA: Diagnosis not present

## 2021-04-14 DIAGNOSIS — N179 Acute kidney failure, unspecified: Secondary | ICD-10-CM

## 2021-04-14 DIAGNOSIS — J181 Lobar pneumonia, unspecified organism: Secondary | ICD-10-CM | POA: Diagnosis not present

## 2021-04-14 DIAGNOSIS — I4891 Unspecified atrial fibrillation: Secondary | ICD-10-CM | POA: Diagnosis not present

## 2021-04-14 DIAGNOSIS — J9601 Acute respiratory failure with hypoxia: Secondary | ICD-10-CM | POA: Diagnosis not present

## 2021-04-14 DIAGNOSIS — Z95810 Presence of automatic (implantable) cardiac defibrillator: Secondary | ICD-10-CM | POA: Diagnosis not present

## 2021-04-14 LAB — CBC WITH DIFFERENTIAL/PLATELET
Abs Immature Granulocytes: 0.7 10*3/uL — ABNORMAL HIGH (ref 0.00–0.07)
Band Neutrophils: 7 %
Basophils Absolute: 0 10*3/uL (ref 0.0–0.1)
Basophils Relative: 0 %
Eosinophils Absolute: 0 10*3/uL (ref 0.0–0.5)
Eosinophils Relative: 0 %
HCT: 42.2 % (ref 39.0–52.0)
Hemoglobin: 12.9 g/dL — ABNORMAL LOW (ref 13.0–17.0)
Lymphocytes Relative: 10 %
Lymphs Abs: 1.2 10*3/uL (ref 0.7–4.0)
MCH: 30 pg (ref 26.0–34.0)
MCHC: 30.6 g/dL (ref 30.0–36.0)
MCV: 98.1 fL (ref 80.0–100.0)
Metamyelocytes Relative: 5 %
Monocytes Absolute: 4.6 10*3/uL — ABNORMAL HIGH (ref 0.1–1.0)
Monocytes Relative: 40 %
Myelocytes: 1 %
Neutro Abs: 5.1 10*3/uL (ref 1.7–7.7)
Neutrophils Relative %: 37 %
Platelets: 79 10*3/uL — ABNORMAL LOW (ref 150–400)
RBC: 4.3 MIL/uL (ref 4.22–5.81)
RDW: 14.2 % (ref 11.5–15.5)
WBC: 11.6 10*3/uL — ABNORMAL HIGH (ref 4.0–10.5)
nRBC: 0 % (ref 0.0–0.2)

## 2021-04-14 LAB — COMPREHENSIVE METABOLIC PANEL
ALT: 26 U/L (ref 0–44)
AST: 33 U/L (ref 15–41)
Albumin: 3 g/dL — ABNORMAL LOW (ref 3.5–5.0)
Alkaline Phosphatase: 63 U/L (ref 38–126)
Anion gap: 12 (ref 5–15)
BUN: 48 mg/dL — ABNORMAL HIGH (ref 8–23)
CO2: 19 mmol/L — ABNORMAL LOW (ref 22–32)
Calcium: 8 mg/dL — ABNORMAL LOW (ref 8.9–10.3)
Chloride: 105 mmol/L (ref 98–111)
Creatinine, Ser: 2.83 mg/dL — ABNORMAL HIGH (ref 0.61–1.24)
GFR, Estimated: 20 mL/min — ABNORMAL LOW (ref >60)
Glucose, Bld: 118 mg/dL — ABNORMAL HIGH (ref 70–99)
Potassium: 5.1 mmol/L (ref 3.5–5.1)
Sodium: 136 mmol/L (ref 135–145)
Total Bilirubin: 0.8 mg/dL (ref 0.3–1.2)
Total Protein: 5.7 g/dL — ABNORMAL LOW (ref 6.5–8.1)

## 2021-04-14 LAB — GLUCOSE, CAPILLARY
Glucose-Capillary: 104 mg/dL — ABNORMAL HIGH (ref 70–99)
Glucose-Capillary: 106 mg/dL — ABNORMAL HIGH (ref 70–99)
Glucose-Capillary: 112 mg/dL — ABNORMAL HIGH (ref 70–99)
Glucose-Capillary: 115 mg/dL — ABNORMAL HIGH (ref 70–99)
Glucose-Capillary: 138 mg/dL — ABNORMAL HIGH (ref 70–99)
Glucose-Capillary: 145 mg/dL — ABNORMAL HIGH (ref 70–99)

## 2021-04-14 LAB — PROCALCITONIN: Procalcitonin: 57.55 ng/mL

## 2021-04-14 LAB — URINE CULTURE

## 2021-04-14 MED ORDER — SIMVASTATIN 20 MG PO TABS
40.0000 mg | ORAL_TABLET | Freq: Every day | ORAL | Status: DC
  Administered 2021-04-14 – 2021-04-17 (×4): 40 mg via ORAL
  Filled 2021-04-14 (×5): qty 2

## 2021-04-14 MED ORDER — ASPIRIN EC 81 MG PO TBEC
81.0000 mg | DELAYED_RELEASE_TABLET | Freq: Every day | ORAL | Status: DC
  Administered 2021-04-14 – 2021-04-25 (×10): 81 mg via ORAL
  Filled 2021-04-14 (×10): qty 1

## 2021-04-14 NOTE — Progress Notes (Signed)
PROGRESS NOTE  [name] IRS:[id number] DOB: [birth year] DOA: 04/12/2021 PCP: [name], MD  Brief History:  87 year old male with a history of coronary artery disease status post CABG, systolic CHF with EF 00-93%, AICD, prostate cancer, MDS, hypertension, hyperlipidemia presenting with shortness of breath of 1 week duration.  History is supplemented by the patient's wife.  Apparently his shortness of breath continue to worsen over the past 2 to 3 days prior to admission.  He had a cough with cream-colored sputum.  There is no hemoptysis, fevers, chills, chest pain, nausea, vomiting, diarrhea, abdominal pain.  The patient has bilateral lower extremity edema which appears to be chronic.  Spouse states that his edema is actually a little bit better than usual.  Upon EMS activation.  The patient was noted to have oxygen saturation of 60% on room air.  He was placed on nonrebreather.  In the emergency department, patient was initially afebrile and hemodynamically stable.  He was placed on 6 L with oxygen saturation in the low 90s.  Chest x-ray showed bibasilar opacities, regular in the left.  BMP showed sodium sodium 137, potassium 3.7, bicarbonate 26, serum creatinine 1.53.  WBC 5.9, hemoglobin 13.8, platelets 86,000.   Notably, the patient had a hospital admission from 12/17/2020 to 12/20/2020 for chest pain concerning for unstable angina.  He was started on IV heparin.  He underwent cardiac catheterization.  . Repeat catheterization showed 3/3 patent grafts and the likely culprit lesion was 80% stenosis in the distal LPAV prior to bifurcation into LPL 1 and LPL 2 which was filled from retrograde flow from the SVG to OM 2 and was not approachable for PCI and medical management was recommended. Coreg was increased to 12.5 mg twice daily and Losartan increased to 50 mg daily. He was continued on ASA 81 mg daily and Crestor 20 mg daily with consideration of adding Spironolactone or an SGLT2  inhibitor as an outpatient if renal function remained stable (was at 1.05 on the day of discharge).  He was admitted to the ICU.  He continued to have hypoxia and was placed on BiPAP.  He subsequently developed hypotension requiring fluid resuscitation.  UA was negative for pyuria.   Assessment/Plan: Sepsis -Present on admission -Presented with tachypnea, fever, and respiratory failure -Secondary to pneumonia -PCT 43.33 -Follow blood cultures--neg to date -UA negative for pyuria -remains of neosynephrine 25 mcg -continue solucortef   Lobar pneumonia -Personally reviewed chest x-ray--right greater than left basilar opacity -Continue cefepime and azithromycin -MRSA screen neg -PCT 43>>57.55   Acute respiratory failure with hypoxia and hypercarbia -Secondary to pneumonia -Currently on BiPAP FiO2 0.5 -Wean back to room air as tolerated for saturation greater 90% -weaned to 4L   Chronic systolic CHF/ischemic cardiomyopathy -12/17/2020 echo EF 40 to 45%, +WMA -Holding carvedilol, losartan, spironolactone secondary to hypotension -Holding furosemide temporarily  Paroxysmal Afib -transient -now in sinus with PACx -do not plan AC currently unless recurrent   Essential hypertension -Holding losartan, carvedilol, spironolactone secondary to hypotension   Coronary artery disease -No chest pain presently -Continue aspirin -Holding carvedilol, spironolactone, losartan secondary to hypotension -resume ASA and statin   Acute on chronic renal failure--CKD stage IIIa -Baseline creatinine 1.1-1.3 -In part due to hypotension and furosemide -serum creatinine peaking 2.83 -renal US   Hyperlipidemia -Continue statin   Tobacco abuse in remission -50 pk yr -quit 20 years ago       Status is: Inpatient  Remains inpatient appropriate because: hemodynamic instability, severity of illness requiring BiPAP and IV abx               Family Communication:   son updated at  bedside12/2   Consultants:  cardiology   Code Status:  FULL    DVT Prophylaxis:   Heparin      Procedures: As Listed in Progress Note Above   Antibiotics: Cefepime 12/1>> Azithro 12/1>>     The patient is critically ill with multiple organ systems failure and requires high complexity decision making for assessment and support, frequent evaluation and titration of therapies, application of advanced monitoring technologies and extensive interpretation of multiple databases.  Critical care time - 35 mins.      Subjective: Patient denies fevers, chills, headache, chest pain, dyspnea, nausea, vomiting, diarrhea, abdominal pain, dysuria, hematuria, hematochezia, and melena.   Objective: Vitals:   04/14/21 1315 04/14/21 1330 04/14/21 1345 04/14/21 1400  BP: 101/60 (!) 113/45 (!) 130/56 110/66  Pulse: 94 88 86 90  Resp: 20 15 17 16   Temp:      TempSrc:      SpO2: 95% 94% 96% 97%  Weight:      Height:        Intake/Output Summary (Last 24 hours) at 04/14/2021 1406 Last data filed at 04/14/2021 0900 Gross per 24 hour  Intake 2327.23 ml  Output 100 ml  Net 2227.23 ml   Weight change: -0.204 kg Exam:  General:  Pt is alert, follows commands appropriately, not in acute distress HEENT: No icterus, No thrush, No neck mass, [place]/AT Cardiovascular: RRR, S1/S2, no rubs, no gallops Respiratory: bilateral rhonchi. No wheeze Abdomen: Soft/+BS, non tender, non distended, no guarding Extremities: trace LE edema, No lymphangitis, No petechiae, No rashes, no synovitis   Data Reviewed: I have personally reviewed following labs and imaging studies Basic Metabolic Panel: Recent Labs  Lab 04/12/21 1453 04/12/21 2316 04/13/21 0142 04/14/21 0500  NA 137 137 137 136  K 3.7 4.0 4.0 5.1  CL 101 101 103 105  CO2 26 26 25  19*  GLUCOSE 209* 121* 109* 118*  BUN 24* 29* 31* 48*  CREATININE 1.53* 2.06* 2.12* 2.83*  CALCIUM 9.0 8.8* 8.5* 8.0*  MG  --   --  1.4*  --   PHOS  --   --   3.4  --    Liver Function Tests: Recent Labs  Lab 04/12/21 1453 04/14/21 0500  AST 42* 33  ALT 36 26  ALKPHOS 72 63  BILITOT 2.3* 0.8  PROT 6.4* 5.7*  ALBUMIN 3.7 3.0*   No results for input(s): LIPASE, AMYLASE in the last 168 hours. No results for input(s): AMMONIA in the last 168 hours. Coagulation Profile: No results for input(s): INR, PROTIME in the last 168 hours. CBC: Recent Labs  Lab 04/12/21 1453 04/12/21 2316 04/13/21 0142 04/14/21 0500  WBC 5.9 11.0* 9.2 11.6*  NEUTROABS 2.5  --   --  5.1  HGB 13.8 12.9* 12.7* 12.9*  HCT 42.8 41.6 40.3 42.2  MCV 94.7 96.5 95.3 98.1  PLT 86* 86* 84* 79*   Cardiac Enzymes: No results for input(s): CKTOTAL, CKMB, CKMBINDEX, TROPONINI in the last 168 hours. BNP: Invalid input(s): POCBNP CBG: Recent Labs  Lab 04/13/21 2200 04/14/21 0019 04/14/21 0435 04/14/21 0738 04/14/21 1126  GLUCAP 64* 104* 112* 115* 106*   HbA1C: No results for input(s): HGBA1C in the last 72 hours. Urine analysis:    Component Value Date/Time   COLORURINE YELLOW  04/13/2021 [place] 04/13/2021 0348   LABSPEC 1.025 04/13/2021 0348   PHURINE 5.0 04/13/2021 0348   GLUCOSEU NEGATIVE 04/13/2021 0348   HGBUR SMALL (A) 04/13/2021 0348   BILIRUBINUR NEGATIVE 04/13/2021 0348   KETONESUR NEGATIVE 04/13/2021 0348   PROTEINUR 100 (A) 04/13/2021 0348   NITRITE NEGATIVE 04/13/2021 0348   LEUKOCYTESUR SMALL (A) 04/13/2021 0348   Sepsis Labs: @LABRCNTIP (procalcitonin:4,lacticidven:4) ) Recent Results (from the past 240 hour(s))  Resp Panel by RT-PCR (Flu A&B, Covid) Nasopharyngeal Swab     Status: None   Collection Time: 04/12/21  2:59 PM   Specimen: Nasopharyngeal Swab; Nasopharyngeal(NP) swabs in vial transport medium  Result Value Ref Range Status   SARS Coronavirus 2 by RT PCR NEGATIVE NEGATIVE Final    Comment: (NOTE) SARS-CoV-2 target nucleic acids are NOT DETECTED.  The SARS-CoV-2 RNA is generally detectable in upper  respiratory specimens during the acute phase of infection. The lowest concentration of SARS-CoV-2 viral copies this assay can detect is 138 copies/mL. A negative result does not preclude SARS-Cov-2 infection and should not be used as the sole basis for treatment or other patient management decisions. A negative result may occur with  improper specimen collection/handling, submission of specimen other than nasopharyngeal swab, presence of viral mutation(s) within the areas targeted by this assay, and inadequate number of viral copies(<138 copies/mL). A negative result must be combined with clinical observations, patient history, and epidemiological information. The expected result is Negative.  Fact Sheet for Patients:  EntrepreneurPulse.com.au  Fact Sheet for Healthcare Providers:  IncredibleEmployment.be  This test is no t yet approved or cleared by the [place] FDA and  has been authorized for detection and/or diagnosis of SARS-CoV-2 by FDA under an Emergency Use Authorization (EUA). This EUA will remain  in effect (meaning this test can be used) for the duration of the COVID-19 declaration under Section 564(b)(1) of the Act, 21 U.S.C.section 360bbb-3(b)(1), unless the authorization is terminated  or revoked sooner.       Influenza A by PCR NEGATIVE NEGATIVE Final   Influenza B by PCR NEGATIVE NEGATIVE Final    Comment: (NOTE) The Xpert Xpress SARS-CoV-2/FLU/RSV plus assay is intended as an aid in the diagnosis of influenza from Nasopharyngeal swab specimens and should not be used as a sole basis for treatment. Nasal washings and aspirates are unacceptable for Xpert Xpress SARS-CoV-2/FLU/RSV testing.  Fact Sheet for Patients: EntrepreneurPulse.com.au  Fact Sheet for Healthcare Providers: IncredibleEmployment.be  This test is not yet approved or cleared by the [place] FDA and has been  authorized for detection and/or diagnosis of SARS-CoV-2 by FDA under an Emergency Use Authorization (EUA). This EUA will remain in effect (meaning this test can be used) for the duration of the COVID-19 declaration under Section 564(b)(1) of the Act, 21 U.S.C. section 360bbb-3(b)(1), unless the authorization is terminated or revoked.  Performed at [facility], [address] [place],  [address]   MRSA Next Gen by PCR, Nasal     Status: None   Collection Time: 04/12/21  7:50 PM   Specimen: Nasal Mucosa; Nasal Swab  Result Value Ref Range Status   MRSA by PCR Next Gen NOT DETECTED NOT DETECTED Final    Comment: (NOTE) The GeneXpert MRSA Assay (FDA approved for NASAL specimens only), is one component of a comprehensive MRSA colonization surveillance program. It is not intended to diagnose MRSA infection nor to guide or monitor treatment for MRSA infections. Test performance is not FDA approved in patients less  than 66 years old. Performed at [facility], [address] [place], [place] [address]   Urine Culture     Status: Abnormal   Collection Time: 04/13/21  3:48 AM   Specimen: Urine, Clean Catch  Result Value Ref Range Status   Specimen Description   Final    URINE, CLEAN CATCH Performed at [facility], [address] [place], [place] [address]    Special Requests   Final    NONE Performed at [facility], [address], [place], [place] [address]    Culture MULTIPLE SPECIES PRESENT, SUGGEST RECOLLECTION (A)  Final   Report Status 04/14/2021 FINAL  Final  Culture, blood (Routine X 2) w Reflex to ID Panel     Status: None (Preliminary result)   Collection Time: 04/13/21  4:18 AM   Specimen: Right Antecubital; Blood  Result Value Ref Range Status   Specimen Description RIGHT ANTECUBITAL  Final   Special Requests   Final    BOTTLES DRAWN AEROBIC AND ANAEROBIC Blood Culture adequate volume   Culture   Final    NO GROWTH 1 DAY Performed at [facility], [address] [place], [place] [address]    Report Status PENDING  Incomplete  Culture, blood (Routine X 2) w Reflex to ID Panel     Status: None (Preliminary result)   Collection Time: 04/13/21  4:18 AM   Specimen: BLOOD RIGHT HAND  Result Value Ref Range Status   Specimen Description BLOOD RIGHT HAND  Final   Special Requests   Final    BOTTLES DRAWN AEROBIC AND ANAEROBIC Blood Culture adequate volume   Culture   Final    NO GROWTH 1 DAY Performed at [facility], [address] [place], [place] [address]    Report Status PENDING  Incomplete     Scheduled Meds:  aspirin EC  81 mg Oral Daily   Chlorhexidine Gluconate Cloth  6 each Topical Daily   feeding supplement  237 mL Oral BID BM   hydrocortisone sod succinate (SOLU-CORTEF) inj  75 mg Intravenous Q12H   mouth rinse  15 mL Mouth Rinse BID   simvastatin  40 mg Oral q1800   Continuous Infusions:  sodium chloride 250 mL (04/13/21 0915)   azithromycin 250 mL/hr at 04/14/21 0822   ceFEPime (MAXIPIME) IV Stopped (04/14/21 0655)   phenylephrine (NEO-SYNEPHRINE) Adult infusion 25 mcg/min (04/14/21 1308)    Procedures/Studies: NM Pulmonary Perfusion  Result Date: 04/13/2021 CLINICAL DATA:  Respiratory failure. Acute hypoxic respiratory failure. EXAM: NUCLEAR MEDICINE PERFUSION LUNG SCAN TECHNIQUE: Perfusion images were obtained in multiple projections after intravenous injection of radiopharmaceutical. Ventilation scans intentionally deferred if perfusion scan and chest x-ray adequate for interpretation during COVID 19 epidemic. RADIOPHARMACEUTICALS:  4.2 mCi Tc-19m MAA IV COMPARISON:  Chest radiography same day FINDINGS: No evidence of segmental or subsegmental perfusion anomalies. Small amount of fluid seen in the fissures. Findings PICC low probability of pulmonary emboli. IMPRESSION: No segmental or subsegmental perfusion anomalies. Fissure signs which could be seen with small effusions/early congestive heart failure. Study predicts low  probability of pulmonary emboli. Electronically Signed   By: Nelson Chimes M.D.   On: 04/13/2021 13:36   DG CHEST PORT 1 VIEW  Result Date: 04/13/2021 CLINICAL DATA:  Difficulty breathing. EXAM: PORTABLE CHEST 1 VIEW COMPARISON:  Portable chest yesterday at 3:09 p.m. FINDINGS: 4:47 a.m., 04/13/2021. There is mild-to-moderate cardiomegaly. Mild central vascular fullness continues to be noted without appreciable edema. Sternotomy and CABG changes are redemonstrated as  well as a single lead left chest cardiac assist device and wire insertion. There is increased streaky atelectasis or infiltrate in the retrocardiac left lower lobe and increased patchy opacities in the right lung base concerning for pneumonia with a small underlying right pleural effusion. Remaining lung fields remain clear. No other interval changes. Osteopenia. IMPRESSION: Increasing opacity in the left infrahilar retrocardiac lower lobe and right lung base concerning for pneumonia with worsening. Stable small right pleural effusion. Cardiomegaly. Electronically Signed   By: Telford Nab M.D.   On: 04/13/2021 05:49   DG Chest Port 1 View  Result Date: 04/12/2021 CLINICAL DATA:  SOB EXAM: PORTABLE CHEST 1 VIEW COMPARISON:  August 2022 FINDINGS: Implantable cardiac device with stable position of the lead. The heart appears within normal limits for size. Probable small right pleural effusion. No left pleural effusion. No pneumothorax. Heterogeneous right lower lung airspace opacities. Osteopenia with healing/chronic right rib fractures, present on previous exam. Median sternotomy wires. IMPRESSION: There are new right lower lung heterogeneous airspace opacities with small right pleural effusion. Electronically Signed   By: Albin Felling M.D.   On: 04/12/2021 15:17    Orson Eva, DO  [facility]  If 7PM-7AM, please contact night-coverage [email or web address] Password [email or web address] 04/14/2021, 2:06 PM   LOS: 2 days

## 2021-04-14 NOTE — NC FL2 (Signed)
[place] LEVEL OF CARE SCREENING TOOL     IDENTIFICATION  Patient Name: [name] Birthdate: [birth year] Sex: male Admission Date (Current Location): 04/12/2021  [facility] and [place] Number:  [employer] and Address:  [facility] [address], [place]      Provider Number: [id number]  Attending Physician Name and Address:  Orson Eva, MD  Relative Name and Phone Number:       Current Level of Care: Hospital Recommended Level of Care: [place] Prior Approval Number:    Date Approved/Denied: 04/14/21 PASRR Number: [id number]  Discharge Plan: SNF    Current Diagnoses: Patient Active Problem List   Diagnosis Date Noted   Sepsis due to undetermined organism ([place]) 04/13/2021   Lobar pneumonia ([place]) 04/13/2021   Acute respiratory failure with hypoxia and hypercarbia ([place]) 04/13/2021   Acute on chronic congestive heart failure ([place]) 04/12/2021   Acute respiratory failure with hypoxia ([place]) 04/12/2021   Unstable angina ([place])    Family history of CABG    Chest pain 12/16/2020   Acute renal failure superimposed on stage 3 chronic kidney disease, unspecified acute renal failure type, unspecified whether stage 3a or 3b CKD ([place]) 11/07/2020   AKI (acute kidney injury) ([place]) 11/07/2020   Acute renal failure (ARF) ([place]) 11/07/2020   Gout involving toe 05/26/2019   Vitamin B 12 deficiency 07/27/2016   Myelodysplasia, low grade ([place]) 11/27/2015   Other primary cardiomyopathies 08/07/2011   Carotid stenosis 07/26/2011   Automatic implantable cardioverter-defibrillator in situ 07/26/2009   CAD, AUTOLOGOUS BYPASS GRAFT 08/24/2008   PROSTATE CANCER 08/03/2008   Other hyperlipidemia 08/03/2008   Essential hypertension 08/03/2008   MYOCARDIAL INFARCTION, HX OF 08/03/2008   Coronary artery disease 08/03/2008   Ischemic cardiomyopathy 13/24/4010   SYSTOLIC HEART FAILURE, CHRONIC 08/03/2008   NEPHROLITHIASIS, HX  OF 08/03/2008   POSTSURGICAL AORTOCORONARY BYPASS STATUS 08/03/2008    Orientation RESPIRATION BLADDER Height & Weight     Self, Time, Situation, Place  Normal Incontinent Weight: 167 lb 8.8 oz (76 kg) Height:  5\' 4"  (162.6 cm)  BEHAVIORAL SYMPTOMS/MOOD NEUROLOGICAL BOWEL NUTRITION STATUS      Continent Diet (Diet Heart Room service appropriate? Yes; Fluid consistency: Thin)  AMBULATORY STATUS COMMUNICATION OF NEEDS Skin   Extensive Assist Verbally Other (Comment) (L arm tear)                       Personal Care Assistance Level of Assistance  Dressing, Feeding, Bathing Bathing Assistance: Maximum assistance Feeding assistance: Independent Dressing Assistance: Maximum assistance     Functional Limitations Info  Sight, Hearing, Speech Sight Info: Adequate Hearing Info: Adequate Speech Info: Adequate    SPECIAL CARE FACTORS FREQUENCY  PT (By licensed PT), OT (By licensed OT)     PT Frequency: 5x OT Frequency: 5x            Contractures Contractures Info: Not present    Additional Factors Info  Code Status, Allergies Code Status Info: Full Allergies Info: Codeine           Current Medications (04/14/2021):  This is the current hospital active medication list Current Facility-Administered Medications  Medication Dose Route Frequency Provider Last Rate Last Admin   0.9 %  sodium chloride infusion  250 mL Intravenous Continuous Tat, [name], MD 10 mL/hr at 04/13/21 0915 250 mL at 04/13/21 0915   acetaminophen (TYLENOL) tablet 650 mg  650 mg Oral Q6H PRN Emokpae, Ejiroghene  E, MD       Or   acetaminophen (TYLENOL) suppository 650 mg  650 mg Rectal Q6H PRN Emokpae, Ejiroghene E, MD   650 mg at 04/13/21 0339   aspirin EC tablet 81 mg  81 mg Oral Daily Satira Sark, MD   81 mg at 04/14/21 1044   azithromycin (ZITHROMAX) 500 mg in sodium chloride 0.9 % 250 mL IVPB  500 mg Intravenous Q24H Orson Eva, MD 250 mL/hr at 04/14/21 2542 Infusion Verify at 04/14/21 7062    ceFEPIme (MAXIPIME) 2 g in sodium chloride 0.9 % 100 mL IVPB  2 g Intravenous Q24H Orson Eva, MD   Stopped at 04/14/21 [phone]   Chlorhexidine Gluconate Cloth 2 % PADS 6 each  6 each Topical Daily Emokpae, Ejiroghene E, MD   6 each at 04/14/21 0804   feeding supplement (ENSURE ENLIVE / ENSURE PLUS) liquid 237 mL  237 mL Oral BID BM Emokpae, Ejiroghene E, MD   237 mL at 04/13/21 1452   hydrocortisone sodium succinate (SOLU-CORTEF) 100 MG injection 75 mg  75 mg Intravenous Q12H TatShanon Brow, MD   75 mg at 04/14/21 0620   ipratropium-albuterol (DUONEB) 0.5-2.5 (3) MG/3ML nebulizer solution 3 mL  3 mL Nebulization Q4H PRN Emokpae, Ejiroghene E, MD   3 mL at 04/13/21 1949   MEDLINE mouth rinse  15 mL Mouth Rinse BID Emokpae, Ejiroghene E, MD   15 mL at 04/14/21 0805   phenylephrine (NEO-SYNEPHRINE) 20mg /NS 279mL premix infusion  25-200 mcg/min Intravenous Titrated Orson Eva, MD 18.75 mL/hr at 04/14/21 0822 25 mcg/min at 04/14/21 8315   prochlorperazine (COMPAZINE) injection 5 mg  5 mg Intravenous Q6H PRN Adefeso, Oladapo, DO       simvastatin (ZOCOR) tablet 40 mg  40 mg Oral q1800 Satira Sark, MD         Discharge Medications: Please see discharge summary for a list of discharge medications.  Relevant Imaging Results:  Relevant Lab Results:   Additional Information PT SSN [id number]  Natasha Bence, LCSW

## 2021-04-14 NOTE — Progress Notes (Signed)
Progress Note  Patient Name: [name] Date of Encounter: 04/14/2021  Primary Cardiologist: Rozann Lesches, MD  Subjective   In bedside chair this morning and on nasal cannula.  Stood with PT.  Inpatient Medications    Scheduled Meds:  Chlorhexidine Gluconate Cloth  6 each Topical Daily   feeding supplement  237 mL Oral BID BM   hydrocortisone sod succinate (SOLU-CORTEF) inj  75 mg Intravenous Q12H   mouth rinse  15 mL Mouth Rinse BID   Continuous Infusions:  sodium chloride 250 mL (04/13/21 0915)   azithromycin 250 mL/hr at 04/14/21 0822   ceFEPime (MAXIPIME) IV Stopped (04/14/21 0655)   phenylephrine (NEO-SYNEPHRINE) Adult infusion 25 mcg/min (04/14/21 0822)   PRN Meds: acetaminophen **OR** acetaminophen, ipratropium-albuterol, prochlorperazine   Vital Signs    Vitals:   04/14/21 0815 04/14/21 0830 04/14/21 0845 04/14/21 0900  BP: (!) 118/53 (!) 91/50 100/66 (!) 87/50  Pulse: 91 (!) 58 (!) 106 87  Resp: 19 10 18  (!) 23  Temp:      TempSrc:      SpO2: 94% 97% 96% 96%  Weight:      Height:        Intake/Output Summary (Last 24 hours) at 04/14/2021 0931 Last data filed at 04/14/2021 0900 Gross per 24 hour  Intake 2327.23 ml  Output 100 ml  Net 2227.23 ml   Filed Weights   04/12/21 1956 04/13/21 0457 04/14/21 0435  Weight: 71.4 kg 72.2 kg 76 kg    Telemetry    Sinus rhythm with PACs.  Personally reviewed.  ECG    No ECG reviewed.  Physical Exam   GEN: Elderly male, no distress.  Wearing protective mitts on his hands.   Neck: No JVD. Cardiac: RRR with ectopy, no gallop.  Respiratory: Scattered rhonchi without wheezing. GI: Soft, nontender, bowel sounds present. MS: No edema.  Labs    Chemistry Recent Labs  Lab 04/12/21 1453 04/12/21 2316 04/13/21 0142 04/14/21 0500  NA 137 137 137 136  K 3.7 4.0 4.0 5.1  CL 101 101 103 105  CO2 26 26 25  19*  GLUCOSE 209* 121* 109* 118*  BUN 24* 29* 31* 48*  CREATININE 1.53* 2.06* 2.12* 2.83*   CALCIUM 9.0 8.8* 8.5* 8.0*  PROT 6.4*  --   --  5.7*  ALBUMIN 3.7  --   --  3.0*  AST 42*  --   --  33  ALT 36  --   --  26  ALKPHOS 72  --   --  63  BILITOT 2.3*  --   --  0.8  GFRNONAA 42* 30* 29* 20*  ANIONGAP 10 10 9 12      Hematology Recent Labs  Lab 04/12/21 2316 04/13/21 0142 04/14/21 0500  WBC 11.0* 9.2 11.6*  RBC 4.31 4.23 4.30  HGB 12.9* 12.7* 12.9*  HCT 41.6 40.3 42.2  MCV 96.5 95.3 98.1  MCH 29.9 30.0 30.0  MCHC 31.0 31.5 30.6  RDW 14.0 14.0 14.2  PLT 86* 84* 79*    Cardiac Enzymes Recent Labs  Lab 04/12/21 2143 04/12/21 2316 04/13/21 0142 04/13/21 0418  TROPONINIHS 162* 184* 189* 221*    BNP Recent Labs  Lab 04/12/21 1453  BNP 1,568.0*     Radiology    NM Pulmonary Perfusion  Result Date: 04/13/2021 CLINICAL DATA:  Respiratory failure. Acute hypoxic respiratory failure. EXAM: NUCLEAR MEDICINE PERFUSION LUNG SCAN TECHNIQUE: Perfusion images were obtained in multiple projections after intravenous injection of radiopharmaceutical. Ventilation  scans intentionally deferred if perfusion scan and chest x-ray adequate for interpretation during COVID 19 epidemic. RADIOPHARMACEUTICALS:  4.2 mCi Tc-75m MAA IV COMPARISON:  Chest radiography same day FINDINGS: No evidence of segmental or subsegmental perfusion anomalies. Small amount of fluid seen in the fissures. Findings PICC low probability of pulmonary emboli. IMPRESSION: No segmental or subsegmental perfusion anomalies. Fissure signs which could be seen with small effusions/early congestive heart failure. Study predicts low probability of pulmonary emboli. Electronically Signed   By: Nelson Chimes M.D.   On: 04/13/2021 13:36   DG CHEST PORT 1 VIEW  Result Date: 04/13/2021 CLINICAL DATA:  Difficulty breathing. EXAM: PORTABLE CHEST 1 VIEW COMPARISON:  Portable chest yesterday at 3:09 p.m. FINDINGS: 4:47 a.m., 04/13/2021. There is mild-to-moderate cardiomegaly. Mild central vascular fullness continues to be  noted without appreciable edema. Sternotomy and CABG changes are redemonstrated as well as a single lead left chest cardiac assist device and wire insertion. There is increased streaky atelectasis or infiltrate in the retrocardiac left lower lobe and increased patchy opacities in the right lung base concerning for pneumonia with a small underlying right pleural effusion. Remaining lung fields remain clear. No other interval changes. Osteopenia. IMPRESSION: Increasing opacity in the left infrahilar retrocardiac lower lobe and right lung base concerning for pneumonia with worsening. Stable small right pleural effusion. Cardiomegaly. Electronically Signed   By: Telford Nab M.D.   On: 04/13/2021 05:49   DG Chest Port 1 View  Result Date: 04/12/2021 CLINICAL DATA:  SOB EXAM: PORTABLE CHEST 1 VIEW COMPARISON:  August 2022 FINDINGS: Implantable cardiac device with stable position of the lead. The heart appears within normal limits for size. Probable small right pleural effusion. No left pleural effusion. No pneumothorax. Heterogeneous right lower lung airspace opacities. Osteopenia with healing/chronic right rib fractures, present on previous exam. Median sternotomy wires. IMPRESSION: There are new right lower lung heterogeneous airspace opacities with small right pleural effusion. Electronically Signed   By: Albin Felling M.D.   On: 04/12/2021 15:17    Cardiac Studies   Echocardiogram 12/17/2020:  1. Poor acoustic windows.   2. Poor acoustic windows limit study Endocardium is not seen in all  segments even with DEFINITY There appears to be hypokinesis of the basal  inferior, basal inferoseptal, the inferolaterall and apical walls OVerall  LVEF appears mildly depressed LVEF 40  to 45%, again EF difficult given poor image quality. . The left  ventricular internal cavity size was severely dilated. There is mild left  ventricular hypertrophy. Left ventricular diastolic parameters are  indeterminate.   3.  Device lead seen in RV to distal apex. Right ventricular systolic  function is normal. The right ventricular size is normal. There is  moderately elevated pulmonary artery systolic pressure.   4. Left atrial size was severely dilated.   5. The mitral valve is normal in structure. Mild mitral valve  regurgitation.   6. The aortic valve is tricuspid. Aortic valve regurgitation is not  visualized. Mild to moderate aortic valve sclerosis/calcification is  present, without any evidence of aortic stenosis.   7. The inferior vena cava is dilated in size with <50% respiratory  variability, suggesting right atrial pressure of 15 mmHg.   Assessment & Plan    1.  Sepsis in the setting of pneumonia, procalcitonin 57.  Left infrahilar retrocardiac lower lobe and right lung infiltrates by chest x-ray as well as small pleural effusion.  Influenza A and COVID-19 negative.  Blood cultures negative so far.  Currently on  Solu-Cortef, azithromycin, and cefepime.  Has required phenylephrine for blood pressure support although weaning.  2.  Transient atrial fibrillation, appears to be sinus rhythm with frequent PACs recently.  Will follow-up ECG.  CHA2DS2-VASc score is 5 although do not plan anticoagulation at this point.  3.  HFrEF with chronic systolic heart failure.  LVEF 40 to 45% as of August.  He presented 10 pounds down from baseline in setting of chronic illness.  GDMT presently on hold including diuretics.  4.  CAD status post CABG in 2006.  5.  Acute kidney injury, creatinine up to 2.8.  Continue antibiotics and supportive care.  Can likely resume aspirin and Zocor.  Continue to hold Aldactone, Coreg, and losartan for now.  Signed, Rozann Lesches, MD  04/14/2021, 9:31 AM

## 2021-04-14 NOTE — Progress Notes (Addendum)
Physical Therapy Treatment Patient Details Name: [name] MRN: [id number] DOB: 04-22-1927 Today's Date: 04/14/2021   History of Present Illness [name] is a 87 y.o. male with medical history significant for cardiomyopathy, hypertension, systolic CHF, coronary artery disease.  Patient was brought to the ED via EMS with complaints of difficulty breathing that started yesterday evening, and worsened this morning.  Spouse is at bedside and assist with the history.  He has bilateral lower extremity swelling, spouse and wife are not sure how long she has had this. Not on Lasix.  Spouse also reports patient has had a very bad cough, productive of cream colored phlegm.  EMS reports patient's O2 sats was 60% on room air.  He was placed on 15 L nonrebreather with sats improving to 96%    PT Comments    Patient presents in bed awake, alert, and agreeable for therapy on 4 LPM of O2 w/ SpO2 of 96%. Patient continues to perform bed mobility w/ slow labored movement, requires fewer rest breaks and decreased SOB. Patient demonstrates a good return for completion of his seated exercise program at EOB, c/o dizziness shortly after starting his exercises, requires frequent breaks due to dizziness, cuing provided for patient to look straight ahead, BP taken twice seated at EOB during exercises. Orthostatics as follows: sitting 84/46, sitting 108/67, standing 109/62. Patient able to perform sit to stand and ambulate to chair at bedside using RW, requires Min assist due to dizziness, generalized weakness, and fatigue, SpO2 averages 93% during activity while on 4 LPM O2. Patient tolerated sitting up in chair after therapy, SpO2 averaging 93%. Patient will benefit from continued skilled physical therapy in hospital and recommended venue below to increase strength, balance, endurance for safe ADLs and gait.   Recommendations for follow up therapy are one component of a multi-disciplinary discharge planning process,  led by the attending physician.  Recommendations may be updated based on patient status, additional functional criteria and insurance authorization.  Follow Up Recommendations  Skilled nursing-short term rehab (<3 hours/day)     Assistance Recommended at Discharge Frequent or constant Supervision/Assistance  Equipment Recommendations  None recommended by PT    Recommendations for Other Services       Precautions / Restrictions Precautions Precautions: Fall Restrictions Weight Bearing Restrictions: No     Mobility  Bed Mobility Overal bed mobility: Needs Assistance Bed Mobility: Rolling;Sidelying to Sit Rolling: Min guard;Min assist Sidelying to sit: Min guard       General bed mobility comments: Slow labored movement, requires fewer rest breaks, continues to demonstrate SOB    Transfers Overall transfer level: Needs assistance Equipment used: Rolling walker (2 wheels) Transfers: Sit to/from Stand;Bed to chair/wheelchair/BSC Sit to Stand: Min assist     Step pivot transfers: Min assist     General transfer comment: Performed sit to stand and transfers using AD due to dizziness and weakness, demonstrates steadiness on feet, c/o dizziness    Ambulation/Gait Ambulation/Gait assistance: Min assist Gait Distance (Feet): 3 Feet Assistive device: Rolling walker (2 wheels) Gait Pattern/deviations: Decreased step length - right;Decreased step length - left;Decreased stride length Gait velocity: Decreased     General Gait Details: Slow labored movement, slow cadence, steady on feet using RW, c/o dizziness   Stairs             Wheelchair Mobility    Modified Rankin (Stroke Patients Only)       Balance Overall balance assessment: Needs assistance Sitting-balance support: Bilateral upper extremity supported;Feet  supported   Sitting balance - Comments: fair/good seated at EOB   Standing balance support: Bilateral upper extremity supported;During  functional activity;Reliant on assistive device for balance Standing balance-Leahy Scale: Fair Standing balance comment: using RW                            Cognition Arousal/Alertness: Awake/alert Behavior During Therapy: WFL for tasks assessed/performed Overall Cognitive Status: Within Functional Limits for tasks assessed                                          Exercises General Exercises - Lower Extremity Long Arc Quad: Seated;AROM;Strengthening;Both;10 reps Hip Flexion/Marching: Seated;AROM;Strengthening;Both;5 reps Toe Raises: Seated;AROM;Strengthening;Both;10 reps Heel Raises: Seated;AROM;Strengthening;Both;5 reps    General Comments        Pertinent Vitals/Pain Pain Assessment: No/denies pain    Home Living                          Prior Function            PT Goals (current goals can now be found in the care plan section) Acute Rehab PT Goals Patient Stated Goal: Return home after rehab. PT Goal Formulation: With patient/family Time For Goal Achievement: 04/27/21 Potential to Achieve Goals: Good Progress towards PT goals: Progressing toward goals    Frequency    Min 3X/week      PT Plan Current plan remains appropriate    Co-evaluation              AM-PAC PT "6 Clicks" Mobility   Outcome Measure  Help needed turning from your back to your side while in a flat bed without using bedrails?: A Little Help needed moving from lying on your back to sitting on the side of a flat bed without using bedrails?: A Lot Help needed moving to and from a bed to a chair (including a wheelchair)?: A Little Help needed standing up from a chair using your arms (e.g., wheelchair or bedside chair)?: A Little Help needed to walk in hospital room?: A Little Help needed climbing 3-5 steps with a railing? : A Lot 6 Click Score: 16    End of Session Equipment Utilized During Treatment: Oxygen Activity Tolerance: Patient  tolerated treatment well;Patient limited by fatigue;Other (comment) (limitd by dizziness and fatigue) Patient left: in chair;with call bell/phone within reach;with chair alarm set Nurse Communication: Mobility status PT Visit Diagnosis: Unsteadiness on feet (R26.81);Other abnormalities of gait and mobility (R26.89);Muscle weakness (generalized) (M62.81)     Time: 9702-6378 PT Time Calculation (min) (ACUTE ONLY): 37 min  Charges:  $Therapeutic Exercise: 8-22 mins $Therapeutic Activity: 23-37 mins                     Cassie Jones, SPT  During this treatment session, the therapist was present, participating in and directing the treatment.  12:16 PM, 04/14/21 Lonell Grandchild, MPT Physical Therapist with [facility] 336 [phone] office [phone] mobile phone

## 2021-04-15 DIAGNOSIS — Z95810 Presence of automatic (implantable) cardiac defibrillator: Secondary | ICD-10-CM | POA: Diagnosis not present

## 2021-04-15 DIAGNOSIS — J181 Lobar pneumonia, unspecified organism: Secondary | ICD-10-CM | POA: Diagnosis not present

## 2021-04-15 DIAGNOSIS — J9601 Acute respiratory failure with hypoxia: Secondary | ICD-10-CM | POA: Diagnosis not present

## 2021-04-15 DIAGNOSIS — I10 Essential (primary) hypertension: Secondary | ICD-10-CM | POA: Diagnosis not present

## 2021-04-15 LAB — CBC
HCT: 40.5 % (ref 39.0–52.0)
Hemoglobin: 12.4 g/dL — ABNORMAL LOW (ref 13.0–17.0)
MCH: 29.7 pg (ref 26.0–34.0)
MCHC: 30.6 g/dL (ref 30.0–36.0)
MCV: 97.1 fL (ref 80.0–100.0)
Platelets: 64 10*3/uL — ABNORMAL LOW (ref 150–400)
RBC: 4.17 MIL/uL — ABNORMAL LOW (ref 4.22–5.81)
RDW: 14 % (ref 11.5–15.5)
WBC: 6 10*3/uL (ref 4.0–10.5)
nRBC: 0.3 % — ABNORMAL HIGH (ref 0.0–0.2)

## 2021-04-15 LAB — BASIC METABOLIC PANEL
Anion gap: 8 (ref 5–15)
BUN: 61 mg/dL — ABNORMAL HIGH (ref 8–23)
CO2: 24 mmol/L (ref 22–32)
Calcium: 8.2 mg/dL — ABNORMAL LOW (ref 8.9–10.3)
Chloride: 106 mmol/L (ref 98–111)
Creatinine, Ser: 2.97 mg/dL — ABNORMAL HIGH (ref 0.61–1.24)
GFR, Estimated: 19 mL/min — ABNORMAL LOW (ref >60)
Glucose, Bld: 168 mg/dL — ABNORMAL HIGH (ref 70–99)
Potassium: 4.5 mmol/L (ref 3.5–5.1)
Sodium: 138 mmol/L (ref 135–145)

## 2021-04-15 LAB — GLUCOSE, CAPILLARY
Glucose-Capillary: 146 mg/dL — ABNORMAL HIGH (ref 70–99)
Glucose-Capillary: 155 mg/dL — ABNORMAL HIGH (ref 70–99)

## 2021-04-15 MED ORDER — LOPERAMIDE HCL 2 MG PO CAPS
2.0000 mg | ORAL_CAPSULE | ORAL | Status: DC | PRN
  Administered 2021-04-15: 2 mg via ORAL
  Filled 2021-04-15: qty 1

## 2021-04-15 MED ORDER — AZITHROMYCIN 250 MG PO TABS
500.0000 mg | ORAL_TABLET | Freq: Every day | ORAL | Status: DC
  Administered 2021-04-16 – 2021-04-18 (×3): 500 mg via ORAL
  Filled 2021-04-15 (×3): qty 2

## 2021-04-15 MED ORDER — PREDNISONE 20 MG PO TABS
50.0000 mg | ORAL_TABLET | Freq: Every day | ORAL | Status: DC
  Administered 2021-04-16: 09:00:00 50 mg via ORAL
  Filled 2021-04-15: qty 1

## 2021-04-15 NOTE — Progress Notes (Signed)
Report called to [employer]. All questions answered. Patient taken to 53 with all personal belongings accounted for.

## 2021-04-15 NOTE — Progress Notes (Signed)
PROGRESS NOTE  [name] XIP:[id number] DOB: [birth year] DOA: 04/12/2021 PCP: [name], MD  Brief History:  87 year old male with a history of coronary artery disease status post CABG, systolic CHF with EF 67-34%, AICD, prostate cancer, MDS, hypertension, hyperlipidemia presenting with shortness of breath of 1 week duration.  History is supplemented by the patient's wife.  Apparently his shortness of breath continue to worsen over the past 2 to 3 days prior to admission.  He had a cough with cream-colored sputum.  There is no hemoptysis, fevers, chills, chest pain, nausea, vomiting, diarrhea, abdominal pain.  The patient has bilateral lower extremity edema which appears to be chronic.  Spouse states that his edema is actually a little bit better than usual.  Upon EMS activation.  The patient was noted to have oxygen saturation of 60% on room air.  He was placed on nonrebreather.  In the emergency department, patient was initially afebrile and hemodynamically stable.  He was placed on 6 L with oxygen saturation in the low 90s.  Chest x-ray showed bibasilar opacities, regular in the left.  BMP showed sodium sodium 137, potassium 3.7, bicarbonate 26, serum creatinine 1.53.  WBC 5.9, hemoglobin 13.8, platelets 86,000.   Notably, the patient had a hospital admission from 12/17/2020 to 12/20/2020 for chest pain concerning for unstable angina.  He was started on IV heparin.  He underwent cardiac catheterization.  . Repeat catheterization showed 3/3 patent grafts and the likely culprit lesion was 80% stenosis in the distal LPAV prior to bifurcation into LPL 1 and LPL 2 which was filled from retrograde flow from the SVG to OM 2 and was not approachable for PCI and medical management was recommended. Coreg was increased to 12.5 mg twice daily and Losartan increased to 50 mg daily. He was continued on ASA 81 mg daily and Crestor 20 mg daily with consideration of adding Spironolactone or an SGLT2  inhibitor as an outpatient if renal function remained stable (was at 1.05 on the day of discharge).  He was admitted to the ICU.  He continued to have hypoxia and was placed on BiPAP.  He subsequently developed hypotension requiring fluid resuscitation.  UA was negative for pyuria.   Assessment/Plan: Sepsis -Present on admission -Presented with tachypnea, fever, and respiratory failure -Secondary to pneumonia -PCT 43.33>>57.55 -Follow blood cultures--neg to date -UA negative for pyuria -remains of neosynephrine 25 mcg -continue solucortef>>wean off   Lobar pneumonia -Personally reviewed chest x-ray--right greater than left basilar opacity -Continue cefepime and azithromycin -MRSA screen neg -PCT 43>>57.55   Acute respiratory failure with hypoxia and hypercarbia -Secondary to pneumonia -Currently on BiPAP FiO2 0.5 -Wean back to room air as tolerated for saturation greater 90% -weaned to 1P>>3X   Chronic systolic CHF/ischemic cardiomyopathy -12/17/2020 echo EF 40 to 45%, +WMA -Holding carvedilol, losartan, spironolactone secondary to hypotension -Holding furosemide temporarily   Paroxysmal Afib -transient -now in sinus with PACs -do not plan AC currently unless recurrent   Essential hypertension -Holding losartan, carvedilol, spironolactone secondary to hypotension   Coronary artery disease -No chest pain presently -Continue aspirin -Holding carvedilol, spironolactone, losartan secondary to hypotension -resume ASA and statin   Acute on chronic renal failure--CKD stage IIIa -Baseline creatinine 1.1-1.3 -In part due to hypotension and furosemide -serum creatinine peaking 2.83>>2.97 -renal US--no hydronephrosis   Hyperlipidemia -Continue statin   Tobacco abuse in remission -50 pk yr -quit 20 years ago       Status  is: Inpatient   Remains inpatient appropriate because: hemodynamic instability, severity of illness requiring BiPAP and IV abx                Family Communication:   son updated at bedside12/3   Consultants:  cardiology   Code Status:  FULL    DVT Prophylaxis:  [place] Heparin      Procedures: As Listed in Progress Note Above   Antibiotics: Cefepime 12/1>> Azithro 12/1>>     Subjective: Patient denies fevers, chills, headache, chest pain, dyspnea, nausea, vomiting, diarrhea, abdominal pain, dysuria, hematuria, hematochezia, and melena.   Objective: Vitals:   04/15/21 1100 04/15/21 1105 04/15/21 1200 04/15/21 1300  BP: 136/70  (!) 152/73 (!) 149/75  Pulse: 91 90 87 (!) 122  Resp: 18 (!) 23 19 18   Temp:  97.6 F (36.4 C)    TempSrc:  Oral    SpO2: 98% 98% 95% 96%  Weight:      Height:        Intake/Output Summary (Last 24 hours) at 04/15/2021 1339 Last data filed at 04/15/2021 0600 Gross per 24 hour  Intake 671.92 ml  Output 600 ml  Net 71.92 ml   Weight change: -0.9 kg Exam:  General:  Pt is alert, follows commands appropriately, not in acute distress HEENT: No icterus, No thrush, No neck mass, [place]/AT Cardiovascular: RRR, S1/S2, no rubs, no gallops Respiratory: bibasilar rales. No wheeze Abdomen: Soft/+BS, non tender, non distended, no guarding Extremities: 1 +LE edema, No lymphangitis, No petechiae, No rashes, no synovitis   Data Reviewed: I have personally reviewed following labs and imaging studies Basic Metabolic Panel: Recent Labs  Lab 04/12/21 1453 04/12/21 2316 04/13/21 0142 04/14/21 0500 04/15/21 0528  NA 137 137 137 136 138  K 3.7 4.0 4.0 5.1 4.5  CL 101 101 103 105 106  CO2 26 26 25  19* 24  GLUCOSE 209* 121* 109* 118* 168*  BUN 24* 29* 31* 48* 61*  CREATININE 1.53* 2.06* 2.12* 2.83* 2.97*  CALCIUM 9.0 8.8* 8.5* 8.0* 8.2*  MG  --   --  1.4*  --   --   PHOS  --   --  3.4  --   --    Liver Function Tests: Recent Labs  Lab 04/12/21 1453 04/14/21 0500  AST 42* 33  ALT 36 26  ALKPHOS 72 63  BILITOT 2.3* 0.8  PROT 6.4* 5.7*  ALBUMIN 3.7 3.0*   No results for input(s):  LIPASE, AMYLASE in the last 168 hours. No results for input(s): AMMONIA in the last 168 hours. Coagulation Profile: No results for input(s): INR, PROTIME in the last 168 hours. CBC: Recent Labs  Lab 04/12/21 1453 04/12/21 2316 04/13/21 0142 04/14/21 0500 04/15/21 0528  WBC 5.9 11.0* 9.2 11.6* 6.0  NEUTROABS 2.5  --   --  5.1  --   HGB 13.8 12.9* 12.7* 12.9* 12.4*  HCT 42.8 41.6 40.3 42.2 40.5  MCV 94.7 96.5 95.3 98.1 97.1  PLT 86* 86* 84* 79* 64*   Cardiac Enzymes: No results for input(s): CKTOTAL, CKMB, CKMBINDEX, TROPONINI in the last 168 hours. BNP: Invalid input(s): POCBNP CBG: Recent Labs  Lab 04/14/21 1126 04/14/21 1633 04/14/21 2035 04/15/21 0744 04/15/21 1107  GLUCAP 106* 145* 138* 146* 155*   HbA1C: No results for input(s): HGBA1C in the last 72 hours. Urine analysis:    Component Value Date/Time   COLORURINE YELLOW 04/13/2021 [place] 04/13/2021 0348   LABSPEC 1.025 04/13/2021 0348  PHURINE 5.0 04/13/2021 0348   GLUCOSEU NEGATIVE 04/13/2021 0348   HGBUR SMALL (A) 04/13/2021 0348   BILIRUBINUR NEGATIVE 04/13/2021 0348   KETONESUR NEGATIVE 04/13/2021 0348   PROTEINUR 100 (A) 04/13/2021 0348   NITRITE NEGATIVE 04/13/2021 0348   LEUKOCYTESUR SMALL (A) 04/13/2021 0348   Sepsis Labs: @LABRCNTIP (procalcitonin:4,lacticidven:4) ) Recent Results (from the past 240 hour(s))  Resp Panel by RT-PCR (Flu A&B, Covid) Nasopharyngeal Swab     Status: None   Collection Time: 04/12/21  2:59 PM   Specimen: Nasopharyngeal Swab; Nasopharyngeal(NP) swabs in vial transport medium  Result Value Ref Range Status   SARS Coronavirus 2 by RT PCR NEGATIVE NEGATIVE Final    Comment: (NOTE) SARS-CoV-2 target nucleic acids are NOT DETECTED.  The SARS-CoV-2 RNA is generally detectable in upper respiratory specimens during the acute phase of infection. The lowest concentration of SARS-CoV-2 viral copies this assay can detect is 138 copies/mL. A negative result  does not preclude SARS-Cov-2 infection and should not be used as the sole basis for treatment or other patient management decisions. A negative result may occur with  improper specimen collection/handling, submission of specimen other than nasopharyngeal swab, presence of viral mutation(s) within the areas targeted by this assay, and inadequate number of viral copies(<138 copies/mL). A negative result must be combined with clinical observations, patient history, and epidemiological information. The expected result is Negative.  Fact Sheet for Patients:  EntrepreneurPulse.com.au  Fact Sheet for Healthcare Providers:  IncredibleEmployment.be  This test is no t yet approved or cleared by the [place] FDA and  has been authorized for detection and/or diagnosis of SARS-CoV-2 by FDA under an Emergency Use Authorization (EUA). This EUA will remain  in effect (meaning this test can be used) for the duration of the COVID-19 declaration under Section 564(b)(1) of the Act, 21 U.S.C.section 360bbb-3(b)(1), unless the authorization is terminated  or revoked sooner.       Influenza A by PCR NEGATIVE NEGATIVE Final   Influenza B by PCR NEGATIVE NEGATIVE Final    Comment: (NOTE) The Xpert Xpress SARS-CoV-2/FLU/RSV plus assay is intended as an aid in the diagnosis of influenza from Nasopharyngeal swab specimens and should not be used as a sole basis for treatment. Nasal washings and aspirates are unacceptable for Xpert Xpress SARS-CoV-2/FLU/RSV testing.  Fact Sheet for Patients: EntrepreneurPulse.com.au  Fact Sheet for Healthcare Providers: IncredibleEmployment.be  This test is not yet approved or cleared by the [place] FDA and has been authorized for detection and/or diagnosis of SARS-CoV-2 by FDA under an Emergency Use Authorization (EUA). This EUA will remain in effect (meaning this test can be used) for  the duration of the COVID-19 declaration under Section 564(b)(1) of the Act, 21 U.S.C. section 360bbb-3(b)(1), unless the authorization is terminated or revoked.  Performed at [facility], [address] [place], [place] [address]   MRSA Next Gen by PCR, Nasal     Status: None   Collection Time: 04/12/21  7:50 PM   Specimen: Nasal Mucosa; Nasal Swab  Result Value Ref Range Status   MRSA by PCR Next Gen NOT DETECTED NOT DETECTED Final    Comment: (NOTE) The GeneXpert MRSA Assay (FDA approved for NASAL specimens only), is one component of a comprehensive MRSA colonization surveillance program. It is not intended to diagnose MRSA infection nor to guide or monitor treatment for MRSA infections. Test performance is not FDA approved in patients less than 67 years old. Performed at [facility], [address], [place], [place] [address]  Urine Culture     Status: Abnormal   Collection Time: 04/13/21  3:48 AM   Specimen: Urine, Clean Catch  Result Value Ref Range Status   Specimen Description   Final    URINE, CLEAN CATCH Performed at [facility], [address] [place], [place] [address]    Special Requests   Final    NONE Performed at [facility], [address] [place], [place] [address]    Culture MULTIPLE SPECIES PRESENT, SUGGEST RECOLLECTION (A)  Final   Report Status 04/14/2021 FINAL  Final  Culture, blood (Routine X 2) w Reflex to ID Panel     Status: None (Preliminary result)   Collection Time: 04/13/21  4:18 AM   Specimen: Right Antecubital; Blood  Result Value Ref Range Status   Specimen Description RIGHT ANTECUBITAL  Final   Special Requests   Final    BOTTLES DRAWN AEROBIC AND ANAEROBIC Blood Culture adequate volume   Culture   Final    NO GROWTH 2 DAYS Performed at [facility], [address], [place], [place] [address]    Report Status PENDING  Incomplete  Culture, blood (Routine X 2) w Reflex to ID Panel     Status: None (Preliminary result)   Collection  Time: 04/13/21  4:18 AM   Specimen: BLOOD RIGHT HAND  Result Value Ref Range Status   Specimen Description BLOOD RIGHT HAND  Final   Special Requests   Final    BOTTLES DRAWN AEROBIC AND ANAEROBIC Blood Culture adequate volume   Culture   Final    NO GROWTH 2 DAYS Performed at [facility], [address], [place], [place] [address]    Report Status PENDING  Incomplete     Scheduled Meds:  aspirin EC  81 mg Oral Daily   Chlorhexidine Gluconate Cloth  6 each Topical Daily   feeding supplement  237 mL Oral BID BM   hydrocortisone sod succinate (SOLU-CORTEF) inj  75 mg Intravenous Q12H   mouth rinse  15 mL Mouth Rinse BID   simvastatin  40 mg Oral q1800   Continuous Infusions:  sodium chloride 250 mL (04/13/21 0915)   azithromycin 500 mg (04/15/21 0647)   ceFEPime (MAXIPIME) IV 2 g (04/15/21 0612)   phenylephrine (NEO-SYNEPHRINE) Adult infusion Stopped (04/14/21 1540)    Procedures/Studies: NM Pulmonary Perfusion  Result Date: 04/13/2021 CLINICAL DATA:  Respiratory failure. Acute hypoxic respiratory failure. EXAM: NUCLEAR MEDICINE PERFUSION LUNG SCAN TECHNIQUE: Perfusion images were obtained in multiple projections after intravenous injection of radiopharmaceutical. Ventilation scans intentionally deferred if perfusion scan and chest x-ray adequate for interpretation during COVID 19 epidemic. RADIOPHARMACEUTICALS:  4.2 mCi Tc-71m MAA IV COMPARISON:  Chest radiography same day FINDINGS: No evidence of segmental or subsegmental perfusion anomalies. Small amount of fluid seen in the fissures. Findings PICC low probability of pulmonary emboli. IMPRESSION: No segmental or subsegmental perfusion anomalies. Fissure signs which could be seen with small effusions/early congestive heart failure. Study predicts low probability of pulmonary emboli. Electronically Signed   By: Nelson Chimes M.D.   On: 04/13/2021 13:36   US RENAL  Result Date: 04/14/2021 CLINICAL DATA:  Renal dysfunction EXAM: RENAL  / URINARY TRACT ULTRASOUND COMPLETE COMPARISON:  CT done on 11/04/2020 FINDINGS: Right Kidney: Renal measurements: 9.8 x 5.3 x 3.9 cm = volume: 105.1 mL. There is no hydronephrosis. There is interval resolution of right hydronephrosis since the CT done on 11/04/2020. There are multiple smooth marginated anechoic lesions in the right kidney largest measuring 4.9 cm  in the lower pole. Left Kidney: Renal measurements: 10.6 x 4.6 x 5.6 cm = volume: 141.4 mL. There is mild lobulation in the margin. Technologist observed 7 mm hyperechoic structure in the upper pole which was not distinctly seen in the previous CT and may be a partial volume averaging artifact. There is possible 10 mm cyst in the lower pole. Left adrenal is not visualized in the submitted images. Bladder: Urinary bladder is not distended. Other: None. IMPRESSION: There is no hydronephrosis.  Bilateral renal cysts. Electronically Signed   By: Elmer Picker M.D.   On: 04/14/2021 16:16   DG CHEST PORT 1 VIEW  Result Date: 04/13/2021 CLINICAL DATA:  Difficulty breathing. EXAM: PORTABLE CHEST 1 VIEW COMPARISON:  Portable chest yesterday at 3:09 p.m. FINDINGS: 4:47 a.m., 04/13/2021. There is mild-to-moderate cardiomegaly. Mild central vascular fullness continues to be noted without appreciable edema. Sternotomy and CABG changes are redemonstrated as well as a single lead left chest cardiac assist device and wire insertion. There is increased streaky atelectasis or infiltrate in the retrocardiac left lower lobe and increased patchy opacities in the right lung base concerning for pneumonia with a small underlying right pleural effusion. Remaining lung fields remain clear. No other interval changes. Osteopenia. IMPRESSION: Increasing opacity in the left infrahilar retrocardiac lower lobe and right lung base concerning for pneumonia with worsening. Stable small right pleural effusion. Cardiomegaly. Electronically Signed   By: Telford Nab M.D.   On:  04/13/2021 05:49   DG Chest Port 1 View  Result Date: 04/12/2021 CLINICAL DATA:  SOB EXAM: PORTABLE CHEST 1 VIEW COMPARISON:  August 2022 FINDINGS: Implantable cardiac device with stable position of the lead. The heart appears within normal limits for size. Probable small right pleural effusion. No left pleural effusion. No pneumothorax. Heterogeneous right lower lung airspace opacities. Osteopenia with healing/chronic right rib fractures, present on previous exam. Median sternotomy wires. IMPRESSION: There are new right lower lung heterogeneous airspace opacities with small right pleural effusion. Electronically Signed   By: Albin Felling M.D.   On: 04/12/2021 15:17    Orson Eva, DO  [facility]  If 7PM-7AM, please contact night-coverage [email or web address] Password [email or web address] 04/15/2021, 1:39 PM   LOS: 3 days

## 2021-04-15 NOTE — Progress Notes (Signed)
Tele called patient had a 6 second strip of heart rate in the 140s, then heart rate returned to the 90s. MD Tat made aware.

## 2021-04-16 DIAGNOSIS — J181 Lobar pneumonia, unspecified organism: Secondary | ICD-10-CM | POA: Diagnosis not present

## 2021-04-16 DIAGNOSIS — Z95810 Presence of automatic (implantable) cardiac defibrillator: Secondary | ICD-10-CM | POA: Diagnosis not present

## 2021-04-16 DIAGNOSIS — I5022 Chronic systolic (congestive) heart failure: Secondary | ICD-10-CM | POA: Diagnosis not present

## 2021-04-16 DIAGNOSIS — J9601 Acute respiratory failure with hypoxia: Secondary | ICD-10-CM | POA: Diagnosis not present

## 2021-04-16 LAB — CBC
HCT: 38.9 % — ABNORMAL LOW (ref 39.0–52.0)
Hemoglobin: 12.1 g/dL — ABNORMAL LOW (ref 13.0–17.0)
MCH: 29.3 pg (ref 26.0–34.0)
MCHC: 31.1 g/dL (ref 30.0–36.0)
MCV: 94.2 fL (ref 80.0–100.0)
Platelets: 74 10*3/uL — ABNORMAL LOW (ref 150–400)
RBC: 4.13 MIL/uL — ABNORMAL LOW (ref 4.22–5.81)
RDW: 13.9 % (ref 11.5–15.5)
WBC: 6.3 10*3/uL (ref 4.0–10.5)
nRBC: 0.8 % — ABNORMAL HIGH (ref 0.0–0.2)

## 2021-04-16 LAB — BASIC METABOLIC PANEL
Anion gap: 9 (ref 5–15)
BUN: 68 mg/dL — ABNORMAL HIGH (ref 8–23)
CO2: 24 mmol/L (ref 22–32)
Calcium: 8.1 mg/dL — ABNORMAL LOW (ref 8.9–10.3)
Chloride: 107 mmol/L (ref 98–111)
Creatinine, Ser: 2.54 mg/dL — ABNORMAL HIGH (ref 0.61–1.24)
GFR, Estimated: 23 mL/min — ABNORMAL LOW (ref >60)
Glucose, Bld: 126 mg/dL — ABNORMAL HIGH (ref 70–99)
Potassium: 3.9 mmol/L (ref 3.5–5.1)
Sodium: 140 mmol/L (ref 135–145)

## 2021-04-16 LAB — MAGNESIUM: Magnesium: 2.3 mg/dL (ref 1.7–2.4)

## 2021-04-16 LAB — GLUCOSE, CAPILLARY
Glucose-Capillary: 100 mg/dL — ABNORMAL HIGH (ref 70–99)
Glucose-Capillary: 138 mg/dL — ABNORMAL HIGH (ref 70–99)
Glucose-Capillary: 240 mg/dL — ABNORMAL HIGH (ref 70–99)
Glucose-Capillary: 205 mg/dL — ABNORMAL HIGH (ref 70–99)

## 2021-04-16 MED ORDER — GUAIFENESIN-DM 100-10 MG/5ML PO SYRP
5.0000 mL | ORAL_SOLUTION | ORAL | Status: DC | PRN
  Administered 2021-04-16 – 2021-04-20 (×2): 5 mL via ORAL
  Filled 2021-04-16 (×2): qty 5

## 2021-04-16 MED ORDER — IPRATROPIUM-ALBUTEROL 0.5-2.5 (3) MG/3ML IN SOLN
3.0000 mL | Freq: Two times a day (BID) | RESPIRATORY_TRACT | Status: DC
  Administered 2021-04-17: 3 mL via RESPIRATORY_TRACT
  Filled 2021-04-16 (×4): qty 3

## 2021-04-16 MED ORDER — PREDNISONE 20 MG PO TABS
40.0000 mg | ORAL_TABLET | Freq: Every day | ORAL | Status: DC
  Administered 2021-04-17: 40 mg via ORAL
  Filled 2021-04-16: qty 2

## 2021-04-16 NOTE — Progress Notes (Signed)
PROGRESS NOTE  [name] GUR:[id number] DOB: 09-May-1927 DOA: 04/12/2021 PCP: [name], MD   Brief History:  87 year old male with a history of coronary artery disease status post CABG, systolic CHF with EF 28-31%, AICD, prostate cancer, MDS, hypertension, hyperlipidemia presenting with shortness of breath of 1 week duration.  History is supplemented by the patient's wife.  Apparently his shortness of breath continue to worsen over the past 2 to 3 days prior to admission.  He had a cough with cream-colored sputum.  There is no hemoptysis, fevers, chills, chest pain, nausea, vomiting, diarrhea, abdominal pain.  The patient has bilateral lower extremity edema which appears to be chronic.  Spouse states that his edema is actually a little bit better than usual.  Upon EMS activation.  The patient was noted to have oxygen saturation of 60% on room air.  He was placed on nonrebreather.  In the emergency department, patient was initially afebrile and hemodynamically stable.  He was placed on 6 L with oxygen saturation in the low 90s.  Chest x-ray showed bibasilar opacities, regular in the left.  BMP showed sodium sodium 137, potassium 3.7, bicarbonate 26, serum creatinine 1.53.  WBC 5.9, hemoglobin 13.8, platelets 86,000.   Notably, the patient had a hospital admission from 12/17/2020 to 12/20/2020 for chest pain concerning for unstable angina.  He was started on IV heparin.  He underwent cardiac catheterization.  . Repeat catheterization showed 3/3 patent grafts and the likely culprit lesion was 80% stenosis in the distal LPAV prior to bifurcation into LPL 1 and LPL 2 which was filled from retrograde flow from the SVG to OM 2 and was not approachable for PCI and medical management was recommended. Coreg was increased to 12.5 mg twice daily and Losartan increased to 50 mg daily. He was continued on ASA 81 mg daily and Crestor 20 mg daily with consideration of adding Spironolactone or an SGLT2  inhibitor as an outpatient if renal function remained stable (was at 1.05 on the day of discharge).  He was admitted to the ICU.  He continued to have hypoxia and was placed on BiPAP.  He subsequently developed hypotension requiring fluid resuscitation and vasopressor support.  UA was negative for pyuria.   Assessment/Plan: Sepsis -Present on admission -Presented with tachypnea, fever, and respiratory failure -Secondary to pneumonia -PCT 43.33>>57.55 -Follow blood cultures--neg to date -UA negative for pyuria -remains of neosynephrine 25 mcg>>weaned off 04/14/21 -continue solucortef>>wean off   Lobar pneumonia -Personally reviewed chest x-ray--right greater than left basilar opacity -Continue cefepime and azithromycin -MRSA screen neg -PCT 43>>57.55   Acute respiratory failure with hypoxia and hypercarbia -Secondary to pneumonia -Currently on BiPAP FiO2 0.5 -Wean back to room air as tolerated for saturation greater 90% -weaned to 5V>>7.6H   Chronic systolic CHF/ischemic cardiomyopathy -12/17/2020 echo EF 40 to 45%, +WMA -Holding carvedilol, losartan, spironolactone secondary to hypotension -Holding furosemide temporarily   Paroxysmal Afib -transient -now in sinus with PACs -do not plan AC currently unless recurrent   Essential hypertension -Holding losartan, carvedilol, spironolactone secondary to hypotension   Coronary artery disease -No chest pain presently -Continue aspirin -Holding carvedilol, spironolactone, losartan secondary to hypotension -resume ASA and statin   Acute on chronic renal failure--CKD stage IIIa -Baseline creatinine 1.1-1.3 -In part due to hypotension and furosemide -serum creatinine peaking 2.83>>2.97>>2.54 -renal US--no hydronephrosis   Hyperlipidemia -Continue statin   Tobacco abuse in remission -50 pk yr -quit 20 years ago  Status is: Inpatient   Remains inpatient appropriate because: hemodynamic instability, severity of  illness requiring BiPAP and IV abx               Family Communication:   spouse updated at bedside12/4   Consultants:  cardiology   Code Status:  FULL    DVT Prophylaxis:  Carbon Heparin      Procedures: As Listed in Progress Note Above   Antibiotics: Cefepime 12/1>> Azithro 12/1>>     Subjective: Patient denies fevers, chills, headache, chest pain, dyspnea, nausea, vomiting, diarrhea, abdominal pain, dysuria, hematuria, hematochezia, and melena.   Objective: Vitals:   04/16/21 0004 04/16/21 0337 04/16/21 0600 04/16/21 0658  BP: 118/81 120/62    Pulse: 61 86    Resp: 16 16    Temp: 97.9 F (36.6 C) 98 F (36.7 C)    TempSrc:      SpO2: 95% 96%  91%  Weight:   75.9 kg   Height:        Intake/Output Summary (Last 24 hours) at 04/16/2021 1353 Last data filed at 04/16/2021 8469 Gross per 24 hour  Intake 410 ml  Output 200 ml  Net 210 ml   Weight change: 0.8 kg Exam:  General:  Pt is alert, follows commands appropriately, not in acute distress HEENT: No icterus, No thrush, No neck mass, [place]/AT Cardiovascular: RRR, S1/S2, no rubs, no gallops Respiratory: bibasilar rales, R>L. No wheeze Abdomen: Soft/+BS, non tender, non distended, no guarding Extremities: trace LE edema, No lymphangitis, No petechiae, No rashes, no synovitis   Data Reviewed: I have personally reviewed following labs and imaging studies Basic Metabolic Panel: Recent Labs  Lab 04/12/21 2316 04/13/21 0142 04/14/21 0500 04/15/21 0528 04/16/21 0543  NA 137 137 136 138 140  K 4.0 4.0 5.1 4.5 3.9  CL 101 103 105 106 107  CO2 26 25 19* 24 24  GLUCOSE 121* 109* 118* 168* 126*  BUN 29* 31* 48* 61* 68*  CREATININE 2.06* 2.12* 2.83* 2.97* 2.54*  CALCIUM 8.8* 8.5* 8.0* 8.2* 8.1*  MG  --  1.4*  --   --  2.3  PHOS  --  3.4  --   --   --    Liver Function Tests: Recent Labs  Lab 04/12/21 1453 04/14/21 0500  AST 42* 33  ALT 36 26  ALKPHOS 72 63  BILITOT 2.3* 0.8  PROT 6.4* 5.7*  ALBUMIN  3.7 3.0*   No results for input(s): LIPASE, AMYLASE in the last 168 hours. No results for input(s): AMMONIA in the last 168 hours. Coagulation Profile: No results for input(s): INR, PROTIME in the last 168 hours. CBC: Recent Labs  Lab 04/12/21 1453 04/12/21 2316 04/13/21 0142 04/14/21 0500 04/15/21 0528 04/16/21 0543  WBC 5.9 11.0* 9.2 11.6* 6.0 6.3  NEUTROABS 2.5  --   --  5.1  --   --   HGB 13.8 12.9* 12.7* 12.9* 12.4* 12.1*  HCT 42.8 41.6 40.3 42.2 40.5 38.9*  MCV 94.7 96.5 95.3 98.1 97.1 94.2  PLT 86* 86* 84* 79* 64* 74*   Cardiac Enzymes: No results for input(s): CKTOTAL, CKMB, CKMBINDEX, TROPONINI in the last 168 hours. BNP: Invalid input(s): POCBNP CBG: Recent Labs  Lab 04/14/21 2035 04/15/21 0744 04/15/21 1107 04/16/21 0837 04/16/21 1210  GLUCAP 138* 146* 155* 100* 138*   HbA1C: No results for input(s): HGBA1C in the last 72 hours. Urine analysis:    Component Value Date/Time   COLORURINE YELLOW 04/13/2021 0348  APPEARANCEUR CLEAR 04/13/2021 0348   LABSPEC 1.025 04/13/2021 0348   PHURINE 5.0 04/13/2021 0348   GLUCOSEU NEGATIVE 04/13/2021 0348   HGBUR SMALL (A) 04/13/2021 0348   BILIRUBINUR NEGATIVE 04/13/2021 0348   KETONESUR NEGATIVE 04/13/2021 0348   PROTEINUR 100 (A) 04/13/2021 0348   NITRITE NEGATIVE 04/13/2021 0348   LEUKOCYTESUR SMALL (A) 04/13/2021 0348   Sepsis Labs: @LABRCNTIP (procalcitonin:4,lacticidven:4) ) Recent Results (from the past 240 hour(s))  Resp Panel by RT-PCR (Flu A&B, Covid) Nasopharyngeal Swab     Status: None   Collection Time: 04/12/21  2:59 PM   Specimen: Nasopharyngeal Swab; Nasopharyngeal(NP) swabs in vial transport medium  Result Value Ref Range Status   SARS Coronavirus 2 by RT PCR NEGATIVE NEGATIVE Final    Comment: (NOTE) SARS-CoV-2 target nucleic acids are NOT DETECTED.  The SARS-CoV-2 RNA is generally detectable in upper respiratory specimens during the acute phase of infection. The lowest concentration of  SARS-CoV-2 viral copies this assay can detect is 138 copies/mL. A negative result does not preclude SARS-Cov-2 infection and should not be used as the sole basis for treatment or other patient management decisions. A negative result may occur with  improper specimen collection/handling, submission of specimen other than nasopharyngeal swab, presence of viral mutation(s) within the areas targeted by this assay, and inadequate number of viral copies(<138 copies/mL). A negative result must be combined with clinical observations, patient history, and epidemiological information. The expected result is Negative.  Fact Sheet for Patients:  EntrepreneurPulse.com.au  Fact Sheet for Healthcare Providers:  IncredibleEmployment.be  This test is no t yet approved or cleared by the [place] FDA and  has been authorized for detection and/or diagnosis of SARS-CoV-2 by FDA under an Emergency Use Authorization (EUA). This EUA will remain  in effect (meaning this test can be used) for the duration of the COVID-19 declaration under Section 564(b)(1) of the Act, 21 U.S.C.section 360bbb-3(b)(1), unless the authorization is terminated  or revoked sooner.       Influenza A by PCR NEGATIVE NEGATIVE Final   Influenza B by PCR NEGATIVE NEGATIVE Final    Comment: (NOTE) The Xpert Xpress SARS-CoV-2/FLU/RSV plus assay is intended as an aid in the diagnosis of influenza from Nasopharyngeal swab specimens and should not be used as a sole basis for treatment. Nasal washings and aspirates are unacceptable for Xpert Xpress SARS-CoV-2/FLU/RSV testing.  Fact Sheet for Patients: EntrepreneurPulse.com.au  Fact Sheet for Healthcare Providers: IncredibleEmployment.be  This test is not yet approved or cleared by the [place] FDA and has been authorized for detection and/or diagnosis of SARS-CoV-2 by FDA under an Emergency Use  Authorization (EUA). This EUA will remain in effect (meaning this test can be used) for the duration of the COVID-19 declaration under Section 564(b)(1) of the Act, 21 U.S.C. section 360bbb-3(b)(1), unless the authorization is terminated or revoked.  Performed at [facility], [address] [place], [place] [address]   MRSA Next Gen by PCR, Nasal     Status: None   Collection Time: 04/12/21  7:50 PM   Specimen: Nasal Mucosa; Nasal Swab  Result Value Ref Range Status   MRSA by PCR Next Gen NOT DETECTED NOT DETECTED Final    Comment: (NOTE) The GeneXpert MRSA Assay (FDA approved for NASAL specimens only), is one component of a comprehensive MRSA colonization surveillance program. It is not intended to diagnose MRSA infection nor to guide or monitor treatment for MRSA infections. Test performance is not FDA approved in patients less than 71 years old.  Performed at [facility], [address], [place], [place] [address]   Urine Culture     Status: Abnormal   Collection Time: 04/13/21  3:48 AM   Specimen: Urine, Clean Catch  Result Value Ref Range Status   Specimen Description   Final    URINE, CLEAN CATCH Performed at [facility], [address], [place], [place] [address]    Special Requests   Final    NONE Performed at [facility], [address] [place], [place] [address]    Culture MULTIPLE SPECIES PRESENT, SUGGEST RECOLLECTION (A)  Final   Report Status 04/14/2021 FINAL  Final  Culture, blood (Routine X 2) w Reflex to ID Panel     Status: None (Preliminary result)   Collection Time: 04/13/21  4:18 AM   Specimen: Right Antecubital; Blood  Result Value Ref Range Status   Specimen Description RIGHT ANTECUBITAL  Final   Special Requests   Final    BOTTLES DRAWN AEROBIC AND ANAEROBIC Blood Culture adequate volume   Culture   Final    NO GROWTH 3 DAYS Performed at [facility], [address], [place], [place] [address]    Report Status PENDING  Incomplete  Culture, blood  (Routine X 2) w Reflex to ID Panel     Status: None (Preliminary result)   Collection Time: 04/13/21  4:18 AM   Specimen: BLOOD RIGHT HAND  Result Value Ref Range Status   Specimen Description BLOOD RIGHT HAND  Final   Special Requests   Final    BOTTLES DRAWN AEROBIC AND ANAEROBIC Blood Culture adequate volume   Culture   Final    NO GROWTH 3 DAYS Performed at [facility], [address], [place], [place] [address]    Report Status PENDING  Incomplete     Scheduled Meds:  aspirin EC  81 mg Oral Daily   azithromycin  500 mg Oral Daily   Chlorhexidine Gluconate Cloth  6 each Topical Daily   feeding supplement  237 mL Oral BID BM   mouth rinse  15 mL Mouth Rinse BID   predniSONE  50 mg Oral Q breakfast   simvastatin  40 mg Oral q1800   Continuous Infusions:  sodium chloride 250 mL (04/13/21 0915)   ceFEPime (MAXIPIME) IV 2 g (04/16/21 8937)    Procedures/Studies: NM Pulmonary Perfusion  Result Date: 04/13/2021 CLINICAL DATA:  Respiratory failure. Acute hypoxic respiratory failure. EXAM: NUCLEAR MEDICINE PERFUSION LUNG SCAN TECHNIQUE: Perfusion images were obtained in multiple projections after intravenous injection of radiopharmaceutical. Ventilation scans intentionally deferred if perfusion scan and chest x-ray adequate for interpretation during COVID 19 epidemic. RADIOPHARMACEUTICALS:  4.2 mCi Tc-18m MAA IV COMPARISON:  Chest radiography same day FINDINGS: No evidence of segmental or subsegmental perfusion anomalies. Small amount of fluid seen in the fissures. Findings PICC low probability of pulmonary emboli. IMPRESSION: No segmental or subsegmental perfusion anomalies. Fissure signs which could be seen with small effusions/early congestive heart failure. Study predicts low probability of pulmonary emboli. Electronically Signed   By: Nelson Chimes M.D.   On: 04/13/2021 13:36   US RENAL  Result Date: 04/14/2021 CLINICAL DATA:  Renal dysfunction EXAM: RENAL / URINARY TRACT ULTRASOUND  COMPLETE COMPARISON:  CT done on 11/04/2020 FINDINGS: Right Kidney: Renal measurements: 9.8 x 5.3 x 3.9 cm = volume: 105.1 mL. There is no hydronephrosis. There is interval resolution of right hydronephrosis since the CT done on 11/04/2020. There are multiple smooth marginated anechoic lesions in the right kidney largest measuring  4.9 cm in the lower pole. Left Kidney: Renal measurements: 10.6 x 4.6 x 5.6 cm = volume: 141.4 mL. There is mild lobulation in the margin. Technologist observed 7 mm hyperechoic structure in the upper pole which was not distinctly seen in the previous CT and may be a partial volume averaging artifact. There is possible 10 mm cyst in the lower pole. Left adrenal is not visualized in the submitted images. Bladder: Urinary bladder is not distended. Other: None. IMPRESSION: There is no hydronephrosis.  Bilateral renal cysts. Electronically Signed   By: Elmer Picker M.D.   On: 04/14/2021 16:16   DG CHEST PORT 1 VIEW  Result Date: 04/13/2021 CLINICAL DATA:  Difficulty breathing. EXAM: PORTABLE CHEST 1 VIEW COMPARISON:  Portable chest yesterday at 3:09 p.m. FINDINGS: 4:47 a.m., 04/13/2021. There is mild-to-moderate cardiomegaly. Mild central vascular fullness continues to be noted without appreciable edema. Sternotomy and CABG changes are redemonstrated as well as a single lead left chest cardiac assist device and wire insertion. There is increased streaky atelectasis or infiltrate in the retrocardiac left lower lobe and increased patchy opacities in the right lung base concerning for pneumonia with a small underlying right pleural effusion. Remaining lung fields remain clear. No other interval changes. Osteopenia. IMPRESSION: Increasing opacity in the left infrahilar retrocardiac lower lobe and right lung base concerning for pneumonia with worsening. Stable small right pleural effusion. Cardiomegaly. Electronically Signed   By: Telford Nab M.D.   On: 04/13/2021 05:49   DG Chest  Port 1 View  Result Date: 04/12/2021 CLINICAL DATA:  SOB EXAM: PORTABLE CHEST 1 VIEW COMPARISON:  August 2022 FINDINGS: Implantable cardiac device with stable position of the lead. The heart appears within normal limits for size. Probable small right pleural effusion. No left pleural effusion. No pneumothorax. Heterogeneous right lower lung airspace opacities. Osteopenia with healing/chronic right rib fractures, present on previous exam. Median sternotomy wires. IMPRESSION: There are new right lower lung heterogeneous airspace opacities with small right pleural effusion. Electronically Signed   By: Albin Felling M.D.   On: 04/12/2021 15:17    Orson Eva, DO  [facility]  If 7PM-7AM, please contact night-coverage [email or web address] Password [email or web address] 04/16/2021, 1:53 PM   LOS: 4 days

## 2021-04-17 DIAGNOSIS — I5023 Acute on chronic systolic (congestive) heart failure: Secondary | ICD-10-CM | POA: Diagnosis not present

## 2021-04-17 DIAGNOSIS — I480 Paroxysmal atrial fibrillation: Secondary | ICD-10-CM | POA: Diagnosis not present

## 2021-04-17 DIAGNOSIS — N179 Acute kidney failure, unspecified: Secondary | ICD-10-CM | POA: Diagnosis not present

## 2021-04-17 DIAGNOSIS — Z95810 Presence of automatic (implantable) cardiac defibrillator: Secondary | ICD-10-CM | POA: Diagnosis not present

## 2021-04-17 DIAGNOSIS — J9601 Acute respiratory failure with hypoxia: Secondary | ICD-10-CM | POA: Diagnosis not present

## 2021-04-17 DIAGNOSIS — I25110 Atherosclerotic heart disease of native coronary artery with unstable angina pectoris: Secondary | ICD-10-CM

## 2021-04-17 DIAGNOSIS — I10 Essential (primary) hypertension: Secondary | ICD-10-CM | POA: Diagnosis not present

## 2021-04-17 LAB — BASIC METABOLIC PANEL
Anion gap: 6 (ref 5–15)
BUN: 65 mg/dL — ABNORMAL HIGH (ref 8–23)
CO2: 24 mmol/L (ref 22–32)
Calcium: 8.1 mg/dL — ABNORMAL LOW (ref 8.9–10.3)
Chloride: 104 mmol/L (ref 98–111)
Creatinine, Ser: 2.13 mg/dL — ABNORMAL HIGH (ref 0.61–1.24)
GFR, Estimated: 28 mL/min — ABNORMAL LOW (ref >60)
Glucose, Bld: 287 mg/dL — ABNORMAL HIGH (ref 70–99)
Potassium: 4.6 mmol/L (ref 3.5–5.1)
Sodium: 134 mmol/L — ABNORMAL LOW (ref 135–145)

## 2021-04-17 LAB — PATHOLOGIST SMEAR REVIEW

## 2021-04-17 LAB — GLUCOSE, CAPILLARY
Glucose-Capillary: 140 mg/dL — ABNORMAL HIGH (ref 70–99)
Glucose-Capillary: 173 mg/dL — ABNORMAL HIGH (ref 70–99)
Glucose-Capillary: 320 mg/dL — ABNORMAL HIGH (ref 70–99)
Glucose-Capillary: 391 mg/dL — ABNORMAL HIGH (ref 70–99)

## 2021-04-17 LAB — CBC
HCT: 40.2 % (ref 39.0–52.0)
Hemoglobin: 13 g/dL (ref 13.0–17.0)
MCH: 30.2 pg (ref 26.0–34.0)
MCHC: 32.3 g/dL (ref 30.0–36.0)
MCV: 93.3 fL (ref 80.0–100.0)
Platelets: 84 10*3/uL — ABNORMAL LOW (ref 150–400)
RBC: 4.31 MIL/uL (ref 4.22–5.81)
RDW: 13.9 % (ref 11.5–15.5)
WBC: 4.1 10*3/uL (ref 4.0–10.5)
nRBC: 0 % (ref 0.0–0.2)

## 2021-04-17 MED ORDER — PREDNISONE 20 MG PO TABS
20.0000 mg | ORAL_TABLET | Freq: Every day | ORAL | Status: DC
  Administered 2021-04-18 – 2021-04-21 (×3): 20 mg via ORAL
  Filled 2021-04-17 (×3): qty 1

## 2021-04-17 NOTE — Plan of Care (Signed)
  Problem: Education: Goal: Knowledge of General Education information will improve Description: Including pain rating scale, medication(s)/side effects and non-pharmacologic comfort measures Outcome: Progressing   Problem: Clinical Measurements: Goal: Ability to maintain clinical measurements within normal limits will improve Outcome: Progressing Goal: Will remain free from infection Outcome: Progressing Goal: Diagnostic test results will improve Outcome: Progressing Goal: Respiratory complications will improve Outcome: Progressing Goal: Cardiovascular complication will be avoided Outcome: Progressing   Problem: Activity: Goal: Risk for activity intolerance will decrease Outcome: Progressing   Problem: Nutrition: Goal: Adequate nutrition will be maintained Outcome: Progressing   Problem: Safety: Goal: Ability to remain free from injury will improve Outcome: Progressing   

## 2021-04-17 NOTE — TOC Progression Note (Addendum)
Transition of Care [facility]) - Progression Note    Patient Details  Name: [name] MRN: [id number] Date of Birth: February 10, 1927  [facility] CM/SW Contact  Boneta Lucks, RN Phone Number: 04/17/2021, 4:04 PM  Clinical Narrative:   [facility].  Patients wife [name] accepted. Cardiology needs to clear patient . Updated Ebony Hail we will plan for discharge tomorrow.  Addendum: Maryjean Morn patient is not medically ready for discharge.   Expected Discharge Plan: [place] Barriers to Discharge: Continued Medical Work up  Expected Discharge Plan and Services Expected Discharge Plan: [place] arrangements for the past 2 months: Single Family Home          Readmission Risk Interventions Readmission Risk Prevention Plan 04/17/2021 04/13/2021  Transportation Screening Complete Complete  PCP or Specialist Appt within 3-5 Days - Complete  HRI or [place] - Complete  Social Work Consult for [place] Planning/Counseling - Complete  Palliative Care Screening - Complete  Medication Review Press photographer) Complete Complete  PCP or Specialist appointment within 3-5 days of discharge Complete -  [place] or Home Care Consult Complete -  SW Recovery Care/Counseling Consult Complete -  Palliative Care Screening Not Applicable -  [place] Complete -  Some recent data might be hidden

## 2021-04-17 NOTE — Progress Notes (Signed)
Progress Note  Patient Name: [name] Date of Encounter: 04/17/2021  [facility] Cardiologist: Rozann Lesches, MD   Subjective   Feeling better. Sitting up in a chair. Continues to have cough. No chest pain or palpitations.  Inpatient Medications    Scheduled Meds:  aspirin EC  81 mg Oral Daily   azithromycin  500 mg Oral Daily   Chlorhexidine Gluconate Cloth  6 each Topical Daily   feeding supplement  237 mL Oral BID BM   ipratropium-albuterol  3 mL Nebulization BID   mouth rinse  15 mL Mouth Rinse BID   predniSONE  40 mg Oral Q breakfast   simvastatin  40 mg Oral q1800   Continuous Infusions:  sodium chloride 250 mL (04/13/21 0915)   ceFEPime (MAXIPIME) IV 2 g (04/17/21 0644)   PRN Meds: acetaminophen **OR** acetaminophen, guaiFENesin-dextromethorphan, ipratropium-albuterol, loperamide, prochlorperazine   Vital Signs    Vitals:   04/16/21 2029 04/17/21 0500 04/17/21 0546 04/17/21 0837  BP:   113/64   Pulse: 86  70   Resp: 18  18   Temp:   (!) 97.1 F (36.2 C)   TempSrc:      SpO2: 90%  97% 94%  Weight:  76.8 kg    Height:        Intake/Output Summary (Last 24 hours) at 04/17/2021 1015 Last data filed at 04/17/2021 0900 Gross per 24 hour  Intake 480 ml  Output 400 ml  Net 80 ml   Last 3 Weights 04/17/2021 04/16/2021 04/15/2021  Weight (lbs) 169 lb 5 oz 167 lb 5.3 oz 165 lb 9.1 oz  Weight (kg) 76.8 kg 75.9 kg 75.1 kg      Telemetry    NSR, very frequent PACs - Personally Reviewed  ECG    No new tracing today- Personally Reviewed  Physical Exam   GEN: Elderly male, sitting up in the chair, comfortable  Neck: No JVD Cardiac: RRR with frequent ectpy no murmurs Respiratory: Scattered rhonchi. Good air movement GI: Soft, nontender, non-distended  MS: No edema; No deformity. Neuro:  Nonfocal  Psych: Normal affect   Labs    High Sensitivity Troponin:   Recent Labs  Lab 04/12/21 2143 04/12/21 2316 04/13/21 0142 04/13/21 0418   TROPONINIHS 162* 184* 189* 221*     Chemistry Recent Labs  Lab 04/12/21 1453 04/12/21 2316 04/13/21 0142 04/14/21 0500 04/15/21 0528 04/16/21 0543  NA 137   < > 137 136 138 140  K 3.7   < > 4.0 5.1 4.5 3.9  CL 101   < > 103 105 106 107  CO2 26   < > 25 19* 24 24  GLUCOSE 209*   < > 109* 118* 168* 126*  BUN 24*   < > 31* 48* 61* 68*  CREATININE 1.53*   < > 2.12* 2.83* 2.97* 2.54*  CALCIUM 9.0   < > 8.5* 8.0* 8.2* 8.1*  MG  --   --  1.4*  --   --  2.3  PROT 6.4*  --   --  5.7*  --   --   ALBUMIN 3.7  --   --  3.0*  --   --   AST 42*  --   --  33  --   --   ALT 36  --   --  26  --   --   ALKPHOS 72  --   --  63  --   --   BILITOT 2.3*  --   --  0.8  --   --   GFRNONAA 42*   < > 29* 20* 19* 23*  ANIONGAP 10   < > 9 12 8 9    < > = values in this interval not displayed.    Lipids No results for input(s): CHOL, TRIG, HDL, LABVLDL, LDLCALC, CHOLHDL in the last 168 hours.  Hematology Recent Labs  Lab 04/14/21 0500 04/15/21 0528 04/16/21 0543  WBC 11.6* 6.0 6.3  RBC 4.30 4.17* 4.13*  HGB 12.9* 12.4* 12.1*  HCT 42.2 40.5 38.9*  MCV 98.1 97.1 94.2  MCH 30.0 29.7 29.3  MCHC 30.6 30.6 31.1  RDW 14.2 14.0 13.9  PLT 79* 64* 74*   Thyroid No results for input(s): TSH, FREET4 in the last 168 hours.  BNP Recent Labs  Lab 04/12/21 1453  BNP 1,568.0*    DDimer No results for input(s): DDIMER in the last 168 hours.   Radiology    No results found.  Cardiac Studies   Echocardiogram 12/17/2020:  1. Poor acoustic windows.   2. Poor acoustic windows limit study Endocardium is not seen in all  segments even with DEFINITY There appears to be hypokinesis of the basal  inferior, basal inferoseptal, the inferolaterall and apical walls OVerall  LVEF appears mildly depressed LVEF 40  to 45%, again EF difficult given poor image quality. . The left  ventricular internal cavity size was severely dilated. There is mild left  ventricular hypertrophy. Left ventricular diastolic  parameters are  indeterminate.   3. Device lead seen in RV to distal apex. Right ventricular systolic  function is normal. The right ventricular size is normal. There is  moderately elevated pulmonary artery systolic pressure.   4. Left atrial size was severely dilated.   5. The mitral valve is normal in structure. Mild mitral valve  regurgitation.   6. The aortic valve is tricuspid. Aortic valve regurgitation is not  visualized. Mild to moderate aortic valve sclerosis/calcification is  present, without any evidence of aortic stenosis.   7. The inferior vena cava is dilated in size with <50% respiratory  variability, suggesting right atrial pressure of 15 mmHg.   Patient Profile     87 y.o. male  CAD (s/p CABG in 2006 with LIMA-LAD, SVG-LCx and SVG-dRCA, repeat cath in 12/2020 showing 3/3 patent grafts and likely culprit was 80% LPAV and medical management recommended), HFrEF/ICM (EF at 45-50% in 2016, at 40-45% by repeat echo in 12/2020), [employer] ICD at [facility] (previously followed by Dr. Lovena Le and at [facility] in 2016 and not replaced due to improvement in his EF), history of prostate cancer, nephrolithiasis, MDS, HTN and HLD who presented with septic shock secondary to pneumonia. Cardiology was consulted for Afib and CHF.   Assessment & Plan    #Sepsis secondary to pneumonia: Left infrahilar retrocardiac lower lobe and right lung infiltrates by chest x-ray as well as small pleural effusion.  Influenza A and COVID-19 negative.  Blood cultures negative so far.  Initially required pressors but now weaned off. Currently on cefepime, azithro and prednisone. -Continue management per primary  #Transient atrial fibrillation: Noted on admission now back in NSR. Likely developed in the setting of PNA. CHADs-vasc 5 but not on AC currently due to transient episode. Tele with NSR with very frequent PACs.  -Continue tele to monitor -Will plan for monitor on discharge to assess for Afib  #Chronic Systolic  HF: EF 16-96%. Presented 10lbs down from baseline. Remains euvolemic. -Will add GDMT back as able given improvement of  blood pressures (likely add low dose BB tomorrow) -Continue to hold losartan, spiro and lasix due to AKI on CKD  #CAD s/p CABG: S/p CABG in 2006 with LIMA-LAD, SVG-LCx and SVG-dRCA, repeat cath in 12/2020 showing 3/3 patent grafts and likely culprit was 80% LPAV and medical management recommended. No anginal symptoms. -Continue ASA 81mg  daily -Continue simva 40mg  daily -Will add back GDMT as able  #AKI on CKDIII: Improving slowly -Management as above -Continue to hold losartan, spiro and lasix     For questions or updates, please contact [place] Please consult [email or web address] for contact info under        Signed, Freada Bergeron, MD  04/17/2021, 10:15 AM

## 2021-04-17 NOTE — Progress Notes (Addendum)
PROGRESS NOTE  [name] OZD:[id number] DOB: 28-Dec-1926 DOA: 04/12/2021 PCP: [name], MD  Brief History:  87 year old male with a history of coronary artery disease status post CABG, systolic CHF with EF 25-95%, AICD, prostate cancer, MDS, hypertension, hyperlipidemia presenting with shortness of breath of 1 week duration.  History is supplemented by the patient's wife.  Apparently his shortness of breath continue to worsen over the past 2 to 3 days prior to admission.  He had a cough with cream-colored sputum.  There is no hemoptysis, fevers, chills, chest pain, nausea, vomiting, diarrhea, abdominal pain.  The patient has bilateral lower extremity edema which appears to be chronic.  Spouse states that his edema is actually a little bit better than usual.  Upon EMS activation.  The patient was noted to have oxygen saturation of 60% on room air.  He was placed on nonrebreather.  In the emergency department, patient was initially afebrile and hemodynamically stable.  He was placed on 6 L with oxygen saturation in the low 90s.  Chest x-ray showed bibasilar opacities, regular in the left.  BMP showed sodium sodium 137, potassium 3.7, bicarbonate 26, serum creatinine 1.53.  WBC 5.9, hemoglobin 13.8, platelets 86,000.   Notably, the patient had a hospital admission from 12/17/2020 to 12/20/2020 for chest pain concerning for unstable angina.  He was started on IV heparin.  He underwent cardiac catheterization.  . Repeat catheterization showed 3/3 patent grafts and the likely culprit lesion was 80% stenosis in the distal LPAV prior to bifurcation into LPL 1 and LPL 2 which was filled from retrograde flow from the SVG to OM 2 and was not approachable for PCI and medical management was recommended. Coreg was increased to 12.5 mg twice daily and Losartan increased to 50 mg daily. He was continued on ASA 81 mg daily and Crestor 20 mg daily with consideration of adding Spironolactone or an SGLT2  inhibitor as an outpatient if renal function remained stable (was at 1.05 on the day of discharge).  He was admitted to the ICU.  He continued to have hypoxia and was placed on BiPAP.  He subsequently developed hypotension requiring fluid resuscitation and vasopressor support.  UA was negative for pyuria.   Assessment/Plan: Sepsis -Present on admission -Presented with tachypnea, fever, and respiratory failure -Secondary to pneumonia -PCT 43.33>>57.55 -Follow blood cultures--neg to date -UA negative for pyuria -remains of neosynephrine 25 mcg>>weaned off 04/14/21 -continue solucortef>>wean off   Lobar pneumonia -Personally reviewed chest x-ray--right greater than left basilar opacity -Continue cefepime and azithromycin -MRSA screen neg -PCT 43>>57.55   Acute respiratory failure with hypoxia and hypercarbia -Secondary to pneumonia -Currently on BiPAP FiO2 0.5 -Wean back to room air as tolerated for saturation greater 90% -weaned to 6L>>8.7F>>6E   Chronic systolic CHF/ischemic cardiomyopathy -12/17/2020 echo EF 40 to 45%, +WMA -Holding carvedilol, losartan, spironolactone secondary to hypotension -Holding furosemide temporarily   Paroxysmal Afib -transient -now in sinus with PACs -do not plan AC currently unless recurrent   Essential hypertension -Holding losartan, carvedilol, spironolactone secondary to hypotension   Coronary artery disease -No chest pain presently -Continue aspirin -Holding carvedilol, spironolactone, losartan secondary to hypotension -resume ASA and statin   Acute on chronic renal failure--CKD stage IIIa -Baseline creatinine 1.1-1.3 -In part due to hypotension and furosemide -serum creatinine peaking 2.83>>2.97>>2.54>>2.13 -renal US--no hydronephrosis   Hyperlipidemia -Continue statin   Tobacco abuse in remission -50 pk yr -quit 20 years ago  Moderate  malnutrition -continue supplements       Status is: Inpatient   Remains inpatient  appropriate because: hemodynamic instability, severity of illness requiring BiPAP and IV abx               Family Communication:   spouse updated at bedside12/5   Consultants:  cardiology   Code Status:  FULL    DVT Prophylaxis:  [place] Heparin      Procedures: As Listed in Progress Note Above   Antibiotics: Cefepime 12/1>> Azithro 12/1>>    Subjective: Patient denies fevers, chills, headache, chest pain, dyspnea, nausea, vomiting, diarrhea, abdominal pain, dysuria, hematuria, hematochezia, and melena.   Objective: Vitals:   04/17/21 0546 04/17/21 0837 04/17/21 1329 04/17/21 1338  BP: 113/64   128/65  Pulse: 70   94  Resp: 18   18  Temp: (!) 97.1 F (36.2 C)   (!) 97.5 F (36.4 C)  TempSrc:      SpO2: 97% 94% 95% 94%  Weight:      Height:        Intake/Output Summary (Last 24 hours) at 04/17/2021 1714 Last data filed at 04/17/2021 1300 Gross per 24 hour  Intake 480 ml  Output --  Net 480 ml   Weight change: 0.9 kg Exam:  General:  Pt is alert, follows commands appropriately, not in acute distress HEENT: No icterus, No thrush, No neck mass, [place]/AT Cardiovascular: RRR, S1/S2, no rubs, no gallops Respiratory: bilateral rales.  Diminished BS bilateral.  No wheeze Abdomen: Soft/+BS, non tender, non distended, no guarding Extremities: 1 + LE edema, No lymphangitis, No petechiae, No rashes, no synovitis   Data Reviewed: I have personally reviewed following labs and imaging studies Basic Metabolic Panel: Recent Labs  Lab 04/13/21 0142 04/14/21 0500 04/15/21 0528 04/16/21 0543 04/17/21 1415  NA 137 136 138 140 134*  K 4.0 5.1 4.5 3.9 4.6  CL 103 105 106 107 104  CO2 25 19* 24 24 24   GLUCOSE 109* 118* 168* 126* 287*  BUN 31* 48* 61* 68* 65*  CREATININE 2.12* 2.83* 2.97* 2.54* 2.13*  CALCIUM 8.5* 8.0* 8.2* 8.1* 8.1*  MG 1.4*  --   --  2.3  --   PHOS 3.4  --   --   --   --    Liver Function Tests: Recent Labs  Lab 04/12/21 1453 04/14/21 0500  AST  42* 33  ALT 36 26  ALKPHOS 72 63  BILITOT 2.3* 0.8  PROT 6.4* 5.7*  ALBUMIN 3.7 3.0*   No results for input(s): LIPASE, AMYLASE in the last 168 hours. No results for input(s): AMMONIA in the last 168 hours. Coagulation Profile: No results for input(s): INR, PROTIME in the last 168 hours. CBC: Recent Labs  Lab 04/12/21 1453 04/12/21 2316 04/13/21 0142 04/14/21 0500 04/15/21 0528 04/16/21 0543 04/17/21 1415  WBC 5.9   < > 9.2 11.6* 6.0 6.3 4.1  NEUTROABS 2.5  --   --  5.1  --   --   --   HGB 13.8   < > 12.7* 12.9* 12.4* 12.1* 13.0  HCT 42.8   < > 40.3 42.2 40.5 38.9* 40.2  MCV 94.7   < > 95.3 98.1 97.1 94.2 93.3  PLT 86*   < > 84* 79* 64* 74* 84*   < > = values in this interval not displayed.   Cardiac Enzymes: No results for input(s): CKTOTAL, CKMB, CKMBINDEX, TROPONINI in the last 168 hours. BNP: Invalid input(s): POCBNP CBG: Recent  Labs  Lab 04/16/21 1622 04/16/21 2004 04/17/21 0738 04/17/21 1113 04/17/21 1617  GLUCAP 240* 205* 140* 173* 320*   HbA1C: No results for input(s): HGBA1C in the last 72 hours. Urine analysis:    Component Value Date/Time   COLORURINE YELLOW 04/13/2021 0348   APPEARANCEUR CLEAR 04/13/2021 0348   LABSPEC 1.025 04/13/2021 0348   PHURINE 5.0 04/13/2021 0348   GLUCOSEU NEGATIVE 04/13/2021 0348   HGBUR SMALL (A) 04/13/2021 0348   BILIRUBINUR NEGATIVE 04/13/2021 0348   KETONESUR NEGATIVE 04/13/2021 0348   PROTEINUR 100 (A) 04/13/2021 0348   NITRITE NEGATIVE 04/13/2021 0348   LEUKOCYTESUR SMALL (A) 04/13/2021 0348   Sepsis Labs: @LABRCNTIP (procalcitonin:4,lacticidven:4) ) Recent Results (from the past 240 hour(s))  Resp Panel by RT-PCR (Flu A&B, Covid) Nasopharyngeal Swab     Status: None   Collection Time: 04/12/21  2:59 PM   Specimen: Nasopharyngeal Swab; Nasopharyngeal(NP) swabs in vial transport medium  Result Value Ref Range Status   SARS Coronavirus 2 by RT PCR NEGATIVE NEGATIVE Final    Comment: (NOTE) SARS-CoV-2 target  nucleic acids are NOT DETECTED.  The SARS-CoV-2 RNA is generally detectable in upper respiratory specimens during the acute phase of infection. The lowest concentration of SARS-CoV-2 viral copies this assay can detect is 138 copies/mL. A negative result does not preclude SARS-Cov-2 infection and should not be used as the sole basis for treatment or other patient management decisions. A negative result may occur with  improper specimen collection/handling, submission of specimen other than nasopharyngeal swab, presence of viral mutation(s) within the areas targeted by this assay, and inadequate number of viral copies(<138 copies/mL). A negative result must be combined with clinical observations, patient history, and epidemiological information. The expected result is Negative.  Fact Sheet for Patients:  EntrepreneurPulse.com.au  Fact Sheet for Healthcare Providers:  IncredibleEmployment.be  This test is no t yet approved or cleared by the [place] FDA and  has been authorized for detection and/or diagnosis of SARS-CoV-2 by FDA under an Emergency Use Authorization (EUA). This EUA will remain  in effect (meaning this test can be used) for the duration of the COVID-19 declaration under Section 564(b)(1) of the Act, 21 U.S.C.section 360bbb-3(b)(1), unless the authorization is terminated  or revoked sooner.       Influenza A by PCR NEGATIVE NEGATIVE Final   Influenza B by PCR NEGATIVE NEGATIVE Final    Comment: (NOTE) The Xpert Xpress SARS-CoV-2/FLU/RSV plus assay is intended as an aid in the diagnosis of influenza from Nasopharyngeal swab specimens and should not be used as a sole basis for treatment. Nasal washings and aspirates are unacceptable for Xpert Xpress SARS-CoV-2/FLU/RSV testing.  Fact Sheet for Patients: EntrepreneurPulse.com.au  Fact Sheet for Healthcare  Providers: IncredibleEmployment.be  This test is not yet approved or cleared by the [place] FDA and has been authorized for detection and/or diagnosis of SARS-CoV-2 by FDA under an Emergency Use Authorization (EUA). This EUA will remain in effect (meaning this test can be used) for the duration of the COVID-19 declaration under Section 564(b)(1) of the Act, 21 U.S.C. section 360bbb-3(b)(1), unless the authorization is terminated or revoked.  Performed at [facility], [address], [place], [place] [address]   MRSA Next Gen by PCR, Nasal     Status: None   Collection Time: 04/12/21  7:50 PM   Specimen: Nasal Mucosa; Nasal Swab  Result Value Ref Range Status   MRSA by PCR Next Gen NOT DETECTED NOT DETECTED Final    Comment: (NOTE) The  GeneXpert MRSA Assay (FDA approved for NASAL specimens only), is one component of a comprehensive MRSA colonization surveillance program. It is not intended to diagnose MRSA infection nor to guide or monitor treatment for MRSA infections. Test performance is not FDA approved in patients less than 71 years old. Performed at [facility], [address], [place], [place] [address]   Urine Culture     Status: Abnormal   Collection Time: 04/13/21  3:48 AM   Specimen: Urine, Clean Catch  Result Value Ref Range Status   Specimen Description   Final    URINE, CLEAN CATCH Performed at [facility], [address], [place], [place] [address]    Special Requests   Final    NONE Performed at [facility], [address], [place], [place] [address]    Culture MULTIPLE SPECIES PRESENT, SUGGEST RECOLLECTION (A)  Final   Report Status 04/14/2021 FINAL  Final  Culture, blood (Routine X 2) w Reflex to ID Panel     Status: None (Preliminary result)   Collection Time: 04/13/21  4:18 AM   Specimen: Right Antecubital; Blood  Result Value Ref Range Status   Specimen Description RIGHT ANTECUBITAL  Final   Special Requests   Final    BOTTLES  DRAWN AEROBIC AND ANAEROBIC Blood Culture adequate volume   Culture   Final    NO GROWTH 4 DAYS Performed at [facility], [address], [place], [place] [address]    Report Status PENDING  Incomplete  Culture, blood (Routine X 2) w Reflex to ID Panel     Status: None (Preliminary result)   Collection Time: 04/13/21  4:18 AM   Specimen: BLOOD RIGHT HAND  Result Value Ref Range Status   Specimen Description BLOOD RIGHT HAND  Final   Special Requests   Final    BOTTLES DRAWN AEROBIC AND ANAEROBIC Blood Culture adequate volume   Culture   Final    NO GROWTH 4 DAYS Performed at [facility], [address], [place],  [address]    Report Status PENDING  Incomplete     Scheduled Meds:  aspirin EC  81 mg Oral Daily   azithromycin  500 mg Oral Daily   Chlorhexidine Gluconate Cloth  6 each Topical Daily   feeding supplement  237 mL Oral BID BM   ipratropium-albuterol  3 mL Nebulization BID   mouth rinse  15 mL Mouth Rinse BID   predniSONE  40 mg Oral Q breakfast   simvastatin  40 mg Oral q1800   Continuous Infusions:  sodium chloride 250 mL (04/13/21 0915)   ceFEPime (MAXIPIME) IV 2 g (04/17/21 0644)    Procedures/Studies: NM Pulmonary Perfusion  Result Date: 04/13/2021 CLINICAL DATA:  Respiratory failure. Acute hypoxic respiratory failure. EXAM: NUCLEAR MEDICINE PERFUSION LUNG SCAN TECHNIQUE: Perfusion images were obtained in multiple projections after intravenous injection of radiopharmaceutical. Ventilation scans intentionally deferred if perfusion scan and chest x-ray adequate for interpretation during COVID 19 epidemic. RADIOPHARMACEUTICALS:  4.2 mCi Tc-65m MAA IV COMPARISON:  Chest radiography same day FINDINGS: No evidence of segmental or subsegmental perfusion anomalies. Small amount of fluid seen in the fissures. Findings PICC low probability of pulmonary emboli. IMPRESSION: No segmental or subsegmental perfusion anomalies. Fissure signs which could be seen with small  effusions/early congestive heart failure. Study predicts low probability of pulmonary emboli. Electronically Signed   By: Nelson Chimes M.D.   On: 04/13/2021 13:36   US RENAL  Result Date: 04/14/2021 CLINICAL DATA:  Renal dysfunction EXAM:  RENAL / URINARY TRACT ULTRASOUND COMPLETE COMPARISON:  CT done on 11/04/2020 FINDINGS: Right Kidney: Renal measurements: 9.8 x 5.3 x 3.9 cm = volume: 105.1 mL. There is no hydronephrosis. There is interval resolution of right hydronephrosis since the CT done on 11/04/2020. There are multiple smooth marginated anechoic lesions in the right kidney largest measuring 4.9 cm in the lower pole. Left Kidney: Renal measurements: 10.6 x 4.6 x 5.6 cm = volume: 141.4 mL. There is mild lobulation in the margin. Technologist observed 7 mm hyperechoic structure in the upper pole which was not distinctly seen in the previous CT and may be a partial volume averaging artifact. There is possible 10 mm cyst in the lower pole. Left adrenal is not visualized in the submitted images. Bladder: Urinary bladder is not distended. Other: None. IMPRESSION: There is no hydronephrosis.  Bilateral renal cysts. Electronically Signed   By: Elmer Picker M.D.   On: 04/14/2021 16:16   DG CHEST PORT 1 VIEW  Result Date: 04/13/2021 CLINICAL DATA:  Difficulty breathing. EXAM: PORTABLE CHEST 1 VIEW COMPARISON:  Portable chest yesterday at 3:09 p.m. FINDINGS: 4:47 a.m., 04/13/2021. There is mild-to-moderate cardiomegaly. Mild central vascular fullness continues to be noted without appreciable edema. Sternotomy and CABG changes are redemonstrated as well as a single lead left chest cardiac assist device and wire insertion. There is increased streaky atelectasis or infiltrate in the retrocardiac left lower lobe and increased patchy opacities in the right lung base concerning for pneumonia with a small underlying right pleural effusion. Remaining lung fields remain clear. No other interval changes. Osteopenia.  IMPRESSION: Increasing opacity in the left infrahilar retrocardiac lower lobe and right lung base concerning for pneumonia with worsening. Stable small right pleural effusion. Cardiomegaly. Electronically Signed   By: Telford Nab M.D.   On: 04/13/2021 05:49   DG Chest Port 1 View  Result Date: 04/12/2021 CLINICAL DATA:  SOB EXAM: PORTABLE CHEST 1 VIEW COMPARISON:  August 2022 FINDINGS: Implantable cardiac device with stable position of the lead. The heart appears within normal limits for size. Probable small right pleural effusion. No left pleural effusion. No pneumothorax. Heterogeneous right lower lung airspace opacities. Osteopenia with healing/chronic right rib fractures, present on previous exam. Median sternotomy wires. IMPRESSION: There are new right lower lung heterogeneous airspace opacities with small right pleural effusion. Electronically Signed   By: Albin Felling M.D.   On: 04/12/2021 15:17    Orson Eva, DO  [facility]  If 7PM-7AM, please contact night-coverage [email or web address] Password [email or web address] 04/17/2021, 5:14 PM   LOS: 5 days

## 2021-04-17 NOTE — Care Management Important Message (Signed)
Important Message  Patient Details  Name: [name] MRN: [id number] Date of Birth: 01/26/1927   Medicare Important Message Given:  Yes     Tommy Medal 04/17/2021, 12:51 PM

## 2021-04-18 ENCOUNTER — Inpatient Hospital Stay: Payer: Self-pay

## 2021-04-18 ENCOUNTER — Inpatient Hospital Stay (HOSPITAL_COMMUNITY): Payer: Medicare Other

## 2021-04-18 DIAGNOSIS — I5041 Acute combined systolic (congestive) and diastolic (congestive) heart failure: Secondary | ICD-10-CM

## 2021-04-18 DIAGNOSIS — J181 Lobar pneumonia, unspecified organism: Secondary | ICD-10-CM | POA: Diagnosis not present

## 2021-04-18 DIAGNOSIS — I5022 Chronic systolic (congestive) heart failure: Secondary | ICD-10-CM | POA: Diagnosis not present

## 2021-04-18 DIAGNOSIS — I4891 Unspecified atrial fibrillation: Secondary | ICD-10-CM | POA: Diagnosis not present

## 2021-04-18 DIAGNOSIS — I442 Atrioventricular block, complete: Secondary | ICD-10-CM

## 2021-04-18 DIAGNOSIS — I255 Ischemic cardiomyopathy: Secondary | ICD-10-CM | POA: Diagnosis not present

## 2021-04-18 DIAGNOSIS — J9601 Acute respiratory failure with hypoxia: Secondary | ICD-10-CM | POA: Diagnosis not present

## 2021-04-18 LAB — CBC
HCT: 37.3 % — ABNORMAL LOW (ref 39.0–52.0)
HCT: 41.5 % (ref 39.0–52.0)
Hemoglobin: 11.9 g/dL — ABNORMAL LOW (ref 13.0–17.0)
Hemoglobin: 13.2 g/dL (ref 13.0–17.0)
MCH: 29.5 pg (ref 26.0–34.0)
MCH: 29.8 pg (ref 26.0–34.0)
MCHC: 31.9 g/dL (ref 30.0–36.0)
MCHC: 31.8 g/dL (ref 30.0–36.0)
MCV: 92.6 fL (ref 80.0–100.0)
MCV: 93.7 fL (ref 80.0–100.0)
Platelets: 89 10*3/uL — ABNORMAL LOW (ref 150–400)
Platelets: 127 10*3/uL — ABNORMAL LOW (ref 150–400)
RBC: 4.03 MIL/uL — ABNORMAL LOW (ref 4.22–5.81)
RBC: 4.43 MIL/uL (ref 4.22–5.81)
RDW: 13.9 % (ref 11.5–15.5)
RDW: 14 % (ref 11.5–15.5)
WBC: 7.6 10*3/uL (ref 4.0–10.5)
WBC: 7.8 10*3/uL (ref 4.0–10.5)
nRBC: 0 % (ref 0.0–0.2)
nRBC: 0 % (ref 0.0–0.2)

## 2021-04-18 LAB — BASIC METABOLIC PANEL
Anion gap: 7 (ref 5–15)
Anion gap: 10 (ref 5–15)
BUN: 65 mg/dL — ABNORMAL HIGH (ref 8–23)
BUN: 64 mg/dL — ABNORMAL HIGH (ref 8–23)
CO2: 24 mmol/L (ref 22–32)
CO2: 21 mmol/L — ABNORMAL LOW (ref 22–32)
Calcium: 8.3 mg/dL — ABNORMAL LOW (ref 8.9–10.3)
Calcium: 8 mg/dL — ABNORMAL LOW (ref 8.9–10.3)
Chloride: 106 mmol/L (ref 98–111)
Chloride: 101 mmol/L (ref 98–111)
Creatinine, Ser: 2.05 mg/dL — ABNORMAL HIGH (ref 0.61–1.24)
Creatinine, Ser: 2.14 mg/dL — ABNORMAL HIGH (ref 0.61–1.24)
GFR, Estimated: 30 mL/min — ABNORMAL LOW (ref >60)
GFR, Estimated: 28 mL/min — ABNORMAL LOW (ref >60)
Glucose, Bld: 235 mg/dL — ABNORMAL HIGH (ref 70–99)
Glucose, Bld: 350 mg/dL — ABNORMAL HIGH (ref 70–99)
Potassium: 4.4 mmol/L (ref 3.5–5.1)
Potassium: 5.2 mmol/L — ABNORMAL HIGH (ref 3.5–5.1)
Sodium: 137 mmol/L (ref 135–145)
Sodium: 132 mmol/L — ABNORMAL LOW (ref 135–145)

## 2021-04-18 LAB — CULTURE, BLOOD (ROUTINE X 2)
Culture: NO GROWTH
Culture: NO GROWTH
Special Requests: ADEQUATE
Special Requests: ADEQUATE

## 2021-04-18 LAB — GLUCOSE, CAPILLARY
Glucose-Capillary: 218 mg/dL — ABNORMAL HIGH (ref 70–99)
Glucose-Capillary: 243 mg/dL — ABNORMAL HIGH (ref 70–99)
Glucose-Capillary: 280 mg/dL — ABNORMAL HIGH (ref 70–99)

## 2021-04-18 LAB — BRAIN NATRIURETIC PEPTIDE: B Natriuretic Peptide: 2074 pg/mL — ABNORMAL HIGH (ref 0.0–100.0)

## 2021-04-18 LAB — MAGNESIUM: Magnesium: 2.3 mg/dL (ref 1.7–2.4)

## 2021-04-18 MED ORDER — ONDANSETRON HCL 4 MG/2ML IJ SOLN: 4.0000 mg | Freq: Four times a day (QID) | INTRAMUSCULAR | Status: DC

## 2021-04-18 MED ORDER — LORAZEPAM 2 MG/ML IJ SOLN
INTRAMUSCULAR | Status: AC
  Filled 2021-04-18: qty 1

## 2021-04-18 MED ORDER — SODIUM ZIRCONIUM CYCLOSILICATE 10 G PO PACK
10.0000 g | PACK | Freq: Once | ORAL | Status: DC
  Filled 2021-04-18: qty 1

## 2021-04-18 MED ORDER — FUROSEMIDE 10 MG/ML IJ SOLN
60.0000 mg | Freq: Once | INTRAMUSCULAR | Status: AC
Start: 2021-04-18 — End: 2021-04-18
  Administered 2021-04-18: 60 mg via INTRAVENOUS
  Filled 2021-04-18: qty 6

## 2021-04-18 MED ORDER — METOPROLOL SUCCINATE ER 50 MG PO TB24
50.0000 mg | ORAL_TABLET | Freq: Every day | ORAL | Status: DC
  Administered 2021-04-18: 50 mg via ORAL
  Filled 2021-04-18: qty 1

## 2021-04-18 MED ORDER — LORAZEPAM 2 MG/ML IJ SOLN
0.5000 mg | Freq: Once | INTRAMUSCULAR | Status: AC
  Administered 2021-04-18: 0.5 mg via INTRAVENOUS

## 2021-04-18 MED ORDER — ONDANSETRON HCL 4 MG/2ML IJ SOLN
INTRAMUSCULAR | Status: AC
  Filled 2021-04-18: qty 2

## 2021-04-18 MED ORDER — DOBUTAMINE IN D5W 4-5 MG/ML-% IV SOLN
2.5000 ug/kg/min | INTRAVENOUS | Status: DC
  Administered 2021-04-18 – 2021-04-20 (×2): 2.5 ug/kg/min via INTRAVENOUS
  Filled 2021-04-18 (×2): qty 250

## 2021-04-18 MED ORDER — CARVEDILOL 3.125 MG PO TABS: 6.2500 mg | ORAL_TABLET | Freq: Two times a day (BID) | ORAL | Status: DC

## 2021-04-18 MED ORDER — DOPAMINE-DEXTROSE 3.2-5 MG/ML-% IV SOLN
0.0000 ug/kg/min | INTRAVENOUS | Status: DC
  Administered 2021-04-18: 10 ug/kg/min via INTRAVENOUS
  Administered 2021-04-19: 14 ug/kg/min via INTRAVENOUS
  Administered 2021-04-19: 12.5 ug/kg/min via INTRAVENOUS
  Administered 2021-04-20: 5 ug/kg/min via INTRAVENOUS
  Administered 2021-04-21 (×2): 15 ug/kg/min via INTRAVENOUS
  Filled 2021-04-18 (×5): qty 250

## 2021-04-18 MED ORDER — FENTANYL CITRATE PF 50 MCG/ML IJ SOSY
12.5000 ug | PREFILLED_SYRINGE | INTRAMUSCULAR | Status: DC | PRN
  Administered 2021-04-18 – 2021-04-20 (×6): 12.5 ug via INTRAVENOUS
  Filled 2021-04-18 (×7): qty 1

## 2021-04-18 MED ORDER — ONDANSETRON HCL 4 MG/2ML IJ SOLN
4.0000 mg | Freq: Four times a day (QID) | INTRAMUSCULAR | Status: DC | PRN
  Administered 2021-04-18 – 2021-04-20 (×3): 4 mg via INTRAVENOUS
  Filled 2021-04-18 (×2): qty 2

## 2021-04-18 NOTE — Procedures (Addendum)
Procedure Note  04/18/21    Preoperative Diagnosis: Complete heart block, congestive heart failure, acute on chronic renal failure    Postoperative Diagnosis: Same   Procedure(s) Performed: Central Line placement, right internal jugular    Surgeon: Lanell Matar. Constance Haw, MD   Assistants: None   Anesthesia: 1% lidocaine    Complications: None    Indications: [name] is a 87 y.o. with complete heart block after metoprolol this AM and CHF and acute on chronic renal issues. He is not a candidate for invasive procedures to help with the block and is getting transcutaneous pacers and dopamine currently.  I discussed the risk and benefits of placement of the central line with his wife and sons, including but not limited to bleeding, infection, and risk of pneumothorax. They have given consent for the procedure.  His prior CXR was reviewed and his pacer is on the left via the left subclavian. I opted for a right sided approach.   Procedure: The patient placed supine. The right chest and neck was prepped and draped in the usual sterile fashion.  Wearing full gown and gloves, I performed the procedure.  One percent lidocaine was used for local anesthesia. The subclavian vein was accessed under the clavicle.   The needle with syringe was advanced into the subclavian vein with dark venous return but it was very brisk, so I was unsure if this was arterial or venous. I removed the needle and held pressure. I then proceeded to use the ultrasound and lidocaine was injected over the jugular. I advanced the needle into the jugular vein and the wire threaded easily using the Seldinger technique. Ectopia was not noted.  The skin was knicked and a dilator was placed, and the three lumen catheter was placed over the wire with continued control of the wire.  There was good draw back of blood from all three lumens and each flushed easily with saline.  The catheter was secured in 4 points with 2-0 silk and a biopatch and  dressing was placed.     The patient tolerated the procedure well, and the CXR was ordered to confirm position of the central line.   He was given a total of 1 mg of ativan during the procedure, but the initial 0.5mg  was unlikely received as the PIV had been pulled out by the patient.   Luke Labrum, MD [facility] [address] [place], [place] [address] [phone] (office)

## 2021-04-18 NOTE — Progress Notes (Signed)
[facility]  CXR reviewed and CVL in good position. No obvious ptx. Official read pending. Ok to use.  Curlene Labrum, MD [facility] [address] [place], [place] [address] [phone] (office)

## 2021-04-18 NOTE — Progress Notes (Signed)
**Note De-Identified  Obfuscation** RT note: Patient sitting in chair eating breakfast on RA with SAT 94%. Patient states no SOB and he is feeling better. BBS clr/dim in bases.  Duoneb BID discontinued if needed PRN HHN is available.

## 2021-04-18 NOTE — Progress Notes (Addendum)
Called by primary team for acute bradycardia. Im not able to access tele from the floor as patient already transferred to ICU. On my evaluation he has already received 1.5mg  of atropine and is being transcutaneously paced, he is mentating with SBP 100s/60s. Dopamine drip started. 12 lead shows complete heart block with junctional escape rhythm with his baseline bifascicular blocker. Able to turn pacer down and patient maintaining his native rate 40s to 50s on dopamine drip.   I suspect acute bradycardia due to undelrying conduction disease and receiving toprol XL this morning. Some what surprising as he is on coreg 12.5mg  bid at home. Monitor rates as beta blocker clears system, continue IV dopamine. If rates do not improve or unable to get off dopamine drip would need to consider possibly temp wire pacing and possibly pacemaker.       6pm addendum Had done well on dopamine at 15, was able to come off external pacer, did have issues with significant HTN SBPs 180s, HRs were in the 70s to 80s. . We  weaned dopamine down to 10 and was stable for a period. Recurrent sinus arrest/asystole, external pacer restarted. 87 yo DNR/DNI being treated for pneumonia still on abx, would not be a great pacemaker candidate, would also be hesitant for a temp wire unless absolutely neccesary. His rates did not drop until receiving toprol this afternoon so I think if can support pharmacologically rates should improve as toprol clears system. Will start dobutamine given significant HTN on dopamine, see if we can wean dopamine.      Carlyle Dolly MD

## 2021-04-18 NOTE — Plan of Care (Signed)

## 2021-04-18 NOTE — Progress Notes (Signed)
Pt a rapid response from Dept. 300. HR's have been running in the 30's-40's the whole time. Pt has been on the external pacer and on a Dopamine gtt since being in the unit. Gtt being titrated as HR and BP can tolerate. MD speaking with family regarding plan of care. Will continue to monitor.

## 2021-04-18 NOTE — Progress Notes (Signed)
Patient HR down to 28 , Dr, Tat paged to the room , Rapid Response called. Patient was diaphoretic, CBG 280mg /dl . AED machine and crash cart brought into assist in cardioversion as patients HR was not being controlled by inplanted ICD/Pacemaker. Patient being transferred to ICU at this time. Charge Nurse Audrea Muscat , RN aware. Dr. Carles Collet is aware. Report called to ICU , RN . Wife at bedside and aware of patients transfer at this time.

## 2021-04-18 NOTE — Progress Notes (Signed)
Had family meeting at 22 with spouse and 2 sons. Updated them on the events of the day and patient's current medical condition -discussed GOC -I discussed that we are providing optimal medical therapy currently for his sepsis, PNA, CHF and symptomatic bradycardia/CHB -I explained that patient is not likely a candidate for invasive procedures given his advanced age and numerous co-morbidities -We discussed what Mr. Miyoshi would have wanted if his condition worsened  -They states he would not want to suffer.  He would not want any life sustaining measure that could not restore him back to his premorbid status to enjoy his quality of life -Advance care planning, including the explanation and discussion of advance directives was carried out with the patient and family.  Code status including explanations of "Full Code" and "DNR" and alternatives were discussed in detail.  Discussion of end-of-life issues including but not limited palliative care, hospice care and the concept of hospice, other end-of-life care options, power of attorney for health care decisions, living wills, and physician orders for life-sustaining treatment were also discussed with the patient and family.  Total face to face time 20 minutes. -Family confirmed DNR, but still want to continue current care.  Orson Eva, DO

## 2021-04-18 NOTE — Progress Notes (Addendum)
PROGRESS NOTE  [name] OBS:[id number] DOB: May 16, 1926 DOA: 04/12/2021 PCP: [name], MD  Brief History:  87 year old male with a history of coronary artery disease status post CABG, systolic CHF with EF 47-65%, AICD, prostate cancer, MDS, hypertension, hyperlipidemia presenting with shortness of breath of 1 week duration.  History is supplemented by the patient's wife.  Apparently his shortness of breath continue to worsen over the past 2 to 3 days prior to admission.  He had a cough with cream-colored sputum.  There is no hemoptysis, fevers, chills, chest pain, nausea, vomiting, diarrhea, abdominal pain.  The patient has bilateral lower extremity edema which appears to be chronic.  Spouse states that his edema is actually a little bit better than usual.  Upon EMS activation.  The patient was noted to have oxygen saturation of 60% on room air.  He was placed on nonrebreather.  In the emergency department, patient was initially afebrile and hemodynamically stable.  He was placed on 6 L with oxygen saturation in the low 90s.  Chest x-ray showed bibasilar opacities, regular in the left.  BMP showed sodium sodium 137, potassium 3.7, bicarbonate 26, serum creatinine 1.53.  WBC 5.9, hemoglobin 13.8, platelets 86,000.   Notably, the patient had a hospital admission from 12/17/2020 to 12/20/2020 for chest pain concerning for unstable angina.  He was started on IV heparin.  He underwent cardiac catheterization.  . Repeat catheterization showed 3/3 patent grafts and the likely culprit lesion was 80% stenosis in the distal LPAV prior to bifurcation into LPL 1 and LPL 2 which was filled from retrograde flow from the SVG to OM 2 and was not approachable for PCI and medical management was recommended. Coreg was increased to 12.5 mg twice daily and Losartan increased to 50 mg daily. He was continued on ASA 81 mg daily and Crestor 20 mg daily with consideration of adding Spironolactone or an SGLT2  inhibitor as an outpatient if renal function remained stable (was at 1.05 on the day of discharge).  He was admitted to the ICU.  He continued to have hypoxia and was placed on BiPAP.  He subsequently developed hypotension requiring fluid resuscitation and vasopressor support.  UA was negative for pyuria.   Assessment/Plan: Sepsis -Present on admission -Presented with tachypnea, fever, and respiratory failure -Secondary to pneumonia -PCT 43.33>>57.55 -Follow blood cultures--neg to date -UA negative for pyuria -remains of neosynephrine 25 mcg>>weaned off 04/14/21 -continue solucortef>>wean off  Symptomatic bradycardia/Complete Heart Block -04/18/21--bradycardic into 20-30 HR with hypotension--atropine 0.5 mg x 3 -pt received metoprolol this am>>d/c -started on dopamine drip and transferred to ICU -discussed with cardiology, Dr. [name] -appreciate cardiology help -Trancutaneous pace if HR <30 -check CMP, CBC   Lobar pneumonia -Personally reviewed chest x-ray--right greater than left basilar opacity -Continue cefepime and azithromycin -MRSA screen neg -PCT 43>>57.55   Acute respiratory failure with hypoxia and hypercarbia -Secondary to pneumonia -Currently on BiPAP FiO2 0.5 -Wean back to room air as tolerated for saturation greater 90% -weaned to 4Y>>5.0P>>5W   Chronic systolic CHF/ischemic cardiomyopathy -12/17/2020 echo EF 40 to 45%, +WMA -Holding carvedilol, losartan, spironolactone secondary to hypotension -Holding furosemide temporarily   Paroxysmal Afib -transient -now in sinus with PACs -do not plan AC currently unless recurrent   Essential hypertension -Holding losartan, carvedilol, spironolactone secondary to hypotension   Coronary artery disease -No chest pain presently -Continue aspirin -Holding carvedilol, spironolactone, losartan secondary to hypotension -resume ASA and statin  Acute on chronic renal failure--CKD stage IIIa -Baseline creatinine  1.1-1.3 -In part due to hypotension and furosemide -serum creatinine peaking 2.83>>2.97>>2.54>>2.13 -renal US--no hydronephrosis   Hyperlipidemia -Continue statin   Tobacco abuse in remission -50 pk yr -quit 20 years ago   Moderate malnutrition -continue supplements  Goals of Care -updated spouse -discussed GOC -confirmed DNR -palliative consult in am       Status is: Inpatient   Remains inpatient appropriate because: hemodynamic instability, severity of illness requiring BiPAP and IV abx          The patient is critically ill with multiple organ systems failure and requires high complexity decision making for assessment and support, frequent evaluation and titration of therapies, application of advanced monitoring technologies and extensive interpretation of multiple databases.  Critical care time - 45 mins.       Family Communication:   spouse updated at bedside12/6   Consultants:  cardiology   Code Status:  FULL    DVT Prophylaxis:  [place] Heparin      Procedures: As Listed in Progress Note Above   Antibiotics: Cefepime 12/1>> Azithro 12/1>>    Subjective: Patient was diaphoretic.  Denies cp, sob, n/v.  Has nonproductive cough  Objective: Vitals:   04/18/21 0511 04/18/21 0530 04/18/21 0900 04/18/21 1248  BP: (!) 155/50   122/73  Pulse: 85   77  Resp: 18   20  Temp: (!) 97.2 F (36.2 C)   98.2 F (36.8 C)  TempSrc: Oral   Oral  SpO2: 94%  94% 96%  Weight:  75.2 kg    Height:  5\' 4"  (1.626 m)      Intake/Output Summary (Last 24 hours) at 04/18/2021 1518 Last data filed at 04/18/2021 1300 Gross per 24 hour  Intake 880 ml  Output 700 ml  Net 180 ml   Weight change: -1.6 kg Exam:  General:  Pt is alert, follows commands appropriately, not in acute distress HEENT: No icterus, No thrush, No neck mass, /AT Cardiovascular: RRR, S1/S2, no rubs, no gallops Respiratory: bilateral rales. No wheeze Abdomen: Soft/+BS, non tender, non distended, no  guarding Extremities: 1 + LE edema, No lymphangitis, No petechiae, No rashes, no synovitis   Data Reviewed: I have personally reviewed following labs and imaging studies Basic Metabolic Panel: Recent Labs  Lab 04/13/21 0142 04/14/21 0500 04/15/21 0528 04/16/21 0543 04/17/21 1415 04/18/21 0639  NA 137 136 138 140 134* 137  K 4.0 5.1 4.5 3.9 4.6 4.4  CL 103 105 106 107 104 106  CO2 25 19* 24 24 24 24   GLUCOSE 109* 118* 168* 126* 287* 235*  BUN 31* 48* 61* 68* 65* 65*  CREATININE 2.12* 2.83* 2.97* 2.54* 2.13* 2.05*  CALCIUM 8.5* 8.0* 8.2* 8.1* 8.1* 8.3*  MG 1.4*  --   --  2.3  --   --   PHOS 3.4  --   --   --   --   --    Liver Function Tests: Recent Labs  Lab 04/12/21 1453 04/14/21 0500  AST 42* 33  ALT 36 26  ALKPHOS 72 63  BILITOT 2.3* 0.8  PROT 6.4* 5.7*  ALBUMIN 3.7 3.0*   No results for input(s): LIPASE, AMYLASE in the last 168 hours. No results for input(s): AMMONIA in the last 168 hours. Coagulation Profile: No results for input(s): INR, PROTIME in the last 168 hours. CBC: Recent Labs  Lab 04/12/21 1453 04/12/21 2316 04/14/21 0500 04/15/21 0528 04/16/21 0543 04/17/21 1415 04/18/21 7741  WBC 5.9   < > 11.6* 6.0 6.3 4.1 7.6  NEUTROABS 2.5  --  5.1  --   --   --   --   HGB 13.8   < > 12.9* 12.4* 12.1* 13.0 11.9*  HCT 42.8   < > 42.2 40.5 38.9* 40.2 37.3*  MCV 94.7   < > 98.1 97.1 94.2 93.3 92.6  PLT 86*   < > 79* 64* 74* 84* 89*   < > = values in this interval not displayed.   Cardiac Enzymes: No results for input(s): CKTOTAL, CKMB, CKMBINDEX, TROPONINI in the last 168 hours. BNP: Invalid input(s): POCBNP CBG: Recent Labs  Lab 04/17/21 1617 04/17/21 2112 04/18/21 0721 04/18/21 1104 04/18/21 1456  GLUCAP 320* 391* 218* 243* 280*   HbA1C: No results for input(s): HGBA1C in the last 72 hours. Urine analysis:    Component Value Date/Time   COLORURINE YELLOW 04/13/2021 0348   APPEARANCEUR CLEAR 04/13/2021 0348   LABSPEC 1.025 04/13/2021  0348   PHURINE 5.0 04/13/2021 0348   GLUCOSEU NEGATIVE 04/13/2021 0348   HGBUR SMALL (A) 04/13/2021 0348   BILIRUBINUR NEGATIVE 04/13/2021 0348   KETONESUR NEGATIVE 04/13/2021 0348   PROTEINUR 100 (A) 04/13/2021 0348   NITRITE NEGATIVE 04/13/2021 0348   LEUKOCYTESUR SMALL (A) 04/13/2021 0348   Sepsis Labs: @LABRCNTIP (procalcitonin:4,lacticidven:4) ) Recent Results (from the past 240 hour(s))  Resp Panel by RT-PCR (Flu A&B, Covid) Nasopharyngeal Swab     Status: None   Collection Time: 04/12/21  2:59 PM   Specimen: Nasopharyngeal Swab; Nasopharyngeal(NP) swabs in vial transport medium  Result Value Ref Range Status   SARS Coronavirus 2 by RT PCR NEGATIVE NEGATIVE Final    Comment: (NOTE) SARS-CoV-2 target nucleic acids are NOT DETECTED.  The SARS-CoV-2 RNA is generally detectable in upper respiratory specimens during the acute phase of infection. The lowest concentration of SARS-CoV-2 viral copies this assay can detect is 138 copies/mL. A negative result does not preclude SARS-Cov-2 infection and should not be used as the sole basis for treatment or other patient management decisions. A negative result may occur with  improper specimen collection/handling, submission of specimen other than nasopharyngeal swab, presence of viral mutation(s) within the areas targeted by this assay, and inadequate number of viral copies(<138 copies/mL). A negative result must be combined with clinical observations, patient history, and epidemiological information. The expected result is Negative.  Fact Sheet for Patients:  EntrepreneurPulse.com.au  Fact Sheet for Healthcare Providers:  IncredibleEmployment.be  This test is no t yet approved or cleared by the [place] FDA and  has been authorized for detection and/or diagnosis of SARS-CoV-2 by FDA under an Emergency Use Authorization (EUA). This EUA will remain  in effect (meaning this test can be used)  for the duration of the COVID-19 declaration under Section 564(b)(1) of the Act, 21 U.S.C.section 360bbb-3(b)(1), unless the authorization is terminated  or revoked sooner.       Influenza A by PCR NEGATIVE NEGATIVE Final   Influenza B by PCR NEGATIVE NEGATIVE Final    Comment: (NOTE) The Xpert Xpress SARS-CoV-2/FLU/RSV plus assay is intended as an aid in the diagnosis of influenza from Nasopharyngeal swab specimens and should not be used as a sole basis for treatment. Nasal washings and aspirates are unacceptable for Xpert Xpress SARS-CoV-2/FLU/RSV testing.  Fact Sheet for Patients: EntrepreneurPulse.com.au  Fact Sheet for Healthcare Providers: IncredibleEmployment.be  This test is not yet approved or cleared by the [place] FDA and has been authorized for detection and/or  diagnosis of SARS-CoV-2 by FDA under an Emergency Use Authorization (EUA). This EUA will remain in effect (meaning this test can be used) for the duration of the COVID-19 declaration under Section 564(b)(1) of the Act, 21 U.S.C. section 360bbb-3(b)(1), unless the authorization is terminated or revoked.  Performed at [facility], [address] [place], [place] [address]   MRSA Next Gen by PCR, Nasal     Status: None   Collection Time: 04/12/21  7:50 PM   Specimen: Nasal Mucosa; Nasal Swab  Result Value Ref Range Status   MRSA by PCR Next Gen NOT DETECTED NOT DETECTED Final    Comment: (NOTE) The GeneXpert MRSA Assay (FDA approved for NASAL specimens only), is one component of a comprehensive MRSA colonization surveillance program. It is not intended to diagnose MRSA infection nor to guide or monitor treatment for MRSA infections. Test performance is not FDA approved in patients less than 82 years old. Performed at [facility], [address] [place], [place] [address]   Urine Culture     Status: Abnormal   Collection Time: 04/13/21  3:48 AM   Specimen: Urine,  Clean Catch  Result Value Ref Range Status   Specimen Description   Final    URINE, CLEAN CATCH Performed at [facility], [address] [place], [place] [address]    Special Requests   Final    NONE Performed at [facility], [address], [place], [place] [address]    Culture MULTIPLE SPECIES PRESENT, SUGGEST RECOLLECTION (A)  Final   Report Status 04/14/2021 FINAL  Final  Culture, blood (Routine X 2) w Reflex to ID Panel     Status: None   Collection Time: 04/13/21  4:18 AM   Specimen: Right Antecubital; Blood  Result Value Ref Range Status   Specimen Description RIGHT ANTECUBITAL  Final   Special Requests   Final    BOTTLES DRAWN AEROBIC AND ANAEROBIC Blood Culture adequate volume   Culture   Final    NO GROWTH 5 DAYS Performed at [facility], [address], [place], [place] [address]    Report Status 04/18/2021 FINAL  Final  Culture, blood (Routine X 2) w Reflex to ID Panel     Status: None   Collection Time: 04/13/21  4:18 AM   Specimen: BLOOD RIGHT HAND  Result Value Ref Range Status   Specimen Description BLOOD RIGHT HAND  Final   Special Requests   Final    BOTTLES DRAWN AEROBIC AND ANAEROBIC Blood Culture adequate volume   Culture   Final    NO GROWTH 5 DAYS Performed at [facility], [address] [place], [place] [address]    Report Status 04/18/2021 FINAL  Final     Scheduled Meds:  aspirin EC  81 mg Oral Daily   azithromycin  500 mg Oral Daily   Chlorhexidine Gluconate Cloth  6 each Topical Daily   feeding supplement  237 mL Oral BID BM   mouth rinse  15 mL Mouth Rinse BID   predniSONE  20 mg Oral Q breakfast   simvastatin  40 mg Oral q1800   Continuous Infusions:  sodium chloride 250 mL (04/13/21 0915)   ceFEPime (MAXIPIME) IV 2 g (04/18/21 0813)    Procedures/Studies: NM Pulmonary Perfusion  Result Date: 04/13/2021 CLINICAL DATA:  Respiratory failure. Acute hypoxic respiratory failure. EXAM: NUCLEAR MEDICINE PERFUSION LUNG SCAN TECHNIQUE:  Perfusion images were obtained in multiple projections after intravenous injection of radiopharmaceutical. Ventilation scans intentionally deferred if perfusion  scan and chest x-ray adequate for interpretation during COVID 19 epidemic. RADIOPHARMACEUTICALS:  4.2 mCi Tc-66m MAA IV COMPARISON:  Chest radiography same day FINDINGS: No evidence of segmental or subsegmental perfusion anomalies. Small amount of fluid seen in the fissures. Findings PICC low probability of pulmonary emboli. IMPRESSION: No segmental or subsegmental perfusion anomalies. Fissure signs which could be seen with small effusions/early congestive heart failure. Study predicts low probability of pulmonary emboli. Electronically Signed   By: Nelson Chimes M.D.   On: 04/13/2021 13:36   US RENAL  Result Date: 04/14/2021 CLINICAL DATA:  Renal dysfunction EXAM: RENAL / URINARY TRACT ULTRASOUND COMPLETE COMPARISON:  CT done on 11/04/2020 FINDINGS: Right Kidney: Renal measurements: 9.8 x 5.3 x 3.9 cm = volume: 105.1 mL. There is no hydronephrosis. There is interval resolution of right hydronephrosis since the CT done on 11/04/2020. There are multiple smooth marginated anechoic lesions in the right kidney largest measuring 4.9 cm in the lower pole. Left Kidney: Renal measurements: 10.6 x 4.6 x 5.6 cm = volume: 141.4 mL. There is mild lobulation in the margin. Technologist observed 7 mm hyperechoic structure in the upper pole which was not distinctly seen in the previous CT and may be a partial volume averaging artifact. There is possible 10 mm cyst in the lower pole. Left adrenal is not visualized in the submitted images. Bladder: Urinary bladder is not distended. Other: None. IMPRESSION: There is no hydronephrosis.  Bilateral renal cysts. Electronically Signed   By: Elmer Picker M.D.   On: 04/14/2021 16:16   DG Chest Port 1 View  Result Date: 04/18/2021 CLINICAL DATA:  Increased shortness of breath EXAM: PORTABLE CHEST 1 VIEW COMPARISON:   04/13/2021 FINDINGS: Unchanged mild-to-moderate cardiomegaly, with mild central vascular fullness. No definite edema. Status post median sternotomy and CABG. Left chest cardiac device, with unchanged position of the generator or wires. Slightly increased opacities in the retrocardiac left lower lobe and right lung base. Unchanged small right pleural effusion. No left pleural effusion. Remote right rib fracture.  No acute osseous abnormality. IMPRESSION: Slightly increased opacities in the lung bases, which are nonspecific but concerning for infection. Stable right pleural effusion and cardiomegaly. Electronically Signed   By: Merilyn Baba M.D.   On: 04/18/2021 13:31   DG CHEST PORT 1 VIEW  Result Date: 04/13/2021 CLINICAL DATA:  Difficulty breathing. EXAM: PORTABLE CHEST 1 VIEW COMPARISON:  Portable chest yesterday at 3:09 p.m. FINDINGS: 4:47 a.m., 04/13/2021. There is mild-to-moderate cardiomegaly. Mild central vascular fullness continues to be noted without appreciable edema. Sternotomy and CABG changes are redemonstrated as well as a single lead left chest cardiac assist device and wire insertion. There is increased streaky atelectasis or infiltrate in the retrocardiac left lower lobe and increased patchy opacities in the right lung base concerning for pneumonia with a small underlying right pleural effusion. Remaining lung fields remain clear. No other interval changes. Osteopenia. IMPRESSION: Increasing opacity in the left infrahilar retrocardiac lower lobe and right lung base concerning for pneumonia with worsening. Stable small right pleural effusion. Cardiomegaly. Electronically Signed   By: Telford Nab M.D.   On: 04/13/2021 05:49   DG Chest Port 1 View  Result Date: 04/12/2021 CLINICAL DATA:  SOB EXAM: PORTABLE CHEST 1 VIEW COMPARISON:  August 2022 FINDINGS: Implantable cardiac device with stable position of the lead. The heart appears within normal limits for size. Probable small right pleural  effusion. No left pleural effusion. No pneumothorax. Heterogeneous right lower lung airspace opacities. Osteopenia with healing/chronic right rib  fractures, present on previous exam. Median sternotomy wires. IMPRESSION: There are new right lower lung heterogeneous airspace opacities with small right pleural effusion. Electronically Signed   By: Albin Felling M.D.   On: 04/12/2021 15:17    Orson Eva, DO  [facility]  If 7PM-7AM, please contact night-coverage [email or web address] Password [email or web address] 04/18/2021, 3:18 PM   LOS: 6 days

## 2021-04-18 NOTE — Progress Notes (Addendum)
Progress Note  Patient Name: [name] Date of Encounter: 04/18/2021  [facility] Cardiologist: Rozann Lesches, MD   Subjective   On going cough, SOB   Inpatient Medications    Scheduled Meds:  aspirin EC  81 mg Oral Daily   azithromycin  500 mg Oral Daily   Chlorhexidine Gluconate Cloth  6 each Topical Daily   feeding supplement  237 mL Oral BID BM   mouth rinse  15 mL Mouth Rinse BID   predniSONE  20 mg Oral Q breakfast   simvastatin  40 mg Oral q1800   Continuous Infusions:  sodium chloride 250 mL (04/13/21 0915)   ceFEPime (MAXIPIME) IV 2 g (04/18/21 0813)   PRN Meds: acetaminophen **OR** acetaminophen, guaiFENesin-dextromethorphan, ipratropium-albuterol, loperamide, prochlorperazine   Vital Signs    Vitals:   04/18/21 0219 04/18/21 0511 04/18/21 0530 04/18/21 0900  BP: 137/90 (!) 155/50    Pulse: 89 85    Resp: 18 18    Temp: 98.7 F (37.1 C) (!) 97.2 F (36.2 C)    TempSrc:  Oral    SpO2: 94% 94%  94%  Weight:   75.2 kg   Height:   5\' 4"  (1.626 m)     Intake/Output Summary (Last 24 hours) at 04/18/2021 1033 Last data filed at 04/18/2021 0900 Gross per 24 hour  Intake 880 ml  Output 700 ml  Net 180 ml   Last 3 Weights 04/18/2021 04/17/2021 04/16/2021  Weight (lbs) 165 lb 12.6 oz 169 lb 5 oz 167 lb 5.3 oz  Weight (kg) 75.2 kg 76.8 kg 75.9 kg      Telemetry    SR, short run svt - Personally Reviewed  ECG    N/a - Personally Reviewed  Physical Exam   GEN: No acute distress.   Neck: No JVD Cardiac: RRR, no murmurs, rubs, or gallops.  Respiratory: bilateral wheezing GI: Soft, nontender, non-distended  MS: No edema; No deformity. Neuro:  Nonfocal  Psych: Normal affect   Labs    High Sensitivity Troponin:   Recent Labs  Lab 04/12/21 2143 04/12/21 2316 04/13/21 0142 04/13/21 0418  TROPONINIHS 162* 184* 189* 221*     Chemistry Recent Labs  Lab 04/12/21 1453 04/12/21 2316 04/13/21 0142 04/14/21 0500 04/15/21 0528  04/16/21 0543 04/17/21 1415 04/18/21 0639  NA 137   < > 137 136   < > 140 134* 137  K 3.7   < > 4.0 5.1   < > 3.9 4.6 4.4  CL 101   < > 103 105   < > 107 104 106  CO2 26   < > 25 19*   < > 24 24 24   GLUCOSE 209*   < > 109* 118*   < > 126* 287* 235*  BUN 24*   < > 31* 48*   < > 68* 65* 65*  CREATININE 1.53*   < > 2.12* 2.83*   < > 2.54* 2.13* 2.05*  CALCIUM 9.0   < > 8.5* 8.0*   < > 8.1* 8.1* 8.3*  MG  --   --  1.4*  --   --  2.3  --   --   PROT 6.4*  --   --  5.7*  --   --   --   --   ALBUMIN 3.7  --   --  3.0*  --   --   --   --   AST 42*  --   --  33  --   --   --   --   ALT 36  --   --  26  --   --   --   --   ALKPHOS 72  --   --  63  --   --   --   --   BILITOT 2.3*  --   --  0.8  --   --   --   --   GFRNONAA 42*   < > 29* 20*   < > 23* 28* 30*  ANIONGAP 10   < > 9 12   < > 9 6 7    < > = values in this interval not displayed.    Lipids No results for input(s): CHOL, TRIG, HDL, LABVLDL, LDLCALC, CHOLHDL in the last 168 hours.  Hematology Recent Labs  Lab 04/16/21 0543 04/17/21 1415 04/18/21 0639  WBC 6.3 4.1 7.6  RBC 4.13* 4.31 4.03*  HGB 12.1* 13.0 11.9*  HCT 38.9* 40.2 37.3*  MCV 94.2 93.3 92.6  MCH 29.3 30.2 29.5  MCHC 31.1 32.3 31.9  RDW 13.9 13.9 13.9  PLT 74* 84* 89*   Thyroid No results for input(s): TSH, FREET4 in the last 168 hours.  BNP Recent Labs  Lab 04/12/21 1453  BNP 1,568.0*    DDimer No results for input(s): DDIMER in the last 168 hours.   Radiology    No results found.  Cardiac Studies    Patient Profile     87 y.o. male  CAD (s/p CABG in 2006 with LIMA-LAD, SVG-LCx and SVG-dRCA, repeat cath in 12/2020 showing 3/3 patent grafts and likely culprit was 80% LPAV and medical management recommended), HFrEF/ICM (EF at 45-50% in 2016, at 40-45% by repeat echo in 12/2020), [employer] ICD at [facility] (previously followed by Dr. Lovena Le and at [facility] in 2016 and not replaced due to improvement in his EF), history of prostate cancer, nephrolithiasis, MDS, HTN  and HLD who presented with septic shock secondary to pneumonia. Cardiology was consulted for Afib and CHF.   Assessment & Plan    1.Pneumonia/Sepsis - per primary team. Transiently required pressors.   2. Afib - transient episode in setting of pneumonia and sepsis - no prior history of afib, given transient episode in setting of acute systetmic illness has not been committed to anticoag. - will need outpatient 30 day monitor  3. Chronic systolic HF - actually presented hypovolemic - home meds held in setting of hypotension and sepsis - bps improved today. Significant wheezing, change his home coreg to toprol starting at 50mg  daily.  Continue to hold losartan and aldactone given AKI.  - mild edema, JVD appears to be elevated. Repeat CXR to see if he has developed any significant pulmonary edema from IVFs this admit  4. CAD with prior CABG -  S/p CABG in 2006 with LIMA-LAD, SVG-LCx and SVG-dRCA, repeat cath in 12/2020 showing 3/3 patent grafts and likely culprit was 80% LPAV and medical management recommended. No anginal symptoms - continue ASA, statin.   5. AKI - per primary team    For questions or updates, please contact [place] Please consult [email or web address] for contact info under        Signed, Carlyle Dolly, MD  04/18/2021, 10:33 AM

## 2021-04-18 NOTE — Care Management Important Message (Signed)
Important Message  Patient Details  Name: [name] MRN: [id number] Date of Birth: 1926/09/10   Medicare Important Message Given:  Yes     Tommy Medal 04/18/2021, 12:57 PM

## 2021-04-18 NOTE — Progress Notes (Signed)
1453: Rapid Response called. Pt diaphoretic with heart rate of 20/min. Awake and answering questions. Blood sugar 280 mg/dl. Staff has paged MD Tat. Unable to obtain b/p. 1455: MD Tat to bedside. HR remains 20 -25/min per tele, resp 20/min, SaO2 94% on room air, automatic b/p reads 99/46. 1458: Pt had brief episode of unresponsiveness, Atropine 0.5mg  IV given per order. MD able to palpate carotid pulse during event. Pt then responding to sternal rub, answering questions. HR remains 20-24/min per tele.  1502: B/P check 119/28, HR still 28-30 per tele, pt remains diaphoretic. 1503: Repeat dose Atropine 0.5 mg IV given per order, HR remains 28-32/min, repeat b/p 119/58 manually. 1505:HR down 23-25/min, order received to begin external pacing. Rate set at 50/min with capture noted at 68 MA, milliamps reduced to 58 with continued capture. Pt states has pain with each pacer stimulus. Pt placed on 2 lpm [place] O2 for supplementation, SaO2 94%. 1507: Manual b/p 80/56, tele shows HR at 55/min, pulse palpable. 1509: Third dose Atropine 0.5mg  IV admin'd per order. 1510: Manual b/p 100/62, heart rate 70/min palpated and confirmed with tele reading.  1514: Pt transferred via bed to ICU per monitor. Remains on external pacing at this time. MD Tat remains at bedside, has spoken with pt's wife as well who states understanding of rationale for transfer.

## 2021-04-18 NOTE — Progress Notes (Signed)
Consent obtained from Wife to proceed with PICC insertion. However, during set up for PICC insertion, patient noted to momentarily become unresponsive and asystole reading on monitor. Unresponsiveness lasted for less than 1 minute. PICC set up stopped immediately and called for assistance. External pacer pads to remain at all times. Unable to proceed with PICC insertion. Recommending CVC be placed instead. Dr. Carles Collet notified.

## 2021-04-18 NOTE — Progress Notes (Signed)
Physical Therapy Treatment Patient Details Name: [name] MRN: [id number] DOB: Nov 03, 1926 Today's Date: 04/18/2021   History of Present Illness [name] is a 87 y.o. male presents with difficulty breathing. EMS reports patient's O2 sats was 60% on room air.  He was placed on 15 L nonrebreather with sats improving to 96%. Pt admitted 11/30 with lobar pneumonia and Acute respiratory failure with hypoxia and hypercarbia. PMH: cardiomyopathy, hypertension, systolic CHF, coronary artery disease, CABG    PT Comments    Pt improving with ambulation tolerance, able to ambulate 20 ft with RW on RA, SpO2 varying 86-95% with poor waveform. Educated pt on pursed lip breathing with ambulation with good carryover. Pt tolerates BLE strengthening exercises without complaints. Pt tolerates remaining up in chair at EOS with lunch tray, on RA with Spo2 93%. Continue to progress as able.   Recommendations for follow up therapy are one component of a multi-disciplinary discharge planning process, led by the attending physician.  Recommendations may be updated based on patient status, additional functional criteria and insurance authorization.  Follow Up Recommendations  Skilled nursing-short term rehab (<3 hours/day)     Assistance Recommended at Discharge Frequent or constant Supervision/Assistance  Equipment Recommendations  None recommended by PT    Recommendations for Other Services       Precautions / Restrictions Precautions Precautions: Fall Precaution Comments: monitor O2 Restrictions Weight Bearing Restrictions: No     Mobility  Bed Mobility Overal bed mobility: Needs Assistance Bed Mobility: Supine to Sit  Supine to sit: Min guard  General bed mobility comments: slow labored movement, VC for hand placement    Transfers Overall transfer level: Needs assistance Equipment used: Rolling walker (2 wheels) Transfers: Sit to/from Stand Sit to Stand: Min guard  General  transfer comment: VC for hand placement to power to stand, increased time and reliant on UE assist    Ambulation/Gait Ambulation/Gait assistance: Min guard Gait Distance (Feet): 20 Feet Assistive device: Rolling walker (2 wheels) Gait Pattern/deviations: Step-through pattern;Decreased stride length;Trunk flexed Gait velocity: decreased  General Gait Details: step through pattern with RW, slow movement with minimal bil foot clearance, maintains trunk slightly flexed on RW, denies dizziness, dyspnea 2/4 with SpO2 86-95% educated on pursed lip breathing on RA   Stairs             Wheelchair Mobility    Modified Rankin (Stroke Patients Only)       Balance Overall balance assessment: Needs assistance  Sitting balance-Leahy Scale: Good Sitting balance - Comments: seated EOB  Standing balance support: During functional activity;Bilateral upper extremity supported;Reliant on assistive device for balance Standing balance-Leahy Scale: Poor     Cognition Arousal/Alertness: Awake/alert Behavior During Therapy: WFL for tasks assessed/performed Overall Cognitive Status: Within Functional Limits for tasks assessed     Exercises General Exercises - Lower Extremity Long Arc Quad: Seated;AROM;Strengthening;Both;15 reps Hip Flexion/Marching: Seated;AROM;Strengthening;Both;15 reps Other Exercises Other Exercises: STS, 5 reps from recliner with UE assist    General Comments        Pertinent Vitals/Pain Pain Assessment: No/denies pain    Home Living                          Prior Function            PT Goals (current goals can now be found in the care plan section) Acute Rehab PT Goals Patient Stated Goal: Return home after rehab. PT Goal Formulation: With patient/family Time For  Goal Achievement: 04/27/21 Potential to Achieve Goals: Good Progress towards PT goals: Progressing toward goals    Frequency    Min 3X/week      PT Plan Current plan remains  appropriate    Co-evaluation              AM-PAC PT "6 Clicks" Mobility   Outcome Measure  Help needed turning from your back to your side while in a flat bed without using bedrails?: A Little Help needed moving from lying on your back to sitting on the side of a flat bed without using bedrails?: A Little Help needed moving to and from a bed to a chair (including a wheelchair)?: A Little Help needed standing up from a chair using your arms (e.g., wheelchair or bedside chair)?: A Little Help needed to walk in hospital room?: A Little Help needed climbing 3-5 steps with a railing? : A Lot 6 Click Score: 17    End of Session   Activity Tolerance: Patient tolerated treatment well;Patient limited by fatigue Patient left: in chair;with call bell/phone within reach Nurse Communication: Mobility status PT Visit Diagnosis: Unsteadiness on feet (R26.81);Other abnormalities of gait and mobility (R26.89);Muscle weakness (generalized) (M62.81)     Time: 8867-7373 PT Time Calculation (min) (ACUTE ONLY): 23 min  Charges:  $Gait Training: 8-22 mins $Therapeutic Exercise: 8-22 mins                      Talbot Grumbling PT, DPT 04/18/21, 12:31 PM

## 2021-04-19 DIAGNOSIS — I442 Atrioventricular block, complete: Secondary | ICD-10-CM | POA: Diagnosis not present

## 2021-04-19 DIAGNOSIS — I25110 Atherosclerotic heart disease of native coronary artery with unstable angina pectoris: Secondary | ICD-10-CM | POA: Diagnosis not present

## 2021-04-19 DIAGNOSIS — Z515 Encounter for palliative care: Secondary | ICD-10-CM

## 2021-04-19 DIAGNOSIS — I5023 Acute on chronic systolic (congestive) heart failure: Secondary | ICD-10-CM | POA: Diagnosis not present

## 2021-04-19 DIAGNOSIS — I5041 Acute combined systolic (congestive) and diastolic (congestive) heart failure: Secondary | ICD-10-CM | POA: Diagnosis not present

## 2021-04-19 DIAGNOSIS — R001 Bradycardia, unspecified: Secondary | ICD-10-CM | POA: Diagnosis not present

## 2021-04-19 DIAGNOSIS — Z7189 Other specified counseling: Secondary | ICD-10-CM

## 2021-04-19 MED FILL — Medication: Qty: 1 | Status: AC

## 2021-04-19 NOTE — Progress Notes (Addendum)
Progress Note  Patient Name: [name] Date of Encounter: 04/19/2021  [facility] Cardiologist: Rozann Lesches, MD   Subjective   Sedated this AM  Inpatient Medications    Scheduled Meds:  aspirin EC  81 mg Oral Daily   azithromycin  500 mg Oral Daily   Chlorhexidine Gluconate Cloth  6 each Topical Daily   feeding supplement  237 mL Oral BID BM   mouth rinse  15 mL Mouth Rinse BID   predniSONE  20 mg Oral Q breakfast   simvastatin  40 mg Oral q1800   sodium zirconium cyclosilicate  10 g Oral Once   Continuous Infusions:  sodium chloride 250 mL (04/13/21 0915)   ceFEPime (MAXIPIME) IV 2 g (04/19/21 0625)   DOBUTamine 2.5 mcg/kg/min (04/18/21 1852)   DOPamine 12.5 mcg/kg/min (04/19/21 0543)   PRN Meds: acetaminophen **OR** acetaminophen, fentaNYL (SUBLIMAZE) injection, guaiFENesin-dextromethorphan, ipratropium-albuterol, loperamide, ondansetron (ZOFRAN) IV, prochlorperazine   Vital Signs    Vitals:   04/19/21 0500 04/19/21 0530 04/19/21 0600 04/19/21 0630  BP: (!) 136/44 (!) 136/42 (!) 136/41 (!) 137/42  Pulse: (!) 40 (!) 35 (!) 40 65  Resp: 14 12 15 17   Temp:      TempSrc:      SpO2: 98% 97% 90% 98%  Weight: 74.7 kg     Height:        Intake/Output Summary (Last 24 hours) at 04/19/2021 0749 Last data filed at 04/19/2021 0500 Gross per 24 hour  Intake 583.71 ml  Output 200 ml  Net 383.71 ml   Last 3 Weights 04/19/2021 04/18/2021 04/17/2021  Weight (lbs) 164 lb 10.9 oz 165 lb 12.6 oz 169 lb 5 oz  Weight (kg) 74.7 kg 75.2 kg 76.8 kg      Telemetry    Complete heart block, junctional escape - Personally Reviewed  ECG    N/a - Personally Reviewed  Physical Exam   GEN: No acute distress.   Neck: No JVD Cardiac: RRR, no murmurs, rubs, or gallops.  Respiratory: Clear to auscultation bilaterally. GI: Soft, nontender, non-distended  MS: No edema; No deformity. Neuro:  Nonfocal  Psych: sedated, not able to assess  Labs    High Sensitivity  Troponin:   Recent Labs  Lab 04/12/21 2143 04/12/21 2316 04/13/21 0142 04/13/21 0418  TROPONINIHS 162* 184* 189* 221*     Chemistry Recent Labs  Lab 04/12/21 1453 04/12/21 2316 04/13/21 0142 04/14/21 0500 04/15/21 0528 04/16/21 0543 04/17/21 1415 04/18/21 0639 04/18/21 1514  NA 137   < > 137 136   < > 140 134* 137 132*  K 3.7   < > 4.0 5.1   < > 3.9 4.6 4.4 5.2*  CL 101   < > 103 105   < > 107 104 106 101  CO2 26   < > 25 19*   < > 24 24 24  21*  GLUCOSE 209*   < > 109* 118*   < > 126* 287* 235* 350*  BUN 24*   < > 31* 48*   < > 68* 65* 65* 64*  CREATININE 1.53*   < > 2.12* 2.83*   < > 2.54* 2.13* 2.05* 2.14*  CALCIUM 9.0   < > 8.5* 8.0*   < > 8.1* 8.1* 8.3* 8.0*  MG  --   --  1.4*  --   --  2.3  --   --  2.3  PROT 6.4*  --   --  5.7*  --   --   --   --   --  ALBUMIN 3.7  --   --  3.0*  --   --   --   --   --   AST 42*  --   --  33  --   --   --   --   --   ALT 36  --   --  26  --   --   --   --   --   ALKPHOS 72  --   --  63  --   --   --   --   --   BILITOT 2.3*  --   --  0.8  --   --   --   --   --   GFRNONAA 42*   < > 29* 20*   < > 23* 28* 30* 28*  ANIONGAP 10   < > 9 12   < > 9 6 7 10    < > = values in this interval not displayed.    Lipids No results for input(s): CHOL, TRIG, HDL, LABVLDL, LDLCALC, CHOLHDL in the last 168 hours.  Hematology Recent Labs  Lab 04/17/21 1415 04/18/21 0639 04/18/21 1514  WBC 4.1 7.6 7.8  RBC 4.31 4.03* 4.43  HGB 13.0 11.9* 13.2  HCT 40.2 37.3* 41.5  MCV 93.3 92.6 93.7  MCH 30.2 29.5 29.8  MCHC 32.3 31.9 31.8  RDW 13.9 13.9 14.0  PLT 84* 89* 127*   Thyroid No results for input(s): TSH, FREET4 in the last 168 hours.  BNP Recent Labs  Lab 04/12/21 1453 04/18/21 0639  BNP 1,568.0* 2,074.0*    DDimer No results for input(s): DDIMER in the last 168 hours.   Radiology    DG Chest Port 1 View  Result Date: 04/18/2021 CLINICAL DATA:  Increased shortness of breath EXAM: PORTABLE CHEST 1 VIEW COMPARISON:  04/13/2021  FINDINGS: Unchanged mild-to-moderate cardiomegaly, with mild central vascular fullness. No definite edema. Status post median sternotomy and CABG. Left chest cardiac device, with unchanged position of the generator or wires. Slightly increased opacities in the retrocardiac left lower lobe and right lung base. Unchanged small right pleural effusion. No left pleural effusion. Remote right rib fracture.  No acute osseous abnormality. IMPRESSION: Slightly increased opacities in the lung bases, which are nonspecific but concerning for infection. Stable right pleural effusion and cardiomegaly. Electronically Signed   By: Merilyn Baba M.D.   On: 04/18/2021 13:31   DG Chest Port 1V same Day  Result Date: 04/18/2021 CLINICAL DATA:  Post central line placement EXAM: PORTABLE CHEST 1 VIEW COMPARISON:  Film from earlier in the same day. FINDINGS: Cardiac shadow is enlarged. Defibrillator is again noted. Right jugular central line is noted with the tip at the cavoatrial junction. No pneumothorax is noted. Stable opacities in the bases are not seen right greater than left with small right-sided pleural effusion. IMPRESSION: No pneumothorax following central line placement. No significant interval change from the prior exam is noted. Electronically Signed   By: Inez Catalina M.D.   On: 04/18/2021 19:00   [place] EKG SITE RITE  Result Date: 04/18/2021 If Site Rite image not attached, placement could not be confirmed due to current cardiac rhythm.  [place] EKG SITE RITE  Result Date: 04/18/2021 If Site Rite image not attached, placement could not be confirmed due to current cardiac rhythm.   Cardiac Studies    Patient Profile     87 y.o. male  CAD (s/p CABG in 2006 with LIMA-LAD, SVG-LCx and SVG-dRCA, repeat  cath in 12/2020 showing 3/3 patent grafts and likely culprit was 80% LPAV and medical management recommended), HFrEF/ICM (EF at 45-50% in 2016, at 40-45% by repeat echo in 12/2020), Medtronic ICD at [facility] (previously  followed by Dr. Lovena Le and at [facility] in 2016 and not replaced due to improvement in his EF), history of prostate cancer, nephrolithiasis, MDS, HTN and HLD who presented with septic shock secondary to pneumonia. Cardiology was consulted for Afib and CHF.   Assessment & Plan    1.Pneumonia/Sepsis - per primary team. Transiently required pressors.    2. Afib - transient episode in setting of pneumonia and sepsis - no prior history of afib, given transient episode in setting of acute systetmic illness has not been committed to anticoag. - will need outpatient 30 day monitor   3. Chronic systolic HF - actually presented hypovolemic - home meds held in setting of hypotension and sepsis - bps improved today. Significant wheezing, change his home coreg to toprol starting at 50mg  daily.  Continue to hold losartan and aldactone given AKI.   -some evidence of fluid overload yesterday, given IV lasix 60mg  x 1. Incomplete I/Os, - remains volume overloaded today, with recent issues with low bp's related to his bradycardia will not diurse today.    4. CAD with prior CABG -  S/p CABG in 2006 with LIMA-LAD, SVG-LCx and SVG-dRCA, repeat cath in 12/2020 showing 3/3 patent grafts and likely culprit was 80% LPAV and medical management recommended. No anginal symptoms - continue ASA, statin.    5. AKI - per primary team  6. Sinus pauses/Asystole/Comeplete heart blck - episodes of sinus pauses, self terminating asystole, and complete heart block starting yesterday after receiving toprol 50mg  x 1. Patient had been on coreg 12.5mg  bid at home, surprising response to toprol dose.  - started on dopamine drip, significant HTN SBPs 180s and 190s, dobutamine was started to wean dopamine. Intermittently required external pacing. - this AM dopamine at 12.5, dobutamine at 5. BP's SBP in the 130s. He is in complete heart block with a junctional rhythm in 40s not yet 24 hrs out from his toprol dose. External pacer set as  back up at rate of 30, brief episodes of pacing overnight and early AM. Monitor into the afternoon today, if rates not improving would need to consider transfer to [name] for EP evaluation  - in 87 yo DNR/DNI patient with ongoing management for pneumonia not ideal pacemaker candidate, hopefullly as toprol clears system rates will improve.   Addendum Ongoing talks with palliative, family is undecided whether they would consider pacemaker given patients over all quality of life. Continue medical therapy at this time and reassess tomorrow.   For questions or updates, please contact [place] Please consult [email or web address] for contact info under        Signed, Carlyle Dolly, MD  04/19/2021, 7:49 AM

## 2021-04-19 NOTE — Consult Note (Signed)
Consultation Note Date: 04/19/2021   Patient Name: [name]  DOB: [birth year]  MRN: [id number]  Age / Sex: 87 y.o., male  PCP: Luke Squibb, MD Referring Physician: Deatra James, MD  Reason for Consultation: Establishing goals of care  HPI/Patient Profile: 87 y.o. male  with past medical history of cardiomyopathy, hypertension, systolic heart failure EF 40-45%, CAD, s/p AICD, CKD stage 3a, prostate cancer, myelodysplasia low grade, BPH admitted on 04/12/2021 with difficulty breathing with bilateral lower extremity swelling, productive cough, and with severe hypoxia improved on 6L found to have sepsis with lobar pneumonia. Hospitalization complicated by symptomatic bradycardia and complete heart block requiring atropine and dopamine infusion. Family meeting with Dr. Carles Collet 04/18/21 and family have opted for DNR status and they do not wish for him to suffer but to continue current interventions at that time.   Clinical Assessment and Goals of Care: I met today at [name] bedside initially with [name] and her son [name]. They share that [name] other son is en-route from [place]. [name] shares about her husband and how busy they stay with doctor's appointments between the two of them. They have been married almost 36 years now. Wife reports that this illness has been much more difficult than even his CABG and we discussed that the body only gets more complicated with time and more difficult to recover the more it goes through. They share how [name] was driving to [facility] to his son's home Thanksgiving and was driving to church the Sunday prior to admission. We agreed to speak more when her other son arrives to bedside.   I returned to bedside but Ms. [name] has gone home to rest and her 2 sons are at bedside. I introduced myself as palliative care and they share how difficult this has been  for them but especially their mother. They know that she just desperately wants him to improve. They had questions about a potential plan for transfer to [facility] for potential pacemaker as they were under the impression from conversations yesterday that he would not be a candidate for a pacemaker surgery. I explained to them that this often depends on the wishes/expectations of the patient and family as well as the provider. I explained that we can often do things to continue to prolong life but we must understand what this may look like and that this does not always improve to an acceptable quality of life. I express my concern for [name] potential for recovery to a good quality of life after his acute illness and now especially given his cardiac complications. They both seem to understand and do not feel like a pacemaker procedure would be in their father's best interest. They express that they do not want him to suffer. However, they would like to continue current interventions to allow every opportunity for [name] to improve if possible. We agreed to continue current care for now and we will revisit goals this evening or tomorrow morning if he is not  improving. Sons were tearful throughout the conversation and understand the severity of the situation although they still want to hold out some hope for some level of improvement.   All questions/concerns addressed. Emotional support provided. Updated RN, [name], [name].   Primary Decision Maker NEXT OF KIN wife with assistance of their adult children    SUMMARY OF RECOMMENDATIONS   - DNR established yesterday - Hold on pursuing transfer or permanent pacemaker - Ongoing goals of care conversation (continue current interventions for now)  Code Status/Advance Care Planning: DNR   Symptom Management:  Per attending and cardiology  Palliative Prophylaxis:  Aspiration, Delirium Protocol, Oral Care, and Turn Reposition  Prognosis:   Overall poor prognosis with underlying comorbidities, severity of acute illness, advanced age.   Discharge Planning: To Be Determined      Primary Diagnoses: Present on Admission:  Automatic implantable cardioverter-defibrillator in situ  Ischemic cardiomyopathy  SYSTOLIC HEART FAILURE, CHRONIC  Coronary artery disease  Essential hypertension  Acute respiratory failure with hypoxia ([place])   I have reviewed the medical record, interviewed the patient and family, and examined the patient. The following aspects are pertinent.  Past Medical History:  Diagnosis Date   BPH (benign prostatic hypertrophy)    Chronic systolic heart failure (HCC)    Coronary artery disease    a. Multivessel status post CABG 8/06, LIMA-LAD, SVG-CFX, SVG-distal RCA b. 12/2020: cath showing 3/3 patent grafts with 80% stenosis along LPAV prior to bifurication and medical management recommended.   Essential hypertension    Hyperlipidemia    ICD (implantable cardiac defibrillator) in place    Medtronic   Ischemic cardiomyopathy    Myelodysplasia, low grade ([place]) 11/27/2015   Nephrolithiasis    Prostate cancer ([place])    Vitamin B 12 deficiency 07/27/2016   Social History   Socioeconomic History   Marital status: Married    Spouse name: Not on file   Number of children: Not on file   Years of education: Not on file   Highest education level: Not on file  Occupational History   Occupation: Retired    Fish farm manager: RETIRED  Tobacco Use   Smoking status: Former    Packs/day: 0.25    Years: 44.00    Pack years: 11.00    Types: Cigarettes    Quit date: 05/14/1992    Years since quitting: 28.9   Smokeless tobacco: Never  Vaping Use   Vaping Use: Never used  Substance and Sexual Activity   Alcohol use: No   Drug use: No   Sexual activity: Not on file    Comment: married 69 years  Other Topics Concern   Not on file  Social History Narrative   Not on file   Social Determinants of Health   Financial  Resource Strain: Not on file  Food Insecurity: Not on file  Transportation Needs: Not on file  Physical Activity: Not on file  Stress: Not on file  Social Connections: Not on file   Family History  Problem Relation Age of Onset   Colon cancer Father    Coronary artery disease Other    Scheduled Meds:  aspirin EC  81 mg Oral Daily   azithromycin  500 mg Oral Daily   Chlorhexidine Gluconate Cloth  6 each Topical Daily   feeding supplement  237 mL Oral BID BM   mouth rinse  15 mL Mouth Rinse BID   predniSONE  20 mg Oral Q breakfast   simvastatin  40 mg Oral  q1800   sodium zirconium cyclosilicate  10 g Oral Once   Continuous Infusions:  sodium chloride 250 mL (04/13/21 0915)   ceFEPime (MAXIPIME) IV 2 g (04/19/21 0315)   DOBUTamine 2.5 mcg/kg/min (04/18/21 1852)   DOPamine 12.5 mcg/kg/min (04/19/21 0543)   PRN Meds:.acetaminophen **OR** acetaminophen, fentaNYL (SUBLIMAZE) injection, guaiFENesin-dextromethorphan, ipratropium-albuterol, loperamide, ondansetron (ZOFRAN) IV, prochlorperazine Allergies  Allergen Reactions   Codeine Nausea And Vomiting   Review of Systems  Unable to perform ROS: Acuity of condition   Physical Exam Vitals and nursing note reviewed.  Constitutional:      General: He is sleeping.     Appearance: He is ill-appearing.  Cardiovascular:     Rate and Rhythm: Bradycardia present.     Comments: Transcutaneous demand pacing with junctional rhythm/heart block Pulmonary:     Effort: No tachypnea, accessory muscle usage or respiratory distress.  Abdominal:     Palpations: Abdomen is soft.  Neurological:     Comments: Oriented x 1 to self; he is verbal at times but inconsistent and overall very lethargic.     Vital Signs: BP (!) 137/42   Pulse (!) 40   Temp 99.5 F (37.5 C) (Axillary)   Resp 14   Ht '5\' 4"'  (1.626 m)   Wt 74.7 kg   SpO2 99%   BMI 28.27 kg/m  Pain Scale: PAINAD   Pain Score: Asleep   SpO2: SpO2: 99 % O2 Device:SpO2: 99 % O2  Flow Rate: .O2 Flow Rate (L/min): 6 L/min  IO: Intake/output summary:  Intake/Output Summary (Last 24 hours) at 04/19/2021 0858 Last data filed at 04/19/2021 0500 Gross per 24 hour  Intake 583.71 ml  Output 200 ml  Net 383.71 ml    LBM: Last BM Date: 04/15/21 Baseline Weight: Weight: 76.2 kg Most recent weight: Weight: 74.7 kg     Palliative Assessment/Data:    Time Total: 50 min  Greater than 50%  of this time was spent counseling and coordinating care related to the above assessment and plan.  Signed by: Vinie Sill, NP Palliative Medicine Team Pager # [phone] (M-F 8a-5p) Team Phone # [phone] (Nights/Weekends)

## 2021-04-19 NOTE — Progress Notes (Addendum)
Nutrition Follow-up  DOCUMENTATION CODES:      INTERVENTION:  Ensure Enlive po BID, each supplement provides 350 kcal and 20 grams of protein    NUTRITION DIAGNOSIS:   Moderate Malnutrition related to acute illness (pneumonia) as evidenced by energy intake < 75% for > 7 days, mild muscle depletion, moderate muscle depletion.   GOAL:  Honor patient health care wishes.  MONITOR:  PO intake, Labs, changes in status  REASON FOR ASSESSMENT:   Malnutrition Screening Tool    ASSESSMENT: Patient is a 87 yo male from home with hx of CAD, CHF, Vit B-12 deficiency and prostate cancer. He presents with shortness of breath, sespsis associated with pneumonia. Acute respiratory failure and requiring 10 L HFNC.  Appetite is good at baseline. His wife of 59 years provided history. Patient started getting sick on Monday and has decreased intake since. Today he is hungry and asking for food.  Their eating pattern is 3 meals daily. She says, "we eat like pigs". He weighs daily usual is between 162-164 lb.   12/7 Pt is being shocked externally due to his pacemaker not functioning per discussion with nursing. He may transfer to [facility] are bedside and concerned about the ongoing alarms. Goals of care have been addressed by MD.    Patient has been eating very little the past few days. PO: 0-50% since 12/3. Patient intake < 75% since admission and mild to moderate muscle loss temporal, clavicle and acromion regions. Meets criteria for acute moderate malnutrition.  Intake/Output Summary (Last 24 hours) at 04/19/2021 1155 Last data filed at 04/19/2021 0500 Gross per 24 hour  Intake 583.71 ml  Output --  Net 583.71 ml     Labs: BMP Latest Ref Rng & Units 04/18/2021 04/18/2021 04/17/2021  Glucose 70 - 99 mg/dL 350(H) 235(H) 287(H)  BUN 8 - 23 mg/dL 64(H) 65(H) 65(H)  Creatinine 0.61 - 1.24 mg/dL 2.14(H) 2.05(H) 2.13(H)  BUN/Creat Ratio 6 - 22 (calc) - - -  Sodium 135 - 145 mmol/L 132(L) 137 134(L)   Potassium 3.5 - 5.1 mmol/L 5.2(H) 4.4 4.6  Chloride 98 - 111 mmol/L 101 106 104  CO2 22 - 32 mmol/L 21(L) 24 24  Calcium 8.9 - 10.3 mg/dL 8.0(L) 8.3(L) 8.1(L)      NUTRITION - FOCUSED PHYSICAL EXAM: Unable to complete Nutrition-Focused physical exam at this time.     Diet Order:   Diet Order             Diet Heart Room service appropriate? Yes; Fluid consistency: Thin  Diet effective now                   EDUCATION NEEDS:  Not appropriate for education at this time  Skin:  Skin Assessment: Reviewed RN Assessment (skin tear to left arm)  Last BM:  12/3  Height:   Ht Readings from Last 1 Encounters:  04/18/21 5\' 4"  (1.626 m)    Weight:   Wt Readings from Last 1 Encounters:  04/19/21 74.7 kg    Ideal Body Weight:   55 kg  BMI:  Body mass index is 28.27 kg/m.  Estimated Nutritional Needs:   Kcal:  1800-1900  Protein:  86-94 gr  Fluid:  < 2liters daily   Colman Cater MS,RD,CSG,LDN Contact: AMION

## 2021-04-19 NOTE — Progress Notes (Signed)
PROGRESS NOTE  [name] HBZ:[id number] DOB: 1926/06/06 DOA: 04/12/2021 PCP: [name], MD   Subjective: The patient was seen and examined this morning, lethargic, on dopamine drip still bradycardic heart rate 38. Seems comfortable, did not wake up on examination,  Patient wife and his son was present at bedside.  Yesterday patient had an event of bradycardia, started on dopamine drip, cardiology was consulted Currently on dopamine drip, heart rate still 38-40s blood pressure stable      Brief History:  87 year old male with a history of coronary artery disease status post CABG, systolic CHF with EF 10-17%, AICD, prostate cancer, MDS, hypertension, hyperlipidemia presenting with shortness of breath of 1 week duration.  History is supplemented by the patient's wife.  Apparently his shortness of breath continue to worsen over the past 2 to 3 days prior to admission.  He had a cough with cream-colored sputum.  There is no hemoptysis, fevers, chills, chest pain, nausea, vomiting, diarrhea, abdominal pain.  The patient has bilateral lower extremity edema which appears to be chronic.  Spouse states that his edema is actually a little bit better than usual.  Upon EMS activation.  The patient was noted to have oxygen saturation of 60% on room air.  He was placed on nonrebreather.  In the emergency department, patient was initially afebrile and hemodynamically stable.  He was placed on 6 L with oxygen saturation in the low 90s.  Chest x-ray showed bibasilar opacities, regular in the left.  BMP showed sodium sodium 137, potassium 3.7, bicarbonate 26, serum creatinine 1.53.  WBC 5.9, hemoglobin 13.8, platelets 86,000.   Notably, the patient had a hospital admission from 12/17/2020 to 12/20/2020 for chest pain concerning for unstable angina.  He was started on IV heparin.  He underwent cardiac catheterization.  . Repeat catheterization showed 3/3 patent grafts and the likely culprit lesion  was 80% stenosis in the distal LPAV prior to bifurcation into LPL 1 and LPL 2 which was filled from retrograde flow from the SVG to OM 2 and was not approachable for PCI and medical management was recommended. Coreg was increased to 12.5 mg twice daily and Losartan increased to 50 mg daily. He was continued on ASA 81 mg daily and Crestor 20 mg daily with consideration of adding Spironolactone or an SGLT2 inhibitor as an outpatient if renal function remained stable (was at 1.05 on the day of discharge).  He was admitted to the ICU.  He continued to have hypoxia and was placed on BiPAP.  He subsequently developed hypotension requiring fluid resuscitation and vasopressor support.  UA was negative for pyuria.   Assessment/Plan:    Sepsis -Improved sepsis physiology with exception of bradycardia -On admission met sepsis criteria - with tachypnea, fever, and respiratory failure -Secondary to pneumonia -PCT 43.33>>57.55 -Follow blood cultures--neg to date -UA negative for pyuria -remains of neosynephrine 25 mcg>>weaned off 04/14/21 -continue solucortef>>wean off -As cultures remain negative, anticipating discontinuing antibiotics  Symptomatic bradycardia/Complete Heart Block (asystole/sinus pause) With history of paroxysmal atrial fibrillation (not on chronic anticoagulation therapy -04/18/21--bradycardic into 20-30 HR with hypotension--atropine 0.5 mg x 3 -pt received metoprolol this am>>d/c -started on dopamine drip and transferred to ICU -Cardiology, Dr. [name]... Continuing dopamine drip at 12.5 today, and dobutamine External pacemaker in place -brief episode of pacing overnight and early this morning Cardiology recommending if no improvement to transfer to [name] for EP evaluation     Lobar pneumonia -Improving -  Personally reviewed chest x-ray--right greater than left basilar opacity -Continue cefepime and azithromycin -MRSA screen neg -PCT 43>>57.55   Acute respiratory failure  with hypoxia and hypercarbia -Secondary to pneumonia --much improved -Currently on BiPAP FiO2 0.5 -Wean back to room air as tolerated for saturation greater 90% -weaned to 2Z>>3.6U>>4Q   Chronic systolic CHF/ischemic cardiomyopathy -12/17/2020 echo EF 40 to 45%, +WMA -Holding carvedilol, losartan, spironolactone secondary to hypotension -Holding furosemide temporarily     Essential hypertension -Holding losartan, carvedilol, spironolactone secondary to hypotension -Blood pressure has stabilized--on dopamine drip   Coronary artery disease -No chest pain presently -Continue aspirin -Holding carvedilol, spironolactone, losartan secondary to hypotension -resume ASA and statin   Acute on chronic renal failure--CKD stage IIIa -Baseline creatinine 1.1-1.3 -In part due to hypotension and furosemide -serum creatinine peaking 2.83>>2.97>>2.54>>2.13 -renal US--no hydronephrosis   Hyperlipidemia -Continue statin   Tobacco abuse in remission -50 pk yr -quit 20 years ago   Moderate malnutrition -continue supplements  Goals of Care -updated spouse -discussed GOC -confirmed DNR -palliative consult in am       Status is: Inpatient   Remains inpatient appropriate because: hemodynamic instability, severity of illness requiring BiPAP and IV abx, requiring IV medication for severe symptomatic bradycardia          The patient is critically ill with multiple organ systems failure and requires high complexity decision making for assessment and support, frequent evaluation and titration of therapies, application of advanced monitoring technologies and extensive interpretation of multiple databases.  Critical care time - 45 mins.       Family Communication:   spouse updated at bedside12/6   Consultants:  cardiology   Code Status:  FULL    DVT Prophylaxis:  [place] Heparin      Procedures: As Listed in Progress Note Above   Antibiotics: Cefepime 12/1>> Azithro  12/1>>     Objective: Vitals:   04/19/21 0800 04/19/21 0900 04/19/21 1000 04/19/21 1125  BP: (!) 130/39 (!) 133/39 (!) 130/42   Pulse: (!) 41 (!) 40 (!) 40 (!) 38  Resp: _0 Temp:    98.3 F (36.8 C)  TempSrc:    Oral  SpO2: 99% 99% 98% 99%  Weight:      Height:        Intake/Output Summary (Last 24 hours) at 04/19/2021 1201 Last data filed at 04/19/2021 0500 Gross per 24 hour  Intake 583.71 ml  Output --  Net 583.71 ml   Weight change: -0.5 kg    Physical Exam:   General:  Lethargic-comfortable in bed  HEENT:  Normocephalic, PERRL, otherwise with in Normal limits   Neuro:  CNII-XII intact. , normal motor and sensation, reflexes intact   Lungs:   Clear to auscultation BL, Respirations unlabored, no wheezes / crackles  Cardio:    S1/S2, bradycardic on dopamine, no murmure, No Rubs or Gallops   Abdomen:   Soft, non-tender, bowel sounds active all four quadrants,  no guarding or peritoneal signs.  Muscular skeletal:  Limited exam - in bed, able to move all 4 extremities, Normal strength,  2+ pulses,  symmetric, No pitting edema  Skin:  Dry, warm to touch, negative for any Rashes,  Wounds: Please see nursing documentation         Reviewed: I have personally reviewed following labs and imaging studies Basic Metabolic Panel: Recent Labs  Lab 04/13/21 0142 04/14/21 0500 04/15/21 0528 04/16/21 0543 04/17/21 1415 04/18/21 0639 04/18/21 1514  NA 137   < >  138 140 134* 137 132*  K 4.0   < > 4.5 3.9 4.6 4.4 5.2*  CL 103   < > 106 107 104 106 101  CO2 25   < > _0 21*  GLUCOSE 109*   < > 168* 126* 287* 235* 350*  BUN 31*   < > 61* 68* 65* 65* 64*  CREATININE 2.12*   < > 2.97* 2.54* 2.13* 2.05* 2.14*  CALCIUM 8.5*   < > 8.2* 8.1* 8.1* 8.3* 8.0*  MG 1.4*  --   --  2.3  --   --  2.3  PHOS 3.4  --   --   --   --   --   --    < > = values in this interval not displayed.   Liver Function Tests: Recent Labs  Lab 04/12/21 1453 04/14/21 0500  AST  42* 33  ALT 36 26  ALKPHOS 72 63  BILITOT 2.3* 0.8  PROT 6.4* 5.7*  ALBUMIN 3.7 3.0*   No results for input(s): LIPASE, AMYLASE in the last 168 hours. No results for input(s): AMMONIA in the last 168 hours. Coagulation Profile: No results for input(s): INR, PROTIME in the last 168 hours. CBC: Recent Labs  Lab 04/12/21 1453 04/12/21 2316 04/14/21 0500 04/15/21 0528 04/16/21 0543 04/17/21 1415 04/18/21 0639 04/18/21 1514  WBC 5.9   < > 11.6* 6.0 6.3 4.1 7.6 7.8  NEUTROABS 2.5  --  5.1  --   --   --   --   --   HGB 13.8   < > 12.9* 12.4* 12.1* 13.0 11.9* 13.2  HCT 42.8   < > 42.2 40.5 38.9* 40.2 37.3* 41.5  MCV 94.7   < > 98.1 97.1 94.2 93.3 92.6 93.7  PLT 86*   < > 79* 64* 74* 84* 89* 127*   < > = values in this interval not displayed.   Cardiac Enzymes: No results for input(s): CKTOTAL, CKMB, CKMBINDEX, TROPONINI in the last 168 hours. BNP: Invalid input(s): POCBNP CBG: Recent Labs  Lab 04/17/21 1617 04/17/21 2112 04/18/21 0721 04/18/21 1104 04/18/21 1456  GLUCAP 320* 391* 218* 243* 280*   HbA1C: No results for input(s): HGBA1C in the last 72 hours. Urine analysis:    Component Value Date/Time   COLORURINE YELLOW 04/13/2021 0348   APPEARANCEUR CLEAR 04/13/2021 0348   LABSPEC 1.025 04/13/2021 0348   PHURINE 5.0 04/13/2021 0348   GLUCOSEU NEGATIVE 04/13/2021 0348   HGBUR SMALL (A) 04/13/2021 0348   BILIRUBINUR NEGATIVE 04/13/2021 0348   KETONESUR NEGATIVE 04/13/2021 0348   PROTEINUR 100 (A) 04/13/2021 0348   NITRITE NEGATIVE 04/13/2021 0348   LEUKOCYTESUR SMALL (A) 04/13/2021 0348   Sepsis Labs: _1 (procalcitonin:4,lacticidven:4) ) Recent Results (from the past 240 hour(s))  Resp Panel by RT-PCR (Flu A&B, Covid) Nasopharyngeal Swab     Status: None   Collection Time: 04/12/21  2:59 PM   Specimen: Nasopharyngeal Swab; Nasopharyngeal(NP) swabs in vial transport medium  Result Value Ref Range Status   SARS Coronavirus 2 by RT PCR NEGATIVE NEGATIVE  Final    Comment: (NOTE) SARS-CoV-2 target nucleic acids are NOT DETECTED.  The SARS-CoV-2 RNA is generally detectable in upper respiratory specimens during the acute phase of infection. The lowest concentration of SARS-CoV-2 viral copies this assay can detect is 138 copies/mL. A negative result does not preclude SARS-Cov-2 infection and should not be used as the sole basis for treatment or other patient management decisions. A negative result  may occur with  improper specimen collection/handling, submission of specimen other than nasopharyngeal swab, presence of viral mutation(s) within the areas targeted by this assay, and inadequate number of viral copies(<138 copies/mL). A negative result must be combined with clinical observations, patient history, and epidemiological information. The expected result is Negative.  Fact Sheet for Patients:  EntrepreneurPulse.com.au  Fact Sheet for Healthcare Providers:  IncredibleEmployment.be  This test is no t yet approved or cleared by the [place] FDA and  has been authorized for detection and/or diagnosis of SARS-CoV-2 by FDA under an Emergency Use Authorization (EUA). This EUA will remain  in effect (meaning this test can be used) for the duration of the COVID-19 declaration under Section 564(b)(1) of the Act, 21 U.S.C.section 360bbb-3(b)(1), unless the authorization is terminated  or revoked sooner.       Influenza A by PCR NEGATIVE NEGATIVE Final   Influenza B by PCR NEGATIVE NEGATIVE Final    Comment: (NOTE) The Xpert Xpress SARS-CoV-2/FLU/RSV plus assay is intended as an aid in the diagnosis of influenza from Nasopharyngeal swab specimens and should not be used as a sole basis for treatment. Nasal washings and aspirates are unacceptable for Xpert Xpress SARS-CoV-2/FLU/RSV testing.  Fact Sheet for Patients: EntrepreneurPulse.com.au  Fact Sheet for Healthcare  Providers: IncredibleEmployment.be  This test is not yet approved or cleared by the [place] FDA and has been authorized for detection and/or diagnosis of SARS-CoV-2 by FDA under an Emergency Use Authorization (EUA). This EUA will remain in effect (meaning this test can be used) for the duration of the COVID-19 declaration under Section 564(b)(1) of the Act, 21 U.S.C. section 360bbb-3(b)(1), unless the authorization is terminated or revoked.  Performed at [facility], [address] [place], [place] [address]   MRSA Next Gen by PCR, Nasal     Status: None   Collection Time: 04/12/21  7:50 PM   Specimen: Nasal Mucosa; Nasal Swab  Result Value Ref Range Status   MRSA by PCR Next Gen NOT DETECTED NOT DETECTED Final    Comment: (NOTE) The GeneXpert MRSA Assay (FDA approved for NASAL specimens only), is one component of a comprehensive MRSA colonization surveillance program. It is not intended to diagnose MRSA infection nor to guide or monitor treatment for MRSA infections. Test performance is not FDA approved in patients less than 54 years old. Performed at [facility], [address], [place], [place] [address]   Urine Culture     Status: Abnormal   Collection Time: 04/13/21  3:48 AM   Specimen: Urine, Clean Catch  Result Value Ref Range Status   Specimen Description   Final    URINE, CLEAN CATCH Performed at [facility], [address] [place], [place] [address]    Special Requests   Final    NONE Performed at [facility], [address], [place], [place] [address]    Culture MULTIPLE SPECIES PRESENT, SUGGEST RECOLLECTION (A)  Final   Report Status 04/14/2021 FINAL  Final  Culture, blood (Routine X 2) w Reflex to ID Panel     Status: None   Collection Time: 04/13/21  4:18 AM   Specimen: Right Antecubital; Blood  Result Value Ref Range Status   Specimen Description RIGHT ANTECUBITAL  Final   Special Requests   Final    BOTTLES DRAWN AEROBIC AND  ANAEROBIC Blood Culture adequate volume   Culture   Final    NO GROWTH 5 DAYS Performed at [facility], [address] [place], [place] [address]  Report Status 04/18/2021 FINAL  Final  Culture, blood (Routine X 2) w Reflex to ID Panel     Status: None   Collection Time: 04/13/21  4:18 AM   Specimen: BLOOD RIGHT HAND  Result Value Ref Range Status   Specimen Description BLOOD RIGHT HAND  Final   Special Requests   Final    BOTTLES DRAWN AEROBIC AND ANAEROBIC Blood Culture adequate volume   Culture   Final    NO GROWTH 5 DAYS Performed at [facility], [address] [place], [place] [address]    Report Status 04/18/2021 FINAL  Final     Scheduled Meds:  aspirin EC  81 mg Oral Daily   Chlorhexidine Gluconate Cloth  6 each Topical Daily   feeding supplement  237 mL Oral BID BM   mouth rinse  15 mL Mouth Rinse BID   predniSONE  20 mg Oral Q breakfast   simvastatin  40 mg Oral q1800   sodium zirconium cyclosilicate  10 g Oral Once   Continuous Infusions:  sodium chloride 250 mL (04/13/21 0915)   ceFEPime (MAXIPIME) IV 2 g (04/19/21 0625)   DOBUTamine 7.5 mcg/kg/min (04/19/21 1048)   DOPamine 12.5 mcg/kg/min (04/19/21 1042)    Procedures/Studies: NM Pulmonary Perfusion  Result Date: 04/13/2021 CLINICAL DATA:  Respiratory failure. Acute hypoxic respiratory failure. EXAM: NUCLEAR MEDICINE PERFUSION LUNG SCAN TECHNIQUE: Perfusion images were obtained in multiple projections after intravenous injection of radiopharmaceutical. Ventilation scans intentionally deferred if perfusion scan and chest x-ray adequate for interpretation during COVID 19 epidemic. RADIOPHARMACEUTICALS:  4.2 mCi Tc-5mMAA IV COMPARISON:  Chest radiography same day FINDINGS: No evidence of segmental or subsegmental perfusion anomalies. Small amount of fluid seen in the fissures. Findings PICC low probability of pulmonary emboli. IMPRESSION: No segmental or subsegmental perfusion anomalies. Fissure signs which  could be seen with small effusions/early congestive heart failure. Study predicts low probability of pulmonary emboli. Electronically Signed   By: MNelson ChimesM.D.   On: 04/13/2021 13:36   [place]  Result Date: 04/14/2021 CLINICAL DATA:  Renal dysfunction EXAM: RENAL / URINARY TRACT ULTRASOUND COMPLETE COMPARISON:  CT done on 11/04/2020 FINDINGS: Right Kidney: Renal measurements: 9.8 x 5.3 x 3.9 cm = volume: 105.1 mL. There is no hydronephrosis. There is interval resolution of right hydronephrosis since the CT done on 11/04/2020. There are multiple smooth marginated anechoic lesions in the right kidney largest measuring 4.9 cm in the lower pole. Left Kidney: Renal measurements: 10.6 x 4.6 x 5.6 cm = volume: 141.4 mL. There is mild lobulation in the margin. Technologist observed 7 mm hyperechoic structure in the upper pole which was not distinctly seen in the previous CT and may be a partial volume averaging artifact. There is possible 10 mm cyst in the lower pole. Left adrenal is not visualized in the submitted images. Bladder: Urinary bladder is not distended. Other: None. IMPRESSION: There is no hydronephrosis.  Bilateral renal cysts. Electronically Signed   By: PElmer PickerM.D.   On: 04/14/2021 16:16   DG Chest Port 1 View  Result Date: 04/18/2021 CLINICAL DATA:  Increased shortness of breath EXAM: PORTABLE CHEST 1 VIEW COMPARISON:  04/13/2021 FINDINGS: Unchanged mild-to-moderate cardiomegaly, with mild central vascular fullness. No definite edema. Status post median sternotomy and CABG. Left chest cardiac device, with unchanged position of the generator or wires. Slightly increased opacities in the retrocardiac left lower lobe and right lung base. Unchanged small right pleural effusion. No left pleural effusion. Remote right rib fracture.  No acute osseous abnormality. IMPRESSION: Slightly increased opacities in the lung bases, which are nonspecific but concerning for infection. Stable right  pleural effusion and cardiomegaly. Electronically Signed   By: Merilyn Baba M.D.   On: 04/18/2021 13:31   DG CHEST PORT 1 VIEW  Result Date: 04/13/2021 CLINICAL DATA:  Difficulty breathing. EXAM: PORTABLE CHEST 1 VIEW COMPARISON:  Portable chest yesterday at 3:09 p.m. FINDINGS: 4:47 a.m., 04/13/2021. There is mild-to-moderate cardiomegaly. Mild central vascular fullness continues to be noted without appreciable edema. Sternotomy and CABG changes are redemonstrated as well as a single lead left chest cardiac assist device and wire insertion. There is increased streaky atelectasis or infiltrate in the retrocardiac left lower lobe and increased patchy opacities in the right lung base concerning for pneumonia with a small underlying right pleural effusion. Remaining lung fields remain clear. No other interval changes. Osteopenia. IMPRESSION: Increasing opacity in the left infrahilar retrocardiac lower lobe and right lung base concerning for pneumonia with worsening. Stable small right pleural effusion. Cardiomegaly. Electronically Signed   By: Telford Nab M.D.   On: 04/13/2021 05:49   DG Chest Port 1 View  Result Date: 04/12/2021 CLINICAL DATA:  SOB EXAM: PORTABLE CHEST 1 VIEW COMPARISON:  August 2022 FINDINGS: Implantable cardiac device with stable position of the lead. The heart appears within normal limits for size. Probable small right pleural effusion. No left pleural effusion. No pneumothorax. Heterogeneous right lower lung airspace opacities. Osteopenia with healing/chronic right rib fractures, present on previous exam. Median sternotomy wires. IMPRESSION: There are new right lower lung heterogeneous airspace opacities with small right pleural effusion. Electronically Signed   By: Albin Felling M.D.   On: 04/12/2021 15:17   DG Chest Port 1V same Day  Result Date: 04/18/2021 CLINICAL DATA:  Post central line placement EXAM: PORTABLE CHEST 1 VIEW COMPARISON:  Film from earlier in the same day.  FINDINGS: Cardiac shadow is enlarged. Defibrillator is again noted. Right jugular central line is noted with the tip at the cavoatrial junction. No pneumothorax is noted. Stable opacities in the bases are not seen right greater than left with small right-sided pleural effusion. IMPRESSION: No pneumothorax following central line placement. No significant interval change from the prior exam is noted. Electronically Signed   By: Inez Catalina M.D.   On: 04/18/2021 19:00   [place] EKG SITE RITE  Result Date: 04/18/2021 If Site Rite image not attached, placement could not be confirmed due to current cardiac rhythm.  [place] EKG SITE RITE  Result Date: 04/18/2021 If Site Rite image not attached, placement could not be confirmed due to current cardiac rhythm.   Deatra James, MD   [facility]  If 7PM-7AM, please contact night-coverage [email or web address] Password [email or web address] 04/19/2021, 12:01 PM   LOS: 7 days

## 2021-04-19 NOTE — Progress Notes (Signed)
PT Cancellation Note  Patient Details Name: [name] MRN: [id number] DOB: December 22, 1926   Cancelled Treatment:    Reason Eval/Treat Not Completed: Medical issues which prohibited therapy.  Patient transferred to a higher level of care and will need new PT consult resume therapy when patient is medically stable.  Thank you.   8:02 AM, 04/19/21 Lonell Grandchild, MPT Physical Therapist with [facility] [phone] office [phone] mobile phone

## 2021-04-19 NOTE — Progress Notes (Signed)
Inpatient Diabetes Program Recommendations  AACE/ADA: New Consensus Statement on Inpatient Glycemic Control (2015)  Target Ranges:  Prepandial:   less than 140 mg/dL      Peak postprandial:   less than 180 mg/dL (1-2 hours)      Critically ill patients:  140 - 180 mg/dL   Lab Results  Component Value Date   GLUCAP 280 (H) 04/18/2021   HGBA1C 5.9 (H) 11/07/2020    Review of Glycemic Control  Latest Reference Range & Units 04/17/21 11:13 04/17/21 16:17 04/17/21 21:12 04/18/21 07:21 04/18/21 11:04 04/18/21 14:56  Glucose-Capillary 70 - 99 mg/dL 173 (H) 320 (H) 391 (H) 218 (H) 243 (H) 280 (H)  (H): Data is abnormally high  Current orders for Inpatient glycemic control: Prednisone 20 units QD  Inpatient Diabetes Program Recommendations:    If appropriate, please consider adding the glycemic control order set using Novolog 0-9 units TID.  Will continue to follow while inpatient.  Thank you, Reche Dixon, RN, BSN Diabetes Coordinator Inpatient Diabetes Program [phone] (team pager from 8a-5p)

## 2021-04-20 DIAGNOSIS — I509 Heart failure, unspecified: Secondary | ICD-10-CM

## 2021-04-20 DIAGNOSIS — J9601 Acute respiratory failure with hypoxia: Secondary | ICD-10-CM | POA: Diagnosis not present

## 2021-04-20 DIAGNOSIS — R001 Bradycardia, unspecified: Secondary | ICD-10-CM | POA: Diagnosis not present

## 2021-04-20 DIAGNOSIS — L8990 Pressure ulcer of unspecified site, unspecified stage: Secondary | ICD-10-CM | POA: Insufficient documentation

## 2021-04-20 DIAGNOSIS — Z20822 Contact with and (suspected) exposure to covid-19: Secondary | ICD-10-CM | POA: Diagnosis not present

## 2021-04-20 DIAGNOSIS — I442 Atrioventricular block, complete: Secondary | ICD-10-CM | POA: Diagnosis not present

## 2021-04-20 DIAGNOSIS — Z515 Encounter for palliative care: Secondary | ICD-10-CM | POA: Diagnosis not present

## 2021-04-20 DIAGNOSIS — Z7189 Other specified counseling: Secondary | ICD-10-CM | POA: Diagnosis not present

## 2021-04-20 DIAGNOSIS — I5022 Chronic systolic (congestive) heart failure: Secondary | ICD-10-CM | POA: Diagnosis not present

## 2021-04-20 LAB — BASIC METABOLIC PANEL
Anion gap: 6 (ref 5–15)
BUN: 78 mg/dL — ABNORMAL HIGH (ref 8–23)
CO2: 25 mmol/L (ref 22–32)
Calcium: 8 mg/dL — ABNORMAL LOW (ref 8.9–10.3)
Chloride: 105 mmol/L (ref 98–111)
Creatinine, Ser: 2.82 mg/dL — ABNORMAL HIGH (ref 0.61–1.24)
GFR, Estimated: 20 mL/min — ABNORMAL LOW (ref >60)
Glucose, Bld: 227 mg/dL — ABNORMAL HIGH (ref 70–99)
Potassium: 4.9 mmol/L (ref 3.5–5.1)
Sodium: 136 mmol/L (ref 135–145)

## 2021-04-20 LAB — MAGNESIUM: Magnesium: 2.2 mg/dL (ref 1.7–2.4)

## 2021-04-20 LAB — CBC
HCT: 38.4 % — ABNORMAL LOW (ref 39.0–52.0)
Hemoglobin: 11.9 g/dL — ABNORMAL LOW (ref 13.0–17.0)
MCH: 29.7 pg (ref 26.0–34.0)
MCHC: 31 g/dL (ref 30.0–36.0)
MCV: 95.8 fL (ref 80.0–100.0)
Platelets: 119 10*3/uL — ABNORMAL LOW (ref 150–400)
RBC: 4.01 MIL/uL — ABNORMAL LOW (ref 4.22–5.81)
RDW: 13.9 % (ref 11.5–15.5)
WBC: 10.2 10*3/uL (ref 4.0–10.5)
nRBC: 0 % (ref 0.0–0.2)

## 2021-04-20 LAB — PHOSPHORUS: Phosphorus: 3 mg/dL (ref 2.5–4.6)

## 2021-04-20 MED ORDER — POLYETHYLENE GLYCOL 3350 17 G PO PACK
17.0000 g | PACK | Freq: Every day | ORAL | Status: DC
  Administered 2021-04-20 – 2021-04-25 (×4): 17 g via ORAL
  Filled 2021-04-20 (×4): qty 1

## 2021-04-20 MED ORDER — FENTANYL CITRATE PF 50 MCG/ML IJ SOSY
12.5000 ug | PREFILLED_SYRINGE | INTRAMUSCULAR | Status: DC | PRN
  Administered 2021-04-20 – 2021-04-21 (×10): 12.5 ug via INTRAVENOUS
  Filled 2021-04-20 (×10): qty 1

## 2021-04-20 MED ORDER — HYDRALAZINE HCL 25 MG PO TABS
25.0000 mg | ORAL_TABLET | Freq: Three times a day (TID) | ORAL | Status: DC
  Administered 2021-04-20 – 2021-04-21 (×5): 25 mg via ORAL
  Filled 2021-04-20 (×5): qty 1

## 2021-04-20 MED ORDER — SIMVASTATIN 20 MG PO TABS
40.0000 mg | ORAL_TABLET | Freq: Every day | ORAL | Status: DC
  Administered 2021-04-20 – 2021-04-24 (×5): 40 mg via ORAL
  Filled 2021-04-20 (×5): qty 2

## 2021-04-20 NOTE — Progress Notes (Addendum)
Progress Note  Patient Name: [name] Date of Encounter: 04/20/2021  [facility] Cardiologist: Rozann Lesches, MD   Subjective   Patient mental status has significantly improved.  He feels much better.  Patient is finishing off his antibiotics.  More alert. No CP.  Inpatient Medications    Scheduled Meds:  aspirin EC  81 mg Oral Daily   Chlorhexidine Gluconate Cloth  6 each Topical Daily   feeding supplement  237 mL Oral BID BM   hydrALAZINE  25 mg Oral Q8H   mouth rinse  15 mL Mouth Rinse BID   predniSONE  20 mg Oral Q breakfast   simvastatin  40 mg Oral q1800   Continuous Infusions:  sodium chloride 250 mL (04/13/21 0915)   DOBUTamine 2.5 mcg/kg/min (04/20/21 0918)   DOPamine 10 mcg/kg/min (04/20/21 0918)   PRN Meds: acetaminophen **OR** acetaminophen, fentaNYL (SUBLIMAZE) injection, guaiFENesin-dextromethorphan, ipratropium-albuterol, loperamide, ondansetron (ZOFRAN) IV, prochlorperazine   Vital Signs    Vitals:   04/20/21 0700 04/20/21 0730 04/20/21 0800 04/20/21 0812  BP: (!) 142/36 (!) 138/45 (!) 163/50   Pulse: (!) 43 (!) 44 (!) 42   Resp: 12 13 14    Temp:    97.8 F (36.6 C)  TempSrc:    Oral  SpO2: 97% 98% 99%   Weight:      Height:        Intake/Output Summary (Last 24 hours) at 04/20/2021 1019 Last data filed at 04/20/2021 0918 Gross per 24 hour  Intake 931.11 ml  Output 300 ml  Net 631.11 ml   Last 3 Weights 04/20/2021 04/19/2021 04/18/2021  Weight (lbs) 169 lb 1.5 oz 164 lb 10.9 oz 165 lb 12.6 oz  Weight (kg) 76.7 kg 74.7 kg 75.2 kg      Telemetry    Complete heart block, junctional escape - Personally Reviewed  ECG    N/a - Personally Reviewed  Physical Exam   GEN: No acute distress.   Neck: No JVD Cardiac: regular bradycardia, no murmurs, rubs, or gallops.  Respiratory: Clear to auscultation bilaterally. GI: Soft, nontender, non-distended  MS: No edema; No deformity. Neuro:  Nonfocal  Psych: pleasant mood and  affect  Labs    High Sensitivity Troponin:   Recent Labs  Lab 04/12/21 2143 04/12/21 2316 04/13/21 0142 04/13/21 0418  TROPONINIHS 162* 184* 189* 221*     Chemistry Recent Labs  Lab 04/14/21 0500 04/15/21 0528 04/16/21 0543 04/17/21 1415 04/18/21 0639 04/18/21 1514 04/20/21 0425  NA 136   < > 140   < > 137 132* 136  K 5.1   < > 3.9   < > 4.4 5.2* 4.9  CL 105   < > 107   < > 106 101 105  CO2 19*   < > 24   < > 24 21* 25  GLUCOSE 118*   < > 126*   < > 235* 350* 227*  BUN 48*   < > 68*   < > 65* 64* 78*  CREATININE 2.83*   < > 2.54*   < > 2.05* 2.14* 2.82*  CALCIUM 8.0*   < > 8.1*   < > 8.3* 8.0* 8.0*  MG  --   --  2.3  --   --  2.3 2.2  PROT 5.7*  --   --   --   --   --   --   ALBUMIN 3.0*  --   --   --   --   --   --  AST 33  --   --   --   --   --   --   ALT 26  --   --   --   --   --   --   ALKPHOS 63  --   --   --   --   --   --   BILITOT 0.8  --   --   --   --   --   --   GFRNONAA 20*   < > 23*   < > 30* 28* 20*  ANIONGAP 12   < > 9   < > 7 10 6    < > = values in this interval not displayed.    Lipids No results for input(s): CHOL, TRIG, HDL, LABVLDL, LDLCALC, CHOLHDL in the last 168 hours.  Hematology Recent Labs  Lab 04/18/21 0639 04/18/21 1514 04/20/21 0425  WBC 7.6 7.8 10.2  RBC 4.03* 4.43 4.01*  HGB 11.9* 13.2 11.9*  HCT 37.3* 41.5 38.4*  MCV 92.6 93.7 95.8  MCH 29.5 29.8 29.7  MCHC 31.9 31.8 31.0  RDW 13.9 14.0 13.9  PLT 89* 127* 119*   Thyroid No results for input(s): TSH, FREET4 in the last 168 hours.  BNP Recent Labs  Lab 04/18/21 0639  BNP 2,074.0*    DDimer No results for input(s): DDIMER in the last 168 hours.   Radiology    DG Chest Port 1 View  Result Date: 04/18/2021 CLINICAL DATA:  Increased shortness of breath EXAM: PORTABLE CHEST 1 VIEW COMPARISON:  04/13/2021 FINDINGS: Unchanged mild-to-moderate cardiomegaly, with mild central vascular fullness. No definite edema. Status post median sternotomy and CABG. Left chest cardiac  device, with unchanged position of the generator or wires. Slightly increased opacities in the retrocardiac left lower lobe and right lung base. Unchanged small right pleural effusion. No left pleural effusion. Remote right rib fracture.  No acute osseous abnormality. IMPRESSION: Slightly increased opacities in the lung bases, which are nonspecific but concerning for infection. Stable right pleural effusion and cardiomegaly. Electronically Signed   By: Merilyn Baba M.D.   On: 04/18/2021 13:31   DG Chest Port 1V same Day  Result Date: 04/18/2021 CLINICAL DATA:  Post central line placement EXAM: PORTABLE CHEST 1 VIEW COMPARISON:  Film from earlier in the same day. FINDINGS: Cardiac shadow is enlarged. Defibrillator is again noted. Right jugular central line is noted with the tip at the cavoatrial junction. No pneumothorax is noted. Stable opacities in the bases are not seen right greater than left with small right-sided pleural effusion. IMPRESSION: No pneumothorax following central line placement. No significant interval change from the prior exam is noted. Electronically Signed   By: Inez Catalina M.D.   On: 04/18/2021 19:00   [place] EKG SITE RITE  Result Date: 04/18/2021 If Site Rite image not attached, placement could not be confirmed due to current cardiac rhythm.  [place] EKG SITE RITE  Result Date: 04/18/2021 If Site Rite image not attached, placement could not be confirmed due to current cardiac rhythm.   Cardiac Studies    Patient Profile     87 y.o. male  CAD (s/p CABG in 2006 with LIMA-LAD, SVG-LCx and SVG-dRCA, repeat cath in 12/2020 showing 3/3 patent grafts and likely culprit was 80% LPAV and medical management recommended), HFrEF/ICM (EF at 45-50% in 2016, at 40-45% by repeat echo in 12/2020), [employer] ICD at [facility] (previously followed by Dr. Lovena Le and at [facility] in 2016 and not replaced due  to improvement in his EF), history of prostate cancer, nephrolithiasis, MDS, HTN and HLD who presented with  septic shock secondary to pneumonia. Cardiology was consulted for Afib and CHF.   Assessment & Plan    Sinus pauses/Asystole/Comeplete heart blck PNA finishing antibiotics and transiently needing pressors Transient AF no prior history with no plans for [facility] unless significant reoccurrence HFrEF AKI CAD with prior CABG -  S/p CABG in 2006 with LIMA-LAD, SVG-LCx and SVG-dRCA, repeat cath in 12/2020 showing 3/3 patent grafts and likely culprit was 80% LPAV and medical management recommended. No anginal symptoms; during that admission I increased his AV nodal agent and this was well tolerated - episodes of sinus pauses, self terminating asystole, and complete heart block starting after receiving toprol 50mg  x 1. Patient had been on coreg 12.5mg  bid at home, surprising response to toprol dose.  - he has not need pacing today (04/21/21) thought is still in heart block - he is still on dopamine; we are weaning his dobutamine presently, given that last attempt to this had significant HTN, we are adding hydralazine 25 mg PO TID; if he tolerates this we will slowly decrease dopamine dose and can increase his hydralazine further in intervals of 25 mg  - his ARB and MRA are held in the setting of worsening AKI (has fluctuated through course) - part of AKI is related to balancing volume status (was previously volume overloaded - continue ASA, statin.    Discussed with patient, family, and primary MD - our goal is to delay need for generator change as long as possible given presentation with PNA and risk of device related infection - today we will attempt to wean chronotropic support - if we are unable to do this, I would be in favor of transfer to [facility] 04/21/21 if EP would consider generator changes as he was reasonably functional prior to admission; and has improved his mentation since last eval  CRITICAL CARE Performed by: Charels Stambaugh A Jalayne Ganesh  Total critical care time: 45 minutes. Critical care time was  exclusive of separately billable procedures and treating other patients. Critical care was necessary to treat or prevent imminent or life-threatening deterioration. Critical care was time spent personally by me on the following activities: development of treatment plan with patient and/or surrogate as well as nursing, discussions with consultants, evaluation of patient's response to treatment, examination of patient, obtaining history from patient or surrogate, ordering and performing treatments and interventions, ordering and review of laboratory studies, ordering and review of radiographic studies, pulse oximetry and re-evaluation of patient's condition.    Signed, Rudean Haskell, [place]  04/20/2021 10:28 AM     For questions or updates, please contact [place] Please consult [email or web address] for contact info under        Signed, Werner Lean, MD  04/20/2021, 10:19 AM

## 2021-04-20 NOTE — Progress Notes (Signed)
Palliative:  HPI: 87 y.o. male  with past medical history of cardiomyopathy, hypertension, systolic heart failure EF 40-45%, CAD, s/p AICD, CKD stage 3a, prostate cancer, myelodysplasia low grade, BPH admitted on 04/12/2021 with difficulty breathing with bilateral lower extremity swelling, productive cough, and with severe hypoxia improved on 6L found to have sepsis with lobar pneumonia. Hospitalization complicated by symptomatic bradycardia and complete heart block requiring atropine and dopamine infusion. Family meeting with Dr. Carles Collet 04/18/21 and family have opted for DNR status and they do not wish for him to suffer but to continue current interventions at that time.    I met today at [name] bedside along with son, [name]. Mr. Vandiver mentation is much improved from yesterday. He is alert and talking to me. He is appropriate and says he feels "awful" - no specific pains just weak, tired, and sick of being sick. Given his improvement in mentation they wish to continue with current interventions and more open to considering EP intervention if this is cardiology's recommendation and if he continues to have improvement and progression. Mr. Martelli and [name] are happy with plan and care. No questions/concerns. Emotional support provided.   Exam: Alert, oriented. Resting in bed. No distress. HR bradycardia junctional/heart block with demand transcutaneous pacer. Breathing regular, unlabored. Abd flat. Moves all extremities.  Was up to chair this morning.   Plan: - DNR in place. - Open to EP intervention if he remains with signs of improvement and hope of returning to acceptable quality of life.   15 min  Vinie Sill, NP Palliative Medicine Team Pager [phone] (Please see [email or web address] for schedule) Team Phone [phone]    Greater than 50%  of this time was spent counseling and coordinating care related to the above assessment and plan

## 2021-04-20 NOTE — Progress Notes (Addendum)
Patient having more frequent episodes of pacing this past hour, >15 occurences and with much discomfort from external pacing pads. Primary MD Shahmehdi notified via secure chat, and asked this RN to call Cardiology. Patients BP 841'Y systolic. Dopamine infusion at 10 mcg/kg/min.   Cardiology called and updated, recommending possible transfer for transvenous pacing if patient continues to require external pacing.

## 2021-04-20 NOTE — Progress Notes (Signed)
PROGRESS NOTE  [name] KGM:[id number] DOB: 1927-01-02 DOA: 04/12/2021 PCP: [name], MD   Subjective: The patient was seen and examined this morning.  Much more awake, interactive, actively having nausea and vomiting. Wife and son present at bedside, stating that he tolerated his breakfast early this morning. Denies any chest pain or shortness of breath Still on dopamine and dobutamine-heart rate 42-44      Brief History:  87 year old male with a history of coronary artery disease status post CABG, systolic CHF with EF 64-40%, AICD, prostate cancer, MDS, hypertension, hyperlipidemia presenting with shortness of breath of 1 week duration.  History is supplemented by the patient's wife.  Apparently his shortness of breath continue to worsen over the past 2 to 3 days prior to admission.  He had a cough with cream-colored sputum.  There is no hemoptysis, fevers, chills, chest pain, nausea, vomiting, diarrhea, abdominal pain.  The patient has bilateral lower extremity edema which appears to be chronic.  Spouse states that his edema is actually a little bit better than usual.  Upon EMS activation.  The patient was noted to have oxygen saturation of 60% on room air.  He was placed on nonrebreather.  In the emergency department, patient was initially afebrile and hemodynamically stable.  He was placed on 6 L with oxygen saturation in the low 90s.  Chest x-ray showed bibasilar opacities, regular in the left.  BMP showed sodium sodium 137, potassium 3.7, bicarbonate 26, serum creatinine 1.53.  WBC 5.9, hemoglobin 13.8, platelets 86,000.   Notably, the patient had a hospital admission from 12/17/2020 to 12/20/2020 for chest pain concerning for unstable angina.  He was started on IV heparin.  He underwent cardiac catheterization.  . Repeat catheterization showed 3/3 patent grafts and the likely culprit lesion was 80% stenosis in the distal LPAV prior to bifurcation into LPL 1 and LPL 2 which  was filled from retrograde flow from the SVG to OM 2 and was not approachable for PCI and medical management was recommended. Coreg was increased to 12.5 mg twice daily and Losartan increased to 50 mg daily. He was continued on ASA 81 mg daily and Crestor 20 mg daily with consideration of adding Spironolactone or an SGLT2 inhibitor as an outpatient if renal function remained stable (was at 1.05 on the day of discharge).  He was admitted to the ICU.  He continued to have hypoxia and was placed on BiPAP.  He subsequently developed hypotension requiring fluid resuscitation and vasopressor support.  UA was negative for pyuria.   Assessment/Plan:  Symptomatic bradycardia/Complete Heart Block (asystole/sinus pause) With history of paroxysmal atrial fibrillation (not on chronic anticoagulation therapy -04/18/21--bradycardic into 20-30 HR with hypotension--atropine 0.5 mg x 3 -pt received metoprolol this am>>d/c -started on dopamine drip and transferred to ICU -Cardiology, Dr. [name]... Continuing dopamine drip at 12.5 today, and dobutamine External pacemaker in place -brief episode of pacing overnight and early this morning Cardiology recommending if no improvement to transfer to [name] for EP evaluation  -Per cardiology attempting to wean off dobutamine, dopamine... If unsuccessful plan is to transfer the patient to [facility] on 04/21/2021 for EP considering generator change -has ICD in place   Sepsis -Resolved  -Sepsis physiology resolved with exception of bradycardia -On admission met sepsis criteria - with tachypnea, fever, and respiratory failure -Secondary to pneumonia -PCT 43.33>>57.55 -Follow blood cultures--neg to date -UA negative for pyuria -remains of neosynephrine 25 mcg>>weaned off  04/14/21 -continue solucortef>>wean off -As cultures remain negative, anticipating discontinuing antibiotics Discontinuing antibiotics today 04/20/2021, total of 8 days of antibiotics   Lobar  pneumonia -Improved -Personally reviewed chest x-ray--right greater than left basilar opacity -Continue cefepime and azithromycin -MRSA screen neg -PCT 43>>57.55   Acute respiratory failure with hypoxia and hypercarbia -Secondary to pneumonia --much improved -Currently on BiPAP FiO2 0.5 -Wean back to room air as tolerated for saturation greater 90% -weaned to 5W>>6.5K>>8L   Chronic systolic CHF/ischemic cardiomyopathy -12/17/2020 echo EF 40 to 45%, +WMA -Holding carvedilol, losartan, spironolactone secondary to hypotension -Holding furosemide temporarily     Essential hypertension -Holding losartan, carvedilol, spironolactone secondary to hypotension -Blood pressure has stabilized--on dopamine drip -Hypotensive today, monitoring closely   Coronary artery disease -No chest pain presently -Continue aspirin -Holding carvedilol, spironolactone, losartan secondary to hypotension -resume ASA and statin   Acute on chronic renal failure--CKD stage IIIa -Baseline creatinine 1.1-1.3 -In part due to hypotension and furosemide -serum creatinine peaking 2.83>>2.97>>2.54>>2.13 >> 2.82 -renal US--no hydronephrosis   Hyperlipidemia -continue statin   Tobacco abuse in remission -50 pk yr -quit 20 years ago   Moderate malnutrition -continue supplements  Goals of Care -updated spouse -discussed GOC -confirmed DNR -palliative consult in am       Status is: Inpatient   Remains inpatient appropriate because: hemodynamic instability, severity of illness requiring BiPAP and IV abx, requiring IV medication for severe symptomatic bradycardia       Family Communication:   spouse and his Son was updated at bedside12/8/22   Consultants:  Cardiology   Code Status:  DNR   DVT Prophylaxis:  [place] Heparin      Procedures: As Listed in Progress Note Above   Antibiotics: Cefepime 12/1>> 12 / 8 Azithro 12/1>> 12/7     Objective: Vitals:   04/20/21 0700 04/20/21 0730 04/20/21  0800 04/20/21 0812  BP: (!) 142/36 (!) 138/45 (!) 163/50   Pulse: (!) 43 (!) 44 (!) 42   Resp: '12 13 14   ' Temp:    97.8 F (36.6 C)  TempSrc:    Oral  SpO2: 97% 98% 99%   Weight:      Height:        Intake/Output Summary (Last 24 hours) at 04/20/2021 1056 Last data filed at 04/20/2021 0918 Gross per 24 hour  Intake 931.11 ml  Output 300 ml  Net 631.11 ml   Weight change: 2 kg     Physical Exam:   General:  Much more awake alert this morning  HEENT:  Normocephalic, PERRL, otherwise with in Normal limits   Neuro:  CNII-XII intact. , normal motor and sensation, reflexes intact   Lungs:   Clear to auscultation BL, Respirations unlabored, no wheezes / crackles  Cardio:    S1/S2, RRR, No murmure, No Rubs or Gallops   Abdomen:   Soft, non-tender, bowel sounds active all four quadrants,  no guarding or peritoneal signs.  Muscular skeletal:  Limited exam - in bed, able to move all 4 extremities, global generalized weaknesses 2+ pulses,  symmetric, +2 pitting edema  Skin:  Dry, warm to touch, negative for any Rashes,  Wounds: Please see nursing documentation  Pressure Injury 04/19/21 Buttocks Right;Left Stage 1 -  Intact skin with non-blanchable redness of a localized area usually over a bony prominence. (Active)  04/19/21 2000  Location: Buttocks  Location Orientation: Right;Left  Staging: Stage 1 -  Intact skin with non-blanchable redness of a localized area usually over a bony prominence.  Wound Description (Comments):   Present on Admission:            Reviewed: I have personally reviewed following labs and imaging studies Basic Metabolic Panel: Recent Labs  Lab 04/16/21 0543 04/17/21 1415 04/18/21 0639 04/18/21 1514 04/20/21 0425  NA 140 134* 137 132* 136  K 3.9 4.6 4.4 5.2* 4.9  CL 107 104 106 101 105  CO2 '24 24 24 ' 21* 25  GLUCOSE 126* 287* 235* 350* 227*  BUN 68* 65* 65* 64* 78*  CREATININE 2.54* 2.13* 2.05* 2.14* 2.82*  CALCIUM 8.1* 8.1* 8.3* 8.0* 8.0*   MG 2.3  --   --  2.3 2.2  PHOS  --   --   --   --  3.0   Liver Function Tests: Recent Labs  Lab 04/14/21 0500  AST 33  ALT 26  ALKPHOS 63  BILITOT 0.8  PROT 5.7*  ALBUMIN 3.0*   No results for input(s): LIPASE, AMYLASE in the last 168 hours. No results for input(s): AMMONIA in the last 168 hours. Coagulation Profile: No results for input(s): INR, PROTIME in the last 168 hours. CBC: Recent Labs  Lab 04/14/21 0500 04/15/21 0528 04/16/21 0543 04/17/21 1415 04/18/21 0639 04/18/21 1514 04/20/21 0425  WBC 11.6*   < > 6.3 4.1 7.6 7.8 10.2  NEUTROABS 5.1  --   --   --   --   --   --   HGB 12.9*   < > 12.1* 13.0 11.9* 13.2 11.9*  HCT 42.2   < > 38.9* 40.2 37.3* 41.5 38.4*  MCV 98.1   < > 94.2 93.3 92.6 93.7 95.8  PLT 79*   < > 74* 84* 89* 127* 119*   < > = values in this interval not displayed.   Cardiac Enzymes: No results for input(s): CKTOTAL, CKMB, CKMBINDEX, TROPONINI in the last 168 hours. BNP: Invalid input(s): POCBNP CBG: Recent Labs  Lab 04/17/21 1617 04/17/21 2112 04/18/21 0721 04/18/21 1104 04/18/21 1456  GLUCAP 320* 391* 218* 243* 280*   HbA1C: No results for input(s): HGBA1C in the last 72 hours. Urine analysis:    Component Value Date/Time   COLORURINE YELLOW 04/13/2021 0348   APPEARANCEUR CLEAR 04/13/2021 0348   LABSPEC 1.025 04/13/2021 0348   PHURINE 5.0 04/13/2021 0348   GLUCOSEU NEGATIVE 04/13/2021 0348   HGBUR SMALL (A) 04/13/2021 0348   BILIRUBINUR NEGATIVE 04/13/2021 0348   KETONESUR NEGATIVE 04/13/2021 0348   PROTEINUR 100 (A) 04/13/2021 0348   NITRITE NEGATIVE 04/13/2021 0348   LEUKOCYTESUR SMALL (A) 04/13/2021 0348   Sepsis Labs: '@LABRCNTIP' (procalcitonin:4,lacticidven:4) ) Recent Results (from the past 240 hour(s))  Resp Panel by RT-PCR (Flu A&B, Covid) Nasopharyngeal Swab     Status: None   Collection Time: 04/12/21  2:59 PM   Specimen: Nasopharyngeal Swab; Nasopharyngeal(NP) swabs in vial transport medium  Result Value Ref  Range Status   SARS Coronavirus 2 by RT PCR NEGATIVE NEGATIVE Final    Comment: (NOTE) SARS-CoV-2 target nucleic acids are NOT DETECTED.  The SARS-CoV-2 RNA is generally detectable in upper respiratory specimens during the acute phase of infection. The lowest concentration of SARS-CoV-2 viral copies this assay can detect is 138 copies/mL. A negative result does not preclude SARS-Cov-2 infection and should not be used as the sole basis for treatment or other patient management decisions. A negative result may occur with  improper specimen collection/handling, submission of specimen other than nasopharyngeal swab, presence of viral mutation(s) within the areas targeted by this assay, and  inadequate number of viral copies(<138 copies/mL). A negative result must be combined with clinical observations, patient history, and epidemiological information. The expected result is Negative.  Fact Sheet for Patients:  EntrepreneurPulse.com.au  Fact Sheet for Healthcare Providers:  IncredibleEmployment.be  This test is no t yet approved or cleared by the [place] FDA and  has been authorized for detection and/or diagnosis of SARS-CoV-2 by FDA under an Emergency Use Authorization (EUA). This EUA will remain  in effect (meaning this test can be used) for the duration of the COVID-19 declaration under Section 564(b)(1) of the Act, 21 U.S.C.section 360bbb-3(b)(1), unless the authorization is terminated  or revoked sooner.       Influenza A by PCR NEGATIVE NEGATIVE Final   Influenza B by PCR NEGATIVE NEGATIVE Final    Comment: (NOTE) The Xpert Xpress SARS-CoV-2/FLU/RSV plus assay is intended as an aid in the diagnosis of influenza from Nasopharyngeal swab specimens and should not be used as a sole basis for treatment. Nasal washings and aspirates are unacceptable for Xpert Xpress SARS-CoV-2/FLU/RSV testing.  Fact Sheet for  Patients: EntrepreneurPulse.com.au  Fact Sheet for Healthcare Providers: IncredibleEmployment.be  This test is not yet approved or cleared by the [place] FDA and has been authorized for detection and/or diagnosis of SARS-CoV-2 by FDA under an Emergency Use Authorization (EUA). This EUA will remain in effect (meaning this test can be used) for the duration of the COVID-19 declaration under Section 564(b)(1) of the Act, 21 U.S.C. section 360bbb-3(b)(1), unless the authorization is terminated or revoked.  Performed at [facility], [address], [place], [place] [address]   MRSA Next Gen by PCR, Nasal     Status: None   Collection Time: 04/12/21  7:50 PM   Specimen: Nasal Mucosa; Nasal Swab  Result Value Ref Range Status   MRSA by PCR Next Gen NOT DETECTED NOT DETECTED Final    Comment: (NOTE) The GeneXpert MRSA Assay (FDA approved for NASAL specimens only), is one component of a comprehensive MRSA colonization surveillance program. It is not intended to diagnose MRSA infection nor to guide or monitor treatment for MRSA infections. Test performance is not FDA approved in patients less than 38 years old. Performed at [facility], [address], [place], [place] [address]   Urine Culture     Status: Abnormal   Collection Time: 04/13/21  3:48 AM   Specimen: Urine, Clean Catch  Result Value Ref Range Status   Specimen Description   Final    URINE, CLEAN CATCH Performed at [facility], [address] [place], [place] [address]    Special Requests   Final    NONE Performed at [facility], [address], [place], [place] [address]    Culture MULTIPLE SPECIES PRESENT, SUGGEST RECOLLECTION (A)  Final   Report Status 04/14/2021 FINAL  Final  Culture, blood (Routine X 2) w Reflex to ID Panel     Status: None   Collection Time: 04/13/21  4:18 AM   Specimen: Right Antecubital; Blood  Result Value Ref Range Status   Specimen Description RIGHT  ANTECUBITAL  Final   Special Requests   Final    BOTTLES DRAWN AEROBIC AND ANAEROBIC Blood Culture adequate volume   Culture   Final    NO GROWTH 5 DAYS Performed at [facility], [address] [place], [place] [address]    Report Status 04/18/2021 FINAL  Final  Culture, blood (Routine X 2) w Reflex to ID Panel     Status: None  Collection Time: 04/13/21  4:18 AM   Specimen: BLOOD RIGHT HAND  Result Value Ref Range Status   Specimen Description BLOOD RIGHT HAND  Final   Special Requests   Final    BOTTLES DRAWN AEROBIC AND ANAEROBIC Blood Culture adequate volume   Culture   Final    NO GROWTH 5 DAYS Performed at [facility], [address] [place], [place] [address]    Report Status 04/18/2021 FINAL  Final     Scheduled Meds:  aspirin EC  81 mg Oral Daily   Chlorhexidine Gluconate Cloth  6 each Topical Daily   feeding supplement  237 mL Oral BID BM   hydrALAZINE  25 mg Oral Q8H   mouth rinse  15 mL Mouth Rinse BID   predniSONE  20 mg Oral Q breakfast   simvastatin  40 mg Oral q1800   Continuous Infusions:  sodium chloride 250 mL (04/13/21 0915)   DOBUTamine 2.5 mcg/kg/min (04/20/21 0918)   DOPamine 10 mcg/kg/min (04/20/21 0918)    Procedures/Studies: NM Pulmonary Perfusion  Result Date: 04/13/2021 CLINICAL DATA:  Respiratory failure. Acute hypoxic respiratory failure. EXAM: NUCLEAR MEDICINE PERFUSION LUNG SCAN TECHNIQUE: Perfusion images were obtained in multiple projections after intravenous injection of radiopharmaceutical. Ventilation scans intentionally deferred if perfusion scan and chest x-ray adequate for interpretation during COVID 19 epidemic. RADIOPHARMACEUTICALS:  4.2 mCi Tc-62mMAA IV COMPARISON:  Chest radiography same day FINDINGS: No evidence of segmental or subsegmental perfusion anomalies. Small amount of fluid seen in the fissures. Findings PICC low probability of pulmonary emboli. IMPRESSION: No segmental or subsegmental perfusion anomalies. Fissure signs  which could be seen with small effusions/early congestive heart failure. Study predicts low probability of pulmonary emboli. Electronically Signed   By: MNelson ChimesM.D.   On: 04/13/2021 13:36   [place]  Result Date: 04/14/2021 CLINICAL DATA:  Renal dysfunction EXAM: RENAL / URINARY TRACT ULTRASOUND COMPLETE COMPARISON:  CT done on 11/04/2020 FINDINGS: Right Kidney: Renal measurements: 9.8 x 5.3 x 3.9 cm = volume: 105.1 mL. There is no hydronephrosis. There is interval resolution of right hydronephrosis since the CT done on 11/04/2020. There are multiple smooth marginated anechoic lesions in the right kidney largest measuring 4.9 cm in the lower pole. Left Kidney: Renal measurements: 10.6 x 4.6 x 5.6 cm = volume: 141.4 mL. There is mild lobulation in the margin. Technologist observed 7 mm hyperechoic structure in the upper pole which was not distinctly seen in the previous CT and may be a partial volume averaging artifact. There is possible 10 mm cyst in the lower pole. Left adrenal is not visualized in the submitted images. Bladder: Urinary bladder is not distended. Other: None. IMPRESSION: There is no hydronephrosis.  Bilateral renal cysts. Electronically Signed   By: PElmer PickerM.D.   On: 04/14/2021 16:16   DG Chest Port 1 View  Result Date: 04/18/2021 CLINICAL DATA:  Increased shortness of breath EXAM: PORTABLE CHEST 1 VIEW COMPARISON:  04/13/2021 FINDINGS: Unchanged mild-to-moderate cardiomegaly, with mild central vascular fullness. No definite edema. Status post median sternotomy and CABG. Left chest cardiac device, with unchanged position of the generator or wires. Slightly increased opacities in the retrocardiac left lower lobe and right lung base. Unchanged small right pleural effusion. No left pleural effusion. Remote right rib fracture.  No acute osseous abnormality. IMPRESSION: Slightly increased opacities in the lung bases, which are nonspecific but concerning for infection. Stable  right pleural effusion and cardiomegaly. Electronically Signed   By: AFrancetta FoundD.  On: 04/18/2021 13:31   DG CHEST PORT 1 VIEW  Result Date: 04/13/2021 CLINICAL DATA:  Difficulty breathing. EXAM: PORTABLE CHEST 1 VIEW COMPARISON:  Portable chest yesterday at 3:09 p.m. FINDINGS: 4:47 a.m., 04/13/2021. There is mild-to-moderate cardiomegaly. Mild central vascular fullness continues to be noted without appreciable edema. Sternotomy and CABG changes are redemonstrated as well as a single lead left chest cardiac assist device and wire insertion. There is increased streaky atelectasis or infiltrate in the retrocardiac left lower lobe and increased patchy opacities in the right lung base concerning for pneumonia with a small underlying right pleural effusion. Remaining lung fields remain clear. No other interval changes. Osteopenia. IMPRESSION: Increasing opacity in the left infrahilar retrocardiac lower lobe and right lung base concerning for pneumonia with worsening. Stable small right pleural effusion. Cardiomegaly. Electronically Signed   By: Telford Nab M.D.   On: 04/13/2021 05:49   DG Chest Port 1 View  Result Date: 04/12/2021 CLINICAL DATA:  SOB EXAM: PORTABLE CHEST 1 VIEW COMPARISON:  August 2022 FINDINGS: Implantable cardiac device with stable position of the lead. The heart appears within normal limits for size. Probable small right pleural effusion. No left pleural effusion. No pneumothorax. Heterogeneous right lower lung airspace opacities. Osteopenia with healing/chronic right rib fractures, present on previous exam. Median sternotomy wires. IMPRESSION: There are new right lower lung heterogeneous airspace opacities with small right pleural effusion. Electronically Signed   By: Albin Felling M.D.   On: 04/12/2021 15:17   DG Chest Port 1V same Day  Result Date: 04/18/2021 CLINICAL DATA:  Post central line placement EXAM: PORTABLE CHEST 1 VIEW COMPARISON:  Film from earlier in the same  day. FINDINGS: Cardiac shadow is enlarged. Defibrillator is again noted. Right jugular central line is noted with the tip at the cavoatrial junction. No pneumothorax is noted. Stable opacities in the bases are not seen right greater than left with small right-sided pleural effusion. IMPRESSION: No pneumothorax following central line placement. No significant interval change from the prior exam is noted. Electronically Signed   By: Inez Catalina M.D.   On: 04/18/2021 19:00   [place] EKG SITE RITE  Result Date: 04/18/2021 If Site Rite image not attached, placement could not be confirmed due to current cardiac rhythm.  [place] EKG SITE RITE  Result Date: 04/18/2021 If Site Rite image not attached, placement could not be confirmed due to current cardiac rhythm.      The patient is critically ill with multiple organ systems failure and requires high complexity decision making for assessment and support, frequent evaluation and titration of therapies, application of advanced monitoring technologies and extensive interpretation of multiple databases.  Critical care time - 45 mins.    Deatra James, MD   [facility]  If 7PM-7AM, please contact night-coverage [email or web address] Password [email or web address] 04/20/2021, 10:56 AM   LOS: 8 days

## 2021-04-20 NOTE — Progress Notes (Signed)
Bipap is PRN order. 

## 2021-04-20 NOTE — Progress Notes (Signed)
Patient remains in a complete heart block with sinus pauses noted. Rate currently 40. On Dopamine at 48mcg/kg/min and Dobutamine has been stopped since this afternoon and PO hydralazine given.

## 2021-04-20 NOTE — Progress Notes (Signed)
   Called by Dr. Roger Shelter and spoke with him and the ICU nurse caring for [name] - he was doing well today with plan to wean dobutamine as he was hypertensive, HR in the 70's - as the day has progressed he has again developed heart block and is intermittently pacing and is uncomfortable. I would advise increasing the dopamine and increase pain medication. Plan is possible transfer to [facility], however, given age, comorbidities, etc, he is a poor device candidate. Also noted renal function is declining and hemoglobin is lower as well.  If there is clinical decompensation or loss of pacing capture, may need to transfer earlier for temporary pacing wire.  Pixie Casino, MD, [facility], [place] Director of the Advanced Lipid Disorders &  Cardiovascular Risk Reduction Clinic Diplomate of the American Board of Clinical Lipidology Attending Cardiologist  Direct Dial: [phone]  Fax: [phone]  Website:  [email or web address]

## 2021-04-21 ENCOUNTER — Encounter (HOSPITAL_COMMUNITY)
Admission: EM | Disposition: A | Discharge status: Skilled Nursing Facility | Payer: Self-pay | Source: Home / Self Care | Attending: Internal Medicine

## 2021-04-21 DIAGNOSIS — R001 Bradycardia, unspecified: Secondary | ICD-10-CM | POA: Diagnosis not present

## 2021-04-21 DIAGNOSIS — I5023 Acute on chronic systolic (congestive) heart failure: Secondary | ICD-10-CM | POA: Diagnosis not present

## 2021-04-21 DIAGNOSIS — I509 Heart failure, unspecified: Secondary | ICD-10-CM | POA: Diagnosis not present

## 2021-04-21 DIAGNOSIS — I442 Atrioventricular block, complete: Secondary | ICD-10-CM | POA: Diagnosis not present

## 2021-04-21 DIAGNOSIS — J181 Lobar pneumonia, unspecified organism: Secondary | ICD-10-CM | POA: Diagnosis not present

## 2021-04-21 DIAGNOSIS — I10 Essential (primary) hypertension: Secondary | ICD-10-CM | POA: Diagnosis not present

## 2021-04-21 HISTORY — PX: TEMPORARY PACEMAKER: CATH118268

## 2021-04-21 LAB — BASIC METABOLIC PANEL
Anion gap: 8 (ref 5–15)
Anion gap: 8 (ref 5–15)
BUN: 77 mg/dL — ABNORMAL HIGH (ref 8–23)
BUN: 79 mg/dL — ABNORMAL HIGH (ref 8–23)
CO2: 22 mmol/L (ref 22–32)
CO2: 23 mmol/L (ref 22–32)
Calcium: 8.1 mg/dL — ABNORMAL LOW (ref 8.9–10.3)
Calcium: 8.1 mg/dL — ABNORMAL LOW (ref 8.9–10.3)
Chloride: 103 mmol/L (ref 98–111)
Chloride: 101 mmol/L (ref 98–111)
Creatinine, Ser: 2.67 mg/dL — ABNORMAL HIGH (ref 0.61–1.24)
Creatinine, Ser: 2.79 mg/dL — ABNORMAL HIGH (ref 0.61–1.24)
GFR, Estimated: 22 mL/min — ABNORMAL LOW (ref >60)
GFR, Estimated: 21 mL/min — ABNORMAL LOW (ref >60)
Glucose, Bld: 244 mg/dL — ABNORMAL HIGH (ref 70–99)
Glucose, Bld: 282 mg/dL — ABNORMAL HIGH (ref 70–99)
Potassium: 5.6 mmol/L — ABNORMAL HIGH (ref 3.5–5.1)
Potassium: 5.3 mmol/L — ABNORMAL HIGH (ref 3.5–5.1)
Sodium: 133 mmol/L — ABNORMAL LOW (ref 135–145)
Sodium: 132 mmol/L — ABNORMAL LOW (ref 135–145)

## 2021-04-21 LAB — CBC
HCT: 42.9 % (ref 39.0–52.0)
Hemoglobin: 13.1 g/dL (ref 13.0–17.0)
MCH: 28.7 pg (ref 26.0–34.0)
MCHC: 30.5 g/dL (ref 30.0–36.0)
MCV: 93.9 fL (ref 80.0–100.0)
Platelets: 210 10*3/uL (ref 150–400)
RBC: 4.57 MIL/uL (ref 4.22–5.81)
RDW: 13.9 % (ref 11.5–15.5)
WBC: 11.2 10*3/uL — ABNORMAL HIGH (ref 4.0–10.5)
nRBC: 0 % (ref 0.0–0.2)

## 2021-04-21 LAB — GLUCOSE, CAPILLARY
Glucose-Capillary: 274 mg/dL — ABNORMAL HIGH (ref 70–99)
Glucose-Capillary: 305 mg/dL — ABNORMAL HIGH (ref 70–99)
Glucose-Capillary: 281 mg/dL — ABNORMAL HIGH (ref 70–99)
Glucose-Capillary: 225 mg/dL — ABNORMAL HIGH (ref 70–99)
Glucose-Capillary: 235 mg/dL — ABNORMAL HIGH (ref 70–99)

## 2021-04-21 LAB — POTASSIUM: Potassium: 5 mmol/L (ref 3.5–5.1)

## 2021-04-21 LAB — HEMOGLOBIN A1C
Hgb A1c MFr Bld: 7.2 % — ABNORMAL HIGH (ref 4.8–5.6)
Mean Plasma Glucose: 159.94 mg/dL

## 2021-04-21 SURGERY — TEMPORARY PACEMAKER
Anesthesia: LOCAL

## 2021-04-21 MED ORDER — INSULIN ASPART 100 UNIT/ML IV SOLN
5.0000 [IU] | Freq: Once | INTRAVENOUS | Status: AC
  Administered 2021-04-21: 5 [IU] via INTRAVENOUS

## 2021-04-21 MED ORDER — INSULIN ASPART 100 UNIT/ML IJ SOLN
0.0000 [IU] | Freq: Three times a day (TID) | INTRAMUSCULAR | Status: DC
  Administered 2021-04-21 (×2): 3 [IU] via SUBCUTANEOUS
  Administered 2021-04-22: 2 [IU] via SUBCUTANEOUS

## 2021-04-21 MED ORDER — DEXTROSE 50 % IV SOLN
1.0000 | Freq: Once | INTRAVENOUS | Status: AC
  Administered 2021-04-21: 50 mL via INTRAVENOUS
  Filled 2021-04-21: qty 50

## 2021-04-21 MED ORDER — ISOPROTERENOL HCL 0.2 MG/ML IJ SOLN
2.0000 ug/min | INTRAVENOUS | Status: DC
  Administered 2021-04-21: 2 ug/min via INTRAVENOUS
  Filled 2021-04-21 (×3): qty 5

## 2021-04-21 MED ORDER — SODIUM POLYSTYRENE SULFONATE 15 GM/60ML PO SUSP
30.0000 g | Freq: Once | ORAL | Status: AC
Start: 2021-04-21 — End: 2021-04-21
  Administered 2021-04-21: 30 g via ORAL
  Filled 2021-04-21: qty 120

## 2021-04-21 SURGICAL SUPPLY — 9 items
CABLE ADAPT PACING TEMP 12FT (ADAPTER) ×1 IMPLANT
CATH TEMPO TEMP PACE LEAD (CATHETERS) IMPLANT
CATHETER TEMPO TEMP PACE LEAD (CATHETERS) ×2
MAT PREVALON FULL STRYKER (MISCELLANEOUS) ×1 IMPLANT
PACK CARDIAC CATHETERIZATION (CUSTOM PROCEDURE TRAY) ×2 IMPLANT
SHEATH PINNACLE 6F 10CM (SHEATH) ×1 IMPLANT
SLEEVE REPOSITIONING LENGTH 30 (MISCELLANEOUS) ×1 IMPLANT
WIRE EMERALD 3MM-J .025X260CM (WIRE) ×1 IMPLANT
WIRE EMERALD 3MM-J .035X150CM (WIRE) ×1 IMPLANT

## 2021-04-21 NOTE — Progress Notes (Signed)
Zoll adjusted with primary RN Keshia to meet ordered parameters. Now set to 40bpm and 36mA. Sensing and capturing appropriately at this time.

## 2021-04-21 NOTE — H&P (View-Only) (Signed)
Progress Note  Patient Name: [name] Date of Encounter: 04/21/2021  [facility] Cardiologist: Rozann Lesches, MD   Subjective   Some increased pacing requirements over night  Inpatient Medications    Scheduled Meds:  aspirin EC  81 mg Oral Daily   Chlorhexidine Gluconate Cloth  6 each Topical Daily   feeding supplement  237 mL Oral BID BM   hydrALAZINE  25 mg Oral Q8H   insulin aspart  0-6 Units Subcutaneous TID WC   mouth rinse  15 mL Mouth Rinse BID   polyethylene glycol  17 g Oral Daily   predniSONE  20 mg Oral Q breakfast   simvastatin  40 mg Oral q1800   Continuous Infusions:  sodium chloride 250 mL (04/13/21 0915)   DOBUTamine Stopped (04/20/21 1102)   DOPamine 15 mcg/kg/min (04/21/21 0700)   PRN Meds: acetaminophen **OR** acetaminophen, fentaNYL (SUBLIMAZE) injection, guaiFENesin-dextromethorphan, ipratropium-albuterol, loperamide, ondansetron (ZOFRAN) IV, prochlorperazine   Vital Signs    Vitals:   04/21/21 0530 04/21/21 0600 04/21/21 0630 04/21/21 0700  BP: (!) 165/44 (!) 179/67 (!) 166/62 (!) 145/103  Pulse: (!) 38 (!) 51 75 74  Resp: 11 20 16 16   Temp:    98.2 F (36.8 C)  TempSrc:    Oral  SpO2: 95% 96% 98% 95%  Weight:      Height:        Intake/Output Summary (Last 24 hours) at 04/21/2021 0807 Last data filed at 04/21/2021 0700 Gross per 24 hour  Intake 1008.47 ml  Output 350 ml  Net 658.47 ml   Last 3 Weights 04/21/2021 04/20/2021 04/19/2021  Weight (lbs) 169 lb 1.5 oz 169 lb 1.5 oz 164 lb 10.9 oz  Weight (kg) 76.7 kg 76.7 kg 74.7 kg      Telemetry    Complete heart block, intermittent external pacing - Personally Reviewed  ECG    N/a - Personally Reviewed  Physical Exam   GEN: No acute distress.   Neck: No JVD Cardiac: brady Respiratory: Clear to auscultation bilaterally. GI: Soft, nontender, non-distended  MS: No edema; No deformity. Neuro:  Nonfocal  Psych: Normal affect   Labs    High Sensitivity Troponin:    Recent Labs  Lab 04/12/21 2143 04/12/21 2316 04/13/21 0142 04/13/21 0418  TROPONINIHS 162* 184* 189* 221*     Chemistry Recent Labs  Lab 04/16/21 0543 04/17/21 1415 04/18/21 1514 04/20/21 0425 04/21/21 0427  NA 140   < > 132* 136 133*  K 3.9   < > 5.2* 4.9 5.6*  CL 107   < > 101 105 103  CO2 24   < > 21* 25 22  GLUCOSE 126*   < > 350* 227* 244*  BUN 68*   < > 64* 78* 77*  CREATININE 2.54*   < > 2.14* 2.82* 2.67*  CALCIUM 8.1*   < > 8.0* 8.0* 8.1*  MG 2.3  --  2.3 2.2  --   GFRNONAA 23*   < > 28* 20* 22*  ANIONGAP 9   < > 10 6 8    < > = values in this interval not displayed.    Lipids No results for input(s): CHOL, TRIG, HDL, LABVLDL, LDLCALC, CHOLHDL in the last 168 hours.  Hematology Recent Labs  Lab 04/18/21 1514 04/20/21 0425 04/21/21 0427  WBC 7.8 10.2 11.2*  RBC 4.43 4.01* 4.57  HGB 13.2 11.9* 13.1  HCT 41.5 38.4* 42.9  MCV 93.7 95.8 93.9  MCH 29.8 29.7 28.7  MCHC 31.8 31.0 30.5  RDW 14.0 13.9 13.9  PLT 127* 119* 210   Thyroid No results for input(s): TSH, FREET4 in the last 168 hours.  BNP Recent Labs  Lab 04/18/21 0639  BNP 2,074.0*    DDimer No results for input(s): DDIMER in the last 168 hours.   Radiology    No results found.  Cardiac Studies     Patient Profile     87 y.o. male  CAD (s/p CABG in 2006 with LIMA-LAD, SVG-LCx and SVG-dRCA, repeat cath in 12/2020 showing 3/3 patent grafts and likely culprit was 80% LPAV and medical management recommended), HFrEF/ICM (EF at 45-50% in 2016, at 40-45% by repeat echo in 12/2020), [employer] ICD at [facility] (previously followed by Dr. Lovena Le and at [facility] in 2016 and not replaced due to improvement in his EF), history of prostate cancer, nephrolithiasis, MDS, HTN and HLD who presented with septic shock secondary to pneumonia. Cardiology was consulted for Afib and CHF.   Assessment & Plan    1.Pneumonia/Sepsis - per primary team. Transiently required pressors.    2. Afib - transient episode in  setting of pneumonia and sepsis - no prior history of afib, given transient episode in setting of acute systetmic illness has not been committed to anticoag. - will need outpatient 30 day monitor   3. Chronic systolic HF - actually presented hypovolemic - home meds held in setting of hypotension and sepsis - bps improved today. Significant wheezing, change his home coreg to toprol starting at 50mg  daily but significant bradycardia and now stopped.  Continue to hold losartan and aldactone given AKI.     4. CAD with prior CABG -  S/p CABG in 2006 with LIMA-LAD, SVG-LCx and SVG-dRCA, repeat cath in 12/2020 showing 3/3 patent grafts and likely culprit was 80% LPAV and medical management recommended. No anginal symptoms - continue ASA, statin.    5. AKI - per primary team   6. Sinus pauses/Asystole/Comeplete heart blck - episodes of sinus pauses, self terminating asystole, and complete heart block starting 04/18/21 after receiving toprol 50mg  x 1. Patient had been on coreg 12.5mg  bid at home, surprising response to toprol dose.  - started on dopamine drip, significant HTN SBPs 180s and 190s, dobutamine was started to wean dopamine. Intermittently required external pacing. - he is on dopamine 15, weaned off dobutamine.  - old AICD gen not replaced in the past due to EF improving.   - he is now off antibiotics for his pneumonia. We had hoped heart rates would normalize off beta blocker and we could avoid a new device in this elderly patient with recent pneumonia but they have not. He is the clearest mentally I have seen him today. Despite his age he lives independently and is relatively function as baseline.Will need to be transferred to [facility] to be evaluated by EP - I discussed with patient who is cognitive this AM as well as his son who agree for transfer and pacemaker evaluation.   For questions or updates, please contact [place] Please consult [email or web address] for contact info under         Signed, Carlyle Dolly, MD  04/21/2021, 8:07 AM

## 2021-04-21 NOTE — Progress Notes (Signed)
Patient almost continuously transcutaneously paced from 1900-2230. HR was then in the upper 30-low 40s most of the night and early morning. Intermittent pacing reoccurred this morning about 0500-0610. Pt HR increased to the 70s about 0610 with no pacing noted, attempted to decrease Dopamine gtt to 10 mcgs but HR dropped and pacing began commenced and Dopamine resumed at 15 mcgs. EKG this morning continues to show 3rd degree AV block.

## 2021-04-21 NOTE — Progress Notes (Signed)
PROGRESS NOTE  [name] DZH:[id number] DOB: 06/10/1926 DOA: 04/12/2021 PCP: [name], MD   Subjective: The patient was seen and examined this morning much more awake alert.  Had uneventful night as he was continuously being paced transcutaneously was, comfortable with pain,   Discussed with patient and cardiology who has spoken to his son agreed to be transferred to [name] for EP evaluation   Brief History:  87 year old male with a history of coronary artery disease status post CABG, systolic CHF with EF 41-96%, AICD, prostate cancer, MDS, hypertension, hyperlipidemia presenting with shortness of breath of 1 week duration.  History is supplemented by the patient's wife.  Apparently his shortness of breath continue to worsen over the past 2 to 3 days prior to admission.  He had a cough with cream-colored sputum.  There is no hemoptysis, fevers, chills, chest pain, nausea, vomiting, diarrhea, abdominal pain.  The patient has bilateral lower extremity edema which appears to be chronic.  Spouse states that his edema is actually a little bit better than usual.  Upon EMS activation.  The patient was noted to have oxygen saturation of 60% on room air.  He was placed on nonrebreather.  In the emergency department, patient was initially afebrile and hemodynamically stable.  He was placed on 6 L with oxygen saturation in the low 90s.  Chest x-ray showed bibasilar opacities, regular in the left.  BMP showed sodium sodium 137, potassium 3.7, bicarbonate 26, serum creatinine 1.53.  WBC 5.9, hemoglobin 13.8, platelets 86,000.   Notably, the patient had a hospital admission from 12/17/2020 to 12/20/2020 for chest pain concerning for unstable angina.  He was started on IV heparin.  He underwent cardiac catheterization.  . Repeat catheterization showed 3/3 patent grafts and the likely culprit lesion was 80% stenosis in the distal LPAV prior to bifurcation into LPL 1 and LPL 2 which was filled  from retrograde flow from the SVG to OM 2 and was not approachable for PCI and medical management was recommended. Coreg was increased to 12.5 mg twice daily and Losartan increased to 50 mg daily. He was continued on ASA 81 mg daily and Crestor 20 mg daily with consideration of adding Spironolactone or an SGLT2 inhibitor as an outpatient if renal function remained stable (was at 1.05 on the day of discharge).  He was admitted to the ICU.  He continued to have hypoxia and was placed on BiPAP.  He subsequently developed hypotension requiring fluid resuscitation and vasopressor support.  UA was negative for pyuria.   Assessment/Plan:  Symptomatic bradycardia/Complete Heart Block (asystole/sinus pause) With history of paroxysmal atrial fibrillation (not on chronic anticoagulation therapy -Continue with patient transcutaneously overnight still on dopamine drip -Uncomfortable with the pain pain medication was adjusted overnight  He is awake alert oriented denies any chest pain  Discussed with the patient and cardiology was spoken to patient's son all of and agreeable to transfer patient to [name], ICU, EP cardiology has been notified    -04/18/21--bradycardic into 20-30 HR with hypotension--atropine 0.5 mg x 3 -pt received metoprolol this am>>d/c -cont.  dopamine drip in ICU -Cardiology, Dr. [name]... Continuing dopamine drip at 12.5 today, and dobutamine External pacemaker in place -  pacing overnight and early this morning Cardiology recommending transfer to [name] for EP evaluation  -Per cardiology -to transfer the patient to [name] on 04/21/2021 for EP considering generator change -has ICD in place   Hyperkalemia  Overnight he was treated with D50 and insulin -This morning potassium still 5.6, 30 g of Kayexalate will be given   sepsis -Resolved  -Sepsis physiology resolved with exception of bradycardia -On admission met sepsis criteria - with tachypnea, fever, and respiratory  failure -Secondary to pneumonia -PCT 43.33>>57.55 -Follow blood cultures--neg to date -UA negative for pyuria -remains of neosynephrine 25 mcg>>weaned off 04/14/21 -continue solucortef>>wean off -As cultures remain negative, anticipating discontinuing antibiotics Discontinuing antibiotics today 04/20/2021, total of 8 days of antibiotics   Lobar pneumonia -Improved -Personally reviewed chest x-ray--right greater than left basilar opacity -Completed antibiotics of cefepime and azithromycin -MRSA screen neg -PCT 43>>57.55   Acute respiratory failure with hypoxia and hypercarbia -Secondary to pneumonia --much improved, stable -Currently on BiPAP FiO2 0.5 -Wean back to room air as tolerated for saturation greater 90% -weaned to 3F>>3.8V>>2N   Chronic systolic CHF/ischemic cardiomyopathy -12/17/2020 echo EF 40 to 45%, +WMA -Holding carvedilol, losartan, spironolactone secondary to hypotension -Holding furosemide temporarily     Essential hypertension -Holding losartan, carvedilol, spironolactone secondary to hypotension -Blood pressure has stabilized--on dopamine drip -Hypotensive today, monitoring closely   Coronary artery disease -No chest pain presently -Continue aspirin -Holding carvedilol, spironolactone, losartan secondary to hypotension -resume ASA and statin   Acute on chronic renal failure--CKD stage IIIa -Worsening creatinine function likely due to bradycardia, post treatment for sepsis Hypertension and was on diuretic furosemide -Baseline creatinine 1.1-1.3 -serum creatinine peaking 2.83>>2.97>>2.54>>2.13 >> 2.82 -renal US--no hydronephrosis -Monitor closely, avoid nephrotoxins    Hyperlipidemia -continue statin   Tobacco abuse in remission -50 pk yr -quit 20 years ago   Moderate malnutrition -continue supplements  Goals of Care -updated spouse -discussed GOC -confirmed DNR -palliative consult in am       Status is: Inpatient   Remains inpatient  appropriate because: hemodynamic instability, severity of illness requiring BiPAP and IV abx, requiring IV medication for severe symptomatic bradycardia       Family Communication:   spouse and his Son was updated at bedside12/8/22  Consultants:  Cardiology  Code Status:  DNR  DVT Prophylaxis:   Heparin      Procedures: As Listed in Progress Note Above   Antibiotics: Cefepime 12/1>> 12 /8 Azithro 12/1>> 12/7     Objective: Vitals:   04/21/21 0530 04/21/21 0600 04/21/21 0630 04/21/21 0700  BP: (!) 165/44 (!) 179/67 (!) 166/62 (!) 145/103  Pulse: (!) 38 (!) 51 75 74  Resp: _0 Temp:    98.2 F (36.8 C)  TempSrc:    Oral  SpO2: 95% 96% 98% 95%  Weight:      Height:        Intake/Output Summary (Last 24 hours) at 04/21/2021 0903 Last data filed at 04/21/2021 0700 Gross per 24 hour  Intake 985.05 ml  Output 350 ml  Net 635.05 ml   Weight change: 0 kg       Physical Exam:   General:  Alert, oriented, cooperative, no distress;   HEENT:  Normocephalic, PERRL, otherwise with in Normal limits   Neuro:  CNII-XII intact. , normal motor and sensation, reflexes intact   Lungs:   Clear to auscultation BL, Respirations unlabored, no wheezes / crackles  Cardio:    S1/S2, RRR, No murmure, No Rubs or Gallops   Abdomen:   Soft, non-tender, bowel sounds active all four quadrants,  no guarding or peritoneal signs.  Muscular skeletal:  Limited exam - in bed, able to move all 4 extremities, Normal strength,  2+ pulses,  symmetric, ++_2 pitting edema  Skin:  Dry, warm to touch, negative for any Rashes,  Wounds: Please see nursing documentation  Pressure Injury 04/19/21 Buttocks Right;Left Stage 1 -  Intact skin with non-blanchable redness of a localized area usually over a bony prominence. (Active)  04/19/21 2000  Location: Buttocks  Location Orientation: Right;Left  Staging: Stage 1 -  Intact skin with non-blanchable redness of a localized area usually over a bony  prominence.  Wound Description (Comments):   Present on Admission:                Reviewed: I have personally reviewed following labs and imaging studies Basic Metabolic Panel: Recent Labs  Lab 04/16/21 0543 04/17/21 1415 04/18/21 0639 04/18/21 1514 04/20/21 0425 04/21/21 0427  NA 140 134* 137 132* 136 133*  K 3.9 4.6 4.4 5.2* 4.9 5.6*  CL 107 104 106 101 105 103  CO2 _0 21* 25 22  GLUCOSE 126* 287* 235* 350* 227* 244*  BUN 68* 65* 65* 64* 78* 77*  CREATININE 2.54* 2.13* 2.05* 2.14* 2.82* 2.67*  CALCIUM 8.1* 8.1* 8.3* 8.0* 8.0* 8.1*  MG 2.3  --   --  2.3 2.2  --   PHOS  --   --   --   --  3.0  --    Liver Function Tests: No results for input(s): AST, ALT, ALKPHOS, BILITOT, PROT, ALBUMIN in the last 168 hours.  No results for input(s): LIPASE, AMYLASE in the last 168 hours. No results for input(s): AMMONIA in the last 168 hours. Coagulation Profile: No results for input(s): INR, PROTIME in the last 168 hours. CBC: Recent Labs  Lab 04/17/21 1415 04/18/21 0639 04/18/21 1514 04/20/21 0425 04/21/21 0427  WBC 4.1 7.6 7.8 10.2 11.2*  HGB 13.0 11.9* 13.2 11.9* 13.1  HCT 40.2 37.3* 41.5 38.4* 42.9  MCV 93.3 92.6 93.7 95.8 93.9  PLT 84* 89* 127* 119* 210   Cardiac Enzymes: No results for input(s): CKTOTAL, CKMB, CKMBINDEX, TROPONINI in the last 168 hours. BNP: Invalid input(s): POCBNP CBG: Recent Labs  Lab 04/17/21 2112 04/18/21 0721 04/18/21 1104 04/18/21 1456 04/21/21 0819  GLUCAP 391* 218* 243* 280* 274*   HbA1C: No results for input(s): HGBA1C in the last 72 hours. Urine analysis:    Component Value Date/Time   COLORURINE YELLOW 04/13/2021 0348   APPEARANCEUR CLEAR 04/13/2021 0348   LABSPEC 1.025 04/13/2021 0348   PHURINE 5.0 04/13/2021 0348   GLUCOSEU NEGATIVE 04/13/2021 0348   HGBUR SMALL (A) 04/13/2021 0348   BILIRUBINUR NEGATIVE 04/13/2021 0348   KETONESUR NEGATIVE 04/13/2021 0348   PROTEINUR 100 (A) 04/13/2021 0348   NITRITE  NEGATIVE 04/13/2021 0348   LEUKOCYTESUR SMALL (A) 04/13/2021 0348   Sepsis Labs: _1 (procalcitonin:4,lacticidven:4) ) Recent Results (from the past 240 hour(s))  Resp Panel by RT-PCR (Flu A&B, Covid) Nasopharyngeal Swab     Status: None   Collection Time: 04/12/21  2:59 PM   Specimen: Nasopharyngeal Swab; Nasopharyngeal(NP) swabs in vial transport medium  Result Value Ref Range Status   SARS Coronavirus 2 by RT PCR NEGATIVE NEGATIVE Final    Comment: (NOTE) SARS-CoV-2 target nucleic acids are NOT DETECTED.  The SARS-CoV-2 RNA is generally detectable in upper respiratory specimens during the acute phase of infection. The lowest concentration of SARS-CoV-2 viral copies this assay can detect is 138 copies/mL. A negative result does not preclude SARS-Cov-2 infection and should not be used as the sole basis for treatment or other patient management decisions.  A negative result may occur with  improper specimen collection/handling, submission of specimen other than nasopharyngeal swab, presence of viral mutation(s) within the areas targeted by this assay, and inadequate number of viral copies(<138 copies/mL). A negative result must be combined with clinical observations, patient history, and epidemiological information. The expected result is Negative.  Fact Sheet for Patients:  EntrepreneurPulse.com.au  Fact Sheet for Healthcare Providers:  IncredibleEmployment.be  This test is no t yet approved or cleared by the [place] FDA and  has been authorized for detection and/or diagnosis of SARS-CoV-2 by FDA under an Emergency Use Authorization (EUA). This EUA will remain  in effect (meaning this test can be used) for the duration of the COVID-19 declaration under Section 564(b)(1) of the Act, 21 U.S.C.section 360bbb-3(b)(1), unless the authorization is terminated  or revoked sooner.       Influenza A by PCR NEGATIVE NEGATIVE Final    Influenza B by PCR NEGATIVE NEGATIVE Final    Comment: (NOTE) The Xpert Xpress SARS-CoV-2/FLU/RSV plus assay is intended as an aid in the diagnosis of influenza from Nasopharyngeal swab specimens and should not be used as a sole basis for treatment. Nasal washings and aspirates are unacceptable for Xpert Xpress SARS-CoV-2/FLU/RSV testing.  Fact Sheet for Patients: EntrepreneurPulse.com.au  Fact Sheet for Healthcare Providers: IncredibleEmployment.be  This test is not yet approved or cleared by the [place] FDA and has been authorized for detection and/or diagnosis of SARS-CoV-2 by FDA under an Emergency Use Authorization (EUA). This EUA will remain in effect (meaning this test can be used) for the duration of the COVID-19 declaration under Section 564(b)(1) of the Act, 21 U.S.C. section 360bbb-3(b)(1), unless the authorization is terminated or revoked.  Performed at [facility], [address], [place], [place] [address]   MRSA Next Gen by PCR, Nasal     Status: None   Collection Time: 04/12/21  7:50 PM   Specimen: Nasal Mucosa; Nasal Swab  Result Value Ref Range Status   MRSA by PCR Next Gen NOT DETECTED NOT DETECTED Final    Comment: (NOTE) The GeneXpert MRSA Assay (FDA approved for NASAL specimens only), is one component of a comprehensive MRSA colonization surveillance program. It is not intended to diagnose MRSA infection nor to guide or monitor treatment for MRSA infections. Test performance is not FDA approved in patients less than 24 years old. Performed at [facility], [address] [place], [place] [address]   Urine Culture     Status: Abnormal   Collection Time: 04/13/21  3:48 AM   Specimen: Urine, Clean Catch  Result Value Ref Range Status   Specimen Description   Final    URINE, CLEAN CATCH Performed at [facility], [address], [place], [place] [address]    Special Requests   Final    NONE Performed at [facility], [address] [place], [place] [address]    Culture MULTIPLE SPECIES PRESENT, SUGGEST RECOLLECTION (A)  Final   Report Status 04/14/2021 FINAL  Final  Culture, blood (Routine X 2) w Reflex to ID Panel     Status: None   Collection Time: 04/13/21  4:18 AM   Specimen: Right Antecubital; Blood  Result Value Ref Range Status   Specimen Description RIGHT ANTECUBITAL  Final   Special Requests   Final    BOTTLES DRAWN AEROBIC AND ANAEROBIC Blood Culture adequate volume   Culture   Final    NO GROWTH 5 DAYS Performed at [facility], [address],  [place], [place] [address]    Report Status 04/18/2021 FINAL  Final  Culture, blood (Routine X 2) w Reflex to ID Panel     Status: None   Collection Time: 04/13/21  4:18 AM   Specimen: BLOOD RIGHT HAND  Result Value Ref Range Status   Specimen Description BLOOD RIGHT HAND  Final   Special Requests   Final    BOTTLES DRAWN AEROBIC AND ANAEROBIC Blood Culture adequate volume   Culture   Final    NO GROWTH 5 DAYS Performed at [facility], [address] [place], [place] [address]    Report Status 04/18/2021 FINAL  Final     Scheduled Meds:  aspirin EC  81 mg Oral Daily   Chlorhexidine Gluconate Cloth  6 each Topical Daily   feeding supplement  237 mL Oral BID BM   hydrALAZINE  25 mg Oral Q8H   insulin aspart  0-6 Units Subcutaneous TID WC   mouth rinse  15 mL Mouth Rinse BID   polyethylene glycol  17 g Oral Daily   predniSONE  20 mg Oral Q breakfast   simvastatin  40 mg Oral q1800   sodium polystyrene  30 g Oral Once   Continuous Infusions:  sodium chloride 250 mL (04/13/21 0915)   DOPamine 15 mcg/kg/min (04/21/21 0700)    Procedures/Studies: NM Pulmonary Perfusion  Result Date: 04/13/2021 CLINICAL DATA:  Respiratory failure. Acute hypoxic respiratory failure. EXAM: NUCLEAR MEDICINE PERFUSION LUNG SCAN TECHNIQUE: Perfusion images were obtained in multiple projections after intravenous injection of radiopharmaceutical.  Ventilation scans intentionally deferred if perfusion scan and chest x-ray adequate for interpretation during COVID 19 epidemic. RADIOPHARMACEUTICALS:  4.2 mCi Tc-41mMAA IV COMPARISON:  Chest radiography same day FINDINGS: No evidence of segmental or subsegmental perfusion anomalies. Small amount of fluid seen in the fissures. Findings PICC low probability of pulmonary emboli. IMPRESSION: No segmental or subsegmental perfusion anomalies. Fissure signs which could be seen with small effusions/early congestive heart failure. Study predicts low probability of pulmonary emboli. Electronically Signed   By: MNelson ChimesM.D.   On: 04/13/2021 13:36   [place]  Result Date: 04/14/2021 CLINICAL DATA:  Renal dysfunction EXAM: RENAL / URINARY TRACT ULTRASOUND COMPLETE COMPARISON:  CT done on 11/04/2020 FINDINGS: Right Kidney: Renal measurements: 9.8 x 5.3 x 3.9 cm = volume: 105.1 mL. There is no hydronephrosis. There is interval resolution of right hydronephrosis since the CT done on 11/04/2020. There are multiple smooth marginated anechoic lesions in the right kidney largest measuring 4.9 cm in the lower pole. Left Kidney: Renal measurements: 10.6 x 4.6 x 5.6 cm = volume: 141.4 mL. There is mild lobulation in the margin. Technologist observed 7 mm hyperechoic structure in the upper pole which was not distinctly seen in the previous CT and may be a partial volume averaging artifact. There is possible 10 mm cyst in the lower pole. Left adrenal is not visualized in the submitted images. Bladder: Urinary bladder is not distended. Other: None. IMPRESSION: There is no hydronephrosis.  Bilateral renal cysts. Electronically Signed   By: PElmer PickerM.D.   On: 04/14/2021 16:16   DG Chest Port 1 View  Result Date: 04/18/2021 CLINICAL DATA:  Increased shortness of breath EXAM: PORTABLE CHEST 1 VIEW COMPARISON:  04/13/2021 FINDINGS: Unchanged mild-to-moderate cardiomegaly, with mild central vascular fullness. No definite  edema. Status post median sternotomy and CABG. Left chest cardiac device, with unchanged position of the generator or wires. Slightly increased opacities in the retrocardiac left lower lobe  and right lung base. Unchanged small right pleural effusion. No left pleural effusion. Remote right rib fracture.  No acute osseous abnormality. IMPRESSION: Slightly increased opacities in the lung bases, which are nonspecific but concerning for infection. Stable right pleural effusion and cardiomegaly. Electronically Signed   By: Merilyn Baba M.D.   On: 04/18/2021 13:31   DG CHEST PORT 1 VIEW  Result Date: 04/13/2021 CLINICAL DATA:  Difficulty breathing. EXAM: PORTABLE CHEST 1 VIEW COMPARISON:  Portable chest yesterday at 3:09 p.m. FINDINGS: 4:47 a.m., 04/13/2021. There is mild-to-moderate cardiomegaly. Mild central vascular fullness continues to be noted without appreciable edema. Sternotomy and CABG changes are redemonstrated as well as a single lead left chest cardiac assist device and wire insertion. There is increased streaky atelectasis or infiltrate in the retrocardiac left lower lobe and increased patchy opacities in the right lung base concerning for pneumonia with a small underlying right pleural effusion. Remaining lung fields remain clear. No other interval changes. Osteopenia. IMPRESSION: Increasing opacity in the left infrahilar retrocardiac lower lobe and right lung base concerning for pneumonia with worsening. Stable small right pleural effusion. Cardiomegaly. Electronically Signed   By: Telford Nab M.D.   On: 04/13/2021 05:49   DG Chest Port 1 View  Result Date: 04/12/2021 CLINICAL DATA:  SOB EXAM: PORTABLE CHEST 1 VIEW COMPARISON:  August 2022 FINDINGS: Implantable cardiac device with stable position of the lead. The heart appears within normal limits for size. Probable small right pleural effusion. No left pleural effusion. No pneumothorax. Heterogeneous right lower lung airspace opacities.  Osteopenia with healing/chronic right rib fractures, present on previous exam. Median sternotomy wires. IMPRESSION: There are new right lower lung heterogeneous airspace opacities with small right pleural effusion. Electronically Signed   By: Albin Felling M.D.   On: 04/12/2021 15:17   DG Chest Port 1V same Day  Result Date: 04/18/2021 CLINICAL DATA:  Post central line placement EXAM: PORTABLE CHEST 1 VIEW COMPARISON:  Film from earlier in the same day. FINDINGS: Cardiac shadow is enlarged. Defibrillator is again noted. Right jugular central line is noted with the tip at the cavoatrial junction. No pneumothorax is noted. Stable opacities in the bases are not seen right greater than left with small right-sided pleural effusion. IMPRESSION: No pneumothorax following central line placement. No significant interval change from the prior exam is noted. Electronically Signed   By: Inez Catalina M.D.   On: 04/18/2021 19:00   [place] EKG SITE RITE  Result Date: 04/18/2021 If Site Rite image not attached, placement could not be confirmed due to current cardiac rhythm.  [place] EKG SITE RITE  Result Date: 04/18/2021 If Site Rite image not attached, placement could not be confirmed due to current cardiac rhythm.      The patient is critically ill with multiple organ systems failure and requires high complexity decision making for assessment and support, frequent evaluation and titration of therapies, application of advanced monitoring technologies and extensive interpretation of multiple databases.  Critical care time - 45 mins.    Deatra James, MD   [facility]  If 7PM-7AM, please contact night-coverage [email or web address] Password [email or web address] 04/21/2021, 9:03 AM   LOS: 9 days

## 2021-04-21 NOTE — Progress Notes (Signed)
1850 -Report received from [place], [place] .  All questions answered. 1900 - Report given to PM RN.  All questions answered.

## 2021-04-21 NOTE — Progress Notes (Signed)
eLink Physician-Brief Progress Note Patient Name: [name] DOB: [birth year] MRN: [id number]   Date of Service  04/21/2021  HPI/Events of Note  87 yr old male admitted with pneumonia 11/30 who has AICD in place now with bradycardia requiring chronotropic support.  Transferred to [facility] for EP evaluation.    eICU Interventions  Chart reviewed.     Intervention Category Evaluation Type: New Patient Evaluation  [name], P 04/21/2021, 9:44 PM

## 2021-04-21 NOTE — Progress Notes (Signed)
Report called to Isabel [place].

## 2021-04-21 NOTE — Progress Notes (Signed)
NAME:  [name], MRN:  [id number], DOB:  1927/04/27, LOS: 9 ADMISSION DATE:  04/12/2021, CONSULTATION DATE:  04/21/2021 REFERRING MD:  Dr. Roger Shelter, CHIEF COMPLAINT:  Complete Heart Block    History of Present Illness:  87 year old male presents to ED on 11/30 with shortness of breath and productive cough for last 2-3 days. EMS reports oxygen saturation 60% on room air, placed on NRB with improvement. On arrival to ED CXR with right lower lobe airspace opacity and small right pleural effusion, BNP 1568, continued tachypnea, placed on BiPAP. Throughout stay treated for Sepsis PNA with cefepime/azithromycin for 8 days, required low dose vasopressors however has remained off since 12/2, completed course of solu-cortef. Complicated by symptomatic bradycardia, Cardiology consulted, despite dopamine gtt external pacing required 12/8. Decision made to transfer to [name] for EP consult for consideration for generator change.   PCCM consulted for management at [facility]. Now transferred to [facility].  Pertinent  Medical History  Cardiomyopathy, HTN, Systolic HF (EF 73-41), CAD s/p CABG 12/2020, s/p AICD, CKD stage 3 (baseline crt 1-1.3), prostate cancer   Significant Hospital Events: Including procedures, antibiotic start and stop dates in addition to other pertinent events   11/30 > Presents to [facility] ED  12/6 bradycardia heart rate 20s and hypotension requiring atropine 12/8 > external pacing placed for symptomatic bradycardia despite dopamine. 12/9 > transferred to [facility]   Interim History / Subjective:  Transferred to [facility]  On dobutamine 36mcg/kg/min  2LNC  Subjective: Complaints of chest pain with pacing, denies SOB, "would like to see cardiologist"  Objective   Blood pressure (!) 152/54, pulse (!) 57, temperature 98.3 F (36.8 C), temperature source Axillary, resp. rate 20, height 5\' 4"  (1.626 m), weight 76.7 kg, SpO2 96 %.        Intake/Output Summary (Last 24 hours) at 04/21/2021 2217 Last  data filed at 04/21/2021 2200 Gross per 24 hour  Intake 1207.53 ml  Output 850 ml  Net 357.53 ml   Filed Weights   04/19/21 0500 04/20/21 0423 04/21/21 0413  Weight: 74.7 kg 76.7 kg 76.7 kg    Examination: General: In bed, some distress, uncomfortable HEENT: MM pink/moist, anicteric, atraumatic Neuro: RASS 0, PERRL 66mm, GCS 15 CV: S1S2, paced, no m/r/g appreciated PULM:  clear in the upper lobes, clear in the lower lobes, trachea midline, chest expansion symmetric GI: soft, bsx4 active, non-tender   Extremities: warm/dry, no pretibial edema, capillary refill less than 3 seconds  Skin:  no rashes or lesions noted  Resolved Hospital Problem list   Acute Hypoxic Respiratory Failure in setting of CAP  Septic Shock   Assessment & Plan:  Complete Heart Block requiring external pacing  -H/O AICD placement, issues reported with generator, not replaced in past due to improved EF  -started on Dopamine gtt with significant HTN > placed on Dobutamine with plan to wean Dopamine -External Pacing started 12/8: VVI rate 40, milliamps of 90. Plan -Transferred to [facility] for EP Consult, Cardiology hoped HR would recover off beta blockade but he remains brady. Cardiology notified of arrival by nursing staff. To see patient -continue dopamine gtt, aim for HR 40 & above -Starting isoproterenol  -Continue external pacing -PRN fentanyl for pain  Hypertension -PO hydralazine   New onset Transient A.Fib  Plan -Plan for OP 30 day monitor   CAD s/p CABG  Chronic Systolic Heart Failure -EF 40-45 12/2020  HLD  Plan -Continue ASA -Continue Statin  -Continue to hold Coreg, Losartan, Spironolactone due  to borderline hypotension and AKI  Hyperkalemia-Improved Acute on CKD stage 3 -Baseline 1-1.3 -K 5.3>5.0 -Given Kayexalate this AM, Plan -Trend BMP -Follow up K level  -Avoid nephrotoxic agents -Continue holding losartan & aldactone  Bilateral Lower Lobe PNA-resolved  -S/P 8 days of  Cefepime/Azithromycin  -dc prednisone    Best Practice (right click and "Reselect all SmartList Selections" daily)   Diet/type: Regular consistency (see orders) DVT prophylaxis: prophylactic heparin  GI prophylaxis: N/A Lines: Central line and yes and it is still needed Foley:  N/A Code Status:  DNR Last date of multidisciplinary goals of care discussion [12/9 continue DNR]  Labs   CBC: Recent Labs  Lab 04/17/21 1415 04/18/21 0639 04/18/21 1514 04/20/21 0425 04/21/21 0427  WBC 4.1 7.6 7.8 10.2 11.2*  HGB 13.0 11.9* 13.2 11.9* 13.1  HCT 40.2 37.3* 41.5 38.4* 42.9  MCV 93.3 92.6 93.7 95.8 93.9  PLT 84* 89* 127* 119* 824    Basic Metabolic Panel: Recent Labs  Lab 04/16/21 0543 04/17/21 1415 04/18/21 0639 04/18/21 1514 04/20/21 0425 04/21/21 0427 04/21/21 0847 04/21/21 1312  NA 140   < > 137 132* 136 133* 132*  --   K 3.9   < > 4.4 5.2* 4.9 5.6* 5.3* 5.0  CL 107   < > 106 101 105 103 101  --   CO2 24   < > 24 21* 25 22 23   --   GLUCOSE 126*   < > 235* 350* 227* 244* 282*  --   BUN 68*   < > 65* 64* 78* 77* 79*  --   CREATININE 2.54*   < > 2.05* 2.14* 2.82* 2.67* 2.79*  --   CALCIUM 8.1*   < > 8.3* 8.0* 8.0* 8.1* 8.1*  --   MG 2.3  --   --  2.3 2.2  --   --   --   PHOS  --   --   --   --  3.0  --   --   --    < > = values in this interval not displayed.   GFR: Estimated Creatinine Clearance: 15.8 mL/min (A) (by C-G formula based on SCr of 2.79 mg/dL (H)). Recent Labs  Lab 04/18/21 0639 04/18/21 1514 04/20/21 0425 04/21/21 0427  WBC 7.6 7.8 10.2 11.2*    Liver Function Tests: No results for input(s): AST, ALT, ALKPHOS, BILITOT, PROT, ALBUMIN in the last 168 hours. No results for input(s): LIPASE, AMYLASE in the last 168 hours. No results for input(s): AMMONIA in the last 168 hours.  ABG    Component Value Date/Time   PHART 7.347 (L) 04/13/2021 0030   PCO2ART 49.6 (H) 04/13/2021 0030   PO2ART 84.0 04/13/2021 0030   HCO3 25.0 04/13/2021 0030   O2SAT  94.4 04/13/2021 0030     Coagulation Profile: No results for input(s): INR, PROTIME in the last 168 hours.  Cardiac Enzymes: No results for input(s): CKTOTAL, CKMB, CKMBINDEX, TROPONINI in the last 168 hours.  HbA1C: Hgb A1c MFr Bld  Date/Time Value Ref Range Status  04/21/2021 04:27 AM 7.2 (H) 4.8 - 5.6 % Final    Comment:    (NOTE) Pre diabetes:          5.7%-6.4%  Diabetes:              >6.4%  Glycemic control for   <7.0% adults with diabetes   11/07/2020 10:35 AM 5.9 (H) 4.8 - 5.6 % Final    Comment:    (  NOTE)         Prediabetes: 5.7 - 6.4         Diabetes: >6.4         Glycemic control for adults with diabetes: <7.0     CBG: Recent Labs  Lab 04/21/21 0819 04/21/21 1111 04/21/21 1641 04/21/21 2006 04/21/21 2141  GLUCAP 274* 305* 281* 225* 235*    Review of Systems:   Positives in bold  Gen: fever, chills, weight change, fatigue, night sweats HEENT:  blurred vision, double vision, hearing loss, tinnitus, sinus congestion, rhinorrhea, sore throat, neck stiffness, dysphagia PULM:  shortness of breath, cough, sputum production, hemoptysis, wheezing CV: chest pain, edema, orthopnea, paroxysmal nocturnal dyspnea, palpitations GI:  abdominal pain, nausea, vomiting, diarrhea, hematochezia, melena, constipation, change in bowel habits GU: dysuria, hematuria, polyuria, oliguria, urethral discharge Endocrine: hot or cold intolerance, polyuria, polyphagia or appetite change Derm: rash, dry skin, scaling or peeling skin change Heme: easy bruising, bleeding, bleeding gums Neuro: headache, numbness, weakness, slurred speech, loss of memory or consciousness   Past Medical History:  He,  has a past medical history of BPH (benign prostatic hypertrophy), Chronic systolic heart failure ([place]), Coronary artery disease, Essential hypertension, Hyperlipidemia, ICD (implantable cardiac defibrillator) in place, Ischemic cardiomyopathy, Myelodysplasia, low grade ([place])  (11/27/2015), Nephrolithiasis, Prostate cancer ([place]), and Vitamin B 12 deficiency (07/27/2016).   Surgical History:   Past Surgical History:  Procedure Laterality Date   CARDIAC DEFIBRILLATOR PLACEMENT     COLONOSCOPY     COLONOSCOPY N/A 08/19/2013   Procedure: COLONOSCOPY;  Surgeon: Rogene Houston, MD;  Location[facility];  Service: Endoscopy;  Laterality: N/A;  8   CORONARY ARTERY BYPASS GRAFT     8/06 with left anterior descending artery, saphenous vein graft to circumflex, saphenous vein graft to distal right coronary artery   CYSTOSCOPY/URETEROSCOPY/HOLMIUM LASER/STENT PLACEMENT Right 11/07/2020   Procedure: CYSTOSCOPY/URETEROSCOPY/HOLMIUM LASER/STENT PLACEMENT;  Surgeon: Alexis Frock, MD;  Location: [facility];  Service: Urology;  Laterality: Right;   HERNIA REPAIR     LEFT HEART CATH AND CORS/GRAFTS ANGIOGRAPHY N/A 12/19/2020   Procedure: LEFT HEART CATH AND CORS/GRAFTS ANGIOGRAPHY;  Surgeon: Leonie Man, MD;  Location: [place] CV LAB;  Service: Cardiovascular;  Laterality: N/A;   POLYPECTOMY     PROSTATE SURGERY       Social History:   reports that he quit smoking about 28 years ago. His smoking use included cigarettes. He has a 11.00 pack-year smoking history. He has never used smokeless tobacco. He reports that he does not drink alcohol and does not use drugs.   Family History:  His family history includes Colon cancer in his father; Coronary artery disease in an other family member.   Allergies Allergies  Allergen Reactions   Codeine Nausea And Vomiting     Home Medications  Prior to Admission medications   Medication Sig Start Date End Date Taking? Authorizing Provider  acetaminophen (TYLENOL) 500 MG tablet Take 1 tablet (500 mg total) by mouth every 6 (six) hours as needed for mild pain or fever. 07/14/15  Yes Carmin Muskrat, MD  aspirin 81 MG chewable tablet Chew 81 mg by mouth daily.   Yes [provider]  carvedilol (COREG) 12.5 MG tablet Take  1 tablet (12.5 mg total) by mouth 2 (two) times daily with a meal. 03/30/21  Yes Strader, [place] M, PA-C  Cholecalciferol (VITAMIN D3) 1000 UNITS CAPS Take 1 capsule by mouth daily.   Yes [provider]  cyanocobalamin (,VITAMIN B-12,)  1000 MCG/ML injection ADMINISTER 1 ML IN THE MUSCLE 1 TIME AS DIRECTED Patient taking differently: Inject 1,000 mcg into the muscle every 30 (thirty) days. 03/31/21  Yes Pennington, Rebekah M, PA-C  diphenhydrAMINE (BENADRYL) 25 MG tablet Take 25 mg by mouth at bedtime as needed for sleep.   Yes [provider]  losartan (COZAAR) 50 MG tablet Take 1 tablet (50 mg total) by mouth daily. 01/03/21 07/02/21 Yes Strader, Fransisco Hertz, PA-C  nitroGLYCERIN (NITROSTAT) 0.4 MG SL tablet Place 1 tablet (0.4 mg total) under the tongue every 5 (five) minutes as needed. 06/08/19  Yes Imogene Burn, PA-C  simvastatin (ZOCOR) 40 MG tablet TAKE 1 TABLET BY MOUTH EVERY DAY IN THE EVENING Patient taking differently: Take 40 mg by mouth daily. 03/09/21  Yes Satira Sark, MD  spironolactone (ALDACTONE) 25 MG tablet Take 0.5 tablets (12.5 mg total) by mouth daily. 03/30/21 06/28/21 Yes Strader, Fransisco Hertz, PA-C  vitamin C (ASCORBIC ACID) 500 MG tablet Take 500 mg by mouth daily.     Yes [provider]     Critical care time: n/a  [employer]., MSN, APRN, AGACNP-BC [facility]  04/21/2021 , 10:17 PM  Please see [email or web address] for pager details  If no response, please call [phone] After hours, please call Elink at [phone]

## 2021-04-21 NOTE — Progress Notes (Signed)
Pt arrived to [facility] from [facility] via [facility]. Zoll attached to pt, demand-pacing 60bpm, 48mA.

## 2021-04-21 NOTE — Progress Notes (Signed)
Inpatient Diabetes Program Recommendations  AACE/ADA: New Consensus Statement on Inpatient Glycemic Control   Target Ranges:  Prepandial:   less than 140 mg/dL      Peak postprandial:   less than 180 mg/dL (1-2 hours)      Critically ill patients:  140 - 180 mg/dL    Latest Reference Range & Units 04/17/21 07:38 04/17/21 11:13 04/17/21 16:17 04/17/21 21:12 04/18/21 07:21 04/18/21 11:04 04/18/21 14:56  Glucose-Capillary 70 - 99 mg/dL 140 (H) 173 (H) 320 (H) 391 (H) 218 (H) 243 (H) 280 (H)    Review of Glycemic Control  Diabetes history: No Outpatient Diabetes medications: NA Current orders for Inpatient glycemic control: None; Prednisone 20 mg QAM  Inpatient Diabetes Program Recommendations:    Insulin: If appropriate for patient's plan of care and if steroids are continued, may want to consider ordering Novolog 0-6 units AC&HS.  NOTE: Noted consult for diabetes coordinator by RN for "Elevated glucose levels on AM labs for a few days". Chart reviewed. Per chart, patient has no hx of DM. Admitted on 04/12/21 with acute on chronic CHF, respiratory failure and patient developed symptomatic bradycardia requiring transcutaneous pacing and dopamine drip. Patient is ordered steroids and Ensure supplements which are contributing to noted hyperglycemia. Noted consult for palliative care. If appropriate for patient situation and steroids continued, may want to order CBGs AC&HS with Novolog 0-6 units AC&HS. Signing off consult, please reconsult if needed.  Thanks, Barnie Alderman, RN, MSN, CDE Diabetes Coordinator Inpatient Diabetes Program [phone] (Team Pager from 8am to 5pm)

## 2021-04-21 NOTE — Progress Notes (Signed)
Iv fentanyl given per [facility] for pain associated with transcutaneous pacing.

## 2021-04-21 NOTE — Consult Note (Signed)
NAME:  [name], MRN:  [id number], DOB:  04-26-1927, LOS: 9 ADMISSION DATE:  04/12/2021, CONSULTATION DATE:  04/21/2021 REFERRING MD:  Dr. Roger Shelter, CHIEF COMPLAINT:  Complete Heart Block    History of Present Illness:  87 year old male presents to ED on 11/30 with shortness of breath and productive cough for last 2-3 days. EMS reports oxygen saturation 60% on room air, placed on NRB with improvement. On arrival to ED CXR with right lower lobe airspace opacity and small right pleural effusion, BNP 1568, continued tachypnea, placed on BiPAP. Throughout stay treated for Sepsis PNA with cefepime/azithromycin for 8 days, required low dose vasopressors however has remained off since 12/2, completed course of solu-cortef. Complicated by symptomatic bradycardia, Cardiology consulted, despite dopamine gtt external pacing required 12/8. Decision made to transfer to [name] for EP consult for consideration for generator change.   Currently denies shortness of breath, palpitations, dizziness  Pertinent  Medical History  Cardiomyopathy, HTN, Systolic HF (EF 21-30), CAD s/p CABG 12/2020, s/p AICD, CKD stage 3 (baseline crt 1-1.3), prostate cancer   Significant Hospital Events: Including procedures, antibiotic start and stop dates in addition to other pertinent events   11/30 > Presents to [facility] ED  12/6 bradycardia heart rate 20s and hypotension requiring atropine 12/8 > external pacing placed for symptomatic bradycardia despite dopamine. 12/9 > transferred to [facility]   Interim History / Subjective:   Denies chest pain, dizziness, palpitations or shortness of breath Wife of 44 years at bedside and corroborates history  Objective   Blood pressure (!) 145/103, pulse 74, temperature 98.2 F (36.8 C), temperature source Oral, resp. rate 16, height 5\' 4"  (1.626 m), weight 76.7 kg, SpO2 95 %.        Intake/Output Summary (Last 24 hours) at 04/21/2021 0933 Last data filed at 04/21/2021 0700 Gross per 24  hour  Intake 763.64 ml  Output 350 ml  Net 413.64 ml   Filed Weights   04/19/21 0500 04/20/21 0423 04/21/21 0413  Weight: 74.7 kg 76.7 kg 76.7 kg    Examination: General: Elderly man lying supine, no distress HENT: No JVD, mild pallor, no icterus Lungs: Clear lungs, no rhonchi, no accessory muscle use Cardiovascular: S1-S2, bradycardia, irregular on dopamine drip Abdomen: Soft, nontender Extremities: 1+ edema, no deformities Neuro: Alert, interactive, nonfocal, hard of hearing GU: Clear urine   Labs show mild hyponatremia, mild hyperkalemia, stable creatinine, slight increase compared to 12/6, mild leukocytosis  Chest x-ray, 7 6 independently reviewed shows right IJ central line, small right pleural effusion  Resolved Hospital Problem list   Acute Hypoxic Respiratory Failure in setting of CAP  Septic Shock   Assessment & Plan:   Complete Heart Block requiring external pacing  -H/O AICD placement  -started on Dopamine gtt with significant HTN > placed on Dobutamine with plan to wean Dopamine -External Pacing started 12/8 Plan -Consult EP , hope was HR would recover off beta blockade but remains brady -continue dopamine gtt, aim for HR 40 & above  New onset Transient A.Fib  Plan -Plan for OP 30 day monitor   CAD s/p CABG  Chronic Systolic Heart Failure -EF 40-45 12/2020  HLD  Plan -Continue ASA -Continue Statin  -Continue to hold Coreg, Losartan, Spironolactone due to borderline hypotension and AKI  Hyperkalemia Acute on CKD stage 3 -Baseline 1-1.3 Plan -Trend BMP -Given Kayexalate this AM  -Avoid Nephrotoxic Agents Hold losartan & aldactone  Bilateral Lower Lobe PNA  -S/P 8 days of Cefepime/Azithromycin  -  dc prednisone , unclear why started by [facility]   Best Practice (right click and "Reselect all SmartList Selections" daily)   Diet/type: Regular consistency (see orders) DVT prophylaxis: prophylactic heparin  GI prophylaxis: N/A Lines: Central line and  yes and it is still needed Foley:  N/A Code Status:  DNR Last date of multidisciplinary goals of care discussion [12/9 by TRH]  Labs   CBC: Recent Labs  Lab 04/17/21 1415 04/18/21 0639 04/18/21 1514 04/20/21 0425 04/21/21 0427  WBC 4.1 7.6 7.8 10.2 11.2*  HGB 13.0 11.9* 13.2 11.9* 13.1  HCT 40.2 37.3* 41.5 38.4* 42.9  MCV 93.3 92.6 93.7 95.8 93.9  PLT 84* 89* 127* 119* 956    Basic Metabolic Panel: Recent Labs  Lab 04/16/21 0543 04/17/21 1415 04/18/21 0639 04/18/21 1514 04/20/21 0425 04/21/21 0427 04/21/21 0847  NA 140   < > 137 132* 136 133* 132*  K 3.9   < > 4.4 5.2* 4.9 5.6* 5.3*  CL 107   < > 106 101 105 103 101  CO2 24   < > 24 21* 25 22 23   GLUCOSE 126*   < > 235* 350* 227* 244* 282*  BUN 68*   < > 65* 64* 78* 77* 79*  CREATININE 2.54*   < > 2.05* 2.14* 2.82* 2.67* 2.79*  CALCIUM 8.1*   < > 8.3* 8.0* 8.0* 8.1* 8.1*  MG 2.3  --   --  2.3 2.2  --   --   PHOS  --   --   --   --  3.0  --   --    < > = values in this interval not displayed.   GFR: Estimated Creatinine Clearance: 15.8 mL/min (A) (by C-G formula based on SCr of 2.79 mg/dL (H)). Recent Labs  Lab 04/18/21 0639 04/18/21 1514 04/20/21 0425 04/21/21 0427  WBC 7.6 7.8 10.2 11.2*    Liver Function Tests: No results for input(s): AST, ALT, ALKPHOS, BILITOT, PROT, ALBUMIN in the last 168 hours. No results for input(s): LIPASE, AMYLASE in the last 168 hours. No results for input(s): AMMONIA in the last 168 hours.  ABG    Component Value Date/Time   PHART 7.347 (L) 04/13/2021 0030   PCO2ART 49.6 (H) 04/13/2021 0030   PO2ART 84.0 04/13/2021 0030   HCO3 25.0 04/13/2021 0030   O2SAT 94.4 04/13/2021 0030     Coagulation Profile: No results for input(s): INR, PROTIME in the last 168 hours.  Cardiac Enzymes: No results for input(s): CKTOTAL, CKMB, CKMBINDEX, TROPONINI in the last 168 hours.  HbA1C: Hgb A1c MFr Bld  Date/Time Value Ref Range Status  11/07/2020 10:35 AM 5.9 (H) 4.8 - 5.6 %  Final    Comment:    (NOTE)         Prediabetes: 5.7 - 6.4         Diabetes: >6.4         Glycemic control for adults with diabetes: <7.0     CBG: Recent Labs  Lab 04/17/21 2112 04/18/21 0721 04/18/21 1104 04/18/21 1456 04/21/21 0819  GLUCAP 391* 218* 243* 280* 274*    Review of Systems:   Shortness of breath Palpitations Pedal edema  Constitutional: negative for anorexia, fevers and sweats  Eyes: negative for irritation, redness and visual disturbance  Ears, nose, mouth, throat, and face: negative for earaches, epistaxis, nasal congestion and sore throat  Respiratory: negative for cough, sputum and wheezing  Cardiovascular: negative for chest pain, orthopnea and syncope  Gastrointestinal:  negative for abdominal pain, constipation, diarrhea, melena, nausea and vomiting  Genitourinary:negative for dysuria, frequency and hematuria  Hematologic/lymphatic: negative for bleeding, easy bruising and lymphadenopathy  Musculoskeletal:negative for arthralgias, muscle weakness and stiff joints  Neurological: negative for coordination problems, gait problems, headaches and weakness  Endocrine: negative for diabetic symptoms including polydipsia, polyuria and weight loss   Past Medical History:  He,  has a past medical history of BPH (benign prostatic hypertrophy), Chronic systolic heart failure ([place]), Coronary artery disease, Essential hypertension, Hyperlipidemia, ICD (implantable cardiac defibrillator) in place, Ischemic cardiomyopathy, Myelodysplasia, low grade ([place]) (11/27/2015), Nephrolithiasis, Prostate cancer ([place]), and Vitamin B 12 deficiency (07/27/2016).   Surgical History:   Past Surgical History:  Procedure Laterality Date   CARDIAC DEFIBRILLATOR PLACEMENT     COLONOSCOPY     COLONOSCOPY N/A 08/19/2013   Procedure: COLONOSCOPY;  Surgeon: Rogene Houston, MD;  [facility];  Service: Endoscopy;  Laterality: N/A;  40   CORONARY ARTERY BYPASS GRAFT     8/06  with left anterior descending artery, saphenous vein graft to circumflex, saphenous vein graft to distal right coronary artery   CYSTOSCOPY/URETEROSCOPY/HOLMIUM LASER/STENT PLACEMENT Right 11/07/2020   Procedure: CYSTOSCOPY/URETEROSCOPY/HOLMIUM LASER/STENT PLACEMENT;  Surgeon: Alexis Frock, MD;  Location: [facility];  Service: Urology;  Laterality: Right;   HERNIA REPAIR     LEFT HEART CATH AND CORS/GRAFTS ANGIOGRAPHY N/A 12/19/2020   Procedure: LEFT HEART CATH AND CORS/GRAFTS ANGIOGRAPHY;  Surgeon: Leonie Man, MD;  Location: [place] CV LAB;  Service: Cardiovascular;  Laterality: N/A;   POLYPECTOMY     PROSTATE SURGERY       Social History:   reports that he quit smoking about 28 years ago. His smoking use included cigarettes. He has a 11.00 pack-year smoking history. He has never used smokeless tobacco. He reports that he does not drink alcohol and does not use drugs.   Family History:  His family history includes Colon cancer in his father; Coronary artery disease in an other family member.   Allergies Allergies  Allergen Reactions   Codeine Nausea And Vomiting     Home Medications  Prior to Admission medications   Medication Sig Start Date End Date Taking? Authorizing Provider  acetaminophen (TYLENOL) 500 MG tablet Take 1 tablet (500 mg total) by mouth every 6 (six) hours as needed for mild pain or fever. 07/14/15  Yes Carmin Muskrat, MD  aspirin 81 MG chewable tablet Chew 81 mg by mouth daily.   Yes [provider]  carvedilol (COREG) 12.5 MG tablet Take 1 tablet (12.5 mg total) by mouth 2 (two) times daily with a meal. 03/30/21  Yes Strader, [place] M, PA-C  Cholecalciferol (VITAMIN D3) 1000 UNITS CAPS Take 1 capsule by mouth daily.   Yes [provider]  cyanocobalamin (,VITAMIN B-12,) 1000 MCG/ML injection ADMINISTER 1 ML IN THE MUSCLE 1 TIME AS DIRECTED Patient taking differently: Inject 1,000 mcg into the muscle every 30 (thirty) days. 03/31/21  Yes  Pennington, Rebekah M, PA-C  diphenhydrAMINE (BENADRYL) 25 MG tablet Take 25 mg by mouth at bedtime as needed for sleep.   Yes [provider]  losartan (COZAAR) 50 MG tablet Take 1 tablet (50 mg total) by mouth daily. 01/03/21 07/02/21 Yes Strader, Fransisco Hertz, PA-C  nitroGLYCERIN (NITROSTAT) 0.4 MG SL tablet Place 1 tablet (0.4 mg total) under the tongue every 5 (five) minutes as needed. 06/08/19  Yes Imogene Burn, PA-C  simvastatin (ZOCOR) 40 MG tablet TAKE 1 TABLET BY MOUTH EVERY  DAY IN THE EVENING Patient taking differently: Take 40 mg by mouth daily. 03/09/21  Yes Satira Sark, MD  spironolactone (ALDACTONE) 25 MG tablet Take 0.5 tablets (12.5 mg total) by mouth daily. 03/30/21 06/28/21 Yes Strader, Fransisco Hertz, PA-C  vitamin C (ASCORBIC ACID) 500 MG tablet Take 500 mg by mouth daily.     Yes [provider]     Kara Mead MD. FCCP. [facility] Pager : [phone]  If no response to pager , please call [phone] until 7 pm After 7:00 pm call Elink  [phone]   04/21/2021

## 2021-04-21 NOTE — Progress Notes (Addendum)
Progress Note  Patient Name: [name] Date of Encounter: 04/21/2021  [facility] Cardiologist: Rozann Lesches, MD   Subjective   Some increased pacing requirements over night  Inpatient Medications    Scheduled Meds:  aspirin EC  81 mg Oral Daily   Chlorhexidine Gluconate Cloth  6 each Topical Daily   feeding supplement  237 mL Oral BID BM   hydrALAZINE  25 mg Oral Q8H   insulin aspart  0-6 Units Subcutaneous TID WC   mouth rinse  15 mL Mouth Rinse BID   polyethylene glycol  17 g Oral Daily   predniSONE  20 mg Oral Q breakfast   simvastatin  40 mg Oral q1800   Continuous Infusions:  sodium chloride 250 mL (04/13/21 0915)   DOBUTamine Stopped (04/20/21 1102)   DOPamine 15 mcg/kg/min (04/21/21 0700)   PRN Meds: acetaminophen **OR** acetaminophen, fentaNYL (SUBLIMAZE) injection, guaiFENesin-dextromethorphan, ipratropium-albuterol, loperamide, ondansetron (ZOFRAN) IV, prochlorperazine   Vital Signs    Vitals:   04/21/21 0530 04/21/21 0600 04/21/21 0630 04/21/21 0700  BP: (!) 165/44 (!) 179/67 (!) 166/62 (!) 145/103  Pulse: (!) 38 (!) 51 75 74  Resp: 11 20 16 16   Temp:    98.2 F (36.8 C)  TempSrc:    Oral  SpO2: 95% 96% 98% 95%  Weight:      Height:        Intake/Output Summary (Last 24 hours) at 04/21/2021 0807 Last data filed at 04/21/2021 0700 Gross per 24 hour  Intake 1008.47 ml  Output 350 ml  Net 658.47 ml   Last 3 Weights 04/21/2021 04/20/2021 04/19/2021  Weight (lbs) 169 lb 1.5 oz 169 lb 1.5 oz 164 lb 10.9 oz  Weight (kg) 76.7 kg 76.7 kg 74.7 kg      Telemetry    Complete heart block, intermittent external pacing - Personally Reviewed  ECG    N/a - Personally Reviewed  Physical Exam   GEN: No acute distress.   Neck: No JVD Cardiac: brady Respiratory: Clear to auscultation bilaterally. GI: Soft, nontender, non-distended  MS: No edema; No deformity. Neuro:  Nonfocal  Psych: Normal affect   Labs    High Sensitivity Troponin:    Recent Labs  Lab 04/12/21 2143 04/12/21 2316 04/13/21 0142 04/13/21 0418  TROPONINIHS 162* 184* 189* 221*     Chemistry Recent Labs  Lab 04/16/21 0543 04/17/21 1415 04/18/21 1514 04/20/21 0425 04/21/21 0427  NA 140   < > 132* 136 133*  K 3.9   < > 5.2* 4.9 5.6*  CL 107   < > 101 105 103  CO2 24   < > 21* 25 22  GLUCOSE 126*   < > 350* 227* 244*  BUN 68*   < > 64* 78* 77*  CREATININE 2.54*   < > 2.14* 2.82* 2.67*  CALCIUM 8.1*   < > 8.0* 8.0* 8.1*  MG 2.3  --  2.3 2.2  --   GFRNONAA 23*   < > 28* 20* 22*  ANIONGAP 9   < > 10 6 8    < > = values in this interval not displayed.    Lipids No results for input(s): CHOL, TRIG, HDL, LABVLDL, LDLCALC, CHOLHDL in the last 168 hours.  Hematology Recent Labs  Lab 04/18/21 1514 04/20/21 0425 04/21/21 0427  WBC 7.8 10.2 11.2*  RBC 4.43 4.01* 4.57  HGB 13.2 11.9* 13.1  HCT 41.5 38.4* 42.9  MCV 93.7 95.8 93.9  MCH 29.8 29.7 28.7  MCHC 31.8 31.0 30.5  RDW 14.0 13.9 13.9  PLT 127* 119* 210   Thyroid No results for input(s): TSH, FREET4 in the last 168 hours.  BNP Recent Labs  Lab 04/18/21 0639  BNP 2,074.0*    DDimer No results for input(s): DDIMER in the last 168 hours.   Radiology    No results found.  Cardiac Studies     Patient Profile     87 y.o. male  CAD (s/p CABG in 2006 with LIMA-LAD, SVG-LCx and SVG-dRCA, repeat cath in 12/2020 showing 3/3 patent grafts and likely culprit was 80% LPAV and medical management recommended), HFrEF/ICM (EF at 45-50% in 2016, at 40-45% by repeat echo in 12/2020), [employer] ICD at [facility] (previously followed by Dr. Lovena Le and at [facility] in 2016 and not replaced due to improvement in his EF), history of prostate cancer, nephrolithiasis, MDS, HTN and HLD who presented with septic shock secondary to pneumonia. Cardiology was consulted for Afib and CHF.   Assessment & Plan    1.Pneumonia/Sepsis - per primary team. Transiently required pressors.    2. Afib - transient episode in  setting of pneumonia and sepsis - no prior history of afib, given transient episode in setting of acute systetmic illness has not been committed to anticoag. - will need outpatient 30 day monitor   3. Chronic systolic HF - actually presented hypovolemic - home meds held in setting of hypotension and sepsis - bps improved today. Significant wheezing, change his home coreg to toprol starting at 50mg  daily but significant bradycardia and now stopped.  Continue to hold losartan and aldactone given AKI.     4. CAD with prior CABG -  S/p CABG in 2006 with LIMA-LAD, SVG-LCx and SVG-dRCA, repeat cath in 12/2020 showing 3/3 patent grafts and likely culprit was 80% LPAV and medical management recommended. No anginal symptoms - continue ASA, statin.    5. AKI - per primary team   6. Sinus pauses/Asystole/Comeplete heart blck - episodes of sinus pauses, self terminating asystole, and complete heart block starting 04/18/21 after receiving toprol 50mg  x 1. Patient had been on coreg 12.5mg  bid at home, surprising response to toprol dose.  - started on dopamine drip, significant HTN SBPs 180s and 190s, dobutamine was started to wean dopamine. Intermittently required external pacing. - he is on dopamine 15, weaned off dobutamine.  - old AICD gen not replaced in the past due to EF improving.   - he is now off antibiotics for his pneumonia. We had hoped heart rates would normalize off beta blocker and we could avoid a new device in this elderly patient with recent pneumonia but they have not. He is the clearest mentally I have seen him today. Despite his age he lives independently and is relatively function as baseline.Will need to be transferred to [facility] to be evaluated by EP - I discussed with patient who is cognitive this AM as well as his son who agree for transfer and pacemaker evaluation.   For questions or updates, please contact [place] Please consult [email or web address] for contact info under         Signed, Carlyle Dolly, MD  04/21/2021, 8:07 AM

## 2021-04-22 DIAGNOSIS — I442 Atrioventricular block, complete: Secondary | ICD-10-CM | POA: Diagnosis not present

## 2021-04-22 DIAGNOSIS — Z66 Do not resuscitate: Secondary | ICD-10-CM | POA: Diagnosis not present

## 2021-04-22 DIAGNOSIS — I5041 Acute combined systolic (congestive) and diastolic (congestive) heart failure: Secondary | ICD-10-CM | POA: Diagnosis not present

## 2021-04-22 DIAGNOSIS — N179 Acute kidney failure, unspecified: Secondary | ICD-10-CM | POA: Diagnosis not present

## 2021-04-22 LAB — CBC
HCT: 32.8 % — ABNORMAL LOW (ref 39.0–52.0)
Hemoglobin: 10.7 g/dL — ABNORMAL LOW (ref 13.0–17.0)
MCH: 29.5 pg (ref 26.0–34.0)
MCHC: 32.6 g/dL (ref 30.0–36.0)
MCV: 90.4 fL (ref 80.0–100.0)
Platelets: 151 10*3/uL (ref 150–400)
RBC: 3.63 MIL/uL — ABNORMAL LOW (ref 4.22–5.81)
RDW: 13.9 % (ref 11.5–15.5)
WBC: 7.6 10*3/uL (ref 4.0–10.5)
nRBC: 0 % (ref 0.0–0.2)

## 2021-04-22 LAB — PHOSPHORUS: Phosphorus: 3 mg/dL (ref 2.5–4.6)

## 2021-04-22 LAB — GLUCOSE, CAPILLARY
Glucose-Capillary: 201 mg/dL — ABNORMAL HIGH (ref 70–99)
Glucose-Capillary: 164 mg/dL — ABNORMAL HIGH (ref 70–99)
Glucose-Capillary: 92 mg/dL (ref 70–99)
Glucose-Capillary: 113 mg/dL — ABNORMAL HIGH (ref 70–99)

## 2021-04-22 LAB — BASIC METABOLIC PANEL
Anion gap: 8 (ref 5–15)
BUN: 74 mg/dL — ABNORMAL HIGH (ref 8–23)
CO2: 23 mmol/L (ref 22–32)
Calcium: 7.8 mg/dL — ABNORMAL LOW (ref 8.9–10.3)
Chloride: 102 mmol/L (ref 98–111)
Creatinine, Ser: 2.77 mg/dL — ABNORMAL HIGH (ref 0.61–1.24)
GFR, Estimated: 21 mL/min — ABNORMAL LOW (ref >60)
Glucose, Bld: 283 mg/dL — ABNORMAL HIGH (ref 70–99)
Potassium: 4.7 mmol/L (ref 3.5–5.1)
Sodium: 133 mmol/L — ABNORMAL LOW (ref 135–145)

## 2021-04-22 LAB — MAGNESIUM: Magnesium: 2.1 mg/dL (ref 1.7–2.4)

## 2021-04-22 LAB — CORTISOL: Cortisol, Plasma: 9.8 ug/dL

## 2021-04-22 LAB — TSH: TSH: 3.006 u[IU]/mL (ref 0.350–4.500)

## 2021-04-22 MED ORDER — HEPARIN (PORCINE) IN NACL 1000-0.9 UT/500ML-% IV SOLN
INTRAVENOUS | Status: DC | PRN
  Administered 2021-04-22: 500 mL

## 2021-04-22 MED ORDER — INSULIN ASPART 100 UNIT/ML IJ SOLN
0.0000 [IU] | Freq: Three times a day (TID) | INTRAMUSCULAR | Status: DC
  Administered 2021-04-22 – 2021-04-25 (×2): 3 [IU] via SUBCUTANEOUS

## 2021-04-22 MED ORDER — DOCUSATE SODIUM 100 MG PO CAPS
200.0000 mg | ORAL_CAPSULE | Freq: Every day | ORAL | Status: DC
  Administered 2021-04-22 – 2021-04-25 (×2): 200 mg via ORAL
  Filled 2021-04-22 (×2): qty 2

## 2021-04-22 MED ORDER — LIDOCAINE HCL (PF) 1 % IJ SOLN
INTRAMUSCULAR | Status: DC | PRN
  Administered 2021-04-22: 5 mL

## 2021-04-22 MED ORDER — BISACODYL 10 MG RE SUPP
10.0000 mg | Freq: Every day | RECTAL | Status: DC | PRN
  Administered 2021-04-22: 11:00:00 10 mg via RECTAL
  Filled 2021-04-22: qty 1

## 2021-04-22 MED ORDER — SODIUM CHLORIDE 0.9% FLUSH
3.0000 mL | Freq: Two times a day (BID) | INTRAVENOUS | Status: DC
  Administered 2021-04-22 – 2021-04-25 (×6): 3 mL via INTRAVENOUS

## 2021-04-22 NOTE — Progress Notes (Signed)
0700 - Report received from PM RN.  All questions answered.  Safety checks performed.  All lines verified. Hand hygiene performed before/after each pt contact. 0730 - Dr. Quentin Ore rounded on pt.  PM settings changed to VVI 70; 5 mA; 1.71mV sensitivity. 0930 - Dr. Shearon Stalls rounded on pt. 1100 - Pt's family arrived to visit.  Pt requesting suppository for constipation.  Suppository administered, per order. 1530 - Pt's TVP stopped capturing.  Cardiology paged.  No change in pt's LOC, respiratory status, or BP.  Pt w/lg BM.  Pt cleaned. 50 - Dr. Percival Spanish rounded.  PM settings changed to VVI 80; 10 mA; 56mV sensitivity.  Pt's family curious about waiting until Monday for PPM.  Dr. Percival Spanish answered all their questions.   1900 - Report given to PM RN.  All questions answered.

## 2021-04-22 NOTE — Progress Notes (Signed)
   04/22/21 0250  Mobility  Transport method Bed (Returned from Cath lab with TPM measured 55 cm.)

## 2021-04-22 NOTE — Progress Notes (Signed)
NAME:  [name], MRN:  [id number], DOB:  [birth year], LOS: 64 ADMISSION DATE:  04/12/2021, CONSULTATION DATE:  04/21/2021 REFERRING MD:  Dr. Roger Shelter, CHIEF COMPLAINT:  Complete Heart Block    History of Present Illness:  87 year old male presents to ED on 11/30 with shortness of breath and productive cough for last 2-3 days. EMS reports oxygen saturation 60% on room air, placed on NRB with improvement. On arrival to ED CXR with right lower lobe airspace opacity and small right pleural effusion, BNP 1568, continued tachypnea, placed on BiPAP. Throughout stay treated for Sepsis PNA with cefepime/azithromycin for 8 days, required low dose vasopressors however has remained off since 12/2, completed course of solu-cortef. Complicated by symptomatic bradycardia, Cardiology consulted, despite dopamine gtt external pacing required 12/8. Decision made to transfer to [name] for EP consult for consideration for generator change.   PCCM consulted for management at [facility]. Now transferred to [facility]  Pertinent  Medical History  Cardiomyopathy, HTN, Systolic HF (EF 12-45), CAD s/p CABG 12/2020, s/p AICD, CKD stage 3 (baseline crt 1-1.3), prostate cancer   Significant Hospital Events: Including procedures, antibiotic start and stop dates in addition to other pertinent events   11/30 > Presents to [facility]  12/6 bradycardia heart rate 20s and hypotension requiring atropine 12/8 > external pacing placed for symptomatic bradycardia despite dopamine. 12/9 > transferred to [facility]  12/10 early morning TVP placed in cath lab  Interim History / Subjective:  TVP capturing appropriately, off chrontropic support  Objective   Blood pressure (!) 97/51, pulse 69, temperature 97.6 F (36.4 C), temperature source Oral, resp. rate 16, height 5\' 4"  (1.626 m), weight 79.9 kg, SpO2 97 %.        Intake/Output Summary (Last 24 hours) at 04/22/2021 0940 Last data filed at 04/22/2021 0600 Gross per 24 hour  Intake 901.49 ml   Output 950 ml  Net -48.51 ml   Filed Weights   04/20/21 0423 04/21/21 0413 04/22/21 0313  Weight: 76.7 kg 76.7 kg 79.9 kg    Examination: Gen:      Resting comfortably, no distress HEENT:  dry mucus membranes Lungs:    ctab no wheezes or crackles  CV:         RRR, paced Abd:      + bowel sounds; soft, non-tender; no palpable masses, no distension Ext:    No edema Skin:      Warm and dry; no rashes Neuro:   Aox4, no focal deficits   Resolved Hospital Problem list   Acute Hypoxic Respiratory Failure in setting of CAP  Septic Shock  Bilateral Lower Lobe PNA-resolved  Hyperkalemia  Assessment & Plan:  Complete Heart Block requiring Transvenous pacemaker wires -H/O AICD placement, issues reported with generator, not replaced in past due to improved EF  - continue TVP, now off chronotropic support - plan for OR Monday for generator replacement and lead assessment with EP.  - discussed with Dr. Quentin Ore.   Hypertension -PO hydralazine   New onset Transient A.Fib  Plan -Plan for OP 30 day monitor   CAD s/p CABG  Chronic Systolic Heart Failure -EF 40-45 12/2020  HLD  Plan -Continue ASA -Continue Statin  -Continue to hold Coreg, Losartan, Spironolactone due to borderline hypotension and AKI  Acute on CKD stage 3 -Baseline 1-1.3 -Trend BMP -Avoid nephrotoxic agents -Continue holding losartan & aldactone  Type 2 DM with hyperglycemia - increase SSI due to hyperglycemia, likely worsened by recent prednisone - A1C  7.2%  Constipation - advance bowel regimen with colace and suppository. Enema if no bm Best Practice (right click and "Reselect all SmartList Selections" daily)   Diet/type: Regular consistency (see orders) DVT prophylaxis: prophylactic heparin  GI prophylaxis: N/A Lines: Central line and yes and it is still needed Foley:  N/A Code Status:  DNR Last date of multidisciplinary goals of care discussion [12/9 continue DNR]  Labs   CBC: Recent Labs  Lab  04/18/21 0639 04/18/21 1514 04/20/21 0425 04/21/21 0427 04/22/21 0531  WBC 7.6 7.8 10.2 11.2* 7.6  HGB 11.9* 13.2 11.9* 13.1 10.7*  HCT 37.3* 41.5 38.4* 42.9 32.8*  MCV 92.6 93.7 95.8 93.9 90.4  PLT 89* 127* 119* 210 626    Basic Metabolic Panel: Recent Labs  Lab 04/16/21 0543 04/17/21 1415 04/18/21 1514 04/20/21 0425 04/21/21 0427 04/21/21 0847 04/21/21 1312 04/22/21 0326  NA 140   < > 132* 136 133* 132*  --  133*  K 3.9   < > 5.2* 4.9 5.6* 5.3* 5.0 4.7  CL 107   < > 101 105 103 101  --  102  CO2 24   < > 21* 25 22 23   --  23  GLUCOSE 126*   < > 350* 227* 244* 282*  --  283*  BUN 68*   < > 64* 78* 77* 79*  --  74*  CREATININE 2.54*   < > 2.14* 2.82* 2.67* 2.79*  --  2.77*  CALCIUM 8.1*   < > 8.0* 8.0* 8.1* 8.1*  --  7.8*  MG 2.3  --  2.3 2.2  --   --   --  2.1  PHOS  --   --   --  3.0  --   --   --  3.0   < > = values in this interval not displayed.   GFR: Estimated Creatinine Clearance: 16.2 mL/min (A) (by C-G formula based on SCr of 2.77 mg/dL (H)). Recent Labs  Lab 04/18/21 1514 04/20/21 0425 04/21/21 0427 04/22/21 0531  WBC 7.8 10.2 11.2* 7.6    Liver Function Tests: No results for input(s): AST, ALT, ALKPHOS, BILITOT, PROT, ALBUMIN in the last 168 hours. No results for input(s): LIPASE, AMYLASE in the last 168 hours. No results for input(s): AMMONIA in the last 168 hours.  ABG    Component Value Date/Time   PHART 7.347 (L) 04/13/2021 0030   PCO2ART 49.6 (H) 04/13/2021 0030   PO2ART 84.0 04/13/2021 0030   HCO3 25.0 04/13/2021 0030   O2SAT 94.4 04/13/2021 0030     Coagulation Profile: No results for input(s): INR, PROTIME in the last 168 hours.  Cardiac Enzymes: No results for input(s): CKTOTAL, CKMB, CKMBINDEX, TROPONINI in the last 168 hours.  HbA1C: Hgb A1c MFr Bld  Date/Time Value Ref Range Status  04/21/2021 04:27 AM 7.2 (H) 4.8 - 5.6 % Final    Comment:    (NOTE) Pre diabetes:          5.7%-6.4%  Diabetes:               >6.4%  Glycemic control for   <7.0% adults with diabetes   11/07/2020 10:35 AM 5.9 (H) 4.8 - 5.6 % Final    Comment:    (NOTE)         Prediabetes: 5.7 - 6.4         Diabetes: >6.4         Glycemic control for adults with diabetes: <7.0  The patient is critically ill due to complete heart block.  Critical care was necessary to treat or prevent imminent or life-threatening deterioration.  Critical care was time spent personally by me on the following activities: development of treatment plan with patient and/or surrogate as well as nursing, discussions with consultants, evaluation of patient's response to treatment, examination of patient, obtaining history from patient or surrogate, ordering and performing treatments and interventions, ordering and review of laboratory studies, ordering and review of radiographic studies, pulse oximetry, re-evaluation of patient's condition and participation in multidisciplinary rounds.   Critical Care Time devoted to patient care services described in this note is 37 minutes. This time reflects time of care of this [place] . This critical care time does not reflect separately billable procedures or procedure time, teaching time or supervisory time of PA/NP/Med student/Med Resident etc but could involve care discussion time.       [facility] 04/22/2021 9:41 AM  Pager: see AMION  If no response to pager , please call critical care on call (see AMION) until 7pm After 7:00 pm call Elink

## 2021-04-22 NOTE — Interval H&P Note (Signed)
History and Physical Interval Note:  04/22/2021 0:63 AM  [name]  has presented today for surgery, with the diagnosis of STEMI.  The various methods of treatment have been discussed with the patient and family. After consideration of risks, benefits and other options for treatment, the patient has consented to  Procedure(s): TEMPORARY PACEMAKER (N/A) as a surgical intervention.  The patient's history has been reviewed, patient examined, no change in status, stable for surgery.  I have reviewed the patient's chart and labs.  Questions were answered to the patient's satisfaction.     [name] [facility] 04/22/2021 2:01 AM

## 2021-04-22 NOTE — Plan of Care (Signed)
  Problem: Education: Goal: Knowledge of General Education information will improve Description: Including pain rating scale, medication(s)/side effects and non-pharmacologic comfort measures Outcome: Progressing   Problem: Clinical Measurements: Goal: Will remain free from infection Outcome: Progressing Goal: Diagnostic test results will improve Outcome: Progressing Goal: Respiratory complications will improve Outcome: Progressing Goal: Cardiovascular complication will be avoided Outcome: Progressing   Problem: Activity: Goal: Risk for activity intolerance will decrease Outcome: Progressing

## 2021-04-22 NOTE — Plan of Care (Signed)
[name] [facility] transferred to [facility] in intermittent CHB requiring transcutaneous pacing when in CHB. Have placed a temporary temp-perm device with current HR 80 bpm to keep the patient stabel over the weekend.  Will plan for generator change Monday 12/12

## 2021-04-22 NOTE — Consult Note (Signed)
Electrophysiology Consultation:   Patient ID: [name] MRN: [id number]; DOB: 1927-04-12  Admit date: 04/12/2021 Date of Consult: 04/22/2021  PCP:  [name], MD   [facility] Providers Cardiologist:  Rozann Lesches, MD    Patient Profile:   [name] is a 87 y.o. male with a hx of chronic systolic HF, CAD s/p prior CABG, prior ICD now nonfunctional because of depleted battery who is being seen 04/22/2021 for the evaluation of complete heart block, sinus arrest at the request of Dr Shearon Stalls.  History of Present Illness:   [name] was transferred to [facility] from [facility] yesterday. He was being managed there with dopamine gtt and intermittent external pacing. He was initially admitted with septic shock secondary to pneumonia. He has improved and is now off antibiotics. Metoprolol was held but his conduction did not improve so he was transferred to [facility] for consideration of a generator replacement. Overnight, he received a TVP by Dr Martinique.   Past Medical History:  Diagnosis Date   BPH (benign prostatic hypertrophy)    Chronic systolic heart failure (HCC)    Coronary artery disease    a. Multivessel status post CABG 8/06, LIMA-LAD, SVG-CFX, SVG-distal RCA b. 12/2020: cath showing 3/3 patent grafts with 80% stenosis along LPAV prior to bifurication and medical management recommended.   Essential hypertension    Hyperlipidemia    ICD (implantable cardiac defibrillator) in place    [employer]   Ischemic cardiomyopathy    Myelodysplasia, low grade ([place]) 11/27/2015   Nephrolithiasis    Prostate cancer ([place])    Vitamin B 12 deficiency 07/27/2016    Past Surgical History:  Procedure Laterality Date   CARDIAC DEFIBRILLATOR PLACEMENT     COLONOSCOPY     COLONOSCOPY N/A 08/19/2013   Procedure: COLONOSCOPY;  Surgeon: Rogene Houston, MD;  Location: [facility] ENDO SUITE;  Service: Endoscopy;  Laterality: N/A;  23   CORONARY ARTERY BYPASS GRAFT     8/06 with left anterior descending  artery, saphenous vein graft to circumflex, saphenous vein graft to distal right coronary artery   CYSTOSCOPY/URETEROSCOPY/HOLMIUM LASER/STENT PLACEMENT Right 11/07/2020   Procedure: CYSTOSCOPY/URETEROSCOPY/HOLMIUM LASER/STENT PLACEMENT;  Surgeon: Alexis Frock, MD;  Location: [facility] ORS;  Service: Urology;  Laterality: Right;   HERNIA REPAIR     LEFT HEART CATH AND CORS/GRAFTS ANGIOGRAPHY N/A 12/19/2020   Procedure: LEFT HEART CATH AND CORS/GRAFTS ANGIOGRAPHY;  Surgeon: Leonie Man, MD;  Location: [place] CV LAB;  Service: Cardiovascular;  Laterality: N/A;   POLYPECTOMY     PROSTATE SURGERY       Home Medications:  Prior to Admission medications   Medication Sig Start Date End Date Taking? Authorizing Provider  acetaminophen (TYLENOL) 500 MG tablet Take 1 tablet (500 mg total) by mouth every 6 (six) hours as needed for mild pain or fever. 07/14/15  Yes Carmin Muskrat, MD  aspirin 81 MG chewable tablet Chew 81 mg by mouth daily.   Yes [provider]  carvedilol (COREG) 12.5 MG tablet Take 1 tablet (12.5 mg total) by mouth 2 (two) times daily with a meal. 03/30/21  Yes Strader, [place] M, PA-C  Cholecalciferol (VITAMIN D3) 1000 UNITS CAPS Take 1 capsule by mouth daily.   Yes [provider]  cyanocobalamin (,VITAMIN B-12,) 1000 MCG/ML injection ADMINISTER 1 ML IN THE MUSCLE 1 TIME AS DIRECTED Patient taking differently: Inject 1,000 mcg into the muscle every 30 (thirty) days. 03/31/21  Yes Pennington, Rebekah M, PA-C  diphenhydrAMINE (BENADRYL) 25 MG tablet  Take 25 mg by mouth at bedtime as needed for sleep.   Yes [provider]  losartan (COZAAR) 50 MG tablet Take 1 tablet (50 mg total) by mouth daily. 01/03/21 07/02/21 Yes Strader, Fransisco Hertz, PA-C  nitroGLYCERIN (NITROSTAT) 0.4 MG SL tablet Place 1 tablet (0.4 mg total) under the tongue every 5 (five) minutes as needed. 06/08/19  Yes Imogene Burn, PA-C  simvastatin (ZOCOR) 40 MG tablet TAKE 1 TABLET BY MOUTH  EVERY DAY IN THE EVENING Patient taking differently: Take 40 mg by mouth daily. 03/09/21  Yes Satira Sark, MD  spironolactone (ALDACTONE) 25 MG tablet Take 0.5 tablets (12.5 mg total) by mouth daily. 03/30/21 06/28/21 Yes Strader, Fransisco Hertz, PA-C  vitamin C (ASCORBIC ACID) 500 MG tablet Take 500 mg by mouth daily.     Yes [provider]    Inpatient Medications: Scheduled Meds:  aspirin EC  81 mg Oral Daily   Chlorhexidine Gluconate Cloth  6 each Topical Daily   feeding supplement  237 mL Oral BID BM   hydrALAZINE  25 mg Oral Q8H   insulin aspart  0-6 Units Subcutaneous TID WC   mouth rinse  15 mL Mouth Rinse BID   polyethylene glycol  17 g Oral Daily   simvastatin  40 mg Oral q1800   Continuous Infusions:  sodium chloride 250 mL (04/13/21 0915)   PRN Meds: acetaminophen **OR** acetaminophen, fentaNYL (SUBLIMAZE) injection, guaiFENesin-dextromethorphan, ipratropium-albuterol, ondansetron (ZOFRAN) IV  Allergies:    Allergies  Allergen Reactions   Codeine Nausea And Vomiting    Social History:   Social History   Socioeconomic History   Marital status: Married    Spouse name: Not on file   Number of children: Not on file   Years of education: Not on file   Highest education level: Not on file  Occupational History   Occupation: Retired    Fish farm manager: RETIRED  Tobacco Use   Smoking status: Former    Packs/day: 0.25    Years: 44.00    Pack years: 11.00    Types: Cigarettes    Quit date: 05/14/1992    Years since quitting: 28.9   Smokeless tobacco: Never  Vaping Use   Vaping Use: Never used  Substance and Sexual Activity   Alcohol use: No   Drug use: No   Sexual activity: Not on file    Comment: married 71 years  Other Topics Concern   Not on file  Social History Narrative   Not on file   Social Determinants of Health   Financial Resource Strain: Not on file  Food Insecurity: Not on file  Transportation Needs: Not on file  Physical Activity:  Not on file  Stress: Not on file  Social Connections: Not on file  Intimate Partner Violence: Not on file    Family History:    Family History  Problem Relation Age of Onset   Colon cancer Father    Coronary artery disease Other      ROS:  Please see the history of present illness.   All other ROS reviewed and negative.     Physical Exam/Data:   Vitals:   04/22/21 0502 04/22/21 0504 04/22/21 0600 04/22/21 0643  BP: (!) 82/47 (!) 92/57 (!) 100/56 (!) 100/56  Pulse: 80 80 80   Resp: 15 19 16    Temp:      TempSrc:      SpO2: 97% 98% 93%   Weight:      Height:  Intake/Output Summary (Last 24 hours) at 04/22/2021 0727 Last data filed at 04/22/2021 0600 Gross per 24 hour  Intake 1101.49 ml  Output 950 ml  Net 151.49 ml   Last 3 Weights 04/22/2021 04/21/2021 04/20/2021  Weight (lbs) 176 lb 2.4 oz 169 lb 1.5 oz 169 lb 1.5 oz  Weight (kg) 79.9 kg 76.7 kg 76.7 kg     Body mass index is 30.24 kg/m.  General:  Elderly, in NAD in bed at 30 degrees. HEENT: normal. TVP in neck. Neck: no JVD Vascular: No carotid bruits; Distal pulses 2+ bilaterally Cardiac:  normal S1, S2; RRR; no murmur  Lungs:  clear to auscultation bilaterally, no wheezing, rhonchi or rales  Abd: soft, nontender, no hepatomegaly  Ext: no edema Musculoskeletal:  No deformities, BUE and BLE strength normal and equal Skin: warm and dry  Neuro:  CNs 2-12 intact, no focal abnormalities noted Psych:  Normal affect   EKG:  The EKG was personally reviewed and demonstrates:  sinus rhythm, complete heart block with a RBBB escape rhythm  Telemetry:  Telemetry was personally reviewed and demonstrates:  RV pacing now. CHB underlying.  Relevant CV Studies:  12/17/2020 Echo - LVEF40-45%.   Laboratory Data:  High Sensitivity Troponin:   Recent Labs  Lab 04/12/21 2143 04/12/21 2316 04/13/21 0142 04/13/21 0418  TROPONINIHS 162* 184* 189* 221*     Chemistry Recent Labs  Lab 04/18/21 1514  04/20/21 0425 04/21/21 0427 04/21/21 0847 04/21/21 1312 04/22/21 0326  NA 132* 136 133* 132*  --  133*  K 5.2* 4.9 5.6* 5.3* 5.0 4.7  CL 101 105 103 101  --  102  CO2 21* 25 22 23   --  23  GLUCOSE 350* 227* 244* 282*  --  283*  BUN 64* 78* 77* 79*  --  74*  CREATININE 2.14* 2.82* 2.67* 2.79*  --  2.77*  CALCIUM 8.0* 8.0* 8.1* 8.1*  --  7.8*  MG 2.3 2.2  --   --   --  2.1  GFRNONAA 28* 20* 22* 21*  --  21*  ANIONGAP 10 6 8 8   --  8    No results for input(s): PROT, ALBUMIN, AST, ALT, ALKPHOS, BILITOT in the last 168 hours. Lipids No results for input(s): CHOL, TRIG, HDL, LABVLDL, LDLCALC, CHOLHDL in the last 168 hours.  Hematology Recent Labs  Lab 04/20/21 0425 04/21/21 0427 04/22/21 0531  WBC 10.2 11.2* 7.6  RBC 4.01* 4.57 3.63*  HGB 11.9* 13.1 10.7*  HCT 38.4* 42.9 32.8*  MCV 95.8 93.9 90.4  MCH 29.7 28.7 29.5  MCHC 31.0 30.5 32.6  RDW 13.9 13.9 13.9  PLT 119* 210 151   Thyroid  Recent Labs  Lab 04/22/21 0326  TSH 3.006    BNP Recent Labs  Lab 04/18/21 0639  BNP 2,074.0*    DDimer No results for input(s): DDIMER in the last 168 hours.   Radiology/Studies:  CARDIAC CATHETERIZATION  Result Date: 04/22/2021 Successful placement of Tempo temporary transvenous pacing wire.   DG Chest Port 1 View  Result Date: 04/18/2021 CLINICAL DATA:  Increased shortness of breath EXAM: PORTABLE CHEST 1 VIEW COMPARISON:  04/13/2021 FINDINGS: Unchanged mild-to-moderate cardiomegaly, with mild central vascular fullness. No definite edema. Status post median sternotomy and CABG. Left chest cardiac device, with unchanged position of the generator or wires. Slightly increased opacities in the retrocardiac left lower lobe and right lung base. Unchanged small right pleural effusion. No left pleural effusion. Remote right rib fracture.  No acute  osseous abnormality. IMPRESSION: Slightly increased opacities in the lung bases, which are nonspecific but concerning for infection. Stable  right pleural effusion and cardiomegaly. Electronically Signed   By: Merilyn Baba M.D.   On: 04/18/2021 13:31   DG Chest Port 1V same Day  Result Date: 04/18/2021 CLINICAL DATA:  Post central line placement EXAM: PORTABLE CHEST 1 VIEW COMPARISON:  Film from earlier in the same day. FINDINGS: Cardiac shadow is enlarged. Defibrillator is again noted. Right jugular central line is noted with the tip at the cavoatrial junction. No pneumothorax is noted. Stable opacities in the bases are not seen right greater than left with small right-sided pleural effusion. IMPRESSION: No pneumothorax following central line placement. No significant interval change from the prior exam is noted. Electronically Signed   By: Inez Catalina M.D.   On: 04/18/2021 19:00   [place] EKG SITE RITE  Result Date: 04/18/2021 If Site Rite image not attached, placement could not be confirmed due to current cardiac rhythm.  [place] EKG SITE RITE  Result Date: 04/18/2021 If Site Rite image not attached, placement could not be confirmed due to current cardiac rhythm.    Assessment and Plan:   Complete heart block Poor underlying escape. Now with TVP. Will require generator replacement with assessment of his existing RA/RV lead. If possible, plan for addition of CS lead vs. LBBB area lead given low EF and anticipated high pacing burden. Keep NPO Sunday night for tentative OR date Monday.  Risks, benefits, alternatives to PPM implantation were discussed in detail with the patient today. The patient understands that the risks include but are not limited to bleeding, infection, pneumothorax, perforation, tamponade, vascular damage, renal failure, MI, stroke, death, and lead dislodgement and wishes to proceed.  This morning, TVP threshold 1.60mA. No underlying at VVI 30.   For questions or updates, please contact [place] Please consult [email or web address] for contact info under    Signed, Vickie Epley, MD  04/22/2021 7:27 AM

## 2021-04-22 NOTE — Plan of Care (Signed)

## 2021-04-23 ENCOUNTER — Inpatient Hospital Stay (HOSPITAL_COMMUNITY): Payer: Medicare Other

## 2021-04-23 DIAGNOSIS — Z95810 Presence of automatic (implantable) cardiac defibrillator: Secondary | ICD-10-CM | POA: Diagnosis not present

## 2021-04-23 DIAGNOSIS — I5023 Acute on chronic systolic (congestive) heart failure: Secondary | ICD-10-CM | POA: Diagnosis not present

## 2021-04-23 DIAGNOSIS — I442 Atrioventricular block, complete: Secondary | ICD-10-CM | POA: Diagnosis not present

## 2021-04-23 LAB — BASIC METABOLIC PANEL
Anion gap: 6 (ref 5–15)
BUN: 65 mg/dL — ABNORMAL HIGH (ref 8–23)
CO2: 26 mmol/L (ref 22–32)
Calcium: 7.7 mg/dL — ABNORMAL LOW (ref 8.9–10.3)
Chloride: 103 mmol/L (ref 98–111)
Creatinine, Ser: 2.52 mg/dL — ABNORMAL HIGH (ref 0.61–1.24)
GFR, Estimated: 23 mL/min — ABNORMAL LOW (ref >60)
Glucose, Bld: 94 mg/dL (ref 70–99)
Potassium: 4.2 mmol/L (ref 3.5–5.1)
Sodium: 135 mmol/L (ref 135–145)

## 2021-04-23 LAB — SURGICAL PCR SCREEN
MRSA, PCR: NEGATIVE
Staphylococcus aureus: NEGATIVE

## 2021-04-23 LAB — CBC
HCT: 36 % — ABNORMAL LOW (ref 39.0–52.0)
Hemoglobin: 11.7 g/dL — ABNORMAL LOW (ref 13.0–17.0)
MCH: 29.1 pg (ref 26.0–34.0)
MCHC: 32.5 g/dL (ref 30.0–36.0)
MCV: 89.6 fL (ref 80.0–100.0)
Platelets: 184 10*3/uL (ref 150–400)
RBC: 4.02 MIL/uL — ABNORMAL LOW (ref 4.22–5.81)
RDW: 14.3 % (ref 11.5–15.5)
WBC: 8.8 10*3/uL (ref 4.0–10.5)
nRBC: 0 % (ref 0.0–0.2)

## 2021-04-23 LAB — GLUCOSE, CAPILLARY
Glucose-Capillary: 93 mg/dL (ref 70–99)
Glucose-Capillary: 113 mg/dL — ABNORMAL HIGH (ref 70–99)
Glucose-Capillary: 104 mg/dL — ABNORMAL HIGH (ref 70–99)
Glucose-Capillary: 130 mg/dL — ABNORMAL HIGH (ref 70–99)

## 2021-04-23 MED ORDER — SODIUM CHLORIDE 0.9 % IV SOLN
80.0000 mg | INTRAVENOUS | Status: AC
  Administered 2021-04-24: 80 mg

## 2021-04-23 MED ORDER — CHLORHEXIDINE GLUCONATE 4 % EX LIQD
60.0000 mL | Freq: Once | CUTANEOUS | Status: DC
  Filled 2021-04-23: qty 30
  Filled 2021-04-23: qty 60

## 2021-04-23 MED ORDER — CHLORHEXIDINE GLUCONATE 4 % EX LIQD
60.0000 mL | Freq: Once | CUTANEOUS | Status: AC
  Administered 2021-04-24: 4 via TOPICAL
  Filled 2021-04-23 (×3): qty 60

## 2021-04-23 MED ORDER — SODIUM CHLORIDE 0.9% FLUSH: 3.0000 mL | INTRAVENOUS | Status: DC | PRN

## 2021-04-23 MED ORDER — SODIUM CHLORIDE 0.9 % IV SOLN: INTRAVENOUS | Status: DC

## 2021-04-23 MED ORDER — MELATONIN 5 MG PO TABS: 5.0000 mg | ORAL_TABLET | Freq: Every evening | ORAL | Status: DC | PRN

## 2021-04-23 MED ORDER — SODIUM CHLORIDE 0.9 % IV SOLN
250.0000 mL | INTRAVENOUS | Status: DC
  Administered 2021-04-24: 250 mL via INTRAVENOUS

## 2021-04-23 MED ORDER — CEFAZOLIN SODIUM-DEXTROSE 2-4 GM/100ML-% IV SOLN
2.0000 g | INTRAVENOUS | Status: AC
  Administered 2021-04-24: 2 g via INTRAVENOUS
  Filled 2021-04-23: qty 100

## 2021-04-23 NOTE — Progress Notes (Signed)
NAME:  [name], MRN:  [id number], DOB:  Sep 06, 1926, LOS: 68 ADMISSION DATE:  04/12/2021, CONSULTATION DATE:  04/21/2021 REFERRING MD:  Dr. Roger Shelter, CHIEF COMPLAINT:  Complete Heart Block    History of Present Illness:  87 year old male presents to ED on 11/30 with shortness of breath and productive cough for last 2-3 days. EMS reports oxygen saturation 60% on room air, placed on NRB with improvement. On arrival to ED CXR with right lower lobe airspace opacity and small right pleural effusion, BNP 1568, continued tachypnea, placed on BiPAP. Throughout stay treated for Sepsis PNA with cefepime/azithromycin for 8 days, required low dose vasopressors however has remained off since 12/2, completed course of solu-cortef. Complicated by symptomatic bradycardia, Cardiology consulted, despite dopamine gtt external pacing required 12/8. Decision made to transfer to [name] for EP consult for consideration for generator change.   PCCM consulted for management at [facility]. Now transferred to [facility].  Pertinent  Medical History  Cardiomyopathy, HTN, Systolic HF (EF 75-17), CAD s/p CABG 12/2020, s/p AICD, CKD stage 3 (baseline crt 1-1.3), prostate cancer   Significant Hospital Events: Including procedures, antibiotic start and stop dates in addition to other pertinent events   11/30 > Presents to [facility] ED  12/6 bradycardia heart rate 20s and hypotension requiring atropine 12/8 > external pacing placed for symptomatic bradycardia despite dopamine. 12/9 > transferred to [facility]  12/10 early morning TVP placed in cath lab  Interim History / Subjective:  No overnight issues.   Objective   Blood pressure (!) 133/57, pulse 69, temperature 98.2 F (36.8 C), temperature source Oral, resp. rate 14, height 5\' 4"  (1.626 m), weight 76.4 kg, SpO2 95 %.        Intake/Output Summary (Last 24 hours) at 04/23/2021 1214 Last data filed at 04/23/2021 0800 Gross per 24 hour  Intake 540 ml  Output 1400 ml  Net -860 ml    Filed Weights   04/21/21 0413 04/22/21 0313 04/23/21 0646  Weight: 76.7 kg 79.9 kg 76.4 kg    Examination: Gen:      Resting comfortably no distress HEENT:  nasal cannula Lungs:     no wheezes or crackles CV:         NSR - paced Abd:      Soft, non distended Ext:    No edema Neuro:   Aox4, no focal deficits   Resolved Hospital Problem list   Acute Hypoxic Respiratory Failure in setting of CAP  Septic Shock  Bilateral Lower Lobe PNA-resolved  Hyperkalemia  Assessment & Plan:  Complete Heart Block requiring Transvenous pacemaker wires -H/O AICD placement, issues reported with generator, not replaced in past due to improved EF  - contnue TVP, now off chronotropic support - plan for OR Monday for generator replacement and lead assessment with EP.   New onset Transient A.Fib  Plan -Plan for OP 30 day monitor   CAD s/p CABG  Hypertension Chronic Systolic Heart Failure -EF 40-45 12/2020  HLD  Plan -Continue ASA -Continue Statin  -Continue to hold Coreg, Losartan, Spironolactone due to borderline hypotension and AKI  Acute on CKD stage 3 -Baseline 1-1.3 -Trend BMP -Avoid nephrotoxic agents -Continue holding losartan & aldactone  Type 2 DM with hyperglycemia - increase SSI due to hyperglycemia, likely worsened by recent prednisone - A1C 7.2%  Constipation - bowel regimen  Best Practice (right click and "Reselect all SmartList Selections" daily)   Diet/type: Regular consistency (see orders) NPO at midnight DVT prophylaxis: prophylactic heparin  GI prophylaxis: N/A Lines: Central line and yes and it is still needed Foley:  N/A Code Status:  DNR Last date of multidisciplinary goals of care discussion [12/9 continue DNR]  Labs   CBC: Recent Labs  Lab 04/18/21 1514 04/20/21 0425 04/21/21 0427 04/22/21 0531 04/23/21 0414  WBC 7.8 10.2 11.2* 7.6 8.8  HGB 13.2 11.9* 13.1 10.7* 11.7*  HCT 41.5 38.4* 42.9 32.8* 36.0*  MCV 93.7 95.8 93.9 90.4 89.6  PLT 127*  119* 210 151 323    Basic Metabolic Panel: Recent Labs  Lab 04/18/21 1514 04/20/21 0425 04/21/21 0427 04/21/21 0847 04/21/21 1312 04/22/21 0326 04/23/21 0414  NA 132* 136 133* 132*  --  133* 135  K 5.2* 4.9 5.6* 5.3* 5.0 4.7 4.2  CL 101 105 103 101  --  102 103  CO2 21* 25 22 23   --  23 26  GLUCOSE 350* 227* 244* 282*  --  283* 94  BUN 64* 78* 77* 79*  --  74* 65*  CREATININE 2.14* 2.82* 2.67* 2.79*  --  2.77* 2.52*  CALCIUM 8.0* 8.0* 8.1* 8.1*  --  7.8* 7.7*  MG 2.3 2.2  --   --   --  2.1  --   PHOS  --  3.0  --   --   --  3.0  --    GFR: Estimated Creatinine Clearance: 17.5 mL/min (A) (by C-G formula based on SCr of 2.52 mg/dL (H)). Recent Labs  Lab 04/20/21 0425 04/21/21 0427 04/22/21 0531 04/23/21 0414  WBC 10.2 11.2* 7.6 8.8    Liver Function Tests: No results for input(s): AST, ALT, ALKPHOS, BILITOT, PROT, ALBUMIN in the last 168 hours. No results for input(s): LIPASE, AMYLASE in the last 168 hours. No results for input(s): AMMONIA in the last 168 hours.  ABG    Component Value Date/Time   PHART 7.347 (L) 04/13/2021 0030   PCO2ART 49.6 (H) 04/13/2021 0030   PO2ART 84.0 04/13/2021 0030   HCO3 25.0 04/13/2021 0030   O2SAT 94.4 04/13/2021 0030     Coagulation Profile: No results for input(s): INR, PROTIME in the last 168 hours.  Cardiac Enzymes: No results for input(s): CKTOTAL, CKMB, CKMBINDEX, TROPONINI in the last 168 hours.  HbA1C: Hgb A1c MFr Bld  Date/Time Value Ref Range Status  04/21/2021 04:27 AM 7.2 (H) 4.8 - 5.6 % Final    Comment:    (NOTE) Pre diabetes:          5.7%-6.4%  Diabetes:              >6.4%  Glycemic control for   <7.0% adults with diabetes   11/07/2020 10:35 AM 5.9 (H) 4.8 - 5.6 % Final    Comment:    (NOTE)         Prediabetes: 5.7 - 6.4         Diabetes: >6.4         Glycemic control for adults with diabetes: <7.0    The patient is critically ill due to complete heart block.  Critical care was necessary to  treat or prevent imminent or life-threatening deterioration.  Critical care was time spent personally by me on the following activities: development of treatment plan with patient and/or surrogate as well as nursing, discussions with consultants, evaluation of patient's response to treatment, examination of patient, obtaining history from patient or surrogate, ordering and performing treatments and interventions, ordering and review of laboratory studies, ordering and review of radiographic studies, pulse oximetry,  re-evaluation of patient's condition and participation in multidisciplinary rounds.   Critical Care Time devoted to patient care services described in this note is 37 minutes. This time reflects time of care of this [place] . This critical care time does not reflect separately billable procedures or procedure time, teaching time or supervisory time of PA/NP/Med student/Med Resident etc but could involve care discussion time.       Spero Geralds [facility] 04/23/2021 12:14 PM  Pager: see AMION  If no response to pager , please call critical care on call (see AMION) until 7pm After 7:00 pm call [place]

## 2021-04-23 NOTE — Progress Notes (Signed)
Progress Note  Patient Name: [name] Date of Encounter: 04/23/2021  [facility] Cardiologist: Rozann Lesches, MD   Subjective   NAEO. TVP threshold higher today at 73mA from 1.80mA yesterday.  Inpatient Medications    Scheduled Meds:  aspirin EC  81 mg Oral Daily   Chlorhexidine Gluconate Cloth  6 each Topical Daily   docusate sodium  200 mg Oral Daily   feeding supplement  237 mL Oral BID BM   insulin aspart  0-15 Units Subcutaneous TID WC   mouth rinse  15 mL Mouth Rinse BID   polyethylene glycol  17 g Oral Daily   simvastatin  40 mg Oral q1800   sodium chloride flush  3 mL Intravenous Q12H   Continuous Infusions:  sodium chloride 250 mL (04/13/21 0915)   PRN Meds: acetaminophen **OR** acetaminophen, bisacodyl, fentaNYL (SUBLIMAZE) injection, guaiFENesin-dextromethorphan, ipratropium-albuterol, ondansetron (ZOFRAN) IV   Vital Signs    Vitals:   04/23/21 0400 04/23/21 0500 04/23/21 0600 04/23/21 0646  BP: (!) 97/52 (!) 97/49 100/63   Pulse: 78 80 80   Resp: 15 14 14    Temp: 98 F (36.7 C)     TempSrc: Axillary     SpO2: 93% 94% 95%   Weight:    76.4 kg  Height:        Intake/Output Summary (Last 24 hours) at 04/23/2021 0801 Last data filed at 04/23/2021 0500 Gross per 24 hour  Intake 850 ml  Output 700 ml  Net 150 ml   Last 3 Weights 04/23/2021 04/22/2021 04/21/2021  Weight (lbs) 168 lb 6.9 oz 176 lb 2.4 oz 169 lb 1.5 oz  Weight (kg) 76.4 kg 79.9 kg 76.7 kg      Telemetry    Personally Reviewed  ECG    Personally Reviewed  Physical Exam   GEN: No acute distress.   Neck: No JVD Cardiac: RRR, no murmurs, rubs, or gallops. TVP in place. Respiratory: Clear to auscultation bilaterally. GI: Soft, nontender, non-distended  MS: No edema; No deformity. Neuro:  Nonfocal  Psych: Normal affect   Labs    High Sensitivity Troponin:   Recent Labs  Lab 04/12/21 2143 04/12/21 2316 04/13/21 0142 04/13/21 0418  TROPONINIHS 162* 184* 189*  221*     Chemistry Recent Labs  Lab 04/18/21 1514 04/20/21 0425 04/21/21 0427 04/21/21 0847 04/21/21 1312 04/22/21 0326 04/23/21 0414  NA 132* 136   < > 132*  --  133* 135  K 5.2* 4.9   < > 5.3* 5.0 4.7 4.2  CL 101 105   < > 101  --  102 103  CO2 21* 25   < > 23  --  23 26  GLUCOSE 350* 227*   < > 282*  --  283* 94  BUN 64* 78*   < > 79*  --  74* 65*  CREATININE 2.14* 2.82*   < > 2.79*  --  2.77* 2.52*  CALCIUM 8.0* 8.0*   < > 8.1*  --  7.8* 7.7*  MG 2.3 2.2  --   --   --  2.1  --   GFRNONAA 28* 20*   < > 21*  --  21* 23*  ANIONGAP 10 6   < > 8  --  8 6   < > = values in this interval not displayed.    Lipids No results for input(s): CHOL, TRIG, HDL, LABVLDL, LDLCALC, CHOLHDL in the last 168 hours.  Hematology Recent Labs  Lab 04/21/21  9038 04/22/21 0531 04/23/21 0414  WBC 11.2* 7.6 8.8  RBC 4.57 3.63* 4.02*  HGB 13.1 10.7* 11.7*  HCT 42.9 32.8* 36.0*  MCV 93.9 90.4 89.6  MCH 28.7 29.5 29.1  MCHC 30.5 32.6 32.5  RDW 13.9 13.9 14.3  PLT 210 151 184   Thyroid  Recent Labs  Lab 04/22/21 0326  TSH 3.006    BNP Recent Labs  Lab 04/18/21 0639  BNP 2,074.0*    DDimer No results for input(s): DDIMER in the last 168 hours.   Radiology    CARDIAC CATHETERIZATION  Result Date: 04/22/2021 Successful placement of Tempo temporary transvenous pacing wire.    Cardiac Studies     Patient Profile     [name] is a 87 y.o. male with a hx of chronic systolic HF, CAD s/p prior CABG, prior ICD now nonfunctional because of depleted battery who was seen 04/22/2021 for the evaluation of complete heart block.  Assessment & Plan    Complete heart block Poor underlying escape. Now with TVP. Threshold higher today at 67mA.  Will require generator replacement with assessment of his existing RA/RV lead. If possible, plan for addition of CS lead vs. LBBB area lead given low EF and anticipated high pacing burden. Keep NPO Sunday night for tentative procedure Monday.    Risks, benefits, alternatives to PPM implantation were discussed in detail with the patient today. The patient understands that the risks include but are not limited to bleeding, infection, pneumothorax, perforation, tamponade, vascular damage, renal failure, MI, stroke, death, and lead dislodgement and wishes to proceed.   This morning, TVP threshold 15mA. No underlying at VVI 30.   Portable CXR ordered.  For questions or updates, please contact [place] Please consult [email or web address] for contact info under        Signed, Vickie Epley, MD  04/23/2021, 8:01 AM

## 2021-04-24 ENCOUNTER — Inpatient Hospital Stay (HOSPITAL_COMMUNITY): Payer: Medicare Other

## 2021-04-24 ENCOUNTER — Encounter (HOSPITAL_COMMUNITY)
Admission: EM | Disposition: A | Discharge status: Skilled Nursing Facility | Payer: Self-pay | Source: Home / Self Care | Attending: Internal Medicine

## 2021-04-24 ENCOUNTER — Encounter (HOSPITAL_COMMUNITY): Payer: Self-pay | Admitting: Cardiology

## 2021-04-24 DIAGNOSIS — I5023 Acute on chronic systolic (congestive) heart failure: Secondary | ICD-10-CM | POA: Diagnosis not present

## 2021-04-24 DIAGNOSIS — I442 Atrioventricular block, complete: Secondary | ICD-10-CM | POA: Diagnosis not present

## 2021-04-24 HISTORY — PX: PACEMAKER IMPLANT: EP1218

## 2021-04-24 LAB — CBC
HCT: 36.8 % — ABNORMAL LOW (ref 39.0–52.0)
Hemoglobin: 11.8 g/dL — ABNORMAL LOW (ref 13.0–17.0)
MCH: 28.9 pg (ref 26.0–34.0)
MCHC: 32.1 g/dL (ref 30.0–36.0)
MCV: 90.2 fL (ref 80.0–100.0)
Platelets: 190 10*3/uL (ref 150–400)
RBC: 4.08 MIL/uL — ABNORMAL LOW (ref 4.22–5.81)
RDW: 14.6 % (ref 11.5–15.5)
WBC: 8.3 10*3/uL (ref 4.0–10.5)
nRBC: 0 % (ref 0.0–0.2)

## 2021-04-24 LAB — BASIC METABOLIC PANEL
Anion gap: 8 (ref 5–15)
BUN: 53 mg/dL — ABNORMAL HIGH (ref 8–23)
CO2: 25 mmol/L (ref 22–32)
Calcium: 7.9 mg/dL — ABNORMAL LOW (ref 8.9–10.3)
Chloride: 105 mmol/L (ref 98–111)
Creatinine, Ser: 2.31 mg/dL — ABNORMAL HIGH (ref 0.61–1.24)
GFR, Estimated: 26 mL/min — ABNORMAL LOW (ref >60)
Glucose, Bld: 107 mg/dL — ABNORMAL HIGH (ref 70–99)
Potassium: 4.2 mmol/L (ref 3.5–5.1)
Sodium: 138 mmol/L (ref 135–145)

## 2021-04-24 LAB — GLUCOSE, CAPILLARY
Glucose-Capillary: 104 mg/dL — ABNORMAL HIGH (ref 70–99)
Glucose-Capillary: 100 mg/dL — ABNORMAL HIGH (ref 70–99)
Glucose-Capillary: 95 mg/dL (ref 70–99)
Glucose-Capillary: 212 mg/dL — ABNORMAL HIGH (ref 70–99)

## 2021-04-24 LAB — PROTIME-INR
INR: 1.2 (ref 0.8–1.2)
Prothrombin Time: 14.9 seconds (ref 11.4–15.2)

## 2021-04-24 SURGERY — PACEMAKER IMPLANT

## 2021-04-24 MED ORDER — ACETAMINOPHEN 325 MG PO TABS: 325.0000 mg | ORAL_TABLET | ORAL | Status: DC | PRN

## 2021-04-24 MED ORDER — MIDAZOLAM HCL 5 MG/5ML IJ SOLN
INTRAMUSCULAR | Status: DC | PRN
  Administered 2021-04-24: 1 mg via INTRAVENOUS

## 2021-04-24 MED ORDER — HEPARIN (PORCINE) IN NACL 1000-0.9 UT/500ML-% IV SOLN
INTRAVENOUS | Status: AC
  Filled 2021-04-24: qty 500

## 2021-04-24 MED ORDER — HEPARIN (PORCINE) IN NACL 1000-0.9 UT/500ML-% IV SOLN
INTRAVENOUS | Status: DC | PRN
  Administered 2021-04-24: 500 mL

## 2021-04-24 MED ORDER — IOHEXOL 350 MG/ML SOLN
INTRAVENOUS | Status: DC | PRN
  Administered 2021-04-24: 20 mL

## 2021-04-24 MED ORDER — LIDOCAINE HCL (PF) 1 % IJ SOLN
INTRAMUSCULAR | Status: AC
  Filled 2021-04-24: qty 60

## 2021-04-24 MED ORDER — FENTANYL CITRATE (PF) 100 MCG/2ML IJ SOLN
INTRAMUSCULAR | Status: DC | PRN
  Administered 2021-04-24: 12.5 ug via INTRAVENOUS

## 2021-04-24 MED ORDER — LIDOCAINE HCL (PF) 1 % IJ SOLN
INTRAMUSCULAR | Status: DC | PRN
  Administered 2021-04-24: 60 mL

## 2021-04-24 MED ORDER — CEFAZOLIN SODIUM-DEXTROSE 1-4 GM/50ML-% IV SOLN
1.0000 g | Freq: Three times a day (TID) | INTRAVENOUS | Status: AC
  Administered 2021-04-24 (×2): 1 g via INTRAVENOUS
  Filled 2021-04-24 (×2): qty 50

## 2021-04-24 MED ORDER — CEFAZOLIN SODIUM-DEXTROSE 2-4 GM/100ML-% IV SOLN
INTRAVENOUS | Status: AC
  Filled 2021-04-24: qty 100

## 2021-04-24 MED ORDER — FENTANYL CITRATE (PF) 100 MCG/2ML IJ SOLN
INTRAMUSCULAR | Status: AC
  Filled 2021-04-24: qty 2

## 2021-04-24 MED ORDER — ONDANSETRON HCL 4 MG/2ML IJ SOLN: 4.0000 mg | Freq: Four times a day (QID) | INTRAMUSCULAR | Status: DC | PRN

## 2021-04-24 MED ORDER — MIDAZOLAM HCL 5 MG/5ML IJ SOLN
INTRAMUSCULAR | Status: AC
  Filled 2021-04-24: qty 5

## 2021-04-24 MED ORDER — SODIUM CHLORIDE 0.9 % IV SOLN
INTRAVENOUS | Status: AC
  Filled 2021-04-24: qty 2

## 2021-04-24 SURGICAL SUPPLY — 11 items
CABLE SURGICAL S-101-97-12 (CABLE) ×2 IMPLANT
GUIDEWIRE ANGLED .035X150CM (WIRE) ×1 IMPLANT
IPG PACE AZUR XT DR MRI W1DR01 (Pacemaker) IMPLANT
KIT LEAD END CAP (Cap) IMPLANT
LEAD CAPSURE NOVUS 5076-52CM (Lead) ×1 IMPLANT
LEAD END CAP (Cap) ×1 IMPLANT
MAT PREVALON FULL STRYKER (MISCELLANEOUS) ×1 IMPLANT
PACE AZURE XT DR MRI W1DR01 (Pacemaker) ×2 IMPLANT
PAD DEFIB RADIO PHYSIO CONN (PAD) ×2 IMPLANT
SHEATH 7FR PRELUDE SNAP 13 (SHEATH) ×1 IMPLANT
TRAY PACEMAKER INSERTION (PACKS) ×2 IMPLANT

## 2021-04-24 NOTE — Progress Notes (Addendum)
Electrophysiology Rounding Note  Patient Name: [name] Date of Encounter: 04/24/2021  Primary Cardiologist: Rozann Lesches, MD Electrophysiologist: Dr. Lovena Le   Subjective   The patient is doing well today.  At this time, the patient denies chest pain, shortness of breath, or any new concerns.  Inpatient Medications    Scheduled Meds:  aspirin EC  81 mg Oral Daily   chlorhexidine  60 mL Topical Once   chlorhexidine  60 mL Topical Once   Chlorhexidine Gluconate Cloth  6 each Topical Daily   docusate sodium  200 mg Oral Daily   feeding supplement  237 mL Oral BID BM   gentamicin irrigation  80 mg Irrigation On Call   insulin aspart  0-15 Units Subcutaneous TID WC   mouth rinse  15 mL Mouth Rinse BID   polyethylene glycol  17 g Oral Daily   simvastatin  40 mg Oral q1800   sodium chloride flush  3 mL Intravenous Q12H   Continuous Infusions:  sodium chloride 250 mL (04/13/21 0915)   sodium chloride 50 mL/hr at 04/24/21 0631   sodium chloride 250 mL (04/24/21 0632)    ceFAZolin (ANCEF) IV     PRN Meds: acetaminophen **OR** acetaminophen, bisacodyl, fentaNYL (SUBLIMAZE) injection, guaiFENesin-dextromethorphan, ipratropium-albuterol, melatonin, ondansetron (ZOFRAN) IV, sodium chloride flush   Vital Signs    Vitals:   04/24/21 0300 04/24/21 0400 04/24/21 0500 04/24/21 0600  BP: (!) 132/55 (!) 127/56 (!) 139/46 132/64  Pulse: 68 70 70 65  Resp: 12 14 17 19   Temp:      TempSrc:      SpO2: 93% 95% 97% 96%  Weight:   74.7 kg   Height:        Intake/Output Summary (Last 24 hours) at 04/24/2021 0724 Last data filed at 04/24/2021 0600 Gross per 24 hour  Intake 420 ml  Output 1550 ml  Net -1130 ml   Filed Weights   04/22/21 0313 04/23/21 0646 04/24/21 0500  Weight: 79.9 kg 76.4 kg 74.7 kg    Physical Exam    GEN- The patient is well appearing, alert and oriented x 3 today.   Head- normocephalic, atraumatic Eyes-  Sclera clear, conjunctiva pink Ears-  hearing intact Oropharynx- clear Neck- supple Lungs- Clear to ausculation bilaterally, normal work of breathing Heart- Regular rate and rhythm, no murmurs, rubs or gallops GI- soft, NT, ND, + BS Extremities- no clubbing or cyanosis. No edema Skin- no rash or lesion Psych- euthymic mood, full affect Neuro- strength and sensation are intact  Labs    CBC Recent Labs    04/23/21 0414 04/24/21 0511  WBC 8.8 8.3  HGB 11.7* 11.8*  HCT 36.0* 36.8*  MCV 89.6 90.2  PLT 184 160   Basic Metabolic Panel Recent Labs    04/22/21 0326 04/23/21 0414 04/24/21 0511  NA 133* 135 138  K 4.7 4.2 4.2  CL 102 103 105  CO2 23 26 25   GLUCOSE 283* 94 107*  BUN 74* 65* 53*  CREATININE 2.77* 2.52* 2.31*  CALCIUM 7.8* 7.7* 7.9*  MG 2.1  --   --   PHOS 3.0  --   --    Liver Function Tests No results for input(s): AST, ALT, ALKPHOS, BILITOT, PROT, ALBUMIN in the last 72 hours. No results for input(s): LIPASE, AMYLASE in the last 72 hours. Cardiac Enzymes No results for input(s): CKTOTAL, CKMB, CKMBINDEX, TROPONINI in the last 72 hours.   Telemetry    V pacing 70 (personally reviewed)  Radiology    DG CHEST PORT 1 VIEW  Result Date: 04/23/2021 CLINICAL DATA:  Hypertension. EXAM: PORTABLE CHEST 1 VIEW COMPARISON:  April 18, 2021. FINDINGS: Stable cardiomegaly. Left-sided defibrillator is unchanged in position. Status post coronary bypass graft. Right internal jugular catheter is unchanged. Left lung is clear. Mild right basilar atelectasis is noted with possible small right pleural effusion. Bony thorax is unremarkable. IMPRESSION: Mild right basilar atelectasis is noted with possible small right pleural effusion. Electronically Signed   By: Marijo Conception M.D.   On: 04/23/2021 08:38    Patient Profile     [name] is a 87 y.o. male with a hx of chronic systolic HF, CAD s/p prior CABG, prior ICD now nonfunctional because of depleted battery who was seen 04/22/2021 for the  evaluation of complete heart block.  Assessment & Plan    Complete Heart Block Poor underlying escape. Now with TVP. Threshold has been > 56mA.  Will require gen change with assessment of his existing RV lead. May need to plan for addition of CS lead vs LBBB area lead given low EF and anticipated high pacing burden.   2. Chronic systolic CHF EF 03-47% 08/2593  3. CAD Significant disease with patent grafts 12/19/2020 Medical management recommended  Dr. Lovena Le has seen. Plan for L venogram to ascertain patency of L side for device planning.   For questions or updates, please contact [place] Please consult [email or web address] for contact info under Cardiology/STEMI.  Signed, Shirley Friar, PA-C  04/24/2021, 7:24 AM   EP Attending  Patient seen and examined. Agree with above. The patient is stable. He has developed CHB and is currently pacing via a temporary PM placed in his right IJ. I have reviewed the indications/risks/benefits/goals/expectations of PPM insertion and he wishes to proceed.  Carleene Overlie Sayana Salley,MD

## 2021-04-24 NOTE — TOC Progression Note (Signed)
Transition of Care [facility]) - Progression Note    Patient Details  Name: [name] MRN: [id number] Date of Birth: [birth year]  Transition of Care [facility]) CM/SW [place], [place] Phone Number: 04/24/2021, 4:24 PM  Clinical Narrative:     Patient has SNF bed at [facility] when medically ready. CSW will continue to follow and assist with patients dc planning needs.  Expected Discharge Plan: [place] Barriers to Discharge: Continued Medical Work up  Expected Discharge Plan and Services Expected Discharge Plan: [place] arrangements for the past 2 months: Single Family Home                                       Social Determinants of Health (SDOH) Interventions    Readmission Risk Interventions Readmission Risk Prevention Plan 04/17/2021 04/13/2021  Transportation Screening Complete Complete  PCP or Specialist Appt within 3-5 Days - Complete  HRI or [place] - Complete  Social Work Consult for [place] Planning/Counseling - Complete  Palliative Care Screening - Complete  Medication Review Press photographer) Complete Complete  PCP or Specialist appointment within 3-5 days of discharge Complete -  [place] or Home Care Consult Complete -  SW Recovery Care/Counseling Consult Complete -  Palliative Care Screening Not Applicable -  [place] Complete -  Some recent data might be hidden

## 2021-04-24 NOTE — Interval H&P Note (Signed)
History and Physical Interval Note:  04/24/2021 84:83 PM  [name]  has presented today for surgery, with the diagnosis of heart block.  The various methods of treatment have been discussed with the patient and family. After consideration of risks, benefits and other options for treatment, the patient has consented to  Procedure(s): PACEMAKER IMPLANT (N/A) as a surgical intervention.  The patient's history has been reviewed, patient examined, no change in status, stable for surgery.  I have reviewed the patient's chart and labs.  Questions were answered to the patient's satisfaction.     Cristopher Peru

## 2021-04-24 NOTE — Progress Notes (Signed)
0700 - Report received from PM RN.  All questions answered.  Safety checks performed.  All lines verified.  Hand hygiene performed before/after each pt contact. 0730 - Dr. Lovena Le and Jonni Sanger, PA rounded on pt.  Consent obtained from pt for impending procedure. 0800 - Dr. Lynetta Mare rounded on pt.  Assessment performed. 1000 - Pt's family arrived to visit. 1130 - Pt signed consent form.  Consent form placed in pt's chart. 1300 - Transport arrived to take pt to cath lab. 1430 - Cath lab called to give report on pt.  They reported they placed an atrial lead and replaced pt's device.  They were able to leave the existing ventricular lead.  All questions answered. 1450 - Pt returned from Cath lab.  All lines verified.  Pt's family notified. 1545 - Pt's venous sheath D/C'd per order.  No complications. 1700 - Pt assisted to chair without difficulty or distress with Nicki Reaper, RN. 1800 - Pt uncomfortable in chair.  Pt stood.  Small BM noticed.  Pt cleaned and repositioned in the chair. 1900 - Report given to PM RN.  All questions answered.

## 2021-04-24 NOTE — Discharge Instructions (Signed)
After Your Pacemaker   You have a Medtronic Pacemaker  ACTIVITY Do not lift your arm above shoulder height for 1 week after your procedure. After 7 days, you may progress as below.  You should remove your sling 24 hours after your procedure, unless otherwise instructed by your provider.     Monday May 01, 2021  Tuesday May 02, 2021 Wednesday May 03, 2021 Thursday May 04, 2021   Do not lift, push, pull, or carry anything over 10 pounds with the affected arm until 6 weeks (Monday June 05, 2021 ) after your procedure.   You may drive AFTER your wound check, unless you have been told otherwise by your provider.   Ask your healthcare provider when you can go back to work   INCISION/Dressing If you are on a blood thinner such as Coumadin, Xarelto, Eliquis, Plavix, or Pradaxa please confirm with your provider when this should be resumed.   If large square, outer bandage is left in place, this can be removed after 24 hours from your procedure. Do not remove steri-strips or glue as below.   Monitor your Pacemaker site for redness, swelling, and drainage. Call the device clinic at [phone] if you experience these symptoms or fever/chills.  If your incision is sealed with Steri-strips or staples, you may shower 10 days after your procedure or when told by your provider. Do not remove the steri-strips or let the shower hit directly on your site. You may wash around your site with soap and water.    If you were discharged in a sling, please do not wear this during the day more than 48 hours after your surgery unless otherwise instructed. This may increase the risk of stiffness and soreness in your shoulder.   Avoid lotions, ointments, or perfumes over your incision until it is well-healed.  You may use a hot tub or a pool AFTER your wound check appointment if the incision is completely closed.  PAcemaker Alerts:  Some alerts are vibratory and others beep. These are NOT  emergencies. Please call our office to let us know. If this occurs at night or on weekends, it can wait until the next business day. Send a remote transmission.  If your device is capable of reading fluid status (for heart failure), you will be offered monthly monitoring to review this with you.   DEVICE MANAGEMENT Remote monitoring is used to monitor your pacemaker from home. This monitoring is scheduled every 91 days by our office. It allows [place] to keep an eye on the functioning of your device to ensure it is working properly. You will routinely see your Electrophysiologist annually (more often if necessary).   You should receive your ID card for your new device in 4-8 weeks. Keep this card with you at all times once received. Consider wearing a medical alert bracelet or necklace.  Your Pacemaker may be MRI compatible. This will be discussed at your next office visit/wound check.  You should avoid contact with strong electric or magnetic fields.   Do not use amateur (ham) radio equipment or electric (arc) welding torches. MP3 player headphones with magnets should not be used. Some devices are safe to use if held at least 12 inches (30 cm) from your Pacemaker. These include power tools, lawn mowers, and speakers. If you are unsure if something is safe to use, ask your health care provider.  When using your cell phone, hold it to the ear that is on the opposite side from the  Pacemaker. Do not leave your cell phone in a pocket over the Pacemaker.  You may safely use electric blankets, heating pads, computers, and microwave ovens.  Call the office right away if: You have chest pain. You feel more short of breath than you have felt before. You feel more light-headed than you have felt before. Your incision starts to open up.  This information is not intended to replace advice given to you by your health care provider. Make sure you discuss any questions you have with your health care provider.

## 2021-04-24 NOTE — Progress Notes (Incomplete)
Nutrition Follow-up  DOCUMENTATION CODES:      INTERVENTION:  ***   NUTRITION DIAGNOSIS:   Moderate Malnutrition related to acute illness (pneumonia) as evidenced by energy intake < 75% for > 7 days, mild muscle depletion, moderate muscle depletion.  ***  GOAL:    (patient health care wishes)  ***  MONITOR:   PO intake, Labs  REASON FOR ASSESSMENT:   Malnutrition Screening Tool    ASSESSMENT:   Patient is a 87 yo male from home with hx of CAD, CHF, Vit B-12 deficiency and prostate cancer. He presents with shortness of breath, sespsis associated with pneumonia. Acute respiratory failure and requiring 10 L HFNC.  ***  Recorded po intake has been inadequate; recorded po intake 0-50% of meals; 30% of meals on average  NUTRITION - FOCUSED PHYSICAL EXAM:  {RD Focused Exam List:21252}  Diet Order:   Diet Order             Diet NPO time specified Except for: Sips with Meds  Diet effective midnight                   EDUCATION NEEDS:   Not appropriate for education at this time  Skin:  Skin Assessment: Reviewed RN Assessment (skin tear to left arm)  Last BM:  12/1 type 7  Height:   Ht Readings from Last 1 Encounters:  04/18/21 5\' 4"  (1.626 m)    Weight:   Wt Readings from Last 1 Encounters:  04/24/21 74.7 kg    Ideal Body Weight:     BMI:  Body mass index is 28.27 kg/m.  Estimated Nutritional Needs:   Kcal:  1800-1900  Protein:  86-94 gr  Fluid:  < 2liters daily  Kerman Passey MS, RDN, LDN, CNSC Registered Dietitian III Clinical Nutrition RD Pager and On-Call Pager Number Located in [place]

## 2021-04-24 NOTE — H&P (View-Only) (Signed)
Electrophysiology Rounding Note  Patient Name: [name] Date of Encounter: 04/24/2021  Primary Cardiologist: Rozann Lesches, MD Electrophysiologist: Dr. Lovena Le   Subjective   The patient is doing well today.  At this time, the patient denies chest pain, shortness of breath, or any new concerns.  Inpatient Medications    Scheduled Meds:  aspirin EC  81 mg Oral Daily   chlorhexidine  60 mL Topical Once   chlorhexidine  60 mL Topical Once   Chlorhexidine Gluconate Cloth  6 each Topical Daily   docusate sodium  200 mg Oral Daily   feeding supplement  237 mL Oral BID BM   gentamicin irrigation  80 mg Irrigation On Call   insulin aspart  0-15 Units Subcutaneous TID WC   mouth rinse  15 mL Mouth Rinse BID   polyethylene glycol  17 g Oral Daily   simvastatin  40 mg Oral q1800   sodium chloride flush  3 mL Intravenous Q12H   Continuous Infusions:  sodium chloride 250 mL (04/13/21 0915)   sodium chloride 50 mL/hr at 04/24/21 0631   sodium chloride 250 mL (04/24/21 0632)    ceFAZolin (ANCEF) IV     PRN Meds: acetaminophen **OR** acetaminophen, bisacodyl, fentaNYL (SUBLIMAZE) injection, guaiFENesin-dextromethorphan, ipratropium-albuterol, melatonin, ondansetron (ZOFRAN) IV, sodium chloride flush   Vital Signs    Vitals:   04/24/21 0300 04/24/21 0400 04/24/21 0500 04/24/21 0600  BP: (!) 132/55 (!) 127/56 (!) 139/46 132/64  Pulse: 68 70 70 65  Resp: 12 14 17 19   Temp:      TempSrc:      SpO2: 93% 95% 97% 96%  Weight:   74.7 kg   Height:        Intake/Output Summary (Last 24 hours) at 04/24/2021 0724 Last data filed at 04/24/2021 0600 Gross per 24 hour  Intake 420 ml  Output 1550 ml  Net -1130 ml   Filed Weights   04/22/21 0313 04/23/21 0646 04/24/21 0500  Weight: 79.9 kg 76.4 kg 74.7 kg    Physical Exam    GEN- The patient is well appearing, alert and oriented x 3 today.   Head- normocephalic, atraumatic Eyes-  Sclera clear, conjunctiva pink Ears-  hearing intact Oropharynx- clear Neck- supple Lungs- Clear to ausculation bilaterally, normal work of breathing Heart- Regular rate and rhythm, no murmurs, rubs or gallops GI- soft, NT, ND, + BS Extremities- no clubbing or cyanosis. No edema Skin- no rash or lesion Psych- euthymic mood, full affect Neuro- strength and sensation are intact  Labs    CBC Recent Labs    04/23/21 0414 04/24/21 0511  WBC 8.8 8.3  HGB 11.7* 11.8*  HCT 36.0* 36.8*  MCV 89.6 90.2  PLT 184 161   Basic Metabolic Panel Recent Labs    04/22/21 0326 04/23/21 0414 04/24/21 0511  NA 133* 135 138  K 4.7 4.2 4.2  CL 102 103 105  CO2 23 26 25   GLUCOSE 283* 94 107*  BUN 74* 65* 53*  CREATININE 2.77* 2.52* 2.31*  CALCIUM 7.8* 7.7* 7.9*  MG 2.1  --   --   PHOS 3.0  --   --    Liver Function Tests No results for input(s): AST, ALT, ALKPHOS, BILITOT, PROT, ALBUMIN in the last 72 hours. No results for input(s): LIPASE, AMYLASE in the last 72 hours. Cardiac Enzymes No results for input(s): CKTOTAL, CKMB, CKMBINDEX, TROPONINI in the last 72 hours.   Telemetry    V pacing 70 (personally reviewed)  Radiology    DG CHEST PORT 1 VIEW  Result Date: 04/23/2021 CLINICAL DATA:  Hypertension. EXAM: PORTABLE CHEST 1 VIEW COMPARISON:  April 18, 2021. FINDINGS: Stable cardiomegaly. Left-sided defibrillator is unchanged in position. Status post coronary bypass graft. Right internal jugular catheter is unchanged. Left lung is clear. Mild right basilar atelectasis is noted with possible small right pleural effusion. Bony thorax is unremarkable. IMPRESSION: Mild right basilar atelectasis is noted with possible small right pleural effusion. Electronically Signed   By: Marijo Conception M.D.   On: 04/23/2021 08:38    Patient Profile     [name] is a 87 y.o. male with a hx of chronic systolic HF, CAD s/p prior CABG, prior ICD now nonfunctional because of depleted battery who was seen 04/22/2021 for the  evaluation of complete heart block.  Assessment & Plan    Complete Heart Block Poor underlying escape. Now with TVP. Threshold has been > 62mA.  Will require gen change with assessment of his existing RV lead. May need to plan for addition of CS lead vs LBBB area lead given low EF and anticipated high pacing burden.   2. Chronic systolic CHF EF 52-84% 05/3242  3. CAD Significant disease with patent grafts 12/19/2020 Medical management recommended  Dr. Lovena Le has seen. Plan for L venogram to ascertain patency of L side for device planning.   For questions or updates, please contact [place] Please consult [email or web address] for contact info under Cardiology/STEMI.  Signed, Shirley Friar, PA-C  04/24/2021, 7:24 AM   EP Attending  Patient seen and examined. Agree with above. The patient is stable. He has developed CHB and is currently pacing via a temporary PM placed in his right IJ. I have reviewed the indications/risks/benefits/goals/expectations of PPM insertion and he wishes to proceed.  Carleene Overlie Vicky Schleich,MD

## 2021-04-24 NOTE — Progress Notes (Signed)
NAME:  [name], MRN:  [id number], DOB:  15-Jul-1926, LOS: 12 ADMISSION DATE:  04/12/2021, CONSULTATION DATE:  04/21/2021 REFERRING MD:  Dr. Roger [name], CHIEF COMPLAINT:  Complete Heart Block    History of Present Illness:  87 year old male presents to ED on 11/30 with shortness of breath and productive cough for last 2-3 days. EMS reports oxygen saturation 60% on room air, placed on NRB with improvement. On arrival to ED CXR with right lower lobe airspace opacity and small right pleural effusion, BNP 1568, continued tachypnea, placed on BiPAP. Throughout stay treated for Sepsis PNA with cefepime/azithromycin for 8 days, required low dose vasopressors however has remained off since 12/2, completed course of solu-cortef. Complicated by symptomatic bradycardia, Cardiology consulted, despite dopamine gtt external pacing required 12/8. Decision made to transfer to [name] for EP consult for consideration for generator change.   PCCM consulted for management at [facility]. Now transferred to [facility].  Pertinent  Medical History  Cardiomyopathy, HTN, Systolic HF (EF 63-87), CAD s/p CABG 12/2020, s/p AICD, CKD stage 3 (baseline crt 1-1.3), prostate cancer   Significant Hospital Events: Including procedures, antibiotic start and stop dates in addition to other pertinent events   11/30 > Presents to [facility] ED  12/6 bradycardia heart rate 20s and hypotension requiring atropine 12/8 > external pacing placed for symptomatic bradycardia despite dopamine. 12/9 > transferred to [facility]  12/10 early morning TVP placed in cath lab  Interim History / Subjective:    Objective   Blood pressure 132/64, pulse 65, temperature 98.3 F (36.8 C), temperature source Oral, resp. rate 19, height 5\' 4"  (1.626 m), weight 74.7 kg, SpO2 96 %.        Intake/Output Summary (Last 24 hours) at 04/24/2021 0802 Last data filed at 04/24/2021 0600 Gross per 24 hour  Intake 370 ml  Output 850 ml  Net -480 ml    Filed Weights    04/22/21 0313 04/23/21 0646 04/24/21 0500  Weight: 79.9 kg 76.4 kg 74.7 kg    Examination: Gen:      Resting comfortably no distress HEENT:  nasal cannula Lungs:     no wheezes or crackles CV:         NSR - paced HS normal Abd:      Soft, non distended Ext:    No edema Neuro:   Aox4, no focal deficits   Ancillary tests personally reviewed  Creatinine imrpving to 2.31 HB: 11.8  Assessment & Plan:  Critically ill due to Complete Heart Block requiring Transvenous pacemaker wires H/O AICD placement, issues reported with generator, not replaced in past due to improved EF  New onset Transient A.Fib  CAD s/p CABG  Hypertension Chronic mixed  Heart Failure with EF 40=45% HLD  Acute on CKD stage 3 Type 2 DM with hyperglycemia Constipation Pneumonia with septic shock on admission now resolved.   Plan:  - For PPM revision today.  - GDHT on hold while NPO. Will reassess post PPM revision.  - Completed treatment for pneumonia.  - Continue secondary prevention.  - Progressive ambulation once PPM corrected.  Best Practice (right click and "Reselect all SmartList Selections" daily)   Diet/type: Regular consistency (see orders) NPO at midnight DVT prophylaxis: prophylactic heparin  GI prophylaxis: N/A Lines: Central line and yes and it is still needed Foley:  N/A Code Status:  DNR Last date of multidisciplinary goals of care discussion [12/9 continue DNR]   The patient is critically ill due to complete heart block.  Critical care was necessary to treat or prevent imminent or life-threatening deterioration.  Critical care was time spent personally by me on the following activities: development of treatment plan with patient and/or surrogate as well as nursing, discussions with consultants, evaluation of patient's response to treatment, examination of patient, obtaining history from patient or surrogate, ordering and performing treatments and interventions, ordering and review of  laboratory studies, ordering and review of radiographic studies, pulse oximetry, re-evaluation of patient's condition and participation in multidisciplinary rounds.   Critical Care Time devoted to patient care services described in this note is 35 minutes. This time reflects time of care of this [name] . This critical care time does not reflect separately billable procedures or procedure time, teaching time or supervisory time of PA/NP/Med student/Med Resident etc but could involve care discussion time.       Luke Brood, MD [place] [facility] 04/24/2021 8:02 AM  Pager: see AMION  If no response to pager , please call critical care on call (see AMION) until 7pm After 7:00 pm call [place]

## 2021-04-25 ENCOUNTER — Encounter (HOSPITAL_COMMUNITY): Payer: Self-pay | Admitting: Internal Medicine

## 2021-04-25 DIAGNOSIS — J9602 Acute respiratory failure with hypercapnia: Secondary | ICD-10-CM | POA: Diagnosis not present

## 2021-04-25 DIAGNOSIS — I5023 Acute on chronic systolic (congestive) heart failure: Secondary | ICD-10-CM | POA: Diagnosis not present

## 2021-04-25 DIAGNOSIS — I5043 Acute on chronic combined systolic (congestive) and diastolic (congestive) heart failure: Secondary | ICD-10-CM | POA: Diagnosis not present

## 2021-04-25 DIAGNOSIS — Z515 Encounter for palliative care: Secondary | ICD-10-CM | POA: Diagnosis not present

## 2021-04-25 DIAGNOSIS — I517 Cardiomegaly: Secondary | ICD-10-CM | POA: Diagnosis not present

## 2021-04-25 DIAGNOSIS — E440 Moderate protein-calorie malnutrition: Secondary | ICD-10-CM | POA: Diagnosis not present

## 2021-04-25 DIAGNOSIS — R918 Other nonspecific abnormal finding of lung field: Secondary | ICD-10-CM | POA: Diagnosis not present

## 2021-04-25 DIAGNOSIS — M6281 Muscle weakness (generalized): Secondary | ICD-10-CM | POA: Diagnosis not present

## 2021-04-25 DIAGNOSIS — A419 Sepsis, unspecified organism: Secondary | ICD-10-CM | POA: Diagnosis not present

## 2021-04-25 DIAGNOSIS — M2550 Pain in unspecified joint: Secondary | ICD-10-CM | POA: Diagnosis not present

## 2021-04-25 DIAGNOSIS — I2510 Atherosclerotic heart disease of native coronary artery without angina pectoris: Secondary | ICD-10-CM | POA: Diagnosis not present

## 2021-04-25 DIAGNOSIS — Z7401 Bed confinement status: Secondary | ICD-10-CM | POA: Diagnosis not present

## 2021-04-25 DIAGNOSIS — I5033 Acute on chronic diastolic (congestive) heart failure: Secondary | ICD-10-CM | POA: Diagnosis not present

## 2021-04-25 DIAGNOSIS — E785 Hyperlipidemia, unspecified: Secondary | ICD-10-CM | POA: Diagnosis not present

## 2021-04-25 DIAGNOSIS — Z95810 Presence of automatic (implantable) cardiac defibrillator: Secondary | ICD-10-CM | POA: Diagnosis not present

## 2021-04-25 DIAGNOSIS — R531 Weakness: Secondary | ICD-10-CM | POA: Diagnosis not present

## 2021-04-25 DIAGNOSIS — Z66 Do not resuscitate: Secondary | ICD-10-CM | POA: Diagnosis not present

## 2021-04-25 DIAGNOSIS — I455 Other specified heart block: Secondary | ICD-10-CM | POA: Diagnosis not present

## 2021-04-25 DIAGNOSIS — R1312 Dysphagia, oropharyngeal phase: Secondary | ICD-10-CM | POA: Diagnosis not present

## 2021-04-25 DIAGNOSIS — R5381 Other malaise: Secondary | ICD-10-CM | POA: Diagnosis not present

## 2021-04-25 DIAGNOSIS — R001 Bradycardia, unspecified: Secondary | ICD-10-CM | POA: Diagnosis not present

## 2021-04-25 DIAGNOSIS — I442 Atrioventricular block, complete: Secondary | ICD-10-CM | POA: Diagnosis not present

## 2021-04-25 DIAGNOSIS — J9600 Acute respiratory failure, unspecified whether with hypoxia or hypercapnia: Secondary | ICD-10-CM | POA: Diagnosis not present

## 2021-04-25 DIAGNOSIS — J189 Pneumonia, unspecified organism: Secondary | ICD-10-CM | POA: Diagnosis not present

## 2021-04-25 DIAGNOSIS — R262 Difficulty in walking, not elsewhere classified: Secondary | ICD-10-CM | POA: Diagnosis not present

## 2021-04-25 DIAGNOSIS — I255 Ischemic cardiomyopathy: Secondary | ICD-10-CM | POA: Diagnosis not present

## 2021-04-25 DIAGNOSIS — Z20822 Contact with and (suspected) exposure to covid-19: Secondary | ICD-10-CM | POA: Diagnosis not present

## 2021-04-25 DIAGNOSIS — E871 Hypo-osmolality and hyponatremia: Secondary | ICD-10-CM | POA: Diagnosis not present

## 2021-04-25 DIAGNOSIS — I5022 Chronic systolic (congestive) heart failure: Secondary | ICD-10-CM | POA: Diagnosis not present

## 2021-04-25 DIAGNOSIS — J9601 Acute respiratory failure with hypoxia: Secondary | ICD-10-CM | POA: Diagnosis not present

## 2021-04-25 DIAGNOSIS — J181 Lobar pneumonia, unspecified organism: Secondary | ICD-10-CM | POA: Diagnosis not present

## 2021-04-25 DIAGNOSIS — R6521 Severe sepsis with septic shock: Secondary | ICD-10-CM | POA: Diagnosis not present

## 2021-04-25 DIAGNOSIS — I10 Essential (primary) hypertension: Secondary | ICD-10-CM | POA: Diagnosis not present

## 2021-04-25 LAB — MAGNESIUM: Magnesium: 1.8 mg/dL (ref 1.7–2.4)

## 2021-04-25 LAB — CBC WITH DIFFERENTIAL/PLATELET
Abs Immature Granulocytes: 0.03 10*3/uL (ref 0.00–0.07)
Basophils Absolute: 0 10*3/uL (ref 0.0–0.1)
Basophils Relative: 0 %
Eosinophils Absolute: 0 10*3/uL (ref 0.0–0.5)
Eosinophils Relative: 0 %
HCT: 34.2 % — ABNORMAL LOW (ref 39.0–52.0)
Hemoglobin: 10.8 g/dL — ABNORMAL LOW (ref 13.0–17.0)
Immature Granulocytes: 0 %
Lymphocytes Relative: 10 %
Lymphs Abs: 0.7 10*3/uL (ref 0.7–4.0)
MCH: 28.8 pg (ref 26.0–34.0)
MCHC: 31.6 g/dL (ref 30.0–36.0)
MCV: 91.2 fL (ref 80.0–100.0)
Monocytes Absolute: 2.8 10*3/uL — ABNORMAL HIGH (ref 0.1–1.0)
Monocytes Relative: 40 %
Neutro Abs: 3.4 10*3/uL (ref 1.7–7.7)
Neutrophils Relative %: 50 %
Platelets: 165 10*3/uL (ref 150–400)
RBC: 3.75 MIL/uL — ABNORMAL LOW (ref 4.22–5.81)
RDW: 14.6 % (ref 11.5–15.5)
WBC: 6.9 10*3/uL (ref 4.0–10.5)
nRBC: 0 % (ref 0.0–0.2)

## 2021-04-25 LAB — GLUCOSE, CAPILLARY
Glucose-Capillary: 121 mg/dL — ABNORMAL HIGH (ref 70–99)
Glucose-Capillary: 156 mg/dL — ABNORMAL HIGH (ref 70–99)
Glucose-Capillary: 91 mg/dL (ref 70–99)

## 2021-04-25 LAB — BASIC METABOLIC PANEL
Anion gap: 7 (ref 5–15)
BUN: 43 mg/dL — ABNORMAL HIGH (ref 8–23)
CO2: 25 mmol/L (ref 22–32)
Calcium: 7.8 mg/dL — ABNORMAL LOW (ref 8.9–10.3)
Chloride: 106 mmol/L (ref 98–111)
Creatinine, Ser: 1.91 mg/dL — ABNORMAL HIGH (ref 0.61–1.24)
GFR, Estimated: 32 mL/min — ABNORMAL LOW (ref >60)
Glucose, Bld: 132 mg/dL — ABNORMAL HIGH (ref 70–99)
Potassium: 4.1 mmol/L (ref 3.5–5.1)
Sodium: 138 mmol/L (ref 135–145)

## 2021-04-25 MED ORDER — CARVEDILOL 12.5 MG PO TABS
12.5000 mg | ORAL_TABLET | Freq: Two times a day (BID) | ORAL | Status: DC
  Administered 2021-04-25 (×2): 12.5 mg via ORAL
  Filled 2021-04-25 (×2): qty 1

## 2021-04-25 MED ORDER — FUROSEMIDE 10 MG/ML IJ SOLN
60.0000 mg | Freq: Once | INTRAMUSCULAR | Status: AC
  Administered 2021-04-25: 60 mg via INTRAVENOUS
  Filled 2021-04-25: qty 6

## 2021-04-25 NOTE — Progress Notes (Signed)
PT Cancellation Note  Patient Details Name: [name] MRN: [id number] DOB: [birth year]   Cancelled Treatment:    Reason Eval/Treat Not Completed: Patient preparing for d/c to SNF, will defer treatment.  Mabeline Caras, PT, DPT Acute Rehabilitation Services  Pager [phone] Office [place] 04/25/2021, 12:31 PM

## 2021-04-25 NOTE — Evaluation (Addendum)
Occupational Therapy Evaluation Patient Details Name: [name] MRN: [id number] DOB: 07-14-1926 Today's Date: 04/25/2021   History of Present Illness [name] is a 87 y.o. male presents with difficulty breathing. Pt admitted 11/30 at [place] with septic shock due to pneumonia. Noted with bradycardia and hypotension on 12/6 with external pacing placed on 12/8. Subsequently transferred to [facility] ICU on 12/9 and underwent pacemaker placement on 12/12. PMH: cardiomyopathy, hypertension, systolic CHF, coronary artery disease, CABG   Clinical Impression   PTA, pt lives with spouse and typically Modified Independent with ADLs, IADLs and mobility using cane for community mobility. Pt presents now with diagnoses above and deficits in cardiopulmonary tolerance, strength, and standing balance. Pt received ambulating in hallway with RN using Harmon Pier walker. Pt able to complete hallway mobility at min guard though required multiple standing rest breaks. Pt overall Setup for UB ADLs and Mod A for LB ADLs due to deficits. Educated on pacemaker precautions with handout provided. [name] present and supportive. Both pt and wife interested in [facility] rehab prior to return home. Educated that if they change their mind, pt also functionally appropriate to DC home with [facility] services and family assist.   SpO2 88% on RA after mobility, up to 90% with seated rest break BP WFL pre and post activity      Recommendations for follow up therapy are one component of a multi-disciplinary discharge planning process, led by the attending physician.  Recommendations may be updated based on patient status, additional functional criteria and insurance authorization.   Follow Up Recommendations  Skilled nursing-short term rehab (<3 hours/day) (also appropriate to DC home with [facility] services and family assist)    Assistance Recommended at Discharge Intermittent Supervision/Assistance  Functional Status Assessment   Patient has had a recent decline in their functional status and demonstrates the ability to make significant improvements in function in a reasonable and predictable amount of time.  Equipment Recommendations  Other (comment) (Rolling walker)    Recommendations for Other Services       Precautions / Restrictions Precautions Precautions: Fall;ICD/Pacemaker Precaution Comments: monitor O2 Restrictions Weight Bearing Restrictions: No      Mobility Bed Mobility               General bed mobility comments: received ambulating in hallway with RN    Transfers                   General transfer comment: received ambulating in hallway using Harmon Pier walker with RN      Balance Overall balance assessment: Needs assistance Sitting-balance support: Feet supported;No upper extremity supported Sitting balance-Leahy Scale: Good     Standing balance support: During functional activity;Bilateral upper extremity supported;Reliant on assistive device for balance Standing balance-Leahy Scale: Poor Standing balance comment: reliant on BUE support in standing                           ADL either performed or assessed with clinical judgement   ADL Overall ADL's : Needs assistance/impaired Eating/Feeding: Independent;Sitting   Grooming: Supervision/safety;Standing   Upper Body Bathing: Set up;Sitting   Lower Body Bathing: Moderate assistance;Sit to/from stand   Upper Body Dressing : Set up;Sitting   Lower Body Dressing: Moderate assistance;Sit to/from stand   Toilet Transfer: Min guard;Ambulation   Toileting- Clothing Manipulation and Hygiene: Moderate assistance;Sit to/from stand       Functional mobility during ADLs: Min guard General ADL Comments:  Pt with noted fatigue, new BUE reliance on DME for mobility.     Vision Baseline Vision/History: 1 Wears glasses Ability to See in Adequate Light: 0 Adequate Patient Visual Report: No change from  baseline Vision Assessment?: No apparent visual deficits     Perception     Praxis      Pertinent Vitals/Pain Pain Assessment: No/denies pain     Hand Dominance Right   Extremity/Trunk Assessment Upper Extremity Assessment Upper Extremity Assessment: LUE deficits/detail;RUE deficits/detail RUE Deficits / Details: edema noted LUE Deficits / Details: s/p pacemaker placement. WFL ROM; educated on precautions. edema noted L > R   Lower Extremity Assessment Lower Extremity Assessment: Defer to PT evaluation   Cervical / Trunk Assessment Cervical / Trunk Assessment: Kyphotic   Communication Communication Communication: HOH   Cognition Arousal/Alertness: Awake/alert Behavior During Therapy: WFL for tasks assessed/performed Overall Cognitive Status: Within Functional Limits for tasks assessed                                       General Comments  Wife present in room on entry. SpO2 88% on RA after received on walk with RN. Pt reports wearing O2 prior to initiating walk, so unsure if desatted more during mobility. With seated rest break, improved to 90% sustained. Educated if feeling SOB or noted desats (educa on optimal pleth wave), notify RN and replace [place].  HR 70s, BP WFL s/p walk    Exercises     Shoulder Instructions      Home Living Family/patient expects to be discharged to:: Private residence Living Arrangements: Spouse/significant other Available Help at Discharge: Available 24 hours/day;Family Type of Home: House Home Access: Stairs to enter [employer] of Steps: 3 Entrance Stairs-Rails: Right;Left;Can reach both Home Layout: One level;Able to live on main level with bedroom/bathroom     Bathroom Shower/Tub: Occupational psychologist: Handicapped height     Home Equipment: [place];Cane - single point;Shower seat          Prior Functioning/Environment Prior Level of Function : Independent/Modified  Independent             Mobility Comments: Modified independent, household ambulator uses furniture for support, uses cane for community distances ADLs Comments: able to complete ADLs, IADLs and yard work. 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)        OT Problem List: Decreased strength;Decreased activity tolerance;Impaired vision/perception;Decreased knowledge of precautions;Decreased knowledge of use of DME or AE      OT Treatment/Interventions: Self-care/ADL training;Therapeutic exercise;DME and/or AE instruction;Energy conservation;Therapeutic activities;Patient/family education    OT Goals(Current goals can be found in the care plan section) Acute Rehab OT Goals Patient Stated Goal: go to [place] [facility] rehab OT Goal Formulation: With patient/family Time For Goal Achievement: 05/09/21 Potential to Achieve Goals: Good ADL Goals Pt Will Perform Lower Body Bathing: with modified independence;sit to/from stand Pt Will Perform Lower Body Dressing: with modified independence;sit to/from stand Pt Will Transfer to Toilet: with modified independence;ambulating Pt Will Perform Toileting - Clothing Manipulation and hygiene: with modified independence;sit to/from stand;sitting/lateral leans Additional ADL Goal #1: Pt to increase standing tolerance during functional tasks > 10 min to improve overall endurance  OT Frequency: Min 2X/week   Barriers to D/C:            Co-evaluation  AM-PAC OT "6 Clicks" Daily Activity     Outcome Measure Help from another person eating meals?: None Help from another person taking care of personal grooming?: A Little Help from another person toileting, which includes using toliet, bedpan, or urinal?: A Lot Help from another person bathing (including washing, rinsing, drying)?: A Lot Help from another person to put on and taking off regular upper body clothing?: A Little Help from another person to put on  and taking off regular lower body clothing?: A Lot 6 Click Score: 16   End of Session Equipment Utilized During Treatment: Other (comment) Harmon Pier walker) Nurse Communication: Mobility status;Other (comment) (O2)  Activity Tolerance: Patient tolerated treatment well Patient left: in chair;with call bell/phone within reach;with family/visitor present  OT Visit Diagnosis: Unsteadiness on feet (R26.81);Other abnormalities of gait and mobility (R26.89);Muscle weakness (generalized) (M62.81)                Time: 2637-8588 OT Time Calculation (min): 22 min Charges:  OT General Charges $OT Visit: 1 Visit OT Evaluation $OT Eval Moderate Complexity: 1 Mod  [name], OTR/L Acute Rehab Services Office: [phone]   Layla Maw 04/25/2021, 9:31 AM

## 2021-04-25 NOTE — TOC Transition Note (Addendum)
Transition of Care [facility]) - CM/SW Discharge Note   Patient Details  Name: [name] MRN: [id number] Date of Birth: 10/12/1926  Transition of Care [facility]) CM/SW Contact:  Milas Gain, [place] Phone Number: 04/25/2021, 1:17 PM   Clinical Narrative:     Patient will DC to: [facility]  Anticipated DC date: 04/25/2021  Family notified: Nurse, mental health by: Corey Harold  ?  Per MD patient ready for DC to [facility] . Bryson Ha with [facility] confirmed no covid needed for patient to dc over.RN, patient, patient's family, and facility notified of DC. Discharge Summary sent to facility. RN given number for report tele# [phone] RM# [phone]. DC packet on chart. DNR signed by MD attached to patients DC packet.Ambulance transport requested for patient.  CSW signing off.    Final next level of care: Skilled Nursing Facility Barriers to Discharge: No Barriers Identified   Patient Goals and CMS Choice Patient states their goals for this hospitalization and ongoing recovery are:: SNF CMS Medicare.gov Compare Post Acute Care list provided to:: Patient Represenative (must comment) (Patients spouse Dalene Seltzer) Choice offered to / list presented to : Spouse (Patients spouse Dalene Seltzer)  Discharge Placement              Patient chooses bed at: [facility] Patient to be transferred to facility by: [place] Name of family member notified: Tim Patient and family notified of of transfer: 04/25/21  Discharge Plan and Services                                     Social Determinants of Health (SDOH) Interventions     Readmission Risk Interventions Readmission Risk Prevention Plan 04/24/2021 04/17/2021 04/13/2021  Transportation Screening Complete Complete Complete  PCP or Specialist Appt within 3-5 Days - - Complete  HRI or [place] - - Complete  Social Work Consult for [place] Planning/Counseling - - Complete  Palliative Care Screening - - Complete   Medication Review Press photographer) - Complete Complete  PCP or Specialist appointment within 3-5 days of discharge - Complete -  [place] or Home Care Consult Complete Complete -  SW Recovery Care/Counseling Consult Complete Complete -  Palliative Care Screening - Not Applicable -  [place] Complete Complete -  Some recent data might be hidden

## 2021-04-25 NOTE — NC FL2 (Signed)
[name] LEVEL OF CARE SCREENING TOOL     IDENTIFICATION  Patient Name: [name] Birthdate: [birth year] Sex: male Admission Date (Current Location): 04/12/2021  [place] and [place] Number:  Herbalist and Address:  [facility] [place] [address], [place], [place] [address]      Provider Number: [id number]  Attending Physician Name and Address:  Kipp Brood, MD  Relative Name and Phone Number:       Current Level of Care: Hospital Recommended Level of Care: [place] Prior Approval Number:    Date Approved/Denied: 04/14/21 PASRR Number: [id number]  Discharge Plan: SNF    Current Diagnoses: Patient Active Problem List   Diagnosis Date Noted   Pressure injury of skin 04/20/2021   Bradycardia 04/19/2021   Complete heart block ([place]) 04/18/2021   Acute combined systolic and diastolic congestive heart failure ([place])    Sepsis due to undetermined organism ([place]) 04/13/2021   Lobar pneumonia ([place]) 04/13/2021   Acute respiratory failure with hypoxia and hypercarbia ([place]) 04/13/2021   Acute on chronic congestive heart failure ([place]) 04/12/2021   Acute respiratory failure with hypoxia ([place]) 04/12/2021   Unstable angina ([place])    Family history of CABG    Chest pain 12/16/2020   Acute renal failure superimposed on stage 3 chronic kidney disease, unspecified acute renal failure type, unspecified whether stage 3a or 3b CKD ([place]) 11/07/2020   AKI (acute kidney injury) ([place]) 11/07/2020   Acute renal failure (ARF) ([place]) 11/07/2020   Gout involving toe 05/26/2019   Vitamin B 12 deficiency 07/27/2016   Myelodysplasia, low grade ([place]) 11/27/2015   Other primary cardiomyopathies 08/07/2011   Carotid stenosis 07/26/2011   Automatic implantable cardioverter-defibrillator in situ 07/26/2009   CAD, AUTOLOGOUS BYPASS GRAFT 08/24/2008   PROSTATE CANCER 08/03/2008   Other hyperlipidemia 08/03/2008   Essential hypertension  08/03/2008   MYOCARDIAL INFARCTION, HX OF 08/03/2008   Coronary artery disease 08/03/2008   Ischemic cardiomyopathy 29/92/4268   SYSTOLIC HEART FAILURE, CHRONIC 08/03/2008   NEPHROLITHIASIS, HX OF 08/03/2008   POSTSURGICAL AORTOCORONARY BYPASS STATUS 08/03/2008    Orientation RESPIRATION BLADDER Height & Weight     Self, Time, Situation, Place  Normal Incontinent Weight: 168 lb 6.9 oz (76.4 kg) Height:  5\' 4"  (162.6 cm)  BEHAVIORAL SYMPTOMS/MOOD NEUROLOGICAL BOWEL NUTRITION STATUS      Incontinent Diet (Diet Heart Room service appropriate? Yes; Fluid consistency: Thin)  AMBULATORY STATUS COMMUNICATION OF NEEDS Skin   Extensive Assist Verbally Other (Comment) (L arm tear)                       Personal Care Assistance Level of Assistance  Dressing, Feeding, Bathing Bathing Assistance: Maximum assistance Feeding assistance: Independent Dressing Assistance: Maximum assistance     Functional Limitations Info  Sight, Hearing, Speech Sight Info: Adequate Hearing Info: Adequate Speech Info: Adequate    SPECIAL CARE FACTORS FREQUENCY  PT (By licensed PT), OT (By licensed OT)     PT Frequency: 5x min weekly OT Frequency: 5x min weekly            Contractures Contractures Info: Not present    Additional Factors Info  Code Status, Allergies, Insulin Sliding Scale Code Status Info: Full Allergies Info: Codeine   Insulin Sliding Scale Info: insulin aspart (novoLOG) injection 0-15 Units 3 times daily with meals       Current Medications (04/25/2021):  This is the current hospital active medication list Current  Facility-Administered Medications  Medication Dose Route Frequency Provider Last Rate Last Admin   0.9 %  sodium chloride infusion  250 mL Intravenous Continuous Evans Lance, MD 10 mL/hr at 04/13/21 0915 250 mL at 04/13/21 0915   acetaminophen (TYLENOL) tablet 650 mg  650 mg Oral Q6H PRN Evans Lance, MD   650 mg at 04/19/21 2322   Or   acetaminophen  (TYLENOL) suppository 650 mg  650 mg Rectal Q6H PRN Evans Lance, MD   650 mg at 04/13/21 0339   acetaminophen (TYLENOL) tablet 325-650 mg  325-650 mg Oral Q4H PRN Evans Lance, MD       aspirin EC tablet 81 mg  81 mg Oral Daily Evans Lance, MD   81 mg at 04/25/21 1000   bisacodyl (DULCOLAX) suppository 10 mg  10 mg Rectal Daily PRN Evans Lance, MD   10 mg at 04/22/21 1113   carvedilol (COREG) tablet 12.5 mg  12.5 mg Oral BID WC Shirley Friar, PA-C   12.5 mg at 04/25/21 3818   Chlorhexidine Gluconate Cloth 2 % PADS 6 each  6 each Topical Daily Evans Lance, MD   6 each at 04/24/21 0700   docusate sodium (COLACE) capsule 200 mg  200 mg Oral Daily Evans Lance, MD   200 mg at 04/25/21 1000   feeding supplement (ENSURE ENLIVE / ENSURE PLUS) liquid 237 mL  237 mL Oral BID BM Evans Lance, MD   237 mL at 04/25/21 1001   fentaNYL (SUBLIMAZE) injection 12.5 mcg  12.5 mcg Intravenous Q1H PRN Evans Lance, MD   12.5 mcg at 04/21/21 2355   guaiFENesin-dextromethorphan (ROBITUSSIN DM) 100-10 MG/5ML syrup 5 mL  5 mL Oral Q4H PRN Evans Lance, MD   5 mL at 04/20/21 2050   insulin aspart (novoLOG) injection 0-15 Units  0-15 Units Subcutaneous TID WC Evans Lance, MD   3 Units at 04/22/21 1128   ipratropium-albuterol (DUONEB) 0.5-2.5 (3) MG/3ML nebulizer solution 3 mL  3 mL Nebulization Q4H PRN Evans Lance, MD   3 mL at 04/17/21 1329   MEDLINE mouth rinse  15 mL Mouth Rinse BID Evans Lance, MD   15 mL at 04/25/21 1001   melatonin tablet 5 mg  5 mg Oral QHS PRN Evans Lance, MD       ondansetron [facility]) injection 4 mg  4 mg Intravenous Q6H PRN Evans Lance, MD       polyethylene glycol (MIRALAX / Floria Raveling) packet 17 g  17 g Oral Daily Evans Lance, MD   17 g at 04/25/21 1000   simvastatin (ZOCOR) tablet 40 mg  40 mg Oral q1800 Evans Lance, MD   40 mg at 04/24/21 2001   sodium chloride flush (NS) 0.9 % injection 3 mL  3 mL Intravenous Q12H Evans Lance, MD   3 mL at 04/25/21 1001     Discharge Medications: Please see discharge summary for a list of discharge medications.  Relevant Imaging Results:  Relevant Lab Results:   Additional Information PT SSN [id number]  [name], LCSWA

## 2021-04-25 NOTE — Discharge Summary (Signed)
Physician Discharge Summary  Patient ID: PRYOR GUETTLER MRN: [id number] DOB/AGE: 1927-05-14 87 y.o.  Admit date: 04/12/2021 Discharge date: 04/25/2021  Admission Diagnoses: pneumonia - community acquired.   Discharge Diagnoses:  Principal Problem:   Acute on chronic congestive heart failure (HCC) Active Problems:   Essential hypertension   Coronary artery disease   Ischemic cardiomyopathy   SYSTOLIC HEART FAILURE, CHRONIC   Automatic implantable cardioverter-defibrillator in situ   Acute respiratory failure with hypoxia (HCC)   Sepsis due to undetermined organism ([place])   Lobar pneumonia (HCC)   Acute respiratory failure with hypoxia and hypercarbia (HCC)   Complete heart block (HCC)   Acute combined systolic and diastolic congestive heart failure (HCC)   Bradycardia   Pressure injury of skin   Discharged Condition: good  Hospital Course: 87 year old man who presented with shortness of breath and cough for 3 days. On admission found to have right lower lobe airspace disease and and elevated BNP at 1500. Respiratory status improved with diuresis and antibiotic treatment. Weaned of pressors but remained on dopamine for symptomatic bradycardia. He underwent placement of a Medtronic dual-chamber pacemaker with removal of the previously implanted single-chamber ICD in a patient with complete heart block. He was able to ambulate and perform basic activities of daily living but was too weak for a safe home discharge so he was sent to convalescent care in [place].   His spironolactone and losartan were held due to a creatinine elevation from baseline. Restart will be at the discretion of his PCP/community cardiologist.  Consults: cardiology   Treatments: Pacemaker generator change.    Discharge Exam: Blood pressure (!) 139/52, pulse 76, temperature 99.6 F (37.6 C), temperature source Oral, resp. rate 16, height 5\' 4"  (1.626 m), weight 76.4 kg, SpO2 91 %. General appearance: alert,  cooperative, and appears stated age Resp: clear to auscultation bilaterally Cardio: regular rate and rhythm, S1, S2 normal, no murmur, click, rub or gallop Extremities: extremities normal, atraumatic, no cyanosis or edema and intact pacemaker site Neurologic: Grossly normal able to walk with assistance  Disposition: Discharge disposition: 03-Skilled Nursing Facility      Discharge to [facility] in [place] for convalescent care.  Allergies as of 04/25/2021       Reactions   Codeine Nausea And Vomiting        Medication List     STOP taking these medications    losartan 50 MG tablet Commonly known as: COZAAR   spironolactone 25 MG tablet Commonly known as: ALDACTONE       TAKE these medications    acetaminophen 500 MG tablet Commonly known as: TYLENOL Take 1 tablet (500 mg total) by mouth every 6 (six) hours as needed for mild pain or fever.   aspirin 81 MG chewable tablet Chew 81 mg by mouth daily.   carvedilol 12.5 MG tablet Commonly known as: COREG Take 1 tablet (12.5 mg total) by mouth 2 (two) times daily with a meal.   cyanocobalamin 1000 MCG/ML injection Commonly known as: (VITAMIN B-12) ADMINISTER 1 ML IN THE MUSCLE 1 TIME AS DIRECTED What changed: See the new instructions.   diphenhydrAMINE 25 MG tablet Commonly known as: BENADRYL Take 25 mg by mouth at bedtime as needed for sleep.   nitroGLYCERIN 0.4 MG SL tablet Commonly known as: NITROSTAT Place 1 tablet (0.4 mg total) under the tongue every 5 (five) minutes as needed.   simvastatin 40 MG tablet Commonly known as: ZOCOR TAKE 1 TABLET BY MOUTH EVERY DAY  IN THE EVENING What changed:  how much to take how to take this when to take this   vitamin C 500 MG tablet Commonly known as: ASCORBIC ACID Take 500 mg by mouth daily.   Vitamin D3 25 MCG (1000 UT) Caps Take 1 capsule by mouth daily.        Contact information for follow-up providers     Shirley Friar, PA-C Follow up.    Specialty: Physician Assistant Why: on 12/23 at 1140 am for post pacemaker check Contact information: [place] [address] [phone]              Contact information for after-discharge care     [facility] .   Service: Skilled Nursing Contact information: [address] [place] [address] [phone]                    40 min were spend in preparation of this discharge.   SignedKipp Brood 04/25/2021, 11:08 AM

## 2021-04-25 NOTE — Progress Notes (Addendum)
Electrophysiology Rounding Note  Patient Name: [name] Date of Encounter: 04/25/2021  Primary Cardiologist: Rozann Lesches, MD Electrophysiologist: Dr. Lovena Le   Subjective   The patient is doing well today.  At this time, the patient denies chest pain, shortness of breath, or any new concerns.  Inpatient Medications    Scheduled Meds:  aspirin EC  81 mg Oral Daily   Chlorhexidine Gluconate Cloth  6 each Topical Daily   docusate sodium  200 mg Oral Daily   feeding supplement  237 mL Oral BID BM   insulin aspart  0-15 Units Subcutaneous TID WC   mouth rinse  15 mL Mouth Rinse BID   polyethylene glycol  17 g Oral Daily   simvastatin  40 mg Oral q1800   sodium chloride flush  3 mL Intravenous Q12H   Continuous Infusions:  sodium chloride 250 mL (04/13/21 0915)   PRN Meds: acetaminophen **OR** acetaminophen, acetaminophen, bisacodyl, fentaNYL (SUBLIMAZE) injection, guaiFENesin-dextromethorphan, ipratropium-albuterol, melatonin, ondansetron (ZOFRAN) IV   Vital Signs    Vitals:   04/25/21 0300 04/25/21 0400 04/25/21 0500 04/25/21 0600  BP: 140/80 (!) 161/61 (!) 158/58 136/81  Pulse: 72 88 70 77  Resp: (!) 24 (!) 25 15 20   Temp:      TempSrc:      SpO2: 90% 95% (!) 89% 92%  Weight:   76.4 kg   Height:        Intake/Output Summary (Last 24 hours) at 04/25/2021 0713 Last data filed at 04/25/2021 0400 Gross per 24 hour  Intake 628.56 ml  Output 1100 ml  Net -471.44 ml   Filed Weights   04/23/21 0646 04/24/21 0500 04/25/21 0500  Weight: 76.4 kg 74.7 kg 76.4 kg    Physical Exam    GEN- The patient is well appearing, alert and oriented x 3 today.   Head- normocephalic, atraumatic Eyes-  Sclera clear, conjunctiva pink Ears- hearing intact Oropharynx- clear Neck- supple Lungs- Clear to ausculation bilaterally, normal work of breathing Heart- Regular rate and rhythm, no murmurs, rubs or gallops GI- soft, NT, ND, + BS Extremities- no clubbing or  cyanosis. No edema Skin- no rash or lesion Psych- euthymic mood, full affect Neuro- strength and sensation are intact  Labs    CBC Recent Labs    04/24/21 0511 04/25/21 0550  WBC 8.3 6.9  NEUTROABS  --  3.4  HGB 11.8* 10.8*  HCT 36.8* 34.2*  MCV 90.2 91.2  PLT 190 161   Basic Metabolic Panel Recent Labs    04/24/21 0511 04/25/21 0550  NA 138 138  K 4.2 4.1  CL 105 106  CO2 25 25  GLUCOSE 107* 132*  BUN 53* 43*  CREATININE 2.31* 1.91*  CALCIUM 7.9* 7.8*  MG  --  1.8   Liver Function Tests No results for input(s): AST, ALT, ALKPHOS, BILITOT, PROT, ALBUMIN in the last 72 hours. No results for input(s): LIPASE, AMYLASE in the last 72 hours. Cardiac Enzymes No results for input(s): CKTOTAL, CKMB, CKMBINDEX, TROPONINI in the last 72 hours.   Telemetry    A sensed V Paced 70-90s (personally reviewed)  Radiology    EP PPM/ICD IMPLANT  Result Date: 04/24/2021 Conclusion: Successful insertion of a Med[employer]onic dual-chamber pacemaker with removal of the previously implanted single-chamber ICD in a patient with complete heart block. Cristopher Peru, MD  DG Chest Port 1 View  Result Date: 04/24/2021 CLINICAL DATA:  Pacemaker EXAM: PORTABLE CHEST 1 VIEW COMPARISON:  Chest x-ray dated April 23, 2021  FINDINGS: Cardiac and mediastinal contours are unchanged status post median sternotomy. Interval removal cardiac conduction device and placement of new AICD with leads projecting over the expected area of the right atrium and right ventricle. Two capped leads noted. Mild bilateral interstitial opacities, likely due to pulmonary edema. Possible layering right pleural effusion. Skinfold artifact noted over the right hemithorax. No evidence of pneumothorax. IMPRESSION: Interval placement of AICD with leads projecting over the expected area of the right atrium and right ventricle. Electronically Signed   By: Yetta Glassman M.D.   On: 04/24/2021 18:17   DG CHEST PORT 1 VIEW  Result  Date: 04/23/2021 CLINICAL DATA:  Hypertension. EXAM: PORTABLE CHEST 1 VIEW COMPARISON:  April 18, 2021. FINDINGS: Stable cardiomegaly. Left-sided defibrillator is unchanged in position. Status post coronary bypass graft. Right internal jugular catheter is unchanged. Left lung is clear. Mild right basilar atelectasis is noted with possible small right pleural effusion. Bony thorax is unremarkable. IMPRESSION: Mild right basilar atelectasis is noted with possible small right pleural effusion. Electronically Signed   By: Marijo Conception M.D.   On: 04/23/2021 08:38    Patient Profile     KELDON LASSEN is a 87 y.o. male with a hx of chronic systolic HF, CAD s/p prior CABG, prior ICD now nonfunctional because of depleted battery who was seen 04/22/2021 for the evaluation of complete heart block.  Assessment & Plan    Complete Heart Block S/p Gen change and upgrade to DDD device from previously abandoned single chamber ICD CXR last night looks OK.  Device interrogation pending.    2. Chronic systolic CHF EF 14-23% 01/5319 Resume BB   3. CAD Significant disease with patent grafts 12/19/2020 Medical management recommended  Wound care and arm restrictions personally reviewed with patient and his wife this am. Usual follow up in place.   EP will see as needed while remains here.   For questions or updates, please contact [place] Please consult [email or web address] for contact info under Cardiology/STEMI.  Signed, Shirley Friar, PA-C  04/25/2021, 7:13 AM   EP Attending  Patient seen and examined. Agree with the findings as noted above. The patient is doing well s/p PPM insertion. His PM interrogation under my direction demonstrates normal DDD PM function using the old rate/sense portion of the ICD lead. His CXR demonstrates normal DDD PM function. He can be discharged home with usual follouwup.   Carleene Overlie Laniyah Rosenwald,MD

## 2021-04-25 NOTE — Plan of Care (Signed)
Adequate for discharge to SNF.   Problem: Education: Goal: Knowledge of General Education information will improve Description: Including pain rating scale, medication(s)/side effects and non-pharmacologic comfort measures 04/25/2021 1245 by Osborne Oman, RN Outcome: Completed/Met 04/25/2021 1244 by Osborne Oman, RN Outcome: Adequate for Discharge   Problem: Health Behavior/Discharge Planning: Goal: Ability to manage health-related needs will improve 04/25/2021 1245 by Osborne Oman, RN Outcome: Completed/Met 04/25/2021 1244 by Osborne Oman, RN Outcome: Adequate for Discharge   Problem: Clinical Measurements: Goal: Ability to maintain clinical measurements within normal limits will improve 04/25/2021 1245 by Osborne Oman, RN Outcome: Completed/Met 04/25/2021 1244 by Osborne Oman, RN Outcome: Adequate for Discharge Goal: Will remain free from infection 04/25/2021 1245 by Osborne Oman, RN Outcome: Completed/Met 04/25/2021 1244 by Osborne Oman, RN Outcome: Adequate for Discharge Goal: Diagnostic test results will improve 04/25/2021 1245 by Osborne Oman, RN Outcome: Completed/Met 04/25/2021 1244 by Osborne Oman, RN Outcome: Adequate for Discharge Goal: Respiratory complications will improve 04/25/2021 1245 by Osborne Oman, RN Outcome: Completed/Met 04/25/2021 1244 by Osborne Oman, RN Outcome: Adequate for Discharge Goal: Cardiovascular complication will be avoided 04/25/2021 1245 by Osborne Oman, RN Outcome: Completed/Met 04/25/2021 1244 by Osborne Oman, RN Outcome: Adequate for Discharge   Problem: Activity: Goal: Risk for activity intolerance will decrease 04/25/2021 1245 by Osborne Oman, RN Outcome: Completed/Met 04/25/2021 1244 by Osborne Oman, RN Outcome: Adequate for Discharge   Problem: Nutrition: Goal: Adequate nutrition will be  maintained 04/25/2021 1245 by Osborne Oman, RN Outcome: Completed/Met 04/25/2021 1244 by Osborne Oman, RN Outcome: Adequate for Discharge   Problem: Coping: Goal: Level of anxiety will decrease 04/25/2021 1245 by Osborne Oman, RN Outcome: Completed/Met 04/25/2021 1244 by Osborne Oman, RN Outcome: Adequate for Discharge   Problem: Elimination: Goal: Will not experience complications related to bowel motility 04/25/2021 1245 by Osborne Oman, RN Outcome: Completed/Met 04/25/2021 1244 by Osborne Oman, RN Outcome: Adequate for Discharge Goal: Will not experience complications related to urinary retention 04/25/2021 1245 by Osborne Oman, RN Outcome: Completed/Met 04/25/2021 1244 by Osborne Oman, RN Outcome: Adequate for Discharge   Problem: Pain Managment: Goal: General experience of comfort will improve 04/25/2021 1245 by Osborne Oman, RN Outcome: Completed/Met 04/25/2021 1244 by Osborne Oman, RN Outcome: Adequate for Discharge   Problem: Safety: Goal: Ability to remain free from injury will improve 04/25/2021 1245 by Osborne Oman, RN Outcome: Completed/Met 04/25/2021 1244 by Osborne Oman, RN Outcome: Adequate for Discharge   Problem: Skin Integrity: Goal: Risk for impaired skin integrity will decrease 04/25/2021 1245 by Osborne Oman, RN Outcome: Completed/Met 04/25/2021 1244 by Osborne Oman, RN Outcome: Adequate for Discharge   Problem: Education: Goal: Ability to demonstrate management of disease process will improve 04/25/2021 1245 by Osborne Oman, RN Outcome: Completed/Met 04/25/2021 1244 by Osborne Oman, RN Outcome: Adequate for Discharge Goal: Ability to verbalize understanding of medication therapies will improve 04/25/2021 1245 by Osborne Oman, RN Outcome: Completed/Met 04/25/2021 1244 by Osborne Oman, RN Outcome:  Adequate for Discharge Goal: Individualized Educational Video(s) 04/25/2021 1245 by Osborne Oman, RN Outcome: Completed/Met 04/25/2021 1244 by Osborne Oman, RN Outcome: Adequate for Discharge   Problem: Activity: Goal: Capacity to carry out activities will improve 04/25/2021 1245 by Osborne Oman, RN Outcome: Completed/Met 04/25/2021 1244 by Osborne Oman, RN Outcome: Adequate for Discharge   Problem: Cardiac: Goal: Ability  to achieve and maintain adequate cardiopulmonary perfusion will improve 04/25/2021 1245 by Osborne Oman, RN Outcome: Completed/Met 04/25/2021 1244 by Osborne Oman, RN Outcome: Adequate for Discharge

## 2021-04-25 NOTE — Progress Notes (Signed)
0700 - Report received from PM RN.  All questions answered.  Safety checks performed.  Line verified. Hand hygiene performed before/after each pt contact. Blessing, PA rounded. 0800 - Assessment.  Dr. Lynetta Mare rounded. 0830 Jonni Sanger, PA and Dr. Lovena Le rounded. 0845 - Pt walked 270' with FWW.  Almyra Free, OT intercepted during walk and performed her assessment. 0930 - Pt's monitor changed. 1130 - AVS reviewed with pt/family.  All questions answered. 1240 - Second AVS printed for SNF, per family request. 60 - Dr. Lynetta Mare attempted to fill out MOST form with pt.  Pt was sleeping.  Dr. Lynetta Mare elected to return later. 1320 Ebony Hail, LCSWA requested Dr. Lynetta Mare sign pt's DNR form.  She was advised that Dr. Lynetta Mare elected to do a MOST.  She requested that he do both.  Dr. Lynetta Mare sent a secure chat advising Allison's request. 1400 - Pt elected to ambulate around the unit.  Pt able to walk 370' with FWW. 1415 - Pt reviewed, filled out, and signed MOST form with Dr. Lynetta Mare.  Dr. Lynetta Mare signed DNR.  He requested these forms be digitally scanned into the pt's chart.  Unit secretary advised that the fax machine on the unit can't do it.  I called Medical Records who were unable to assist.  Copies of each were made. 1710 - [facility] called to seek an ETA.  [facility] personnel advised that he was second on their list. 1715 - Pt's dinner arrived.  Pt declined to eat. [place] [name], [name], called and updated.  He was further advised that we would call him when pt leaves. 1900 - Report given to PM RN.  All questions answered.

## 2021-04-25 NOTE — Plan of Care (Signed)

## 2021-04-27 DIAGNOSIS — I10 Essential (primary) hypertension: Secondary | ICD-10-CM | POA: Diagnosis not present

## 2021-04-27 DIAGNOSIS — I2510 Atherosclerotic heart disease of native coronary artery without angina pectoris: Secondary | ICD-10-CM | POA: Diagnosis not present

## 2021-04-27 DIAGNOSIS — I5023 Acute on chronic systolic (congestive) heart failure: Secondary | ICD-10-CM | POA: Diagnosis not present

## 2021-04-27 DIAGNOSIS — R001 Bradycardia, unspecified: Secondary | ICD-10-CM | POA: Diagnosis not present

## 2021-04-27 DIAGNOSIS — J9600 Acute respiratory failure, unspecified whether with hypoxia or hypercapnia: Secondary | ICD-10-CM | POA: Diagnosis not present

## 2021-04-27 DIAGNOSIS — I442 Atrioventricular block, complete: Secondary | ICD-10-CM | POA: Diagnosis not present

## 2021-04-27 DIAGNOSIS — I255 Ischemic cardiomyopathy: Secondary | ICD-10-CM | POA: Diagnosis not present

## 2021-04-27 DIAGNOSIS — J189 Pneumonia, unspecified organism: Secondary | ICD-10-CM | POA: Diagnosis not present

## 2021-04-28 DIAGNOSIS — I5023 Acute on chronic systolic (congestive) heart failure: Secondary | ICD-10-CM | POA: Diagnosis not present

## 2021-05-02 DIAGNOSIS — R5381 Other malaise: Secondary | ICD-10-CM | POA: Diagnosis not present

## 2021-05-02 DIAGNOSIS — I5033 Acute on chronic diastolic (congestive) heart failure: Secondary | ICD-10-CM | POA: Diagnosis not present

## 2021-05-02 DIAGNOSIS — J189 Pneumonia, unspecified organism: Secondary | ICD-10-CM | POA: Diagnosis not present

## 2021-05-02 DIAGNOSIS — I10 Essential (primary) hypertension: Secondary | ICD-10-CM | POA: Diagnosis not present

## 2021-05-02 NOTE — Progress Notes (Signed)
Acute on chronic combined systolic and diastolic congestive heart failure

## 2021-05-04 DIAGNOSIS — I10 Essential (primary) hypertension: Secondary | ICD-10-CM | POA: Diagnosis not present

## 2021-05-04 DIAGNOSIS — R5381 Other malaise: Secondary | ICD-10-CM | POA: Diagnosis not present

## 2021-05-04 DIAGNOSIS — I5033 Acute on chronic diastolic (congestive) heart failure: Secondary | ICD-10-CM | POA: Diagnosis not present

## 2021-05-04 DIAGNOSIS — J189 Pneumonia, unspecified organism: Secondary | ICD-10-CM | POA: Diagnosis not present

## 2021-05-05 ENCOUNTER — Other Ambulatory Visit: Payer: Self-pay

## 2021-05-05 ENCOUNTER — Ambulatory Visit (INDEPENDENT_AMBULATORY_CARE_PROVIDER_SITE_OTHER): Payer: Medicare Other | Admitting: Student

## 2021-05-05 DIAGNOSIS — I442 Atrioventricular block, complete: Secondary | ICD-10-CM | POA: Diagnosis not present

## 2021-05-05 LAB — CUP PACEART INCLINIC DEVICE CHECK
Battery Remaining Longevity: 136 mo
Battery Voltage: 3.22 V
Brady Statistic AP VP Percent: 35.31 %
Brady Statistic AP VS Percent: 0.01 %
Brady Statistic AS VP Percent: 63.71 %
Brady Statistic AS VS Percent: 0.97 %
Brady Statistic RA Percent Paced: 34.87 %
Brady Statistic RV Percent Paced: 99.02 %
Date Time Interrogation Session: 20221223114101
Implantable Lead Implant Date: 20071018
Implantable Lead Implant Date: 20221212
Implantable Lead Location: 753859
Implantable Lead Location: 753860
Implantable Lead Model: 5076
Implantable Lead Model: 6947
Implantable Pulse Generator Implant Date: 20221212
Lead Channel Impedance Value: 266 Ohm
Lead Channel Impedance Value: 285 Ohm
Lead Channel Impedance Value: 342 Ohm
Lead Channel Impedance Value: 456 Ohm
Lead Channel Pacing Threshold Amplitude: 0.5 V
Lead Channel Pacing Threshold Amplitude: 0.75 V
Lead Channel Pacing Threshold Pulse Width: 0.4 ms
Lead Channel Pacing Threshold Pulse Width: 0.4 ms
Lead Channel Sensing Intrinsic Amplitude: 1.25 mV
Lead Channel Sensing Intrinsic Amplitude: 1.25 mV
Lead Channel Sensing Intrinsic Amplitude: 14 mV
Lead Channel Setting Pacing Amplitude: 2 V
Lead Channel Setting Pacing Amplitude: 3.5 V
Lead Channel Setting Pacing Pulse Width: 0.4 ms
Lead Channel Setting Sensing Sensitivity: 5.6 mV

## 2021-05-05 NOTE — Progress Notes (Signed)
Wound check appointment. Steri-strips removed. Wound without redness or edema. Incision edges approximated, wound well healed. Normal device function. Thresholds, sensing, and impedances consistent with implant measurements. Device programmed at 3.5V/auto capture programmed on for extra safety margin until 3 month visit. Histogram distribution appropriate for patient and level of activity. No mode switches or high ventricular rates noted. Patient educated about wound care, arm mobility, lifting restrictions. ROV in 3 months with Dr. Lovena Le.  Legrand Como [address] [place], PA-C  05/05/2021 11:42 AM

## 2021-05-05 NOTE — Patient Instructions (Signed)
Medication Instructions:  Your physician recommends that you continue on your current medications as directed. Please refer to the Current Medication list given to you today.  *If you need a refill on your cardiac medications before your next appointment, please call your pharmacy*   Lab Work: None If you have labs (blood work) drawn today and your tests are completely normal, you will receive your results only by: [place] (if you have MyChart) OR A paper copy in the mail If you have any lab test that is abnormal or we need to change your treatment, we will call you to review the results.   Follow-Up: At [facility], you and your health needs are our priority.  As part of our continuing mission to provide you with exceptional heart care, we have created designated Provider Care Teams.  These Care Teams include your primary Cardiologist (physician) and Advanced Practice Providers (APPs -  Physician Assistants and Nurse Practitioners) who all work together to provide you with the care you need, when you need it.  We recommend signing up for the patient portal called "MyChart".  Sign up information is provided on this After Visit Summary.  MyChart is used to connect with patients for Virtual Visits (Telemedicine).  Patients are able to view lab/test results, encounter notes, upcoming appointments, etc.  Non-urgent messages can be sent to your provider as well.   To learn more about what you can do with MyChart, go to [email or web address].    Your next appointment:   As scheduled

## 2021-05-11 DIAGNOSIS — N189 Chronic kidney disease, unspecified: Secondary | ICD-10-CM | POA: Diagnosis not present

## 2021-05-11 DIAGNOSIS — U071 COVID-19: Secondary | ICD-10-CM | POA: Diagnosis not present

## 2021-05-18 DIAGNOSIS — R059 Cough, unspecified: Secondary | ICD-10-CM | POA: Diagnosis not present

## 2021-05-18 DIAGNOSIS — U071 COVID-19: Secondary | ICD-10-CM | POA: Diagnosis not present

## 2021-05-22 DIAGNOSIS — E785 Hyperlipidemia, unspecified: Secondary | ICD-10-CM | POA: Diagnosis not present

## 2021-05-22 DIAGNOSIS — Z95810 Presence of automatic (implantable) cardiac defibrillator: Secondary | ICD-10-CM | POA: Diagnosis not present

## 2021-05-22 DIAGNOSIS — J9601 Acute respiratory failure with hypoxia: Secondary | ICD-10-CM | POA: Diagnosis not present

## 2021-05-22 DIAGNOSIS — I255 Ischemic cardiomyopathy: Secondary | ICD-10-CM | POA: Diagnosis not present

## 2021-05-22 DIAGNOSIS — D696 Thrombocytopenia, unspecified: Secondary | ICD-10-CM | POA: Diagnosis not present

## 2021-05-22 DIAGNOSIS — Z9181 History of falling: Secondary | ICD-10-CM | POA: Diagnosis not present

## 2021-05-22 DIAGNOSIS — I130 Hypertensive heart and chronic kidney disease with heart failure and stage 1 through stage 4 chronic kidney disease, or unspecified chronic kidney disease: Secondary | ICD-10-CM | POA: Diagnosis not present

## 2021-05-22 DIAGNOSIS — Z7952 Long term (current) use of systemic steroids: Secondary | ICD-10-CM | POA: Diagnosis not present

## 2021-05-22 DIAGNOSIS — N1831 Chronic kidney disease, stage 3a: Secondary | ICD-10-CM | POA: Diagnosis not present

## 2021-05-22 DIAGNOSIS — N400 Enlarged prostate without lower urinary tract symptoms: Secondary | ICD-10-CM | POA: Diagnosis not present

## 2021-05-22 DIAGNOSIS — I5042 Chronic combined systolic (congestive) and diastolic (congestive) heart failure: Secondary | ICD-10-CM | POA: Diagnosis not present

## 2021-05-22 DIAGNOSIS — I455 Other specified heart block: Secondary | ICD-10-CM | POA: Diagnosis not present

## 2021-05-22 DIAGNOSIS — J189 Pneumonia, unspecified organism: Secondary | ICD-10-CM | POA: Diagnosis not present

## 2021-05-22 DIAGNOSIS — D469 Myelodysplastic syndrome, unspecified: Secondary | ICD-10-CM | POA: Diagnosis not present

## 2021-05-22 DIAGNOSIS — I2510 Atherosclerotic heart disease of native coronary artery without angina pectoris: Secondary | ICD-10-CM | POA: Diagnosis not present

## 2021-05-22 DIAGNOSIS — R1312 Dysphagia, oropharyngeal phase: Secondary | ICD-10-CM | POA: Diagnosis not present

## 2021-05-22 DIAGNOSIS — Z7982 Long term (current) use of aspirin: Secondary | ICD-10-CM | POA: Diagnosis not present

## 2021-05-22 DIAGNOSIS — Z8546 Personal history of malignant neoplasm of prostate: Secondary | ICD-10-CM | POA: Diagnosis not present

## 2021-05-23 DIAGNOSIS — N189 Chronic kidney disease, unspecified: Secondary | ICD-10-CM | POA: Diagnosis not present

## 2021-05-24 DIAGNOSIS — J189 Pneumonia, unspecified organism: Secondary | ICD-10-CM | POA: Diagnosis not present

## 2021-05-24 DIAGNOSIS — I130 Hypertensive heart and chronic kidney disease with heart failure and stage 1 through stage 4 chronic kidney disease, or unspecified chronic kidney disease: Secondary | ICD-10-CM | POA: Diagnosis not present

## 2021-05-24 DIAGNOSIS — J9601 Acute respiratory failure with hypoxia: Secondary | ICD-10-CM | POA: Diagnosis not present

## 2021-05-24 DIAGNOSIS — I2510 Atherosclerotic heart disease of native coronary artery without angina pectoris: Secondary | ICD-10-CM | POA: Diagnosis not present

## 2021-05-24 DIAGNOSIS — N1831 Chronic kidney disease, stage 3a: Secondary | ICD-10-CM | POA: Diagnosis not present

## 2021-05-24 DIAGNOSIS — I5042 Chronic combined systolic (congestive) and diastolic (congestive) heart failure: Secondary | ICD-10-CM | POA: Diagnosis not present

## 2021-05-25 DIAGNOSIS — I130 Hypertensive heart and chronic kidney disease with heart failure and stage 1 through stage 4 chronic kidney disease, or unspecified chronic kidney disease: Secondary | ICD-10-CM | POA: Diagnosis not present

## 2021-05-25 DIAGNOSIS — J189 Pneumonia, unspecified organism: Secondary | ICD-10-CM | POA: Diagnosis not present

## 2021-05-25 DIAGNOSIS — N1831 Chronic kidney disease, stage 3a: Secondary | ICD-10-CM | POA: Diagnosis not present

## 2021-05-25 DIAGNOSIS — J9601 Acute respiratory failure with hypoxia: Secondary | ICD-10-CM | POA: Diagnosis not present

## 2021-05-25 DIAGNOSIS — I2510 Atherosclerotic heart disease of native coronary artery without angina pectoris: Secondary | ICD-10-CM | POA: Diagnosis not present

## 2021-05-25 DIAGNOSIS — I5042 Chronic combined systolic (congestive) and diastolic (congestive) heart failure: Secondary | ICD-10-CM | POA: Diagnosis not present

## 2021-05-29 DIAGNOSIS — J189 Pneumonia, unspecified organism: Secondary | ICD-10-CM | POA: Diagnosis not present

## 2021-05-29 DIAGNOSIS — I5042 Chronic combined systolic (congestive) and diastolic (congestive) heart failure: Secondary | ICD-10-CM | POA: Diagnosis not present

## 2021-05-29 DIAGNOSIS — J9601 Acute respiratory failure with hypoxia: Secondary | ICD-10-CM | POA: Diagnosis not present

## 2021-05-29 DIAGNOSIS — I2510 Atherosclerotic heart disease of native coronary artery without angina pectoris: Secondary | ICD-10-CM | POA: Diagnosis not present

## 2021-05-29 DIAGNOSIS — N1831 Chronic kidney disease, stage 3a: Secondary | ICD-10-CM | POA: Diagnosis not present

## 2021-05-29 DIAGNOSIS — I130 Hypertensive heart and chronic kidney disease with heart failure and stage 1 through stage 4 chronic kidney disease, or unspecified chronic kidney disease: Secondary | ICD-10-CM | POA: Diagnosis not present

## 2021-05-31 DIAGNOSIS — H6122 Impacted cerumen, left ear: Secondary | ICD-10-CM | POA: Diagnosis not present

## 2021-06-05 DIAGNOSIS — J189 Pneumonia, unspecified organism: Secondary | ICD-10-CM | POA: Diagnosis not present

## 2021-06-05 DIAGNOSIS — I5042 Chronic combined systolic (congestive) and diastolic (congestive) heart failure: Secondary | ICD-10-CM | POA: Diagnosis not present

## 2021-06-05 DIAGNOSIS — N1831 Chronic kidney disease, stage 3a: Secondary | ICD-10-CM | POA: Diagnosis not present

## 2021-06-05 DIAGNOSIS — I130 Hypertensive heart and chronic kidney disease with heart failure and stage 1 through stage 4 chronic kidney disease, or unspecified chronic kidney disease: Secondary | ICD-10-CM | POA: Diagnosis not present

## 2021-06-05 DIAGNOSIS — J9601 Acute respiratory failure with hypoxia: Secondary | ICD-10-CM | POA: Diagnosis not present

## 2021-06-05 DIAGNOSIS — I2510 Atherosclerotic heart disease of native coronary artery without angina pectoris: Secondary | ICD-10-CM | POA: Diagnosis not present

## 2021-06-21 DIAGNOSIS — Z95810 Presence of automatic (implantable) cardiac defibrillator: Secondary | ICD-10-CM | POA: Diagnosis not present

## 2021-06-21 DIAGNOSIS — D696 Thrombocytopenia, unspecified: Secondary | ICD-10-CM | POA: Diagnosis not present

## 2021-06-21 DIAGNOSIS — D469 Myelodysplastic syndrome, unspecified: Secondary | ICD-10-CM | POA: Diagnosis not present

## 2021-06-21 DIAGNOSIS — Z7952 Long term (current) use of systemic steroids: Secondary | ICD-10-CM | POA: Diagnosis not present

## 2021-06-21 DIAGNOSIS — J189 Pneumonia, unspecified organism: Secondary | ICD-10-CM | POA: Diagnosis not present

## 2021-06-21 DIAGNOSIS — I255 Ischemic cardiomyopathy: Secondary | ICD-10-CM | POA: Diagnosis not present

## 2021-06-21 DIAGNOSIS — E785 Hyperlipidemia, unspecified: Secondary | ICD-10-CM | POA: Diagnosis not present

## 2021-06-21 DIAGNOSIS — I455 Other specified heart block: Secondary | ICD-10-CM | POA: Diagnosis not present

## 2021-06-21 DIAGNOSIS — I2510 Atherosclerotic heart disease of native coronary artery without angina pectoris: Secondary | ICD-10-CM | POA: Diagnosis not present

## 2021-06-21 DIAGNOSIS — J9601 Acute respiratory failure with hypoxia: Secondary | ICD-10-CM | POA: Diagnosis not present

## 2021-06-21 DIAGNOSIS — Z9181 History of falling: Secondary | ICD-10-CM | POA: Diagnosis not present

## 2021-06-21 DIAGNOSIS — N1831 Chronic kidney disease, stage 3a: Secondary | ICD-10-CM | POA: Diagnosis not present

## 2021-06-21 DIAGNOSIS — I5042 Chronic combined systolic (congestive) and diastolic (congestive) heart failure: Secondary | ICD-10-CM | POA: Diagnosis not present

## 2021-06-21 DIAGNOSIS — R1312 Dysphagia, oropharyngeal phase: Secondary | ICD-10-CM | POA: Diagnosis not present

## 2021-06-21 DIAGNOSIS — Z8546 Personal history of malignant neoplasm of prostate: Secondary | ICD-10-CM | POA: Diagnosis not present

## 2021-06-21 DIAGNOSIS — N400 Enlarged prostate without lower urinary tract symptoms: Secondary | ICD-10-CM | POA: Diagnosis not present

## 2021-06-21 DIAGNOSIS — I130 Hypertensive heart and chronic kidney disease with heart failure and stage 1 through stage 4 chronic kidney disease, or unspecified chronic kidney disease: Secondary | ICD-10-CM | POA: Diagnosis not present

## 2021-06-21 DIAGNOSIS — Z7982 Long term (current) use of aspirin: Secondary | ICD-10-CM | POA: Diagnosis not present

## 2021-06-22 ENCOUNTER — Other Ambulatory Visit: Payer: Self-pay | Admitting: Student

## 2021-06-22 ENCOUNTER — Telehealth: Payer: Self-pay | Admitting: Cardiology

## 2021-06-22 ENCOUNTER — Telehealth: Payer: Self-pay | Admitting: *Deleted

## 2021-06-22 ENCOUNTER — Encounter: Payer: Self-pay | Admitting: *Deleted

## 2021-06-22 ENCOUNTER — Other Ambulatory Visit (HOSPITAL_COMMUNITY)
Admission: RE | Admit: 2021-06-22 | Discharge: 2021-06-22 | Disposition: A | Discharge status: Home / Self Care | Payer: Medicare Other | Source: Ambulatory Visit | Attending: Student | Admitting: Student

## 2021-06-22 DIAGNOSIS — I5022 Chronic systolic (congestive) heart failure: Secondary | ICD-10-CM

## 2021-06-22 DIAGNOSIS — Z79899 Other long term (current) drug therapy: Secondary | ICD-10-CM

## 2021-06-22 DIAGNOSIS — N1831 Chronic kidney disease, stage 3a: Secondary | ICD-10-CM | POA: Diagnosis not present

## 2021-06-22 DIAGNOSIS — R600 Localized edema: Secondary | ICD-10-CM | POA: Diagnosis not present

## 2021-06-22 LAB — BASIC METABOLIC PANEL
Anion gap: 7 (ref 5–15)
BUN: 20 mg/dL (ref 8–23)
CO2: 29 mmol/L (ref 22–32)
Calcium: 8.8 mg/dL — ABNORMAL LOW (ref 8.9–10.3)
Chloride: 102 mmol/L (ref 98–111)
Creatinine, Ser: 1.35 mg/dL — ABNORMAL HIGH (ref 0.61–1.24)
GFR, Estimated: 49 mL/min — ABNORMAL LOW (ref >60)
Glucose, Bld: 248 mg/dL — ABNORMAL HIGH (ref 70–99)
Potassium: 4 mmol/L (ref 3.5–5.1)
Sodium: 138 mmol/L (ref 135–145)

## 2021-06-22 LAB — CBC
HCT: 38.7 % — ABNORMAL LOW (ref 39.0–52.0)
Hemoglobin: 12.3 g/dL — ABNORMAL LOW (ref 13.0–17.0)
MCH: 31.3 pg (ref 26.0–34.0)
MCHC: 31.8 g/dL (ref 30.0–36.0)
MCV: 98.5 fL (ref 80.0–100.0)
Platelets: 139 10*3/uL — ABNORMAL LOW (ref 150–400)
RBC: 3.93 MIL/uL — ABNORMAL LOW (ref 4.22–5.81)
RDW: 15.4 % (ref 11.5–15.5)
WBC: 3.6 10*3/uL — ABNORMAL LOW (ref 4.0–10.5)
nRBC: 0 % (ref 0.0–0.2)

## 2021-06-22 LAB — BRAIN NATRIURETIC PEPTIDE: B Natriuretic Peptide: 2087 pg/mL — ABNORMAL HIGH (ref 0.0–100.0)

## 2021-06-22 MED ORDER — POTASSIUM CHLORIDE ER 10 MEQ PO TBCR: 10.0000 meq | EXTENDED_RELEASE_TABLET | Freq: Every day | ORAL | 0 refills | Status: DC

## 2021-06-22 MED ORDER — FUROSEMIDE 40 MG PO TABS: 40.0000 mg | ORAL_TABLET | Freq: Every day | ORAL | 0 refills | Status: DC

## 2021-06-22 NOTE — Telephone Encounter (Signed)
° °  The patient and his wife walked into the office today reporting worsening lower extremity edema. He denies any specific orthopnea or PND but does report more shortness of breath with exertion but felt this was possibly secondary to deconditioning. Weight below prior baseline but he did lose weight while at SNF. He has 2-3+ pitting edema on examination today and is not currently on a diuretic. He was previously on Losartan and Spironolactone in 04/2021 but held at the time of hospital discharge due to his AKI.  Will plan to start Lasix 40mg  daily (pending repeat labs he may need K+ supplementation). Will obtain a CBC, BMET and BNP. He already has follow-up with Dr. Domenic Polite on Monday and will keep that visit. May need referral to Nephrology pending repeat lab results and this was discussed with the patient and his wife today.   Signed, Erma Heritage, PA-C 06/22/2021, 11:55 AM Pager: [phone]

## 2021-06-22 NOTE — Telephone Encounter (Signed)
Patient informed and verbalized understanding of plan. Will pick up lab order at visit on Monday 06/26/21 Copy sent to PCP

## 2021-06-22 NOTE — Telephone Encounter (Signed)
-----   Message from Erma Heritage, [place] sent at 06/22/2021  4:17 PM EST ----- Please let the patient know his hemoglobin has improved from 10.8 to 12.3. His fluid level remains significantly elevated. Continue with plans to start Lasix as discussed today. Kidney function has improved since 04/2021 with creatinine down to 1.35. Would start K-dur 10 mEq daily for now. Keep follow-up next week and track daily weights in the interim. Would plan for a repeat BMET in 7-10 days.

## 2021-06-22 NOTE — Telephone Encounter (Signed)
Walked into office for increased swelling of both legs and feet that has gradually gotten worse per patient and wife. Weight today in office is 156.7 lbs. Does not weigh at home daily. Denies extra sodium or fluid intake. Reports a little SOB. Denies dizziness or chest pain. 3+BLE noted to legs and feet. Also swelling in lower part of thigh. No redness or weeping noted. Denies pain in legs. Medication reviewed  Has not seen PCP since [facility] discharge on 05/05/22. Reports last labs done while at [facility]. Says he has upcoming lab work to be done at [facility] on 07/24/2021. Says he does not see a nephrologist Discussed with Bernerd Pho PA and ordered bmet, cbc, bnp & start lasix 40 mg daily Will arrange appointment with Domenic Polite on Monday 06/26/2021 @9 :00 am  Patient and wife informed and verbalized understanding of plan.

## 2021-06-22 NOTE — Telephone Encounter (Signed)
Pt walked into clinic with his wife. Wife came and said she had called, and hit the prompt to be called back but, never got a call back.  She stated her husband's the pt [name] both of his legs are swollen and, it is swelling above the knee.

## 2021-06-25 ENCOUNTER — Encounter: Payer: Self-pay | Admitting: Cardiology

## 2021-06-25 NOTE — Progress Notes (Signed)
Cardiology Office Note  Date: 06/26/2021   ID: [name], DOB [birth year], MRN [id number]  PCP:  [name], MD  Cardiologist:  Rozann Lesches, MD Electrophysiologist:  Cristopher Peru, MD   Chief Complaint  Patient presents with   Cardiac follow-up    History of Present Illness: [name] is a 88 y.o. male last seen in November 2022 by Ms. Strader PA-C.  He is here today with his wife for follow-up visit.  Back at home having completed rehabilitation in the [facility].  He and his wife both had COVID-19 during this time.  He has lost about 15 pounds, reported poor appetite for a while, but now eating better.  Despite his weight loss, he has had worsening leg edema.  Recently started on Lasix 40 mg daily with potassium supplement.  He has no longer been on either losartan or Aldactone, not taking Zocor.  I reviewed his recent lab work.  He has a Medtronic dual-chamber pacemaker in place with history of complete heart block, followed by Dr. Lovena Le.  Underwent explantation of single-chamber ICD with placement of pacemaker in December 2022.  He is using a walker to ambulate, denies any recent falls.  No angina symptoms, no palpitations or sudden syncope.  Echocardiogram in August 2022 revealed LVEF approximately 40 to 45%.  Past Medical History:  Diagnosis Date   BPH (benign prostatic hypertrophy)    Chronic systolic heart failure ([place])    Complete heart block [facility])    Medtronic pacemaker 04/2021 - Dr. Lovena Le   Coronary artery disease    a. Multivessel status post CABG 8/06, LIMA-LAD, SVG-CFX, SVG-distal RCA b. 12/2020: cath showing 3/3 patent grafts with 80% stenosis along LPAV prior to bifurication and medical management recommended.   Essential hypertension    Hyperlipidemia    Ischemic cardiomyopathy    Myelodysplasia, low grade ([place]) 11/27/2015   Nephrolithiasis    Prostate cancer ([place])    Vitamin B 12 deficiency 07/27/2016    Past Surgical History:   Procedure Laterality Date   CARDIAC DEFIBRILLATOR PLACEMENT     COLONOSCOPY     COLONOSCOPY N/A 08/19/2013   Procedure: COLONOSCOPY;  Surgeon: Rogene Houston, MD;  [facility];  Service: Endoscopy;  Laterality: N/A;  73   CORONARY ARTERY BYPASS GRAFT     8/06 with left anterior descending artery, saphenous vein graft to circumflex, saphenous vein graft to distal right coronary artery   CYSTOSCOPY/URETEROSCOPY/HOLMIUM LASER/STENT PLACEMENT Right 11/07/2020   Procedure: CYSTOSCOPY/URETEROSCOPY/HOLMIUM LASER/STENT PLACEMENT;  Surgeon: Alexis Frock, MD;  [facility];  Service: Urology;  Laterality: Right;   HERNIA REPAIR     LEFT HEART CATH AND CORS/GRAFTS ANGIOGRAPHY N/A 12/19/2020   Procedure: LEFT HEART CATH AND CORS/GRAFTS ANGIOGRAPHY;  Surgeon: Leonie Man, MD;  Location: [place] CV LAB;  Service: Cardiovascular;  Laterality: N/A;   PACEMAKER IMPLANT N/A 04/24/2021   Procedure: PACEMAKER IMPLANT;  Surgeon: Evans Lance, MD;  Location: [place] CV LAB;  Service: Cardiovascular;  Laterality: N/A;   POLYPECTOMY     PROSTATE SURGERY     TEMPORARY PACEMAKER N/A 04/21/2021   Procedure: TEMPORARY PACEMAKER;  Surgeon: Martinique, Peter M, MD;  Location: [place] CV LAB;  Service: Cardiovascular;  Laterality: N/A;    Current Outpatient Medications  Medication Sig Dispense Refill   acetaminophen (TYLENOL) 500 MG tablet Take 1 tablet (500 mg total) by mouth every 6 (six) hours as needed for mild pain or fever. 30 tablet  0   aspirin 81 MG chewable tablet Chew 81 mg by mouth daily.     carvedilol (COREG) 12.5 MG tablet Take 1 tablet (12.5 mg total) by mouth 2 (two) times daily with a meal. 180 tablet 3   Cholecalciferol (VITAMIN D3) 1000 UNITS CAPS Take 1 capsule by mouth daily.     cyanocobalamin (,VITAMIN B-12,) 1000 MCG/ML injection ADMINISTER 1 ML IN THE MUSCLE 1 TIME AS DIRECTED (Patient taking differently: Inject 1,000 mcg into the muscle every 30 (thirty) days.) 1  mL 14   diphenhydrAMINE (BENADRYL) 25 MG tablet Take 25 mg by mouth at bedtime as needed for sleep.     furosemide (LASIX) 40 MG tablet Take 1 tablet (40 mg total) by mouth daily. 30 tablet 0   nitroGLYCERIN (NITROSTAT) 0.4 MG SL tablet Place 1 tablet (0.4 mg total) under the tongue every 5 (five) minutes as needed. 25 tablet 3   sacubitril-valsartan (ENTRESTO) 24-26 MG Take 1 tablet by mouth 2 (two) times daily. 60 tablet 6   vitamin C (ASCORBIC ACID) 500 MG tablet Take 500 mg by mouth daily.       potassium chloride (KLOR-CON) 10 MEQ tablet Take 1 tablet (10 mEq total) by mouth daily. (Patient not taking: Reported on 06/26/2021) 30 tablet 0   simvastatin (ZOCOR) 40 MG tablet TAKE 1 TABLET BY MOUTH EVERY DAY IN THE EVENING 90 tablet 2   No current facility-administered medications for this visit.   Allergies:  Codeine   ROS: No syncope, no orthopnea or PND.  Physical Exam: VS:  BP 138/76    Pulse 78    Ht 5\' 4"  (1.626 m)    Wt 153 lb 3.2 oz (69.5 kg)    SpO2 94%    BMI 26.30 kg/m , BMI Body mass index is 26.3 kg/m.  Wt Readings from Last 3 Encounters:  06/26/21 153 lb 3.2 oz (69.5 kg)  04/25/21 168 lb 6.9 oz (76.4 kg)  03/30/21 168 lb (76.2 kg)    General: Elderly male in no distress, using a walker. HEENT: Conjunctiva and lids normal, wearing a mask. Neck: Supple, no elevated JVP or carotid bruits, no thyromegaly. Lungs: Clear to auscultation, nonlabored breathing at rest. Cardiac: Regular rate and rhythm, no S3 or significant systolic murmur, no pericardial rub. Extremities: 2-3+ lower leg edema.  ECG:  An ECG dated 04/25/2021 was personally reviewed today and demonstrated:  Ventricular paced rhythm with atrial tracking including burst of apparent atrial tachycardia.  Recent Labwork: 04/14/2021: ALT 26; AST 33 04/22/2021: TSH 3.006 04/25/2021: Magnesium 1.8 06/22/2021: B Natriuretic Peptide 2,087.0; BUN 20; Creatinine, Ser 1.35; Hemoglobin 12.3; Platelets 139; Potassium 4.0;  Sodium 138     Component Value Date/Time   CHOL 125 12/17/2020 0137   TRIG 71 12/17/2020 0137   HDL 29 (L) 12/17/2020 0137   CHOLHDL 4.3 12/17/2020 0137   VLDL 14 12/17/2020 0137   LDLCALC 82 12/17/2020 0137   LDLCALC 78 06/09/2019 0808    Other Studies Reviewed Today:  Echocardiogram 12/17/2020:  1. Poor acoustic windows.   2. Poor acoustic windows limit study Endocardium is not seen in all  segments even with DEFINITY There appears to be hypokinesis of the basal  inferior, basal inferoseptal, the inferolaterall and apical walls OVerall  LVEF appears mildly depressed LVEF 40  to 45%, again EF difficult given poor image quality. . The left  ventricular internal cavity size was severely dilated. There is mild left  ventricular hypertrophy. Left ventricular diastolic parameters are  indeterminate.   3. Device lead seen in RV to distal apex. Right ventricular systolic  function is normal. The right ventricular size is normal. There is  moderately elevated pulmonary artery systolic pressure.   4. Left atrial size was severely dilated.   5. The mitral valve is normal in structure. Mild mitral valve  regurgitation.   6. The aortic valve is tricuspid. Aortic valve regurgitation is not  visualized. Mild to moderate aortic valve sclerosis/calcification is  present, without any evidence of aortic stenosis.   7. The inferior vena cava is dilated in size with <50% respiratory  variability, suggesting right atrial pressure of 15 mmHg.   Cardiac catheterization 12/19/2020:   Prox RCA to Dist RCA lesion is 100% stenosed with 100% stenosed side branch in RV Branch.   Mid LM to Prox LAD lesion is 60% stenosed.   Prox Cx to Mid Cx lesion is 100% stenosed.   Prox LAD to Mid LAD lesion is 80% stenosed with 95% stenosed side branch in 2nd Diag.   Mid LAD lesion is 100% stenosed with 75% stenosed side branch in 3rd Diag.   ** LPAV lesion is 80% stenosed. -Fills via retrograde flow from SVG-OM 2.  Not  approachable percutaneously.   LIMA-LAD and is normal in caliber.  The graft exhibits no disease.   SVG-2nd Mrg graft was visualized by angiography and is large.  The graft exhibits no disease.   SVG-dRCA and is normal in caliber.  The graft exhibits no disease.   ----------------------   LV end diastolic pressure is moderately elevated.   There is no aortic valve stenosis.   SUMMARY Severe native CAD: 50-60% LEFT MAIN,  100% flush ostial RCA occlusion, 100 % proximal LCx occlusion after small OM1/RI,  Diffuse 80% proximal-mid followed by 100% mid LAD occlusion 3 of 3 patent grafts: LIMA-LAD, SVG-OM 2, SVG-RCA. Likely culprit lesion is 80% stenosis in the distal AVG LCx prior to bifurcation into LPL 1 and LPL 2 -> this fills via retrograde flow from the SVG-OM 2.  Not approachable from percutaneous option. Moderately elevated LAP.   RECOMMENDATIONS Optimize medical management.  Assessment and Plan:  1.  HFrEF with ischemic cardiomyopathy, LVEF approximately 40 to 45% by assessment in August 2022.  He has peripheral edema despite overall weight loss over the last few months.  Recently started on Lasix with potassium supplement.  Continue Coreg, start Entresto 24/26 mg twice daily with repeat BMET in 7 to 10 days.  Expect some degree of continued renal insufficiency, however will try to optimize medications as long as we can.  2.  Multivessel CAD status post CABG in 2006 with 3 of 3 patent bypass grafts at angiography in August 2022, 80% stenosis of LPA V that was managed medically.  Does not report any active angina.  Continue aspirin, resume Zocor 40 mg daily.  3.  Complete heart block now with Medtronic pacemaker in place and follow-up by Dr. Lovena Le.  4.  CKD stage IIIb, most recent creatinine 1.35 with normal potassium.  Medication Adjustments/Labs and Tests Ordered: Current medicines are reviewed at length with the patient today.  Concerns regarding medicines are outlined above.    Tests Ordered: Orders Placed This Encounter  Procedures   Basic metabolic panel    Medication Changes: Meds ordered this encounter  Medications   simvastatin (ZOCOR) 40 MG tablet    Sig: TAKE 1 TABLET BY MOUTH EVERY DAY IN THE EVENING    Dispense:  90 tablet  Refill:  2    06/26/21 restart 40 mg qd   sacubitril-valsartan (ENTRESTO) 24-26 MG    Sig: Take 1 tablet by mouth 2 (two) times daily.    Dispense:  60 tablet    Refill:  6    Please Honor Card patient is presenting for Carmie Kanner: [id number]; Juanna Cao: [id number]; HDIXB: OHS; OERQ: [id number]    Disposition:  Follow up  in early March as scheduled.  Signed, Satira Sark, MD, [facility] 06/26/2021 9:37 AM    [place] at [place], [place], [place] [address] Phone: [phone]; Fax: [phone]

## 2021-06-26 ENCOUNTER — Encounter: Payer: Self-pay | Admitting: Cardiology

## 2021-06-26 ENCOUNTER — Ambulatory Visit (INDEPENDENT_AMBULATORY_CARE_PROVIDER_SITE_OTHER): Payer: Medicare Other | Admitting: Cardiology

## 2021-06-26 VITALS — BP 138/76 | HR 78 | Ht 64.0 in | Wt 153.2 lb

## 2021-06-26 DIAGNOSIS — N1832 Chronic kidney disease, stage 3b: Secondary | ICD-10-CM | POA: Diagnosis not present

## 2021-06-26 DIAGNOSIS — I25119 Atherosclerotic heart disease of native coronary artery with unspecified angina pectoris: Secondary | ICD-10-CM

## 2021-06-26 DIAGNOSIS — Z79899 Other long term (current) drug therapy: Secondary | ICD-10-CM | POA: Diagnosis not present

## 2021-06-26 DIAGNOSIS — I5020 Unspecified systolic (congestive) heart failure: Secondary | ICD-10-CM

## 2021-06-26 MED ORDER — SACUBITRIL-VALSARTAN 24-26 MG PO TABS: 1.0000 | ORAL_TABLET | Freq: Two times a day (BID) | ORAL | 6 refills | Status: DC

## 2021-06-26 MED ORDER — SIMVASTATIN 40 MG PO TABS: ORAL_TABLET | ORAL | 2 refills | Status: DC

## 2021-06-26 NOTE — Addendum Note (Signed)
Addended by: Barbarann Ehlers A on: 06/26/2021 09:44 AM   Modules accepted: Orders

## 2021-06-26 NOTE — Patient Instructions (Signed)
Medication Instructions: Re-start Zocor 40 mg every evening  START Entresto 24/26 mg twice a day, 30 day FREE trial sent with prescription      Labwork: BMET in 7-10 days   Testing/Procedures: None today  Follow-Up: Keep March follow up appointment  Any Other Special Instructions Will Be Listed Below (If Applicable).  If you need a refill on your cardiac medications before your next appointment, please call your pharmacy.

## 2021-07-10 DIAGNOSIS — Z20822 Contact with and (suspected) exposure to covid-19: Secondary | ICD-10-CM | POA: Diagnosis not present

## 2021-07-13 DIAGNOSIS — I2510 Atherosclerotic heart disease of native coronary artery without angina pectoris: Secondary | ICD-10-CM | POA: Diagnosis not present

## 2021-07-13 DIAGNOSIS — J9601 Acute respiratory failure with hypoxia: Secondary | ICD-10-CM | POA: Diagnosis not present

## 2021-07-13 DIAGNOSIS — I130 Hypertensive heart and chronic kidney disease with heart failure and stage 1 through stage 4 chronic kidney disease, or unspecified chronic kidney disease: Secondary | ICD-10-CM | POA: Diagnosis not present

## 2021-07-13 DIAGNOSIS — J189 Pneumonia, unspecified organism: Secondary | ICD-10-CM | POA: Diagnosis not present

## 2021-07-13 DIAGNOSIS — I5042 Chronic combined systolic (congestive) and diastolic (congestive) heart failure: Secondary | ICD-10-CM | POA: Diagnosis not present

## 2021-07-13 DIAGNOSIS — N1831 Chronic kidney disease, stage 3a: Secondary | ICD-10-CM | POA: Diagnosis not present

## 2021-07-17 NOTE — Progress Notes (Signed)
Cardiology Office Note  Date: 07/18/2021   ID: [name], DOB [birth year], MRN [id number]  PCP:  [name], MD  Cardiologist:  Rozann Lesches, MD Electrophysiologist:  Cristopher Peru, MD   Chief Complaint  Patient presents with   Cardiac follow-up    History of Present Illness: [name] is a 88 y.o. male last seen in February.  He is here with his wife for a follow-up visit.  States that he feels better overall, no major fluctuation in weight.  He has tolerated recent adjustments in his medications.  Just had lab work done this morning with results pending.  His last creatinine was 1.35 in February.  He has a Medtronic dual-chamber pacemaker in place with history of complete heart block, follows with Dr. Lovena Le.  This device was placed in December 2022 following explantation of a single-chamber ICD.   Past Medical History:  Diagnosis Date   BPH (benign prostatic hypertrophy)    Chronic systolic heart failure ([place])    Complete heart block [facility])    Medtronic pacemaker 04/2021 - Dr. Lovena Le   Coronary artery disease    a. Multivessel status post CABG 8/06, LIMA-LAD, SVG-CFX, SVG-distal RCA b. 12/2020: cath showing 3/3 patent grafts with 80% stenosis along LPAV prior to bifurication and medical management recommended.   Essential hypertension    Hyperlipidemia    Ischemic cardiomyopathy    Myelodysplasia, low grade ([place]) 11/27/2015   Nephrolithiasis    Prostate cancer ([place])    Vitamin B 12 deficiency 07/27/2016    Past Surgical History:  Procedure Laterality Date   CARDIAC DEFIBRILLATOR PLACEMENT     COLONOSCOPY     COLONOSCOPY N/A 08/19/2013   Procedure: COLONOSCOPY;  Surgeon: Rogene Houston, MD;  [facility];  Service: Endoscopy;  Laterality: N/A;  48   CORONARY ARTERY BYPASS GRAFT     8/06 with left anterior descending artery, saphenous vein graft to circumflex, saphenous vein graft to distal right coronary artery   CYSTOSCOPY/URETEROSCOPY/HOLMIUM  LASER/STENT PLACEMENT Right 11/07/2020   Procedure: CYSTOSCOPY/URETEROSCOPY/HOLMIUM LASER/STENT PLACEMENT;  Surgeon: Alexis Frock, MD;  [facility];  Service: Urology;  Laterality: Right;   HERNIA REPAIR     LEFT HEART CATH AND CORS/GRAFTS ANGIOGRAPHY N/A 12/19/2020   Procedure: LEFT HEART CATH AND CORS/GRAFTS ANGIOGRAPHY;  Surgeon: Leonie Man, MD;  Location: [place] CV LAB;  Service: Cardiovascular;  Laterality: N/A;   PACEMAKER IMPLANT N/A 04/24/2021   Procedure: PACEMAKER IMPLANT;  Surgeon: Evans Lance, MD;  Location: [place] [facility] LAB;  Service: Cardiovascular;  Laterality: N/A;   POLYPECTOMY     PROSTATE SURGERY     TEMPORARY PACEMAKER N/A 04/21/2021   Procedure: TEMPORARY PACEMAKER;  Surgeon: Martinique, Peter M, MD;  Location: [place] CV LAB;  Service: Cardiovascular;  Laterality: N/A;    Current Outpatient Medications  Medication Sig Dispense Refill   acetaminophen (TYLENOL) 500 MG tablet Take 1 tablet (500 mg total) by mouth every 6 (six) hours as needed for mild pain or fever. 30 tablet 0   aspirin EC 81 MG tablet Take 81 mg by mouth daily. Swallow whole.     carvedilol (COREG) 12.5 MG tablet Take 1 tablet (12.5 mg total) by mouth 2 (two) times daily with a meal. 180 tablet 3   Cholecalciferol (VITAMIN D3) 1000 UNITS CAPS Take 1 capsule by mouth daily.     diphenhydrAMINE (BENADRYL) 25 MG tablet Take 25 mg by mouth at bedtime as needed for sleep.  furosemide (LASIX) 40 MG tablet Take 1 tablet (40 mg total) by mouth daily. 30 tablet 0   nitroGLYCERIN (NITROSTAT) 0.4 MG SL tablet Place 1 tablet (0.4 mg total) under the tongue every 5 (five) minutes as needed. 25 tablet 3   potassium chloride (KLOR-CON) 10 MEQ tablet Take 1 tablet (10 mEq total) by mouth daily. 30 tablet 0   sacubitril-valsartan (ENTRESTO) 24-26 MG Take 1 tablet by mouth 2 (two) times daily. 60 tablet 6   simvastatin (ZOCOR) 40 MG tablet TAKE 1 TABLET BY MOUTH EVERY DAY IN THE EVENING 90 tablet 2    vitamin C (ASCORBIC ACID) 500 MG tablet Take 500 mg by mouth daily.       cyanocobalamin (,VITAMIN B-12,) 1000 MCG/ML injection ADMINISTER 1 ML IN THE MUSCLE 1 TIME AS DIRECTED (Patient not taking: Reported on 07/18/2021) 1 mL 14   No current facility-administered medications for this visit.   Allergies:  Codeine   ROS: 2+ leg swelling, no syncope.  Physical Exam: VS:  BP 118/78    Pulse 69    Ht '5\' 4"'$  (1.626 m)    Wt 155 lb 9.6 oz (70.6 kg)    SpO2 96%    BMI 26.71 kg/m , BMI Body mass index is 26.71 kg/m.  Wt Readings from Last 3 Encounters:  07/18/21 155 lb 9.6 oz (70.6 kg)  06/26/21 153 lb 3.2 oz (69.5 kg)  04/25/21 168 lb 6.9 oz (76.4 kg)    General: Elderly male, appears comfortable at rest.  Using a cane today. HEENT: Conjunctiva and lids normal, wearing a mask. Neck: Supple, no elevated JVP or carotid bruits, no thyromegaly. Lungs: Clear to auscultation, nonlabored breathing at rest. Cardiac: Regular rate and rhythm, no S3, 1/6 systolic murmur. Extremities: 2+ leg edema.  ECG:  An ECG dated 04/25/2021 was personally reviewed today and demonstrated:  Ventricular paced rhythm with atrial tracking including burst of apparent atrial tachycardia.  Recent Labwork: 04/14/2021: ALT 26; AST 33 04/22/2021: TSH 3.006 04/25/2021: Magnesium 1.8 06/22/2021: B Natriuretic Peptide 2,087.0; Hemoglobin 12.3; Platelets 139 07/18/2021: BUN 30; Creatinine, Ser 1.25; Potassium 4.4; Sodium 140     Component Value Date/Time   CHOL 125 12/17/2020 0137   TRIG 71 12/17/2020 0137   HDL 29 (L) 12/17/2020 0137   CHOLHDL 4.3 12/17/2020 0137   VLDL 14 12/17/2020 0137   LDLCALC 82 12/17/2020 0137   LDLCALC 78 06/09/2019 0808    Other Studies Reviewed Today:  Echocardiogram 12/17/2020:  1. Poor acoustic windows.   2. Poor acoustic windows limit study Endocardium is not seen in all  segments even with DEFINITY There appears to be hypokinesis of the basal  inferior, basal inferoseptal, the  inferolaterall and apical walls OVerall  LVEF appears mildly depressed LVEF 40  to 45%, again EF difficult given poor image quality. . The left  ventricular internal cavity size was severely dilated. There is mild left  ventricular hypertrophy. Left ventricular diastolic parameters are  indeterminate.   3. Device lead seen in RV to distal apex. Right ventricular systolic  function is normal. The right ventricular size is normal. There is  moderately elevated pulmonary artery systolic pressure.   4. Left atrial size was severely dilated.   5. The mitral valve is normal in structure. Mild mitral valve  regurgitation.   6. The aortic valve is tricuspid. Aortic valve regurgitation is not  visualized. Mild to moderate aortic valve sclerosis/calcification is  present, without any evidence of aortic stenosis.   7. The  inferior vena cava is dilated in size with <50% respiratory  variability, suggesting right atrial pressure of 15 mmHg.    Cardiac catheterization 12/19/2020:   Prox RCA to Dist RCA lesion is 100% stenosed with 100% stenosed side branch in RV Branch.   Mid LM to Prox LAD lesion is 60% stenosed.   Prox Cx to Mid Cx lesion is 100% stenosed.   Prox LAD to Mid LAD lesion is 80% stenosed with 95% stenosed side branch in 2nd Diag.   Mid LAD lesion is 100% stenosed with 75% stenosed side branch in 3rd Diag.   ** LPAV lesion is 80% stenosed. -Fills via retrograde flow from SVG-OM 2.  Not approachable percutaneously.   LIMA-LAD and is normal in caliber.  The graft exhibits no disease.   SVG-2nd Mrg graft was visualized by angiography and is large.  The graft exhibits no disease.   SVG-dRCA and is normal in caliber.  The graft exhibits no disease.   ----------------------   LV end diastolic pressure is moderately elevated.   There is no aortic valve stenosis.   SUMMARY Severe native CAD: 50-60% LEFT MAIN,  100% flush ostial RCA occlusion, 100 % proximal LCx occlusion after small OM1/RI,   Diffuse 80% proximal-mid followed by 100% mid LAD occlusion 3 of 3 patent grafts: LIMA-LAD, SVG-OM 2, SVG-RCA. Likely culprit lesion is 80% stenosis in the distal AVG LCx prior to bifurcation into LPL 1 and LPL 2 -> this fills via retrograde flow from the SVG-OM 2.  Not approachable from percutaneous option. Moderately elevated LAP.   RECOMMENDATIONS Optimize medical management.  Assessment and Plan:  1.  HFrEF with ischemic cardiomyopathy and LVEF 40 to 45%.  Current regimen includes Coreg, Entresto, Lasix, and potassium supplement.  Follow-up BMET results and consider addition of Aldactone next.  Otherwise continues conservative management.  2.  Multivessel CAD status post CABG in 2006 with patent bypass grafts at angiography in August 2022.  He does have distal circumflex disease that is being managed medically as it is not approachable by PCI.  Continue aspirin and Zocor.  3.  CKD stage IIIb, last creatinine 1.35 and potassium 4.0.  4.  Complete heart block with Medtronic pacemaker in place and follow-up by Dr. Lovena Le.  Medication Adjustments/Labs and Tests Ordered: Current medicines are reviewed at length with the patient today.  Concerns regarding medicines are outlined above.   Tests Ordered: No orders of the defined types were placed in this encounter.   Medication Changes: No orders of the defined types were placed in this encounter.   Disposition:  Follow up  6 weeks.  Signed, Satira Sark, MD, [facility] 07/18/2021 11:15 AM    [place] at [place]. [address] [place], [place] [address] Phone: [phone]; Fax: [phone]

## 2021-07-18 ENCOUNTER — Other Ambulatory Visit (HOSPITAL_COMMUNITY)
Admission: RE | Admit: 2021-07-18 | Discharge: 2021-07-18 | Disposition: A | Discharge status: Home / Self Care | Payer: Medicare Other | Source: Ambulatory Visit | Attending: Cardiology | Admitting: Cardiology

## 2021-07-18 ENCOUNTER — Telehealth: Payer: Self-pay

## 2021-07-18 ENCOUNTER — Encounter: Payer: Self-pay | Admitting: Cardiology

## 2021-07-18 ENCOUNTER — Ambulatory Visit (INDEPENDENT_AMBULATORY_CARE_PROVIDER_SITE_OTHER): Payer: Medicare Other | Admitting: Cardiology

## 2021-07-18 ENCOUNTER — Other Ambulatory Visit: Payer: Self-pay

## 2021-07-18 VITALS — BP 118/78 | HR 69 | Ht 64.0 in | Wt 155.6 lb

## 2021-07-18 DIAGNOSIS — N1831 Chronic kidney disease, stage 3a: Secondary | ICD-10-CM | POA: Diagnosis present

## 2021-07-18 DIAGNOSIS — I5022 Chronic systolic (congestive) heart failure: Secondary | ICD-10-CM

## 2021-07-18 DIAGNOSIS — I5020 Unspecified systolic (congestive) heart failure: Secondary | ICD-10-CM | POA: Diagnosis not present

## 2021-07-18 DIAGNOSIS — N1832 Chronic kidney disease, stage 3b: Secondary | ICD-10-CM

## 2021-07-18 DIAGNOSIS — I25119 Atherosclerotic heart disease of native coronary artery with unspecified angina pectoris: Secondary | ICD-10-CM | POA: Diagnosis not present

## 2021-07-18 DIAGNOSIS — Z79899 Other long term (current) drug therapy: Secondary | ICD-10-CM

## 2021-07-18 DIAGNOSIS — R600 Localized edema: Secondary | ICD-10-CM

## 2021-07-18 LAB — BASIC METABOLIC PANEL
Anion gap: 6 (ref 5–15)
BUN: 30 mg/dL — ABNORMAL HIGH (ref 8–23)
CO2: 28 mmol/L (ref 22–32)
Calcium: 8.7 mg/dL — ABNORMAL LOW (ref 8.9–10.3)
Chloride: 106 mmol/L (ref 98–111)
Creatinine, Ser: 1.25 mg/dL — ABNORMAL HIGH (ref 0.61–1.24)
GFR, Estimated: 54 mL/min — ABNORMAL LOW (ref >60)
Glucose, Bld: 135 mg/dL — ABNORMAL HIGH (ref 70–99)
Potassium: 4.4 mmol/L (ref 3.5–5.1)
Sodium: 140 mmol/L (ref 135–145)

## 2021-07-18 MED ORDER — SPIRONOLACTONE 25 MG PO TABS
12.5000 mg | ORAL_TABLET | Freq: Every day | ORAL | 3 refills | Status: DC
Start: 2021-07-18 — End: 2022-03-14

## 2021-07-18 NOTE — Patient Instructions (Signed)
Medication Instructions:  ?Your physician recommends that you continue on your current medications as directed. Please refer to the Current Medication list given to you today. ? ? ?Labwork: ?None today ? ?Testing/Procedures: ?None today ? ?Follow-Up: ?6 weeks ? ?Any Other Special Instructions Will Be Listed Below (If Applicable). ? ?If you need a refill on your cardiac medications before your next appointment, please call your pharmacy. ? ?

## 2021-07-18 NOTE — Telephone Encounter (Signed)
Patient asked that I leave lab results/med changes on home machine. Machine not set up, will call again later. ?

## 2021-07-18 NOTE — Telephone Encounter (Signed)
I spoke with patient and discussed labs and addition of aldactone 12.5 mg qd. ?

## 2021-07-18 NOTE — Telephone Encounter (Signed)
-----   Message from Satira Sark, MD sent at 07/18/2021 11:32 AM EST ----- ?Results reviewed.  Follow-up lab work shows normal potassium at 4.4 and stable creatinine at 1.25 following initiation of Entresto at last visit.  Please start Aldactone 12.5 mg daily.  Check BMET in 2 weeks. ?

## 2021-07-24 ENCOUNTER — Inpatient Hospital Stay (HOSPITAL_COMMUNITY): Payer: Medicare Other | Attending: Hematology

## 2021-07-24 DIAGNOSIS — E611 Iron deficiency: Secondary | ICD-10-CM | POA: Insufficient documentation

## 2021-07-24 DIAGNOSIS — D4620 Refractory anemia with excess of blasts, unspecified: Secondary | ICD-10-CM | POA: Insufficient documentation

## 2021-07-24 DIAGNOSIS — E538 Deficiency of other specified B group vitamins: Secondary | ICD-10-CM | POA: Diagnosis not present

## 2021-07-24 LAB — CBC WITH DIFFERENTIAL/PLATELET
Abs Immature Granulocytes: 0.02 10*3/uL (ref 0.00–0.07)
Basophils Absolute: 0 10*3/uL (ref 0.0–0.1)
Basophils Relative: 0 %
Eosinophils Absolute: 0 10*3/uL (ref 0.0–0.5)
Eosinophils Relative: 0 %
HCT: 38.7 % — ABNORMAL LOW (ref 39.0–52.0)
Hemoglobin: 12.1 g/dL — ABNORMAL LOW (ref 13.0–17.0)
Immature Granulocytes: 1 %
Lymphocytes Relative: 29 %
Lymphs Abs: 1.2 10*3/uL (ref 0.7–4.0)
MCH: 29.9 pg (ref 26.0–34.0)
MCHC: 31.3 g/dL (ref 30.0–36.0)
MCV: 95.6 fL (ref 80.0–100.0)
Monocytes Absolute: 1.3 10*3/uL — ABNORMAL HIGH (ref 0.1–1.0)
Monocytes Relative: 33 %
Neutro Abs: 1.5 10*3/uL — ABNORMAL LOW (ref 1.7–7.7)
Neutrophils Relative %: 37 %
Platelets: 133 10*3/uL — ABNORMAL LOW (ref 150–400)
RBC: 4.05 MIL/uL — ABNORMAL LOW (ref 4.22–5.81)
RDW: 13.4 % (ref 11.5–15.5)
WBC: 4 10*3/uL (ref 4.0–10.5)
nRBC: 0 % (ref 0.0–0.2)

## 2021-07-24 LAB — COMPREHENSIVE METABOLIC PANEL
ALT: 8 U/L (ref 0–44)
AST: 11 U/L — ABNORMAL LOW (ref 15–41)
Albumin: 3.1 g/dL — ABNORMAL LOW (ref 3.5–5.0)
Alkaline Phosphatase: 52 U/L (ref 38–126)
Anion gap: 8 (ref 5–15)
BUN: 25 mg/dL — ABNORMAL HIGH (ref 8–23)
CO2: 27 mmol/L (ref 22–32)
Calcium: 8.7 mg/dL — ABNORMAL LOW (ref 8.9–10.3)
Chloride: 105 mmol/L (ref 98–111)
Creatinine, Ser: 1.52 mg/dL — ABNORMAL HIGH (ref 0.61–1.24)
GFR, Estimated: 43 mL/min — ABNORMAL LOW (ref >60)
Glucose, Bld: 146 mg/dL — ABNORMAL HIGH (ref 70–99)
Potassium: 4.8 mmol/L (ref 3.5–5.1)
Sodium: 140 mmol/L (ref 135–145)
Total Bilirubin: 0.9 mg/dL (ref 0.3–1.2)
Total Protein: 6.3 g/dL — ABNORMAL LOW (ref 6.5–8.1)

## 2021-07-24 LAB — VITAMIN B12: Vitamin B-12: 265 pg/mL (ref 180–914)

## 2021-07-24 LAB — FERRITIN: Ferritin: 54 ng/mL (ref 24–336)

## 2021-07-24 LAB — IRON AND TIBC
Iron: 43 ug/dL — ABNORMAL LOW (ref 45–182)
Saturation Ratios: 15 % — ABNORMAL LOW (ref 17.9–39.5)
TIBC: 283 ug/dL (ref 250–450)
UIBC: 240 ug/dL

## 2021-07-25 ENCOUNTER — Encounter: Payer: Self-pay | Admitting: Internal Medicine

## 2021-07-25 ENCOUNTER — Ambulatory Visit (INDEPENDENT_AMBULATORY_CARE_PROVIDER_SITE_OTHER): Payer: Medicare Other | Admitting: Internal Medicine

## 2021-07-25 VITALS — BP 124/58 | HR 79 | Ht 64.0 in | Wt 154.0 lb

## 2021-07-25 DIAGNOSIS — I25119 Atherosclerotic heart disease of native coronary artery with unspecified angina pectoris: Secondary | ICD-10-CM | POA: Diagnosis not present

## 2021-07-25 DIAGNOSIS — I442 Atrioventricular block, complete: Secondary | ICD-10-CM | POA: Diagnosis not present

## 2021-07-25 NOTE — Progress Notes (Signed)
? ? ? ? ?HPI ?[name] returns after a long absence from our EP clinic. He has a h/o chb and is s/p PPM insertion. He denies chest pain or sob. He has developed chronic systolic heart failure but his symptoms are now class 2. He denies chest pain or sob. He has had some leg swelling.  ?Allergies  ?Allergen Reactions  ? Codeine Nausea And Vomiting  ? ? ? ?Current Outpatient Medications  ?Medication Sig Dispense Refill  ? acetaminophen (TYLENOL) 500 MG tablet Take 1 tablet (500 mg total) by mouth every 6 (six) hours as needed for mild pain or fever. 30 tablet 0  ? aspirin EC 81 MG tablet Take 81 mg by mouth daily. Swallow whole.    ? carvedilol (COREG) 12.5 MG tablet Take 1 tablet (12.5 mg total) by mouth 2 (two) times daily with a meal. 180 tablet 3  ? Cholecalciferol (VITAMIN D3) 1000 UNITS CAPS Take 1 capsule by mouth daily.    ? diphenhydrAMINE (BENADRYL) 25 MG tablet Take 25 mg by mouth at bedtime as needed for sleep.    ? furosemide (LASIX) 40 MG tablet Take 1 tablet (40 mg total) by mouth daily. 30 tablet 0  ? nitroGLYCERIN (NITROSTAT) 0.4 MG SL tablet Place 1 tablet (0.4 mg total) under the tongue every 5 (five) minutes as needed. 25 tablet 3  ? potassium chloride (KLOR-CON) 10 MEQ tablet Take 1 tablet (10 mEq total) by mouth daily. 30 tablet 0  ? sacubitril-valsartan (ENTRESTO) 24-26 MG Take 1 tablet by mouth 2 (two) times daily. 60 tablet 6  ? simvastatin (ZOCOR) 40 MG tablet TAKE 1 TABLET BY MOUTH EVERY DAY IN THE EVENING 90 tablet 2  ? spironolactone (ALDACTONE) 25 MG tablet Take 0.5 tablets (12.5 mg total) by mouth daily. 45 tablet 3  ? vitamin C (ASCORBIC ACID) 500 MG tablet Take 500 mg by mouth daily.      ? cyanocobalamin (,VITAMIN B-12,) 1000 MCG/ML injection ADMINISTER 1 ML IN THE MUSCLE 1 TIME AS DIRECTED (Patient not taking: Reported on 07/18/2021) 1 mL 14  ? ?No current facility-administered medications for this visit.  ? ? ? ?Past Medical History:  ?Diagnosis Date  ? BPH (benign prostatic  hypertrophy)   ? Chronic systolic heart failure ([place])   ? Complete heart block ([place])   ? Medtronic pacemaker 04/2021 - Dr. Lovena Le  ? Coronary artery disease   ? a. Multivessel status post CABG 8/06, LIMA-LAD, SVG-CFX, SVG-distal RCA b. 12/2020: cath showing 3/3 patent grafts with 80% stenosis along LPAV prior to bifurication and medical management recommended.  ? Essential hypertension   ? Hyperlipidemia   ? Ischemic cardiomyopathy   ? Myelodysplasia, low grade ([place]) 11/27/2015  ? Nephrolithiasis   ? Prostate cancer ([place])   ? Vitamin B 12 deficiency 07/27/2016  ? ? ?ROS: ? ? All systems reviewed and negative except as noted in the HPI. ? ? ?Past Surgical History:  ?Procedure Laterality Date  ? CARDIAC DEFIBRILLATOR PLACEMENT    ? COLONOSCOPY    ? COLONOSCOPY N/A 08/19/2013  ? Procedure: COLONOSCOPY;  Surgeon: Rogene Houston, MD;  [facility];  Service: Endoscopy;  Laterality: N/A;  930  ? CORONARY ARTERY BYPASS GRAFT    ? 8/06 with left anterior descending artery, saphenous vein graft to circumflex, saphenous vein graft to distal right coronary artery  ? CYSTOSCOPY/URETEROSCOPY/HOLMIUM LASER/STENT PLACEMENT Right 11/07/2020  ? Procedure: CYSTOSCOPY/URETEROSCOPY/HOLMIUM LASER/STENT PLACEMENT;  Surgeon: Alexis Frock, MD;  [facility];  Service:  Urology;  Laterality: Right;  ? HERNIA REPAIR    ? LEFT HEART CATH AND CORS/GRAFTS ANGIOGRAPHY N/A 12/19/2020  ? Procedure: LEFT HEART CATH AND CORS/GRAFTS ANGIOGRAPHY;  Surgeon: Leonie Man, MD;  Location: [facility];  Service: Cardiovascular;  Laterality: N/A;  ? PACEMAKER IMPLANT N/A 04/24/2021  ? Procedure: PACEMAKER IMPLANT;  Surgeon: Evans Lance, MD;  Location: [facility];  Service: Cardiovascular;  Laterality: N/A;  ? POLYPECTOMY    ? PROSTATE SURGERY    ? TEMPORARY PACEMAKER N/A 04/21/2021  ? Procedure: TEMPORARY PACEMAKER;  Surgeon: Martinique, Peter M, MD;  Location: [facility];  Service: Cardiovascular;  Laterality: N/A;   ? ? ? ?Family History  ?Problem Relation Age of Onset  ? Colon cancer Father   ? Coronary artery disease Other   ? ? ? ?Social History  ? ?Socioeconomic History  ? Marital status: Married  ?  Spouse name: Not on file  ? Number of children: Not on file  ? Years of education: Not on file  ? Highest education level: Not on file  ?Occupational History  ? Occupation: Retired  ?  Employer: RETIRED  ?Tobacco Use  ? Smoking status: Former  ?  Packs/day: 0.25  ?  Years: 44.00  ?  Pack years: 11.00  ?  Types: Cigarettes  ?  Quit date: 05/14/1992  ?  Years since quitting: 29.2  ? Smokeless tobacco: Never  ?Vaping Use  ? Vaping Use: Never used  ?Substance and Sexual Activity  ? Alcohol use: No  ? Drug use: No  ? Sexual activity: Not on file  ?  Comment: married 63 years  ?Other Topics Concern  ? Not on file  ?Social History Narrative  ? Not on file  ? ?Social Determinants of Health  ? ?Financial Resource Strain: Not on file  ?Food Insecurity: Not on file  ?Transportation Needs: Not on file  ?Physical Activity: Not on file  ?Stress: Not on file  ?Social Connections: Not on file  ?Intimate Partner Violence: Not on file  ? ? ? ?BP (!) 124/58   Pulse 79   Ht '5\' 4"'$  (1.626 m)   Wt 154 lb (69.9 kg)   SpO2 98%   BMI 26.43 kg/m?  ? ?Physical Exam: ? ?Well appearing NAD ?HEENT: Unremarkable ?Neck:  No JVD, no thyromegally ?Lymphatics:  No adenopathy ?Back:  No CVA tenderness ?Lungs:  Clear with no wheezes ?HEART:  Regular rate rhythm, no murmurs, no rubs, no clicks ?Abd:  soft, positive bowel sounds, no organomegally, no rebound, no guarding ?Ext:  2 plus pulses, no edema, no cyanosis, no clubbing ?Skin:  No rashes no nodules ?Neuro:  CN II through XII intact, motor grossly intact ? ?DEVICE  ?Normal device function.  See PaceArt for details.  ? ?Assess/Plan:  ?CHB - he is asymptomatic s/p PPM insertion. ?PPM - his medtronic device is working normally. He is s/p gen change.  ?Chronic systolic heart failure - His EF was 40-45% by echo.  He will continue medical therapy. ? ?Luke Overlie Avea Mcgowen,MD ?

## 2021-07-25 NOTE — Patient Instructions (Signed)
Medication Instructions:  Your physician recommends that you continue on your current medications as directed. Please refer to the Current Medication list given to you today.  *If you need a refill on your cardiac medications before your next appointment, please call your pharmacy*   Lab Work: NONE   If you have labs (blood work) drawn today and your tests are completely normal, you will receive your results only by: . MyChart Message (if you have MyChart) OR . A paper copy in the mail If you have any lab test that is abnormal or we need to change your treatment, we will call you to review the results.   Testing/Procedures: NONE    Follow-Up: At [facility], you and your health needs are our priority.  As part of our continuing mission to provide you with exceptional heart care, we have created designated Provider Care Teams.  These Care Teams include your primary Cardiologist (physician) and Advanced Practice Providers (APPs -  Physician Assistants and Nurse Practitioners) who all work together to provide you with the care you need, when you need it.  We recommend signing up for the patient portal called "MyChart".  Sign up information is provided on this After Visit Summary.  MyChart is used to connect with patients for Virtual Visits (Telemedicine).  Patients are able to view lab/test results, encounter notes, upcoming appointments, etc.  Non-urgent messages can be sent to your provider as well.   To learn more about what you can do with MyChart, go to [email or web address].    Your next appointment:   1 year(s)  The format for your next appointment:   In Person  Provider:   Gregg Taylor, MD   Other Instructions Thank you for choosing [facility]!    

## 2021-07-26 LAB — METHYLMALONIC ACID, SERUM: Methylmalonic Acid, Quantitative: 200 nmol/L (ref 0–378)

## 2021-07-27 ENCOUNTER — Ambulatory Visit (INDEPENDENT_AMBULATORY_CARE_PROVIDER_SITE_OTHER): Payer: Medicare Other

## 2021-07-27 DIAGNOSIS — I442 Atrioventricular block, complete: Secondary | ICD-10-CM | POA: Diagnosis not present

## 2021-07-27 LAB — CUP PACEART REMOTE DEVICE CHECK
Battery Remaining Longevity: 121 mo
Battery Voltage: 3.17 V
Brady Statistic AP VP Percent: 54.4 %
Brady Statistic AP VS Percent: 0 %
Brady Statistic AS VP Percent: 44.76 %
Brady Statistic AS VS Percent: 0.84 %
Brady Statistic RA Percent Paced: 53.85 %
Brady Statistic RV Percent Paced: 99.16 %
Date Time Interrogation Session: 20230316055322
Implantable Lead Implant Date: 20071018
Implantable Lead Implant Date: 20221212
Implantable Lead Location: 753859
Implantable Lead Location: 753860
Implantable Lead Model: 5076
Implantable Lead Model: 6947
Implantable Pulse Generator Implant Date: 20221212
Lead Channel Impedance Value: 228 Ohm
Lead Channel Impedance Value: 266 Ohm
Lead Channel Impedance Value: 323 Ohm
Lead Channel Impedance Value: 418 Ohm
Lead Channel Pacing Threshold Amplitude: 0.5 V
Lead Channel Pacing Threshold Amplitude: 0.625 V
Lead Channel Pacing Threshold Pulse Width: 0.4 ms
Lead Channel Pacing Threshold Pulse Width: 0.4 ms
Lead Channel Sensing Intrinsic Amplitude: 0.75 mV
Lead Channel Sensing Intrinsic Amplitude: 0.75 mV
Lead Channel Sensing Intrinsic Amplitude: 14.75 mV
Lead Channel Sensing Intrinsic Amplitude: 14.75 mV
Lead Channel Setting Pacing Amplitude: 2 V
Lead Channel Setting Pacing Amplitude: 2.5 V
Lead Channel Setting Pacing Pulse Width: 0.4 ms
Lead Channel Setting Sensing Sensitivity: 5.6 mV

## 2021-07-28 ENCOUNTER — Other Ambulatory Visit: Payer: Self-pay | Admitting: Student

## 2021-07-29 NOTE — Progress Notes (Signed)
? ?[facility] ?[address] ?[place], [place] [address] ? ? ?CLINIC:  ?Medical Oncology/Hematology ? ?PCP:  ?[name], MD ?[address] ?[place] [address] ?[phone] ? ? ?REASON FOR VISIT:  ?Follow-up for low-grade MDS & vitamin B12 deficiency ?  ?PRIOR THERAPY: None ?  ?CURRENT THERAPY: Monthly B12 injection ? ?INTERVAL HISTORY:  ?[name] 88 y.o. male returns for routine follow-up of his low-grade MDS.  He was last seen by Tarri Abernethy PA-C on 01/17/2021. ? ?At today's visit, he reports feeling fair.  He reports that he was hospitalized in December 2022 for pneumonia and "heart issues."  He has been doing well since that time. ? ? [name] reports that he has "pretty good" energy, although he does tire easier than he used to. ? He denies any recent chest pain, dyspnea on exertion, syncope, palpitations, or dizziness. ? He admits to easy bruising, but denies petechial rash or spontaneous bleeding. ? No hematuria, hemoptysis, hematochezia, melena, or epistaxis. ? No recent infections or B symptoms such as fever, chills, night sweats, unintentional weight loss. ? ?He reports that his vitamin B12 injections and iron tablets were stopped by his primary care provider and that he has not taken them in several months.  He did try taking iron tablets after his last appointment with [place], but experienced significant constipation. ? ?He has 75% energy and 100% appetite. He lost about 15 pounds during his hospital stay in December 2022, but his weight has been stable since that time. ? ? ?REVIEW OF SYSTEMS:  ?Review of Systems  ?Constitutional:  Positive for fatigue. Negative for appetite change, chills, diaphoresis, fever and unexpected weight change.  ?HENT:   Negative for lump/mass and nosebleeds.   ?Eyes:  Negative for eye problems.  ?Respiratory:  Negative for cough, hemoptysis and shortness of breath.   ?Cardiovascular:  Negative for chest pain, leg swelling and palpitations.   ?Gastrointestinal:  Negative for abdominal pain, blood in stool, constipation, diarrhea, nausea and vomiting.  ?Genitourinary:  Negative for hematuria.   ?Skin: Negative.   ?Neurological:  Negative for dizziness, headaches and light-headedness.  ?Hematological:  Does not bruise/bleed easily.   ? ? ?PAST MEDICAL/SURGICAL HISTORY:  ?Past Medical History:  ?Diagnosis Date  ? BPH (benign prostatic hypertrophy)   ? Chronic systolic heart failure ([place])   ? Complete heart block ([place])   ? Medtronic pacemaker 04/2021 - Dr. Lovena Le  ? Coronary artery disease   ? a. Multivessel status post CABG 8/06, LIMA-LAD, SVG-CFX, SVG-distal RCA b. 12/2020: cath showing 3/3 patent grafts with 80% stenosis along LPAV prior to bifurication and medical management recommended.  ? Essential hypertension   ? Hyperlipidemia   ? Ischemic cardiomyopathy   ? Myelodysplasia, low grade ([place]) 11/27/2015  ? Nephrolithiasis   ? Prostate cancer ([place])   ? Vitamin B 12 deficiency 07/27/2016  ? ?Past Surgical History:  ?Procedure Laterality Date  ? CARDIAC DEFIBRILLATOR PLACEMENT    ? COLONOSCOPY    ? COLONOSCOPY N/A 08/19/2013  ? Procedure: COLONOSCOPY;  Surgeon: Rogene Houston, MD;  [facility];  Service: Endoscopy;  Laterality: N/A;  930  ? CORONARY ARTERY BYPASS GRAFT    ? 8/06 with left anterior descending artery, saphenous vein graft to circumflex, saphenous vein graft to distal right coronary artery  ? CYSTOSCOPY/URETEROSCOPY/HOLMIUM LASER/STENT PLACEMENT Right 11/07/2020  ? Procedure: CYSTOSCOPY/URETEROSCOPY/HOLMIUM LASER/STENT PLACEMENT;  Surgeon: Alexis Frock, MD;  [facility];  Service: Urology;  Laterality: Right;  ?  HERNIA REPAIR    ? LEFT HEART CATH AND CORS/GRAFTS ANGIOGRAPHY N/A 12/19/2020  ? Procedure: LEFT HEART CATH AND CORS/GRAFTS ANGIOGRAPHY;  Surgeon: Leonie Man, MD;  Location: [facility];  Service: Cardiovascular;  Laterality: N/A;  ? PACEMAKER IMPLANT N/A 04/24/2021  ? Procedure: PACEMAKER IMPLANT;   Surgeon: Evans Lance, MD;  Location: [facility];  Service: Cardiovascular;  Laterality: N/A;  ? POLYPECTOMY    ? PROSTATE SURGERY    ? TEMPORARY PACEMAKER N/A 04/21/2021  ? Procedure: TEMPORARY PACEMAKER;  Surgeon: Martinique, Peter M, MD;  Location: [facility];  Service: Cardiovascular;  Laterality: N/A;  ? ? ? ?SOCIAL HISTORY:  ?Social History  ? ?Socioeconomic History  ? Marital status: Married  ?  Spouse name: Not on file  ? Number of children: Not on file  ? Years of education: Not on file  ? Highest education level: Not on file  ?Occupational History  ? Occupation: Retired  ?  Employer: RETIRED  ?Tobacco Use  ? Smoking status: Former  ?  Packs/day: 0.25  ?  Years: 44.00  ?  Pack years: 11.00  ?  Types: Cigarettes  ?  Quit date: 05/14/1992  ?  Years since quitting: 29.2  ? Smokeless tobacco: Never  ?Vaping Use  ? Vaping Use: Never used  ?Substance and Sexual Activity  ? Alcohol use: No  ? Drug use: No  ? Sexual activity: Not on file  ?  Comment: married 63 years  ?Other Topics Concern  ? Not on file  ?Social History Narrative  ? Not on file  ? ?Social Determinants of Health  ? ?Financial Resource Strain: Not on file  ?Food Insecurity: Not on file  ?Transportation Needs: Not on file  ?Physical Activity: Not on file  ?Stress: Not on file  ?Social Connections: Not on file  ?Intimate Partner Violence: Not on file  ? ? ?FAMILY HISTORY:  ?Family History  ?Problem Relation Age of Onset  ? Colon cancer Father   ? Coronary artery disease Other   ? ? ?CURRENT MEDICATIONS:  ?Outpatient Encounter Medications as of 07/31/2021  ?Medication Sig  ? acetaminophen (TYLENOL) 500 MG tablet Take 1 tablet (500 mg total) by mouth every 6 (six) hours as needed for mild pain or fever.  ? aspirin EC 81 MG tablet Take 81 mg by mouth daily. Swallow whole.  ? carvedilol (COREG) 12.5 MG tablet Take 1 tablet (12.5 mg total) by mouth 2 (two) times daily with a meal.  ? Cholecalciferol (VITAMIN D3) 1000 UNITS CAPS Take 1 capsule by  mouth daily.  ? cyanocobalamin (,VITAMIN B-12,) 1000 MCG/ML injection ADMINISTER 1 ML IN THE MUSCLE 1 TIME AS DIRECTED (Patient not taking: Reported on 07/18/2021)  ? diphenhydrAMINE (BENADRYL) 25 MG tablet Take 25 mg by mouth at bedtime as needed for sleep.  ? furosemide (LASIX) 40 MG tablet TAKE 1 TABLET(40 MG) BY MOUTH DAILY  ? nitroGLYCERIN (NITROSTAT) 0.4 MG SL tablet Place 1 tablet (0.4 mg total) under the tongue every 5 (five) minutes as needed.  ? potassium chloride (KLOR-CON) 10 MEQ tablet Take 1 tablet (10 mEq total) by mouth daily.  ? sacubitril-valsartan (ENTRESTO) 24-26 MG Take 1 tablet by mouth 2 (two) times daily.  ? simvastatin (ZOCOR) 40 MG tablet TAKE 1 TABLET BY MOUTH EVERY DAY IN THE EVENING  ? spironolactone (ALDACTONE) 25 MG tablet Take 0.5 tablets (12.5 mg total) by mouth daily.  ? vitamin C (ASCORBIC ACID) 500 MG tablet Take  500 mg by mouth daily.    ? ?No facility-administered encounter medications on file as of 07/31/2021.  ? ? ?ALLERGIES:  ?Allergies  ?Allergen Reactions  ? Codeine Nausea And Vomiting  ? ? ? ?PHYSICAL EXAM:  ?ECOG PERFORMANCE STATUS: 1 - Symptomatic but completely ambulatory ? ?There were no vitals filed for this visit. ?There were no vitals filed for this visit. ?Physical Exam ?Constitutional:   ?   Appearance: Normal appearance.  ?HENT:  ?   Head: Normocephalic and atraumatic.  ?   Mouth/Throat:  ?   Mouth: Mucous membranes are moist.  ?Eyes:  ?   Extraocular Movements: Extraocular movements intact.  ?   Pupils: Pupils are equal, round, and reactive to light.  ?Cardiovascular:  ?   Rate and Rhythm: Normal rate and regular rhythm.  ?   Pulses: Normal pulses.  ?   Heart sounds: Normal heart sounds.  ?Pulmonary:  ?   Effort: Pulmonary effort is normal.  ?   Breath sounds: Normal breath sounds.  ?Abdominal:  ?   General: Bowel sounds are normal.  ?   Palpations: Abdomen is soft.  ?   Tenderness: There is no abdominal tenderness.  ?Musculoskeletal:     ?   General: No swelling.   ?   Right lower leg: Edema (2+) present.  ?   Left lower leg: Edema (2+) present.  ?Lymphadenopathy:  ?   Cervical: No cervical adenopathy.  ?Skin: ?   General: Skin is warm and dry.  ?Neurological:  ?   General:

## 2021-07-31 ENCOUNTER — Inpatient Hospital Stay (HOSPITAL_BASED_OUTPATIENT_CLINIC_OR_DEPARTMENT_OTHER): Payer: Medicare Other | Admitting: Physician Assistant

## 2021-07-31 ENCOUNTER — Encounter (HOSPITAL_COMMUNITY): Payer: Self-pay | Admitting: Physician Assistant

## 2021-07-31 ENCOUNTER — Other Ambulatory Visit: Payer: Self-pay

## 2021-07-31 VITALS — BP 129/49 | HR 67 | Temp 97.3°F | Resp 18 | Ht 64.0 in | Wt 156.1 lb

## 2021-07-31 DIAGNOSIS — E611 Iron deficiency: Secondary | ICD-10-CM

## 2021-07-31 DIAGNOSIS — D649 Anemia, unspecified: Secondary | ICD-10-CM | POA: Diagnosis not present

## 2021-07-31 DIAGNOSIS — E538 Deficiency of other specified B group vitamins: Secondary | ICD-10-CM | POA: Diagnosis not present

## 2021-07-31 DIAGNOSIS — D4620 Refractory anemia with excess of blasts, unspecified: Secondary | ICD-10-CM | POA: Diagnosis not present

## 2021-07-31 NOTE — Patient Instructions (Signed)
[place] at [facility] ?Discharge Instructions ? ?You were seen today by Tarri Abernethy PA-C for your myelodysplastic syndrome (MDS).  Your blood levels remain mildly low, but are stable within their baseline range.  You do not need any treatment for your MDS at this time, but we will recheck your labs in 6 months. ? ?LABS: Return in 6 months for repeat labs ? ?FOLLOW-UP APPOINTMENT: Office visit in 6 months, after labs. ? ? ?Thank you for choosing [place] at [facility] to provide your oncology and hematology care.  To afford each patient quality time with our provider, please arrive at least 15 minutes before your scheduled appointment time.  ? ?If you have a lab appointment with the [facility] please come in thru the Main Entrance and check in at the main information desk. ? ?You need to re-schedule your appointment should you arrive 10 or more minutes late.  We strive to give you quality time with our providers, and arriving late affects you and other patients whose appointments are after yours.  Also, if you no show three or more times for appointments you may be dismissed from the clinic at the providers discretion.     ?Again, thank you for choosing [facility].  Our hope is that these requests will decrease the amount of time that you wait before being seen by our physicians.       ?_____________________________________________________________ ? ?Should you have questions after your visit to [facility], please contact our office at [phone] and follow the prompts.  Our office hours are 8:00 a.m. and 4:30 p.m. Monday - Friday.  Please note that voicemails left after 4:00 p.m. may not be returned until the following business day.  We are closed weekends and major holidays.  You do have access to a nurse 24-7, just call the main number to the clinic [phone] and do not press any options, hold on the line and a nurse  will answer the phone.   ? ?For prescription refill requests, have your pharmacy contact our office and allow 72 hours.   ? ?Due to Covid, you will need to wear a mask upon entering the hospital. If you do not have a mask, a mask will be given to you at the Main Entrance upon arrival. For doctor visits, patients may have 1 support person age 61 or older with them. For treatment visits, patients can not have anyone with them due to social distancing guidelines and our immunocompromised population.  ? ? ? ?

## 2021-08-01 ENCOUNTER — Other Ambulatory Visit (HOSPITAL_COMMUNITY)
Admission: RE | Admit: 2021-08-01 | Discharge: 2021-08-01 | Disposition: A | Discharge status: Home / Self Care | Payer: Medicare Other | Source: Ambulatory Visit | Attending: Cardiology | Admitting: Cardiology

## 2021-08-01 DIAGNOSIS — Z79899 Other long term (current) drug therapy: Secondary | ICD-10-CM | POA: Insufficient documentation

## 2021-08-01 DIAGNOSIS — Z20822 Contact with and (suspected) exposure to covid-19: Secondary | ICD-10-CM | POA: Diagnosis not present

## 2021-08-01 LAB — BASIC METABOLIC PANEL
Anion gap: 7 (ref 5–15)
BUN: 25 mg/dL — ABNORMAL HIGH (ref 8–23)
CO2: 29 mmol/L (ref 22–32)
Calcium: 9 mg/dL (ref 8.9–10.3)
Chloride: 105 mmol/L (ref 98–111)
Creatinine, Ser: 1.15 mg/dL (ref 0.61–1.24)
GFR, Estimated: 60 mL/min — ABNORMAL LOW (ref >60)
Glucose, Bld: 119 mg/dL — ABNORMAL HIGH (ref 70–99)
Potassium: 4.4 mmol/L (ref 3.5–5.1)
Sodium: 141 mmol/L (ref 135–145)

## 2021-08-04 NOTE — Progress Notes (Signed)
Remote pacemaker transmission.   

## 2021-08-07 DIAGNOSIS — E782 Mixed hyperlipidemia: Secondary | ICD-10-CM | POA: Diagnosis not present

## 2021-08-07 DIAGNOSIS — R7303 Prediabetes: Secondary | ICD-10-CM | POA: Diagnosis not present

## 2021-08-07 DIAGNOSIS — E538 Deficiency of other specified B group vitamins: Secondary | ICD-10-CM | POA: Diagnosis not present

## 2021-08-10 DIAGNOSIS — H6123 Impacted cerumen, bilateral: Secondary | ICD-10-CM | POA: Diagnosis not present

## 2021-08-10 DIAGNOSIS — I5022 Chronic systolic (congestive) heart failure: Secondary | ICD-10-CM | POA: Diagnosis not present

## 2021-08-10 DIAGNOSIS — J302 Other seasonal allergic rhinitis: Secondary | ICD-10-CM | POA: Diagnosis not present

## 2021-08-10 DIAGNOSIS — I2510 Atherosclerotic heart disease of native coronary artery without angina pectoris: Secondary | ICD-10-CM | POA: Diagnosis not present

## 2021-08-10 DIAGNOSIS — M1A0720 Idiopathic chronic gout, left ankle and foot, without tophus (tophi): Secondary | ICD-10-CM | POA: Diagnosis not present

## 2021-08-10 DIAGNOSIS — E538 Deficiency of other specified B group vitamins: Secondary | ICD-10-CM | POA: Diagnosis not present

## 2021-08-10 DIAGNOSIS — Z951 Presence of aortocoronary bypass graft: Secondary | ICD-10-CM | POA: Diagnosis not present

## 2021-08-10 DIAGNOSIS — D696 Thrombocytopenia, unspecified: Secondary | ICD-10-CM | POA: Diagnosis not present

## 2021-08-10 DIAGNOSIS — E782 Mixed hyperlipidemia: Secondary | ICD-10-CM | POA: Diagnosis not present

## 2021-08-10 DIAGNOSIS — N1831 Chronic kidney disease, stage 3a: Secondary | ICD-10-CM | POA: Diagnosis not present

## 2021-08-10 DIAGNOSIS — D61818 Other pancytopenia: Secondary | ICD-10-CM | POA: Diagnosis not present

## 2021-08-10 DIAGNOSIS — E1169 Type 2 diabetes mellitus with other specified complication: Secondary | ICD-10-CM | POA: Diagnosis not present

## 2021-08-12 DIAGNOSIS — Z20822 Contact with and (suspected) exposure to covid-19: Secondary | ICD-10-CM | POA: Diagnosis not present

## 2021-08-17 DIAGNOSIS — Z20822 Contact with and (suspected) exposure to covid-19: Secondary | ICD-10-CM | POA: Diagnosis not present

## 2021-08-28 DIAGNOSIS — Z20822 Contact with and (suspected) exposure to covid-19: Secondary | ICD-10-CM | POA: Diagnosis not present

## 2021-09-14 DIAGNOSIS — J302 Other seasonal allergic rhinitis: Secondary | ICD-10-CM | POA: Diagnosis not present

## 2021-09-14 DIAGNOSIS — N1831 Chronic kidney disease, stage 3a: Secondary | ICD-10-CM | POA: Diagnosis not present

## 2021-09-14 DIAGNOSIS — N202 Calculus of kidney with calculus of ureter: Secondary | ICD-10-CM | POA: Diagnosis not present

## 2021-09-14 DIAGNOSIS — E782 Mixed hyperlipidemia: Secondary | ICD-10-CM | POA: Diagnosis not present

## 2021-09-14 DIAGNOSIS — I2510 Atherosclerotic heart disease of native coronary artery without angina pectoris: Secondary | ICD-10-CM | POA: Diagnosis not present

## 2021-09-14 DIAGNOSIS — D61818 Other pancytopenia: Secondary | ICD-10-CM | POA: Diagnosis not present

## 2021-09-14 DIAGNOSIS — Z951 Presence of aortocoronary bypass graft: Secondary | ICD-10-CM | POA: Diagnosis not present

## 2021-09-14 DIAGNOSIS — D696 Thrombocytopenia, unspecified: Secondary | ICD-10-CM | POA: Diagnosis not present

## 2021-09-14 DIAGNOSIS — M1A0720 Idiopathic chronic gout, left ankle and foot, without tophus (tophi): Secondary | ICD-10-CM | POA: Diagnosis not present

## 2021-09-14 DIAGNOSIS — E538 Deficiency of other specified B group vitamins: Secondary | ICD-10-CM | POA: Diagnosis not present

## 2021-09-14 DIAGNOSIS — I5022 Chronic systolic (congestive) heart failure: Secondary | ICD-10-CM | POA: Diagnosis not present

## 2021-09-14 DIAGNOSIS — E1169 Type 2 diabetes mellitus with other specified complication: Secondary | ICD-10-CM | POA: Diagnosis not present

## 2021-09-16 DIAGNOSIS — Z20822 Contact with and (suspected) exposure to covid-19: Secondary | ICD-10-CM | POA: Diagnosis not present

## 2021-09-18 DIAGNOSIS — Z20822 Contact with and (suspected) exposure to covid-19: Secondary | ICD-10-CM | POA: Diagnosis not present

## 2021-09-29 DIAGNOSIS — Z8674 Personal history of sudden cardiac arrest: Secondary | ICD-10-CM | POA: Diagnosis not present

## 2021-09-29 DIAGNOSIS — I5022 Chronic systolic (congestive) heart failure: Secondary | ICD-10-CM | POA: Diagnosis not present

## 2021-09-29 DIAGNOSIS — E669 Obesity, unspecified: Secondary | ICD-10-CM | POA: Diagnosis not present

## 2021-09-29 DIAGNOSIS — N1831 Chronic kidney disease, stage 3a: Secondary | ICD-10-CM | POA: Diagnosis not present

## 2021-09-29 DIAGNOSIS — R600 Localized edema: Secondary | ICD-10-CM | POA: Diagnosis not present

## 2021-09-29 DIAGNOSIS — N202 Calculus of kidney with calculus of ureter: Secondary | ICD-10-CM | POA: Diagnosis not present

## 2021-09-29 DIAGNOSIS — Z951 Presence of aortocoronary bypass graft: Secondary | ICD-10-CM | POA: Diagnosis not present

## 2021-09-29 DIAGNOSIS — I2510 Atherosclerotic heart disease of native coronary artery without angina pectoris: Secondary | ICD-10-CM | POA: Diagnosis not present

## 2021-09-29 DIAGNOSIS — R809 Proteinuria, unspecified: Secondary | ICD-10-CM | POA: Diagnosis not present

## 2021-09-29 DIAGNOSIS — Z6830 Body mass index (BMI) 30.0-30.9, adult: Secondary | ICD-10-CM | POA: Diagnosis not present

## 2021-10-11 DIAGNOSIS — I5032 Chronic diastolic (congestive) heart failure: Secondary | ICD-10-CM | POA: Diagnosis not present

## 2021-10-11 DIAGNOSIS — R82998 Other abnormal findings in urine: Secondary | ICD-10-CM | POA: Diagnosis not present

## 2021-10-11 DIAGNOSIS — R600 Localized edema: Secondary | ICD-10-CM | POA: Diagnosis not present

## 2021-10-11 DIAGNOSIS — R5383 Other fatigue: Secondary | ICD-10-CM | POA: Diagnosis not present

## 2021-10-11 DIAGNOSIS — Z79899 Other long term (current) drug therapy: Secondary | ICD-10-CM | POA: Diagnosis not present

## 2021-10-11 DIAGNOSIS — R0600 Dyspnea, unspecified: Secondary | ICD-10-CM | POA: Diagnosis not present

## 2021-10-18 NOTE — Progress Notes (Signed)
Cardiology Office Note    Date:  10/19/2021   ID:  [name], DOB [birth year], MRN [id number]  PCP:  Luke Squibb, MD  Cardiologist: Luke Lesches, MD   EP: Dr. Lovena [name]  Chief Complaint  Patient presents with   Follow-up    6 week visit    History of Present Illness:    [name] is a 88 y.o. male with past medical history of CAD (s/p CABG in 2006 with LIMA-LAD, SVG-LCx and SVG-dRCA, repeat cath in 12/2020 showing 3/3 patent grafts and likely culprit was 80% LPAV and medical management recommended), HFrEF/ICM (EF at 45-50% in 2016, at 40-45% by repeat echo in 12/2020), Medtronic ICD at [facility] (previously followed by Dr. Lovena [name] and at [facility] in 2016 and not replaced due to improvement in his EF but developed CHB in 04/2021 and underwent gen change and device upgrade to DDD device), history of prostate cancer, nephrolithiasis, MDS, HTN and HLD who presents to the office today for 6-week follow-up.  He was examined by Dr. Domenic Polite in 07/2021 and reported overall feeling well at that time and denied any recent anginal symptoms. Follow-up labs showed his creatinine was stable at 1.25 and he was started on Spironolactone 12.'5mg'$  daily. He also followed up with Dr. Lovena [name] in 07/2021 and his device was functioning normally.   In talking with the patient and his wife today, he reports he was experiencing worsening lower extremity edema but this was in the setting of him not taking Lasix due to frequent urination. He was evaluated by his PCP last week and weight was almost at 154 lbs and he was instructed to start taking Lasix daily. His weight has declined to 149 lbs on our scales. He reports generalized weakness along his legs. Says that his breathing has been stable with no specific dyspnea on exertion, orthopnea or PND. No recent exertional chest pain or palpitations. Has only utilized nitroglycerin once since his last visit.   Past Medical History:  Diagnosis Date   BPH (benign prostatic  hypertrophy)    CHF (congestive heart failure) ([place])    a. EF at 45-50% in 2016 b. EF at 40-45% by repeat echo in 78/2423   Chronic systolic heart failure ([place])    Complete heart block [facility])    Medtronic pacemaker 04/2021 - Dr. Lovena [name]   Coronary artery disease    a. Multivessel status post CABG 8/06, LIMA-LAD, SVG-CFX, SVG-distal RCA b. 12/2020: cath showing 3/3 patent grafts with 80% stenosis along LPAV prior to bifurication and medical management recommended.   Essential hypertension    Hyperlipidemia    Ischemic cardiomyopathy    Myelodysplasia, low grade ([place]) 11/27/2015   Nephrolithiasis    Prostate cancer ([place])    Vitamin B 12 deficiency 07/27/2016    Past Surgical History:  Procedure Laterality Date   CARDIAC DEFIBRILLATOR PLACEMENT     COLONOSCOPY     COLONOSCOPY N/A 08/19/2013   Procedure: COLONOSCOPY;  Surgeon: Rogene Houston, MD;  [facility];  Service: Endoscopy;  Laterality: N/A;  70   CORONARY ARTERY BYPASS GRAFT     8/06 with left anterior descending artery, saphenous vein graft to circumflex, saphenous vein graft to distal right coronary artery   CYSTOSCOPY/URETEROSCOPY/HOLMIUM LASER/STENT PLACEMENT Right 11/07/2020   Procedure: CYSTOSCOPY/URETEROSCOPY/HOLMIUM LASER/STENT PLACEMENT;  Surgeon: Alexis Frock, MD;  [facility];  Service: Urology;  Laterality: Right;   HERNIA REPAIR     LEFT HEART CATH AND CORS/GRAFTS ANGIOGRAPHY N/A 12/19/2020  Procedure: LEFT HEART CATH AND CORS/GRAFTS ANGIOGRAPHY;  Surgeon: Leonie Man, MD;  Location: [place] CV LAB;  Service: Cardiovascular;  Laterality: N/A;   PACEMAKER IMPLANT N/A 04/24/2021   Procedure: PACEMAKER IMPLANT;  Surgeon: Evans Lance, MD;  Location: [place] CV LAB;  Service: Cardiovascular;  Laterality: N/A;   POLYPECTOMY     PROSTATE SURGERY     TEMPORARY PACEMAKER N/A 04/21/2021   Procedure: TEMPORARY PACEMAKER;  Surgeon: Martinique, Peter M, MD;  Location: [place] CV LAB;  Service:  Cardiovascular;  Laterality: N/A;    Current Medications: Outpatient Medications Prior to Visit  Medication Sig Dispense Refill   acetaminophen (TYLENOL) 500 MG tablet Take 1 tablet (500 mg total) by mouth every 6 (six) hours as needed for mild pain or fever. 30 tablet 0   Ascorbic Acid (VITAMIN C) 500 MG CAPS Take 500 mg by mouth daily.     aspirin EC 81 MG tablet Take 81 mg by mouth daily. Swallow whole.     carvedilol (COREG) 12.5 MG tablet Take 1 tablet (12.5 mg total) by mouth 2 (two) times daily with a meal. 180 tablet 3   Cholecalciferol (VITAMIN D3) 1000 UNITS CAPS Take 1 capsule by mouth daily.     diphenhydrAMINE (BENADRYL) 25 MG tablet Take 25 mg by mouth at bedtime as needed for sleep.     furosemide (LASIX) 40 MG tablet TAKE 1 TABLET(40 MG) BY MOUTH DAILY 90 tablet 3   Melatonin 5 MG CAPS Take 5 mg by mouth as needed.     nitroGLYCERIN (NITROSTAT) 0.4 MG SL tablet Place 1 tablet (0.4 mg total) under the tongue every 5 (five) minutes as needed. 25 tablet 3   potassium chloride (KLOR-CON) 10 MEQ tablet Take 1 tablet (10 mEq total) by mouth daily. 30 tablet 0   sacubitril-valsartan (ENTRESTO) 24-26 MG Take 1 tablet by mouth 2 (two) times daily. 60 tablet 6   simvastatin (ZOCOR) 40 MG tablet TAKE 1 TABLET BY MOUTH EVERY DAY IN THE EVENING 90 tablet 2   spironolactone (ALDACTONE) 25 MG tablet Take 0.5 tablets (12.5 mg total) by mouth daily. 45 tablet 3   vitamin C (ASCORBIC ACID) 500 MG tablet Take 500 mg by mouth daily.       Vitamin D3 (VITAMIN D) 25 MCG tablet Take 25 mcg by mouth daily.     zinc gluconate 50 MG tablet Take 50 mg by mouth daily.     acetaminophen (TYLENOL) 500 MG tablet Take 500 mg by mouth every 6 (six) hours as needed. (Patient not taking: Reported on 10/19/2021)     No facility-administered medications prior to visit.     Allergies:   Codeine   Social History   Socioeconomic History   Marital status: Married    Spouse name: Not on file   Number of  children: Not on file   Years of education: Not on file   Highest education level: Not on file  Occupational History   Occupation: Retired    Fish farm manager: RETIRED  Tobacco Use   Smoking status: Former    Packs/day: 0.25    Years: 44.00    Total pack years: 11.00    Types: Cigarettes    Quit date: 05/14/1992    Years since quitting: 29.4   Smokeless tobacco: Never  Vaping Use   Vaping Use: Never used  Substance and Sexual Activity   Alcohol use: No   Drug use: No   Sexual activity: Not on file  Comment: married 82 years  Other Topics Concern   Not on file  Social History Narrative   Not on file   Social Determinants of Health   Financial Resource Strain: Not on file  Food Insecurity: Not on file  Transportation Needs: Not on file  Physical Activity: Not on file  Stress: Not on file  Social Connections: Not on file     Family History:  The patient's family history includes Colon cancer in his father; Coronary artery disease in an other family member.   Review of Systems:    Please see the history of present illness.     All other systems reviewed and are otherwise negative except as noted above.   Physical Exam:    VS:  BP 124/66   Pulse 62   Ht '5\' 4"'$  (1.626 m)   Wt 149 lb (67.6 kg)   SpO2 99%   BMI 25.58 kg/m    General: Pleasant elderly male appearing in no acute distress. Head: Normocephalic, atraumatic. Neck: No carotid bruits. JVD not elevated.  Lungs: Respirations regular and unlabored, without wheezes or rales.  Heart: Regular rate and rhythm. No S3 or S4.  No murmur, no rubs, or gallops appreciated. Abdomen: Appears non-distended. No obvious abdominal masses. Msk:  Strength and tone appear normal for age. No obvious joint deformities or effusions. Extremities: No clubbing or cyanosis. 1+ pitting edema bilaterally.  Distal pedal pulses are 2+ bilaterally. Neuro: Alert and oriented X 3. Moves all extremities spontaneously. No focal deficits  noted. Psych:  Responds to questions appropriately with a normal affect. Skin: No rashes or lesions noted  Wt Readings from Last 3 Encounters:  10/19/21 149 lb (67.6 kg)  07/31/21 156 lb 1.4 oz (70.8 kg)  07/25/21 154 lb (69.9 kg)     Studies/Labs Reviewed:   EKG:  EKG is not ordered today.   Recent Labs: 04/22/2021: TSH 3.006 04/25/2021: Magnesium 1.8 06/22/2021: B Natriuretic Peptide 2,087.0 07/24/2021: ALT 8; Hemoglobin 12.1; Platelets 133 08/01/2021: BUN 25; Creatinine, Ser 1.15; Potassium 4.4; Sodium 141   Lipid Panel    Component Value Date/Time   CHOL 125 12/17/2020 0137   TRIG 71 12/17/2020 0137   HDL 29 (L) 12/17/2020 0137   CHOLHDL 4.3 12/17/2020 0137   VLDL 14 12/17/2020 0137   LDLCALC 82 12/17/2020 0137   LDLCALC 78 06/09/2019 0808    Additional studies/ records that were reviewed today include:   Echocardiogram: 12/2020 IMPRESSIONS     1. Poor acoustic windows.   2. Poor acoustic windows limit study Endocardium is not seen in all  segments even with DEFINITY There appears to be hypokinesis of the basal  inferior, basal inferoseptal, the inferolaterall and apical walls OVerall  LVEF appears mildly depressed LVEF 40  to 45%, again EF difficult given poor image quality. . The left  ventricular internal cavity size was severely dilated. There is mild left  ventricular hypertrophy. Left ventricular diastolic parameters are  indeterminate.   3. Device lead seen in RV to distal apex. Right ventricular systolic  function is normal. The right ventricular size is normal. There is  moderately elevated pulmonary artery systolic pressure.   4. Left atrial size was severely dilated.   5. The mitral valve is normal in structure. Mild mitral valve  regurgitation.   6. The aortic valve is tricuspid. Aortic valve regurgitation is not  visualized. Mild to moderate aortic valve sclerosis/calcification is  present, without any evidence of aortic stenosis.   7. The  inferior vena cava is dilated in size with <50% respiratory  variability, suggesting right atrial pressure of 15 mmHg.   LHC: 12/2020 Prox RCA to Dist RCA lesion is 100% stenosed with 100% stenosed side branch in RV Branch.   Mid LM to Prox LAD lesion is 60% stenosed.   Prox Cx to Mid Cx lesion is 100% stenosed.   Prox LAD to Mid LAD lesion is 80% stenosed with 95% stenosed side branch in 2nd Diag.   Mid LAD lesion is 100% stenosed with 75% stenosed side branch in 3rd Diag.   ** LPAV lesion is 80% stenosed. -Fills via retrograde flow from SVG-OM 2.  Not approachable percutaneously.   LIMA-LAD and is normal in caliber.  The graft exhibits no disease.   SVG-2nd Mrg graft was visualized by angiography and is large.  The graft exhibits no disease.   SVG-dRCA and is normal in caliber.  The graft exhibits no disease.   ----------------------   LV end diastolic pressure is moderately elevated.   There is no aortic valve stenosis.   SUMMARY Severe native CAD: 50-60% LEFT MAIN,  100% flush ostial RCA occlusion, 100 % proximal LCx occlusion after small OM1/RI,  Diffuse 80% proximal-mid followed by 100% mid LAD occlusion 3 of 3 patent grafts: LIMA-LAD, SVG-OM 2, SVG-RCA. Likely culprit lesion is 80% stenosis in the distal AVG LCx prior to bifurcation into LPL 1 and LPL 2 -> this fills via retrograde flow from the SVG-OM 2.  Not approachable from percutaneous option. Moderately elevated LAP.   RECOMMENDATIONS Optimize medical management.  Assessment:    1. Coronary artery disease involving native coronary artery of native heart without angina pectoris   2. HFrEF (heart failure with reduced ejection fraction) (HCC)   3. Heart block AV complete ([place])   4. Essential hypertension   5. Hyperlipidemia LDL goal <70      Plan:   In order of problems listed above:  1. CAD - He is s/p CABG in 2006 with LIMA-LAD, SVG-LCx and SVG-dRCA with repeat cath in 12/2020 showing 3/3 patent grafts and  likely culprit was 80% LPAV and medical management was recommended.  - He is not overly active at baseline but denies any recent anginal symptoms. Continue current medical therapy with ASA 81 mg daily, Coreg 12.5 mg twice daily, Simvastatin 40 mg daily and PRN SL NTG.   2. HFmrEF/ICM  - His EF was at 45-50% in 2016 with similar results of 40-45% by repeat echo in 12/2020. He has experienced worsening lower extremity edema in the setting of intermittent compliance with Lasix due to frequent urination. Since restarting this last week, his weight has declined by 5 pounds. Continued compliance with Lasix 40 mg daily was strongly encouraged. Continue current medical therapy including Coreg 12.5 mg twice daily, Entresto 24-26 mg twice daily and Spironolactone 12.5 mg daily. Would not start an SGLT2 inhibitor given his age and variable PO intake.   3. CHB - He is s/p Medtronic PPM placement in 04/2021. Followed by Dr. Lovena [name]. His device was functioning normally when checked in 07/2021.  4. HTN - His blood pressure is well controlled at 124/66 during today's visit.  Continue current medical therapy.  5. HLD - Followed by his PCP.  He remains on Simvastatin 40 mg daily with goal LDL less than 70.     Medication Adjustments/Labs and Tests Ordered: Current medicines are reviewed at length with the patient today.  Concerns regarding medicines are outlined above.  Medication changes,  Labs and Tests ordered today are listed in the Patient Instructions below. Patient Instructions  Medication Instructions:   Continue current medication regimen. Continue to take Lasix DAILY!  Labwork:  None  Testing/Procedures:  None  Follow-Up:  3-4 months with Bernerd Pho, PA-C or Dr. Domenic Polite   Any Other Special Instructions Will Be Listed Below (If Applicable).  If you need a refill on your cardiac medications before your next appointment, please call your pharmacy.    Signed, Erma Heritage,  PA-C  10/19/2021 4:50 PM    [facility] [address] [place], [place] [address] Phone: [phone] Fax: [phone]

## 2021-10-19 ENCOUNTER — Encounter: Payer: Self-pay | Admitting: Student

## 2021-10-19 ENCOUNTER — Ambulatory Visit (INDEPENDENT_AMBULATORY_CARE_PROVIDER_SITE_OTHER): Payer: Medicare Other | Admitting: Student

## 2021-10-19 VITALS — BP 124/66 | HR 62 | Ht 64.0 in | Wt 149.0 lb

## 2021-10-19 DIAGNOSIS — I2510 Atherosclerotic heart disease of native coronary artery without angina pectoris: Secondary | ICD-10-CM | POA: Diagnosis not present

## 2021-10-19 DIAGNOSIS — E785 Hyperlipidemia, unspecified: Secondary | ICD-10-CM

## 2021-10-19 DIAGNOSIS — I10 Essential (primary) hypertension: Secondary | ICD-10-CM | POA: Diagnosis not present

## 2021-10-19 DIAGNOSIS — I442 Atrioventricular block, complete: Secondary | ICD-10-CM | POA: Diagnosis not present

## 2021-10-19 DIAGNOSIS — I25119 Atherosclerotic heart disease of native coronary artery with unspecified angina pectoris: Secondary | ICD-10-CM

## 2021-10-19 DIAGNOSIS — I5020 Unspecified systolic (congestive) heart failure: Secondary | ICD-10-CM

## 2021-10-19 NOTE — Patient Instructions (Signed)
Medication Instructions:   Continue current medication regimen. Continue to take Lasix DAILY!  Labwork:  None  Testing/Procedures:  None  Follow-Up:  3-4 months with Bernerd Pho, PA-C or Dr. Domenic Polite   Any Other Special Instructions Will Be Listed Below (If Applicable).  If you need a refill on your cardiac medications before your next appointment, please call your pharmacy.

## 2021-10-26 ENCOUNTER — Ambulatory Visit (INDEPENDENT_AMBULATORY_CARE_PROVIDER_SITE_OTHER): Payer: Medicare Other

## 2021-10-26 DIAGNOSIS — I442 Atrioventricular block, complete: Secondary | ICD-10-CM | POA: Diagnosis not present

## 2021-10-26 LAB — CUP PACEART REMOTE DEVICE CHECK
Battery Remaining Longevity: 118 mo
Battery Voltage: 3.1 V
Brady Statistic AP VP Percent: 54.87 %
Brady Statistic AP VS Percent: 0.01 %
Brady Statistic AS VP Percent: 43.48 %
Brady Statistic AS VS Percent: 1.64 %
Brady Statistic RA Percent Paced: 54.95 %
Brady Statistic RV Percent Paced: 98.35 %
Date Time Interrogation Session: 20230615042703
Implantable Lead Implant Date: 20071018
Implantable Lead Implant Date: 20221212
Implantable Lead Location: 753859
Implantable Lead Location: 753860
Implantable Lead Model: 5076
Implantable Lead Model: 6947
Implantable Pulse Generator Implant Date: 20221212
Lead Channel Impedance Value: 228 Ohm
Lead Channel Impedance Value: 266 Ohm
Lead Channel Impedance Value: 342 Ohm
Lead Channel Impedance Value: 342 Ohm
Lead Channel Pacing Threshold Amplitude: 0.5 V
Lead Channel Pacing Threshold Amplitude: 0.625 V
Lead Channel Pacing Threshold Pulse Width: 0.4 ms
Lead Channel Pacing Threshold Pulse Width: 0.4 ms
Lead Channel Sensing Intrinsic Amplitude: 1.5 mV
Lead Channel Sensing Intrinsic Amplitude: 1.5 mV
Lead Channel Sensing Intrinsic Amplitude: 13.25 mV
Lead Channel Sensing Intrinsic Amplitude: 13.25 mV
Lead Channel Setting Pacing Amplitude: 2 V
Lead Channel Setting Pacing Amplitude: 2.5 V
Lead Channel Setting Pacing Pulse Width: 0.4 ms
Lead Channel Setting Sensing Sensitivity: 5.6 mV

## 2021-11-03 NOTE — Progress Notes (Signed)
Remote pacemaker transmission.   

## 2021-11-23 DIAGNOSIS — R809 Proteinuria, unspecified: Secondary | ICD-10-CM | POA: Diagnosis not present

## 2021-11-23 DIAGNOSIS — I129 Hypertensive chronic kidney disease with stage 1 through stage 4 chronic kidney disease, or unspecified chronic kidney disease: Secondary | ICD-10-CM | POA: Diagnosis not present

## 2021-11-23 DIAGNOSIS — Z7689 Persons encountering health services in other specified circumstances: Secondary | ICD-10-CM | POA: Diagnosis not present

## 2021-11-23 DIAGNOSIS — E278 Other specified disorders of adrenal gland: Secondary | ICD-10-CM | POA: Diagnosis not present

## 2021-11-23 DIAGNOSIS — I5042 Chronic combined systolic (congestive) and diastolic (congestive) heart failure: Secondary | ICD-10-CM | POA: Diagnosis not present

## 2021-11-23 DIAGNOSIS — E1122 Type 2 diabetes mellitus with diabetic chronic kidney disease: Secondary | ICD-10-CM | POA: Diagnosis not present

## 2021-11-23 DIAGNOSIS — I2510 Atherosclerotic heart disease of native coronary artery without angina pectoris: Secondary | ICD-10-CM | POA: Diagnosis not present

## 2021-11-23 DIAGNOSIS — E1129 Type 2 diabetes mellitus with other diabetic kidney complication: Secondary | ICD-10-CM | POA: Diagnosis not present

## 2021-11-23 DIAGNOSIS — D638 Anemia in other chronic diseases classified elsewhere: Secondary | ICD-10-CM | POA: Diagnosis not present

## 2021-11-23 DIAGNOSIS — D469 Myelodysplastic syndrome, unspecified: Secondary | ICD-10-CM | POA: Diagnosis not present

## 2021-11-23 DIAGNOSIS — N200 Calculus of kidney: Secondary | ICD-10-CM | POA: Diagnosis not present

## 2021-11-23 DIAGNOSIS — N189 Chronic kidney disease, unspecified: Secondary | ICD-10-CM | POA: Diagnosis not present

## 2021-11-25 NOTE — Progress Notes (Signed)
[place], [place] [address]   CLINIC:  Medical Oncology/Hematology  PCP:  [name], MD [place] [address] [phone]   REASON FOR VISIT:  Follow-up for ***  PRIOR THERAPY: ***  CURRENT THERAPY: ***  INTERVAL HISTORY:  [name] 88 y.o. male returns for routine follow-up of ***  At today's visit, he reports feeling ***.  No recent hospitalizations, surgeries, or changes in baseline health status.  *** No fatigue, fever, chills, shortness of breath, cough, chest pain, nausea, vomiting, abdominal pain.  Denies any signs or symptoms of blood loss.  No current signs or symptoms of blood clots.  He has ***% energy and ***% appetite. He endorses that he is maintaining a stable weight.    REVIEW OF SYSTEMS:  Review of Systems - Oncology    PAST MEDICAL/SURGICAL HISTORY:  Past Medical History:  Diagnosis Date  . BPH (benign prostatic hypertrophy)   . CHF (congestive heart failure) ([place])    a. EF at 45-50% in 2016 b. EF at 40-45% by repeat echo in 12/2020  . Chronic systolic heart failure ([place])   . Complete heart block [facility])    Medtronic pacemaker 04/2021 - Dr. Lovena Le  . Coronary artery disease    a. Multivessel status post CABG 8/06, LIMA-LAD, SVG-CFX, SVG-distal RCA b. 12/2020: cath showing 3/3 patent grafts with 80% stenosis along LPAV prior to bifurication and medical management recommended.  . Essential hypertension   . Hyperlipidemia   . Ischemic cardiomyopathy   . Myelodysplasia, low grade ([place]) 11/27/2015  . Nephrolithiasis   . Prostate cancer ([place])   . Vitamin B 12 deficiency 07/27/2016   Past Surgical History:  Procedure Laterality Date  . CARDIAC DEFIBRILLATOR PLACEMENT    . COLONOSCOPY    . COLONOSCOPY N/A 08/19/2013   Procedure: COLONOSCOPY;  Surgeon: Rogene Houston, MD;  [facility];  Service: Endoscopy;  Laterality: N/A;  930  . CORONARY ARTERY BYPASS GRAFT     8/06 with  left anterior descending artery, saphenous vein graft to circumflex, saphenous vein graft to distal right coronary artery  . CYSTOSCOPY/URETEROSCOPY/HOLMIUM LASER/STENT PLACEMENT Right 11/07/2020   Procedure: CYSTOSCOPY/URETEROSCOPY/HOLMIUM LASER/STENT PLACEMENT;  Surgeon: Alexis Frock, MD;  [facility];  Service: Urology;  Laterality: Right;  . HERNIA REPAIR    . LEFT HEART CATH AND CORS/GRAFTS ANGIOGRAPHY N/A 12/19/2020   Procedure: LEFT HEART CATH AND CORS/GRAFTS ANGIOGRAPHY;  Surgeon: Leonie Man, MD;  Location: [place] CV LAB;  Service: Cardiovascular;  Laterality: N/A;  . PACEMAKER IMPLANT N/A 04/24/2021   Procedure: PACEMAKER IMPLANT;  Surgeon: Evans Lance, MD;  Location: [place] CV LAB;  Service: Cardiovascular;  Laterality: N/A;  . POLYPECTOMY    . PROSTATE SURGERY    . TEMPORARY PACEMAKER N/A 04/21/2021   Procedure: TEMPORARY PACEMAKER;  Surgeon: Martinique, Peter M, MD;  Location: [place] CV LAB;  Service: Cardiovascular;  Laterality: N/A;     SOCIAL HISTORY:  Social History   Socioeconomic History  . Marital status: Married    Spouse name: Not on file  . Number of children: Not on file  . Years of education: Not on file  . Highest education level: Not on file  Occupational History  . Occupation: Retired    Fish farm manager: RETIRED  Tobacco Use  . Smoking status: Former    Packs/day: 0.25    Years: 44.00    Total pack years: 11.00  Types: Cigarettes    Quit date: 05/14/1992    Years since quitting: 29.5  . Smokeless tobacco: Never  Vaping Use  . Vaping Use: Never used  Substance and Sexual Activity  . Alcohol use: No  . Drug use: No  . Sexual activity: Not on file    Comment: married 63 years  Other Topics Concern  . Not on file  Social History Narrative  . Not on file   Social Determinants of Health   Financial Resource Strain: Not on file  Food Insecurity: Not on file  Transportation Needs: Not on file  Physical Activity: Not on file   Stress: Not on file  Social Connections: Not on file  Intimate Partner Violence: Not on file    FAMILY HISTORY:  Family History  Problem Relation Age of Onset  . Colon cancer Father   . Coronary artery disease Other     CURRENT MEDICATIONS:  Outpatient Encounter Medications as of 11/27/2021  Medication Sig Note  . acetaminophen (TYLENOL) 500 MG tablet Take 1 tablet (500 mg total) by mouth every 6 (six) hours as needed for mild pain or fever.   Marland Kitchen acetaminophen (TYLENOL) 500 MG tablet Take 500 mg by mouth every 6 (six) hours as needed. (Patient not taking: Reported on 10/19/2021) 07/31/2021: Med Classification: Analgesic, Anti-inflammatory or Antipyretic  . Ascorbic Acid (VITAMIN C) 500 MG CAPS Take 500 mg by mouth daily. 07/31/2021: Med Classification: Electrolyte Balance-Nutritional Products  . aspirin EC 81 MG tablet Take 81 mg by mouth daily. Swallow whole.   . carvedilol (COREG) 12.5 MG tablet Take 1 tablet (12.5 mg total) by mouth 2 (two) times daily with a meal.   . Cholecalciferol (VITAMIN D3) 1000 UNITS CAPS Take 1 capsule by mouth daily.   . diphenhydrAMINE (BENADRYL) 25 MG tablet Take 25 mg by mouth at bedtime as needed for sleep.   . furosemide (LASIX) 40 MG tablet TAKE 1 TABLET(40 MG) BY MOUTH DAILY   . Melatonin 5 MG CAPS Take 5 mg by mouth as needed. 07/31/2021: Med Classification: Central Nervous System Agents  . nitroGLYCERIN (NITROSTAT) 0.4 MG SL tablet Place 1 tablet (0.4 mg total) under the tongue every 5 (five) minutes as needed.   . potassium chloride (KLOR-CON) 10 MEQ tablet Take 1 tablet (10 mEq total) by mouth daily.   . sacubitril-valsartan (ENTRESTO) 24-26 MG Take 1 tablet by mouth 2 (two) times daily.   . simvastatin (ZOCOR) 40 MG tablet TAKE 1 TABLET BY MOUTH EVERY DAY IN THE EVENING   . spironolactone (ALDACTONE) 25 MG tablet Take 0.5 tablets (12.5 mg total) by mouth daily.   . vitamin C (ASCORBIC ACID) 500 MG tablet Take 500 mg by mouth daily.     . Vitamin D3  (VITAMIN D) 25 MCG tablet Take 25 mcg by mouth daily. 07/31/2021: Med Classification: Electrolyte Balance-Nutritional Products  . zinc gluconate 50 MG tablet Take 50 mg by mouth daily. 07/31/2021: Med Classification: Electrolyte Balance-Nutritional Products   No facility-administered encounter medications on file as of 11/27/2021.    ALLERGIES:  Allergies  Allergen Reactions  . Codeine Nausea And Vomiting     PHYSICAL EXAM:  ECOG PERFORMANCE STATUS: {CHL ONC ECOG [phone]}  There were no vitals filed for this visit. There were no vitals filed for this visit. Physical Exam   LABORATORY DATA:  I have reviewed the labs as listed.  CBC    Component Value Date/Time   WBC 4.0 07/24/2021 1344   RBC 4.05 (L) 07/24/2021  1344   HGB 12.1 (L) 07/24/2021 1344   HCT 38.7 (L) 07/24/2021 1344   PLT 133 (L) 07/24/2021 1344   MCV 95.6 07/24/2021 1344   MCH 29.9 07/24/2021 1344   MCHC 31.3 07/24/2021 1344   RDW 13.4 07/24/2021 1344   LYMPHSABS 1.2 07/24/2021 1344   MONOABS 1.3 (H) 07/24/2021 1344   EOSABS 0.0 07/24/2021 1344   BASOSABS 0.0 07/24/2021 1344      Latest Ref Rng & Units 08/01/2021   11:04 AM 07/24/2021    1:44 PM 07/18/2021   10:43 AM  CMP  Glucose 70 - 99 mg/dL 119  146  135   BUN 8 - 23 mg/dL '25  25  30   '$ Creatinine 0.61 - 1.24 mg/dL 1.15  1.52  1.25   Sodium 135 - 145 mmol/L 141  140  140   Potassium 3.5 - 5.1 mmol/L 4.4  4.8  4.4   Chloride 98 - 111 mmol/L 105  105  106   CO2 22 - 32 mmol/L '29  27  28   '$ Calcium 8.9 - 10.3 mg/dL 9.0  8.7  8.7   Total Protein 6.5 - 8.1 g/dL  6.3    Total Bilirubin 0.3 - 1.2 mg/dL  0.9    Alkaline Phos 38 - 126 U/L  52    AST 15 - 41 U/L  11    ALT 0 - 44 U/L  8      DIAGNOSTIC IMAGING:  I have independently reviewed the relevant imaging and discussed with the patient.  ASSESSMENT & PLAN: 1.  *** - *** - PLAN: ***  2.  *** - *** - PLAN: ***  PLAN SUMMARY & DISPOSITION: ***  All questions were answered. The patient  knows to call the clinic with any problems, questions or concerns.  Medical decision making: ***  Time spent on visit: I spent {CHL ONC TIME VISIT - GNFAO:[id number]} counseling the patient face to face. The total time spent in the appointment was {CHL ONC TIME VISIT - NGEXB:[id number]} and more than 50% was on counseling.   Harriett Rush, PA-C  ***

## 2021-11-27 ENCOUNTER — Encounter (HOSPITAL_COMMUNITY): Payer: Self-pay

## 2021-11-27 ENCOUNTER — Other Ambulatory Visit (HOSPITAL_COMMUNITY): Payer: Self-pay

## 2021-11-27 ENCOUNTER — Encounter (HOSPITAL_COMMUNITY): Payer: Self-pay | Admitting: Hematology

## 2021-11-27 ENCOUNTER — Inpatient Hospital Stay (HOSPITAL_COMMUNITY): Payer: Medicare Other | Attending: Physician Assistant | Admitting: Hematology

## 2021-11-27 ENCOUNTER — Inpatient Hospital Stay (HOSPITAL_COMMUNITY): Payer: Medicare Other

## 2021-11-27 VITALS — BP 124/61 | HR 60 | Temp 97.3°F | Resp 16 | Ht 64.0 in | Wt 144.4 lb

## 2021-11-27 DIAGNOSIS — E278 Other specified disorders of adrenal gland: Secondary | ICD-10-CM | POA: Insufficient documentation

## 2021-11-27 DIAGNOSIS — E279 Disorder of adrenal gland, unspecified: Secondary | ICD-10-CM | POA: Diagnosis not present

## 2021-11-27 DIAGNOSIS — D4620 Refractory anemia with excess of blasts, unspecified: Secondary | ICD-10-CM | POA: Diagnosis not present

## 2021-11-27 DIAGNOSIS — E611 Iron deficiency: Secondary | ICD-10-CM

## 2021-11-27 DIAGNOSIS — C7412 Malignant neoplasm of medulla of left adrenal gland: Secondary | ICD-10-CM

## 2021-11-27 DIAGNOSIS — D46Z Other myelodysplastic syndromes: Secondary | ICD-10-CM

## 2021-11-27 DIAGNOSIS — D649 Anemia, unspecified: Secondary | ICD-10-CM

## 2021-11-27 DIAGNOSIS — E538 Deficiency of other specified B group vitamins: Secondary | ICD-10-CM

## 2021-11-27 DIAGNOSIS — R634 Abnormal weight loss: Secondary | ICD-10-CM

## 2021-11-27 DIAGNOSIS — E109 Type 1 diabetes mellitus without complications: Secondary | ICD-10-CM | POA: Diagnosis not present

## 2021-11-27 LAB — CORTISOL: Cortisol, Plasma: 16.1 ug/dL

## 2021-11-27 NOTE — Patient Instructions (Addendum)
[place] at [facility] Discharge Instructions  You were seen and examined today by Dr. Delton Coombes and Tarri Abernethy, PA-C. Dr. Delton Coombes is a medical oncologist, meaning that he specializes in the treatment of cancer diagnoses. Dr. Delton Coombes discussed your past medical history, family history of cancers, and the events that led to you being here today.  You were seen by today due to an abnormal CT scan from June 2022. That CT scan revealed a mass on your adrenal gland. The adrenal glands are hormone-secreting glands that sit atop each of your kidneys. The mass is concerning for cancer.  Dr. Delton Coombes has recommended a PET scan. A PET scan is a whole body CT scan that illuminates where the is cancer present in the body. If the mass in the adrenal gland is concerning for cancer, it will glow on the PET scan.  Follow-up with Dr. Delton Coombes as scheduled. At this time, the next steps will be discussed.  Thank you for choosing [place] at [facility] to provide your oncology and hematology care.  To afford each patient quality time with our provider, please arrive at least 15 minutes before your scheduled appointment time.   If you have a lab appointment with the [place] please come in thru the Main Entrance and check in at the main information desk.  You need to re-schedule your appointment should you arrive 10 or more minutes late.  We strive to give you quality time with our providers, and arriving late affects you and other patients whose appointments are after yours.  Also, if you no show three or more times for appointments you may be dismissed from the clinic at the providers discretion.     Again, thank you for choosing [facility].  Our hope is that these requests will decrease the amount of time that you wait before being seen by our physicians.        _____________________________________________________________  Should you have questions after your visit to [facility], please contact our office at [phone] and follow the prompts.  Our office hours are 8:00 a.m. and 4:30 p.m. Monday - Friday.  Please note that voicemails left after 4:00 p.m. may not be returned until the following business day.  We are closed weekends and major holidays.  You do have access to a nurse 24-7, just call the main number to the clinic [phone] and do not press any options, hold on the line and a nurse will answer the phone.    For prescription refill requests, have your pharmacy contact our office and allow 72 hours.

## 2021-11-27 NOTE — Progress Notes (Signed)
Notified today of add on for new oncology diagnosis. Patient was seen as a split visit with Tarri Abernethy and Dr. Delton Coombes. No initial phone call to patient.  Met with patient and his wife, during and following the initial visit with Dr. Delton Coombes.  I introduced myself and explained my role in his care.  No needs identified at this time.  My contact information provided for him and his wife should they have any questions.

## 2021-11-29 LAB — ACTH: C206 ACTH: 4.7 pg/mL — ABNORMAL LOW (ref 7.2–63.3)

## 2021-11-29 LAB — DHEA-SULFATE: DHEA-SO4: >1000 ug/dL — AB (ref 20.8–226.4)

## 2021-11-30 ENCOUNTER — Encounter (HOSPITAL_COMMUNITY)
Admission: RE | Admit: 2021-11-30 | Discharge: 2021-11-30 | Disposition: A | Discharge status: Home / Self Care | Payer: Medicare Other | Source: Ambulatory Visit | Attending: Hematology | Admitting: Hematology

## 2021-11-30 DIAGNOSIS — R634 Abnormal weight loss: Secondary | ICD-10-CM | POA: Insufficient documentation

## 2021-11-30 DIAGNOSIS — E278 Other specified disorders of adrenal gland: Secondary | ICD-10-CM | POA: Diagnosis not present

## 2021-11-30 DIAGNOSIS — C7412 Malignant neoplasm of medulla of left adrenal gland: Secondary | ICD-10-CM | POA: Insufficient documentation

## 2021-11-30 LAB — METANEPHRINES, PLASMA
Metanephrine, Free: 31 pg/mL (ref 0.0–88.0)
Normetanephrine, Free: 75.1 pg/mL (ref 0.0–297.2)

## 2021-11-30 MED ORDER — FLUDEOXYGLUCOSE F - 18 (FDG) INJECTION
7.6100 | Freq: Once | INTRAVENOUS | Status: AC | PRN
  Administered 2021-11-30: 7.61 via INTRAVENOUS

## 2021-12-05 ENCOUNTER — Inpatient Hospital Stay (HOSPITAL_BASED_OUTPATIENT_CLINIC_OR_DEPARTMENT_OTHER): Payer: Medicare Other | Admitting: Hematology

## 2021-12-05 VITALS — BP 140/63 | HR 62 | Temp 97.6°F | Resp 18 | Ht 64.0 in | Wt 154.2 lb

## 2021-12-05 DIAGNOSIS — E278 Other specified disorders of adrenal gland: Secondary | ICD-10-CM

## 2021-12-05 DIAGNOSIS — I5042 Chronic combined systolic (congestive) and diastolic (congestive) heart failure: Secondary | ICD-10-CM | POA: Diagnosis not present

## 2021-12-05 DIAGNOSIS — Z7689 Persons encountering health services in other specified circumstances: Secondary | ICD-10-CM | POA: Diagnosis not present

## 2021-12-05 DIAGNOSIS — D469 Myelodysplastic syndrome, unspecified: Secondary | ICD-10-CM | POA: Diagnosis not present

## 2021-12-05 DIAGNOSIS — D638 Anemia in other chronic diseases classified elsewhere: Secondary | ICD-10-CM | POA: Diagnosis not present

## 2021-12-05 DIAGNOSIS — Z79899 Other long term (current) drug therapy: Secondary | ICD-10-CM | POA: Diagnosis not present

## 2021-12-05 DIAGNOSIS — N189 Chronic kidney disease, unspecified: Secondary | ICD-10-CM | POA: Diagnosis not present

## 2021-12-05 DIAGNOSIS — E1129 Type 2 diabetes mellitus with other diabetic kidney complication: Secondary | ICD-10-CM | POA: Diagnosis not present

## 2021-12-05 DIAGNOSIS — I129 Hypertensive chronic kidney disease with stage 1 through stage 4 chronic kidney disease, or unspecified chronic kidney disease: Secondary | ICD-10-CM | POA: Diagnosis not present

## 2021-12-05 DIAGNOSIS — E1122 Type 2 diabetes mellitus with diabetic chronic kidney disease: Secondary | ICD-10-CM | POA: Diagnosis not present

## 2021-12-05 DIAGNOSIS — R809 Proteinuria, unspecified: Secondary | ICD-10-CM | POA: Diagnosis not present

## 2021-12-05 DIAGNOSIS — E279 Disorder of adrenal gland, unspecified: Secondary | ICD-10-CM | POA: Diagnosis not present

## 2021-12-05 NOTE — Progress Notes (Signed)
[place], [place] [address]   CLINIC:  Medical Oncology/Hematology  PCP:  [name], MD [address] [name] [address] [phone]   REASON FOR VISIT:  Follow-up for adrenal mass  PRIOR THERAPY: none  NGS Results: not done  CURRENT THERAPY: under work-up  BRIEF ONCOLOGIC HISTORY:  Oncology History   No history exists.    CANCER STAGING: Cancer Staging  No matching staging information was found for the patient.  INTERVAL HISTORY:  [name], a 88 y.o. male, returns for routine follow-up of his adrenal mass. Adrien was last seen on 11/27/2021.   Today he reports feeling well, and he is accompanied by his wife and son. He denies new pains. His appetite and sleep are good. His energy is slightly low.   REVIEW OF SYSTEMS:  Review of Systems  Constitutional:  Positive for fatigue. Negative for appetite change.  Cardiovascular:  Positive for leg swelling.  Gastrointestinal:  Positive for diarrhea.  Musculoskeletal:  Negative for arthralgias.  Psychiatric/Behavioral:  Negative for sleep disturbance.   All other systems reviewed and are negative.   PAST MEDICAL/SURGICAL HISTORY:  Past Medical History:  Diagnosis Date   BPH (benign prostatic hypertrophy)    CHF (congestive heart failure) ([place])    a. EF at 45-50% in 2016 b. EF at 40-45% by repeat echo in 29/5621   Chronic systolic heart failure ([place])    Complete heart block [facility])    Medtronic pacemaker 04/2021 - Dr. Lovena Le   Coronary artery disease    a. Multivessel status post CABG 8/06, LIMA-LAD, SVG-CFX, SVG-distal RCA b. 12/2020: cath showing 3/3 patent grafts with 80% stenosis along LPAV prior to bifurication and medical management recommended.   Essential hypertension    Hyperlipidemia    Ischemic cardiomyopathy    Myelodysplasia, low grade ([place]) 11/27/2015   Nephrolithiasis    Prostate cancer ([place])    Vitamin B 12 deficiency 07/27/2016   Past  Surgical History:  Procedure Laterality Date   CARDIAC DEFIBRILLATOR PLACEMENT     COLONOSCOPY     COLONOSCOPY N/A 08/19/2013   Procedure: COLONOSCOPY;  Surgeon: Rogene Houston, MD;  Location: [facility]ITE;  Service: Endoscopy;  Laterality: N/A;  52   CORONARY ARTERY BYPASS GRAFT     8/06 with left anterior descending artery, saphenous vein graft to circumflex, saphenous vein graft to distal right coronary artery   CYSTOSCOPY/URETEROSCOPY/HOLMIUM LASER/STENT PLACEMENT Right 11/07/2020   Procedure: CYSTOSCOPY/URETEROSCOPY/HOLMIUM LASER/STENT PLACEMENT;  Surgeon: Alexis Frock, MD;  Location: [facility] ORS;  Service: Urology;  Laterality: Right;   HERNIA REPAIR     LEFT HEART CATH AND CORS/GRAFTS ANGIOGRAPHY N/A 12/19/2020   Procedure: LEFT HEART CATH AND CORS/GRAFTS ANGIOGRAPHY;  Surgeon: Leonie Man, MD;  Location: [place] CV LAB;  Service: Cardiovascular;  Laterality: N/A;   PACEMAKER IMPLANT N/A 04/24/2021   Procedure: PACEMAKER IMPLANT;  Surgeon: Evans Lance, MD;  Location: [place] CV LAB;  Service: Cardiovascular;  Laterality: N/A;   POLYPECTOMY     PROSTATE SURGERY     TEMPORARY PACEMAKER N/A 04/21/2021   Procedure: TEMPORARY PACEMAKER;  Surgeon: Martinique, Peter M, MD;  Location: [place] CV LAB;  Service: Cardiovascular;  Laterality: N/A;    SOCIAL HISTORY:  Social History   Socioeconomic History   Marital status: Married    Spouse name: Not on file   Number of children: Not on file   Years of education: Not on  file   Highest education level: Not on file  Occupational History   Occupation: Retired    Fish farm manager: RETIRED  Tobacco Use   Smoking status: Former    Packs/day: 0.25    Years: 44.00    Total pack years: 11.00    Types: Cigarettes    Quit date: 05/14/1992    Years since quitting: 29.5   Smokeless tobacco: Never  Vaping Use   Vaping Use: Never used  Substance and Sexual Activity   Alcohol use: No   Drug use: No   Sexual activity: Not on file     Comment: married 48 years  Other Topics Concern   Not on file  Social History Narrative   Not on file   Social Determinants of Health   Financial Resource Strain: Not on file  Food Insecurity: Not on file  Transportation Needs: Not on file  Physical Activity: Not on file  Stress: Not on file  Social Connections: Not on file  Intimate Partner Violence: Not on file    FAMILY HISTORY:  Family History  Problem Relation Age of Onset   Colon cancer Father    Coronary artery disease Other     CURRENT MEDICATIONS:  Current Outpatient Medications  Medication Sig Dispense Refill   acetaminophen (TYLENOL) 500 MG tablet Take 1 tablet (500 mg total) by mouth every 6 (six) hours as needed for mild pain or fever. 30 tablet 0   acetaminophen (TYLENOL) 500 MG tablet Take 500 mg by mouth every 6 (six) hours as needed.     Ascorbic Acid (VITAMIN C) 500 MG CAPS Take 500 mg by mouth daily.     aspirin EC 81 MG tablet Take 81 mg by mouth daily. Swallow whole.     carvedilol (COREG) 12.5 MG tablet Take 1 tablet (12.5 mg total) by mouth 2 (two) times daily with a meal. 180 tablet 3   Cholecalciferol (VITAMIN D3) 1000 UNITS CAPS Take 1 capsule by mouth daily.     diphenhydrAMINE (BENADRYL) 25 MG tablet Take 25 mg by mouth at bedtime as needed for sleep.     furosemide (LASIX) 40 MG tablet TAKE 1 TABLET(40 MG) BY MOUTH DAILY 90 tablet 3   Melatonin 5 MG CAPS Take 5 mg by mouth as needed.     nitroGLYCERIN (NITROSTAT) 0.4 MG SL tablet Place 1 tablet (0.4 mg total) under the tongue every 5 (five) minutes as needed. 25 tablet 3   potassium chloride (KLOR-CON) 10 MEQ tablet Take 1 tablet (10 mEq total) by mouth daily. 30 tablet 0   sacubitril-valsartan (ENTRESTO) 24-26 MG Take 1 tablet by mouth 2 (two) times daily. 60 tablet 6   simvastatin (ZOCOR) 40 MG tablet TAKE 1 TABLET BY MOUTH EVERY DAY IN THE EVENING 90 tablet 2   spironolactone (ALDACTONE) 25 MG tablet Take 0.5 tablets (12.5 mg total) by mouth  daily. 45 tablet 3   vitamin C (ASCORBIC ACID) 500 MG tablet Take 500 mg by mouth daily.       Vitamin D3 (VITAMIN D) 25 MCG tablet Take 25 mcg by mouth daily.     zinc gluconate 50 MG tablet Take 50 mg by mouth daily.     No current facility-administered medications for this visit.    ALLERGIES:  Allergies  Allergen Reactions   Codeine Nausea And Vomiting    PHYSICAL EXAM:  Performance status (ECOG): 2 - Symptomatic, <50% confined to bed  There were no vitals filed for this visit. Wt Readings from  Last 3 Encounters:  11/27/21 144 lb 6.4 oz (65.5 kg)  10/19/21 149 lb (67.6 kg)  07/31/21 156 lb 1.4 oz (70.8 kg)   Physical Exam Vitals reviewed.  Constitutional:      Appearance: Normal appearance.  Cardiovascular:     Rate and Rhythm: Normal rate and regular rhythm.     Pulses: Normal pulses.     Heart sounds: Normal heart sounds.  Pulmonary:     Effort: Pulmonary effort is normal.     Breath sounds: Normal breath sounds.  Neurological:     General: No focal deficit present.     Mental Status: He is alert and oriented to person, place, and time.  Psychiatric:        Mood and Affect: Mood normal.        Behavior: Behavior normal.      LABORATORY DATA:  I have reviewed the labs as listed.     Latest Ref Rng & Units 07/24/2021    1:44 PM 06/22/2021    1:58 PM 04/25/2021    5:50 AM  CBC  WBC 4.0 - 10.5 K/uL 4.0  3.6  6.9   Hemoglobin 13.0 - 17.0 g/dL 12.1  12.3  10.8   Hematocrit 39.0 - 52.0 % 38.7  38.7  34.2   Platelets 150 - 400 K/uL 133  139  165       Latest Ref Rng & Units 08/01/2021   11:04 AM 07/24/2021    1:44 PM 07/18/2021   10:43 AM  CMP  Glucose 70 - 99 mg/dL 119  146  135   BUN 8 - 23 mg/dL '25  25  30   ' Creatinine 0.61 - 1.24 mg/dL 1.15  1.52  1.25   Sodium 135 - 145 mmol/L 141  140  140   Potassium 3.5 - 5.1 mmol/L 4.4  4.8  4.4   Chloride 98 - 111 mmol/L 105  105  106   CO2 22 - 32 mmol/L '29  27  28   ' Calcium 8.9 - 10.3 mg/dL 9.0  8.7  8.7    Total Protein 6.5 - 8.1 g/dL  6.3    Total Bilirubin 0.3 - 1.2 mg/dL  0.9    Alkaline Phos 38 - 126 U/L  52    AST 15 - 41 U/L  11    ALT 0 - 44 U/L  8      DIAGNOSTIC IMAGING:  I have independently reviewed the scans and discussed with the patient. NM PET Image Initial (PI) Skull Base To Thigh (F-18 FDG)  Result Date: 12/02/2021 CLINICAL DATA:  Initial treatment strategy for Unintentional weight loss. Left adrenal gland mass. EXAM: NUCLEAR MEDICINE PET SKULL BASE TO THIGH TECHNIQUE: 7.61 mCi F-18 FDG was injected intravenously. Full-ring PET imaging was performed from the skull base to thigh after the radiotracer. CT data was obtained and used for attenuation correction and anatomic localization. Fasting blood glucose: 147 mg/dl COMPARISON:  CT scan 11/04/2020 FINDINGS: Mediastinal blood pool activity: SUV max 1.36 Liver activity: SUV max NA NECK: No hypermetabolic lymph nodes in the neck. Incidental CT findings: Bilateral carotid artery calcifications. CHEST: No hypermetabolic mediastinal or hilar nodes. No suspicious pulmonary nodules on the CT scan. No enlarged or hypermetabolic supraclavicular or axillary lymph nodes. Incidental CT findings: Small bilateral pleural effusions, right larger than left. Extensive aortic and coronary artery calcifications. Pacer wires in good position without complicating features. Surgical changes from bypass surgery. ABDOMEN/PELVIS: The left adrenal gland mass measures 8.0  x 7.7 cm. On the prior CT scan from 1 year ago it measured 5.5 x 4.8 cm. The mass is hypermetabolic with SUV max of 1.09. The right adrenal gland is normal. No hepatic pancreatic or renal lesions are identified. No enlarged or hypermetabolic abdominal or pelvic lymph nodes. Incidental CT findings: Advanced atherosclerotic calcifications involving the aorta and branch vessels but no aneurysm. Calcified gallstones are noted the gallbladder. Large right renal cysts. Pelvic fluid is noted of uncertain  significance or etiology. Diffuse body wall edema is noted. SKELETON: There are numerous sclerotic bone lesions, mainly in the ribs. But no hypermetabolism or destructive bony changes. Incidental CT findings: none IMPRESSION: 1. Large hypermetabolic left adrenal gland mass likely a primary adrenal gland tumor such as adrenal cortical carcinoma. No findings to suggest this is metastatic disease as no other primary neoplasm is demonstrated in the neck, chest, abdomen or pelvis. 2. Stable scattered sclerotic bone lesions mainly in the ribs but no hypermetabolism. 3. Advanced vascular disease. 4. Bilateral pleural effusions, right larger than left. There is also diffuse body wall edema and free pelvic fluid suggesting anasarca. Electronically Signed   By: Marijo Sanes M.D.   On: 12/02/2021 11:13     ASSESSMENT:  1.  Left adrenal mass - During hospitalization last summer (June 2022), CT abdomen/pelvis showed 5.5 cm left adrenal mass with imaging features suspicious for adrenal carcinoma or metastases.  Oncological work-up was recommended by radiologist, urologist, and hospitalist team, but results were never relayed to oncology clinic until July 2023 when patient's nephrologist (Dr. Theador Hawthorne) brought it to our attention. - Patient denies any abdominal pain, back pain, or flank pain.  He denies any early satiety. - Medical records show approximate 20 pound weight loss in the past 6 months.  Slight decrease in appetite. - Occasional night sweats for the past 4 to 5 months.  No unexplained fevers or chills. - Recently diagnosed with diabetes. - Patient is still able to drive.  He ambulates with a cane, and helps his wife with household chores. - He denies any cancer in his siblings or parents.    2.  Low-grade MDS - Bone marrow biopsy on 10/27/2015 showed hypercellular marrow with mild dyserythropoiesis and dismegakaryocytes.  Chromosome analysis was normal.  FISH panel was normal.  Flow cytometry on peripheral  blood did not show any monoclonal B cell population. - He has normocytic anemia which is stable between 11 and 12.  - He also has moderate thrombocytopenia which is stable between 80 and 90,000. - Most recent labs (07/24/2021):  Hgb 12.1, WBC 4.0 with ANC 1.5, platelets 133   PLAN:  1.  Left adrenal mass: - PET scan (12/08/2021): Left adrenal mass 8 x 7.7 cm with SUV 5.73.  CT scan previously measured 5.5 x 4.8 cm 1 year ago.  Numerous sclerotic bone lesions mainly in the ribs with no hypermetabolic some.  No evidence of metastatic disease. - Plasma metanephrines and normetanephrine's are normal.  Cortisol was normal.  DHEA was more than 1000. - We talked about surgical option.  Given his advanced age, he declined any form of surgery. - We will reach out to IR to see if there is any local treatment options like ablation feasible although the size of the tumor might preclude it. - We also talked about radiation therapy if IR options are not available. - I will reach out to my IR colleagues and seek their opinion.    2.  Low-grade MDS: - Labs from  10/11/2021 with hemoglobin 12.5, white count 4.4 and platelet count 113. - Counts have been stable.  We will plan to repeat them in 3 months.    Orders placed this encounter:  No orders of the defined types were placed in this encounter.    Derek Jack, MD [place] [phone]   I, Thana Ates, am acting as a scribe for Dr. Derek Jack.  I, Derek Jack MD, have reviewed the above documentation for accuracy and completeness, and I agree with the above.

## 2021-12-05 NOTE — Patient Instructions (Addendum)
[place] at [facility] Discharge Instructions  You were seen and examined today by Dr. Delton Coombes.  Dr. Delton Coombes reviewed the results of your recent PET scan which did reveal a mass concerning for cancer in your left adrenal gland. There is no concern for cancer outside of the adrenal gland. This is great news.  To confirm it is indeed cancer, Dr. Delton Coombes has recommended a biopsy. This can be done by the radiologist in [place] at either [facility] or [facility]. Following the biopsy, the typical course of treatment would be specialized radiation known as SBRT. This is a focused radiation that targets just the area of concern in 1 to 5 doses of radiation typically. This radiation is done at [facility].  Follow-up as scheduled.  Thank you for choosing [place] at [facility] to provide your oncology and hematology care.  To afford each patient quality time with our provider, please arrive at least 15 minutes before your scheduled appointment time.   If you have a lab appointment with the [place] please come in thru the Main Entrance and check in at the main information desk.  You need to re-schedule your appointment should you arrive 10 or more minutes late.  We strive to give you quality time with our providers, and arriving late affects you and other patients whose appointments are after yours.  Also, if you no show three or more times for appointments you may be dismissed from the clinic at the providers discretion.     Again, thank you for choosing [facility].  Our hope is that these requests will decrease the amount of time that you wait before being seen by our physicians.       _____________________________________________________________  Should you have questions after your visit to [facility], please contact our office at [phone] and follow the prompts.  Our office hours are  8:00 a.m. and 4:30 p.m. Monday - Friday.  Please note that voicemails left after 4:00 p.m. may not be returned until the following business day.  We are closed weekends and major holidays.  You do have access to a nurse 24-7, just call the main number to the clinic [phone] and do not press any options, hold on the line and a nurse will answer the phone.    For prescription refill requests, have your pharmacy contact our office and allow 72 hours.

## 2021-12-06 ENCOUNTER — Encounter (HOSPITAL_COMMUNITY): Payer: Self-pay

## 2021-12-06 ENCOUNTER — Telehealth: Payer: Self-pay | Admitting: Radiation Oncology

## 2021-12-06 NOTE — Progress Notes (Signed)
Call received from patient and his wife questioning RadOnc referral. Patient states that he would like surgery. Reviewed Dr. Tomie China note from yesterday with the patient and his wife and discussed that he was referred for IR evaluation, not surgical evaluation, but that per Dr. Delton Coombes he has talked with Dr. Kathlene Cote who did not feel that an ablation was a good option. Patient referred to RadOnc as discussed with Dr. Delton Coombes. Patient and wife agreeable to consultation with RadOnc. I have reached out to [employer] at [facility] for scheduling.

## 2021-12-06 NOTE — Addendum Note (Signed)
Addended by: Joie Bimler on: 12/06/2021 08:14 AM   Modules accepted: Orders

## 2021-12-06 NOTE — Telephone Encounter (Signed)
Spoke to pt's wife who stated pt is not interested in XRT or traveling to [place] daily for any type of tx. Pt is wanting surgery. Referral will be cx at this time. [name] advised to let Dr. Marthann Schiller team know if they change their mind. Referral cx.

## 2021-12-06 NOTE — Progress Notes (Unsigned)
Luke Edouard, MD  Donita Brooks D OK for CT guided biopsy of left adrenal mass.   GY

## 2021-12-07 ENCOUNTER — Encounter: Payer: Self-pay | Admitting: Internal Medicine

## 2021-12-07 NOTE — Progress Notes (Signed)
TO BE COMPLETED BY RADIATION ONCOLOGIST OFFICE:   Patient Name: [name]   Date of Birth: 1927/03/04   Radiation Oncologist: Dr. Tyler Pita   Site to be Treated: Left Adrenal Gland   Will x-rays >10 MV be used? No   Will the radiation be >10 cm from the device? Yes   Planned Treatment Start Date: 1-2 Weeks   TO BE COMPLETED BY CARDIOLOGIST OFFICE:   Device Information:  Pacemaker '[x]'$      ICD '[]'$    Brand: Medtronic: [phone] Model #: Medtronic [id number] Santina Evans DR MRI    Serial Number: [id number]     Date of Placement: 04/24/2021  Site of Placement: Left Chest  Remote Device Check--Frequency: Q 3 months   Last Check: 10/26/21 remote, 07/25/21 in clinic  Is the Patient Pacer Dependent?:  Yes '[x]'$   No '[]'$   Does cardiologist request Radiation Oncology to schedule device testing by vendor for the following:  Prior to the Initiation of Treatments?  Yes '[]'$  No '[x]'$  During Treatments?  Yes '[]'$  No '[x]'$  Post Radiation Treatments?  Yes '[]'$  No '[x]'$   Is device monitoring necessary by vendor/cardiologist team during treatments?  Yes '[]'$   No '[x]'$   Is cardiac monitoring by Radiation Oncology nursing necessary during treatments? Yes '[x]'$   No '[]'$   Do you recommend device be relocated prior to Radiation Treatment? Yes '[]'$   No '[x]'$   **PLEASE LIST ANY NOTES OR SPECIAL REQUESTS:       CARDIOLOGIST SIGNATURE:  Dr. Cristopher Peru Per [facility] Standing Orders, Willadean Carol  12/07/2021 10:51 AM  **Please route completed form back to Radiation Oncology Nursing and "[place]", OR send an update if there will be a delay in having form completed by expected start date.  **Call [phone] if you have any questions or do not get an in-basket response from a Radiation Oncology staff member

## 2021-12-07 NOTE — Progress Notes (Signed)
Thoracic Location of Tumor / Histology:  Left Adrenal Mass   11/27/2021 Dr. Delton Coombes NM PET Image Initial Skull Base To Thigh CLINICAL DATA:  Initial treatment strategy for Unintentional weight loss. Left adrenal gland mass.  FINDINGS: Mediastinal blood pool activity: SUV max 1.36   Liver activity: SUV max NA   NECK: No hypermetabolic lymph nodes in the neck. Incidental CT findings: Bilateral carotid artery calcifications.   CHEST: No hypermetabolic mediastinal or hilar nodes. No suspicious pulmonary nodules on the CT scan.   No enlarged or hypermetabolic supraclavicular or axillary lymph nodes.   Incidental CT findings: Small bilateral pleural effusions, right larger than left. Extensive aortic and coronary artery calcifications. Pacer wires in good position without complicating features. Surgical changes from bypass surgery.   ABDOMEN/PELVIS: The left adrenal gland mass measures 8.0 x 7.7 cm.  On the prior CT scan from 1 year ago it measured 5.5 x 4.8 cm. The mass is hypermetabolic with SUV max of 5.45. The right adrenal gland is normal. No hepatic pancreatic or renal lesions are identified. No enlarged or hypermetabolic abdominal or pelvic lymph nodes.   Incidental CT findings: Advanced atherosclerotic calcifications involving the aorta and branch vessels but no aneurysm.   Calcified gallstones are noted the gallbladder.   Large right renal cysts.   Pelvic fluid is noted of uncertain significance or etiology.   Diffuse body wall edema is noted.   SKELETON: There are numerous sclerotic bone lesions, mainly in the ribs. But no hypermetabolism or destructive bony changes.   Incidental CT findings: none   IMPRESSION: 1. Large hypermetabolic left adrenal gland mass likely a primary adrenal gland tumor such as adrenal cortical carcinoma. No findings to suggest this is metastatic disease as no other primary neoplasm is demonstrated in the neck, chest, abdomen or pelvis. 2. Stable  scattered sclerotic bone lesions mainly in the ribs but no hypermetabolism. 3. Advanced vascular disease. 4. Bilateral pleural effusions, right larger than left. There is also diffuse body wall edema and free pelvic fluid suggesting anasarca.   11/06/6387 Dr. Roxanne Mins CT Abdomen Pelvis with Contrast CLINICAL DATA:  Right lower quadrant abdominal pain. Clinical suspicion for appendicitis.  FINDINGS: Lower chest: Enlarged heart. Atheromatous calcifications, including the coronary arteries and aorta. Post median sternotomy changes.  Right ventricular AICD lead. Small amount of interval atelectasis or scarring in the lingula.   Hepatobiliary: Multiple calcified gallstones in the gallbladder measuring up to 1.3 cm in maximum diameter each. No gallbladder wall thickening or pericholecystic fluid. Unremarkable liver.   Pancreas: Moderate diffuse pancreatic atrophy without significant change. Duodenal diverticulum protruding into the head of the pancreas.   Spleen: Normal in size without focal abnormality.   Adrenals/Urinary Tract: Interval heterogeneous, lobulated in partially calcified left adrenal mass measuring 5.5 x 4.8 cm on image number 24/2 and abutting a partially calcified, tortuous splenic artery on that image. This measures 4.3 cm in length on coronal image number 59/5. Normal appearing right adrenal gland.  Multiple bilateral renal cysts are again demonstrated. The previously demonstrated right renal calculus is no longer seen in the kidney. Interval mild-to-moderate dilatation of the right renal collecting system and ureter the level of a 4 mm calculus in the distal ureter, 8 mm proximal to the ureterovesical junction.  Unremarkable left ureter and urinary bladder.   Stomach/Bowel: Multiple sigmoid colon diverticula without evidence of diverticulitis. Diverticulum arising from the medial aspect of the 2nd portion of the duodenum and protruding into the head of the pancreas. No evidence of  appendicitis. Unremarkable stomach.   Vascular/Lymphatic: Atheromatous arterial calcifications without aneurysm. No enlarged lymph nodes.   Reproductive: Prostate is unremarkable.   Other: Small bilateral inguinal hernias containing fat.   Musculoskeletal: Lumbar and lower thoracic spine degenerative changes. Mild bilateral hip degenerative changes. Old right posterolateral 9th rib fracture with nonunion.   IMPRESSION: 1. Interval 5.5 cm left adrenal mass with imaging features suspicious for an adrenal carcinoma or metastasis. Oncological workup is recommended. 2. 4 mm distal right ureteral calculus causing mild-to-moderate right hydronephrosis and hydroureter. 3. Colonic diverticulosis without evidence of diverticulitis. 4. Cholelithiasis without evidence cholecystitis.    04/14/2021 Dr. Carles Collet US Renal CLINICAL DATA:  Renal dysfunction   FINDINGS: Right Kidney: Renal measurements: 9.8 x 5.3 x 3.9 cm = volume: 105.1 mL. There is no hydronephrosis. There is interval resolution of right hydronephrosis since the CT done on 11/04/2020. There are multiple smooth marginated anechoic lesions in the right kidney largest measuring 4.9 cm in the lower pole.   Left Kidney: Renal measurements: 10.6 x 4.6 x 5.6 cm = volume: 141.4 mL. There is mild lobulation in the margin. Technologist observed 7 mm hyperechoic structure in the upper pole which was not distinctly seen in the previous CT and may be a partial volume averaging artifact. There is possible 10 mm cyst in the lower pole. Left adrenal is not visualized in the submitted images.   Bladder: Urinary bladder is not distended.  None.   IMPRESSION: There is no hydronephrosis.  Bilateral renal cysts.   01/19/2017 Dr. Luan Pulling CT Abdomen Pelvis with Contrast CLINICAL DATA:  Right-sided abdominal pain over the last 2 weeks.  Leukopenia. History of prostate cancer.   FINDINGS: Lower chest: Gynecomastia. Lung bases are clear. No pleural or  pericardial fluid.   Hepatobiliary: Normal appearance of the liver parenchyma. Small group of calcified stones dependent within the gallbladder. No CT evidence of cholecystitis or obstruction.   Pancreas: Normal.  Small duodenum diverticulum of no significance.   Spleen: Normal   Adrenals/Urinary Tract: Adrenal glands are normal. Moderate bilateral renal atrophy. Simple appearing cysts of both kidneys, larger on the right. Nonobstructing 4 mm stone lower pole right kidney. No stone in either ureter. Bladder appears normal.   Stomach/Bowel: Diverticulosis without CT evidence of diverticulitis.  No other bowel finding.   Vascular/Lymphatic: Aortic atherosclerosis. No aneurysm. IVC is normal. No retroperitoneal adenopathy.   Reproductive: Normal   Other: No free fluid or air.   Musculoskeletal: Chronic spinal degenerative changes.   IMPRESSION: No acute finding. No cause of right-sided abdominal pain identified.  Patient does have several small gallstones in the gallbladder but there is no CT evidence of cholecystitis or obstruction.  4 mm nonobstructing stone in the lower pole the right kidney. Aortic atherosclerosis.   Tobacco/Marijuana/Snuff/ETOH use: Quit smoking 05/1992, no drug, alcohol, or smokeless.  Past/Anticipated interventions by cardiothoracic surgery, if any: NA  Past/Anticipated interventions by medical oncology, if any:  NA  Signs/Symptoms Weight changes, if any: Yes, 30 lbs in six months.  Eating less than before. Respiratory complaints, if any: No Hemoptysis, if any: No Pain issues, if any:  0/10  SAFETY ISSUES: Prior radiation? No Pacemaker/ICD?  Yes, Dr. Lovena Le  Possible current pregnancy? Male Is the patient on methotrexate? No  Current Complaints / other details:

## 2021-12-14 ENCOUNTER — Ambulatory Visit
Admission: RE | Admit: 2021-12-14 | Discharge: 2021-12-14 | Disposition: A | Discharge status: Home / Self Care | Payer: Medicare Other | Source: Ambulatory Visit | Attending: Radiation Oncology | Admitting: Radiation Oncology

## 2021-12-14 ENCOUNTER — Other Ambulatory Visit: Payer: Self-pay

## 2021-12-14 VITALS — BP 143/64 | HR 60 | Temp 97.1°F | Resp 18 | Ht 64.0 in | Wt 153.2 lb

## 2021-12-14 DIAGNOSIS — R634 Abnormal weight loss: Secondary | ICD-10-CM | POA: Insufficient documentation

## 2021-12-14 DIAGNOSIS — C7402 Malignant neoplasm of cortex of left adrenal gland: Secondary | ICD-10-CM | POA: Diagnosis not present

## 2021-12-14 DIAGNOSIS — Z79899 Other long term (current) drug therapy: Secondary | ICD-10-CM | POA: Insufficient documentation

## 2021-12-14 DIAGNOSIS — Z7982 Long term (current) use of aspirin: Secondary | ICD-10-CM | POA: Diagnosis not present

## 2021-12-14 DIAGNOSIS — I110 Hypertensive heart disease with heart failure: Secondary | ICD-10-CM | POA: Insufficient documentation

## 2021-12-14 DIAGNOSIS — E785 Hyperlipidemia, unspecified: Secondary | ICD-10-CM | POA: Diagnosis not present

## 2021-12-14 DIAGNOSIS — J90 Pleural effusion, not elsewhere classified: Secondary | ICD-10-CM | POA: Diagnosis not present

## 2021-12-14 DIAGNOSIS — N281 Cyst of kidney, acquired: Secondary | ICD-10-CM | POA: Diagnosis not present

## 2021-12-14 DIAGNOSIS — I2510 Atherosclerotic heart disease of native coronary artery without angina pectoris: Secondary | ICD-10-CM | POA: Diagnosis not present

## 2021-12-14 DIAGNOSIS — N400 Enlarged prostate without lower urinary tract symptoms: Secondary | ICD-10-CM | POA: Insufficient documentation

## 2021-12-14 DIAGNOSIS — I255 Ischemic cardiomyopathy: Secondary | ICD-10-CM | POA: Insufficient documentation

## 2021-12-14 DIAGNOSIS — C7492 Malignant neoplasm of unspecified part of left adrenal gland: Secondary | ICD-10-CM

## 2021-12-14 NOTE — Progress Notes (Signed)
Radiation Oncology         [phone]) [phone] ________________________________  Initial outpatient Consultation  Name: [name] MRN: [id number]  Date of Service: 12/14/2021 DOB: Jan 28, 1927  ZE:[id number], Edwinna Areola, MD  Derek Jack, MD   REFERRING PHYSICIAN: Derek Jack, MD  DIAGNOSIS: 88 year old male with an 8 cm left adrenal mass, putative adrenal carcinoma.    ICD-10-CM   1. Malignant neoplasm of cortex of left adrenal gland (HCC)  C74.02     2. Adrenal carcinoma, left (HCC)  C74.92       HISTORY OF PRESENT ILLNESS: [name] is a 88 y.o. male seen at the request of Dr. Delton Coombes.  He is well-known to Dr. Delton Coombes, followed annually for low-grade MDS.  He was initially noted to have a 5.5 cm left adrenal mass with features suspicious for adrenal carcinoma or metastasis on CT A/P performed 11/04/2020 during hospital admission for right lower quadrant abdominal pain.  The recommendation was for oncology work-up but unfortunately, oncology was not made aware of the situation until July 2023.  He had a recent PET scan on 11/30/2021 which showed the hypermetabolic left adrenal mass had increased in size, now measuring 8 cm but without other suspicious hypermetabolic lesion.  He reports a 20 pound weight loss over the last 6 months as well as decreased energy, decreased appetite and night sweats but denies any abdominal or back pain.  He is not felt to be an ideal surgical candidate given his advanced age and therefore, has been kindly referred today for discussion of radiation treatment options.  Of note, he does have a history of prostate cancer and met with Dr. Valere Dross in 2006 but ultimately elected to proceed with cryotherapy under the care and direction of Dr. Rosana Hoes for treatment of the prostate cancer and his PSA has remained well controlled, most recently at 0.1 in 2022.  PREVIOUS RADIATION THERAPY: No  PAST MEDICAL HISTORY:  Past Medical History:  Diagnosis Date    BPH (benign prostatic hypertrophy)    CHF (congestive heart failure) ([place])    a. EF at 45-50% in 2016 b. EF at 40-45% by repeat echo in 30/0762   Chronic systolic heart failure ([place])    Complete heart block [facility])    [employer] pacemaker 04/2021 - Dr. Lovena Le   Coronary artery disease    a. Multivessel status post CABG 8/06, LIMA-LAD, SVG-CFX, SVG-distal RCA b. 12/2020: cath showing 3/3 patent grafts with 80% stenosis along LPAV prior to bifurication and medical management recommended.   Essential hypertension    Hyperlipidemia    Ischemic cardiomyopathy    Myelodysplasia, low grade ([place]) 11/27/2015   Nephrolithiasis    Prostate cancer ([place])    Vitamin B 12 deficiency 07/27/2016      PAST SURGICAL HISTORY: Past Surgical History:  Procedure Laterality Date   CARDIAC DEFIBRILLATOR PLACEMENT     COLONOSCOPY     COLONOSCOPY N/A 08/19/2013   Procedure: COLONOSCOPY;  Surgeon: Rogene Houston, MD;  Location: [facility];  Service: Endoscopy;  Laterality: N/A;  42   CORONARY ARTERY BYPASS GRAFT     8/06 with left anterior descending artery, saphenous vein graft to circumflex, saphenous vein graft to distal right coronary artery   CYSTOSCOPY/URETEROSCOPY/HOLMIUM LASER/STENT PLACEMENT Right 11/07/2020   Procedure: CYSTOSCOPY/URETEROSCOPY/HOLMIUM LASER/STENT PLACEMENT;  Surgeon: Alexis Frock, MD;  Location: [facility];  Service: Urology;  Laterality: Right;   HERNIA REPAIR     LEFT HEART CATH AND CORS/GRAFTS ANGIOGRAPHY N/A 12/19/2020   Procedure:  LEFT HEART CATH AND CORS/GRAFTS ANGIOGRAPHY;  Surgeon: Leonie Man, MD;  Location: [place] [facility];  Service: Cardiovascular;  Laterality: N/A;   PACEMAKER IMPLANT N/A 04/24/2021   Procedure: PACEMAKER IMPLANT;  Surgeon: Evans Lance, MD;  Location: [place] [facility];  Service: Cardiovascular;  Laterality: N/A;   POLYPECTOMY     PROSTATE SURGERY     TEMPORARY PACEMAKER N/A 04/21/2021   Procedure: TEMPORARY PACEMAKER;  Surgeon: Martinique, Peter M,  MD;  Location: [place] [facility];  Service: Cardiovascular;  Laterality: N/A;    FAMILY HISTORY:  Family History  Problem Relation Age of Onset   Colon cancer Father    Coronary artery disease Other     SOCIAL HISTORY:  Social History   Socioeconomic History   Marital status: Married    Spouse name: Not on file   Number of children: Not on file   Years of education: Not on file   Highest education level: Not on file  Occupational History   Occupation: Retired    Fish farm manager: RETIRED  Tobacco Use   Smoking status: Former    Packs/day: 0.25    Years: 44.00    Total pack years: 11.00    Types: Cigarettes    Quit date: 05/14/1992    Years since quitting: 29.6   Smokeless tobacco: Never  Vaping Use   Vaping Use: Never used  Substance and Sexual Activity   Alcohol use: No   Drug use: No   Sexual activity: Not on file    Comment: married 34 years  Other Topics Concern   Not on file  Social History Narrative   Not on file   Social Determinants of Health   Financial Resource Strain: Not on file  Food Insecurity: Not on file  Transportation Needs: Not on file  Physical Activity: Not on file  Stress: Not on file  Social Connections: Not on file  Intimate Partner Violence: Not on file    ALLERGIES: Codeine  MEDICATIONS:  Current Outpatient Medications  Medication Sig Dispense Refill   acetaminophen (TYLENOL) 500 MG tablet Take 500 mg by mouth every 6 (six) hours as needed.     Ascorbic Acid (VITAMIN C) 500 MG CAPS Take 500 mg by mouth daily.     aspirin EC 81 MG tablet Take 81 mg by mouth daily. Swallow whole.     carvedilol (COREG) 12.5 MG tablet Take 1 tablet (12.5 mg total) by mouth 2 (two) times daily with a meal. 180 tablet 3   Cholecalciferol (VITAMIN D3) 1000 UNITS CAPS Take 1 capsule by mouth daily.     diphenhydrAMINE (BENADRYL) 25 MG tablet Take 25 mg by mouth at bedtime as needed for sleep.     furosemide (LASIX) 40 MG tablet TAKE 1 TABLET(40 MG) BY MOUTH  DAILY 90 tablet 3   Melatonin 5 MG CAPS Take 5 mg by mouth as needed.     nitroGLYCERIN (NITROSTAT) 0.4 MG SL tablet Place 1 tablet (0.4 mg total) under the tongue every 5 (five) minutes as needed. 25 tablet 3   potassium chloride (KLOR-CON) 10 MEQ tablet Take 1 tablet (10 mEq total) by mouth daily. 30 tablet 0   sacubitril-valsartan (ENTRESTO) 24-26 MG Take 1 tablet by mouth 2 (two) times daily. 60 tablet 6   simvastatin (ZOCOR) 40 MG tablet TAKE 1 TABLET BY MOUTH EVERY DAY IN THE EVENING 90 tablet 2   spironolactone (ALDACTONE) 25 MG tablet Take 0.5 tablets (12.5 mg total) by mouth daily.  45 tablet 3   Vitamin D3 (VITAMIN D) 25 MCG tablet Take 25 mcg by mouth daily.     zinc gluconate 50 MG tablet Take 50 mg by mouth daily.     No current facility-administered medications for this encounter.    REVIEW OF SYSTEMS:  On review of systems, the patient reports that he is doing well overall.  He denies any chest pain, shortness of breath, cough, fevers, or chills but has had some night sweats and unintended weight loss of approximately 20 pounds over the last 6 months.  He denies any bowel or bladder disturbances, and denies abdominal pain, nausea or vomiting.  He has had some decreased appetite and decreased energy.  He denies any new musculoskeletal or joint aches or pains. A complete review of systems is obtained and is otherwise negative.    PHYSICAL EXAM:  Wt Readings from Last 3 Encounters:  12/14/21 153 lb 4 oz (69.5 kg)  12/05/21 154 lb 3.2 oz (69.9 kg)  11/27/21 144 lb 6.4 oz (65.5 kg)   Temp Readings from Last 3 Encounters:  12/14/21 (!) 97.1 F (36.2 C) (Temporal)  12/05/21 97.6 F (36.4 C) (Tympanic)  11/27/21 (!) 97.3 F (36.3 C) (Oral)   BP Readings from Last 3 Encounters:  12/14/21 (!) 143/64  12/05/21 140/63  11/27/21 124/61   Pulse Readings from Last 3 Encounters:  12/14/21 60  12/05/21 62  11/27/21 60   Pain Assessment Pain Score: 0-No pain/10  In general this  is a well appearing Caucasian male in no acute distress.  He's alert and oriented x4 and appropriate throughout the examination. Cardiopulmonary assessment is negative for acute distress and he exhibits normal effort.   KPS = 90  100 - Normal; no complaints; no evidence of disease. 90   - Able to carry on normal activity; minor signs or symptoms of disease. 80   - Normal activity with effort; some signs or symptoms of disease. 79   - Cares for self; unable to carry on normal activity or to do active work. 60   - Requires occasional assistance, but is able to care for most of his personal needs. 50   - Requires considerable assistance and frequent medical care. 66   - Disabled; requires special care and assistance. 35   - Severely disabled; hospital admission is indicated although death not imminent. 52   - Very sick; hospital admission necessary; active supportive treatment necessary. 10   - Moribund; fatal processes progressing rapidly. 0     - Dead  Karnofsky DA, Abelmann [place], Craver LS and Burchenal [facility] [phone]) The use of the nitrogen mustards in the palliative treatment of carcinoma: with particular reference to bronchogenic carcinoma Cancer 1 634-56  LABORATORY DATA:  Lab Results  Component Value Date   WBC 4.0 07/24/2021   HGB 12.1 (L) 07/24/2021   HCT 38.7 (L) 07/24/2021   MCV 95.6 07/24/2021   PLT 133 (L) 07/24/2021   Lab Results  Component Value Date   NA 141 08/01/2021   K 4.4 08/01/2021   CL 105 08/01/2021   CO2 29 08/01/2021   Lab Results  Component Value Date   ALT 8 07/24/2021   AST 11 (L) 07/24/2021   ALKPHOS 52 07/24/2021   BILITOT 0.9 07/24/2021     RADIOGRAPHY: NM PET Image Initial (PI) Skull Base To Thigh (F-18 FDG)  Result Date: 12/02/2021 CLINICAL DATA:  Initial treatment strategy for Unintentional weight loss. Left adrenal gland mass. EXAM: NUCLEAR MEDICINE  PET SKULL BASE TO THIGH TECHNIQUE: 7.61 mCi F-18 FDG was injected intravenously. Full-ring PET  imaging was performed from the skull base to thigh after the radiotracer. CT data was obtained and used for attenuation correction and anatomic localization. Fasting blood glucose: 147 mg/dl COMPARISON:  CT scan 11/04/2020 FINDINGS: Mediastinal blood pool activity: SUV max 1.36 Liver activity: SUV max NA NECK: No hypermetabolic lymph nodes in the neck. Incidental CT findings: Bilateral carotid artery calcifications. CHEST: No hypermetabolic mediastinal or hilar nodes. No suspicious pulmonary nodules on the CT scan. No enlarged or hypermetabolic supraclavicular or axillary lymph nodes. Incidental CT findings: Small bilateral pleural effusions, right larger than left. Extensive aortic and coronary artery calcifications. Pacer wires in good position without complicating features. Surgical changes from bypass surgery. ABDOMEN/PELVIS: The left adrenal gland mass measures 8.0 x 7.7 cm. On the prior CT scan from 1 year ago it measured 5.5 x 4.8 cm. The mass is hypermetabolic with SUV max of 0.63. The right adrenal gland is normal. No hepatic pancreatic or renal lesions are identified. No enlarged or hypermetabolic abdominal or pelvic lymph nodes. Incidental CT findings: Advanced atherosclerotic calcifications involving the aorta and branch vessels but no aneurysm. Calcified gallstones are noted the gallbladder. Large right renal cysts. Pelvic fluid is noted of uncertain significance or etiology. Diffuse body wall edema is noted. SKELETON: There are numerous sclerotic bone lesions, mainly in the ribs. But no hypermetabolism or destructive bony changes. Incidental CT findings: none IMPRESSION: 1. Large hypermetabolic left adrenal gland mass likely a primary adrenal gland tumor such as adrenal cortical carcinoma. No findings to suggest this is metastatic disease as no other primary neoplasm is demonstrated in the neck, chest, abdomen or pelvis. 2. Stable scattered sclerotic bone lesions mainly in the ribs but no  hypermetabolism. 3. Advanced vascular disease. 4. Bilateral pleural effusions, right larger than left. There is also diffuse body wall edema and free pelvic fluid suggesting anasarca. Electronically Signed   By: Marijo Sanes M.D.   On: 12/02/2021 11:13      IMPRESSION/PLAN: 1. 88 y.o. male with an 8 cm left adrenal mass, putative adrenal carcinoma. Today, we talked to the patient and his son, [name], about the findings and workup thus far. We discussed the natural history of adrenal carcinoma and general treatment, highlighting the role of radiotherapy in the management. We discussed the available radiation techniques, and focused on the details and logistics of delivery.  The recommendation is for a 5 fraction course of stereotactic body radiotherapy (SBRT) to the left adrenal mass.  We reviewed the anticipated acute and late sequelae associated with radiation in this setting. The patient was encouraged to ask questions that were answered to his stated satisfaction.  At the conclusion of our conversation, the patient is in agreement to proceed with the recommended 5 fraction course of stereotactic body radiotherapy (SBRT) to the left adrenal mass.  He appears to have a good understanding of his disease and our treatment recommendations which are of curative intent.  He has freely signed written consent to proceed today in the office and a copy of this document will be placed in his medical record.  We will share our discussion with Dr. Delton Coombes and coordinate for CT simulation/treatment planning next week, in anticipation of beginning his treatments in the near future.  We enjoyed meeting him and his son today and look forward to continuing to participate in his care.   We personally spent 60 minutes in this encounter including chart review,  reviewing radiological studies, meeting face-to-face with the patient, entering orders and completing documentation.    Nicholos Johns, PA-C    Tyler Pita, MD  [facility] Direct Dial: [phone]  Fax: [phone] [email or web address]  Skype  LinkedIn

## 2021-12-14 NOTE — Progress Notes (Signed)
Previous Records from the legacy rad-onc EMR, [name] with Dr. Valere Dross, rad-onc on 07/18/04 prior to cryotherapy with Dr. Rosana Hoes  Most recent PSA of 0.1 on 11/08/20

## 2021-12-19 DIAGNOSIS — E782 Mixed hyperlipidemia: Secondary | ICD-10-CM | POA: Diagnosis not present

## 2021-12-19 DIAGNOSIS — E538 Deficiency of other specified B group vitamins: Secondary | ICD-10-CM | POA: Diagnosis not present

## 2021-12-19 DIAGNOSIS — R809 Proteinuria, unspecified: Secondary | ICD-10-CM | POA: Diagnosis not present

## 2021-12-19 DIAGNOSIS — E1169 Type 2 diabetes mellitus with other specified complication: Secondary | ICD-10-CM | POA: Diagnosis not present

## 2021-12-26 DIAGNOSIS — M1A0720 Idiopathic chronic gout, left ankle and foot, without tophus (tophi): Secondary | ICD-10-CM | POA: Diagnosis not present

## 2021-12-26 DIAGNOSIS — N202 Calculus of kidney with calculus of ureter: Secondary | ICD-10-CM | POA: Diagnosis not present

## 2021-12-26 DIAGNOSIS — E782 Mixed hyperlipidemia: Secondary | ICD-10-CM | POA: Diagnosis not present

## 2021-12-26 DIAGNOSIS — N1831 Chronic kidney disease, stage 3a: Secondary | ICD-10-CM | POA: Diagnosis not present

## 2021-12-26 DIAGNOSIS — I5022 Chronic systolic (congestive) heart failure: Secondary | ICD-10-CM | POA: Diagnosis not present

## 2021-12-26 DIAGNOSIS — E538 Deficiency of other specified B group vitamins: Secondary | ICD-10-CM | POA: Diagnosis not present

## 2021-12-26 DIAGNOSIS — D696 Thrombocytopenia, unspecified: Secondary | ICD-10-CM | POA: Diagnosis not present

## 2021-12-26 DIAGNOSIS — J302 Other seasonal allergic rhinitis: Secondary | ICD-10-CM | POA: Diagnosis not present

## 2021-12-26 DIAGNOSIS — Z951 Presence of aortocoronary bypass graft: Secondary | ICD-10-CM | POA: Diagnosis not present

## 2021-12-26 DIAGNOSIS — I2510 Atherosclerotic heart disease of native coronary artery without angina pectoris: Secondary | ICD-10-CM | POA: Diagnosis not present

## 2021-12-26 DIAGNOSIS — D61818 Other pancytopenia: Secondary | ICD-10-CM | POA: Diagnosis not present

## 2021-12-26 DIAGNOSIS — E1169 Type 2 diabetes mellitus with other specified complication: Secondary | ICD-10-CM | POA: Diagnosis not present

## 2021-12-27 ENCOUNTER — Other Ambulatory Visit: Payer: Self-pay

## 2021-12-27 ENCOUNTER — Ambulatory Visit
Admission: RE | Admit: 2021-12-27 | Discharge: 2021-12-27 | Disposition: A | Discharge status: Home / Self Care | Payer: Medicare Other | Source: Ambulatory Visit | Attending: Radiation Oncology | Admitting: Radiation Oncology

## 2021-12-27 DIAGNOSIS — Z510 Encounter for antineoplastic radiation therapy: Secondary | ICD-10-CM | POA: Diagnosis not present

## 2021-12-27 DIAGNOSIS — C7492 Malignant neoplasm of unspecified part of left adrenal gland: Secondary | ICD-10-CM | POA: Insufficient documentation

## 2021-12-27 DIAGNOSIS — C7402 Malignant neoplasm of cortex of left adrenal gland: Secondary | ICD-10-CM | POA: Diagnosis not present

## 2021-12-27 NOTE — Progress Notes (Signed)
  Radiation Oncology         [phone] ________________________________  Name: [name] MRN: [id number]  Date: 12/27/2021  DOB: 1926/09/17  STEREOTACTIC BODY RADIOTHERAPY SIMULATION AND TREATMENT PLANNING NOTE    ICD-10-CM   1. Adrenal carcinoma, left (HCC)  C74.92       DIAGNOSIS:  88 year old male with an 8 cm left adrenal carcinoma.  NARRATIVE:  The patient was brought to the [facility].  Identity was confirmed.  All relevant records and images related to the planned course of therapy were reviewed.  The patient freely provided informed written consent to proceed with treatment after reviewing the details related to the planned course of therapy. The consent form was witnessed and verified by the simulation staff.  Then, the patient was set-up in a stable reproducible  supine position for radiation therapy.  A BodyFix immobilization pillow was fabricated for reproducible positioning.  Surface markings were placed.  The CT images were loaded into the planning software.  The gross target volumes (GTV) and planning target volumes (PTV) were delinieated, and avoidance structures were contoured.  Treatment planning then occurred.  The radiation prescription was entered and confirmed.  A total of two complex treatment devices were fabricated in the form of the BodyFix immobilization pillow and a neck accuform cushion.  I have requested : 3D Simulation  I have requested a DVH of the following structures: targets and all normal structures near the target including left kidney, right kidney, liver, stomach, duodenum, spinal cord and skin as noted on the radiation plan to maintain doses in adherence with established limits  SPECIAL TREATMENT PROCEDURE:  The planned course of therapy using radiation constitutes a special treatment procedure. Special care is required in the management of this patient for the following reasons. High dose per fraction requiring special monitoring for  increased toxicities of treatment including daily imaging..  The special nature of the planned course of radiotherapy will require increased physician supervision and oversight to ensure patient's safety with optimal treatment outcomes.    This requires extended time and effort.    PLAN:  The patient will receive 45 Gy in 5 fractions.  ________________________________  Sheral Apley Tammi Klippel, M.D.

## 2022-01-03 DIAGNOSIS — N189 Chronic kidney disease, unspecified: Secondary | ICD-10-CM | POA: Diagnosis not present

## 2022-01-03 DIAGNOSIS — D638 Anemia in other chronic diseases classified elsewhere: Secondary | ICD-10-CM | POA: Diagnosis not present

## 2022-01-03 DIAGNOSIS — D6959 Other secondary thrombocytopenia: Secondary | ICD-10-CM | POA: Diagnosis not present

## 2022-01-03 DIAGNOSIS — R809 Proteinuria, unspecified: Secondary | ICD-10-CM | POA: Diagnosis not present

## 2022-01-03 DIAGNOSIS — I5043 Acute on chronic combined systolic (congestive) and diastolic (congestive) heart failure: Secondary | ICD-10-CM | POA: Diagnosis not present

## 2022-01-03 DIAGNOSIS — I129 Hypertensive chronic kidney disease with stage 1 through stage 4 chronic kidney disease, or unspecified chronic kidney disease: Secondary | ICD-10-CM | POA: Diagnosis not present

## 2022-01-03 DIAGNOSIS — E1129 Type 2 diabetes mellitus with other diabetic kidney complication: Secondary | ICD-10-CM | POA: Diagnosis not present

## 2022-01-03 DIAGNOSIS — E1122 Type 2 diabetes mellitus with diabetic chronic kidney disease: Secondary | ICD-10-CM | POA: Diagnosis not present

## 2022-01-03 DIAGNOSIS — E278 Other specified disorders of adrenal gland: Secondary | ICD-10-CM | POA: Diagnosis not present

## 2022-01-03 DIAGNOSIS — D469 Myelodysplastic syndrome, unspecified: Secondary | ICD-10-CM | POA: Diagnosis not present

## 2022-01-05 DIAGNOSIS — C7402 Malignant neoplasm of cortex of left adrenal gland: Secondary | ICD-10-CM | POA: Diagnosis not present

## 2022-01-05 DIAGNOSIS — Z510 Encounter for antineoplastic radiation therapy: Secondary | ICD-10-CM | POA: Diagnosis not present

## 2022-01-05 DIAGNOSIS — C7492 Malignant neoplasm of unspecified part of left adrenal gland: Secondary | ICD-10-CM | POA: Diagnosis not present

## 2022-01-08 ENCOUNTER — Ambulatory Visit
Admission: RE | Admit: 2022-01-08 | Discharge: 2022-01-08 | Disposition: A | Discharge status: Home / Self Care | Payer: Medicare Other | Source: Ambulatory Visit | Attending: Radiation Oncology | Admitting: Radiation Oncology

## 2022-01-08 ENCOUNTER — Other Ambulatory Visit: Payer: Self-pay

## 2022-01-08 DIAGNOSIS — C7402 Malignant neoplasm of cortex of left adrenal gland: Secondary | ICD-10-CM | POA: Diagnosis not present

## 2022-01-08 DIAGNOSIS — C7492 Malignant neoplasm of unspecified part of left adrenal gland: Secondary | ICD-10-CM

## 2022-01-08 DIAGNOSIS — Z510 Encounter for antineoplastic radiation therapy: Secondary | ICD-10-CM | POA: Diagnosis not present

## 2022-01-08 DIAGNOSIS — C61 Malignant neoplasm of prostate: Secondary | ICD-10-CM

## 2022-01-08 LAB — RAD ONC ARIA SESSION SUMMARY
Course Elapsed Days: 0
Plan Fractions Treated to Date: 1
Plan Prescribed Dose Per Fraction: 4.5 Gy
Plan Total Fractions Prescribed: 10
Plan Total Prescribed Dose: 45 Gy
Reference Point Dosage Given to Date: 4.5 Gy
Reference Point Session Dosage Given: 4.5 Gy
Session Number: 1

## 2022-01-08 NOTE — Progress Notes (Signed)
RN to [facility] to monitor pacemaker.  Patient alert and verbally responsive no complaints.  Heart rate steady at 84.  No signs or symptoms of distress observed.

## 2022-01-09 ENCOUNTER — Ambulatory Visit
Admission: RE | Admit: 2022-01-09 | Discharge: 2022-01-09 | Disposition: A | Discharge status: Home / Self Care | Payer: Medicare Other | Source: Ambulatory Visit | Attending: Radiation Oncology | Admitting: Radiation Oncology

## 2022-01-09 ENCOUNTER — Other Ambulatory Visit: Payer: Self-pay

## 2022-01-09 ENCOUNTER — Ambulatory Visit: Payer: Medicare Other | Admitting: Radiation Oncology

## 2022-01-09 DIAGNOSIS — C7492 Malignant neoplasm of unspecified part of left adrenal gland: Secondary | ICD-10-CM | POA: Diagnosis not present

## 2022-01-09 DIAGNOSIS — C7402 Malignant neoplasm of cortex of left adrenal gland: Secondary | ICD-10-CM | POA: Diagnosis not present

## 2022-01-09 DIAGNOSIS — Z510 Encounter for antineoplastic radiation therapy: Secondary | ICD-10-CM | POA: Diagnosis not present

## 2022-01-09 LAB — RAD ONC ARIA SESSION SUMMARY
Course Elapsed Days: 1
Plan Fractions Treated to Date: 2
Plan Prescribed Dose Per Fraction: 4.5 Gy
Plan Total Fractions Prescribed: 10
Plan Total Prescribed Dose: 45 Gy
Reference Point Dosage Given to Date: 9 Gy
Reference Point Session Dosage Given: 4.5 Gy
Session Number: 2

## 2022-01-10 ENCOUNTER — Ambulatory Visit: Payer: Medicare Other | Admitting: Radiation Oncology

## 2022-01-10 ENCOUNTER — Ambulatory Visit
Admission: RE | Admit: 2022-01-10 | Discharge: 2022-01-10 | Disposition: A | Discharge status: Home / Self Care | Payer: Medicare Other | Source: Ambulatory Visit | Attending: Radiation Oncology | Admitting: Radiation Oncology

## 2022-01-10 ENCOUNTER — Other Ambulatory Visit: Payer: Self-pay

## 2022-01-10 DIAGNOSIS — C7492 Malignant neoplasm of unspecified part of left adrenal gland: Secondary | ICD-10-CM

## 2022-01-10 DIAGNOSIS — C7402 Malignant neoplasm of cortex of left adrenal gland: Secondary | ICD-10-CM | POA: Diagnosis not present

## 2022-01-10 DIAGNOSIS — Z510 Encounter for antineoplastic radiation therapy: Secondary | ICD-10-CM | POA: Diagnosis not present

## 2022-01-10 LAB — RAD ONC ARIA SESSION SUMMARY
Course Elapsed Days: 2
Plan Fractions Treated to Date: 3
Plan Prescribed Dose Per Fraction: 4.5 Gy
Plan Total Fractions Prescribed: 10
Plan Total Prescribed Dose: 45 Gy
Reference Point Dosage Given to Date: 13.5 Gy
Reference Point Session Dosage Given: 4.5 Gy
Session Number: 3

## 2022-01-11 ENCOUNTER — Other Ambulatory Visit: Payer: Self-pay

## 2022-01-11 ENCOUNTER — Ambulatory Visit: Payer: Medicare Other | Admitting: Radiation Oncology

## 2022-01-11 ENCOUNTER — Ambulatory Visit
Admission: RE | Admit: 2022-01-11 | Discharge: 2022-01-11 | Disposition: A | Discharge status: Home / Self Care | Payer: Medicare Other | Source: Ambulatory Visit | Attending: Radiation Oncology | Admitting: Radiation Oncology

## 2022-01-11 DIAGNOSIS — C7492 Malignant neoplasm of unspecified part of left adrenal gland: Secondary | ICD-10-CM

## 2022-01-11 DIAGNOSIS — Z510 Encounter for antineoplastic radiation therapy: Secondary | ICD-10-CM | POA: Diagnosis not present

## 2022-01-11 DIAGNOSIS — C7402 Malignant neoplasm of cortex of left adrenal gland: Secondary | ICD-10-CM | POA: Diagnosis not present

## 2022-01-11 LAB — RAD ONC ARIA SESSION SUMMARY
Course Elapsed Days: 3
Plan Fractions Treated to Date: 4
Plan Prescribed Dose Per Fraction: 4.5 Gy
Plan Total Fractions Prescribed: 10
Plan Total Prescribed Dose: 45 Gy
Reference Point Dosage Given to Date: 18 Gy
Reference Point Session Dosage Given: 4.5 Gy
Session Number: 4

## 2022-01-12 ENCOUNTER — Other Ambulatory Visit: Payer: Self-pay

## 2022-01-12 ENCOUNTER — Ambulatory Visit: Payer: Medicare Other | Admitting: Radiation Oncology

## 2022-01-12 ENCOUNTER — Ambulatory Visit
Admission: RE | Admit: 2022-01-12 | Discharge: 2022-01-12 | Disposition: A | Discharge status: Home / Self Care | Payer: Medicare Other | Source: Ambulatory Visit | Attending: Radiation Oncology | Admitting: Radiation Oncology

## 2022-01-12 DIAGNOSIS — Z510 Encounter for antineoplastic radiation therapy: Secondary | ICD-10-CM | POA: Diagnosis not present

## 2022-01-12 DIAGNOSIS — C7402 Malignant neoplasm of cortex of left adrenal gland: Secondary | ICD-10-CM | POA: Diagnosis not present

## 2022-01-12 DIAGNOSIS — C7492 Malignant neoplasm of unspecified part of left adrenal gland: Secondary | ICD-10-CM | POA: Diagnosis not present

## 2022-01-12 LAB — RAD ONC ARIA SESSION SUMMARY
Course Elapsed Days: 4
Plan Fractions Treated to Date: 5
Plan Prescribed Dose Per Fraction: 4.5 Gy
Plan Total Fractions Prescribed: 10
Plan Total Prescribed Dose: 45 Gy
Reference Point Dosage Given to Date: 22.5 Gy
Reference Point Session Dosage Given: 4.5 Gy
Session Number: 5

## 2022-01-16 ENCOUNTER — Ambulatory Visit
Admission: RE | Admit: 2022-01-16 | Discharge: 2022-01-16 | Disposition: A | Discharge status: Home / Self Care | Payer: Medicare Other | Source: Ambulatory Visit | Attending: Radiation Oncology | Admitting: Radiation Oncology

## 2022-01-16 ENCOUNTER — Other Ambulatory Visit: Payer: Self-pay

## 2022-01-16 DIAGNOSIS — C7492 Malignant neoplasm of unspecified part of left adrenal gland: Secondary | ICD-10-CM | POA: Diagnosis not present

## 2022-01-16 DIAGNOSIS — C7402 Malignant neoplasm of cortex of left adrenal gland: Secondary | ICD-10-CM | POA: Diagnosis not present

## 2022-01-16 DIAGNOSIS — Z510 Encounter for antineoplastic radiation therapy: Secondary | ICD-10-CM | POA: Diagnosis not present

## 2022-01-16 LAB — RAD ONC ARIA SESSION SUMMARY
Course Elapsed Days: 8
Plan Fractions Treated to Date: 6
Plan Prescribed Dose Per Fraction: 4.5 Gy
Plan Total Fractions Prescribed: 10
Plan Total Prescribed Dose: 45 Gy
Reference Point Dosage Given to Date: 27 Gy
Reference Point Session Dosage Given: 4.5 Gy
Session Number: 6

## 2022-01-17 ENCOUNTER — Ambulatory Visit: Payer: Medicare Other | Admitting: Radiation Oncology

## 2022-01-17 ENCOUNTER — Ambulatory Visit
Admission: RE | Admit: 2022-01-17 | Discharge: 2022-01-17 | Disposition: A | Discharge status: Home / Self Care | Payer: Medicare Other | Source: Ambulatory Visit | Attending: Radiation Oncology | Admitting: Radiation Oncology

## 2022-01-17 ENCOUNTER — Other Ambulatory Visit: Payer: Self-pay

## 2022-01-17 DIAGNOSIS — C7492 Malignant neoplasm of unspecified part of left adrenal gland: Secondary | ICD-10-CM | POA: Diagnosis not present

## 2022-01-17 DIAGNOSIS — C7402 Malignant neoplasm of cortex of left adrenal gland: Secondary | ICD-10-CM | POA: Diagnosis not present

## 2022-01-17 DIAGNOSIS — Z510 Encounter for antineoplastic radiation therapy: Secondary | ICD-10-CM | POA: Diagnosis not present

## 2022-01-17 LAB — RAD ONC ARIA SESSION SUMMARY
Course Elapsed Days: 9
Plan Fractions Treated to Date: 7
Plan Prescribed Dose Per Fraction: 4.5 Gy
Plan Total Fractions Prescribed: 10
Plan Total Prescribed Dose: 45 Gy
Reference Point Dosage Given to Date: 31.5 Gy
Reference Point Session Dosage Given: 4.5 Gy
Session Number: 7

## 2022-01-18 ENCOUNTER — Other Ambulatory Visit: Payer: Self-pay

## 2022-01-18 ENCOUNTER — Ambulatory Visit
Admission: RE | Admit: 2022-01-18 | Discharge: 2022-01-18 | Disposition: A | Discharge status: Home / Self Care | Payer: Medicare Other | Source: Ambulatory Visit | Attending: Radiation Oncology | Admitting: Radiation Oncology

## 2022-01-18 DIAGNOSIS — Z510 Encounter for antineoplastic radiation therapy: Secondary | ICD-10-CM | POA: Diagnosis not present

## 2022-01-18 DIAGNOSIS — C7492 Malignant neoplasm of unspecified part of left adrenal gland: Secondary | ICD-10-CM

## 2022-01-18 DIAGNOSIS — C7402 Malignant neoplasm of cortex of left adrenal gland: Secondary | ICD-10-CM | POA: Diagnosis not present

## 2022-01-18 LAB — RAD ONC ARIA SESSION SUMMARY
Course Elapsed Days: 10
Plan Fractions Treated to Date: 8
Plan Prescribed Dose Per Fraction: 4.5 Gy
Plan Total Fractions Prescribed: 10
Plan Total Prescribed Dose: 45 Gy
Reference Point Dosage Given to Date: 36 Gy
Reference Point Session Dosage Given: 4.5 Gy
Session Number: 8

## 2022-01-19 ENCOUNTER — Ambulatory Visit: Payer: Medicare Other | Admitting: Radiation Oncology

## 2022-01-19 ENCOUNTER — Other Ambulatory Visit: Payer: Self-pay

## 2022-01-19 ENCOUNTER — Ambulatory Visit
Admission: RE | Admit: 2022-01-19 | Discharge: 2022-01-19 | Disposition: A | Discharge status: Home / Self Care | Payer: Medicare Other | Source: Ambulatory Visit | Attending: Radiation Oncology | Admitting: Radiation Oncology

## 2022-01-19 DIAGNOSIS — C7402 Malignant neoplasm of cortex of left adrenal gland: Secondary | ICD-10-CM | POA: Diagnosis not present

## 2022-01-19 DIAGNOSIS — C7492 Malignant neoplasm of unspecified part of left adrenal gland: Secondary | ICD-10-CM | POA: Diagnosis not present

## 2022-01-19 DIAGNOSIS — Z510 Encounter for antineoplastic radiation therapy: Secondary | ICD-10-CM | POA: Diagnosis not present

## 2022-01-19 LAB — RAD ONC ARIA SESSION SUMMARY
Course Elapsed Days: 11
Plan Fractions Treated to Date: 9
Plan Prescribed Dose Per Fraction: 4.5 Gy
Plan Total Fractions Prescribed: 10
Plan Total Prescribed Dose: 45 Gy
Reference Point Dosage Given to Date: 40.5 Gy
Reference Point Session Dosage Given: 4.5 Gy
Session Number: 9

## 2022-01-22 ENCOUNTER — Encounter: Payer: Self-pay | Admitting: Urology

## 2022-01-22 ENCOUNTER — Other Ambulatory Visit: Payer: Self-pay

## 2022-01-22 ENCOUNTER — Ambulatory Visit
Admission: RE | Admit: 2022-01-22 | Discharge: 2022-01-22 | Disposition: A | Discharge status: Home / Self Care | Payer: Medicare Other | Source: Ambulatory Visit | Attending: Radiation Oncology | Admitting: Radiation Oncology

## 2022-01-22 DIAGNOSIS — C7492 Malignant neoplasm of unspecified part of left adrenal gland: Secondary | ICD-10-CM

## 2022-01-22 DIAGNOSIS — C7402 Malignant neoplasm of cortex of left adrenal gland: Secondary | ICD-10-CM | POA: Diagnosis not present

## 2022-01-22 DIAGNOSIS — Z510 Encounter for antineoplastic radiation therapy: Secondary | ICD-10-CM | POA: Diagnosis not present

## 2022-01-22 LAB — RAD ONC ARIA SESSION SUMMARY
Course Elapsed Days: 14
Plan Fractions Treated to Date: 10
Plan Prescribed Dose Per Fraction: 4.5 Gy
Plan Total Fractions Prescribed: 10
Plan Total Prescribed Dose: 45 Gy
Reference Point Dosage Given to Date: 45 Gy
Reference Point Session Dosage Given: 4.5 Gy
Session Number: 10

## 2022-01-22 NOTE — Progress Notes (Signed)
RN to [facility] to monitor pacemaker.  Patient completed 10/10 UHRT.  Patient alert and verbally responsive no complaints.  Heart rate steady at 84.  No signs or symptoms of distress observed.

## 2022-01-25 ENCOUNTER — Ambulatory Visit (INDEPENDENT_AMBULATORY_CARE_PROVIDER_SITE_OTHER): Payer: Medicare Other

## 2022-01-25 DIAGNOSIS — I442 Atrioventricular block, complete: Secondary | ICD-10-CM

## 2022-01-25 LAB — CUP PACEART REMOTE DEVICE CHECK
Battery Remaining Longevity: 114 mo
Battery Voltage: 3.05 V
Brady Statistic AP VP Percent: 60.41 %
Brady Statistic AP VS Percent: 0.01 %
Brady Statistic AS VP Percent: 38.55 %
Brady Statistic AS VS Percent: 1.03 %
Brady Statistic RA Percent Paced: 60.16 %
Brady Statistic RV Percent Paced: 98.97 %
Date Time Interrogation Session: 20230914041906
Implantable Lead Implant Date: 20071018
Implantable Lead Implant Date: 20221212
Implantable Lead Location: 753859
Implantable Lead Location: 753860
Implantable Lead Model: 5076
Implantable Lead Model: 6947
Implantable Pulse Generator Implant Date: 20221212
Lead Channel Impedance Value: 247 Ohm
Lead Channel Impedance Value: 266 Ohm
Lead Channel Impedance Value: 323 Ohm
Lead Channel Impedance Value: 342 Ohm
Lead Channel Pacing Threshold Amplitude: 0.5 V
Lead Channel Pacing Threshold Amplitude: 0.625 V
Lead Channel Pacing Threshold Pulse Width: 0.4 ms
Lead Channel Pacing Threshold Pulse Width: 0.4 ms
Lead Channel Sensing Intrinsic Amplitude: 0.625 mV
Lead Channel Sensing Intrinsic Amplitude: 0.625 mV
Lead Channel Sensing Intrinsic Amplitude: 13.25 mV
Lead Channel Sensing Intrinsic Amplitude: 13.25 mV
Lead Channel Setting Pacing Amplitude: 2 V
Lead Channel Setting Pacing Amplitude: 2.5 V
Lead Channel Setting Pacing Pulse Width: 0.4 ms
Lead Channel Setting Sensing Sensitivity: 5.6 mV

## 2022-01-30 ENCOUNTER — Other Ambulatory Visit (HOSPITAL_COMMUNITY): Payer: Medicare Other

## 2022-01-31 ENCOUNTER — Other Ambulatory Visit: Payer: Self-pay | Admitting: Cardiology

## 2022-02-02 DIAGNOSIS — R600 Localized edema: Secondary | ICD-10-CM | POA: Diagnosis not present

## 2022-02-02 DIAGNOSIS — Z9189 Other specified personal risk factors, not elsewhere classified: Secondary | ICD-10-CM | POA: Diagnosis not present

## 2022-02-02 DIAGNOSIS — I5022 Chronic systolic (congestive) heart failure: Secondary | ICD-10-CM | POA: Diagnosis not present

## 2022-02-02 DIAGNOSIS — R419 Unspecified symptoms and signs involving cognitive functions and awareness: Secondary | ICD-10-CM | POA: Diagnosis not present

## 2022-02-02 DIAGNOSIS — E278 Other specified disorders of adrenal gland: Secondary | ICD-10-CM | POA: Diagnosis not present

## 2022-02-06 ENCOUNTER — Ambulatory Visit (HOSPITAL_COMMUNITY): Payer: Medicare Other | Admitting: Physician Assistant

## 2022-02-06 DIAGNOSIS — G3184 Mild cognitive impairment, so stated: Secondary | ICD-10-CM | POA: Diagnosis not present

## 2022-02-06 DIAGNOSIS — M1A00X0 Idiopathic chronic gout, unspecified site, without tophus (tophi): Secondary | ICD-10-CM | POA: Diagnosis not present

## 2022-02-06 DIAGNOSIS — E782 Mixed hyperlipidemia: Secondary | ICD-10-CM | POA: Diagnosis not present

## 2022-02-06 DIAGNOSIS — I2510 Atherosclerotic heart disease of native coronary artery without angina pectoris: Secondary | ICD-10-CM | POA: Diagnosis not present

## 2022-02-06 DIAGNOSIS — I130 Hypertensive heart and chronic kidney disease with heart failure and stage 1 through stage 4 chronic kidney disease, or unspecified chronic kidney disease: Secondary | ICD-10-CM | POA: Diagnosis not present

## 2022-02-06 DIAGNOSIS — Z9181 History of falling: Secondary | ICD-10-CM | POA: Diagnosis not present

## 2022-02-06 DIAGNOSIS — E278 Other specified disorders of adrenal gland: Secondary | ICD-10-CM | POA: Diagnosis not present

## 2022-02-06 DIAGNOSIS — E538 Deficiency of other specified B group vitamins: Secondary | ICD-10-CM | POA: Diagnosis not present

## 2022-02-06 DIAGNOSIS — Z7982 Long term (current) use of aspirin: Secondary | ICD-10-CM | POA: Diagnosis not present

## 2022-02-06 DIAGNOSIS — E1122 Type 2 diabetes mellitus with diabetic chronic kidney disease: Secondary | ICD-10-CM | POA: Diagnosis not present

## 2022-02-06 DIAGNOSIS — N1831 Chronic kidney disease, stage 3a: Secondary | ICD-10-CM | POA: Diagnosis not present

## 2022-02-06 DIAGNOSIS — D696 Thrombocytopenia, unspecified: Secondary | ICD-10-CM | POA: Diagnosis not present

## 2022-02-06 DIAGNOSIS — I5022 Chronic systolic (congestive) heart failure: Secondary | ICD-10-CM | POA: Diagnosis not present

## 2022-02-07 DIAGNOSIS — I2510 Atherosclerotic heart disease of native coronary artery without angina pectoris: Secondary | ICD-10-CM | POA: Diagnosis not present

## 2022-02-07 DIAGNOSIS — I130 Hypertensive heart and chronic kidney disease with heart failure and stage 1 through stage 4 chronic kidney disease, or unspecified chronic kidney disease: Secondary | ICD-10-CM | POA: Diagnosis not present

## 2022-02-07 DIAGNOSIS — I5022 Chronic systolic (congestive) heart failure: Secondary | ICD-10-CM | POA: Diagnosis not present

## 2022-02-07 DIAGNOSIS — N1831 Chronic kidney disease, stage 3a: Secondary | ICD-10-CM | POA: Diagnosis not present

## 2022-02-07 DIAGNOSIS — G3184 Mild cognitive impairment, so stated: Secondary | ICD-10-CM | POA: Diagnosis not present

## 2022-02-07 DIAGNOSIS — E1122 Type 2 diabetes mellitus with diabetic chronic kidney disease: Secondary | ICD-10-CM | POA: Diagnosis not present

## 2022-02-07 NOTE — Progress Notes (Signed)
Remote pacemaker transmission.   

## 2022-02-13 DIAGNOSIS — G3184 Mild cognitive impairment, so stated: Secondary | ICD-10-CM | POA: Diagnosis not present

## 2022-02-13 DIAGNOSIS — I5022 Chronic systolic (congestive) heart failure: Secondary | ICD-10-CM | POA: Diagnosis not present

## 2022-02-13 DIAGNOSIS — I130 Hypertensive heart and chronic kidney disease with heart failure and stage 1 through stage 4 chronic kidney disease, or unspecified chronic kidney disease: Secondary | ICD-10-CM | POA: Diagnosis not present

## 2022-02-13 DIAGNOSIS — I2510 Atherosclerotic heart disease of native coronary artery without angina pectoris: Secondary | ICD-10-CM | POA: Diagnosis not present

## 2022-02-13 DIAGNOSIS — E1122 Type 2 diabetes mellitus with diabetic chronic kidney disease: Secondary | ICD-10-CM | POA: Diagnosis not present

## 2022-02-13 DIAGNOSIS — N1831 Chronic kidney disease, stage 3a: Secondary | ICD-10-CM | POA: Diagnosis not present

## 2022-02-14 DIAGNOSIS — E1122 Type 2 diabetes mellitus with diabetic chronic kidney disease: Secondary | ICD-10-CM | POA: Diagnosis not present

## 2022-02-14 DIAGNOSIS — I5022 Chronic systolic (congestive) heart failure: Secondary | ICD-10-CM | POA: Diagnosis not present

## 2022-02-14 DIAGNOSIS — I130 Hypertensive heart and chronic kidney disease with heart failure and stage 1 through stage 4 chronic kidney disease, or unspecified chronic kidney disease: Secondary | ICD-10-CM | POA: Diagnosis not present

## 2022-02-14 DIAGNOSIS — I2510 Atherosclerotic heart disease of native coronary artery without angina pectoris: Secondary | ICD-10-CM | POA: Diagnosis not present

## 2022-02-14 DIAGNOSIS — Z111 Encounter for screening for respiratory tuberculosis: Secondary | ICD-10-CM | POA: Diagnosis not present

## 2022-02-14 DIAGNOSIS — G3184 Mild cognitive impairment, so stated: Secondary | ICD-10-CM | POA: Diagnosis not present

## 2022-02-14 DIAGNOSIS — N1831 Chronic kidney disease, stage 3a: Secondary | ICD-10-CM | POA: Diagnosis not present

## 2022-02-15 DIAGNOSIS — I2510 Atherosclerotic heart disease of native coronary artery without angina pectoris: Secondary | ICD-10-CM | POA: Diagnosis not present

## 2022-02-15 DIAGNOSIS — G3184 Mild cognitive impairment, so stated: Secondary | ICD-10-CM | POA: Diagnosis not present

## 2022-02-15 DIAGNOSIS — N1831 Chronic kidney disease, stage 3a: Secondary | ICD-10-CM | POA: Diagnosis not present

## 2022-02-15 DIAGNOSIS — I5022 Chronic systolic (congestive) heart failure: Secondary | ICD-10-CM | POA: Diagnosis not present

## 2022-02-15 DIAGNOSIS — E1122 Type 2 diabetes mellitus with diabetic chronic kidney disease: Secondary | ICD-10-CM | POA: Diagnosis not present

## 2022-02-15 DIAGNOSIS — I130 Hypertensive heart and chronic kidney disease with heart failure and stage 1 through stage 4 chronic kidney disease, or unspecified chronic kidney disease: Secondary | ICD-10-CM | POA: Diagnosis not present

## 2022-02-21 DIAGNOSIS — E1122 Type 2 diabetes mellitus with diabetic chronic kidney disease: Secondary | ICD-10-CM | POA: Diagnosis not present

## 2022-02-21 DIAGNOSIS — I2510 Atherosclerotic heart disease of native coronary artery without angina pectoris: Secondary | ICD-10-CM | POA: Diagnosis not present

## 2022-02-21 DIAGNOSIS — N1831 Chronic kidney disease, stage 3a: Secondary | ICD-10-CM | POA: Diagnosis not present

## 2022-02-21 DIAGNOSIS — G3184 Mild cognitive impairment, so stated: Secondary | ICD-10-CM | POA: Diagnosis not present

## 2022-02-21 DIAGNOSIS — I5022 Chronic systolic (congestive) heart failure: Secondary | ICD-10-CM | POA: Diagnosis not present

## 2022-02-21 DIAGNOSIS — I130 Hypertensive heart and chronic kidney disease with heart failure and stage 1 through stage 4 chronic kidney disease, or unspecified chronic kidney disease: Secondary | ICD-10-CM | POA: Diagnosis not present

## 2022-02-22 DIAGNOSIS — I5022 Chronic systolic (congestive) heart failure: Secondary | ICD-10-CM | POA: Diagnosis not present

## 2022-02-22 DIAGNOSIS — E1122 Type 2 diabetes mellitus with diabetic chronic kidney disease: Secondary | ICD-10-CM | POA: Diagnosis not present

## 2022-02-22 DIAGNOSIS — I2510 Atherosclerotic heart disease of native coronary artery without angina pectoris: Secondary | ICD-10-CM | POA: Diagnosis not present

## 2022-02-22 DIAGNOSIS — I130 Hypertensive heart and chronic kidney disease with heart failure and stage 1 through stage 4 chronic kidney disease, or unspecified chronic kidney disease: Secondary | ICD-10-CM | POA: Diagnosis not present

## 2022-02-22 DIAGNOSIS — N1831 Chronic kidney disease, stage 3a: Secondary | ICD-10-CM | POA: Diagnosis not present

## 2022-02-22 DIAGNOSIS — G3184 Mild cognitive impairment, so stated: Secondary | ICD-10-CM | POA: Diagnosis not present

## 2022-02-24 DIAGNOSIS — N390 Urinary tract infection, site not specified: Secondary | ICD-10-CM | POA: Diagnosis not present

## 2022-02-28 DIAGNOSIS — E1122 Type 2 diabetes mellitus with diabetic chronic kidney disease: Secondary | ICD-10-CM | POA: Diagnosis not present

## 2022-02-28 DIAGNOSIS — I130 Hypertensive heart and chronic kidney disease with heart failure and stage 1 through stage 4 chronic kidney disease, or unspecified chronic kidney disease: Secondary | ICD-10-CM | POA: Diagnosis not present

## 2022-02-28 DIAGNOSIS — G3184 Mild cognitive impairment, so stated: Secondary | ICD-10-CM | POA: Diagnosis not present

## 2022-02-28 DIAGNOSIS — I2510 Atherosclerotic heart disease of native coronary artery without angina pectoris: Secondary | ICD-10-CM | POA: Diagnosis not present

## 2022-02-28 DIAGNOSIS — I5022 Chronic systolic (congestive) heart failure: Secondary | ICD-10-CM | POA: Diagnosis not present

## 2022-02-28 DIAGNOSIS — Z23 Encounter for immunization: Secondary | ICD-10-CM | POA: Diagnosis not present

## 2022-02-28 DIAGNOSIS — N1831 Chronic kidney disease, stage 3a: Secondary | ICD-10-CM | POA: Diagnosis not present

## 2022-03-06 DIAGNOSIS — I130 Hypertensive heart and chronic kidney disease with heart failure and stage 1 through stage 4 chronic kidney disease, or unspecified chronic kidney disease: Secondary | ICD-10-CM | POA: Diagnosis not present

## 2022-03-06 DIAGNOSIS — G3184 Mild cognitive impairment, so stated: Secondary | ICD-10-CM | POA: Diagnosis not present

## 2022-03-06 DIAGNOSIS — I2510 Atherosclerotic heart disease of native coronary artery without angina pectoris: Secondary | ICD-10-CM | POA: Diagnosis not present

## 2022-03-06 DIAGNOSIS — I5022 Chronic systolic (congestive) heart failure: Secondary | ICD-10-CM | POA: Diagnosis not present

## 2022-03-06 DIAGNOSIS — E1122 Type 2 diabetes mellitus with diabetic chronic kidney disease: Secondary | ICD-10-CM | POA: Diagnosis not present

## 2022-03-06 DIAGNOSIS — N1831 Chronic kidney disease, stage 3a: Secondary | ICD-10-CM | POA: Diagnosis not present

## 2022-03-07 ENCOUNTER — Inpatient Hospital Stay: Payer: Medicare Other | Attending: Hematology

## 2022-03-07 DIAGNOSIS — E278 Other specified disorders of adrenal gland: Secondary | ICD-10-CM | POA: Diagnosis not present

## 2022-03-07 LAB — CBC WITH DIFFERENTIAL/PLATELET
Abs Immature Granulocytes: 0.02 10*3/uL (ref 0.00–0.07)
Basophils Absolute: 0 10*3/uL (ref 0.0–0.1)
Basophils Relative: 0 %
Eosinophils Absolute: 0 10*3/uL (ref 0.0–0.5)
Eosinophils Relative: 0 %
HCT: 36 % — ABNORMAL LOW (ref 39.0–52.0)
Hemoglobin: 11.8 g/dL — ABNORMAL LOW (ref 13.0–17.0)
Immature Granulocytes: 1 %
Lymphocytes Relative: 21 %
Lymphs Abs: 0.7 10*3/uL (ref 0.7–4.0)
MCH: 31.8 pg (ref 26.0–34.0)
MCHC: 32.8 g/dL (ref 30.0–36.0)
MCV: 97 fL (ref 80.0–100.0)
Monocytes Absolute: 1.5 10*3/uL — ABNORMAL HIGH (ref 0.1–1.0)
Monocytes Relative: 42 %
Neutro Abs: 1.3 10*3/uL — ABNORMAL LOW (ref 1.7–7.7)
Neutrophils Relative %: 36 %
Platelets: 111 10*3/uL — ABNORMAL LOW (ref 150–400)
RBC: 3.71 MIL/uL — ABNORMAL LOW (ref 4.22–5.81)
RDW: 15.8 % — ABNORMAL HIGH (ref 11.5–15.5)
WBC: 3.6 10*3/uL — ABNORMAL LOW (ref 4.0–10.5)
nRBC: 0 % (ref 0.0–0.2)

## 2022-03-07 LAB — COMPREHENSIVE METABOLIC PANEL
ALT: 10 U/L (ref 0–44)
AST: 12 U/L — ABNORMAL LOW (ref 15–41)
Albumin: 2.9 g/dL — ABNORMAL LOW (ref 3.5–5.0)
Alkaline Phosphatase: 37 U/L — ABNORMAL LOW (ref 38–126)
Anion gap: 7 (ref 5–15)
BUN: 32 mg/dL — ABNORMAL HIGH (ref 8–23)
CO2: 36 mmol/L — ABNORMAL HIGH (ref 22–32)
Calcium: 8.8 mg/dL — ABNORMAL LOW (ref 8.9–10.3)
Chloride: 94 mmol/L — ABNORMAL LOW (ref 98–111)
Creatinine, Ser: 1.48 mg/dL — ABNORMAL HIGH (ref 0.61–1.24)
GFR, Estimated: 44 mL/min — ABNORMAL LOW (ref >60)
Glucose, Bld: 285 mg/dL — ABNORMAL HIGH (ref 70–99)
Potassium: 4.3 mmol/L (ref 3.5–5.1)
Sodium: 137 mmol/L (ref 135–145)
Total Bilirubin: 0.9 mg/dL (ref 0.3–1.2)
Total Protein: 5.6 g/dL — ABNORMAL LOW (ref 6.5–8.1)

## 2022-03-07 LAB — LACTATE DEHYDROGENASE: LDH: 205 U/L — ABNORMAL HIGH (ref 98–192)

## 2022-03-07 NOTE — Progress Notes (Signed)
  Radiation Oncology         [phone] ________________________________  Name: [name] MRN: [id number]  Date: 01/22/2022  DOB: August 01, 1926  End of Treatment Note  Diagnosis:   88 year old male with an 8 cm left adrenal carcinoma.      Indication for treatment:  Curative, Definitive SBRT       Radiation treatment dates:   01/08/22 - 01/22/22  Site/dose:   The left adrenal mass was treated to 45 Gy in 10 fractions of 4.5 Gy  Beams/energy:   The patient was treated using stereotactic body radiotherapy according to a 3D conformal radiotherapy plan.  Volumetric arc fields were employed to deliver 6 MV X-rays.  Image guidance was performed with per fraction cone beam CT prior to treatment under personal MD supervision.  Immobilization was achieved using BodyFix Pillow.  Narrative: The patient tolerated radiation treatment relatively well with only modest fatigue.  Plan: The patient has completed radiation treatment. The patient will return to radiation oncology clinic for routine followup in one month. I advised them to call or return sooner if they have any questions or concerns related to their recovery or treatment. ________________________________  Sheral Apley. Tammi Klippel, M.D.

## 2022-03-08 DIAGNOSIS — E538 Deficiency of other specified B group vitamins: Secondary | ICD-10-CM | POA: Diagnosis not present

## 2022-03-08 DIAGNOSIS — Z9181 History of falling: Secondary | ICD-10-CM | POA: Diagnosis not present

## 2022-03-08 DIAGNOSIS — E1122 Type 2 diabetes mellitus with diabetic chronic kidney disease: Secondary | ICD-10-CM | POA: Diagnosis not present

## 2022-03-08 DIAGNOSIS — I2510 Atherosclerotic heart disease of native coronary artery without angina pectoris: Secondary | ICD-10-CM | POA: Diagnosis not present

## 2022-03-08 DIAGNOSIS — E782 Mixed hyperlipidemia: Secondary | ICD-10-CM | POA: Diagnosis not present

## 2022-03-08 DIAGNOSIS — Z7982 Long term (current) use of aspirin: Secondary | ICD-10-CM | POA: Diagnosis not present

## 2022-03-08 DIAGNOSIS — G3184 Mild cognitive impairment, so stated: Secondary | ICD-10-CM | POA: Diagnosis not present

## 2022-03-08 DIAGNOSIS — D696 Thrombocytopenia, unspecified: Secondary | ICD-10-CM | POA: Diagnosis not present

## 2022-03-08 DIAGNOSIS — I5022 Chronic systolic (congestive) heart failure: Secondary | ICD-10-CM | POA: Diagnosis not present

## 2022-03-08 DIAGNOSIS — N1831 Chronic kidney disease, stage 3a: Secondary | ICD-10-CM | POA: Diagnosis not present

## 2022-03-08 DIAGNOSIS — I130 Hypertensive heart and chronic kidney disease with heart failure and stage 1 through stage 4 chronic kidney disease, or unspecified chronic kidney disease: Secondary | ICD-10-CM | POA: Diagnosis not present

## 2022-03-08 DIAGNOSIS — E278 Other specified disorders of adrenal gland: Secondary | ICD-10-CM | POA: Diagnosis not present

## 2022-03-08 DIAGNOSIS — M1A00X0 Idiopathic chronic gout, unspecified site, without tophus (tophi): Secondary | ICD-10-CM | POA: Diagnosis not present

## 2022-03-09 DIAGNOSIS — N1831 Chronic kidney disease, stage 3a: Secondary | ICD-10-CM | POA: Diagnosis not present

## 2022-03-09 DIAGNOSIS — I2510 Atherosclerotic heart disease of native coronary artery without angina pectoris: Secondary | ICD-10-CM | POA: Diagnosis not present

## 2022-03-09 DIAGNOSIS — G3184 Mild cognitive impairment, so stated: Secondary | ICD-10-CM | POA: Diagnosis not present

## 2022-03-09 DIAGNOSIS — E1122 Type 2 diabetes mellitus with diabetic chronic kidney disease: Secondary | ICD-10-CM | POA: Diagnosis not present

## 2022-03-09 DIAGNOSIS — I5022 Chronic systolic (congestive) heart failure: Secondary | ICD-10-CM | POA: Diagnosis not present

## 2022-03-09 DIAGNOSIS — I130 Hypertensive heart and chronic kidney disease with heart failure and stage 1 through stage 4 chronic kidney disease, or unspecified chronic kidney disease: Secondary | ICD-10-CM | POA: Diagnosis not present

## 2022-03-12 DIAGNOSIS — N1831 Chronic kidney disease, stage 3a: Secondary | ICD-10-CM | POA: Diagnosis not present

## 2022-03-12 DIAGNOSIS — I5022 Chronic systolic (congestive) heart failure: Secondary | ICD-10-CM | POA: Diagnosis not present

## 2022-03-12 DIAGNOSIS — I130 Hypertensive heart and chronic kidney disease with heart failure and stage 1 through stage 4 chronic kidney disease, or unspecified chronic kidney disease: Secondary | ICD-10-CM | POA: Diagnosis not present

## 2022-03-12 DIAGNOSIS — E1122 Type 2 diabetes mellitus with diabetic chronic kidney disease: Secondary | ICD-10-CM | POA: Diagnosis not present

## 2022-03-12 DIAGNOSIS — G3184 Mild cognitive impairment, so stated: Secondary | ICD-10-CM | POA: Diagnosis not present

## 2022-03-12 DIAGNOSIS — I2510 Atherosclerotic heart disease of native coronary artery without angina pectoris: Secondary | ICD-10-CM | POA: Diagnosis not present

## 2022-03-14 ENCOUNTER — Encounter: Payer: Self-pay | Admitting: Student

## 2022-03-14 ENCOUNTER — Encounter: Payer: Self-pay | Admitting: Hematology

## 2022-03-14 ENCOUNTER — Ambulatory Visit: Payer: Medicare Other | Attending: Student | Admitting: Student

## 2022-03-14 ENCOUNTER — Inpatient Hospital Stay: Payer: Medicare Other | Attending: Hematology | Admitting: Hematology

## 2022-03-14 ENCOUNTER — Ambulatory Visit
Admission: RE | Admit: 2022-03-14 | Discharge: 2022-03-14 | Disposition: A | Discharge status: Home / Self Care | Payer: Medicare Other | Source: Ambulatory Visit | Attending: Urology | Admitting: Urology

## 2022-03-14 VITALS — BP 98/60 | HR 68 | Ht 66.0 in | Wt 146.2 lb

## 2022-03-14 VITALS — HR 66 | Temp 97.8°F | Resp 18 | Wt 146.2 lb

## 2022-03-14 DIAGNOSIS — D649 Anemia, unspecified: Secondary | ICD-10-CM | POA: Diagnosis not present

## 2022-03-14 DIAGNOSIS — E611 Iron deficiency: Secondary | ICD-10-CM

## 2022-03-14 DIAGNOSIS — C7412 Malignant neoplasm of medulla of left adrenal gland: Secondary | ICD-10-CM

## 2022-03-14 DIAGNOSIS — E278 Other specified disorders of adrenal gland: Secondary | ICD-10-CM | POA: Diagnosis not present

## 2022-03-14 DIAGNOSIS — I10 Essential (primary) hypertension: Secondary | ICD-10-CM | POA: Insufficient documentation

## 2022-03-14 DIAGNOSIS — D4620 Refractory anemia with excess of blasts, unspecified: Secondary | ICD-10-CM | POA: Diagnosis not present

## 2022-03-14 DIAGNOSIS — C7402 Malignant neoplasm of cortex of left adrenal gland: Secondary | ICD-10-CM

## 2022-03-14 DIAGNOSIS — I25119 Atherosclerotic heart disease of native coronary artery with unspecified angina pectoris: Secondary | ICD-10-CM

## 2022-03-14 DIAGNOSIS — I5022 Chronic systolic (congestive) heart failure: Secondary | ICD-10-CM | POA: Insufficient documentation

## 2022-03-14 DIAGNOSIS — E538 Deficiency of other specified B group vitamins: Secondary | ICD-10-CM | POA: Diagnosis not present

## 2022-03-14 DIAGNOSIS — I442 Atrioventricular block, complete: Secondary | ICD-10-CM | POA: Insufficient documentation

## 2022-03-14 DIAGNOSIS — I2510 Atherosclerotic heart disease of native coronary artery without angina pectoris: Secondary | ICD-10-CM | POA: Insufficient documentation

## 2022-03-14 DIAGNOSIS — E279 Disorder of adrenal gland, unspecified: Secondary | ICD-10-CM | POA: Diagnosis not present

## 2022-03-14 DIAGNOSIS — E785 Hyperlipidemia, unspecified: Secondary | ICD-10-CM | POA: Diagnosis not present

## 2022-03-14 MED ORDER — ASPIRIN 81 MG PO TBEC: 81.0000 mg | DELAYED_RELEASE_TABLET | Freq: Every day | ORAL | 3 refills | Status: DC

## 2022-03-14 NOTE — Progress Notes (Signed)
  Radiation Oncology         [phone] ________________________________  Name: [name] MRN: [id number]  Date of Service: 03/14/2022  DOB: 05/27/1926  Post Treatment Telephone Note  Diagnosis:  88 year old male with an 8 cm left adrenal carcinoma (as documented in provider EOT note).   The patient was NOT available for call today. Patient's number is disconnected. A detailed voicemail was left at [phone] (son).  The patient will follow up with his medical oncologist Dr. Delton Coombes at [facility] for ongoing surveillance, and was encouraged to call if he develops concerns or questions regarding radiation.  This concludes the nursing interview.  Leandra Kern, LPN

## 2022-03-14 NOTE — Patient Instructions (Signed)
[facility]  Discharge Instructions  You were seen and examined today by Dr. Delton Coombes.  Dr. Delton Coombes discussed your most recent lab work which revealed that everything is stable.  Follow-up as scheduled in 3 months with labs and scan prior.    Thank you for choosing [facility] to provide your oncology and hematology care.   To afford each patient quality time with our provider, please arrive at least 15 minutes before your scheduled appointment time. You may need to reschedule your appointment if you arrive late (10 or more minutes). Arriving late affects you and other patients whose appointments are after yours.  Also, if you miss three or more appointments without notifying the office, you may be dismissed from the clinic at the provider's discretion.    Again, thank you for choosing [facility].  Our hope is that these requests will decrease the amount of time that you wait before being seen by our physicians.   If you have a lab appointment with the [facility] please come in thru the Main Entrance and check in at the main information desk.           _____________________________________________________________  Should you have questions after your visit to [facility], please contact our office at [phone] and follow the prompts.  Our office hours are 8:00 a.m. to 4:30 p.m. Monday - Thursday and 8:00 a.m. to 2:30 p.m. Friday.  Please note that voicemails left after 4:00 p.m. may not be returned until the following business day.  We are closed weekends and all major holidays.  You do have access to a nurse 24-7, just call the main number to the clinic [phone] and do not press any options, hold on the line and a nurse will answer the phone.    For prescription refill requests, have your pharmacy contact our office and allow 72 hours.    Masks are optional in the cancer centers. If you would  like for your care team to wear a mask while they are taking care of you, please let them know. You may have one support person who is at least 86 years old accompany you for your appointments.

## 2022-03-14 NOTE — Patient Instructions (Signed)
Medication Instructions:  Your physician recommends that you continue on your current medications as directed. Please refer to the Current Medication list given to you today.  *If you need a refill on your cardiac medications before your next appointment, please call your pharmacy*   Lab Work: NONE   If you have labs (blood work) drawn today and your tests are completely normal, you will receive your results only by: [place] (if you have MyChart) OR A paper copy in the mail If you have any lab test that is abnormal or we need to change your treatment, we will call you to review the results.   Testing/Procedures: NONE    Follow-Up: At [facility], you and your health needs are our priority.  As part of our continuing mission to provide you with exceptional heart care, we have created designated Provider Care Teams.  These Care Teams include your primary Cardiologist (physician) and Advanced Practice Providers (APPs -  Physician Assistants and Nurse Practitioners) who all work together to provide you with the care you need, when you need it.  We recommend signing up for the patient portal called "MyChart".  Sign up information is provided on this After Visit Summary.  MyChart is used to connect with patients for Virtual Visits (Telemedicine).  Patients are able to view lab/test results, encounter notes, upcoming appointments, etc.  Non-urgent messages can be sent to your provider as well.   To learn more about what you can do with MyChart, go to [email or web address].    Your next appointment:   6 month(s)  The format for your next appointment:   In Person  Provider:   Rozann Lesches, MD or Bernerd Pho, PA-C    Other Instructions Thank you for choosing [facility]!    Important Information About Sugar

## 2022-03-14 NOTE — Progress Notes (Signed)
Cardiology Office Note    Date:  03/14/2022   ID:  [name], DOB [birth year], MRN [id number]  PCP:  [name], MD  Cardiologist: Rozann Lesches, MD    Chief Complaint  Patient presents with   Follow-up    5 month visit    History of Present Illness:    [name] is a 88 y.o. male with past medical history of CAD (s/p CABG in 2006 with LIMA-LAD, SVG-LCx and SVG-dRCA, repeat cath in 12/2020 showing 3/3 patent grafts and likely culprit was 80% LPAV and medical management recommended), HFrEF/ICM (EF at 45-50% in 2016, at 40-45% by repeat echo in 12/2020), Medtronic ICD (previously followed by Dr. Lovena Le and at [facility] in 2016 and not replaced due to improvement in his EF but developed CHB in 04/2021 and underwent gen change and device upgrade to DDD device), history of prostate cancer, nephrolithiasis, MDS, HTN and HLD who presents to the office today for 42-monthfollow-up.  He was examined by myself in 10/2021 and did report having worsening lower extremity edema in the setting of not taking Lasix due to frequent urination. He had recently resumed this with improvement in his symptoms and was encouraged to continue on Lasix 40 mg daily while remaining on Coreg 12.5 mg twice daily, Entresto 24-26 mg twice daily and Spironolactone 12.5 mg daily for his cardiomyopathy. He was not on an SGLT2 inhibitor given his variable PO intake.  He has been followed by Oncology in the interim for a left adrenal mass which was suspicious for adrenal carcinoma and was not felt to be a surgical candidate given his advanced age. He did follow-up with Radiation Oncology and underwent radiation therapy.  In talking with the patient and his son today, [place] to the hospital with an ulcer and they felt more comfortable being at an ALF as compared to returning home. They have rooms beside one another. He reports things have been going  well since there. He has been more compliant with Lasix and reports his lower extremity edema has significantly improved. He denies any recent chest pain or palpitations. No specific dyspnea on exertion, orthopnea or PND. He was undergoing radiation as outlined above and he completed this in 01/2022.   Past Medical History:  Diagnosis Date   BPH (benign prostatic hypertrophy)    CHF ([place]estive heart failure) ([place]    a. EF at 45-50% in 2016 b. EF at 40-45% by repeat echo in 070/2637  [place]    [facility] heart block ([facility]    Medtronic pacemaker 04/2021 - Dr. TLovena Le  Coronary artery disease    a. Multivessel status post CABG 8/06, LIMA-LAD, SVG-CFX, SVG-distal RCA b. 12/2020: cath showing 3/3 patent grafts with 80% stenosis along LPAV prior to bifurication and medical management recommended.   Essential hypertension    Hyperlipidemia    Ischemic cardiomyopathy    [place] low grade ([place] 11/27/2015   Nephrolithiasis    Prostate cancer ([place]    Vitamin B 12 deficiency 07/27/2016    Past Surgical History:  Procedure Laterality Date   CARDIAC DEFIBRILLATOR PLACEMENT     COLONOSCOPY     COLONOSCOPY N/A 08/19/2013   Procedure: COLONOSCOPY;  Surgeon: NRogene Houston MD;  Location: [facility];  Service: Endoscopy;  Laterality: N/A;  930   CORONARY ARTERY BYPASS GRAFT     8/06 with left anterior  descending artery, saphenous vein graft to circumflex, saphenous vein graft to distal right coronary artery   CYSTOSCOPY/URETEROSCOPY/HOLMIUM LASER/STENT PLACEMENT Right 11/07/2020   Procedure: CYSTOSCOPY/URETEROSCOPY/HOLMIUM LASER/STENT PLACEMENT;  Surgeon: Alexis Frock, MD;  Location: [facility] ORS;  Service: Urology;  Laterality: Right;   HERNIA REPAIR     LEFT HEART CATH AND CORS/GRAFTS ANGIOGRAPHY N/A 12/19/2020   Procedure: LEFT HEART CATH AND CORS/GRAFTS ANGIOGRAPHY;  Surgeon: Leonie Man, MD;  Location: [facility] CV LAB;  Service: Cardiovascular;   Laterality: N/A;   PACEMAKER IMPLANT N/A 04/24/2021   Procedure: PACEMAKER IMPLANT;  Surgeon: Evans Lance, MD;  Location: [facility] CV LAB;  Service: Cardiovascular;  Laterality: N/A;   POLYPECTOMY     PROSTATE SURGERY     TEMPORARY PACEMAKER N/A 04/21/2021   Procedure: TEMPORARY PACEMAKER;  Surgeon: Martinique, Peter M, MD;  Location: [facility] CV LAB;  Service: Cardiovascular;  Laterality: N/A;    Current Medications: Outpatient Medications Prior to Visit  Medication Sig Dispense Refill   carvedilol (COREG) 12.5 MG tablet Take 1 tablet (12.5 mg total) by mouth 2 (two) times daily with a meal. 180 tablet 3   Cholecalciferol (VITAMIN D3) 1000 UNITS CAPS Take 1 capsule by mouth daily.     diphenhydrAMINE (BENADRYL) 25 MG tablet Take 25 mg by mouth at bedtime as needed for sleep.     DODEX 1000 MCG/ML injection Inject 1,000 mcg into the skin every 30 (thirty) days.     ENTRESTO 24-26 MG TAKE 1 TABLET BY MOUTH TWICE DAILY 60 tablet 6   furosemide (LASIX) 40 MG tablet TAKE 1 TABLET(40 MG) BY MOUTH DAILY 90 tablet 3   nitroGLYCERIN (NITROSTAT) 0.4 MG SL tablet Place 1 tablet (0.4 mg total) under the tongue every 5 (five) minutes as needed. 25 tablet 3   potassium chloride (KLOR-CON) 10 MEQ tablet Take 1 tablet (10 mEq total) by mouth daily. 30 tablet 0   simvastatin (ZOCOR) 40 MG tablet TAKE 1 TABLET BY MOUTH EVERY DAY IN THE EVENING 90 tablet 2   Vitamin D3 (VITAMIN D) 25 MCG tablet Take 25 mcg by mouth daily.     zinc gluconate 50 MG tablet Take 50 mg by mouth daily.     acetaminophen (TYLENOL) 500 MG tablet Take 500 mg by mouth every 6 (six) hours as needed.     Ascorbic Acid (VITAMIN C) 500 MG CAPS Take 500 mg by mouth daily.     aspirin EC 81 MG tablet Take 81 mg by mouth daily. Swallow whole.     Melatonin 5 MG CAPS Take 5 mg by mouth as needed.     spironolactone (ALDACTONE) 25 MG tablet Take 0.5 tablets (12.5 mg total) by mouth daily. (Patient not taking: Reported on 03/14/2022) 45  tablet 3   No facility-administered medications prior to visit.     Allergies:   Codeine   Social History   Socioeconomic History   Marital status: Married    Spouse name: Not on file   Number of children: Not on file   Years of education: Not on file   Highest education level: Not on file  Occupational History   Occupation: Retired    Fish farm manager: RETIRED  Tobacco Use   Smoking status: Former    Packs/day: 0.25    Years: 44.00    Total pack years: 11.00    Types: Cigarettes    Quit date: 05/14/1992    Years since quitting: 29.8   Smokeless tobacco: Never  Vaping Use  Vaping Use: Never used  Substance and Sexual Activity   Alcohol use: No   Drug use: No   Sexual activity: Not on file    Comment: married 63 years  Other Topics Concern   Not on file  Social History Narrative   Not on file   Social Determinants of Health   Financial Resource Strain: Not on file  Food Insecurity: Not on file  Transportation Needs: Not on file  Physical Activity: Not on file  Stress: Not on file  Social Connections: Not on file     Family History:  The patient's family history includes Colon cancer in his father; Coronary artery disease in an other family member.   Review of Systems:    Please see the history of present illness.     All other systems reviewed and are otherwise negative except as noted above.   Physical Exam:    VS:  BP 98/60   Pulse 68   Ht '5\' 6"'$  (1.676 m)   Wt 146 lb 3.2 oz (66.3 kg)   SpO2 98%   BMI 23.60 kg/m    General: Pleasant, elderly male appearing in no acute distress. Sitting in wheelchair.  Head: Normocephalic, atraumatic. Neck: No carotid bruits. JVD not elevated.  Lungs: Respirations regular and unlabored, without wheezes or rales.  Heart: Regular rate and rhythm. No S3 or S4.  No murmur, no rubs, or gallops appreciated. Abdomen: Appears non-distended. No obvious abdominal masses. Msk:  Strength and tone appear normal for age. No obvious  joint deformities or effusions. Extremities: No clubbing or cyanosis. No pitting edema.  Distal pedal pulses are 2+ bilaterally. Neuro: Alert and oriented X 3. Moves all extremities spontaneously. No focal deficits noted. Psych:  Responds to questions appropriately with a normal affect. Skin: No rashes or lesions noted  Wt Readings from Last 3 Encounters:  03/14/22 146 lb 3.2 oz (66.3 kg)  03/14/22 146 lb 3.2 oz (66.3 kg)  12/14/21 153 lb 4 oz (69.5 kg)     Studies/Labs Reviewed:   EKG:  EKG is not ordered today.  Recent Labs: 04/22/2021: TSH 3.006 04/25/2021: Magnesium 1.8 06/22/2021: B Natriuretic Peptide 2,087.0 03/07/2022: ALT 10; BUN 32; Creatinine, Ser 1.48; Hemoglobin 11.8; Platelets 111; Potassium 4.3; Sodium 137   Lipid Panel    Component Value Date/Time   CHOL 125 12/17/2020 0137   TRIG 71 12/17/2020 0137   HDL 29 (L) 12/17/2020 0137   CHOLHDL 4.3 12/17/2020 0137   VLDL 14 12/17/2020 0137   LDLCALC 82 12/17/2020 0137   LDLCALC 78 06/09/2019 0808    Additional studies/ records that were reviewed today include:   Echocardiogram: 12/2020 IMPRESSIONS     1. Poor acoustic windows.   2. Poor acoustic windows limit study Endocardium is not seen in all  segments even with DEFINITY There appears to be hypokinesis of the basal  inferior, basal inferoseptal, the inferolaterall and apical walls OVerall  LVEF appears mildly depressed LVEF 40  to 45%, again EF difficult given poor image quality. . The left  ventricular internal cavity size was severely dilated. There is mild left  ventricular hypertrophy. Left ventricular diastolic parameters are  indeterminate.   3. Device lead seen in RV to distal apex. Right ventricular systolic  function is normal. The right ventricular size is normal. There is  moderately elevated pulmonary artery systolic pressure.   4. Left atrial size was severely dilated.   5. The mitral valve is normal in structure. Mild mitral valve  regurgitation.   6. The aortic valve is tricuspid. Aortic valve regurgitation is not  visualized. Mild to moderate aortic valve sclerosis/calcification is  present, without any evidence of aortic stenosis.   7. The inferior vena cava is dilated in size with <50% respiratory  variability, suggesting right atrial pressure of 15 mmHg.   LHC: 12/2020 Prox RCA to Dist RCA lesion is 100% stenosed with 100% stenosed side branch in RV Branch.   Mid LM to Prox LAD lesion is 60% stenosed.   Prox Cx to Mid Cx lesion is 100% stenosed.   Prox LAD to Mid LAD lesion is 80% stenosed with 95% stenosed side branch in 2nd Diag.   Mid LAD lesion is 100% stenosed with 75% stenosed side branch in 3rd Diag.   ** LPAV lesion is 80% stenosed. -Fills via retrograde flow from SVG-OM 2.  Not approachable percutaneously.   LIMA-LAD and is normal in caliber.  The graft exhibits no disease.   SVG-2nd Mrg graft was visualized by angiography and is large.  The graft exhibits no disease.   SVG-dRCA and is normal in caliber.  The graft exhibits no disease.   ----------------------   LV end diastolic pressure is moderately elevated.   There is no aortic valve stenosis.   SUMMARY Severe native CAD: 50-60% LEFT MAIN,  100% flush ostial RCA occlusion, 100 % proximal LCx occlusion after small OM1/RI,  Diffuse 80% proximal-mid followed by 100% mid LAD occlusion 3 of 3 patent grafts: LIMA-LAD, SVG-OM 2, SVG-RCA. Likely culprit lesion is 80% stenosis in the distal AVG LCx prior to bifurcation into LPL 1 and LPL 2 -> this fills via retrograde flow from the SVG-OM 2.  Not approachable from percutaneous option. Moderately elevated LAP.   RECOMMENDATIONS Optimize medical management.  Assessment:    1. Coronary artery disease involving native coronary artery of native heart without angina pectoris   2. Heart failure with mildly reduced ejection fraction (HFmrEF) (HCC)   3. Heart block AV complete ([place])   4. Essential  hypertension   5. Hyperlipidemia LDL goal <70      Plan:   In order of problems listed above:  1. CAD - He is s/p CABG in 2006 with LIMA-LAD, SVG-LCx and SVG-dRCA and catheterization in 12/2020 showed 3/3 patent grafts and the likely culprit was an 80% LPAV stenosis and medical management was recommended. - While his activity is more limited, he denies any recent anginal symptoms. - Continue Coreg 12.5 mg twice daily and Simvastatin 40 mg daily. He is not currently listed as being on ASA 81 mg daily and we will verify with his ALF that he is receiving this. By review of Oncology notes from 11/2021, there is no mention of stopping this as his hemoglobin and platelet counts remained stable. CBC on 03/07/2022 showed his hemoglobin was at 11.8 with platelets at 111 K.   2. HFmrEF - His EF was at 40 to 45% by most recent echocardiogram in 12/2020 which was overall similar to prior imaging from 2016.  His volume status has significantly improved since his last office visit and I suspect this is secondary to compliance with Lasix now that he is at ALF as he previously used to skip doses. Would not repeat an echo at this time as it would not change his management strategy.  - Will continue current medical therapy for now with Coreg 12.5 mg twice daily, Entresto 24-26 mg twice daily and Lasix 40 mg daily. Creatinine was stable at 1.48  on 03/07/2022 which is close to his baseline. He is no longer on Spironolactone and suspect this is secondary to hypotension. Will stay off of this for now given his BP at 98/60 during today's visit. We have not started him on an SGLT2 inhibitor given his variable PO intake and advanced age.  3. CHB - He developed CHB in 04/2021 and underwent generator change with device upgrade to DDD device at that time. Most recent interrogation in 01/2022 showed his device was functioning normally with only one short episode of NSVT.  4. HTN - His BP is soft at 98/60 during today's  visit with similar value by recheck. He denies any associated lightheadedness, dizziness or presyncope. Continue Coreg 12.5 mg twice daily, Entresto 24-26 mg twice daily and Lasix 40 mg daily. He is no longer on Spironolactone as discussed above.  5. HLD - Followed by his PCP. His LDL was at 50 when checked in 12/2021. Continue current medical therapy with Simvastatin 40 mg daily.    Medication Adjustments/Labs and Tests Ordered: Current medicines are reviewed at length with the patient today.  Concerns regarding medicines are outlined above.  Medication changes, Labs and Tests ordered today are listed in the Patient Instructions below. Patient Instructions  Medication Instructions:  Your physician recommends that you continue on your current medications as directed. Please refer to the Current Medication list given to you today.  *If you need a refill on your cardiac medications before your next appointment, please call your pharmacy*   Lab Work: NONE   If you have labs (blood work) drawn today and your tests are completely normal, you will receive your results only by: [place] (if you have MyChart) OR A paper copy in the mail If you have any lab test that is abnormal or we need to change your treatment, we will call you to review the results.   Testing/Procedures: NONE    Follow-Up: At [facility], you and your health needs are our priority.  As part of our continuing mission to provide you with exceptional heart care, we have created designated Provider Care Teams.  These Care Teams include your primary Cardiologist (physician) and Advanced Practice Providers (APPs -  Physician Assistants and Nurse Practitioners) who all work together to provide you with the care you need, when you need it.  We recommend signing up for the patient portal called "MyChart".  Sign up information is provided on this After Visit Summary.  MyChart is used to connect with patients for  Virtual Visits (Telemedicine).  Patients are able to view lab/test results, encounter notes, upcoming appointments, etc.  Non-urgent messages can be sent to your provider as well.   To learn more about what you can do with MyChart, go to [email or web address].    Your next appointment:   6 month(s)  The format for your next appointment:   In Person  Provider:   Rozann Lesches, MD or Bernerd Pho, PA-C    Other Instructions Thank you for choosing [facility]!    Important Information About Sugar         Signed, Erma Heritage, PA-C  03/14/2022 3:22 PM    [place] [address] [place], [place] [address] Phone: [phone] Fax: [phone]

## 2022-03-14 NOTE — Progress Notes (Signed)
[name], [place] [address]   CLINIC:  Medical Oncology/Hematology  PCP:  Luke Squibb, MD [address] [name] [address] [phone]   REASON FOR VISIT:  Follow-up for adrenal mass  PRIOR THERAPY: Radiation therapy from 01/08/2022 through 01/22/2022, 45 Gray in 10 fractions  NGS Results: not done  CURRENT THERAPY: Surveillance.  BRIEF ONCOLOGIC HISTORY:  Oncology History   No history exists.    CANCER STAGING:  Cancer Staging  No matching staging information was found for the patient.  INTERVAL HISTORY:  [name], a 88 y.o. male, returns for follow-up of adrenal mass and anemia from low-grade MDS.  He and his wife are currently at the [facility] assisted living facility about 3 weeks ago as they were not able to take care of each other.  Today [name] is accompanied by his son.  Denies any new onset pains.  REVIEW OF SYSTEMS:  Review of Systems  Gastrointestinal:  Positive for diarrhea.  Psychiatric/Behavioral:  Negative for sleep disturbance.   All other systems reviewed and are negative.   PAST MEDICAL/SURGICAL HISTORY:  Past Medical History:  Diagnosis Date   BPH (benign prostatic hypertrophy)    CHF (congestive heart failure) ([facility])    a. EF at 45-50% in 2016 b. EF at 40-45% by repeat echo in 18/2993   Chronic systolic heart failure ([place])    Complete heart block [facility])    [employer] pacemaker 04/2021 - Dr. Lovena Le   Coronary artery disease    a. Multivessel status post CABG 8/06, LIMA-LAD, SVG-CFX, SVG-distal RCA b. 12/2020: cath showing 3/3 patent grafts with 80% stenosis along LPAV prior to bifurication and medical management recommended.   Essential hypertension    Hyperlipidemia    Ischemic cardiomyopathy    Myelodysplasia, low grade ([facility]) 11/27/2015   Nephrolithiasis    Prostate cancer ([facility])    Vitamin B 12 deficiency 07/27/2016   Past Surgical History:  Procedure Laterality Date   CARDIAC  DEFIBRILLATOR PLACEMENT     COLONOSCOPY     COLONOSCOPY N/A 08/19/2013   Procedure: COLONOSCOPY;  Surgeon: Rogene Houston, MD;  Location: [facility];  Service: Endoscopy;  Laterality: N/A;  37   CORONARY ARTERY BYPASS GRAFT     8/06 with left anterior descending artery, saphenous vein graft to circumflex, saphenous vein graft to distal right coronary artery   CYSTOSCOPY/URETEROSCOPY/HOLMIUM LASER/STENT PLACEMENT Right 11/07/2020   Procedure: CYSTOSCOPY/URETEROSCOPY/HOLMIUM LASER/STENT PLACEMENT;  Surgeon: Alexis Frock, MD;  Location: [facility] OR[facility];  Service: Urology;  Laterality: Right;   HERNIA REPAIR     LEFT HEART CATH AND CORS/GRAFTS ANGIOGRAPHY N/A 12/19/2020   Procedure: LEFT HEART CATH AND CORS/GRAFTS ANGIOGRAPHY;  Surgeon: Leonie Man, MD;  Location: [facility] [place] [facility] LAB;  Service: Cardiovascular;  Laterality: N/A;   PACEMAKER IMPLANT N/A 04/24/2021   Procedure: PACEMAKER IMPLANT;  Surgeon: Evans Lance, MD;  Location: [facility] CV LAB;  Service: Cardiovascular;  Laterality: N/A;   POLYPECTOMY     PROSTATE SURGERY     TEMPORARY PACEMAKER N/A 04/21/2021   Procedure: TEMPORARY PACEMAKER;  Surgeon: Martinique, Peter M, MD;  Location: [facility] LAB;  Service: Cardiovascular;  Laterality: N/A;    SOCIAL HISTORY:  Social History   Socioeconomic History   Marital status: Married    Spouse name: Not on file   Number of children: Not on file   Years of education: Not on file   Highest education  level: Not on file  Occupational History   Occupation: Retired    Fish farm manager: RETIRED  Tobacco Use   Smoking status: Former    Packs/day: 0.25    Years: 44.00    Total pack years: 11.00    Types: Cigarettes    Quit date: 05/14/1992    Years since quitting: 29.8   Smokeless tobacco: Never  Vaping Use   Vaping Use: Never used  Substance and Sexual Activity   Alcohol use: No   Drug use: No   Sexual activity: Not on file    Comment: married 69 years  Other Topics Concern   Not on  file  Social History Narrative   Not on file   Social Determinants of Health   Financial Resource Strain: Not on file  Food Insecurity: Not on file  Transportation Needs: Not on file  Physical Activity: Not on file  Stress: Not on file  Social Connections: Not on file  Intimate Partner Violence: Not on file    FAMILY HISTORY:  Family History  Problem Relation Age of Onset   Colon cancer Father    Coronary artery disease Other     CURRENT MEDICATIONS:  Current Outpatient Medications  Medication Sig Dispense Refill   carvedilol (COREG) 12.5 MG tablet Take 1 tablet (12.5 mg total) by mouth 2 (two) times daily with a meal. 180 tablet 3   Cholecalciferol (VITAMIN D3) 1000 UNITS CAPS Take 1 capsule by mouth daily.     diphenhydrAMINE (BENADRYL) 25 MG tablet Take 25 mg by mouth at bedtime as needed for sleep.     DODEX 1000 MCG/ML injection Inject 1,000 mcg into the skin every 30 (thirty) days.     ENTRESTO 24-26 MG TAKE 1 TABLET BY MOUTH TWICE DAILY 60 tablet 6   furosemide (LASIX) 40 MG tablet TAKE 1 TABLET(40 MG) BY MOUTH DAILY 90 tablet 3   nitroGLYCERIN (NITROSTAT) 0.4 MG SL tablet Place 1 tablet (0.4 mg total) under the tongue every 5 (five) minutes as needed. 25 tablet 3   potassium chloride (KLOR-CON) 10 MEQ tablet Take 1 tablet (10 mEq total) by mouth daily. 30 tablet 0   simvastatin (ZOCOR) 40 MG tablet TAKE 1 TABLET BY MOUTH EVERY DAY IN THE EVENING 90 tablet 2   Vitamin D3 (VITAMIN D) 25 MCG tablet Take 25 mcg by mouth daily.     zinc gluconate 50 MG tablet Take 50 mg by mouth daily.     aspirin EC 81 MG tablet Take 1 tablet (81 mg total) by mouth daily. Swallow whole. 90 tablet 3   No current facility-administered medications for this visit.    ALLERGIES:  Allergies  Allergen Reactions   Codeine Nausea And Vomiting    PHYSICAL EXAM:  Performance status (ECOG): 2 - Symptomatic, <50% confined to bed  Vitals:   03/14/22 1442  Pulse: 66  Resp: 18  Temp: 97.8 F  (36.6 C)  SpO2: 95%   Wt Readings from Last 3 Encounters:  03/14/22 146 lb 3.2 oz (66.3 kg)  03/14/22 146 lb 3.2 oz (66.3 kg)  12/14/21 153 lb 4 oz (69.5 kg)   Physical Exam Vitals reviewed.  Constitutional:      Appearance: Normal appearance.  Cardiovascular:     Rate and Rhythm: Normal rate and regular rhythm.     Pulses: Normal pulses.     Heart sounds: Normal heart sounds.  Pulmonary:     Effort: Pulmonary effort is normal.     Breath sounds: Normal  breath sounds.  Neurological:     General: No focal deficit present.     Mental Status: He is alert and oriented to person, place, and time.  Psychiatric:        Mood and Affect: Mood normal.        Behavior: Behavior normal.      LABORATORY DATA:  I have reviewed the labs as listed.     Latest Ref Rng & Units 03/07/2022   10:53 AM 07/24/2021    1:44 PM 06/22/2021    1:58 PM  CBC  WBC 4.0 - 10.5 K/uL 3.6  4.0  3.6   Hemoglobin 13.0 - 17.0 g/dL 11.8  12.1  12.3   Hematocrit 39.0 - 52.0 % 36.0  38.7  38.7   Platelets 150 - 400 K/uL 111  133  139       Latest Ref Rng & Units 03/07/2022   10:53 AM 08/01/2021   11:04 AM 07/24/2021    1:44 PM  CMP  Glucose 70 - 99 mg/dL 285  119  146   BUN 8 - 23 mg/dL 32  25  25   Creatinine 0.61 - 1.24 mg/dL 1.48  1.15  1.52   Sodium 135 - 145 mmol/L 137  141  140   Potassium 3.5 - 5.1 mmol/L 4.3  4.4  4.8   Chloride 98 - 111 mmol/L 94  105  105   CO2 22 - 32 mmol/L 36  29  27   Calcium 8.9 - 10.3 mg/dL 8.8  9.0  8.7   Total Protein 6.5 - 8.1 g/dL 5.6   6.3   Total Bilirubin 0.3 - 1.2 mg/dL 0.9   0.9   Alkaline Phos 38 - 126 U/L 37   52   AST 15 - 41 U/L 12   11   ALT 0 - 44 U/L 10   8     DIAGNOSTIC IMAGING:  I have independently reviewed the scans and discussed with the patient. No results found.   ASSESSMENT:  1.  Left adrenal mass - During hospitalization last summer (June 2022), CT abdomen/pelvis showed 5.5 cm left adrenal mass with imaging features suspicious for  adrenal carcinoma or metastases.  Oncological work-up was recommended by radiologist, urologist, and hospitalist team, but results were never relayed to oncology clinic until July 2023 when patient's nephrologist (Dr. Theador Hawthorne) brought it to our attention. -PET scan (12/08/2021): Left adrenal mass 8 x 7.7 cm with SUV 5.73.  CT scan previously measured 5.5 x 4.8 cm 1 year ago.  Numerous sclerotic bone lesions mainly in the ribs with no hypermetabolic some.  No evidence of metastatic disease - He denies any cancer in his siblings or parents. - XRT 45 Gray in 10 fractions (01/08/2022 - 01/22/2022)    2.  Low-grade MDS - Bone marrow biopsy on 10/27/2015 showed hypercellular marrow with mild dyserythropoiesis and dismegakaryocytes.  Chromosome analysis was normal.  FISH panel was normal.  Flow cytometry on peripheral blood did not show any monoclonal B cell population. - He has normocytic anemia which is stable between 11 and 12.  - He also has moderate thrombocytopenia which is stable between 80 and 90,000. - Most recent labs (07/24/2021):  Hgb 12.1, WBC 4.0 with ANC 1.5, platelets 133   PLAN:  1.  Left adrenal mass: - He underwent radiation therapy 45 Gray in 10 fractions from 01/08/2022 through 01/22/2022. - His son reports that he has been tired and sleeping on the all day  since radiation therapy. - He is currently resident at [facility] assisted living facility for the past [redacted] weeks along with his wife. - Reviewed his labs today which showed normal LFTs with albumin low at 2.9.  CKD with creatinine 1.48 stable. - Recommend follow-up in 3 months with abdominal imaging without IV contrast.    2.  Low-grade MDS: - Labs from 03/07/2022 shows LDH 205.  Hemoglobin 11.8 and MCV 97.  White count is 3.6 and platelet count 111 and stable. - He also has combination anemia from MDS and CKD/relative iron deficiency. - We will check ferritin and iron panel at next visit in 3 months.    Orders placed this  encounter:  Orders Placed This Encounter  Procedures   CT ABDOMEN LIMITED WO CONTRAST   Comprehensive metabolic panel   CBC with Differential/Platelet   Lactate dehydrogenase   Ferritin   Iron and TIBC      Derek Jack, MD [place] [phone]

## 2022-03-15 ENCOUNTER — Telehealth: Payer: Self-pay | Admitting: Student

## 2022-03-15 DIAGNOSIS — I5022 Chronic systolic (congestive) heart failure: Secondary | ICD-10-CM | POA: Diagnosis not present

## 2022-03-15 DIAGNOSIS — E1122 Type 2 diabetes mellitus with diabetic chronic kidney disease: Secondary | ICD-10-CM | POA: Diagnosis not present

## 2022-03-15 DIAGNOSIS — G3184 Mild cognitive impairment, so stated: Secondary | ICD-10-CM | POA: Diagnosis not present

## 2022-03-15 DIAGNOSIS — N1831 Chronic kidney disease, stage 3a: Secondary | ICD-10-CM | POA: Diagnosis not present

## 2022-03-15 DIAGNOSIS — I130 Hypertensive heart and chronic kidney disease with heart failure and stage 1 through stage 4 chronic kidney disease, or unspecified chronic kidney disease: Secondary | ICD-10-CM | POA: Diagnosis not present

## 2022-03-15 DIAGNOSIS — I2510 Atherosclerotic heart disease of native coronary artery without angina pectoris: Secondary | ICD-10-CM | POA: Diagnosis not present

## 2022-03-15 NOTE — Telephone Encounter (Signed)
Sent to [facility] at [facility].

## 2022-03-15 NOTE — Telephone Encounter (Signed)
[name] calling to follow up new prescription for aspirin. She gave Fax# [phone]

## 2022-03-19 DIAGNOSIS — I5022 Chronic systolic (congestive) heart failure: Secondary | ICD-10-CM | POA: Diagnosis not present

## 2022-03-19 DIAGNOSIS — N1831 Chronic kidney disease, stage 3a: Secondary | ICD-10-CM | POA: Diagnosis not present

## 2022-03-19 DIAGNOSIS — I130 Hypertensive heart and chronic kidney disease with heart failure and stage 1 through stage 4 chronic kidney disease, or unspecified chronic kidney disease: Secondary | ICD-10-CM | POA: Diagnosis not present

## 2022-03-19 DIAGNOSIS — E1122 Type 2 diabetes mellitus with diabetic chronic kidney disease: Secondary | ICD-10-CM | POA: Diagnosis not present

## 2022-03-19 DIAGNOSIS — G3184 Mild cognitive impairment, so stated: Secondary | ICD-10-CM | POA: Diagnosis not present

## 2022-03-19 DIAGNOSIS — I2510 Atherosclerotic heart disease of native coronary artery without angina pectoris: Secondary | ICD-10-CM | POA: Diagnosis not present

## 2022-03-22 DIAGNOSIS — E1122 Type 2 diabetes mellitus with diabetic chronic kidney disease: Secondary | ICD-10-CM | POA: Diagnosis not present

## 2022-03-22 DIAGNOSIS — I130 Hypertensive heart and chronic kidney disease with heart failure and stage 1 through stage 4 chronic kidney disease, or unspecified chronic kidney disease: Secondary | ICD-10-CM | POA: Diagnosis not present

## 2022-03-22 DIAGNOSIS — I5022 Chronic systolic (congestive) heart failure: Secondary | ICD-10-CM | POA: Diagnosis not present

## 2022-03-22 DIAGNOSIS — I2510 Atherosclerotic heart disease of native coronary artery without angina pectoris: Secondary | ICD-10-CM | POA: Diagnosis not present

## 2022-03-22 DIAGNOSIS — N1831 Chronic kidney disease, stage 3a: Secondary | ICD-10-CM | POA: Diagnosis not present

## 2022-03-22 DIAGNOSIS — G3184 Mild cognitive impairment, so stated: Secondary | ICD-10-CM | POA: Diagnosis not present

## 2022-03-28 DIAGNOSIS — I5022 Chronic systolic (congestive) heart failure: Secondary | ICD-10-CM | POA: Diagnosis not present

## 2022-03-28 DIAGNOSIS — E1122 Type 2 diabetes mellitus with diabetic chronic kidney disease: Secondary | ICD-10-CM | POA: Diagnosis not present

## 2022-03-28 DIAGNOSIS — N1831 Chronic kidney disease, stage 3a: Secondary | ICD-10-CM | POA: Diagnosis not present

## 2022-03-28 DIAGNOSIS — I2510 Atherosclerotic heart disease of native coronary artery without angina pectoris: Secondary | ICD-10-CM | POA: Diagnosis not present

## 2022-03-28 DIAGNOSIS — I130 Hypertensive heart and chronic kidney disease with heart failure and stage 1 through stage 4 chronic kidney disease, or unspecified chronic kidney disease: Secondary | ICD-10-CM | POA: Diagnosis not present

## 2022-03-28 DIAGNOSIS — G3184 Mild cognitive impairment, so stated: Secondary | ICD-10-CM | POA: Diagnosis not present

## 2022-04-01 DIAGNOSIS — I2510 Atherosclerotic heart disease of native coronary artery without angina pectoris: Secondary | ICD-10-CM | POA: Diagnosis not present

## 2022-04-01 DIAGNOSIS — G3184 Mild cognitive impairment, so stated: Secondary | ICD-10-CM | POA: Diagnosis not present

## 2022-04-01 DIAGNOSIS — I130 Hypertensive heart and chronic kidney disease with heart failure and stage 1 through stage 4 chronic kidney disease, or unspecified chronic kidney disease: Secondary | ICD-10-CM | POA: Diagnosis not present

## 2022-04-01 DIAGNOSIS — E1122 Type 2 diabetes mellitus with diabetic chronic kidney disease: Secondary | ICD-10-CM | POA: Diagnosis not present

## 2022-04-01 DIAGNOSIS — I5022 Chronic systolic (congestive) heart failure: Secondary | ICD-10-CM | POA: Diagnosis not present

## 2022-04-01 DIAGNOSIS — N1831 Chronic kidney disease, stage 3a: Secondary | ICD-10-CM | POA: Diagnosis not present

## 2022-04-03 DIAGNOSIS — E1122 Type 2 diabetes mellitus with diabetic chronic kidney disease: Secondary | ICD-10-CM | POA: Diagnosis not present

## 2022-04-03 DIAGNOSIS — I130 Hypertensive heart and chronic kidney disease with heart failure and stage 1 through stage 4 chronic kidney disease, or unspecified chronic kidney disease: Secondary | ICD-10-CM | POA: Diagnosis not present

## 2022-04-03 DIAGNOSIS — I5022 Chronic systolic (congestive) heart failure: Secondary | ICD-10-CM | POA: Diagnosis not present

## 2022-04-03 DIAGNOSIS — I2510 Atherosclerotic heart disease of native coronary artery without angina pectoris: Secondary | ICD-10-CM | POA: Diagnosis not present

## 2022-04-03 DIAGNOSIS — N1831 Chronic kidney disease, stage 3a: Secondary | ICD-10-CM | POA: Diagnosis not present

## 2022-04-03 DIAGNOSIS — G3184 Mild cognitive impairment, so stated: Secondary | ICD-10-CM | POA: Diagnosis not present

## 2022-04-09 ENCOUNTER — Other Ambulatory Visit: Payer: Self-pay | Admitting: Student

## 2022-04-17 DIAGNOSIS — M79675 Pain in left toe(s): Secondary | ICD-10-CM | POA: Diagnosis not present

## 2022-04-17 DIAGNOSIS — M79674 Pain in right toe(s): Secondary | ICD-10-CM | POA: Diagnosis not present

## 2022-04-17 DIAGNOSIS — B351 Tinea unguium: Secondary | ICD-10-CM | POA: Diagnosis not present

## 2022-04-26 ENCOUNTER — Ambulatory Visit (INDEPENDENT_AMBULATORY_CARE_PROVIDER_SITE_OTHER): Payer: Medicare Other

## 2022-04-26 DIAGNOSIS — I442 Atrioventricular block, complete: Secondary | ICD-10-CM

## 2022-04-26 LAB — CUP PACEART REMOTE DEVICE CHECK
Battery Remaining Longevity: 114 mo
Battery Voltage: 3.03 V
Brady Statistic AP VP Percent: 30.47 %
Brady Statistic AP VS Percent: 0 %
Brady Statistic AS VP Percent: 67.86 %
Brady Statistic AS VS Percent: 1.66 %
Brady Statistic RA Percent Paced: 31.39 %
Brady Statistic RV Percent Paced: 98.33 %
Date Time Interrogation Session: 20231213214531
Implantable Lead Connection Status: 753985
Implantable Lead Connection Status: 753985
Implantable Lead Implant Date: 20071018
Implantable Lead Implant Date: 20221212
Implantable Lead Location: 753859
Implantable Lead Location: 753860
Implantable Lead Model: 5076
Implantable Lead Model: 6947
Implantable Pulse Generator Implant Date: 20221212
Lead Channel Impedance Value: 247 Ohm
Lead Channel Impedance Value: 285 Ohm
Lead Channel Impedance Value: 361 Ohm
Lead Channel Impedance Value: 361 Ohm
Lead Channel Pacing Threshold Amplitude: 0.5 V
Lead Channel Pacing Threshold Amplitude: 0.625 V
Lead Channel Pacing Threshold Pulse Width: 0.4 ms
Lead Channel Pacing Threshold Pulse Width: 0.4 ms
Lead Channel Sensing Intrinsic Amplitude: 0.375 mV
Lead Channel Sensing Intrinsic Amplitude: 0.375 mV
Lead Channel Sensing Intrinsic Amplitude: 10.375 mV
Lead Channel Sensing Intrinsic Amplitude: 10.375 mV
Lead Channel Setting Pacing Amplitude: 2 V
Lead Channel Setting Pacing Amplitude: 2.5 V
Lead Channel Setting Pacing Pulse Width: 0.4 ms
Lead Channel Setting Sensing Sensitivity: 5.6 mV
Zone Setting Status: 755011
Zone Setting Status: 755011

## 2022-05-03 DIAGNOSIS — N1831 Chronic kidney disease, stage 3a: Secondary | ICD-10-CM | POA: Diagnosis not present

## 2022-05-03 DIAGNOSIS — E782 Mixed hyperlipidemia: Secondary | ICD-10-CM | POA: Diagnosis not present

## 2022-05-03 DIAGNOSIS — E1122 Type 2 diabetes mellitus with diabetic chronic kidney disease: Secondary | ICD-10-CM | POA: Diagnosis not present

## 2022-05-03 DIAGNOSIS — D696 Thrombocytopenia, unspecified: Secondary | ICD-10-CM | POA: Diagnosis not present

## 2022-05-03 DIAGNOSIS — M1A0720 Idiopathic chronic gout, left ankle and foot, without tophus (tophi): Secondary | ICD-10-CM | POA: Diagnosis not present

## 2022-05-03 DIAGNOSIS — I5022 Chronic systolic (congestive) heart failure: Secondary | ICD-10-CM | POA: Diagnosis not present

## 2022-05-03 DIAGNOSIS — E1169 Type 2 diabetes mellitus with other specified complication: Secondary | ICD-10-CM | POA: Diagnosis not present

## 2022-05-03 DIAGNOSIS — Z0000 Encounter for general adult medical examination without abnormal findings: Secondary | ICD-10-CM | POA: Diagnosis not present

## 2022-05-03 DIAGNOSIS — Z951 Presence of aortocoronary bypass graft: Secondary | ICD-10-CM | POA: Diagnosis not present

## 2022-05-03 DIAGNOSIS — R809 Proteinuria, unspecified: Secondary | ICD-10-CM | POA: Diagnosis not present

## 2022-05-03 DIAGNOSIS — I2510 Atherosclerotic heart disease of native coronary artery without angina pectoris: Secondary | ICD-10-CM | POA: Diagnosis not present

## 2022-05-03 DIAGNOSIS — E538 Deficiency of other specified B group vitamins: Secondary | ICD-10-CM | POA: Diagnosis not present

## 2022-05-03 DIAGNOSIS — I130 Hypertensive heart and chronic kidney disease with heart failure and stage 1 through stage 4 chronic kidney disease, or unspecified chronic kidney disease: Secondary | ICD-10-CM | POA: Diagnosis not present

## 2022-05-03 DIAGNOSIS — J302 Other seasonal allergic rhinitis: Secondary | ICD-10-CM | POA: Diagnosis not present

## 2022-05-17 NOTE — Progress Notes (Signed)
Remote pacemaker transmission.   

## 2022-05-22 ENCOUNTER — Emergency Department (HOSPITAL_COMMUNITY): Payer: Medicare Other

## 2022-05-22 ENCOUNTER — Inpatient Hospital Stay (HOSPITAL_COMMUNITY)
Admission: EM | Admit: 2022-05-22 | Discharge: 2022-05-25 | DRG: 177 | Disposition: A | Discharge status: Nursing Home (Includes ICF) | Payer: Medicare Other | Attending: Internal Medicine | Admitting: Internal Medicine

## 2022-05-22 ENCOUNTER — Other Ambulatory Visit: Payer: Self-pay

## 2022-05-22 ENCOUNTER — Encounter (HOSPITAL_COMMUNITY): Payer: Self-pay

## 2022-05-22 ENCOUNTER — Other Ambulatory Visit: Payer: Self-pay | Admitting: Cardiology

## 2022-05-22 DIAGNOSIS — J9601 Acute respiratory failure with hypoxia: Secondary | ICD-10-CM | POA: Diagnosis present

## 2022-05-22 DIAGNOSIS — C61 Malignant neoplasm of prostate: Secondary | ICD-10-CM | POA: Diagnosis present

## 2022-05-22 DIAGNOSIS — F0390 Unspecified dementia without behavioral disturbance: Secondary | ICD-10-CM | POA: Diagnosis present

## 2022-05-22 DIAGNOSIS — N179 Acute kidney failure, unspecified: Secondary | ICD-10-CM | POA: Diagnosis present

## 2022-05-22 DIAGNOSIS — Z8546 Personal history of malignant neoplasm of prostate: Secondary | ICD-10-CM

## 2022-05-22 DIAGNOSIS — Z66 Do not resuscitate: Secondary | ICD-10-CM | POA: Diagnosis present

## 2022-05-22 DIAGNOSIS — N1831 Chronic kidney disease, stage 3a: Secondary | ICD-10-CM | POA: Diagnosis present

## 2022-05-22 DIAGNOSIS — E1122 Type 2 diabetes mellitus with diabetic chronic kidney disease: Secondary | ICD-10-CM | POA: Diagnosis present

## 2022-05-22 DIAGNOSIS — E7849 Other hyperlipidemia: Secondary | ICD-10-CM | POA: Diagnosis not present

## 2022-05-22 DIAGNOSIS — J90 Pleural effusion, not elsewhere classified: Secondary | ICD-10-CM | POA: Diagnosis not present

## 2022-05-22 DIAGNOSIS — R0902 Hypoxemia: Secondary | ICD-10-CM | POA: Diagnosis not present

## 2022-05-22 DIAGNOSIS — J1282 Pneumonia due to coronavirus disease 2019: Secondary | ICD-10-CM | POA: Diagnosis present

## 2022-05-22 DIAGNOSIS — R54 Age-related physical debility: Secondary | ICD-10-CM | POA: Diagnosis present

## 2022-05-22 DIAGNOSIS — E278 Other specified disorders of adrenal gland: Secondary | ICD-10-CM | POA: Diagnosis not present

## 2022-05-22 DIAGNOSIS — E1165 Type 2 diabetes mellitus with hyperglycemia: Secondary | ICD-10-CM | POA: Diagnosis present

## 2022-05-22 DIAGNOSIS — Z85858 Personal history of malignant neoplasm of other endocrine glands: Secondary | ICD-10-CM

## 2022-05-22 DIAGNOSIS — Z95810 Presence of automatic (implantable) cardiac defibrillator: Secondary | ICD-10-CM | POA: Diagnosis not present

## 2022-05-22 DIAGNOSIS — D46Z Other myelodysplastic syndromes: Secondary | ICD-10-CM | POA: Diagnosis present

## 2022-05-22 DIAGNOSIS — Z800 Family history of malignant neoplasm of digestive organs: Secondary | ICD-10-CM

## 2022-05-22 DIAGNOSIS — Z87891 Personal history of nicotine dependence: Secondary | ICD-10-CM | POA: Diagnosis not present

## 2022-05-22 DIAGNOSIS — I480 Paroxysmal atrial fibrillation: Secondary | ICD-10-CM | POA: Diagnosis present

## 2022-05-22 DIAGNOSIS — R17 Unspecified jaundice: Secondary | ICD-10-CM

## 2022-05-22 DIAGNOSIS — I200 Unstable angina: Secondary | ICD-10-CM | POA: Diagnosis present

## 2022-05-22 DIAGNOSIS — M549 Dorsalgia, unspecified: Secondary | ICD-10-CM | POA: Diagnosis not present

## 2022-05-22 DIAGNOSIS — I5082 Biventricular heart failure: Secondary | ICD-10-CM | POA: Diagnosis present

## 2022-05-22 DIAGNOSIS — Z951 Presence of aortocoronary bypass graft: Secondary | ICD-10-CM | POA: Diagnosis not present

## 2022-05-22 DIAGNOSIS — Z8249 Family history of ischemic heart disease and other diseases of the circulatory system: Secondary | ICD-10-CM

## 2022-05-22 DIAGNOSIS — I10 Essential (primary) hypertension: Secondary | ICD-10-CM | POA: Diagnosis not present

## 2022-05-22 DIAGNOSIS — I25110 Atherosclerotic heart disease of native coronary artery with unstable angina pectoris: Secondary | ICD-10-CM | POA: Diagnosis present

## 2022-05-22 DIAGNOSIS — I5022 Chronic systolic (congestive) heart failure: Secondary | ICD-10-CM | POA: Diagnosis present

## 2022-05-22 DIAGNOSIS — R0689 Other abnormalities of breathing: Secondary | ICD-10-CM | POA: Diagnosis not present

## 2022-05-22 DIAGNOSIS — R509 Fever, unspecified: Secondary | ICD-10-CM | POA: Diagnosis not present

## 2022-05-22 DIAGNOSIS — C7492 Malignant neoplasm of unspecified part of left adrenal gland: Secondary | ICD-10-CM | POA: Diagnosis present

## 2022-05-22 DIAGNOSIS — D469 Myelodysplastic syndrome, unspecified: Secondary | ICD-10-CM | POA: Diagnosis present

## 2022-05-22 DIAGNOSIS — Z794 Long term (current) use of insulin: Secondary | ICD-10-CM

## 2022-05-22 DIAGNOSIS — U071 COVID-19: Principal | ICD-10-CM | POA: Diagnosis present

## 2022-05-22 DIAGNOSIS — N400 Enlarged prostate without lower urinary tract symptoms: Secondary | ICD-10-CM | POA: Diagnosis present

## 2022-05-22 DIAGNOSIS — I2510 Atherosclerotic heart disease of native coronary artery without angina pectoris: Secondary | ICD-10-CM | POA: Diagnosis present

## 2022-05-22 DIAGNOSIS — Z79899 Other long term (current) drug therapy: Secondary | ICD-10-CM

## 2022-05-22 DIAGNOSIS — I255 Ischemic cardiomyopathy: Secondary | ICD-10-CM | POA: Diagnosis present

## 2022-05-22 DIAGNOSIS — I130 Hypertensive heart and chronic kidney disease with heart failure and stage 1 through stage 4 chronic kidney disease, or unspecified chronic kidney disease: Secondary | ICD-10-CM | POA: Diagnosis present

## 2022-05-22 DIAGNOSIS — E538 Deficiency of other specified B group vitamins: Secondary | ICD-10-CM | POA: Diagnosis present

## 2022-05-22 DIAGNOSIS — I6529 Occlusion and stenosis of unspecified carotid artery: Secondary | ICD-10-CM | POA: Diagnosis present

## 2022-05-22 DIAGNOSIS — R7401 Elevation of levels of liver transaminase levels: Secondary | ICD-10-CM

## 2022-05-22 DIAGNOSIS — I252 Old myocardial infarction: Secondary | ICD-10-CM

## 2022-05-22 DIAGNOSIS — R000 Tachycardia, unspecified: Secondary | ICD-10-CM | POA: Diagnosis not present

## 2022-05-22 DIAGNOSIS — E1159 Type 2 diabetes mellitus with other circulatory complications: Secondary | ICD-10-CM | POA: Diagnosis present

## 2022-05-22 DIAGNOSIS — R739 Hyperglycemia, unspecified: Secondary | ICD-10-CM | POA: Diagnosis present

## 2022-05-22 DIAGNOSIS — Z7982 Long term (current) use of aspirin: Secondary | ICD-10-CM

## 2022-05-22 DIAGNOSIS — J189 Pneumonia, unspecified organism: Secondary | ICD-10-CM

## 2022-05-22 DIAGNOSIS — Z923 Personal history of irradiation: Secondary | ICD-10-CM

## 2022-05-22 DIAGNOSIS — R079 Chest pain, unspecified: Secondary | ICD-10-CM | POA: Diagnosis not present

## 2022-05-22 LAB — URINALYSIS, ROUTINE W REFLEX MICROSCOPIC
Bacteria, UA: NONE SEEN
Bilirubin Urine: NEGATIVE
Glucose, UA: NEGATIVE mg/dL
Ketones, ur: NEGATIVE mg/dL
Leukocytes,Ua: NEGATIVE
Nitrite: NEGATIVE
Protein, ur: >=300 mg/dL — AB
Specific Gravity, Urine: 1.02 (ref 1.005–1.030)
pH: 5 (ref 5.0–8.0)

## 2022-05-22 LAB — COMPREHENSIVE METABOLIC PANEL
ALT: 71 U/L — ABNORMAL HIGH (ref 0–44)
AST: 66 U/L — ABNORMAL HIGH (ref 15–41)
Albumin: 3.6 g/dL (ref 3.5–5.0)
Alkaline Phosphatase: 83 U/L (ref 38–126)
Anion gap: 11 (ref 5–15)
BUN: 44 mg/dL — ABNORMAL HIGH (ref 8–23)
CO2: 26 mmol/L (ref 22–32)
Calcium: 9.1 mg/dL (ref 8.9–10.3)
Chloride: 103 mmol/L (ref 98–111)
Creatinine, Ser: 1.84 mg/dL — ABNORMAL HIGH (ref 0.61–1.24)
GFR, Estimated: 34 mL/min — ABNORMAL LOW (ref >60)
Glucose, Bld: 238 mg/dL — ABNORMAL HIGH (ref 70–99)
Potassium: 4.3 mmol/L (ref 3.5–5.1)
Sodium: 140 mmol/L (ref 135–145)
Total Bilirubin: 1.4 mg/dL — ABNORMAL HIGH (ref 0.3–1.2)
Total Protein: 6.8 g/dL (ref 6.5–8.1)

## 2022-05-22 LAB — CBC WITH DIFFERENTIAL/PLATELET
Abs Immature Granulocytes: 0 10*3/uL (ref 0.00–0.07)
Basophils Absolute: 0 10*3/uL (ref 0.0–0.1)
Basophils Relative: 0 %
Eosinophils Absolute: 0 10*3/uL (ref 0.0–0.5)
Eosinophils Relative: 0 %
HCT: 35.4 % — ABNORMAL LOW (ref 39.0–52.0)
Hemoglobin: 11.4 g/dL — ABNORMAL LOW (ref 13.0–17.0)
Lymphocytes Relative: 22 %
Lymphs Abs: 1.5 10*3/uL (ref 0.7–4.0)
MCH: 31.8 pg (ref 26.0–34.0)
MCHC: 32.2 g/dL (ref 30.0–36.0)
MCV: 98.6 fL (ref 80.0–100.0)
Monocytes Absolute: 3.4 10*3/uL — ABNORMAL HIGH (ref 0.1–1.0)
Monocytes Relative: 51 %
Neutro Abs: 1.8 10*3/uL (ref 1.7–7.7)
Neutrophils Relative %: 27 %
Platelets: 105 10*3/uL — ABNORMAL LOW (ref 150–400)
RBC: 3.59 MIL/uL — ABNORMAL LOW (ref 4.22–5.81)
RDW: 14.5 % (ref 11.5–15.5)
WBC: 6.6 10*3/uL (ref 4.0–10.5)
nRBC: 0 % (ref 0.0–0.2)

## 2022-05-22 LAB — RESP PANEL BY RT-PCR (RSV, FLU A&B, COVID)  RVPGX2
Influenza A by PCR: NEGATIVE
Influenza B by PCR: NEGATIVE
Resp Syncytial Virus by PCR: NEGATIVE
SARS Coronavirus 2 by RT PCR: POSITIVE — AB

## 2022-05-22 LAB — PROTIME-INR
INR: 1.2 (ref 0.8–1.2)
Prothrombin Time: 14.8 seconds (ref 11.4–15.2)

## 2022-05-22 LAB — HEMOGLOBIN A1C
Hgb A1c MFr Bld: 8.7 % — ABNORMAL HIGH (ref 4.8–5.6)
Mean Plasma Glucose: 202.99 mg/dL

## 2022-05-22 LAB — APTT: aPTT: 27 seconds (ref 24–36)

## 2022-05-22 LAB — PROCALCITONIN: Procalcitonin: 0.21 ng/mL

## 2022-05-22 LAB — LACTIC ACID, PLASMA
Lactic Acid, Venous: 1.1 mmol/L (ref 0.5–1.9)
Lactic Acid, Venous: 1.6 mmol/L (ref 0.5–1.9)

## 2022-05-22 LAB — CBG MONITORING, ED
Glucose-Capillary: 183 mg/dL — ABNORMAL HIGH (ref 70–99)
Glucose-Capillary: 137 mg/dL — ABNORMAL HIGH (ref 70–99)

## 2022-05-22 LAB — GLUCOSE, CAPILLARY: Glucose-Capillary: 144 mg/dL — ABNORMAL HIGH (ref 70–99)

## 2022-05-22 MED ORDER — SACUBITRIL-VALSARTAN 24-26 MG PO TABS
1.0000 | ORAL_TABLET | Freq: Two times a day (BID) | ORAL | Status: DC
  Administered 2022-05-22 – 2022-05-24 (×4): 1 via ORAL
  Filled 2022-05-22 (×5): qty 1

## 2022-05-22 MED ORDER — IPRATROPIUM-ALBUTEROL 20-100 MCG/ACT IN AERS
1.0000 | INHALATION_SPRAY | Freq: Four times a day (QID) | RESPIRATORY_TRACT | Status: DC
  Administered 2022-05-22 – 2022-05-23 (×6): 1 via RESPIRATORY_TRACT
  Filled 2022-05-22: qty 4

## 2022-05-22 MED ORDER — IOHEXOL 350 MG/ML SOLN
60.0000 mL | Freq: Once | INTRAVENOUS | Status: AC | PRN
  Administered 2022-05-22: 60 mL via INTRAVENOUS

## 2022-05-22 MED ORDER — ASPIRIN 81 MG PO TBEC
81.0000 mg | DELAYED_RELEASE_TABLET | Freq: Every day | ORAL | Status: DC
  Administered 2022-05-22 – 2022-05-25 (×4): 81 mg via ORAL
  Filled 2022-05-22 (×4): qty 1

## 2022-05-22 MED ORDER — DEXAMETHASONE SODIUM PHOSPHATE 10 MG/ML IJ SOLN: 8.0000 mg | Freq: Once | INTRAMUSCULAR | Status: DC

## 2022-05-22 MED ORDER — CARVEDILOL 12.5 MG PO TABS
12.5000 mg | ORAL_TABLET | Freq: Two times a day (BID) | ORAL | Status: DC
  Administered 2022-05-22 – 2022-05-25 (×6): 12.5 mg via ORAL
  Filled 2022-05-22 (×6): qty 1

## 2022-05-22 MED ORDER — LACTATED RINGERS IV SOLN: INTRAVENOUS | Status: DC

## 2022-05-22 MED ORDER — VITAMIN C 500 MG PO TABS
500.0000 mg | ORAL_TABLET | Freq: Every day | ORAL | Status: DC
  Administered 2022-05-22 – 2022-05-25 (×4): 500 mg via ORAL
  Filled 2022-05-22 (×4): qty 1

## 2022-05-22 MED ORDER — SODIUM CHLORIDE 0.9 % IV SOLN: 200.0000 mg | Freq: Once | INTRAVENOUS | Status: DC

## 2022-05-22 MED ORDER — DEXAMETHASONE SODIUM PHOSPHATE 10 MG/ML IJ SOLN
6.0000 mg | Freq: Every day | INTRAMUSCULAR | Status: DC
  Administered 2022-05-22: 6 mg via INTRAVENOUS
  Filled 2022-05-22: qty 1

## 2022-05-22 MED ORDER — LINAGLIPTIN 5 MG PO TABS
5.0000 mg | ORAL_TABLET | Freq: Every day | ORAL | Status: DC
  Administered 2022-05-22 – 2022-05-25 (×4): 5 mg via ORAL
  Filled 2022-05-22 (×4): qty 1

## 2022-05-22 MED ORDER — SIMVASTATIN 10 MG PO TABS: 40.0000 mg | ORAL_TABLET | Freq: Every day | ORAL | Status: DC

## 2022-05-22 MED ORDER — SODIUM CHLORIDE 0.9 % IV SOLN: 100.0000 mg | Freq: Every day | INTRAVENOUS | Status: DC

## 2022-05-22 MED ORDER — OXYCODONE HCL 5 MG PO TABS: 5.0000 mg | ORAL_TABLET | Freq: Four times a day (QID) | ORAL | Status: DC | PRN

## 2022-05-22 MED ORDER — SODIUM CHLORIDE 0.9 % IV SOLN
100.0000 mg | Freq: Every day | INTRAVENOUS | Status: AC
  Administered 2022-05-23 – 2022-05-24 (×2): 100 mg via INTRAVENOUS
  Filled 2022-05-22 (×2): qty 20
  Filled 2022-05-22: qty 100

## 2022-05-22 MED ORDER — LACTATED RINGERS IV SOLN: INTRAVENOUS | Status: AC

## 2022-05-22 MED ORDER — ONDANSETRON HCL 4 MG PO TABS: 4.0000 mg | ORAL_TABLET | Freq: Four times a day (QID) | ORAL | Status: DC | PRN

## 2022-05-22 MED ORDER — BISACODYL 5 MG PO TBEC: 5.0000 mg | DELAYED_RELEASE_TABLET | Freq: Every day | ORAL | Status: DC | PRN

## 2022-05-22 MED ORDER — POTASSIUM CHLORIDE ER 10 MEQ PO TBCR
10.0000 meq | EXTENDED_RELEASE_TABLET | Freq: Every day | ORAL | Status: DC
  Administered 2022-05-23 – 2022-05-24 (×2): 10 meq via ORAL
  Filled 2022-05-22 (×5): qty 1

## 2022-05-22 MED ORDER — NYSTATIN 100000 UNIT/GM EX POWD
Freq: Two times a day (BID) | CUTANEOUS | Status: DC
  Filled 2022-05-22 (×2): qty 15

## 2022-05-22 MED ORDER — ZINC SULFATE 220 (50 ZN) MG PO CAPS
220.0000 mg | ORAL_CAPSULE | Freq: Every day | ORAL | Status: DC
  Administered 2022-05-22 – 2022-05-25 (×4): 220 mg via ORAL
  Filled 2022-05-22 (×4): qty 1

## 2022-05-22 MED ORDER — SODIUM CHLORIDE 0.9 % IV SOLN
1.0000 g | Freq: Once | INTRAVENOUS | Status: DC
  Filled 2022-05-22: qty 10

## 2022-05-22 MED ORDER — INSULIN GLARGINE-YFGN 100 UNIT/ML ~~LOC~~ SOLN
10.0000 [IU] | Freq: Every day | SUBCUTANEOUS | Status: DC
  Administered 2022-05-22 – 2022-05-25 (×4): 10 [IU] via SUBCUTANEOUS
  Filled 2022-05-22 (×5): qty 0.1

## 2022-05-22 MED ORDER — GUAIFENESIN-DM 100-10 MG/5ML PO SYRP
10.0000 mL | ORAL_SOLUTION | ORAL | Status: DC | PRN
  Administered 2022-05-24: 10 mL via ORAL
  Filled 2022-05-22: qty 10

## 2022-05-22 MED ORDER — ONDANSETRON HCL 4 MG/2ML IJ SOLN: 4.0000 mg | Freq: Four times a day (QID) | INTRAMUSCULAR | Status: DC | PRN

## 2022-05-22 MED ORDER — SODIUM CHLORIDE 0.9 % IV SOLN
500.0000 mg | Freq: Once | INTRAVENOUS | Status: DC
  Filled 2022-05-22: qty 5

## 2022-05-22 MED ORDER — FUROSEMIDE 20 MG PO TABS
20.0000 mg | ORAL_TABLET | Freq: Every day | ORAL | Status: DC
  Administered 2022-05-22 – 2022-05-24 (×3): 20 mg via ORAL
  Filled 2022-05-22 (×3): qty 1

## 2022-05-22 MED ORDER — INSULIN ASPART 100 UNIT/ML IJ SOLN
0.0000 [IU] | Freq: Three times a day (TID) | INTRAMUSCULAR | Status: DC
  Administered 2022-05-23: 2 [IU] via SUBCUTANEOUS
  Administered 2022-05-23 (×2): 1 [IU] via SUBCUTANEOUS
  Administered 2022-05-24 (×2): 2 [IU] via SUBCUTANEOUS
  Administered 2022-05-25: 1 [IU] via SUBCUTANEOUS
  Filled 2022-05-22 (×2): qty 1

## 2022-05-22 MED ORDER — ENOXAPARIN SODIUM 30 MG/0.3ML IJ SOSY
30.0000 mg | PREFILLED_SYRINGE | Freq: Every day | INTRAMUSCULAR | Status: DC
  Administered 2022-05-22 – 2022-05-25 (×4): 30 mg via SUBCUTANEOUS
  Filled 2022-05-22 (×4): qty 0.3

## 2022-05-22 MED ORDER — FUROSEMIDE 40 MG PO TABS: 20.0000 mg | ORAL_TABLET | Freq: Every day | ORAL | Status: DC

## 2022-05-22 MED ORDER — NITROGLYCERIN 0.4 MG SL SUBL: 0.4000 mg | SUBLINGUAL_TABLET | SUBLINGUAL | Status: DC | PRN

## 2022-05-22 MED ORDER — SODIUM CHLORIDE 0.9 % IV SOLN
100.0000 mg | INTRAVENOUS | Status: AC
  Administered 2022-05-22 (×2): 100 mg via INTRAVENOUS
  Filled 2022-05-22 (×2): qty 20

## 2022-05-22 MED ORDER — TRAZODONE HCL 50 MG PO TABS: 25.0000 mg | ORAL_TABLET | Freq: Every evening | ORAL | Status: DC | PRN

## 2022-05-22 MED ORDER — INSULIN ASPART 100 UNIT/ML IJ SOLN
3.0000 [IU] | Freq: Three times a day (TID) | INTRAMUSCULAR | Status: DC
  Administered 2022-05-22 – 2022-05-25 (×9): 3 [IU] via SUBCUTANEOUS
  Filled 2022-05-22 (×2): qty 1

## 2022-05-22 MED ORDER — ACETAMINOPHEN 325 MG PO TABS
650.0000 mg | ORAL_TABLET | Freq: Four times a day (QID) | ORAL | Status: DC | PRN
  Administered 2022-05-22: 650 mg via ORAL
  Filled 2022-05-22: qty 2

## 2022-05-22 NOTE — ED Triage Notes (Signed)
Pt arrived by EMS from [place], initially called out for back pain but on arrival pt having low O2 sats at 85% RA and a fever of 101.4   1g Rocephin given IM by EMS  20 G IV left wrist by EMS   Pt is alert and oriented on arrival to ED, states that his breathing and back feel better after being placed on oxygen

## 2022-05-22 NOTE — H&P (Signed)
History and Physical  [address]  [name] VCB:[id number] DOB: May 24, 1926 DOA: 05/22/2022  PCP: [name], MD  Patient coming from: [name] Level of care: Med-Surg  I have personally briefly reviewed patient's old medical records in [place]  Chief Complaint: Shortness of breath and hypoxia  HPI: [name] is a 89 year old gentleman who is DNR with significant ischemic cardiomyopathy EF about 35%, chronic biventricular heart failure, hypertension, coronary artery disease, carotid artery disease, BPH, Medtronic ICD in place, hyperlipidemia, myelodysplasia, prostate cancer, left adrenal cancer, B12 deficiency, who is a former smoker who quit 30 years ago was sent from [facility] where he is a long-term resident for shortness of breath symptoms.  The patient is vaccinated and boosted for COVID-19.  He was noted to be hypoxic on room air 85% and having a fever of 101.4.  EMS was concerned that he had pneumonia due to Rales on exam.  He was given IM Rocephin 1 g x 1 by EMS.  In the emergency department he was placed on supplemental oxygen with improvement in pulse ox.  His SARS 2 coronavirus testing was positive.  He was negative for RSV and influenza.  He had a normal lactic acid.  His white blood cell count was normal.  His chest x-ray showed findings of infiltrates.  His blood glucose was elevated.  He was given IV steroids and admission requested for further management of COVID associated pneumonia.    Past Medical History:  Diagnosis Date   BPH (benign prostatic hypertrophy)    CHF (congestive heart failure) ([facility])    a. EF at 45-50% in 2016 b. EF at 40-45% by repeat echo in 38/4665   Chronic systolic heart failure ([place])    Complete heart block [facility])    Medtronic pacemaker 04/2021 - Dr. Lovena Le   Coronary artery disease    a. Multivessel status post CABG 8/06, LIMA-LAD, SVG-CFX, SVG-distal RCA b. 12/2020: cath showing 3/3 patent  grafts with 80% stenosis along LPAV prior to bifurication and medical management recommended.   Essential hypertension    Hyperlipidemia    Ischemic cardiomyopathy    Myelodysplasia, low grade ([place]) 11/27/2015   Nephrolithiasis    Prostate cancer ([place])    Vitamin B 12 deficiency 07/27/2016    Past Surgical History:  Procedure Laterality Date   CARDIAC DEFIBRILLATOR PLACEMENT     COLONOSCOPY     COLONOSCOPY N/A 08/19/2013   Procedure: COLONOSCOPY;  Surgeon: Rogene Houston, MD;  Location: [facility];  Service: Endoscopy;  Laterality: N/A;  61   CORONARY ARTERY BYPASS GRAFT     8/06 with left anterior descending artery, saphenous vein graft to circumflex, saphenous vein graft to distal right coronary artery   CYSTOSCOPY/URETEROSCOPY/HOLMIUM LASER/STENT PLACEMENT Right 11/07/2020   Procedure: CYSTOSCOPY/URETEROSCOPY/HOLMIUM LASER/STENT PLACEMENT;  Surgeon: Alexis Frock, MD;  Location: [facility] ORS;  Service: Urology;  Laterality: Right;   HERNIA REPAIR     LEFT HEART CATH AND CORS/GRAFTS ANGIOGRAPHY N/A 12/19/2020   Procedure: LEFT HEART CATH AND CORS/GRAFTS ANGIOGRAPHY;  Surgeon: Leonie Man, MD;  Location: [place] CV LAB;  Service: Cardiovascular;  Laterality: N/A;   PACEMAKER IMPLANT N/A 04/24/2021   Procedure: PACEMAKER IMPLANT;  Surgeon: Evans Lance, MD;  Location: [place] CV LAB;  Service: Cardiovascular;  Laterality: N/A;   POLYPECTOMY     PROSTATE SURGERY     TEMPORARY PACEMAKER N/A 04/21/2021   Procedure: TEMPORARY PACEMAKER;  Surgeon: Martinique, Peter M,  MD;  Location: [place] CV LAB;  Service: Cardiovascular;  Laterality: N/A;     reports that he quit smoking about 30 years ago. His smoking use included cigarettes. He has a 11.00 pack-year smoking history. He has never used smokeless tobacco. He reports that he does not drink alcohol and does not use drugs.  Allergies  Allergen Reactions   Codeine Nausea And Vomiting    Family History  Problem Relation Age  of Onset   Colon cancer Father    Coronary artery disease Other     Prior to Admission medications   Medication Sig Start Date End Date Taking? Authorizing Provider  aspirin EC 81 MG tablet Take 1 tablet (81 mg total) by mouth daily. Swallow whole. 03/14/22  Yes Strader, [place], PA-C  carvedilol (COREG) 12.5 MG tablet TAKE 1 TABLET(12.5 MG) BY MOUTH TWICE DAILY WITH A MEAL 04/09/22  Yes Satira Sark, MD  ENTRESTO 24-26 MG TAKE 1 TABLET BY MOUTH TWICE DAILY 01/31/22  Yes Satira Sark, MD  furosemide (LASIX) 40 MG tablet TAKE 1 TABLET(40 MG) BY MOUTH DAILY Patient taking differently: Take 20 mg by mouth daily. 07/28/21  Yes Satira Sark, MD  insulin degludec (TRESIBA FLEXTOUCH) 100 UNIT/ML FlexTouch Pen Inject 10 Units into the skin daily.   Yes [provider]  nitroGLYCERIN (NITROSTAT) 0.4 MG SL tablet Place 1 tablet (0.4 mg total) under the tongue every 5 (five) minutes as needed. 06/08/19  Yes Imogene Burn, PA-C  potassium chloride (KLOR-CON) 10 MEQ tablet Take 1 tablet (10 mEq total) by mouth daily. 06/22/21  Yes Strader, [place] M, PA-C  simvastatin (ZOCOR) 40 MG tablet TAKE 1 TABLET BY MOUTH EVERY DAY IN THE EVENING 06/26/21  Yes Satira Sark, MD    Physical Exam: Vitals:   05/22/22 0730 05/22/22 0750 05/22/22 0800 05/22/22 0900  BP: 134/62 134/62 117/63 (!) 119/54  Pulse: 96 96 94 93  Resp: (!) 24 20 (!) 24   Temp:  99.7 F (37.6 C)    TempSrc:  Oral    SpO2: 98% 94% 99% 97%  Weight:      Height:        Constitutional: frail, elderly male, chronically ill-appearing, NAD, calm, Uncomfortable and groaning. Eyes: PERRL, lids and conjunctivae normal ENMT: Mucous membranes are moist. Posterior pharynx clear of any exudate or lesions. Neck: normal, supple, no masses, no thyromegaly Respiratory: diffuse rales heard bilateral, moderate increased work of breathing. Cardiovascular: normal s1, s2 sounds, no murmurs / rubs / gallops.  1+ extremity  edema. 2+ pedal pulses. No carotid bruits.  Abdomen: no tenderness, no masses palpated. No hepatosplenomegaly. Bowel sounds positive.  Musculoskeletal: no clubbing / cyanosis. No joint deformity upper and lower extremities. Good ROM, no contractures. Normal muscle tone.  Skin: Diffuse age-related changes, No induration Neurologic: CN 2-12 grossly intact. Sensation intact, DTR normal. Strength 5/5 in all 4.  Psychiatric: Normal judgment and insight. Alert and oriented x 3. Normal mood.   Labs on Admission: I have personally reviewed following labs and imaging studies  CBC: Recent Labs  Lab 05/22/22 0628  WBC 6.6  NEUTROABS 1.8  HGB 11.4*  HCT 35.4*  MCV 98.6  PLT 542*   Basic Metabolic Panel: Recent Labs  Lab 05/22/22 0628  NA 140  K 4.3  CL 103  CO2 26  GLUCOSE 238*  BUN 44*  CREATININE 1.84*  CALCIUM 9.1   GFR: Estimated Creatinine Clearance: 22.6 mL/min (A) (by C-G formula based on  SCr of 1.84 mg/dL (H)). Liver Function Tests: Recent Labs  Lab 05/22/22 0628  AST 66*  ALT 71*  ALKPHOS 83  BILITOT 1.4*  PROT 6.8  ALBUMIN 3.6   No results for input(s): "LIPASE", "AMYLASE" in the last 168 hours. No results for input(s): "AMMONIA" in the last 168 hours. Coagulation Profile: Recent Labs  Lab 05/22/22 0628  INR 1.2   Cardiac Enzymes: No results for input(s): "CKTOTAL", "CKMB", "CKMBINDEX", "TROPONINI" in the last 168 hours. BNP (last 3 results) No results for input(s): "PROBNP" in the last 8760 hours. HbA1C: No results for input(s): "HGBA1C" in the last 72 hours. CBG: No results for input(s): "GLUCAP" in the last 168 hours. Lipid Profile: No results for input(s): "CHOL", "HDL", "LDLCALC", "TRIG", "CHOLHDL", "LDLDIRECT" in the last 72 hours. Thyroid Function Tests: No results for input(s): "TSH", "T4TOTAL", "FREET4", "T3FREE", "THYROIDAB" in the last 72 hours. Anemia Panel: No results for input(s): "VITAMINB12", "FOLATE", "FERRITIN", "TIBC", "IRON",  "RETICCTPCT" in the last 72 hours. Urine analysis:    Component Value Date/Time   COLORURINE AMBER (A) 05/22/2022 0628   APPEARANCEUR HAZY (A) 05/22/2022 0628   LABSPEC 1.020 05/22/2022 0628   PHURINE 5.0 05/22/2022 0628   GLUCOSEU NEGATIVE 05/22/2022 0628   HGBUR MODERATE (A) 05/22/2022 0628   BILIRUBINUR NEGATIVE 05/22/2022 0628   KETONESUR NEGATIVE 05/22/2022 0628   PROTEINUR >=300 (A) 05/22/2022 0628   NITRITE NEGATIVE 05/22/2022 0628   LEUKOCYTESUR NEGATIVE 05/22/2022 0628    Radiological Exams on Admission: CT Angio Chest PE W and/or Wo Contrast  Result Date: 05/22/2022 CLINICAL DATA:  89 year old with upper back pain. Pulmonary embolism suspected, high probability. EXAM: CT ANGIOGRAPHY CHEST WITH CONTRAST TECHNIQUE: Multidetector CT imaging of the chest was performed using the standard protocol during bolus administration of intravenous contrast. Multiplanar CT image reconstructions and MIPs were obtained to evaluate the vascular anatomy. RADIATION DOSE REDUCTION: This exam was performed according to the departmental dose-optimization program which includes automated exposure control, adjustment of the mA and/or kV according to patient size and/or use of iterative reconstruction technique. CONTRAST:  26m OMNIPAQUE IOHEXOL 350 MG/ML SOLN COMPARISON:  PET-CT 11/30/2021 FINDINGS: Cardiovascular: Negative for pulmonary embolism. Post CABG changes with a left chest ICD. Coronary arteries are heavily calcified. Heart size is within normal limits and stable. Mediastinum/Nodes: Small mediastinal lymph nodes. Overall, no significant lymph node enlargement in the mediastinum, hila or axillary regions. Lungs/Pleura: Small bilateral pleural effusions, right side greater than left. Right pleural effusion is minimally changed in size. Left pleural effusion is minimally enlarged. Motion artifact limits evaluation of the lungs. Subtle parenchymal densities in the right lung. 4 mm nodular density left  upper lobe on image 50/6 is probably minimally changed but this area is difficult to evaluate due to significant motion artifact. Upper Abdomen: Again noted is a large left adrenal mass that measures approximately 7.9 x 7.9 cm and minimally changed from the previous PET-CT. This is suggestive for primary adrenal neoplasm. Right adrenal gland is within normal limits. Calcified gallstones. Musculoskeletal: Old right rib fractures. Old left posterior eleventh rib fracture. Mild deformity and sclerosis involving the T2 vertebral body appears to be similar from the previous PET-CT. No evidence for an acute vertebral body compression fracture. Review of the MIP images confirms the above findings. IMPRESSION: 1. Negative for pulmonary embolism. 2. Subtle parenchymal densities in the right lung. Limited evaluation of the lungs due to motion artifact but cannot exclude an infectious etiology. 3. Chronic small bilateral pleural effusions that have minimally changed  since 11/30/2021. 4. No significant change in the large left adrenal mass. 5. Old bilateral rib fractures.  No acute bone abnormality. 6. Cholelithiasis. Electronically Signed   By: Markus Daft M.D.   On: 05/22/2022 08:52   DG Chest Port 1 View  Result Date: 05/22/2022 CLINICAL DATA:  89 year old male with possible sepsis. Back pain. Low oxygen saturations. EXAM: PORTABLE CHEST 1 VIEW COMPARISON:  Chest x-ray 04/24/2021. FINDINGS: Widespread areas of interstitial prominence, diffuse peribronchial cuffing and patchy ill-defined opacities are noted in the lungs bilaterally (right greater than left), concerning for bronchitis with likely developing multilobar bilateral bronchopneumonia. No confluent consolidative airspace disease. No pleural effusions. No pneumothorax. No evidence of pulmonary edema. Heart size is mildly enlarged with prominence of the left ventricular contour, suggesting left ventricular hypertrophy. Upper mediastinal contours are within normal  limits. Atherosclerotic calcifications are noted in the thoracic aorta. Status post median sternotomy. Left-sided pacemaker/AICD with lead tips projecting over the expected location of the right atrium and right ventricle. Old healed right-sided rib fractures are incidentally noted. IMPRESSION: 1. The appearance the chest is concerning for bronchitis with developing multilobar bilateral bronchopneumonia. Followup PA and lateral chest X-ray is recommended in 3-4 weeks following trial of antibiotic therapy to ensure resolution and exclude underlying malignancy. 2. Cardiomegaly with probable left ventricular hypertrophy. 3. Aortic atherosclerosis. Electronically Signed   By: Vinnie Langton M.D.   On: 05/22/2022 07:32    EKG: Independently reviewed.   Assessment/Plan Principal Problem:   Pneumonia due to COVID-19 virus Active Problems:   Acute respiratory failure with hypoxia (HCC)   PROSTATE CANCER   Other hyperlipidemia   Essential hypertension   MYOCARDIAL INFARCTION, HX OF   Coronary artery disease   Ischemic cardiomyopathy   SYSTOLIC HEART FAILURE, CHRONIC   Automatic implantable cardioverter-defibrillator in situ   Carotid stenosis   Myelodysplasia, low grade (HCC)   Unstable angina (HCC)   Left adrenal mass (HCC)   Adrenal carcinoma, left (HCC)   DNR (do not resuscitate)   Hyperglycemia   Type 2 diabetes mellitus with vascular disease ([place])   Acute respiratory failure with hypoxia -Secondary to COVID-pneumonia -continue supplemental oxygen and covid treatments  Covid Pneumonia -procalcitonin is reassuring that this is likely all viral infection -given hypoxia, advanced age, co-morbidities, infiltrates on CXR starting IV remdesivir -continue IV decadron 6 mg daily x 10 days -continue bronchodilators as ordered -continue mucolytics and cough suppressants as ordered -follow and trend inflammatory markers until trending down -continue respiratory isolation  -gentle hydration  only given history of cardiomyopathy  -supplemental oxygen with goal pulse ox >87%  -prone positioning as able -ambulate in room, up to chair  -vitamin supplements per latest covid treatment protocol   DNR with documents -continue DNR order while inpatient   Type 2 Diabetes mellitus with hyperglycemia, uncontrolled -update A1c  -anticipating steroid induced hyperglycemia -added linagliptin 5 mg daily while in hospital -SSI and prandial insulin coverage ordered, titrate doses as required  -monitor CBG 5 times per day  Ischemic Cardiomyopathy  -resumed all home cardiac medications -he is not demonstrating s/s of decompensation at this time -he did receive quite a lot of IV fluid in ED and I was able to stop that when assuming care -resume home lasix daily with potassium supplement  -monitor intake and output and daily weight -monitor electrolytes and renal function   MDS/left adrenal mass/prostate cancer -outpatient follow up with Dr. Delton Coombes after discharge  Hyperlipidemia -temporarily hold home simvastatin given mild bump in LFTs  Acute Transaminitis  -likely due to acute covid infection  -monitoring labs daily   DVT prophylaxis: renal dose enoxaparin  Code Status: DNR   Family Communication: t/c to son   Disposition Plan: return to [facility] when medically stabilized   Consults called:   Admission status: INP  Level of care: Med-Surg Irwin Brakeman MD [facility] How to contact the [facility] Attending or Consulting provider [place] or covering provider during after hours [place], for this patient?  Check the care team in [facility] and look for a) attending/consulting [facility] provider listed and b) the [facility] team listed Log into [email or web address] and use [place]'s universal password to access. If you do not have the password, please contact the hospital operator. Locate the [facility] provider you are looking for under [facility] and page to a number that you can be directly  reached. If you still have difficulty reaching the provider, please page the [facility] (Director on Call) for the Hospitalists listed on amion for assistance.   If 7PM-7AM, please contact night-coverage [email or web address] Password [facility]  05/22/2022, 10:41 AM

## 2022-05-22 NOTE — ED Provider Notes (Signed)
[facility] EMERGENCY DEPARTMENT Provider Note   CSN: [id number] Arrival date & time: 05/22/22  6644     History  No chief complaint on file.   [name] is a 89 y.o. male.  The history is provided by the EMS personnel and the nursing home. The history is limited by the condition of the patient (Dementia).  He has history of hypertension, hyperlipidemia, systolic heart failure, prostate cancer, complete heart block status post pacemaker insertion and was brought in by EMS as probable sepsis.  EMS was called to the nursing facility because of complaints of back pain.  EMS noted fever and tachycardia to an extent which is met SIRS criteria.  They gave him an injection of ceftriaxone.  There was no history of fever at the nursing home.   Home Medications Prior to Admission medications   Medication Sig Start Date End Date Taking? Authorizing Provider  aspirin EC 81 MG tablet Take 1 tablet (81 mg total) by mouth daily. Swallow whole. 03/14/22   Strader, Fransisco Hertz, PA-C  carvedilol (COREG) 12.5 MG tablet TAKE 1 TABLET(12.5 MG) BY MOUTH TWICE DAILY WITH A MEAL 04/09/22   Satira Sark, MD  Cholecalciferol (VITAMIN D3) 1000 UNITS CAPS Take 1 capsule by mouth daily.    [provider]  diphenhydrAMINE (BENADRYL) 25 MG tablet Take 25 mg by mouth at bedtime as needed for sleep.    [provider]  DODEX 1000 MCG/ML injection Inject 1,000 mcg into the skin every 30 (thirty) days. 12/26/21   [provider]  ENTRESTO 24-26 MG TAKE 1 TABLET BY MOUTH TWICE DAILY 01/31/22   Satira Sark, MD  furosemide (LASIX) 40 MG tablet TAKE 1 TABLET(40 MG) BY MOUTH DAILY 07/28/21   Satira Sark, MD  nitroGLYCERIN (NITROSTAT) 0.4 MG SL tablet Place 1 tablet (0.4 mg total) under the tongue every 5 (five) minutes as needed. 06/08/19   Imogene Burn, PA-C  potassium chloride (KLOR-CON) 10 MEQ tablet Take 1 tablet (10 mEq total) by mouth daily. 06/22/21   Strader, Fransisco Hertz,  PA-C  simvastatin (ZOCOR) 40 MG tablet TAKE 1 TABLET BY MOUTH EVERY DAY IN THE EVENING 06/26/21   Satira Sark, MD  Vitamin D3 (VITAMIN D) 25 MCG tablet Take 25 mcg by mouth daily. 05/22/21   [provider]  zinc gluconate 50 MG tablet Take 50 mg by mouth daily. 05/22/21   [provider]      Allergies    Codeine    Review of Systems   Review of Systems  Unable to perform ROS: Dementia    Physical Exam Updated Vital Signs BP (!) 144/83   Pulse (!) 101   Temp 99.5 F (37.5 C) (Oral)   Resp (!) 28   Ht '5\' 6"'$  (1.676 m)   Wt 75 kg   SpO2 97%   BMI 26.69 kg/m  Physical Exam Vitals and nursing note reviewed.   89 year old male, resting comfortably and in no acute distress. Vital signs are significant for borderline tachycardia, elevated blood pressure, elevated respiratory rate. Oxygen saturation is 85%, which is hypoxic. Head is normocephalic and atraumatic. PERRLA, EOMI. Oropharynx is clear. Neck is nontender and supple without adenopathy or JVD. Back is nontender and there is no CVA tenderness.  There is no presacral edema. Lungs are clear without rales, wheezes, or rhonchi. Chest is nontender. Heart has regular rate and rhythm without murmur. Abdomen is soft, flat, nontender. Extremities have 2+ pedal and  pretibial edema, full range of motion is present. Skin is warm and dry without rash. Neurologic: Awake and alert but nonverbal.  Cranial nerves are grossly intact.  He moves all extremities equally.  ED Results / Procedures / Treatments   Labs (all labs ordered are listed, but only abnormal results are displayed) Labs Reviewed  CULTURE, BLOOD (ROUTINE X 2)  CULTURE, BLOOD (ROUTINE X 2)  URINE CULTURE  LACTIC ACID, PLASMA  LACTIC ACID, PLASMA  COMPREHENSIVE METABOLIC PANEL  CBC WITH DIFFERENTIAL/PLATELET  PROTIME-INR  APTT  URINALYSIS, ROUTINE W REFLEX MICROSCOPIC    EKG EKG Interpretation  Date/Time:  Tuesday May 22 2022 06:28:14  EST Ventricular Rate:  101 PR Interval:  161 QRS Duration: 155 QT Interval:  370 QTC Calculation: 480 R Axis:   187 Text Interpretation: AV dual-paced rhythm Atrial premature complexes 04/25/2021, No significant change was found Confirmed by Delora Fuel ([id number]) on 05/22/2022 6:51:35 AM  Radiology No results found.  Procedures Procedures  Cardiac monitor showed's sinus tachycardia, per my interpretation.  Medications Ordered in ED Medications  lactated ringers infusion (has no administration in time range)    ED Course/ Medical Decision Making/ A&P Clinical Course as of 05/22/22 0739  Tue May 22, 2022  0705 Sepsis, hypoxic, pending everything [MK]    Clinical Course User Index [MK] Kommor, Debe Coder, MD                           Medical Decision Making Amount and/or Complexity of Data Reviewed Labs: ordered. Radiology: ordered. ECG/medicine tests: ordered.  Risk Prescription drug management.   Report of fever and SIRS criteria, possible sepsis.  Acute respiratory failure with hypoxia.  Report of back pain but no complaints of the same in the emergency department.  I am concerned about possible pneumonia.  Also, her respiratory infection such as COVID-19, influenza, RSV.  I have started the patient on the evolving sepsis pathway.  I have reviewed and interpreted his electrocardiogram and my interpretation is AV dual paced rhythm unchanged from prior.  Chest x-ray and labs are pending.    Chest x-ray is read by radiologist as acute bronchitis with concern for developing pneumonia.  In the setting of hypoxia, it is most likely that he has pneumonia.  I have reviewed and interpreted his metabolic panel and my interpretation is acute kidney injury with creatinine increased from 1.48 on 03/07/2022  to 1.84 today.  Also, new elevation of transaminases and total bilirubin.  CBC is pending.  Lactic acid level is normal.  In light of probable pneumonia, I have ordered IV azithromycin and  additional IV ceftriaxone to bring the total dose up to 2 g.  Case is signed out to Dr. Matilde Sprang, oncoming physician.  CRITICAL CARE Performed by: Delora Fuel Total critical care time: 50 minutes Critical care time was exclusive of separately billable procedures and treating other patients. Critical care was necessary to treat or prevent imminent or life-threatening deterioration. Critical care was time spent personally by me on the following activities: development of treatment plan with patient and/or surrogate as well as nursing, discussions with consultants, evaluation of patient's response to treatment, examination of patient, obtaining history from patient or surrogate, ordering and performing treatments and interventions, ordering and review of laboratory studies, ordering and review of radiographic studies, pulse oximetry and re-evaluation of patient's condition.  Final Clinical Impression(s) / ED Diagnoses Final diagnoses:  Acute hypoxemic respiratory failure ([place])  Community acquired pneumonia, unspecified laterality  Acute kidney injury (nontraumatic) (HCC)  Elevated transaminase level  Serum total bilirubin elevated    Rx / DC Orders ED Discharge Orders     None         Delora Fuel, MD 32/20/25 [phone]

## 2022-05-22 NOTE — ED Provider Notes (Signed)
  Physical Exam  BP (!) 119/54   Pulse 93   Temp 99.7 F (37.6 C) (Oral)   Resp (!) 24   Ht '5\' 6"'$  (1.676 m)   Wt 75 kg   SpO2 97%   BMI 26.69 kg/m   Physical Exam Constitutional:      General: He is not in acute distress.    Appearance: Normal appearance.  HENT:     Head: Normocephalic and atraumatic.     Nose: No congestion or rhinorrhea.  Eyes:     General:        Right eye: No discharge.        Left eye: No discharge.     Extraocular Movements: Extraocular movements intact.     Pupils: Pupils are equal, round, and reactive to light.  Cardiovascular:     Rate and Rhythm: Normal rate and regular rhythm.     Heart sounds: No murmur heard. Pulmonary:     Effort: No respiratory distress.     Breath sounds: Rales present. No wheezing.  Abdominal:     General: There is no distension.     Tenderness: There is no abdominal tenderness.  Musculoskeletal:        General: Normal range of motion.     Cervical back: Normal range of motion.  Skin:    General: Skin is warm and dry.  Neurological:     General: No focal deficit present.     Mental Status: He is alert.     Procedures  .Critical Care  Performed by: Teressa Lower, MD Authorized by: Teressa Lower, MD   Critical care provider statement:    Critical care time (minutes):  30   Critical care was necessary to treat or prevent imminent or life-threatening deterioration of the following conditions:  Respiratory failure   Critical care was time spent personally by me on the following activities:  Development of treatment plan with patient or surrogate, discussions with consultants, evaluation of patient's response to treatment, examination of patient, ordering and review of laboratory studies, ordering and review of radiographic studies, ordering and performing treatments and interventions, pulse oximetry, re-evaluation of patient's condition and review of old charts   ED Course / MDM   Clinical Course as of  05/22/22 0913  Tue May 22, 2022  0705 Sepsis, hypoxic, pending everything [MK]    Clinical Course User Index [MK] Bernese Doffing, Debe Coder, MD   Medical Decision Making Amount and/or Complexity of Data Reviewed Labs: ordered. Radiology: ordered. ECG/medicine tests: ordered.  Risk Prescription drug management.   Patient received in handoff.  Hypoxia and EMS reported fever pending imaging and laboratory evaluation.  Patient found to be COVID-positive with no other source of infection.  Patient requiring 2 L nasal cannula which is new for the patient.  Decadron initiated and patient require hospital admission for COVID hypoxia.       Teressa Lower, MD 05/22/22 [phone]

## 2022-05-22 NOTE — TOC Initial Note (Addendum)
Transition of Care [facility]) - Initial/Assessment Note    Patient Details  Name: [name] MRN: [id number] Date of Birth: 16-Sep-1926  Transition of Care [facility]) CM/SW Contact:    Luke Arnt, LCSW Phone Number: 05/22/2022, 11:04 AM  Clinical Narrative: Pt admitted from [facility]. COVID +. Assessment completed with pt's son, [name]. [name] is also a resident at [facility]. Per [name] at facility, pt requires limited assist with bathing and dressing. No current home health services. Okay to return. Tim requests return to [facility] when medically stable. TOC will continue to follow.                   Expected Discharge Plan: Assisted Living Barriers to Discharge: Continued Medical Work up   Patient Goals and CMS Choice Patient states their goals for this hospitalization and ongoing recovery are:: return to [facility]   Choice offered to / list presented to : Adult [place] ownership interest in [facility].provided to::  (n/a)    Expected Discharge Plan and Services In-house Referral: Clinical Social Work     Living arrangements for the past 2 months: [place]                                      Prior Living Arrangements/Services Living arrangements for the past 2 months: [place] Lives with:: Facility Resident Patient language and need for interpreter reviewed:: Yes Do you feel safe going back to the place where you live?: Yes      Need for Family Participation in Patient Care: Yes (Comment) Care giver support system in place?: Yes (comment) Current home services: DME (hospital bed, wheelchair) Criminal Activity/Legal Involvement Pertinent to Current Situation/Hospitalization: No - Comment as needed  Activities of Daily Living      Permission Sought/Granted                  Emotional Assessment         Alcohol / Substance Use: Illicit Drugs Psych Involvement: No  (comment)  Admission diagnosis:  Pneumonia due to COVID-19 virus [U07.1, J12.82] Patient Active Problem List   Diagnosis Date Noted   Pneumonia due to COVID-19 virus 05/22/2022   DNR (do not resuscitate) 05/22/2022   Hyperglycemia 05/22/2022   Type 2 diabetes mellitus with vascular disease ([place]) 05/22/2022   Adrenal carcinoma, left ([place]) 12/14/2021   Left adrenal mass ([place]) 11/27/2021   Pressure injury of skin 04/20/2021   Bradycardia 04/19/2021   Heart block AV complete ([place]) 04/18/2021   Acute combined systolic and diastolic congestive heart failure ([place])    Sepsis due to undetermined organism ([place]) 04/13/2021   Lobar pneumonia ([employer]) 04/13/2021   Acute respiratory failure with hypoxia and hypercarbia ([place]) 04/13/2021   Acute on chronic congestive heart failure ([place]) 04/12/2021   Acute respiratory failure with hypoxia ([place]) 04/12/2021   Unstable angina ([place])    Family history of CABG    Chest pain 12/16/2020   Acute renal failure superimposed on stage 3 chronic kidney disease, unspecified acute renal failure type, unspecified whether stage 3a or 3b CKD ([place]) 11/07/2020   AKI (acute kidney injury) ([place]) 11/07/2020   Acute renal failure (ARF) ([place]) 11/07/2020   Gout involving toe 05/26/2019   Vitamin B 12 deficiency 07/27/2016   Myelodysplasia, low grade ([place]) 11/27/2015   Other primary cardiomyopathies 08/07/2011   Carotid stenosis 07/26/2011   Automatic  implantable cardioverter-defibrillator in situ 07/26/2009   CAD, AUTOLOGOUS BYPASS GRAFT 08/24/2008   PROSTATE CANCER 08/03/2008   Other hyperlipidemia 08/03/2008   Essential hypertension 08/03/2008   MYOCARDIAL INFARCTION, HX OF 08/03/2008   Coronary artery disease 08/03/2008   Ischemic cardiomyopathy 75/79/7282   SYSTOLIC HEART FAILURE, CHRONIC 08/03/2008   NEPHROLITHIASIS, HX OF 08/03/2008   POSTSURGICAL AORTOCORONARY BYPASS STATUS 08/03/2008   PCP:  [name], MD Pharmacy:   [facility], [address] AT [place] [place] [address] Phone: [phone] Fax: [phone]     Social Determinants of Health (SDOH) Social History: SDOH Screenings   Depression (PHQ2-9): Low Risk  (05/26/2019)  Tobacco Use: Medium Risk (05/22/2022)   SDOH Interventions:     Readmission Risk Interventions    04/25/2021    1:30 PM 04/24/2021    4:26 PM 04/17/2021    3:57 PM  Readmission Risk Prevention Plan  Transportation Screening Complete Complete Complete  Medication Review [employer])   Complete  PCP or Specialist appointment within 3-5 days of discharge   Complete  HRI or Home Care Consult Complete Complete Complete  SW Recovery Care/Counseling Consult Complete Complete Complete  Palliative Care Screening Complete  Not Applicable  Skilled Nursing Facility Complete Complete Complete

## 2022-05-22 NOTE — Hospital Course (Signed)
89 year old gentleman who is DNR with significant ischemic cardiomyopathy EF about 35%, chronic biventricular heart failure, hypertension, coronary artery disease, carotid artery disease, BPH, [employer] ICD in place, hyperlipidemia, myelodysplasia, prostate cancer, left adrenal cancer, B12 deficiency, who is a former smoker who quit 30 years ago was sent from [place] where he is a long-term resident for shortness of breath symptoms.  The patient is vaccinated and boosted for COVID-19.  He was noted to be hypoxic on room air 85% and having a fever of 101.4.  EMS was concerned that he had pneumonia due to Rales on exam.  He was given IM Rocephin 1 g x 1 by EMS.  In the emergency department he was placed on supplemental oxygen with improvement in pulse ox.  His SARS 2 coronavirus testing was positive.  He was negative for RSV and influenza.  He had a normal lactic acid.  His white blood cell count was normal.  His chest x-ray showed findings of infiltrates.  His blood glucose was elevated.  He was given IV steroids and admission requested for further management of COVID associated pneumonia.

## 2022-05-23 DIAGNOSIS — I10 Essential (primary) hypertension: Secondary | ICD-10-CM | POA: Diagnosis not present

## 2022-05-23 DIAGNOSIS — U071 COVID-19: Secondary | ICD-10-CM | POA: Diagnosis not present

## 2022-05-23 DIAGNOSIS — J9601 Acute respiratory failure with hypoxia: Secondary | ICD-10-CM

## 2022-05-23 DIAGNOSIS — I5022 Chronic systolic (congestive) heart failure: Secondary | ICD-10-CM

## 2022-05-23 LAB — COMPREHENSIVE METABOLIC PANEL
ALT: 50 U/L — ABNORMAL HIGH (ref 0–44)
AST: 35 U/L (ref 15–41)
Albumin: 2.8 g/dL — ABNORMAL LOW (ref 3.5–5.0)
Alkaline Phosphatase: 61 U/L (ref 38–126)
Anion gap: 13 (ref 5–15)
BUN: 53 mg/dL — ABNORMAL HIGH (ref 8–23)
CO2: 23 mmol/L (ref 22–32)
Calcium: 8.3 mg/dL — ABNORMAL LOW (ref 8.9–10.3)
Chloride: 105 mmol/L (ref 98–111)
Creatinine, Ser: 1.72 mg/dL — ABNORMAL HIGH (ref 0.61–1.24)
GFR, Estimated: 37 mL/min — ABNORMAL LOW (ref >60)
Glucose, Bld: 151 mg/dL — ABNORMAL HIGH (ref 70–99)
Potassium: 4.2 mmol/L (ref 3.5–5.1)
Sodium: 141 mmol/L (ref 135–145)
Total Bilirubin: 0.8 mg/dL (ref 0.3–1.2)
Total Protein: 5.7 g/dL — ABNORMAL LOW (ref 6.5–8.1)

## 2022-05-23 LAB — URINE CULTURE: Culture: NO GROWTH

## 2022-05-23 LAB — CBC WITH DIFFERENTIAL/PLATELET
Abs Immature Granulocytes: 0.06 10*3/uL (ref 0.00–0.07)
Basophils Absolute: 0 10*3/uL (ref 0.0–0.1)
Basophils Relative: 0 %
Eosinophils Absolute: 0 10*3/uL (ref 0.0–0.5)
Eosinophils Relative: 0 %
HCT: 32.6 % — ABNORMAL LOW (ref 39.0–52.0)
Hemoglobin: 10.5 g/dL — ABNORMAL LOW (ref 13.0–17.0)
Immature Granulocytes: 1 %
Lymphocytes Relative: 15 %
Lymphs Abs: 0.8 10*3/uL (ref 0.7–4.0)
MCH: 31.6 pg (ref 26.0–34.0)
MCHC: 32.2 g/dL (ref 30.0–36.0)
MCV: 98.2 fL (ref 80.0–100.0)
Monocytes Absolute: 2.1 10*3/uL — ABNORMAL HIGH (ref 0.1–1.0)
Monocytes Relative: 42 %
Neutro Abs: 2.1 10*3/uL (ref 1.7–7.7)
Neutrophils Relative %: 42 %
Platelets: 78 10*3/uL — ABNORMAL LOW (ref 150–400)
RBC: 3.32 MIL/uL — ABNORMAL LOW (ref 4.22–5.81)
RDW: 14.6 % (ref 11.5–15.5)
WBC: 5.1 10*3/uL (ref 4.0–10.5)
nRBC: 0 % (ref 0.0–0.2)

## 2022-05-23 LAB — PHOSPHORUS: Phosphorus: 5.3 mg/dL — ABNORMAL HIGH (ref 2.5–4.6)

## 2022-05-23 LAB — BRAIN NATRIURETIC PEPTIDE: B Natriuretic Peptide: 1906 pg/mL — ABNORMAL HIGH (ref 0.0–100.0)

## 2022-05-23 LAB — C-REACTIVE PROTEIN: CRP: 13.4 mg/dL — ABNORMAL HIGH (ref <1.0)

## 2022-05-23 LAB — GLUCOSE, CAPILLARY
Glucose-Capillary: 152 mg/dL — ABNORMAL HIGH (ref 70–99)
Glucose-Capillary: 138 mg/dL — ABNORMAL HIGH (ref 70–99)
Glucose-Capillary: 174 mg/dL — ABNORMAL HIGH (ref 70–99)
Glucose-Capillary: 128 mg/dL — ABNORMAL HIGH (ref 70–99)
Glucose-Capillary: 183 mg/dL — ABNORMAL HIGH (ref 70–99)

## 2022-05-23 LAB — D-DIMER, QUANTITATIVE: D-Dimer, Quant: 2.77 ug/mL-FEU — ABNORMAL HIGH (ref 0.00–0.50)

## 2022-05-23 LAB — FERRITIN: Ferritin: 89 ng/mL (ref 24–336)

## 2022-05-23 LAB — MAGNESIUM: Magnesium: 2.1 mg/dL (ref 1.7–2.4)

## 2022-05-23 MED ORDER — METHYLPREDNISOLONE SODIUM SUCC 125 MG IJ SOLR
120.0000 mg | INTRAMUSCULAR | Status: AC
  Administered 2022-05-23 – 2022-05-25 (×3): 120 mg via INTRAVENOUS
  Filled 2022-05-23 (×3): qty 2

## 2022-05-23 MED ORDER — IPRATROPIUM-ALBUTEROL 20-100 MCG/ACT IN AERS
1.0000 | INHALATION_SPRAY | Freq: Two times a day (BID) | RESPIRATORY_TRACT | Status: DC
  Administered 2022-05-24 – 2022-05-25 (×3): 1 via RESPIRATORY_TRACT

## 2022-05-23 NOTE — Progress Notes (Signed)
Patient slept on and off this shift waking to take medication and also when patient forgets he had the malewick on to urinate. No complaint of pain. Patient ambulated and stood on scale for weight this morning with assistance.

## 2022-05-23 NOTE — Progress Notes (Signed)
PROGRESS NOTE  [name] IPJ:[id number] DOB: Aug 17, 1926 DOA: 05/22/2022 PCP: [name], MD  Brief History:  89 year old gentleman who is DNR with HFrEF (EF 40-45%), chronic biventricular heart failure, hypertension, coronary artery disease, pAfib,  BPH, CHB s/pMedtronic ICD in place, hyperlipidemia, myelodysplasia, prostate cancer, left adrenal cancer, B12 deficiency, who is a former smoker who quit 30 years ago was sent from [place] where he is a long-term resident for shortness of breath.  He also has a nonproductive cough.  He denies n/v/d, abd pain, cp.  The patient is vaccinated and boosted for COVID-19.  He was noted to be hypoxic on room air 85% and having a fever of 101.4.  EMS was concerned that he had pneumonia due to Rales on exam.  He was given IM Rocephin 1 g x 1 by EMS.  In the emergency department he was placed on supplemental oxygen with improvement in pulse ox.  His SARS 2 coronavirus testing was positive.  He was negative for RSV and influenza.  He had a normal lactic acid.  His white blood cell count was normal.  His chest x-ray showed findings of infiltrates.  His blood glucose was elevated.  He was given IV steroids and admission requested for further management of COVID associated pneumonia.   Assessment/Plan: Acute respiratory failure with hypoxia Secondary to COVID-19 pneumonia -Presented with oxygen saturation 85% on room air and tachypnea -Stable on 2 L -Continue IV remdesivir -Continue Solu-Medrol -CRP 13.4>> -Ferritin 89>> -D-dimer 2.77 -CTA chest--negative PE, chronic small bilateral pleural effusion, left adrenal mass, old left rib fractures, subtle right lung pleural pleural-parenchymal density -Continue Combivent  Chronic HFrEF -12/17/20 EF 40-45%, mild MR,+WMA -He does have some signs of fluid overload -Check BNP -continue Entresto  Uncontrolled diabetes mellitus type 2 with hyperglycemia -Continue linagliptin  -NovoLog sliding  scale -Continue NovoLog 3 units with meals -Continue Semglee 10 units daily  Prostate cancer/left adrenal mass -Left adrenal mass felt to be secondary to metastasis versus carcinoma -Follow-up Dr. Delton Coombes -Status post XRT 01/08/2022-01/23/2019  Transaminasemia -Secondary to COVID-19 infection -Continue to trend  Essential HTN -continue carvedilol  Mixed hyperlipidemia -Holding statin secondary to elevated LFTs  CKD stage IIIa -Baseline creatinine 1.2-1.5 -Monitor with Lasix  Myelodysplastic syndrome -Follow-up Dr. Delton Coombes  Paroxysmal atrial fibrillation -Currently in sinus rhythm -Not on anticoagulation long-term secondary to frailty and poor baseline function  Tobacco abuse in remission -50 pk yr -quit 20 years ago    Family Communication: no  Family at bedside  Consultants:  none  Code Status:  DNR  DVT Prophylaxis: [place] Lovenox   Procedures: As Listed in Progress Note Above  Antibiotics: None      Subjective: Patient denies fevers, chills, headache, chest pain, dyspnea, nausea, vomiting, diarrhea, abdominal pain, dysuria, hematuria, hematochezia, and melena.   Objective: Vitals:   05/23/22 0324 05/23/22 0330 05/23/22 0500 05/23/22 0826  BP: 127/63     Pulse: 71     Resp: 20     Temp: 98.4 F (36.9 C)     TempSrc:      SpO2: 100% 100%  100%  Weight:   68.6 kg   Height:        Intake/Output Summary (Last 24 hours) at 05/23/2022 1111 Last data filed at 05/23/2022 0900 Gross per 24 hour  Intake 1532.54 ml  Output 800 ml  Net 732.54 ml   Weight change: -6.4 kg Exam:  General:  Pt is alert,  follows commands appropriately, not in acute distress HEENT: No icterus, No thrush, No neck mass, [place]/AT Cardiovascular: RRR, S1/S2, no rubs, no gallops Respiratory: bilateral rales.  Bibasilar wheeze Abdomen: Soft/+BS, non tender, non distended, no guarding Extremities: 1 + LE edema, No lymphangitis, No petechiae, No rashes, no  synovitis   Data Reviewed: I have personally reviewed following labs and imaging studies Basic Metabolic Panel: Recent Labs  Lab 05/22/22 0628 05/23/22 0403  NA 140 141  K 4.3 4.2  CL 103 105  CO2 26 23  GLUCOSE 238* 151*  BUN 44* 53*  CREATININE 1.84* 1.72*  CALCIUM 9.1 8.3*  MG  --  2.1  PHOS  --  5.3*   Liver Function Tests: Recent Labs  Lab 05/22/22 0628 05/23/22 0403  AST 66* 35  ALT 71* 50*  ALKPHOS 83 61  BILITOT 1.4* 0.8  PROT 6.8 5.7*  ALBUMIN 3.6 2.8*   No results for input(s): "LIPASE", "AMYLASE" in the last 168 hours. No results for input(s): "AMMONIA" in the last 168 hours. Coagulation Profile: Recent Labs  Lab 05/22/22 0628  INR 1.2   CBC: Recent Labs  Lab 05/22/22 0628 05/23/22 0403  WBC 6.6 5.1  NEUTROABS 1.8 2.1  HGB 11.4* 10.5*  HCT 35.4* 32.6*  MCV 98.6 98.2  PLT 105* 78*   Cardiac Enzymes: No results for input(s): "CKTOTAL", "CKMB", "CKMBINDEX", "TROPONINI" in the last 168 hours. BNP: Invalid input(s): "POCBNP" CBG: Recent Labs  Lab 05/22/22 1200 05/22/22 1736 05/22/22 2132 05/23/22 0321 05/23/22 0734  GLUCAP 183* 137* 144* 152* 138*   HbA1C: Recent Labs    05/22/22 0628  HGBA1C 8.7*   Urine analysis:    Component Value Date/Time   COLORURINE AMBER (A) 05/22/2022 0628   APPEARANCEUR HAZY (A) 05/22/2022 0628   LABSPEC 1.020 05/22/2022 0628   PHURINE 5.0 05/22/2022 0628   GLUCOSEU NEGATIVE 05/22/2022 0628   HGBUR MODERATE (A) 05/22/2022 0628   BILIRUBINUR NEGATIVE 05/22/2022 0628   KETONESUR NEGATIVE 05/22/2022 0628   PROTEINUR >=300 (A) 05/22/2022 0628   NITRITE NEGATIVE 05/22/2022 0628   LEUKOCYTESUR NEGATIVE 05/22/2022 0628   Sepsis Labs: '@LABRCNTIP'$ (procalcitonin:4,lacticidven:4) ) Recent Results (from the past 240 hour(s))  Blood Culture (routine x 2)     Status: None (Preliminary result)   Collection Time: 05/22/22  6:28 AM   Specimen: BLOOD RIGHT FOREARM  Result Value Ref Range Status   Specimen  Description BLOOD RIGHT FOREARM  Final   Special Requests   Final    BOTTLES DRAWN AEROBIC AND ANAEROBIC Blood Culture results may not be optimal due to an excessive volume of blood received in culture bottles   Culture   Final    NO GROWTH < 24 HOURS Performed at [facility], [address] [place], [place] [address]    Report Status PENDING  Incomplete  Urine Culture     Status: None   Collection Time: 05/22/22  6:28 AM   Specimen: In/Out Cath Urine  Result Value Ref Range Status   Specimen Description   Final    IN/OUT CATH URINE Performed at [facility], [address], [place], [place] [address]    Special Requests   Final    NONE Performed at [facility], [address], [place], [place] [address]    Culture   Final    NO GROWTH Performed at [facility], [place] [address], [place], [place] [address]    Report Status 05/23/2022 FINAL  Final  Blood Culture (routine x 2)  Status: None (Preliminary result)   Collection Time: 05/22/22  6:33 AM   Specimen: BLOOD LEFT WRIST  Result Value Ref Range Status   Specimen Description BLOOD LEFT WRIST  Final   Special Requests   Final    BOTTLES DRAWN AEROBIC AND ANAEROBIC Blood Culture adequate volume   Culture   Final    NO GROWTH < 24 HOURS Performed at [facility], [address], [place], [place] [address]    Report Status PENDING  Incomplete  Resp panel by RT-PCR (RSV, Flu A&B, Covid) Anterior Nasal Swab     Status: Abnormal   Collection Time: 05/22/22  6:49 AM   Specimen: Anterior Nasal Swab  Result Value Ref Range Status   SARS Coronavirus 2 by RT PCR POSITIVE (A) NEGATIVE Final    Comment: (NOTE) SARS-CoV-2 target nucleic acids are DETECTED.  The SARS-CoV-2 RNA is generally detectable in upper respiratory specimens during the acute phase of infection. Positive results are indicative of the presence of the identified virus, but do not rule out bacterial infection or co-infection with other pathogens  not detected by the test. Clinical correlation with patient history and other diagnostic information is necessary to determine patient infection status. The expected result is Negative.  Fact Sheet for Patients: EntrepreneurPulse.com.au  Fact Sheet for Healthcare Providers: IncredibleEmployment.be  This test is not yet approved or cleared by the [place] FDA and  has been authorized for detection and/or diagnosis of SARS-CoV-2 by FDA under an Emergency Use Authorization (EUA).  This EUA will remain in effect (meaning this test can be used) for the duration of  the COVID-19 declaration under Section 564(b)(1) of the A ct, 21 U.S.C. section 360bbb-3(b)(1), unless the authorization is terminated or revoked sooner.     Influenza A by PCR NEGATIVE NEGATIVE Final   Influenza B by PCR NEGATIVE NEGATIVE Final    Comment: (NOTE) The Xpert Xpress SARS-CoV-2/FLU/RSV plus assay is intended as an aid in the diagnosis of influenza from Nasopharyngeal swab specimens and should not be used as a sole basis for treatment. Nasal washings and aspirates are unacceptable for Xpert Xpress SARS-CoV-2/FLU/RSV testing.  Fact Sheet for Patients: EntrepreneurPulse.com.au  Fact Sheet for Healthcare Providers: IncredibleEmployment.be  This test is not yet approved or cleared by the [place] FDA and has been authorized for detection and/or diagnosis of SARS-CoV-2 by FDA under an Emergency Use Authorization (EUA). This EUA will remain in effect (meaning this test can be used) for the duration of the COVID-19 declaration under Section 564(b)(1) of the Act, 21 U.S.C. section 360bbb-3(b)(1), unless the authorization is terminated or revoked.     Resp Syncytial Virus by PCR NEGATIVE NEGATIVE Final    Comment: (NOTE) Fact Sheet for Patients: EntrepreneurPulse.com.au  Fact Sheet for Healthcare  Providers: IncredibleEmployment.be  This test is not yet approved or cleared by the [place] FDA and has been authorized for detection and/or diagnosis of SARS-CoV-2 by FDA under an Emergency Use Authorization (EUA). This EUA will remain in effect (meaning this test can be used) for the duration of the COVID-19 declaration under Section 564(b)(1) of the Act, 21 U.S.C. section 360bbb-3(b)(1), unless the authorization is terminated or revoked.  Performed at [facility], [address], [place], [place] [address]      Scheduled Meds:  vitamin C  500 mg Oral Daily   aspirin EC  81 mg Oral Daily   carvedilol  12.5 mg Oral BID WC   enoxaparin (LOVENOX) injection  30 mg Subcutaneous Daily  furosemide  20 mg Oral Daily   insulin aspart  0-9 Units Subcutaneous TID WC   insulin aspart  3 Units Subcutaneous TID WC   insulin glargine-yfgn  10 Units Subcutaneous Daily   Ipratropium-Albuterol  1 puff Inhalation Q6H   linagliptin  5 mg Oral Daily   methylPREDNISolone (SOLU-MEDROL) injection  120 mg Intravenous Q24H   nystatin   Topical BID   potassium chloride  10 mEq Oral Daily   sacubitril-valsartan  1 tablet Oral BID   zinc sulfate  220 mg Oral Daily   Continuous Infusions:  remdesivir 100 mg in sodium chloride 0.9 % 100 mL IVPB      Procedures/Studies: CT Angio Chest PE W and/or Wo Contrast  Result Date: 05/22/2022 CLINICAL DATA:  89 year old with upper back pain. Pulmonary embolism suspected, high probability. EXAM: CT ANGIOGRAPHY CHEST WITH CONTRAST TECHNIQUE: Multidetector CT imaging of the chest was performed using the standard protocol during bolus administration of intravenous contrast. Multiplanar CT image reconstructions and MIPs were obtained to evaluate the vascular anatomy. RADIATION DOSE REDUCTION: This exam was performed according to the departmental dose-optimization program which includes automated exposure control, adjustment of the mA and/or kV  according to patient size and/or use of iterative reconstruction technique. CONTRAST:  93m OMNIPAQUE IOHEXOL 350 MG/ML SOLN COMPARISON:  PET-CT 11/30/2021 FINDINGS: Cardiovascular: Negative for pulmonary embolism. Post CABG changes with a left chest ICD. Coronary arteries are heavily calcified. Heart size is within normal limits and stable. Mediastinum/Nodes: Small mediastinal lymph nodes. Overall, no significant lymph node enlargement in the mediastinum, hila or axillary regions. Lungs/Pleura: Small bilateral pleural effusions, right side greater than left. Right pleural effusion is minimally changed in size. Left pleural effusion is minimally enlarged. Motion artifact limits evaluation of the lungs. Subtle parenchymal densities in the right lung. 4 mm nodular density left upper lobe on image 50/6 is probably minimally changed but this area is difficult to evaluate due to significant motion artifact. Upper Abdomen: Again noted is a large left adrenal mass that measures approximately 7.9 x 7.9 cm and minimally changed from the previous PET-CT. This is suggestive for primary adrenal neoplasm. Right adrenal gland is within normal limits. Calcified gallstones. Musculoskeletal: Old right rib fractures. Old left posterior eleventh rib fracture. Mild deformity and sclerosis involving the T2 vertebral body appears to be similar from the previous PET-CT. No evidence for an acute vertebral body compression fracture. Review of the MIP images confirms the above findings. IMPRESSION: 1. Negative for pulmonary embolism. 2. Subtle parenchymal densities in the right lung. Limited evaluation of the lungs due to motion artifact but cannot exclude an infectious etiology. 3. Chronic small bilateral pleural effusions that have minimally changed since 11/30/2021. 4. No significant change in the large left adrenal mass. 5. Old bilateral rib fractures.  No acute bone abnormality. 6. Cholelithiasis. Electronically Signed   By: AMarkus Daft M.D.   On: 05/22/2022 08:52   DG Chest Port 1 View  Result Date: 05/22/2022 CLINICAL DATA:  89year old male with possible sepsis. Back pain. Low oxygen saturations. EXAM: PORTABLE CHEST 1 VIEW COMPARISON:  Chest x-ray 04/24/2021. FINDINGS: Widespread areas of interstitial prominence, diffuse peribronchial cuffing and patchy ill-defined opacities are noted in the lungs bilaterally (right greater than left), concerning for bronchitis with likely developing multilobar bilateral bronchopneumonia. No confluent consolidative airspace disease. No pleural effusions. No pneumothorax. No evidence of pulmonary edema. Heart size is mildly enlarged with prominence of the left ventricular contour, suggesting left ventricular hypertrophy. Upper mediastinal contours are  within normal limits. Atherosclerotic calcifications are noted in the thoracic aorta. Status post median sternotomy. Left-sided pacemaker/AICD with lead tips projecting over the expected location of the right atrium and right ventricle. Old healed right-sided rib fractures are incidentally noted. IMPRESSION: 1. The appearance the chest is concerning for bronchitis with developing multilobar bilateral bronchopneumonia. Followup PA and lateral chest X-ray is recommended in 3-4 weeks following trial of antibiotic therapy to ensure resolution and exclude underlying malignancy. 2. Cardiomegaly with probable left ventricular hypertrophy. 3. Aortic atherosclerosis. Electronically Signed   By: Vinnie Langton M.D.   On: 05/22/2022 07:32   CUP PACEART REMOTE DEVICE CHECK  Result Date: 04/26/2022 Scheduled remote reviewed. Normal device function.  Next remote 91 days. Kathy Breach, RN, CCDS, [employer]   Orson Eva, DO  [facility]  If 7PM-7AM, please contact night-coverage [email or web address] Password [email or web address] 05/23/2022, 11:11 AM   LOS: 1 day

## 2022-05-23 NOTE — Plan of Care (Signed)
  Problem: Education: Goal: Ability to describe self-care measures that may prevent or decrease complications (Diabetes Survival Skills Education) will improve Outcome: Progressing   

## 2022-05-24 DIAGNOSIS — U071 COVID-19: Secondary | ICD-10-CM | POA: Diagnosis not present

## 2022-05-24 DIAGNOSIS — J9601 Acute respiratory failure with hypoxia: Secondary | ICD-10-CM | POA: Diagnosis not present

## 2022-05-24 DIAGNOSIS — Z95810 Presence of automatic (implantable) cardiac defibrillator: Secondary | ICD-10-CM | POA: Diagnosis not present

## 2022-05-24 DIAGNOSIS — E278 Other specified disorders of adrenal gland: Secondary | ICD-10-CM | POA: Diagnosis not present

## 2022-05-24 LAB — GLUCOSE, CAPILLARY
Glucose-Capillary: 169 mg/dL — ABNORMAL HIGH (ref 70–99)
Glucose-Capillary: 155 mg/dL — ABNORMAL HIGH (ref 70–99)
Glucose-Capillary: 188 mg/dL — ABNORMAL HIGH (ref 70–99)
Glucose-Capillary: 108 mg/dL — ABNORMAL HIGH (ref 70–99)
Glucose-Capillary: 149 mg/dL — ABNORMAL HIGH (ref 70–99)

## 2022-05-24 LAB — CBC WITH DIFFERENTIAL/PLATELET
Abs Immature Granulocytes: 0.07 10*3/uL (ref 0.00–0.07)
Basophils Absolute: 0 10*3/uL (ref 0.0–0.1)
Basophils Relative: 0 %
Eosinophils Absolute: 0 10*3/uL (ref 0.0–0.5)
Eosinophils Relative: 0 %
HCT: 34.6 % — ABNORMAL LOW (ref 39.0–52.0)
Hemoglobin: 11.1 g/dL — ABNORMAL LOW (ref 13.0–17.0)
Immature Granulocytes: 1 %
Lymphocytes Relative: 10 %
Lymphs Abs: 0.5 10*3/uL — ABNORMAL LOW (ref 0.7–4.0)
MCH: 31.4 pg (ref 26.0–34.0)
MCHC: 32.1 g/dL (ref 30.0–36.0)
MCV: 98 fL (ref 80.0–100.0)
Monocytes Absolute: 1.3 10*3/uL — ABNORMAL HIGH (ref 0.1–1.0)
Monocytes Relative: 25 %
Neutro Abs: 3.3 10*3/uL (ref 1.7–7.7)
Neutrophils Relative %: 64 %
Platelets: 70 10*3/uL — ABNORMAL LOW (ref 150–400)
RBC: 3.53 MIL/uL — ABNORMAL LOW (ref 4.22–5.81)
RDW: 14.6 % (ref 11.5–15.5)
WBC: 5.2 10*3/uL (ref 4.0–10.5)
nRBC: 0 % (ref 0.0–0.2)

## 2022-05-24 LAB — COMPREHENSIVE METABOLIC PANEL
ALT: 35 U/L (ref 0–44)
AST: 22 U/L (ref 15–41)
Albumin: 2.8 g/dL — ABNORMAL LOW (ref 3.5–5.0)
Alkaline Phosphatase: 46 U/L (ref 38–126)
Anion gap: 11 (ref 5–15)
BUN: 72 mg/dL — ABNORMAL HIGH (ref 8–23)
CO2: 23 mmol/L (ref 22–32)
Calcium: 8.1 mg/dL — ABNORMAL LOW (ref 8.9–10.3)
Chloride: 105 mmol/L (ref 98–111)
Creatinine, Ser: 1.89 mg/dL — ABNORMAL HIGH (ref 0.61–1.24)
GFR, Estimated: 33 mL/min — ABNORMAL LOW (ref >60)
Glucose, Bld: 161 mg/dL — ABNORMAL HIGH (ref 70–99)
Potassium: 4.9 mmol/L (ref 3.5–5.1)
Sodium: 139 mmol/L (ref 135–145)
Total Bilirubin: 0.9 mg/dL (ref 0.3–1.2)
Total Protein: 5.6 g/dL — ABNORMAL LOW (ref 6.5–8.1)

## 2022-05-24 LAB — D-DIMER, QUANTITATIVE: D-Dimer, Quant: 11.86 ug/mL-FEU — ABNORMAL HIGH (ref 0.00–0.50)

## 2022-05-24 LAB — PHOSPHORUS: Phosphorus: 6 mg/dL — ABNORMAL HIGH (ref 2.5–4.6)

## 2022-05-24 LAB — FERRITIN: Ferritin: 108 ng/mL (ref 24–336)

## 2022-05-24 LAB — MAGNESIUM: Magnesium: 2.4 mg/dL (ref 1.7–2.4)

## 2022-05-24 LAB — C-REACTIVE PROTEIN: CRP: 9.9 mg/dL — ABNORMAL HIGH (ref <1.0)

## 2022-05-24 MED ORDER — ORAL CARE MOUTH RINSE: 15.0000 mL | OROMUCOSAL | Status: DC | PRN

## 2022-05-24 MED ORDER — SODIUM CHLORIDE 0.9 % IV SOLN: INTRAVENOUS | Status: DC

## 2022-05-24 NOTE — Progress Notes (Signed)
   05/24/22 1100  ReDS Vest / Clip  Station Marker C  Ruler Value 34  ReDS Value Range < 36  ReDS Actual Value 29

## 2022-05-24 NOTE — Progress Notes (Signed)
Entresto held  per Dr. Josephine Cables due to BP of 102/50 and pulse 63. No complaints of pain patient slept through the night. Standing weight 69.5 kg. Patient ambulated with assist to bathroom. He returned to bed. Continued to monitor patient.

## 2022-05-24 NOTE — Progress Notes (Signed)
Luke NOTE  [name] TFT:[id number] DOB: 07-10-1926 DOA: 05/22/2022 PCP: [name], MD  Brief History:  89 year old gentleman who is DNR with CKD3, HFrEF (EF 40-45%), chronic biventricular heart failure, hypertension, coronary artery disease, pAfib,  BPH, CHB s/pMedtronic ICD in place, hyperlipidemia, myelodysplasia, prostate cancer, left adrenal mass, B12 deficiency, who is a former smoker who quit 30 years ago was sent from [place] where he is a long-term resident for shortness of breath.  He also has a nonproductive cough.  He denies n/v/d, abd pain, cp.  The patient is vaccinated and boosted for COVID-19.  He was noted to be hypoxic on room air 85% and having a fever of 101.4.  EMS was concerned that he had pneumonia due to Rales on exam.  He was given IM Rocephin 1 g x 1 by EMS.  In the emergency department he was placed on supplemental oxygen with improvement in pulse ox.  His SARS 2 coronavirus testing was positive.  He was negative for RSV and influenza.  He had a normal lactic acid.  His white blood cell count was normal.  His chest x-ray showed findings of infiltrates.  His blood glucose was elevated.  He was given IV steroids and admission requested for further management of COVID associated pneumonia.   Assessment/Plan: Acute respiratory failure with hypoxia Secondary to COVID-19 pneumonia -Presented with oxygen saturation 85% on room air and tachypnea -Stable on 2 L>>weaned to RA -Continue IV remdesivir--received 3 days -Continue Solu-Medrol -CRP 13.4>>9.9 -Ferritin 89>>108 -D-dimer 2.77>>11.86 -CTA chest--negative PE, chronic small bilateral pleural effusion, left adrenal mass, old left rib fractures, subtle right lung pleural pleural-parenchymal density -Continue Combivent   Chronic HFrEF -12/17/20 EF 40-45%, mild MR,+WMA -He does have some signs of fluid overload -clinically euvolemic -hold Entresto due to rising serum creatinine--discussed with Dr.  Domenic Polite -05/24/22 ReDS vest reading 29   Uncontrolled diabetes mellitus type 2 with hyperglycemia -Continue linagliptin  -NovoLog sliding scale -Continue NovoLog 3 units with meals -Continue Semglee 10 units daily   Prostate cancer/left adrenal mass -Left adrenal mass felt to be secondary to metastasis versus carcinoma -Follow-up Dr. Delton Coombes -Status post XRT 01/08/2022-01/23/2019   Transaminasemia -Secondary to COVID-19 infection -Continue to trend   Essential HTN -continue carvedilol   Mixed hyperlipidemia -Holding statin secondary to elevated LFTs   Acute on chronic renal failure CKD stage IIIa -Baseline creatinine 1.2-1.4 -serum creatinine up to 1.89 -hold Entresto--discussed with pt's cardiologist, Dr. Domenic Polite   Myelodysplastic syndrome -Follow-up Dr. Delton Coombes   Paroxysmal atrial fibrillation -Currently in sinus rhythm -Not on anticoagulation long-term secondary to frailty and poor baseline function   Tobacco abuse in remission -50 pk yr -quit 20 years ago      Family Communication: no  Family at bedside  Consultants:  [name]  Code Status:  DNR  DVT Prophylaxis:   [place] Lovenox   Procedures: As Listed in Luke Note Above  Antibiotics: [name]         Subjective: Pt states sob is improving but has sob with exertion.  Denies cp, f/c, n/v/d, abd pain  Objective: Vitals:   05/23/22 2016 05/23/22 2116 05/24/22 0340 05/24/22 0849  BP:  (!) 102/50 122/76   Pulse:  63 63 61  Resp:  '18 18 16  '$ Temp:  98.4 F (36.9 C) 98.2 F (36.8 C)   TempSrc:      SpO2: 96% 97% 99% 97%  Weight:   69.5 kg  Height:   '5\' 6"'$  (1.676 m)     Intake/Output Summary (Last 24 hours) at 05/24/2022 1135 Last data filed at 05/24/2022 0500 Gross per 24 hour  Intake 1420 ml  Output 200 ml  Net 1220 ml   Weight change: 0.9 kg Exam:  General:  Pt is alert, follows commands appropriately, not in acute distress HEENT: No icterus, No thrush, No neck mass,  [place]/AT Cardiovascular: RRR, S1/S2, no rubs, no gallops Respiratory: scattered bilateral rales.  Bibasilar wheeze Abdomen: Soft/+BS, non tender, non distended, no guarding Extremities: No edema, No lymphangitis, No petechiae, No rashes, no synovitis   Data Reviewed: I have personally reviewed following labs and imaging studies Basic Metabolic Panel: Recent Labs  Lab 05/22/22 0628 05/23/22 0403 05/24/22 0507  NA 140 141 139  K 4.3 4.2 4.9  CL 103 105 105  CO2 '26 23 23  '$ GLUCOSE 238* 151* 161*  BUN 44* 53* 72*  CREATININE 1.84* 1.72* 1.89*  CALCIUM 9.1 8.3* 8.1*  MG  --  2.1 2.4  PHOS  --  5.3* 6.0*   Liver Function Tests: Recent Labs  Lab 05/22/22 0628 05/23/22 0403 05/24/22 0507  AST 66* 35 22  ALT 71* 50* 35  ALKPHOS 83 61 46  BILITOT 1.4* 0.8 0.9  PROT 6.8 5.7* 5.6*  ALBUMIN 3.6 2.8* 2.8*   No results for input(s): "LIPASE", "AMYLASE" in the last 168 hours. No results for input(s): "AMMONIA" in the last 168 hours. Coagulation Profile: Recent Labs  Lab 05/22/22 0628  INR 1.2   CBC: Recent Labs  Lab 05/22/22 0628 05/23/22 0403 05/24/22 0507  WBC 6.6 5.1 5.2  NEUTROABS 1.8 2.1 3.3  HGB 11.4* 10.5* 11.1*  HCT 35.4* 32.6* 34.6*  MCV 98.6 98.2 98.0  PLT 105* 78* 70*   Cardiac Enzymes: No results for input(s): "CKTOTAL", "CKMB", "CKMBINDEX", "TROPONINI" in the last 168 hours. BNP: Invalid input(s): "POCBNP" CBG: Recent Labs  Lab 05/23/22 1606 05/23/22 2115 05/24/22 0341 05/24/22 0741 05/24/22 1107  GLUCAP 128* 183* 169* 155* 188*   HbA1C: Recent Labs    05/22/22 0628  HGBA1C 8.7*   Urine analysis:    Component Value Date/Time   COLORURINE AMBER (A) 05/22/2022 0628   APPEARANCEUR HAZY (A) 05/22/2022 0628   LABSPEC 1.020 05/22/2022 0628   PHURINE 5.0 05/22/2022 0628   GLUCOSEU NEGATIVE 05/22/2022 0628   HGBUR MODERATE (A) 05/22/2022 0628   BILIRUBINUR NEGATIVE 05/22/2022 0628   KETONESUR NEGATIVE 05/22/2022 0628   PROTEINUR >=300 (A)  05/22/2022 0628   NITRITE NEGATIVE 05/22/2022 0628   LEUKOCYTESUR NEGATIVE 05/22/2022 0628   Sepsis Labs: '@LABRCNTIP'$ (procalcitonin:4,lacticidven:4) ) Recent Results (from the past 240 hour(s))  Blood Culture (routine x 2)     Status: [name] (Preliminary result)   Collection Time: 05/22/22  6:28 AM   Specimen: BLOOD RIGHT FOREARM  Result Value Ref Range Status   Specimen Description BLOOD RIGHT FOREARM  Final   Special Requests   Final    BOTTLES DRAWN AEROBIC AND ANAEROBIC Blood Culture results may not be optimal due to an excessive volume of blood received in culture bottles   Culture   Final    NO GROWTH 2 DAYS Performed at [facility], [address] [place], [place] [address]    Report Status PENDING  Incomplete  Urine Culture     Status: [name]   Collection Time: 05/22/22  6:28 AM   Specimen: In/Out Cath Urine  Result Value Ref Range Status   Specimen Description   Final  IN/OUT CATH URINE Performed at [facility], [address], [place], [place] [address]    Special Requests   Final    [name] Performed at [facility], [address] [place], [place] [address]    Culture   Final    NO GROWTH Performed at [facility], [place] [address] [place], [place] [address]    Report Status 05/23/2022 FINAL  Final  Blood Culture (routine x 2)     Status: [name] (Preliminary result)   Collection Time: 05/22/22  6:33 AM   Specimen: BLOOD LEFT WRIST  Result Value Ref Range Status   Specimen Description BLOOD LEFT WRIST  Final   Special Requests   Final    BOTTLES DRAWN AEROBIC AND ANAEROBIC Blood Culture adequate volume   Culture   Final    NO GROWTH 2 DAYS Performed at [facility], [address] [place], [place] [address]    Report Status PENDING  Incomplete  Resp panel by RT-PCR (RSV, Flu A&B, Covid) Anterior Nasal Swab     Status: Abnormal   Collection Time: 05/22/22  6:49 AM   Specimen: Anterior Nasal Swab  Result Value Ref Range Status   SARS Coronavirus 2 by RT PCR  POSITIVE (A) NEGATIVE Final    Comment: (NOTE) SARS-CoV-2 target nucleic acids are DETECTED.  The SARS-CoV-2 RNA is generally detectable in upper respiratory specimens during the acute phase of infection. Positive results are indicative of the presence of the identified virus, but do not rule out bacterial infection or co-infection with other pathogens not detected by the test. Clinical correlation with patient history and other diagnostic information is necessary to determine patient infection status. The expected result is Negative.  Fact Sheet for Patients: EntrepreneurPulse.com.au  Fact Sheet for Healthcare Providers: IncredibleEmployment.be  This test is not yet approved or cleared by the [place] FDA and  has been authorized for detection and/or diagnosis of SARS-CoV-2 by FDA under an Emergency Use Authorization (EUA).  This EUA will remain in effect (meaning this test can be used) for the duration of  the COVID-19 declaration under Section 564(b)(1) of the A ct, 21 U.S.C. section 360bbb-3(b)(1), unless the authorization is terminated or revoked sooner.     Influenza A by PCR NEGATIVE NEGATIVE Final   Influenza B by PCR NEGATIVE NEGATIVE Final    Comment: (NOTE) The Xpert Xpress SARS-CoV-2/FLU/RSV plus assay is intended as an aid in the diagnosis of influenza from Nasopharyngeal swab specimens and should not be used as a sole basis for treatment. Nasal washings and aspirates are unacceptable for Xpert Xpress SARS-CoV-2/FLU/RSV testing.  Fact Sheet for Patients: EntrepreneurPulse.com.au  Fact Sheet for Healthcare Providers: IncredibleEmployment.be  This test is not yet approved or cleared by the [place] FDA and has been authorized for detection and/or diagnosis of SARS-CoV-2 by FDA under an Emergency Use Authorization (EUA). This EUA will remain in effect (meaning this test can be used)  for the duration of the COVID-19 declaration under Section 564(b)(1) of the Act, 21 U.S.C. section 360bbb-3(b)(1), unless the authorization is terminated or revoked.     Resp Syncytial Virus by PCR NEGATIVE NEGATIVE Final    Comment: (NOTE) Fact Sheet for Patients: EntrepreneurPulse.com.au  Fact Sheet for Healthcare Providers: IncredibleEmployment.be  This test is not yet approved or cleared by the [place] FDA and has been authorized for detection and/or diagnosis of SARS-CoV-2 by FDA under an Emergency Use Authorization (EUA). This EUA will remain in effect (meaning this test can be  used) for the duration of the COVID-19 declaration under Section 564(b)(1) of the Act, 21 U.S.C. section 360bbb-3(b)(1), unless the authorization is terminated or revoked.  Performed at [facility], [address] [place], [place] [address]      Scheduled Meds:  vitamin C  500 mg Oral Daily   aspirin EC  81 mg Oral Daily   carvedilol  12.5 mg Oral BID WC   enoxaparin (LOVENOX) injection  30 mg Subcutaneous Daily   furosemide  20 mg Oral Daily   insulin aspart  0-9 Units Subcutaneous TID WC   insulin aspart  3 Units Subcutaneous TID WC   insulin glargine-yfgn  10 Units Subcutaneous Daily   Ipratropium-Albuterol  1 puff Inhalation BID   linagliptin  5 mg Oral Daily   methylPREDNISolone (SOLU-MEDROL) injection  120 mg Intravenous Q24H   nystatin   Topical BID   zinc sulfate  220 mg Oral Daily   Continuous Infusions:  remdesivir 100 mg in sodium chloride 0.9 % 100 mL IVPB 100 mg (05/24/22 1116)    Procedures/Studies: CT Angio Chest PE W and/or Wo Contrast  Result Date: 05/22/2022 CLINICAL DATA:  89 year old with upper back pain. Pulmonary embolism suspected, high probability. EXAM: CT ANGIOGRAPHY CHEST WITH CONTRAST TECHNIQUE: Multidetector CT imaging of the chest was performed using the standard protocol during bolus administration of intravenous  contrast. Multiplanar CT image reconstructions and MIPs were obtained to evaluate the vascular anatomy. RADIATION DOSE REDUCTION: This exam was performed according to the departmental dose-optimization program which includes automated exposure control, adjustment of the mA and/or kV according to patient size and/or use of iterative reconstruction technique. CONTRAST:  50m OMNIPAQUE IOHEXOL 350 MG/ML SOLN COMPARISON:  PET-CT 11/30/2021 FINDINGS: Cardiovascular: Negative for pulmonary embolism. Post CABG changes with a left chest ICD. Coronary arteries are heavily calcified. Heart size is within normal limits and stable. Mediastinum/Nodes: Small mediastinal lymph nodes. Overall, no significant lymph node enlargement in the mediastinum, hila or axillary regions. Lungs/Pleura: Small bilateral pleural effusions, right side greater than left. Right pleural effusion is minimally changed in size. Left pleural effusion is minimally enlarged. Motion artifact limits evaluation of the lungs. Subtle parenchymal densities in the right lung. 4 mm nodular density left upper lobe on image 50/6 is probably minimally changed but this area is difficult to evaluate due to significant motion artifact. Upper Abdomen: Again noted is a large left adrenal mass that measures approximately 7.9 x 7.9 cm and minimally changed from the previous PET-CT. This is suggestive for primary adrenal neoplasm. Right adrenal gland is within normal limits. Calcified gallstones. Musculoskeletal: Old right rib fractures. Old left posterior eleventh rib fracture. Mild deformity and sclerosis involving the T2 vertebral body appears to be similar from the previous PET-CT. No evidence for an acute vertebral body compression fracture. Review of the MIP images confirms the above findings. IMPRESSION: 1. Negative for pulmonary embolism. 2. Subtle parenchymal densities in the right lung. Limited evaluation of the lungs due to motion artifact but cannot exclude an  infectious etiology. 3. Chronic small bilateral pleural effusions that have minimally changed since 11/30/2021. 4. No significant change in the large left adrenal mass. 5. Old bilateral rib fractures.  No acute bone abnormality. 6. Cholelithiasis. Electronically Signed   By: AMarkus DaftM.D.   On: 05/22/2022 08:52   DG Chest Port 1 View  Result Date: 05/22/2022 CLINICAL DATA:  89year old male with possible sepsis. Back pain. Low oxygen saturations. EXAM: PORTABLE CHEST 1 VIEW COMPARISON:  Chest x-ray 04/24/2021.  FINDINGS: Widespread areas of interstitial prominence, diffuse peribronchial cuffing and patchy ill-defined opacities are noted in the lungs bilaterally (right greater than left), concerning for bronchitis with likely developing multilobar bilateral bronchopneumonia. No confluent consolidative airspace disease. No pleural effusions. No pneumothorax. No evidence of pulmonary edema. Heart size is mildly enlarged with prominence of the left ventricular contour, suggesting left ventricular hypertrophy. Upper mediastinal contours are within normal limits. Atherosclerotic calcifications are noted in the thoracic aorta. Status post median sternotomy. Left-sided pacemaker/AICD with lead tips projecting over the expected location of the right atrium and right ventricle. Old healed right-sided rib fractures are incidentally noted. IMPRESSION: 1. The appearance the chest is concerning for bronchitis with developing multilobar bilateral bronchopneumonia. Followup PA and lateral chest X-ray is recommended in 3-4 weeks following trial of antibiotic therapy to ensure resolution and exclude underlying malignancy. 2. Cardiomegaly with probable left ventricular hypertrophy. 3. Aortic atherosclerosis. Electronically Signed   By: Vinnie Langton M.D.   On: 05/22/2022 07:32   CUP PACEART REMOTE DEVICE CHECK  Result Date: 04/26/2022 Scheduled remote reviewed. Normal device function.  Next remote 91 days. Kathy Breach,  RN, CCDS, [employer]   Orson Eva, DO  [facility]  If 7PM-7AM, please contact night-coverage [email or web address] Password [facility] 05/24/2022, 11:35 AM   LOS: 2 days

## 2022-05-24 NOTE — Care Management Important Message (Signed)
Important Message  Patient Details  Name: [name] MRN: [id number] Date of Birth: 03-08-1927   Medicare Important Message Given:  N/A - LOS <3 / Initial given by admissions     Tommy Medal 05/24/2022, 10:46 AM

## 2022-05-25 DIAGNOSIS — Z95810 Presence of automatic (implantable) cardiac defibrillator: Secondary | ICD-10-CM | POA: Diagnosis not present

## 2022-05-25 DIAGNOSIS — J9601 Acute respiratory failure with hypoxia: Secondary | ICD-10-CM | POA: Diagnosis not present

## 2022-05-25 DIAGNOSIS — I5022 Chronic systolic (congestive) heart failure: Secondary | ICD-10-CM | POA: Diagnosis not present

## 2022-05-25 DIAGNOSIS — U071 COVID-19: Secondary | ICD-10-CM | POA: Diagnosis not present

## 2022-05-25 LAB — COMPREHENSIVE METABOLIC PANEL
ALT: 30 U/L (ref 0–44)
AST: 18 U/L (ref 15–41)
Albumin: 2.7 g/dL — ABNORMAL LOW (ref 3.5–5.0)
Alkaline Phosphatase: 43 U/L (ref 38–126)
Anion gap: 9 (ref 5–15)
BUN: 75 mg/dL — ABNORMAL HIGH (ref 8–23)
CO2: 25 mmol/L (ref 22–32)
Calcium: 7.9 mg/dL — ABNORMAL LOW (ref 8.9–10.3)
Chloride: 103 mmol/L (ref 98–111)
Creatinine, Ser: 1.85 mg/dL — ABNORMAL HIGH (ref 0.61–1.24)
GFR, Estimated: 34 mL/min — ABNORMAL LOW (ref >60)
Glucose, Bld: 132 mg/dL — ABNORMAL HIGH (ref 70–99)
Potassium: 4.8 mmol/L (ref 3.5–5.1)
Sodium: 137 mmol/L (ref 135–145)
Total Bilirubin: 0.5 mg/dL (ref 0.3–1.2)
Total Protein: 5.4 g/dL — ABNORMAL LOW (ref 6.5–8.1)

## 2022-05-25 LAB — CBC WITH DIFFERENTIAL/PLATELET
Abs Immature Granulocytes: 0.07 10*3/uL (ref 0.00–0.07)
Basophils Absolute: 0 10*3/uL (ref 0.0–0.1)
Basophils Relative: 0 %
Eosinophils Absolute: 0 10*3/uL (ref 0.0–0.5)
Eosinophils Relative: 0 %
HCT: 34.4 % — ABNORMAL LOW (ref 39.0–52.0)
Hemoglobin: 11 g/dL — ABNORMAL LOW (ref 13.0–17.0)
Immature Granulocytes: 2 %
Lymphocytes Relative: 11 %
Lymphs Abs: 0.5 10*3/uL — ABNORMAL LOW (ref 0.7–4.0)
MCH: 31.2 pg (ref 26.0–34.0)
MCHC: 32 g/dL (ref 30.0–36.0)
MCV: 97.5 fL (ref 80.0–100.0)
Monocytes Absolute: 0.8 10*3/uL (ref 0.1–1.0)
Monocytes Relative: 18 %
Neutro Abs: 3 10*3/uL (ref 1.7–7.7)
Neutrophils Relative %: 69 %
Platelets: 94 10*3/uL — ABNORMAL LOW (ref 150–400)
RBC: 3.53 MIL/uL — ABNORMAL LOW (ref 4.22–5.81)
RDW: 14.5 % (ref 11.5–15.5)
WBC: 4.3 10*3/uL (ref 4.0–10.5)
nRBC: 0.5 % — ABNORMAL HIGH (ref 0.0–0.2)

## 2022-05-25 LAB — C-REACTIVE PROTEIN: CRP: 5.9 mg/dL — ABNORMAL HIGH (ref <1.0)

## 2022-05-25 LAB — FERRITIN: Ferritin: 99 ng/mL (ref 24–336)

## 2022-05-25 LAB — GLUCOSE, CAPILLARY
Glucose-Capillary: 137 mg/dL — ABNORMAL HIGH (ref 70–99)
Glucose-Capillary: 137 mg/dL — ABNORMAL HIGH (ref 70–99)

## 2022-05-25 LAB — D-DIMER, QUANTITATIVE: D-Dimer, Quant: 2.39 ug/mL-FEU — ABNORMAL HIGH (ref 0.00–0.50)

## 2022-05-25 LAB — MAGNESIUM: Magnesium: 2.4 mg/dL (ref 1.7–2.4)

## 2022-05-25 MED ORDER — PREDNISONE 20 MG PO TABS: 50.0000 mg | ORAL_TABLET | Freq: Every day | ORAL | Status: DC

## 2022-05-25 MED ORDER — IPRATROPIUM-ALBUTEROL 20-100 MCG/ACT IN AERS: 1.0000 | INHALATION_SPRAY | Freq: Two times a day (BID) | RESPIRATORY_TRACT | 0 refills | Status: DC

## 2022-05-25 MED ORDER — PREDNISONE 50 MG PO TABS: 50.0000 mg | ORAL_TABLET | Freq: Every day | ORAL | 0 refills | Status: DC

## 2022-05-25 NOTE — Discharge Summary (Addendum)
Physician Discharge Summary   Patient: [name] MRN: [id number] DOB: 1926/07/28  Admit date:     05/22/2022  Discharge date: 05/25/22  Discharge Physician: Shanon Brow Zackeriah Kissler   PCP: [name], MD   Recommendations at discharge:   Please follow up with primary care provider within 1-2 weeks  Please repeat BMP and CBC in one week Follow up with Dr. Domenic Polite (cardiology) on 07/03/22 at [facility]: 89 year old gentleman who is DNR with CKD3, HFrEF (EF 40-45%), chronic biventricular heart failure, hypertension, coronary artery disease, pAfib,  BPH, CHB s/pMedtronic ICD in place, hyperlipidemia, myelodysplasia, prostate cancer, left adrenal mass, B12 deficiency, who is a former smoker who quit 30 years ago was sent from [place] where he is a long-term resident for shortness of breath.  He also has a nonproductive cough.  He denies n/v/d, abd pain, cp.  The patient is vaccinated and boosted for COVID-19.  He was noted to be hypoxic on room air 85% and having a fever of 101.4.  EMS was concerned that he had pneumonia due to Rales on exam.  He was given IM Rocephin 1 g x 1 by EMS.  In the emergency department he was placed on supplemental oxygen with improvement in pulse ox.  His SARS 2 coronavirus testing was positive.  He was negative for RSV and influenza.  He had a normal lactic acid.  His white blood cell count was normal.  His chest x-ray showed findings of infiltrates.  His blood glucose was elevated.  He was given IV steroids and admission requested for further management of COVID associated pneumonia.  Assessment and Plan: Acute respiratory failure with hypoxia Secondary to COVID-19 pneumonia -Presented with oxygen saturation 85% on room air and tachypnea -Stable on 2 L>>weaned to RA -Continue IV remdesivir--received 3 days -Continue Solu-Medrol>>d/c with prednisone x 4 more days -CRP 13.4>>9.9>>11.86>>2.39 -Ferritin 89>>108>>99 -D-dimer 2.77>>11.86 -CTA  chest--negative PE, chronic small bilateral pleural effusion, left adrenal mass, old left rib fractures, subtle right lung pleural pleural-parenchymal density -Continue Combivent for another week   Chronic HFrEF -12/17/20 EF 40-45%, mild MR,+WMA -He does have some signs of fluid overload -clinically euvolemic -hold Entresto due to rising serum creatinine--discussed with Dr. Domenic Polite -05/24/22 ReDS vest reading 29 -restart lasix after dc -repeat BMP in one week   Uncontrolled diabetes mellitus type 2 with hyperglycemia -Continue linagliptin  -05/22/22 A1C--8.7 -NovoLog sliding scale -Continue NovoLog 3 units with meals -Continue Semglee 10 units daily -CBGs controlled during the hospitalization   Prostate cancer/left adrenal mass -Left adrenal mass felt to be secondary to metastasis versus carcinoma -Follow-up Dr. Delton Coombes -Status post XRT 01/08/2022-01/23/2019   Transaminasemia -Secondary to COVID-19 infection -improved   Essential HTN -continue carvedilol   Mixed hyperlipidemia -Holding statin secondary to elevated LFTs>>restart with improvement of LFTs   Acute on chronic renal failure CKD stage IIIa -Baseline creatinine 1.2-1.4 -serum creatinine up to 1.89 -hold Entresto--discussed with pt's cardiologist, Dr. Domenic Polite -serum creatinine 1.85 on day of d/c -repeat BMP in one week after d/c   Myelodysplastic syndrome -Follow-up Dr. Delton Coombes   Paroxysmal atrial fibrillation -Currently in sinus rhythm -Not on anticoagulation long-term secondary to frailty and poor baseline function   Tobacco abuse in remission -50 pk yr -quit 20 years ago         Consultants: none Procedures performed: none  Disposition: Assisted living Diet recommendation:  Carb modified diet DISCHARGE MEDICATION: Allergies as of 05/25/2022       Reactions  Codeine Nausea And Vomiting        Medication List     STOP taking these medications    Entresto 24-26 MG Generic drug:  sacubitril-valsartan   potassium chloride 10 MEQ tablet Commonly known as: KLOR-CON       TAKE these medications    aspirin EC 81 MG tablet Take 1 tablet (81 mg total) by mouth daily. Swallow whole.   carvedilol 12.5 MG tablet Commonly known as: COREG TAKE 1 TABLET(12.5 MG) BY MOUTH TWICE DAILY WITH A MEAL   furosemide 40 MG tablet Commonly known as: LASIX TAKE 1 TABLET(40 MG) BY MOUTH DAILY What changed: See the new instructions.   Ipratropium-Albuterol 20-100 MCG/ACT Aers respimat Commonly known as: COMBIVENT Inhale 1 puff into the lungs 2 (two) times daily.   nitroGLYCERIN 0.4 MG SL tablet Commonly known as: NITROSTAT Place 1 tablet (0.4 mg total) under the tongue every 5 (five) minutes as needed.   predniSONE 50 MG tablet Commonly known as: DELTASONE Take 1 tablet (50 mg total) by mouth daily with breakfast. X 4 days Start taking on: May 26, 2022   simvastatin 40 MG tablet Commonly known as: ZOCOR TAKE 1 TABLET BY MOUTH EVERY DAY IN THE EVENING What changed:  when to take this additional instructions   Tresiba FlexTouch 100 UNIT/ML FlexTouch Pen Generic drug: insulin degludec Inject 10 Units into the skin daily.        Discharge Exam: Filed Weights   05/24/22 0340 05/25/22 0426 05/25/22 0500  Weight: 69.5 kg 70.4 kg 70.4 kg   HEENT:  [place]/AT, No thrush, no icterus CV:  RRR, no rub, no S3, no S4 Lung:  bibasilar rales.  No wheeze Abd:  soft/+BS, NT Ext:  No edema, no lymphangitis, no synovitis, no rash   Condition at discharge: stable  The results of significant diagnostics from this hospitalization (including imaging, microbiology, ancillary and laboratory) are listed below for reference.   Imaging Studies: CT Angio Chest PE W and/or Wo Contrast  Result Date: 05/22/2022 CLINICAL DATA:  89 year old with upper back pain. Pulmonary embolism suspected, high probability. EXAM: CT ANGIOGRAPHY CHEST WITH CONTRAST TECHNIQUE: Multidetector CT imaging  of the chest was performed using the standard protocol during bolus administration of intravenous contrast. Multiplanar CT image reconstructions and MIPs were obtained to evaluate the vascular anatomy. RADIATION DOSE REDUCTION: This exam was performed according to the departmental dose-optimization program which includes automated exposure control, adjustment of the mA and/or kV according to patient size and/or use of iterative reconstruction technique. CONTRAST:  4m OMNIPAQUE IOHEXOL 350 MG/ML SOLN COMPARISON:  PET-CT 11/30/2021 FINDINGS: Cardiovascular: Negative for pulmonary embolism. Post CABG changes with a left chest ICD. Coronary arteries are heavily calcified. Heart size is within normal limits and stable. Mediastinum/Nodes: Small mediastinal lymph nodes. Overall, no significant lymph node enlargement in the mediastinum, hila or axillary regions. Lungs/Pleura: Small bilateral pleural effusions, right side greater than left. Right pleural effusion is minimally changed in size. Left pleural effusion is minimally enlarged. Motion artifact limits evaluation of the lungs. Subtle parenchymal densities in the right lung. 4 mm nodular density left upper lobe on image 50/6 is probably minimally changed but this area is difficult to evaluate due to significant motion artifact. Upper Abdomen: Again noted is a large left adrenal mass that measures approximately 7.9 x 7.9 cm and minimally changed from the previous PET-CT. This is suggestive for primary adrenal neoplasm. Right adrenal gland is within normal limits. Calcified gallstones. Musculoskeletal: Old right rib fractures. Old  left posterior eleventh rib fracture. Mild deformity and sclerosis involving the T2 vertebral body appears to be similar from the previous PET-CT. No evidence for an acute vertebral body compression fracture. Review of the MIP images confirms the above findings. IMPRESSION: 1. Negative for pulmonary embolism. 2. Subtle parenchymal densities  in the right lung. Limited evaluation of the lungs due to motion artifact but cannot exclude an infectious etiology. 3. Chronic small bilateral pleural effusions that have minimally changed since 11/30/2021. 4. No significant change in the large left adrenal mass. 5. Old bilateral rib fractures.  No acute bone abnormality. 6. Cholelithiasis. Electronically Signed   By: Markus Daft M.D.   On: 05/22/2022 08:52   DG Chest Port 1 View  Result Date: 05/22/2022 CLINICAL DATA:  89 year old male with possible sepsis. Back pain. Low oxygen saturations. EXAM: PORTABLE CHEST 1 VIEW COMPARISON:  Chest x-ray 04/24/2021. FINDINGS: Widespread areas of interstitial prominence, diffuse peribronchial cuffing and patchy ill-defined opacities are noted in the lungs bilaterally (right greater than left), concerning for bronchitis with likely developing multilobar bilateral bronchopneumonia. No confluent consolidative airspace disease. No pleural effusions. No pneumothorax. No evidence of pulmonary edema. Heart size is mildly enlarged with prominence of the left ventricular contour, suggesting left ventricular hypertrophy. Upper mediastinal contours are within normal limits. Atherosclerotic calcifications are noted in the thoracic aorta. Status post median sternotomy. Left-sided pacemaker/AICD with lead tips projecting over the expected location of the right atrium and right ventricle. Old healed right-sided rib fractures are incidentally noted. IMPRESSION: 1. The appearance the chest is concerning for bronchitis with developing multilobar bilateral bronchopneumonia. Followup PA and lateral chest X-ray is recommended in 3-4 weeks following trial of antibiotic therapy to ensure resolution and exclude underlying malignancy. 2. Cardiomegaly with probable left ventricular hypertrophy. 3. Aortic atherosclerosis. Electronically Signed   By: Vinnie Langton M.D.   On: 05/22/2022 07:32   CUP PACEART REMOTE DEVICE CHECK  Result Date:  04/26/2022 Scheduled remote reviewed. Normal device function.  Next remote 91 days. Kathy Breach, RN, CCDS, [employer]   Microbiology: Results for orders placed or performed during the hospital encounter of 05/22/22  Blood Culture (routine x 2)     Status: None (Preliminary result)   Collection Time: 05/22/22  6:28 AM   Specimen: BLOOD RIGHT FOREARM  Result Value Ref Range Status   Specimen Description BLOOD RIGHT FOREARM  Final   Special Requests   Final    BOTTLES DRAWN AEROBIC AND ANAEROBIC Blood Culture results may not be optimal due to an excessive volume of blood received in culture bottles   Culture   Final    NO GROWTH 2 DAYS Performed at [facility], [address] [place], [place] [address]    Report Status PENDING  Incomplete  Urine Culture     Status: None   Collection Time: 05/22/22  6:28 AM   Specimen: In/Out Cath Urine  Result Value Ref Range Status   Specimen Description   Final    IN/OUT CATH URINE Performed at [facility], [address], [place], [place] [address]    Special Requests   Final    NONE Performed at [facility], [address], [place], [place] [address]    Culture   Final    NO GROWTH Performed at [facility], [place] [address] [place], [place] [address]    Report Status 05/23/2022 FINAL  Final  Blood Culture (routine x 2)     Status: None (Preliminary result)  Collection Time: 05/22/22  6:33 AM   Specimen: BLOOD LEFT WRIST  Result Value Ref Range Status   Specimen Description BLOOD LEFT WRIST  Final   Special Requests   Final    BOTTLES DRAWN AEROBIC AND ANAEROBIC Blood Culture adequate volume   Culture   Final    NO GROWTH 2 DAYS Performed at [facility], [address] [place], [place] [address]    Report Status PENDING  Incomplete  Resp panel by RT-PCR (RSV, Flu A&B, Covid) Anterior Nasal Swab     Status: Abnormal   Collection Time: 05/22/22  6:49 AM   Specimen: Anterior Nasal Swab  Result Value Ref Range Status    SARS Coronavirus 2 by RT PCR POSITIVE (A) NEGATIVE Final    Comment: (NOTE) SARS-CoV-2 target nucleic acids are DETECTED.  The SARS-CoV-2 RNA is generally detectable in upper respiratory specimens during the acute phase of infection. Positive results are indicative of the presence of the identified virus, but do not rule out bacterial infection or co-infection with other pathogens not detected by the test. Clinical correlation with patient history and other diagnostic information is necessary to determine patient infection status. The expected result is Negative.  Fact Sheet for Patients: EntrepreneurPulse.com.au  Fact Sheet for Healthcare Providers: IncredibleEmployment.be  This test is not yet approved or cleared by the [place] FDA and  has been authorized for detection and/or diagnosis of SARS-CoV-2 by FDA under an Emergency Use Authorization (EUA).  This EUA will remain in effect (meaning this test can be used) for the duration of  the COVID-19 declaration under Section 564(b)(1) of the A ct, 21 U.S.C. section 360bbb-3(b)(1), unless the authorization is terminated or revoked sooner.     Influenza A by PCR NEGATIVE NEGATIVE Final   Influenza B by PCR NEGATIVE NEGATIVE Final    Comment: (NOTE) The Xpert Xpress SARS-CoV-2/FLU/RSV plus assay is intended as an aid in the diagnosis of influenza from Nasopharyngeal swab specimens and should not be used as a sole basis for treatment. Nasal washings and aspirates are unacceptable for Xpert Xpress SARS-CoV-2/FLU/RSV testing.  Fact Sheet for Patients: EntrepreneurPulse.com.au  Fact Sheet for Healthcare Providers: IncredibleEmployment.be  This test is not yet approved or cleared by the [place] FDA and has been authorized for detection and/or diagnosis of SARS-CoV-2 by FDA under an Emergency Use Authorization (EUA). This EUA will remain in effect  (meaning this test can be used) for the duration of the COVID-19 declaration under Section 564(b)(1) of the Act, 21 U.S.C. section 360bbb-3(b)(1), unless the authorization is terminated or revoked.     Resp Syncytial Virus by PCR NEGATIVE NEGATIVE Final    Comment: (NOTE) Fact Sheet for Patients: EntrepreneurPulse.com.au  Fact Sheet for Healthcare Providers: IncredibleEmployment.be  This test is not yet approved or cleared by the [place] FDA and has been authorized for detection and/or diagnosis of SARS-CoV-2 by FDA under an Emergency Use Authorization (EUA). This EUA will remain in effect (meaning this test can be used) for the duration of the COVID-19 declaration under Section 564(b)(1) of the Act, 21 U.S.C. section 360bbb-3(b)(1), unless the authorization is terminated or revoked.  Performed at [facility], [address] [place],  [address]     Labs: CBC: Recent Labs  Lab 05/22/22 0628 05/23/22 0403 05/24/22 0507 05/25/22 0454  WBC 6.6 5.1 5.2 4.3  NEUTROABS 1.8 2.1 3.3 3.0  HGB 11.4* 10.5* 11.1* 11.0*  HCT 35.4* 32.6* 34.6* 34.4*  MCV 98.6 98.2 98.0 97.5  PLT  105* 78* 70* 94*   Basic Metabolic Panel: Recent Labs  Lab 05/22/22 0628 05/23/22 0403 05/24/22 0507 05/25/22 0454  NA 140 141 139 137  K 4.3 4.2 4.9 4.8  CL 103 105 105 103  CO2 '26 23 23 25  '$ GLUCOSE 238* 151* 161* 132*  BUN 44* 53* 72* 75*  CREATININE 1.84* 1.72* 1.89* 1.85*  CALCIUM 9.1 8.3* 8.1* 7.9*  MG  --  2.1 2.4 2.4  PHOS  --  5.3* 6.0*  --    Liver Function Tests: Recent Labs  Lab 05/22/22 0628 05/23/22 0403 05/24/22 0507 05/25/22 0454  AST 66* 35 22 18  ALT 71* 50* 35 30  ALKPHOS 83 61 46 43  BILITOT 1.4* 0.8 0.9 0.5  PROT 6.8 5.7* 5.6* 5.4*  ALBUMIN 3.6 2.8* 2.8* 2.7*   CBG: Recent Labs  Lab 05/24/22 1107 05/24/22 1618 05/24/22 2144 05/25/22 0423 05/25/22 0709  GLUCAP 188* 108* 149* 137* 137*    Discharge time  spent: greater than 30 minutes.  Signed: Orson Eva, MD [facility] 05/25/2022

## 2022-05-25 NOTE — Progress Notes (Signed)
Attempted to call report to receiving nurse at [facility] [phone] Michelle she will be picking pt up as well

## 2022-05-25 NOTE — TOC Transition Note (Signed)
Transition of Care [facility]) - CM/SW Discharge Note   Patient Details  Name: [name] MRN: [id number] Date of Birth: 1926/07/19  [facility] CM/SW Contact:  Ihor Gully, LCSW Phone Number: 05/25/2022, 10:53 AM   Clinical Narrative:    D/c clinicals sent to facility. Son, Sherren Mocha, notified of d/c. Facility to provide transport. TOC signing off.    Final next level of care: Assisted Living Barriers to Discharge: No Barriers Identified   Patient Goals and CMS Choice   Choice offered to / list presented to : Adult Children  Discharge Placement                  Patient to be transferred to facility by: facility Name of family member notified: son, Sherren Mocha Patient and family notified of of transfer: 05/25/22  Discharge Plan and Services Additional resources added to the After Visit Summary for   In-house Referral: Clinical Social Work                                   Social Determinants of Health (SDOH) Interventions SDOH Screenings   Food Insecurity: No Food Insecurity (05/22/2022)  Housing: Low Risk  (05/22/2022)  Transportation Needs: No Transportation Needs (05/22/2022)  Utilities: Not At Risk (05/22/2022)  Depression (PHQ2-9): Low Risk  (05/26/2019)  Tobacco Use: Medium Risk (05/22/2022)     Readmission Risk Interventions    04/25/2021    1:30 PM 04/24/2021    4:26 PM 04/17/2021    3:57 PM  Readmission Risk Prevention Plan  Transportation Screening Complete Complete Complete  Medication Review Press photographer)   Complete  PCP or Specialist appointment within 3-5 days of discharge   Complete  HRI or Home Care Consult Complete Complete Complete  SW Recovery Care/Counseling Consult Complete Complete Complete  Palliative Care Screening Complete  Not Applicable  Skilled Nursing Facility Complete Complete Complete

## 2022-05-25 NOTE — NC FL2 (Signed)
[place] LEVEL OF CARE FORM     IDENTIFICATION  Patient Name: [name] Birthdate: [birth year] Sex: male Admission Date (Current Location): 05/22/2022  [facility] and [place] Number:  [employer] and Address:  [place] [address] [place]      Provider Number: [id number]  Attending Physician Name and Address:  Orson Eva, MD  Relative Name and Phone Number:       Current Level of Care: Hospital Recommended Level of Care: Assisted Living Facility Prior Approval Number:    Date Approved/Denied:   PASRR Number:    Discharge Plan: Other (Comment) (ALF)    Current Diagnoses: Patient Active Problem List   Diagnosis Date Noted   Pneumonia due to COVID-19 virus 05/22/2022   DNR (do not resuscitate) 05/22/2022   Hyperglycemia 05/22/2022   Type 2 diabetes mellitus with vascular disease ([place]) 05/22/2022   Adrenal carcinoma, left ([place]) 12/14/2021   Left adrenal mass ([place]) 11/27/2021   Pressure injury of skin 04/20/2021   Bradycardia 04/19/2021   Heart block AV complete ([place]) 04/18/2021   Acute combined systolic and diastolic congestive heart failure ([place])    Sepsis due to undetermined organism ([place]) 04/13/2021   Lobar pneumonia ([place]) 04/13/2021   Acute respiratory failure with hypoxia and hypercarbia ([place]) 04/13/2021   Acute on chronic congestive heart failure ([place]) 04/12/2021   Acute respiratory failure with hypoxia ([place]) 04/12/2021   Unstable angina (HCC)    Family history of CABG    Chest pain 12/16/2020   Acute renal failure superimposed on stage 3 chronic kidney disease, unspecified acute renal failure type, unspecified whether stage 3a or 3b CKD ([place]) 11/07/2020   AKI (acute kidney injury) ([place]) 11/07/2020   Acute renal failure (ARF) ([place]) 11/07/2020   Gout involving toe 05/26/2019   Vitamin B 12 deficiency 07/27/2016   Myelodysplasia, low grade ([place]) 11/27/2015   Other primary cardiomyopathies 08/07/2011    Carotid stenosis 07/26/2011   Automatic implantable cardioverter-defibrillator in situ 07/26/2009   CAD, AUTOLOGOUS BYPASS GRAFT 08/24/2008   PROSTATE CANCER 08/03/2008   Other hyperlipidemia 08/03/2008   Essential hypertension 08/03/2008   MYOCARDIAL INFARCTION, HX OF 08/03/2008   Coronary artery disease 08/03/2008   Ischemic cardiomyopathy 83/15/1761   SYSTOLIC HEART FAILURE, CHRONIC 08/03/2008   NEPHROLITHIASIS, HX OF 08/03/2008   POSTSURGICAL AORTOCORONARY BYPASS STATUS 08/03/2008    Orientation RESPIRATION BLADDER Height & Weight     Self, Situation, Place  Normal Continent Weight: 155 lb 3.3 oz (70.4 kg) Height:  '5\' 6"'$  (167.6 cm)  BEHAVIORAL SYMPTOMS/MOOD NEUROLOGICAL BOWEL NUTRITION STATUS      Continent Diet (carb modified)  AMBULATORY STATUS COMMUNICATION OF NEEDS Skin   Limited Assist Verbally Other (Comment) (bruising)                       Personal Care Assistance Level of Assistance  Bathing, Feeding, Dressing Bathing Assistance: Limited assistance Feeding assistance: Limited assistance Dressing Assistance: Limited assistance     Functional Limitations Info  Sight, Hearing, Speech Sight Info: Adequate Hearing Info: Adequate Speech Info: Adequate    SPECIAL CARE FACTORS FREQUENCY                       Contractures      Additional Factors Info  Code Status, Allergies Code Status Info: DNR Allergies Info: Codeine           Current Medications (05/25/2022):  This is the  current hospital active medication list Current Facility-Administered Medications  Medication Dose Route Frequency Provider Last Rate Last Admin   acetaminophen (TYLENOL) tablet 650 mg  650 mg Oral Q6H PRN Johnson, Clanford L, MD   650 mg at 05/22/22 1340   ascorbic acid (VITAMIN C) tablet 500 mg  500 mg Oral Daily Johnson, Clanford L, MD   500 mg at 05/25/22 0910   aspirin EC tablet 81 mg  81 mg Oral Daily Johnson, Clanford L, MD   81 mg at 05/25/22 0910   bisacodyl  (DULCOLAX) EC tablet 5 mg  5 mg Oral Daily PRN Johnson, Clanford L, MD       carvedilol (COREG) tablet 12.5 mg  12.5 mg Oral BID WC Johnson, Clanford L, MD   12.5 mg at 05/25/22 0910   enoxaparin (LOVENOX) injection 30 mg  30 mg Subcutaneous Daily Johnson, Clanford L, MD   30 mg at 05/25/22 0913   guaiFENesin-dextromethorphan (ROBITUSSIN DM) 100-10 MG/5ML syrup 10 mL  10 mL Oral Q4H PRN Johnson, Clanford L, MD   10 mL at 05/24/22 1137   insulin aspart (novoLOG) injection 0-9 Units  0-9 Units Subcutaneous TID WC Johnson, Clanford L, MD   1 Units at 05/25/22 0840   insulin aspart (novoLOG) injection 3 Units  3 Units Subcutaneous TID WC Johnson, Clanford L, MD   3 Units at 05/25/22 0840   insulin glargine-yfgn (SEMGLEE) injection 10 Units  10 Units Subcutaneous Daily Wynetta Emery, Clanford L, MD   10 Units at 05/25/22 0913   Ipratropium-Albuterol (COMBIVENT) respimat 1 puff  1 puff Inhalation BID Tat, Shanon Brow, MD   1 puff at 05/25/22 0804   linagliptin (TRADJENTA) tablet 5 mg  5 mg Oral Daily Johnson, Clanford L, MD   5 mg at 05/25/22 0909   nitroGLYCERIN (NITROSTAT) SL tablet 0.4 mg  0.4 mg Sublingual Q5 min PRN Johnson, Clanford L, MD       nystatin (MYCOSTATIN/NYSTOP) topical powder   Topical BID Wynetta Emery, Clanford L, MD   Given at 05/25/22 0909   ondansetron (ZOFRAN) tablet 4 mg  4 mg Oral Q6H PRN Johnson, Clanford L, MD       Or   ondansetron (ZOFRAN) injection 4 mg  4 mg Intravenous Q6H PRN Johnson, Clanford L, MD       Oral care mouth rinse  15 mL Mouth Rinse PRN Tat, Shanon Brow, MD       oxyCODONE (Oxy IR/ROXICODONE) immediate release tablet 5 mg  5 mg Oral Q6H PRN Wynetta Emery, Clanford L, MD       [START ON 05/26/2022] predniSONE (DELTASONE) tablet 50 mg  50 mg Oral Q breakfast Tat, [name], MD       traZODone (DESYREL) tablet 25 mg  25 mg Oral QHS PRN Johnson, Clanford L, MD       zinc sulfate capsule 220 mg  220 mg Oral Daily Johnson, Clanford L, MD   220 mg at 05/25/22 0910     Discharge  Medications: TAKE these medications     aspirin EC 81 MG tablet Take 1 tablet (81 mg total) by mouth daily. Swallow whole.    carvedilol 12.5 MG tablet Commonly known as: COREG TAKE 1 TABLET(12.5 MG) BY MOUTH TWICE DAILY WITH A MEAL    furosemide 40 MG tablet Commonly known as: LASIX TAKE 1 TABLET(40 MG) BY MOUTH DAILY What changed: See the new instructions.    Ipratropium-Albuterol 20-100 MCG/ACT Aers respimat Commonly known as: COMBIVENT Inhale 1 puff into the lungs 2 (two)  times daily.    nitroGLYCERIN 0.4 MG SL tablet Commonly known as: NITROSTAT Place 1 tablet (0.4 mg total) under the tongue every 5 (five) minutes as needed.    predniSONE 50 MG tablet Commonly known as: DELTASONE Take 1 tablet (50 mg total) by mouth daily with breakfast. X 4 days Start taking on: May 26, 2022    simvastatin 40 MG tablet Commonly known as: ZOCOR TAKE 1 TABLET BY MOUTH EVERY DAY IN THE EVENING What changed:  when to take this additional instructions    Tresiba FlexTouch 100 UNIT/ML FlexTouch Pen Generic drug: insulin degludec Inject 10 Units into the skin daily.      Relevant Imaging Results:  Relevant Lab Results:   Additional Information COVID + 05/22/22  Jinna Weinman, Clydene Pugh, LCSW

## 2022-05-27 LAB — CULTURE, BLOOD (ROUTINE X 2)
Culture: NO GROWTH
Culture: NO GROWTH
Special Requests: ADEQUATE

## 2022-06-07 ENCOUNTER — Inpatient Hospital Stay: Payer: Medicare Other | Attending: Hematology

## 2022-06-07 ENCOUNTER — Ambulatory Visit (HOSPITAL_COMMUNITY)
Admission: RE | Admit: 2022-06-07 | Discharge: 2022-06-07 | Disposition: A | Discharge status: Home / Self Care | Payer: Medicare Other | Source: Ambulatory Visit | Attending: Hematology | Admitting: Hematology

## 2022-06-07 DIAGNOSIS — I700 Atherosclerosis of aorta: Secondary | ICD-10-CM | POA: Diagnosis not present

## 2022-06-07 DIAGNOSIS — E611 Iron deficiency: Secondary | ICD-10-CM

## 2022-06-07 DIAGNOSIS — K8020 Calculus of gallbladder without cholecystitis without obstruction: Secondary | ICD-10-CM | POA: Diagnosis not present

## 2022-06-07 DIAGNOSIS — E278 Other specified disorders of adrenal gland: Secondary | ICD-10-CM | POA: Diagnosis not present

## 2022-06-07 DIAGNOSIS — C7412 Malignant neoplasm of medulla of left adrenal gland: Secondary | ICD-10-CM | POA: Diagnosis not present

## 2022-06-07 DIAGNOSIS — E279 Disorder of adrenal gland, unspecified: Secondary | ICD-10-CM | POA: Diagnosis not present

## 2022-06-07 DIAGNOSIS — R935 Abnormal findings on diagnostic imaging of other abdominal regions, including retroperitoneum: Secondary | ICD-10-CM

## 2022-06-07 DIAGNOSIS — E538 Deficiency of other specified B group vitamins: Secondary | ICD-10-CM

## 2022-06-07 DIAGNOSIS — D649 Anemia, unspecified: Secondary | ICD-10-CM

## 2022-06-07 LAB — CBC WITH DIFFERENTIAL/PLATELET
Abs Immature Granulocytes: 0.08 10*3/uL — ABNORMAL HIGH (ref 0.00–0.07)
Basophils Absolute: 0 10*3/uL (ref 0.0–0.1)
Basophils Relative: 0 %
Eosinophils Absolute: 0 10*3/uL (ref 0.0–0.5)
Eosinophils Relative: 0 %
HCT: 34.3 % — ABNORMAL LOW (ref 39.0–52.0)
Hemoglobin: 11.2 g/dL — ABNORMAL LOW (ref 13.0–17.0)
Immature Granulocytes: 1 %
Lymphocytes Relative: 13 %
Lymphs Abs: 0.9 10*3/uL (ref 0.7–4.0)
MCH: 31.5 pg (ref 26.0–34.0)
MCHC: 32.7 g/dL (ref 30.0–36.0)
MCV: 96.3 fL (ref 80.0–100.0)
Monocytes Absolute: 2.2 10*3/uL — ABNORMAL HIGH (ref 0.1–1.0)
Monocytes Relative: 32 %
Neutro Abs: 3.6 10*3/uL (ref 1.7–7.7)
Neutrophils Relative %: 54 %
Platelets: 124 10*3/uL — ABNORMAL LOW (ref 150–400)
RBC: 3.56 MIL/uL — ABNORMAL LOW (ref 4.22–5.81)
RDW: 14.6 % (ref 11.5–15.5)
WBC: 6.9 10*3/uL (ref 4.0–10.5)
nRBC: 0 % (ref 0.0–0.2)

## 2022-06-07 LAB — IRON AND TIBC
Iron: 65 ug/dL (ref 45–182)
Saturation Ratios: 22 % (ref 17.9–39.5)
TIBC: 296 ug/dL (ref 250–450)
UIBC: 231 ug/dL

## 2022-06-07 LAB — FERRITIN: Ferritin: 55 ng/mL (ref 24–336)

## 2022-06-07 LAB — COMPREHENSIVE METABOLIC PANEL
ALT: 13 U/L (ref 0–44)
AST: 17 U/L (ref 15–41)
Albumin: 3.2 g/dL — ABNORMAL LOW (ref 3.5–5.0)
Alkaline Phosphatase: 42 U/L (ref 38–126)
Anion gap: 10 (ref 5–15)
BUN: 31 mg/dL — ABNORMAL HIGH (ref 8–23)
CO2: 27 mmol/L (ref 22–32)
Calcium: 8.9 mg/dL (ref 8.9–10.3)
Chloride: 101 mmol/L (ref 98–111)
Creatinine, Ser: 1.37 mg/dL — ABNORMAL HIGH (ref 0.61–1.24)
GFR, Estimated: 48 mL/min — ABNORMAL LOW (ref >60)
Glucose, Bld: 252 mg/dL — ABNORMAL HIGH (ref 70–99)
Potassium: 4.2 mmol/L (ref 3.5–5.1)
Sodium: 138 mmol/L (ref 135–145)
Total Bilirubin: 0.8 mg/dL (ref 0.3–1.2)
Total Protein: 6.1 g/dL — ABNORMAL LOW (ref 6.5–8.1)

## 2022-06-07 LAB — LACTATE DEHYDROGENASE: LDH: 239 U/L — ABNORMAL HIGH (ref 98–192)

## 2022-06-11 DIAGNOSIS — J9601 Acute respiratory failure with hypoxia: Secondary | ICD-10-CM | POA: Diagnosis not present

## 2022-06-11 DIAGNOSIS — D696 Thrombocytopenia, unspecified: Secondary | ICD-10-CM | POA: Diagnosis not present

## 2022-06-11 DIAGNOSIS — Z951 Presence of aortocoronary bypass graft: Secondary | ICD-10-CM | POA: Diagnosis not present

## 2022-06-11 DIAGNOSIS — I2510 Atherosclerotic heart disease of native coronary artery without angina pectoris: Secondary | ICD-10-CM | POA: Diagnosis not present

## 2022-06-11 DIAGNOSIS — M1A0720 Idiopathic chronic gout, left ankle and foot, without tophus (tophi): Secondary | ICD-10-CM | POA: Diagnosis not present

## 2022-06-11 DIAGNOSIS — I480 Paroxysmal atrial fibrillation: Secondary | ICD-10-CM | POA: Diagnosis not present

## 2022-06-11 DIAGNOSIS — D61818 Other pancytopenia: Secondary | ICD-10-CM | POA: Diagnosis not present

## 2022-06-11 DIAGNOSIS — E1122 Type 2 diabetes mellitus with diabetic chronic kidney disease: Secondary | ICD-10-CM | POA: Diagnosis not present

## 2022-06-11 DIAGNOSIS — U071 COVID-19: Secondary | ICD-10-CM | POA: Diagnosis not present

## 2022-06-11 DIAGNOSIS — E1169 Type 2 diabetes mellitus with other specified complication: Secondary | ICD-10-CM | POA: Diagnosis not present

## 2022-06-11 DIAGNOSIS — E782 Mixed hyperlipidemia: Secondary | ICD-10-CM | POA: Diagnosis not present

## 2022-06-11 DIAGNOSIS — I5022 Chronic systolic (congestive) heart failure: Secondary | ICD-10-CM | POA: Diagnosis not present

## 2022-06-14 ENCOUNTER — Encounter: Payer: Self-pay | Admitting: Hematology

## 2022-06-14 ENCOUNTER — Inpatient Hospital Stay: Payer: Medicare Other | Attending: Hematology | Admitting: Hematology

## 2022-06-14 VITALS — BP 149/80 | HR 76 | Temp 97.8°F | Resp 18 | Wt 151.0 lb

## 2022-06-14 DIAGNOSIS — E611 Iron deficiency: Secondary | ICD-10-CM

## 2022-06-14 DIAGNOSIS — E278 Other specified disorders of adrenal gland: Secondary | ICD-10-CM | POA: Diagnosis not present

## 2022-06-14 DIAGNOSIS — E279 Disorder of adrenal gland, unspecified: Secondary | ICD-10-CM | POA: Insufficient documentation

## 2022-06-14 DIAGNOSIS — N189 Chronic kidney disease, unspecified: Secondary | ICD-10-CM | POA: Insufficient documentation

## 2022-06-14 DIAGNOSIS — D4620 Refractory anemia with excess of blasts, unspecified: Secondary | ICD-10-CM | POA: Insufficient documentation

## 2022-06-14 DIAGNOSIS — D649 Anemia, unspecified: Secondary | ICD-10-CM | POA: Diagnosis not present

## 2022-06-14 DIAGNOSIS — C7492 Malignant neoplasm of unspecified part of left adrenal gland: Secondary | ICD-10-CM | POA: Diagnosis not present

## 2022-06-14 NOTE — Progress Notes (Signed)
[place], [place] [address]   CLINIC:  Medical Oncology/Hematology  PCP:  [name], MD [address] [name] [address] [phone]   REASON FOR VISIT:  Follow-up for adrenal mass  PRIOR THERAPY: Radiation therapy from 01/08/2022 through 01/22/2022, 45 Gray in 10 fractions  NGS Results: not done  CURRENT THERAPY: Surveillance.  BRIEF ONCOLOGIC HISTORY:  Oncology History   No history exists.    CANCER STAGING:  Cancer Staging  No matching staging information was found for the patient.  INTERVAL HISTORY:  Mr. LOUKAS ANTONSON, a 89 y.o. male, returns for follow-up of adrenal mass and anemia from low-grade MDS. He was last seen by me on 03/14/22. He is accompanied by his son for today's visit.  Since our last visit, he was admitted for x3 days started on 05/22/22 for shortness of breath and a dry cough. He was found to have COVID-related pneumonia at that time. He received IV Remdesivir and was discharged on Prednisone and a Combivent inhaler. His Entresto was held by hospitalist after consultation with his cardiologist Dr. Domenic Polite, due to worsened kidney function. He was last seen by his nephrologist Dr. Ulice Bold on 01/03/22. He saw his PCP, Dr. Allyn Kenner, for hospital follow up yesterday.  Today, he states that he is doing fair overall. He is still living at [facility] assisted living facility. He has continued to have an intermittent cough since his COVID illness but is managing well. His appetite level is at 100%. His son states that he eats very well but often eats too many sweets. His energy level is at 75%. He denies any abdominal pain.  We discussed his CT abdomen results from 06/07/22.   REVIEW OF SYSTEMS:  Review of Systems  Constitutional:  Negative for chills, fatigue and fever.  HENT:   Negative for lump/mass, mouth sores, nosebleeds, sore throat and trouble swallowing.   Respiratory:  Positive for cough and  shortness of breath.   Cardiovascular:  Negative for chest pain, leg swelling and palpitations.  Gastrointestinal:  Negative for abdominal pain, constipation, diarrhea, nausea and vomiting.  Genitourinary:  Negative for bladder incontinence, difficulty urinating, dysuria, frequency, hematuria and nocturia.   Musculoskeletal:  Negative for arthralgias, back pain, flank pain, myalgias and neck pain.  Skin:  Negative for itching and rash.  Neurological:  Negative for dizziness, headaches and numbness.  Hematological:  Does not bruise/bleed easily.  Psychiatric/Behavioral:  Negative for depression, sleep disturbance and suicidal ideas. The patient is not nervous/anxious.   All other systems reviewed and are negative.   PAST MEDICAL/SURGICAL HISTORY:  Past Medical History:  Diagnosis Date   BPH (benign prostatic hypertrophy)    CHF (congestive heart failure) ([facility])    a. EF at 45-50% in 2016 b. EF at 40-45% by repeat echo in 58/5277   Chronic systolic heart failure ([facility])    Complete heart block [facility])    [employer] pacemaker 04/2021 - Dr. Lovena Le   Coronary artery disease    a. Multivessel status post CABG 8/06, LIMA-LAD, SVG-CFX, SVG-distal RCA b. 12/2020: cath showing 3/3 patent grafts with 80% stenosis along LPAV prior to bifurication and medical management recommended.   Essential hypertension    Hyperlipidemia    Ischemic cardiomyopathy    Myelodysplasia, low grade ([facility]) 11/27/2015   Nephrolithiasis    Prostate cancer ([facility])    Vitamin B 12 deficiency 07/27/2016   Past Surgical History:  Procedure Laterality Date  CARDIAC DEFIBRILLATOR PLACEMENT     COLONOSCOPY     COLONOSCOPY N/A 08/19/2013   Procedure: COLONOSCOPY;  Surgeon: Rogene Houston, MD;  Location: [facility];  Service: Endoscopy;  Laterality: N/A;  78   CORONARY ARTERY BYPASS GRAFT     8/06 with left anterior descending artery, saphenous vein graft to circumflex, saphenous vein graft to distal right coronary artery    CYSTOSCOPY/URETEROSCOPY/HOLMIUM LASER/STENT PLACEMENT Right 11/07/2020   Procedure: CYSTOSCOPY/URETEROSCOPY/HOLMIUM LASER/STENT PLACEMENT;  Surgeon: Alexis Frock, MD;  Location: [facility];  Service: Urology;  Laterality: Right;   HERNIA REPAIR     LEFT HEART CATH AND CORS/GRAFTS ANGIOGRAPHY N/A 12/19/2020   Procedure: LEFT HEART CATH AND CORS/GRAFTS ANGIOGRAPHY;  Surgeon: Leonie Man, MD;  Location: [facility];  Service: Cardiovascular;  Laterality: N/A;   PACEMAKER IMPLANT N/A 04/24/2021   Procedure: PACEMAKER IMPLANT;  Surgeon: Evans Lance, MD;  Location: [place] [facility];  Service: Cardiovascular;  Laterality: N/A;   POLYPECTOMY     PROSTATE SURGERY     TEMPORARY PACEMAKER N/A 04/21/2021   Procedure: TEMPORARY PACEMAKER;  Surgeon: Martinique, Peter M, MD;  Location: [facility];  Service: Cardiovascular;  Laterality: N/A;    SOCIAL HISTORY:  Social History   Socioeconomic History   Marital status: Married    Spouse name: Not on file   Number of children: Not on file   Years of education: Not on file   Highest education level: Not on file  Occupational History   Occupation: Retired    Fish farm manager: RETIRED  Tobacco Use   Smoking status: Former    Packs/day: 0.25    Years: 44.00    Total pack years: 11.00    Types: Cigarettes    Quit date: 05/14/1992    Years since quitting: 30.1   Smokeless tobacco: Never  Vaping Use   Vaping Use: Never used  Substance and Sexual Activity   Alcohol use: No   Drug use: No   Sexual activity: Not on file    Comment: married 86 years  Other Topics Concern   Not on file  Social History Narrative   Not on file   Social Determinants of Health   Financial Resource Strain: Not on file  Food Insecurity: No Food Insecurity (05/22/2022)   Hunger Vital Sign    Worried About Running Out of Food in the Last Year: Never true    Ran Out of Food in the Last Year: Never true  Transportation Needs: No Transportation Needs (05/22/2022)    PRAPARE - Hydrologist (Medical): No    Lack of Transportation (Non-Medical): No  Physical Activity: Not on file  Stress: Not on file  Social Connections: Not on file  Intimate Partner Violence: Not At Risk (05/22/2022)   Humiliation, Afraid, Rape, and Kick questionnaire    Fear of Current or Ex-Partner: No    Emotionally Abused: No    Physically Abused: No    Sexually Abused: No    FAMILY HISTORY:  Family History  Problem Relation Age of Onset   Colon cancer Father    Coronary artery disease Other     CURRENT MEDICATIONS:  Current Outpatient Medications  Medication Sig Dispense Refill   aspirin EC 81 MG tablet Take 1 tablet (81 mg total) by mouth daily. Swallow whole. 90 tablet 3   carvedilol (COREG) 12.5 MG tablet TAKE 1 TABLET(12.5 MG) BY MOUTH TWICE DAILY WITH A MEAL 180 tablet 3  furosemide (LASIX) 40 MG tablet TAKE 1 TABLET(40 MG) BY MOUTH DAILY (Patient taking differently: Take 20 mg by mouth daily.) 90 tablet 3   insulin degludec (TRESIBA FLEXTOUCH) 100 UNIT/ML FlexTouch Pen Inject 10 Units into the skin daily.     Ipratropium-Albuterol (COMBIVENT) 20-100 MCG/ACT AERS respimat Inhale 1 puff into the lungs 2 (two) times daily. 1 each 0   nitroGLYCERIN (NITROSTAT) 0.4 MG SL tablet Place 1 tablet (0.4 mg total) under the tongue every 5 (five) minutes as needed. 25 tablet 3   predniSONE (DELTASONE) 50 MG tablet Take 1 tablet (50 mg total) by mouth daily with breakfast. X 4 days 4 tablet 0   simvastatin (ZOCOR) 40 MG tablet TAKE 1 TABLET BY MOUTH EVERY DAY IN THE EVENING 90 tablet 2   No current facility-administered medications for this visit.    ALLERGIES:  Allergies  Allergen Reactions   Codeine Nausea And Vomiting    PHYSICAL EXAM:  Performance status (ECOG): 2 - Symptomatic, <50% confined to bed  Vitals:   06/14/22 1512  BP: (!) 149/80  Pulse: 76  Resp: 18  Temp: 97.8 F (36.6 C)  SpO2: 90%    Wt Readings from Last 3  Encounters:  06/14/22 68.5 kg (151 lb 0.2 oz)  05/25/22 70.4 kg (155 lb 3.3 oz)  03/14/22 66.3 kg (146 lb 3.2 oz)   Physical Exam Vitals and nursing note reviewed. Exam conducted with a chaperone present.  Constitutional:      Appearance: Normal appearance.  Cardiovascular:     Rate and Rhythm: Normal rate and regular rhythm.     Pulses: Normal pulses.     Heart sounds: Normal heart sounds.  Pulmonary:     Effort: Pulmonary effort is normal.     Breath sounds: Normal breath sounds.  Abdominal:     Palpations: Abdomen is soft. There is no hepatomegaly, splenomegaly or mass.     Tenderness: There is no abdominal tenderness.  Lymphadenopathy:     Cervical: No cervical adenopathy.     Right cervical: No superficial cervical adenopathy.    Left cervical: No superficial cervical adenopathy.  Neurological:     General: No focal deficit present.     Mental Status: He is alert and oriented to person, place, and time.  Psychiatric:        Mood and Affect: Mood normal.        Behavior: Behavior normal.      LABORATORY DATA:  I have reviewed the labs as listed.     Latest Ref Rng & Units 06/07/2022    1:10 PM 05/25/2022    4:54 AM 05/24/2022    5:07 AM  CBC  WBC 4.0 - 10.5 K/uL 6.9  4.3  5.2   Hemoglobin 13.0 - 17.0 g/dL 11.2  11.0  11.1   Hematocrit 39.0 - 52.0 % 34.3  34.4  34.6   Platelets 150 - 400 K/uL 124  94  70       Latest Ref Rng & Units 06/07/2022    1:10 PM 05/25/2022    4:54 AM 05/24/2022    5:07 AM  CMP  Glucose 70 - 99 mg/dL 252  132  161   BUN 8 - 23 mg/dL 31  75  72   Creatinine 0.61 - 1.24 mg/dL 1.37  1.85  1.89   Sodium 135 - 145 mmol/L 138  137  139   Potassium 3.5 - 5.1 mmol/L 4.2  4.8  4.9   Chloride 98 -  111 mmol/L 101  103  105   CO2 22 - 32 mmol/L '27  25  23   '$ Calcium 8.9 - 10.3 mg/dL 8.9  7.9  8.1   Total Protein 6.5 - 8.1 g/dL 6.1  5.4  5.6   Total Bilirubin 0.3 - 1.2 mg/dL 0.8  0.5  0.9   Alkaline Phos 38 - 126 U/L 42  43  46   AST 15 - 41 U/L  '17  18  22   '$ ALT 0 - 44 U/L 13  30  35     DIAGNOSTIC IMAGING:  I have independently reviewed the scans and discussed with the patient. CT Abdomen Wo Contrast  Result Date: 06/07/2022 CLINICAL DATA:  Follow-up adrenal mass. EXAM: CT ABDOMEN WITHOUT CONTRAST TECHNIQUE: Multidetector CT imaging of the abdomen was performed following the standard protocol without IV contrast. RADIATION DOSE REDUCTION: This exam was performed according to the departmental dose-optimization program which includes automated exposure control, adjustment of the mA and/or kV according to patient size and/or use of iterative reconstruction technique. COMPARISON:  Chest CTA on 05/22/2022, and PET-CT on 11/30/2021 FINDINGS: Lower chest: Small bilateral pleural effusions show no significant change compared to recent chest CTA. Hepatobiliary: No masses visualized on this unenhanced exam. Calcified gallstones are noted, however there is no evidence of cholecystitis or biliary ductal dilatation. Pancreas: No mass or inflammatory process visualized on this unenhanced exam. Spleen:  Within normal limits in size. Adrenals/Urinary tract: Left adrenal soft tissue mass measures 7.5 x 7.0 cm, mildly decreased in size from 8.0 x 7.7 cm on prior PET-CT. No evidence of nephrolithiasis or hydronephrosis. Stomach/Bowel: Unremarkable. Vascular/Lymphatic: No pathologically enlarged lymph nodes identified. No evidence of abdominal aortic aneurysm. Aortic atherosclerotic calcification incidentally noted. Other:  None. Musculoskeletal:  No suspicious bone lesions identified. IMPRESSION: 7.5 cm left adrenal soft tissue mass shows slight decrease in size compared to prior PET-CT. Stable small bilateral pleural effusions. Cholelithiasis. No radiographic evidence of cholecystitis. Electronically Signed   By: Marlaine Hind M.D.   On: 06/07/2022 16:34   CT Angio Chest PE W and/or Wo Contrast  Result Date: 05/22/2022 CLINICAL DATA:  89 year old with upper back  pain. Pulmonary embolism suspected, high probability. EXAM: CT ANGIOGRAPHY CHEST WITH CONTRAST TECHNIQUE: Multidetector CT imaging of the chest was performed using the standard protocol during bolus administration of intravenous contrast. Multiplanar CT image reconstructions and MIPs were obtained to evaluate the vascular anatomy. RADIATION DOSE REDUCTION: This exam was performed according to the departmental dose-optimization program which includes automated exposure control, adjustment of the mA and/or kV according to patient size and/or use of iterative reconstruction technique. CONTRAST:  14m OMNIPAQUE IOHEXOL 350 MG/ML SOLN COMPARISON:  PET-CT 11/30/2021 FINDINGS: Cardiovascular: Negative for pulmonary embolism. Post CABG changes with a left chest ICD. Coronary arteries are heavily calcified. Heart size is within normal limits and stable. Mediastinum/Nodes: Small mediastinal lymph nodes. Overall, no significant lymph node enlargement in the mediastinum, hila or axillary regions. Lungs/Pleura: Small bilateral pleural effusions, right side greater than left. Right pleural effusion is minimally changed in size. Left pleural effusion is minimally enlarged. Motion artifact limits evaluation of the lungs. Subtle parenchymal densities in the right lung. 4 mm nodular density left upper lobe on image 50/6 is probably minimally changed but this area is difficult to evaluate due to significant motion artifact. Upper Abdomen: Again noted is a large left adrenal mass that measures approximately 7.9 x 7.9 cm and minimally changed from the previous PET-CT. This is suggestive for  primary adrenal neoplasm. Right adrenal gland is within normal limits. Calcified gallstones. Musculoskeletal: Old right rib fractures. Old left posterior eleventh rib fracture. Mild deformity and sclerosis involving the T2 vertebral body appears to be similar from the previous PET-CT. No evidence for an acute vertebral body compression fracture.  Review of the MIP images confirms the above findings. IMPRESSION: 1. Negative for pulmonary embolism. 2. Subtle parenchymal densities in the right lung. Limited evaluation of the lungs due to motion artifact but cannot exclude an infectious etiology. 3. Chronic small bilateral pleural effusions that have minimally changed since 11/30/2021. 4. No significant change in the large left adrenal mass. 5. Old bilateral rib fractures.  No acute bone abnormality. 6. Cholelithiasis. Electronically Signed   By: Markus Daft M.D.   On: 05/22/2022 08:52   DG Chest Port 1 View  Result Date: 05/22/2022 CLINICAL DATA:  89 year old male with possible sepsis. Back pain. Low oxygen saturations. EXAM: PORTABLE CHEST 1 VIEW COMPARISON:  Chest x-ray 04/24/2021. FINDINGS: Widespread areas of interstitial prominence, diffuse peribronchial cuffing and patchy ill-defined opacities are noted in the lungs bilaterally (right greater than left), concerning for bronchitis with likely developing multilobar bilateral bronchopneumonia. No confluent consolidative airspace disease. No pleural effusions. No pneumothorax. No evidence of pulmonary edema. Heart size is mildly enlarged with prominence of the left ventricular contour, suggesting left ventricular hypertrophy. Upper mediastinal contours are within normal limits. Atherosclerotic calcifications are noted in the thoracic aorta. Status post median sternotomy. Left-sided pacemaker/AICD with lead tips projecting over the expected location of the right atrium and right ventricle. Old healed right-sided rib fractures are incidentally noted. IMPRESSION: 1. The appearance the chest is concerning for bronchitis with developing multilobar bilateral bronchopneumonia. Followup PA and lateral chest X-ray is recommended in 3-4 weeks following trial of antibiotic therapy to ensure resolution and exclude underlying malignancy. 2. Cardiomegaly with probable left ventricular hypertrophy. 3. Aortic  atherosclerosis. Electronically Signed   By: Vinnie Langton M.D.   On: 05/22/2022 07:32     ASSESSMENT:  1.  Left adrenal mass - During hospitalization last summer (June 2022), CT abdomen/pelvis showed 5.5 cm left adrenal mass with imaging features suspicious for adrenal carcinoma or metastases.  Oncological work-up was recommended by radiologist, urologist, and hospitalist team, but results were never relayed to oncology clinic until July 2023 when patient's nephrologist (Dr. Theador Hawthorne) brought it to our attention. -PET scan (12/08/2021): Left adrenal mass 8 x 7.7 cm with SUV 5.73.  CT scan previously measured 5.5 x 4.8 cm 1 year ago.  Numerous sclerotic bone lesions mainly in the ribs with no hypermetabolic some.  No evidence of metastatic disease - He denies any cancer in his siblings or parents. - XRT 45 Gray in 10 fractions (01/08/2022 - 01/22/2022)    2.  Low-grade MDS - Bone marrow biopsy on 10/27/2015 showed hypercellular marrow with mild dyserythropoiesis and dismegakaryocytes.  Chromosome analysis was normal.  FISH panel was normal.  Flow cytometry on peripheral blood did not show any monoclonal B cell population. - He has normocytic anemia which is stable between 11 and 12.  - He also has moderate thrombocytopenia which is stable between 80 and 90,000. - Most recent labs (07/24/2021):  Hgb 12.1, WBC 4.0 with ANC 1.5, platelets 133   PLAN:  1.  Left adrenal mass: - He is currently a resident at [facility] assisted living facility. - He denies any abdominal pains. - Labs showed normal LFTs.  CBC was grossly normal with mild thrombocytopenia stable. - CT abdomen  on 06/07/2022 shows 7.5 cm left adrenal soft tissue mass with slight decrease in size compared to PET scan from July 2023.  Stable small bilateral pleural effusions. - Recommend follow-up in 6 months with repeat imaging.    2.  Low-grade MDS: - Anemia from MDS, CKD and relative iron deficiency. - Ferritin is 55, percent  saturation 22.  Creatinine 1.37.  Hemoglobin 11.2. - Recommend starting iron tablet 3 times weekly with stool softener if needed. - I plan to repeat ferritin and iron panel at next visit.    Orders placed this encounter:  No orders of the defined types were placed in this encounter.   I,Alexis Herring,acting as a Education administrator for [employer], MD.,have documented all relevant documentation on the behalf of Derek Jack, MD,as directed by  Derek Jack, MD while in the presence of Derek Jack, MD.  I, Derek Jack MD, have reviewed the above documentation for accuracy and completeness, and I agree with the above.    Derek Jack, MD [place] [phone]

## 2022-06-14 NOTE — Patient Instructions (Signed)
[place] [facility]  Discharge Instructions  You were seen and examined today by Dr. Delton Coombes.  Dr. Delton Coombes discussed your most recent lab work and CT scan which revealed that everything looks really good except your iron is low.  Start taking iron over the counter three times a week.  Follow-up as scheduled in 6 months with labs and CT abdomen and pelvis prior.    Thank you for choosing [facility] to provide your oncology and hematology care.   To afford each patient quality time with our provider, please arrive at least 15 minutes before your scheduled appointment time. You may need to reschedule your appointment if you arrive late (10 or more minutes). Arriving late affects you and other patients whose appointments are after yours.  Also, if you miss three or more appointments without notifying the office, you may be dismissed from the clinic at the provider's discretion.    Again, thank you for choosing [facility].  Our hope is that these requests will decrease the amount of time that you wait before being seen by our physicians.   If you have a lab appointment with the [facility] please come in thru the Main Entrance and check in at the main information desk.           _____________________________________________________________  Should you have questions after your visit to [facility], please contact our office at [phone] and follow the prompts.  Our office hours are 8:00 a.m. to 4:30 p.m. Monday - Thursday and 8:00 a.m. to 2:30 p.m. Friday.  Please note that voicemails left after 4:00 p.m. may not be returned until the following business day.  We are closed weekends and all major holidays.  You do have access to a nurse 24-7, just call the main number to the clinic [phone] and do not press any options, hold on the line and a nurse will answer the phone.    For prescription refill  requests, have your pharmacy contact our office and allow 72 hours.    Masks are optional in the cancer centers. If you would like for your care team to wear a mask while they are taking care of you, please let them know. You may have one support person who is at least 87 years old accompany you for your appointments.

## 2022-06-28 NOTE — Progress Notes (Deleted)
Cardiology Office Note  Date: 06/28/2022   ID: [name], DOB [id number], MRN [id number]  PCP:  [name], MD  Cardiologist:  Rozann Lesches, MD Electrophysiologist:  Cristopher Peru, MD   No chief complaint on file.   History of Present Illness: [name] is a 89 y.o. male last seen in November 2023 by Ms. Strader PA-C.  He presents for follow-up, was hospitalized in January with hypoxic respiratory failure associated with COVID-19.  He resides at [facility].  I note that he was taken off Entresto given rising creatinine during hospital stay.  Medtronic dual-chamber pacemaker in place with followed by Dr. Lovena Le.  Device check in December 2023 revealed normal function.  Past Medical History:  Diagnosis Date   BPH (benign prostatic hypertrophy)    CHF (congestive heart failure) ([place])    a. EF at 45-50% in 2016 b. EF at 40-45% by repeat echo in [id number]   Chronic systolic heart failure ([place])    Complete heart block [facility])    Medtronic pacemaker 04/2021 - Dr. Lovena Le   Coronary artery disease    a. Multivessel status post CABG 8/06, LIMA-LAD, SVG-CFX, SVG-distal RCA b. 12/2020: cath showing 3/3 patent grafts with 80% stenosis along LPAV prior to bifurication and medical management recommended.   Essential hypertension    Hyperlipidemia    Ischemic cardiomyopathy    Myelodysplasia, low grade (HCC) 11/27/2015   Nephrolithiasis    Prostate cancer ([place])    Vitamin B 12 deficiency 07/27/2016    Current Outpatient Medications  Medication Sig Dispense Refill   aspirin EC 81 MG tablet Take 1 tablet (81 mg total) by mouth daily. Swallow whole. 90 tablet 3   carvedilol (COREG) 12.5 MG tablet TAKE 1 TABLET(12.5 MG) BY MOUTH TWICE DAILY WITH A MEAL 180 tablet 3   furosemide (LASIX) 40 MG tablet TAKE 1 TABLET(40 MG) BY MOUTH DAILY (Patient taking differently: Take 20 mg by mouth daily.) 90 tablet 3   insulin degludec (TRESIBA FLEXTOUCH) 100 UNIT/ML FlexTouch Pen Inject 10 Units  into the skin daily.     Ipratropium-Albuterol (COMBIVENT) 20-100 MCG/ACT AERS respimat Inhale 1 puff into the lungs 2 (two) times daily. 1 each 0   nitroGLYCERIN (NITROSTAT) 0.4 MG SL tablet Place 1 tablet (0.4 mg total) under the tongue every 5 (five) minutes as needed. 25 tablet 3   predniSONE (DELTASONE) 50 MG tablet Take 1 tablet (50 mg total) by mouth daily with breakfast. X 4 days 4 tablet 0   simvastatin (ZOCOR) 40 MG tablet TAKE 1 TABLET BY MOUTH EVERY DAY IN THE EVENING 90 tablet 2   No current facility-administered medications for this visit.   Allergies:  Codeine   ROS:  Please see the history of present illness. Otherwise, complete review of systems is positive for {NONE DEFAULTED:18576}.  All other systems are reviewed and negative.   Physical Exam: VS:  There were no vitals taken for this visit., BMI There is no height or weight on file to calculate BMI.  Wt Readings from Last 3 Encounters:  06/14/22 151 lb 0.2 oz (68.5 kg)  05/25/22 155 lb 3.3 oz (70.4 kg)  03/14/22 146 lb 3.2 oz (66.3 kg)    General: Patient appears comfortable at rest. HEENT: Conjunctiva and lids normal, oropharynx clear with moist mucosa. Neck: Supple, no elevated JVP or carotid bruits, no thyromegaly. Lungs: Clear to auscultation, nonlabored breathing at rest. Cardiac: Regular rate and rhythm, no S3 or significant systolic murmur, no pericardial rub. Abdomen:  Soft, nontender, no hepatomegaly, bowel sounds present, no guarding or rebound. Extremities: No pitting edema, distal pulses 2+. Skin: Warm and dry. Musculoskeletal: No kyphosis. Neuropsychiatric: Alert and oriented x3, affect grossly appropriate.  ECG:  An ECG dated 05/22/2022 was personally reviewed today and demonstrated:  Sinus tachycardia with ventricular pacing.  Recent Labwork: 05/23/2022: B Natriuretic Peptide 1,906.0 05/25/2022: Magnesium 2.4 06/07/2022: ALT 13; AST 17; BUN 31; Creatinine, Ser 1.37; Hemoglobin 11.2; Platelets 124;  Potassium 4.2; Sodium 138     Component Value Date/Time   CHOL 125 12/17/2020 0137   TRIG 71 12/17/2020 0137   HDL 29 (L) 12/17/2020 0137   CHOLHDL 4.3 12/17/2020 0137   VLDL 14 12/17/2020 0137   LDLCALC 82 12/17/2020 0137   LDLCALC 78 06/09/2019 0808    Other Studies Reviewed Today:  Echocardiogram 12/17/2020: 1. Poor acoustic windows.   2. Poor acoustic windows limit study Endocardium is not seen in all  segments even with DEFINITY There appears to be hypokinesis of the basal  inferior, basal inferoseptal, the inferolaterall and apical walls OVerall  LVEF appears mildly depressed LVEF 40  to 45%, again EF difficult given poor image quality. . The left  ventricular internal cavity size was severely dilated. There is mild left  ventricular hypertrophy. Left ventricular diastolic parameters are  indeterminate.   3. Device lead seen in RV to distal apex. Right ventricular systolic  function is normal. The right ventricular size is normal. There is  moderately elevated pulmonary artery systolic pressure.   4. Left atrial size was severely dilated.   5. The mitral valve is normal in structure. Mild mitral valve  regurgitation.   6. The aortic valve is tricuspid. Aortic valve regurgitation is not  visualized. Mild to moderate aortic valve sclerosis/calcification is  present, without any evidence of aortic stenosis.   7. The inferior vena cava is dilated in size with <50% respiratory  variability, suggesting right atrial pressure of 15 mmHg.   Assessment and Plan:   Medication Adjustments/Labs and Tests Ordered: Current medicines are reviewed at length with the patient today.  Concerns regarding medicines are outlined above.   Tests Ordered: No orders of the defined types were placed in this encounter.   Medication Changes: No orders of the defined types were placed in this encounter.   Disposition:  Follow up {follow up:15908}  Signed, Satira Sark, MD,  [facility] 06/28/2022 4:30 PM    [facility] at [facility] [address] [place], [place] [address] Phone: [phone]; Fax: [phone]

## 2022-07-03 ENCOUNTER — Ambulatory Visit: Payer: Medicare Other | Admitting: Cardiology

## 2022-07-03 DIAGNOSIS — I5022 Chronic systolic (congestive) heart failure: Secondary | ICD-10-CM

## 2022-07-03 NOTE — Progress Notes (Unsigned)
Cardiology Office Note  Date: 07/04/2022   ID: [name], DOB [birth year], MRN [id number]  PCP:  [name], MD  Cardiologist:  Rozann Lesches, MD Electrophysiologist:  Cristopher Peru, MD   Chief Complaint  Patient presents with   Cardiac follow-up    History of Present Illness: [name] is a 89 y.o. male last seen in November 2023 by Ms. Strader PA-C, I reviewed the note.  He is here with an Environmental consultant from [place].  I reviewed interval records. He was hospitalized in January of this year with hypoxic respiratory failure in the setting of COVID-19.  Blood pressure relatively low during that time and he was taken off of Entresto at least temporarily.  I was able to get his most recent blood pressures at [facility]: 130/70, 140/80, 132/76.  We went over his medications and we will plan to resume Entresto as before.  He tells me that he feels back to his prior baseline, generally NYHA class II-III dyspnea, walks to limited degree using a walker.  No recent falls.  No chest pain or palpitations.  Medtronic dual-chamber pacemaker in place with follow-up with Dr. Lovena Le.  Device interrogation in December 2023 revealed normal function.   Past Medical History:  Diagnosis Date   BPH (benign prostatic hypertrophy)    CHF (congestive heart failure) ([place])    a. EF at 45-50% in 2016 b. EF at 40-45% by repeat echo in [id number]   Chronic systolic heart failure ([place])    Complete heart block [facility])    Medtronic pacemaker 04/2021 - Dr. Lovena Le   Coronary artery disease    a. Multivessel status post CABG 8/06, LIMA-LAD, SVG-CFX, SVG-distal RCA b. 12/2020: cath showing 3/3 patent grafts with 80% stenosis along LPAV prior to bifurication and medical management recommended.   Essential hypertension    Hyperlipidemia    Ischemic cardiomyopathy    Myelodysplasia, low grade (HCC) 11/27/2015   Nephrolithiasis    Prostate cancer ([place])    Vitamin B 12 deficiency 07/27/2016    Current  Outpatient Medications  Medication Sig Dispense Refill   aspirin EC 81 MG tablet Take 1 tablet (81 mg total) by mouth daily. Swallow whole. 90 tablet 3   carvedilol (COREG) 12.5 MG tablet TAKE 1 TABLET(12.5 MG) BY MOUTH TWICE DAILY WITH A MEAL 180 tablet 3   docusate sodium (COLACE) 100 MG capsule Take 100 mg by mouth 2 (two) times daily.     ferrous sulfate 325 (65 FE) MG EC tablet Take 325 mg by mouth 3 (three) times daily with meals.     furosemide (LASIX) 40 MG tablet TAKE 1 TABLET(40 MG) BY MOUTH DAILY (Patient taking differently: Take 20 mg by mouth daily.) 90 tablet 3   guaifenesin (ROBITUSSIN) 100 MG/5ML syrup Take 200 mg by mouth 3 (three) times daily as needed for cough.     insulin degludec (TRESIBA FLEXTOUCH) 100 UNIT/ML FlexTouch Pen Inject 10 Units into the skin daily.     Ipratropium-Albuterol (COMBIVENT) 20-100 MCG/ACT AERS respimat Inhale 1 puff into the lungs 2 (two) times daily. 1 each 0   nitroGLYCERIN (NITROSTAT) 0.4 MG SL tablet Place 1 tablet (0.4 mg total) under the tongue every 5 (five) minutes as needed. 25 tablet 3   potassium chloride (KLOR-CON M) 10 MEQ tablet Take 10 mEq by mouth 2 (two) times daily.     sacubitril-valsartan (ENTRESTO) 24-26 MG Take 1 tablet by mouth 2 (two) times daily. 60 tablet 11   simvastatin (ZOCOR) 40  MG tablet TAKE 1 TABLET BY MOUTH EVERY DAY IN THE EVENING 90 tablet 2   predniSONE (DELTASONE) 50 MG tablet Take 1 tablet (50 mg total) by mouth daily with breakfast. X 4 days 4 tablet 0   No current facility-administered medications for this visit.   Allergies:  Codeine   ROS: No syncope.  Physical Exam: VS:  BP 128/76   Pulse 64   Ht 5' 6"$  (1.676 m)   Wt 143 lb 12.8 oz (65.2 kg)   SpO2 97%   BMI 23.21 kg/m , BMI Body mass index is 23.21 kg/m.  Wt Readings from Last 3 Encounters:  07/04/22 143 lb 12.8 oz (65.2 kg)  06/14/22 151 lb 0.2 oz (68.5 kg)  05/25/22 155 lb 3.3 oz (70.4 kg)    General: Patient appears comfortable at rest.   Using a walker. HEENT: Conjunctiva and lids normal. Neck: Supple, no elevated JVP or carotid bruits. Lungs: Clear to auscultation, nonlabored breathing at rest. Cardiac: Regular rate and rhythm, no S3, 2/6 systolic murmur. Extremities: No pitting edema.  ECG:  An ECG dated 05/22/2022 was personally reviewed today and demonstrated:  Sinus tachycardia with ventricular pacing.  Recent Labwork: 05/23/2022: B Natriuretic Peptide 1,906.0 05/25/2022: Magnesium 2.4 06/07/2022: ALT 13; AST 17; BUN 31; Creatinine, Ser 1.37; Hemoglobin 11.2; Platelets 124; Potassium 4.2; Sodium 138     Component Value Date/Time   CHOL 125 12/17/2020 0137   TRIG 71 12/17/2020 0137   HDL 29 (L) 12/17/2020 0137   CHOLHDL 4.3 12/17/2020 0137   VLDL 14 12/17/2020 0137   LDLCALC 82 12/17/2020 0137   LDLCALC 78 06/09/2019 0808    Other Studies Reviewed Today:  Echocardiogram 12/17/2020:  1. Poor acoustic windows.   2. Poor acoustic windows limit study Endocardium is not seen in all  segments even with DEFINITY There appears to be hypokinesis of the basal  inferior, basal inferoseptal, the inferolaterall and apical walls OVerall  LVEF appears mildly depressed LVEF 40  to 45%, again EF difficult given poor image quality. . The left  ventricular internal cavity size was severely dilated. There is mild left  ventricular hypertrophy. Left ventricular diastolic parameters are  indeterminate.   3. Device lead seen in RV to distal apex. Right ventricular systolic  function is normal. The right ventricular size is normal. There is  moderately elevated pulmonary artery systolic pressure.   4. Left atrial size was severely dilated.   5. The mitral valve is normal in structure. Mild mitral valve  regurgitation.   6. The aortic valve is tricuspid. Aortic valve regurgitation is not  visualized. Mild to moderate aortic valve sclerosis/calcification is  present, without any evidence of aortic stenosis.   7. The inferior vena  cava is dilated in size with <50% respiratory  variability, suggesting right atrial pressure of 15 mmHg.   Assessment and Plan:  1.  HFmrEF, LVEF 40 to 45% with known ischemic cardiomyopathy.  He is relatively stable with NYHA class II-III symptoms, no obvious fluid retention and weight is down.  Plan to resume Entresto 24/26 mg twice daily.  Continue Coreg, Lasix, and potassium supplement.  2.  Complete heart block status post Medtronic pacemaker implantation with follow-up by Dr. Lovena Le.  Most recent device interrogation showed normal function.  3.  Multivessel CAD status post CABG with plan to continue medical therapy.  He reports no progressive angina.  Continue aspirin and Zocor.  He has as needed nitroglycerin available.  Medication Adjustments/Labs and Tests Ordered: Current medicines  are reviewed at length with the patient today.  Concerns regarding medicines are outlined above.   Tests Ordered: No orders of the defined types were placed in this encounter.   Medication Changes: Meds ordered this encounter  Medications   sacubitril-valsartan (ENTRESTO) 24-26 MG    Sig: Take 1 tablet by mouth 2 (two) times daily.    Dispense:  60 tablet    Refill:  11    Disposition:  Follow up  6 months.  Signed, Luke Sark, MD, [facility] 07/04/2022 10:38 AM    [facility] [place]. [address] [place], [place] [address] Phone: [phone]; Fax: [phone]

## 2022-07-04 ENCOUNTER — Ambulatory Visit: Payer: Medicare Other | Attending: Cardiology | Admitting: Cardiology

## 2022-07-04 ENCOUNTER — Encounter: Payer: Self-pay | Admitting: Cardiology

## 2022-07-04 VITALS — BP 128/76 | HR 64 | Ht 66.0 in | Wt 143.8 lb

## 2022-07-04 DIAGNOSIS — I25119 Atherosclerotic heart disease of native coronary artery with unspecified angina pectoris: Secondary | ICD-10-CM | POA: Diagnosis not present

## 2022-07-04 DIAGNOSIS — I5022 Chronic systolic (congestive) heart failure: Secondary | ICD-10-CM | POA: Diagnosis not present

## 2022-07-04 MED ORDER — SACUBITRIL-VALSARTAN 24-26 MG PO TABS: 1.0000 | ORAL_TABLET | Freq: Two times a day (BID) | ORAL | 11 refills | Status: DC

## 2022-07-04 NOTE — Patient Instructions (Signed)
Medication Instructions:  Your physician has recommended you make the following change in your medication:  - Start Entresto 24-26 mg tablets twice daily.   Labwork: None  Testing/Procedures: None  Follow-Up: Follow up with Dr. Domenic Polite in 6 months.   Any Other Special Instructions Will Be Listed Below (If Applicable).     If you need a refill on your cardiac medications before your next appointment, please call your pharmacy.

## 2022-07-26 ENCOUNTER — Ambulatory Visit: Payer: Medicare Other

## 2022-07-26 DIAGNOSIS — I442 Atrioventricular block, complete: Secondary | ICD-10-CM | POA: Diagnosis not present

## 2022-07-27 LAB — CUP PACEART REMOTE DEVICE CHECK
Battery Remaining Longevity: 110 mo
Battery Voltage: 3.03 V
Brady Statistic AP VP Percent: 28.59 %
Brady Statistic AP VS Percent: 0 %
Brady Statistic AS VP Percent: 70.15 %
Brady Statistic AS VS Percent: 1.26 %
Brady Statistic RA Percent Paced: 29.37 %
Brady Statistic RV Percent Paced: 98.73 %
Date Time Interrogation Session: 20240313223533
Implantable Lead Connection Status: 753985
Implantable Lead Connection Status: 753985
Implantable Lead Implant Date: 20071018
Implantable Lead Implant Date: 20221212
Implantable Lead Location: 753859
Implantable Lead Location: 753860
Implantable Lead Model: 5076
Implantable Lead Model: 6947
Implantable Pulse Generator Implant Date: 20221212
Lead Channel Impedance Value: 247 Ohm
Lead Channel Impedance Value: 285 Ohm
Lead Channel Impedance Value: 342 Ohm
Lead Channel Impedance Value: 361 Ohm
Lead Channel Pacing Threshold Amplitude: 0.5 V
Lead Channel Pacing Threshold Amplitude: 0.625 V
Lead Channel Pacing Threshold Pulse Width: 0.4 ms
Lead Channel Pacing Threshold Pulse Width: 0.4 ms
Lead Channel Sensing Intrinsic Amplitude: 0.5 mV
Lead Channel Sensing Intrinsic Amplitude: 0.5 mV
Lead Channel Sensing Intrinsic Amplitude: 10.375 mV
Lead Channel Sensing Intrinsic Amplitude: 10.375 mV
Lead Channel Setting Pacing Amplitude: 2 V
Lead Channel Setting Pacing Amplitude: 2.5 V
Lead Channel Setting Pacing Pulse Width: 0.4 ms
Lead Channel Setting Sensing Sensitivity: 5.6 mV
Zone Setting Status: 755011
Zone Setting Status: 755011

## 2022-08-07 ENCOUNTER — Encounter: Payer: Medicare Other | Admitting: Internal Medicine

## 2022-08-16 DIAGNOSIS — I5022 Chronic systolic (congestive) heart failure: Secondary | ICD-10-CM | POA: Diagnosis not present

## 2022-08-16 DIAGNOSIS — R809 Proteinuria, unspecified: Secondary | ICD-10-CM | POA: Diagnosis not present

## 2022-08-16 DIAGNOSIS — J9601 Acute respiratory failure with hypoxia: Secondary | ICD-10-CM | POA: Diagnosis not present

## 2022-08-16 DIAGNOSIS — J189 Pneumonia, unspecified organism: Secondary | ICD-10-CM | POA: Diagnosis not present

## 2022-08-16 DIAGNOSIS — D696 Thrombocytopenia, unspecified: Secondary | ICD-10-CM | POA: Diagnosis not present

## 2022-08-16 DIAGNOSIS — I2510 Atherosclerotic heart disease of native coronary artery without angina pectoris: Secondary | ICD-10-CM | POA: Diagnosis not present

## 2022-08-16 DIAGNOSIS — M1A0720 Idiopathic chronic gout, left ankle and foot, without tophus (tophi): Secondary | ICD-10-CM | POA: Diagnosis not present

## 2022-08-16 DIAGNOSIS — I480 Paroxysmal atrial fibrillation: Secondary | ICD-10-CM | POA: Diagnosis not present

## 2022-08-16 DIAGNOSIS — E1169 Type 2 diabetes mellitus with other specified complication: Secondary | ICD-10-CM | POA: Diagnosis not present

## 2022-08-16 DIAGNOSIS — E538 Deficiency of other specified B group vitamins: Secondary | ICD-10-CM | POA: Diagnosis not present

## 2022-08-16 DIAGNOSIS — E782 Mixed hyperlipidemia: Secondary | ICD-10-CM | POA: Diagnosis not present

## 2022-08-16 DIAGNOSIS — N1831 Chronic kidney disease, stage 3a: Secondary | ICD-10-CM | POA: Diagnosis not present

## 2022-08-16 DIAGNOSIS — Z951 Presence of aortocoronary bypass graft: Secondary | ICD-10-CM | POA: Diagnosis not present

## 2022-08-16 DIAGNOSIS — E1122 Type 2 diabetes mellitus with diabetic chronic kidney disease: Secondary | ICD-10-CM | POA: Diagnosis not present

## 2022-08-16 DIAGNOSIS — R7401 Elevation of levels of liver transaminase levels: Secondary | ICD-10-CM | POA: Diagnosis not present

## 2022-08-28 NOTE — Progress Notes (Signed)
Remote pacemaker transmission.   

## 2022-09-04 ENCOUNTER — Ambulatory Visit: Payer: Medicare Other | Attending: Internal Medicine | Admitting: Internal Medicine

## 2022-09-04 ENCOUNTER — Encounter: Payer: Self-pay | Admitting: Internal Medicine

## 2022-09-04 VITALS — BP 132/68 | HR 78 | Ht 64.0 in | Wt 155.0 lb

## 2022-09-04 DIAGNOSIS — I10 Essential (primary) hypertension: Secondary | ICD-10-CM

## 2022-09-04 MED ORDER — APIXABAN 2.5 MG PO TABS: 2.5000 mg | ORAL_TABLET | Freq: Two times a day (BID) | ORAL | 11 refills | Status: DC

## 2022-09-04 NOTE — Patient Instructions (Addendum)
Medication Instructions:   Start Eliquis 2.5 mg Two Times Daily   Stop Taking Aspirin   *If you need a refill on your cardiac medications before your next appointment, please call your pharmacy*   Lab Work: Your physician recommends that you return for lab work in: 1 Month ( BMP, CBC)   If you have labs (blood work) drawn today and your tests are completely normal, you will receive your results only by: MyChart Message (if you have MyChart) OR A paper copy in the mail If you have any lab test that is abnormal or we need to change your treatment, we will call you to review the results.   Testing/Procedures: NONE    Follow-Up: At [facility], you and your health needs are our priority.  As part of our continuing mission to provide you with exceptional heart care, we have created designated Provider Care Teams.  These Care Teams include your primary Cardiologist (physician) and Advanced Practice Providers (APPs -  Physician Assistants and Nurse Practitioners) who all work together to provide you with the care you need, when you need it.  We recommend signing up for the patient portal called "MyChart".  Sign up information is provided on this After Visit Summary.  MyChart is used to connect with patients for Virtual Visits (Telemedicine).  Patients are able to view lab/test results, encounter notes, upcoming appointments, etc.  Non-urgent messages can be sent to your provider as well.   To learn more about what you can do with MyChart, go to [email or web address].    Your next appointment:   1 year(s)  Provider:   Lewayne Bunting, MD    Other Instructions Thank you for choosing [facility]!

## 2022-09-04 NOTE — Progress Notes (Signed)
HPI [name] returns for PM followup. He has a h/o chb and is s/p PPM insertion. He denies chest pain or sob. He has developed chronic systolic heart failure but his symptoms are now class 2. He denies chest pain or sob.  Allergies  Allergen Reactions   Codeine Nausea And Vomiting     Current Outpatient Medications  Medication Sig Dispense Refill   aspirin EC 81 MG tablet Take 1 tablet (81 mg total) by mouth daily. Swallow whole. 90 tablet 3   carvedilol (COREG) 12.5 MG tablet TAKE 1 TABLET(12.5 MG) BY MOUTH TWICE DAILY WITH A MEAL 180 tablet 3   docusate sodium (COLACE) 100 MG capsule Take 100 mg by mouth 2 (two) times daily.     ferrous sulfate 325 (65 FE) MG EC tablet Take 325 mg by mouth 3 (three) times daily with meals.     furosemide (LASIX) 40 MG tablet TAKE 1 TABLET(40 MG) BY MOUTH DAILY (Patient taking differently: Take 20 mg by mouth daily.) 90 tablet 3   guaifenesin (ROBITUSSIN) 100 MG/5ML syrup Take 200 mg by mouth 3 (three) times daily as needed for cough.     insulin degludec (TRESIBA FLEXTOUCH) 100 UNIT/ML FlexTouch Pen Inject 10 Units into the skin daily.     Ipratropium-Albuterol (COMBIVENT) 20-100 MCG/ACT AERS respimat Inhale 1 puff into the lungs 2 (two) times daily. 1 each 0   nitroGLYCERIN (NITROSTAT) 0.4 MG SL tablet Place 1 tablet (0.4 mg total) under the tongue every 5 (five) minutes as needed. 25 tablet 3   potassium chloride (KLOR-CON M) 10 MEQ tablet Take 10 mEq by mouth 2 (two) times daily.     predniSONE (DELTASONE) 50 MG tablet Take 1 tablet (50 mg total) by mouth daily with breakfast. X 4 days 4 tablet 0   sacubitril-valsartan (ENTRESTO) 24-26 MG Take 1 tablet by mouth 2 (two) times daily. 60 tablet 11   simvastatin (ZOCOR) 40 MG tablet TAKE 1 TABLET BY MOUTH EVERY DAY IN THE EVENING 90 tablet 2   No current facility-administered medications for this visit.     Past Medical History:  Diagnosis Date   BPH (benign prostatic hypertrophy)    CHF  (congestive heart failure)    a. EF at 45-50% in 2016 b. EF at 40-45% by repeat echo in 12/2020   Chronic systolic heart failure    Complete heart block    Medtronic pacemaker 04/2021 - Dr. Ladona Ridgel   Coronary artery disease    a. Multivessel status post CABG 8/06, LIMA-LAD, SVG-CFX, SVG-distal RCA b. 12/2020: cath showing 3/3 patent grafts with 80% stenosis along LPAV prior to bifurication and medical management recommended.   Essential hypertension    Hyperlipidemia    Ischemic cardiomyopathy    Myelodysplasia, low grade 11/27/2015   Nephrolithiasis    Prostate cancer    Vitamin B 12 deficiency 07/27/2016    ROS:   All systems reviewed and negative except as noted in the HPI.   Past Surgical History:  Procedure Laterality Date   CARDIAC DEFIBRILLATOR PLACEMENT     COLONOSCOPY     COLONOSCOPY N/A 08/19/2013   Procedure: COLONOSCOPY;  Surgeon: Malissa Hippo, MD;  Location: [facility]ITE;  Service: Endoscopy;  Laterality: N/A;  930   CORONARY ARTERY BYPASS GRAFT     8/06 with left anterior descending artery, saphenous vein graft to circumflex, saphenous vein graft to distal right coronary artery   CYSTOSCOPY/URETEROSCOPY/HOLMIUM LASER/STENT PLACEMENT Right 11/07/2020   Procedure:  CYSTOSCOPY/URETEROSCOPY/HOLMIUM LASER/STENT PLACEMENT;  Surgeon: Sebastian Ache, MD;  Location: [facility] ORS;  Service: Urology;  Laterality: Right;   HERNIA REPAIR     LEFT HEART CATH AND CORS/GRAFTS ANGIOGRAPHY N/A 12/19/2020   Procedure: LEFT HEART CATH AND CORS/GRAFTS ANGIOGRAPHY;  Surgeon: Marykay Lex, MD;  Location: [facility] INVASIVE CV LAB;  Service: Cardiovascular;  Laterality: N/A;   PACEMAKER IMPLANT N/A 04/24/2021   Procedure: PACEMAKER IMPLANT;  Surgeon: Marinus Maw, MD;  Location: [facility] INVASIVE CV LAB;  Service: Cardiovascular;  Laterality: N/A;   POLYPECTOMY     PROSTATE SURGERY     TEMPORARY PACEMAKER N/A 04/21/2021   Procedure: TEMPORARY PACEMAKER;  Surgeon: Swaziland, Peter M, MD;  Location: [facility]  INVASIVE CV LAB;  Service: Cardiovascular;  Laterality: N/A;     Family History  Problem Relation Age of Onset   Colon cancer Father    Coronary artery disease Other      Social History   Socioeconomic History   Marital status: Married    Spouse name: Not on file   Number of children: Not on file   Years of education: Not on file   Highest education level: Not on file  Occupational History   Occupation: Retired    Associate Professor: RETIRED  Tobacco Use   Smoking status: Former    Packs/day: 0.25    Years: 44.00    Additional pack years: 0.00    Total pack years: 11.00    Types: Cigarettes    Quit date: 05/14/1992    Years since quitting: 30.3   Smokeless tobacco: Never  Vaping Use   Vaping Use: Never used  Substance and Sexual Activity   Alcohol use: No   Drug use: No   Sexual activity: Not on file    Comment: married 63 years  Other Topics Concern   Not on file  Social History Narrative   Not on file   Social Determinants of Health   Financial Resource Strain: Not on file  Food Insecurity: No Food Insecurity (05/22/2022)   Hunger Vital Sign    Worried About Running Out of Food in the Last Year: Never true    Ran Out of Food in the Last Year: Never true  Transportation Needs: No Transportation Needs (05/22/2022)   PRAPARE - Administrator, Civil Service (Medical): No    Lack of Transportation (Non-Medical): No  Physical Activity: Not on file  Stress: Not on file  Social Connections: Not on file  Intimate Partner Violence: Not At Risk (05/22/2022)   Humiliation, Afraid, Rape, and Kick questionnaire    Fear of Current or Ex-Partner: No    Emotionally Abused: No    Physically Abused: No    Sexually Abused: No     BP 132/68   Pulse 78   Ht  (1.626 m)   Wt 155 lb (70.3 kg)   SpO2 96%   BMI 26.61 kg/m   Physical Exam:  Well appearing NAD HEENT: Unremarkable Neck:  No JVD, no thyromegally Lymphatics:  No adenopathy Back:  No CVA  tenderness Lungs:  Clear with no wheezes HEART:  Regular rate rhythm, no murmurs, no rubs, no clicks Abd:  soft, positive bowel sounds, no organomegally, no rebound, no guarding Ext:  2 plus pulses, no edema, no cyanosis, no clubbing Skin:  No rashes no nodules Neuro:  CN II through XII intact, motor grossly intact  EKG - NSR with atrial pacing  DEVICE  Normal device function.  See PaceArt for  details.   Assess/Plan: New atrial flutter - he's had 90 minutes at a time of fib/flutter and I have recommended he start eliquis and stop ASA. CHB - he is asymptomatic s/p PPM insertion.  HTN -his bp is controlled.  Chronic systolic heart failure - he appears to be euvolemic. No other change in meds.  Sharlot Gowda Luka Reisch,MD

## 2022-09-12 ENCOUNTER — Ambulatory Visit: Payer: Medicare Other | Admitting: Student

## 2022-09-12 DIAGNOSIS — L820 Inflamed seborrheic keratosis: Secondary | ICD-10-CM | POA: Diagnosis not present

## 2022-09-12 DIAGNOSIS — D0439 Carcinoma in situ of skin of other parts of face: Secondary | ICD-10-CM | POA: Diagnosis not present

## 2022-09-18 DIAGNOSIS — M79674 Pain in right toe(s): Secondary | ICD-10-CM | POA: Diagnosis not present

## 2022-09-18 DIAGNOSIS — B351 Tinea unguium: Secondary | ICD-10-CM | POA: Diagnosis not present

## 2022-09-18 DIAGNOSIS — M79675 Pain in left toe(s): Secondary | ICD-10-CM | POA: Diagnosis not present

## 2022-09-20 ENCOUNTER — Telehealth: Payer: Self-pay | Admitting: Internal Medicine

## 2022-09-20 NOTE — Telephone Encounter (Signed)
Labs faxed to [facility]- Attn:Michelle at number provided.

## 2022-09-20 NOTE — Telephone Encounter (Signed)
[name] called stating she has a note stating the last time patient was in to see Dr Ladona Ridgel he wanted patient to have labs done she said they can do them there they just need the lab orders faxed to them at [phone] Attn: [name].

## 2022-09-24 DIAGNOSIS — I5022 Chronic systolic (congestive) heart failure: Secondary | ICD-10-CM | POA: Diagnosis not present

## 2022-09-24 DIAGNOSIS — Z713 Dietary counseling and surveillance: Secondary | ICD-10-CM | POA: Diagnosis not present

## 2022-09-24 DIAGNOSIS — I890 Lymphedema, not elsewhere classified: Secondary | ICD-10-CM | POA: Diagnosis not present

## 2022-09-24 DIAGNOSIS — I2510 Atherosclerotic heart disease of native coronary artery without angina pectoris: Secondary | ICD-10-CM | POA: Diagnosis not present

## 2022-09-24 DIAGNOSIS — Z6829 Body mass index (BMI) 29.0-29.9, adult: Secondary | ICD-10-CM | POA: Diagnosis not present

## 2022-09-24 DIAGNOSIS — Z79899 Other long term (current) drug therapy: Secondary | ICD-10-CM | POA: Diagnosis not present

## 2022-09-24 DIAGNOSIS — I480 Paroxysmal atrial fibrillation: Secondary | ICD-10-CM | POA: Diagnosis not present

## 2022-09-24 DIAGNOSIS — Z951 Presence of aortocoronary bypass graft: Secondary | ICD-10-CM | POA: Diagnosis not present

## 2022-09-24 DIAGNOSIS — R600 Localized edema: Secondary | ICD-10-CM | POA: Diagnosis not present

## 2022-09-24 DIAGNOSIS — N1831 Chronic kidney disease, stage 3a: Secondary | ICD-10-CM | POA: Diagnosis not present

## 2022-09-26 NOTE — Telephone Encounter (Signed)
Spoke to [place] at [facility] and let her know that we have re-faxed lab work

## 2022-09-26 NOTE — Telephone Encounter (Signed)
Marcelino Duster from [facility] called stating she didn't receive the lab orders.  If they can please be refaxed to [phone].

## 2022-09-27 DIAGNOSIS — Z23 Encounter for immunization: Secondary | ICD-10-CM | POA: Diagnosis not present

## 2022-10-03 ENCOUNTER — Other Ambulatory Visit: Payer: Self-pay | Admitting: Internal Medicine

## 2022-10-03 DIAGNOSIS — I10 Essential (primary) hypertension: Secondary | ICD-10-CM | POA: Diagnosis not present

## 2022-10-03 DIAGNOSIS — E538 Deficiency of other specified B group vitamins: Secondary | ICD-10-CM | POA: Diagnosis not present

## 2022-10-04 LAB — CBC/DIFF AMBIGUOUS DEFAULT
Eos: 0 %
Lymphs: 16 %
MCH: 31.7 pg (ref 26.6–33.0)
MCV: 95 fL (ref 79–97)
Monocytes Absolute: 2 10*3/uL — ABNORMAL HIGH (ref 0.1–0.9)
Platelets: 121 10*3/uL — ABNORMAL LOW (ref 150–450)
RBC: 3.97 x10E6/uL — ABNORMAL LOW (ref 4.14–5.80)
RDW: 13.1 % (ref 11.6–15.4)

## 2022-10-04 LAB — BASIC METABOLIC PANEL
BUN/Creatinine Ratio: 20 (ref 10–24)
BUN: 35 mg/dL (ref 10–36)
CO2: 24 mmol/L (ref 20–29)
Chloride: 102 mmol/L (ref 96–106)
Potassium: 4.8 mmol/L (ref 3.5–5.2)

## 2022-10-12 DIAGNOSIS — E278 Other specified disorders of adrenal gland: Secondary | ICD-10-CM | POA: Diagnosis not present

## 2022-10-12 DIAGNOSIS — I129 Hypertensive chronic kidney disease with stage 1 through stage 4 chronic kidney disease, or unspecified chronic kidney disease: Secondary | ICD-10-CM | POA: Diagnosis not present

## 2022-10-12 DIAGNOSIS — D6959 Other secondary thrombocytopenia: Secondary | ICD-10-CM | POA: Diagnosis not present

## 2022-10-12 DIAGNOSIS — I5043 Acute on chronic combined systolic (congestive) and diastolic (congestive) heart failure: Secondary | ICD-10-CM | POA: Diagnosis not present

## 2022-10-12 DIAGNOSIS — R809 Proteinuria, unspecified: Secondary | ICD-10-CM | POA: Diagnosis not present

## 2022-10-12 DIAGNOSIS — E1122 Type 2 diabetes mellitus with diabetic chronic kidney disease: Secondary | ICD-10-CM | POA: Diagnosis not present

## 2022-10-12 DIAGNOSIS — D638 Anemia in other chronic diseases classified elsewhere: Secondary | ICD-10-CM | POA: Diagnosis not present

## 2022-10-12 DIAGNOSIS — E1129 Type 2 diabetes mellitus with other diabetic kidney complication: Secondary | ICD-10-CM | POA: Diagnosis not present

## 2022-10-12 DIAGNOSIS — Z7689 Persons encountering health services in other specified circumstances: Secondary | ICD-10-CM | POA: Diagnosis not present

## 2022-10-12 DIAGNOSIS — D469 Myelodysplastic syndrome, unspecified: Secondary | ICD-10-CM | POA: Diagnosis not present

## 2022-10-12 DIAGNOSIS — I5042 Chronic combined systolic (congestive) and diastolic (congestive) heart failure: Secondary | ICD-10-CM | POA: Diagnosis not present

## 2022-10-12 DIAGNOSIS — N189 Chronic kidney disease, unspecified: Secondary | ICD-10-CM | POA: Diagnosis not present

## 2022-10-15 LAB — CBC/DIFF AMBIGUOUS DEFAULT
Basophils Absolute: 0 10*3/uL (ref 0.0–0.2)
Basos: 0 %
EOS (ABSOLUTE): 0 10*3/uL (ref 0.0–0.4)
Hematocrit: 37.7 % (ref 37.5–51.0)
Hemoglobin: 12.6 g/dL — ABNORMAL LOW (ref 13.0–17.7)
Immature Grans (Abs): 0 10*3/uL (ref 0.0–0.1)
Immature Granulocytes: 1 %
Lymphocytes Absolute: 0.8 10*3/uL (ref 0.7–3.1)
MCHC: 33.4 g/dL (ref 31.5–35.7)
Monocytes: 38 %
Neutrophils Absolute: 2.3 10*3/uL (ref 1.4–7.0)
Neutrophils: 45 %
WBC: 5.1 10*3/uL (ref 3.4–10.8)

## 2022-10-15 LAB — SPECIMEN STATUS REPORT

## 2022-10-15 LAB — BASIC METABOLIC PANEL
Calcium: 9.2 mg/dL (ref 8.6–10.2)
Creatinine, Ser: 1.71 mg/dL — ABNORMAL HIGH (ref 0.76–1.27)
Glucose: 123 mg/dL — ABNORMAL HIGH (ref 70–99)
Sodium: 140 mmol/L (ref 134–144)
eGFR: 37 mL/min/{1.73_m2} — ABNORMAL LOW (ref >59)

## 2022-10-17 DIAGNOSIS — N1832 Chronic kidney disease, stage 3b: Secondary | ICD-10-CM | POA: Diagnosis not present

## 2022-10-17 DIAGNOSIS — I10 Essential (primary) hypertension: Secondary | ICD-10-CM | POA: Diagnosis not present

## 2022-10-17 DIAGNOSIS — E1122 Type 2 diabetes mellitus with diabetic chronic kidney disease: Secondary | ICD-10-CM | POA: Diagnosis not present

## 2022-10-17 DIAGNOSIS — R809 Proteinuria, unspecified: Secondary | ICD-10-CM | POA: Diagnosis not present

## 2022-10-24 DIAGNOSIS — L570 Actinic keratosis: Secondary | ICD-10-CM | POA: Diagnosis not present

## 2022-10-24 DIAGNOSIS — Z85828 Personal history of other malignant neoplasm of skin: Secondary | ICD-10-CM | POA: Diagnosis not present

## 2022-10-24 DIAGNOSIS — Z08 Encounter for follow-up examination after completed treatment for malignant neoplasm: Secondary | ICD-10-CM | POA: Diagnosis not present

## 2022-10-24 DIAGNOSIS — X32XXXD Exposure to sunlight, subsequent encounter: Secondary | ICD-10-CM | POA: Diagnosis not present

## 2022-10-25 ENCOUNTER — Ambulatory Visit (INDEPENDENT_AMBULATORY_CARE_PROVIDER_SITE_OTHER): Payer: Medicare Other

## 2022-10-25 DIAGNOSIS — I442 Atrioventricular block, complete: Secondary | ICD-10-CM | POA: Diagnosis not present

## 2022-10-25 LAB — CUP PACEART REMOTE DEVICE CHECK
Battery Remaining Longevity: 109 mo
Battery Voltage: 3.02 V
Brady Statistic AP VP Percent: 23 %
Brady Statistic AP VS Percent: 0 %
Brady Statistic AS VP Percent: 75.17 %
Brady Statistic AS VS Percent: 1.82 %
Brady Statistic RA Percent Paced: 24.14 %
Brady Statistic RV Percent Paced: 98.17 %
Date Time Interrogation Session: 20240613062603
Implantable Lead Connection Status: 753985
Implantable Lead Connection Status: 753985
Implantable Lead Implant Date: 20071018
Implantable Lead Implant Date: 20221212
Implantable Lead Location: 753859
Implantable Lead Location: 753860
Implantable Lead Model: 5076
Implantable Lead Model: 6947
Implantable Pulse Generator Implant Date: 20221212
Lead Channel Impedance Value: 247 Ohm
Lead Channel Impedance Value: 285 Ohm
Lead Channel Impedance Value: 361 Ohm
Lead Channel Impedance Value: 361 Ohm
Lead Channel Pacing Threshold Amplitude: 0.5 V
Lead Channel Pacing Threshold Amplitude: 0.625 V
Lead Channel Pacing Threshold Pulse Width: 0.4 ms
Lead Channel Pacing Threshold Pulse Width: 0.4 ms
Lead Channel Sensing Intrinsic Amplitude: 0.375 mV
Lead Channel Sensing Intrinsic Amplitude: 0.375 mV
Lead Channel Sensing Intrinsic Amplitude: 10.375 mV
Lead Channel Sensing Intrinsic Amplitude: 10.375 mV
Lead Channel Setting Pacing Amplitude: 2 V
Lead Channel Setting Pacing Amplitude: 2.5 V
Lead Channel Setting Pacing Pulse Width: 0.4 ms
Lead Channel Setting Sensing Sensitivity: 5.6 mV
Zone Setting Status: 755011
Zone Setting Status: 755011

## 2022-11-05 DIAGNOSIS — E538 Deficiency of other specified B group vitamins: Secondary | ICD-10-CM | POA: Diagnosis not present

## 2022-11-11 ENCOUNTER — Other Ambulatory Visit: Payer: Self-pay

## 2022-11-11 ENCOUNTER — Emergency Department (HOSPITAL_COMMUNITY): Payer: Medicare Other

## 2022-11-11 ENCOUNTER — Encounter (HOSPITAL_COMMUNITY): Payer: Self-pay

## 2022-11-11 ENCOUNTER — Emergency Department (HOSPITAL_COMMUNITY)
Admission: EM | Admit: 2022-11-11 | Discharge: 2022-11-11 | Disposition: A | Discharge status: Skilled Nursing Facility | Payer: Medicare Other | Attending: Emergency Medicine | Admitting: Emergency Medicine

## 2022-11-11 DIAGNOSIS — S0990XA Unspecified injury of head, initial encounter: Secondary | ICD-10-CM | POA: Diagnosis not present

## 2022-11-11 DIAGNOSIS — Z7901 Long term (current) use of anticoagulants: Secondary | ICD-10-CM | POA: Insufficient documentation

## 2022-11-11 DIAGNOSIS — W01198A Fall on same level from slipping, tripping and stumbling with subsequent striking against other object, initial encounter: Secondary | ICD-10-CM | POA: Insufficient documentation

## 2022-11-11 DIAGNOSIS — F0390 Unspecified dementia without behavioral disturbance: Secondary | ICD-10-CM | POA: Insufficient documentation

## 2022-11-11 DIAGNOSIS — M4856XA Collapsed vertebra, not elsewhere classified, lumbar region, initial encounter for fracture: Secondary | ICD-10-CM | POA: Diagnosis not present

## 2022-11-11 DIAGNOSIS — M549 Dorsalgia, unspecified: Secondary | ICD-10-CM | POA: Diagnosis not present

## 2022-11-11 DIAGNOSIS — I6782 Cerebral ischemia: Secondary | ICD-10-CM | POA: Diagnosis not present

## 2022-11-11 DIAGNOSIS — M5030 Other cervical disc degeneration, unspecified cervical region: Secondary | ICD-10-CM | POA: Diagnosis not present

## 2022-11-11 DIAGNOSIS — W19XXXA Unspecified fall, initial encounter: Secondary | ICD-10-CM | POA: Diagnosis not present

## 2022-11-11 DIAGNOSIS — M4802 Spinal stenosis, cervical region: Secondary | ICD-10-CM | POA: Diagnosis not present

## 2022-11-11 NOTE — Discharge Instructions (Signed)
Follow-up with your doctor if any problem 

## 2022-11-11 NOTE — ED Provider Notes (Signed)
[place] EMERGENCY DEPARTMENT AT [facility] Provider Note   CSN: [id number] Arrival date & time: 11/11/22  4098     History {Add pertinent medical, surgical, social history, OB history to HPI:1} Chief Complaint  Patient presents with   [name] is a 89 y.o. male.  Patient has a history of dementia.  He fell and hit his head   Fall       Home Medications Prior to Admission medications   Medication Sig Start Date End Date Taking? Authorizing Provider  apixaban (ELIQUIS) 2.5 MG TABS tablet Take 1 tablet (2.5 mg total) by mouth 2 (two) times daily. 09/04/22   Marinus Maw, MD  carvedilol (COREG) 12.5 MG tablet TAKE 1 TABLET(12.5 MG) BY MOUTH TWICE DAILY WITH A MEAL 04/09/22   Jonelle Sidle, MD  docusate sodium (COLACE) 100 MG capsule Take 100 mg by mouth 2 (two) times daily.    [provider]  ferrous sulfate 325 (65 FE) MG EC tablet Take 325 mg by mouth 3 (three) times daily with meals.    [provider]  furosemide (LASIX) 40 MG tablet TAKE 1 TABLET(40 MG) BY MOUTH DAILY Patient taking differently: Take 20 mg by mouth daily. 07/28/21   Jonelle Sidle, MD  guaifenesin (ROBITUSSIN) 100 MG/5ML syrup Take 200 mg by mouth 3 (three) times daily as needed for cough.    [provider]  insulin degludec (TRESIBA FLEXTOUCH) 100 UNIT/ML FlexTouch Pen Inject 10 Units into the skin daily.    [provider]  Ipratropium-Albuterol (COMBIVENT) 20-100 MCG/ACT AERS respimat Inhale 1 puff into the lungs 2 (two) times daily. 05/25/22   Catarina Hartshorn, MD  nitroGLYCERIN (NITROSTAT) 0.4 MG SL tablet Place 1 tablet (0.4 mg total) under the tongue every 5 (five) minutes as needed. 06/08/19   Dyann Kief, PA-C  potassium chloride (KLOR-CON M) 10 MEQ tablet Take 10 mEq by mouth 2 (two) times daily.    [provider]  predniSONE (DELTASONE) 50 MG tablet Take 1 tablet (50 mg total) by mouth daily with breakfast. X 4 days  05/26/22   Tat, Onalee Hua, MD  sacubitril-valsartan (ENTRESTO) 24-26 MG Take 1 tablet by mouth 2 (two) times daily. 07/04/22   Jonelle Sidle, MD  simvastatin (ZOCOR) 40 MG tablet TAKE 1 TABLET BY MOUTH EVERY DAY IN THE EVENING 05/22/22   Jonelle Sidle, MD      Allergies    Codeine    Review of Systems   Review of Systems  Physical Exam Updated Vital Signs BP (!) 142/71 (BP Location: Right Arm)   Pulse 71   Temp 97.8 F (36.6 C) (Oral)   Resp 19   Ht 5\' 6"  (1.676 m)   Wt 70 kg Comment: according to stretcher  SpO2 95%   BMI 24.90 kg/m  Physical Exam  ED Results / Procedures / Treatments   Labs (all labs ordered are listed, but only abnormal results are displayed) Labs Reviewed - No data to display  EKG None  Radiology CT Cervical Spine Wo Contrast  Result Date: 11/11/2022 CLINICAL DATA:  89 year old male status post fall striking head. Not on blood thinners. EXAM: CT CERVICAL SPINE WITHOUT CONTRAST TECHNIQUE: Multidetector CT imaging of the cervical spine was performed without intravenous contrast. Multiplanar CT image reconstructions were also generated. RADIATION DOSE REDUCTION: This exam was performed according to the departmental dose-optimization program which includes automated exposure control, adjustment of the mA and/or kV according  to patient size and/or use of iterative reconstruction technique. COMPARISON:  Head CT today. FINDINGS: Alignment: Mildly exaggerated cervical lordosis. Cervicothoracic junction alignment is within normal limits. Bilateral posterior element alignment is within normal limits. Skull base and vertebrae: Osteopenia. Visualized skull base is intact. No atlanto-occipital dissociation. C1 and C2 appear intact and aligned. Imaging was repeated at 0924 hours and is relatively motion free at that time. No acute osseous abnormality identified. Soft tissues and spinal canal: No prevertebral fluid or swelling. No visible canal hematoma. Calcified carotid  atherosclerosis in the neck. Otherwise negative visible noncontrast neck soft tissues. Disc levels: Age-appropriate cervical spine degeneration. Up to mild associated cervical spinal stenosis. Upper chest: Left chest cardiac pacemaker. Upper lungs are clear. Chronic compression fracture of T2 with sclerosis (sagittal image 36) is stable from 05/22/2022 chest CTA. IMPRESSION: 1. No acute traumatic injury identified in the cervical spine. 2. Chronic T2 compression fracture is stable since January. Electronically Signed   By: Odessa Fleming M.D.   On: 11/11/2022 09:52   CT Head Wo Contrast  Result Date: 11/11/2022 CLINICAL DATA:  89 year old male status post fall striking head. Not on blood thinners. EXAM: CT HEAD WITHOUT CONTRAST TECHNIQUE: Contiguous axial images were obtained from the base of the skull through the vertex without intravenous contrast. RADIATION DOSE REDUCTION: This exam was performed according to the departmental dose-optimization program which includes automated exposure control, adjustment of the mA and/or kV according to patient size and/or use of iterative reconstruction technique. COMPARISON:  Head CT 10/31/2018. FINDINGS: Brain: Cerebral volume loss since 2020 has progressed but appears generalized. No midline shift, ventriculomegaly, mass effect, evidence of mass lesion, intracranial hemorrhage or evidence of cortically based acute infarction. Patchy chronic bilateral white matter hypodensity and left thalamic, right basal ganglia chronic lacunar infarcts. Small chronic infarct in the left cerebellar hemisphere. Vascular: Calcified atherosclerosis at the skull base. No suspicious intracranial vascular hyperdensity. Skull: No acute osseous abnormality identified. Sinuses/Orbits: Paranasal sinus mucosal thickening is unchanged since 2020. Tympanic cavities and mastoids remain clear. Other: Mild if any forehead scalp hematoma/contusion. Orbits soft tissues appears stable and intact. IMPRESSION: 1.  No acute intracranial abnormality or acute traumatic injury identified. 2. Chronic small vessel ischemia and generalized cerebral volume loss. Electronically Signed   By: Odessa Fleming M.D.   On: 11/11/2022 09:49    Procedures Procedures  {Document cardiac monitor, telemetry assessment procedure when appropriate:1}  Medications Ordered in ED Medications - No data to display  ED Course/ Medical Decision Making/ A&P   {   Click here for ABCD2, HEART and other calculatorsREFRESH Note before signing :1}                          Medical Decision Making Amount and/or Complexity of Data Reviewed Radiology: ordered.  Patient with fall mild head injury.  CT scan negative  {Document critical care time when appropriate:1} {Document review of labs and clinical decision tools ie heart score, Chads2Vasc2 etc:1}  {Document your independent review of radiology images, and any outside records:1} {Document your discussion with family members, caretakers, and with consultants:1} {Document social determinants of health affecting pt's care:1} {Document your decision making why or why not admission, treatments were needed:1} Final Clinical Impression(s) / ED Diagnoses Final diagnoses:  Fall, initial encounter    Rx / DC Orders ED Discharge Orders     None

## 2022-11-11 NOTE — ED Triage Notes (Signed)
[facility] for fall. Pt not on blood thinners. Vs WNL, per EMS, pt hit his head with fall. Pt states no pain. Calling facility for more information as pt does not remember falling. Noted swelling of right hand upon assessment

## 2022-11-11 NOTE — ED Notes (Addendum)
Spoke with Elmarie Shiley, SIC (supervisor in Charge) @ [facility] @ [phone]. Stated that pt was found on the floor this am with an unwitnessed fall. Stated pt has only been c/o pain in back but not head or from fall. Pt not c/o pain now either. [phone]

## 2022-11-14 NOTE — Progress Notes (Signed)
Remote pacemaker transmission.   

## 2022-11-22 DIAGNOSIS — M1A0720 Idiopathic chronic gout, left ankle and foot, without tophus (tophi): Secondary | ICD-10-CM | POA: Diagnosis not present

## 2022-11-22 DIAGNOSIS — E1169 Type 2 diabetes mellitus with other specified complication: Secondary | ICD-10-CM | POA: Diagnosis not present

## 2022-11-28 DIAGNOSIS — I130 Hypertensive heart and chronic kidney disease with heart failure and stage 1 through stage 4 chronic kidney disease, or unspecified chronic kidney disease: Secondary | ICD-10-CM | POA: Diagnosis not present

## 2022-11-28 DIAGNOSIS — I890 Lymphedema, not elsewhere classified: Secondary | ICD-10-CM | POA: Diagnosis not present

## 2022-11-28 DIAGNOSIS — J9601 Acute respiratory failure with hypoxia: Secondary | ICD-10-CM | POA: Diagnosis not present

## 2022-11-28 DIAGNOSIS — E1122 Type 2 diabetes mellitus with diabetic chronic kidney disease: Secondary | ICD-10-CM | POA: Diagnosis not present

## 2022-11-28 DIAGNOSIS — E1169 Type 2 diabetes mellitus with other specified complication: Secondary | ICD-10-CM | POA: Diagnosis not present

## 2022-11-28 DIAGNOSIS — I2510 Atherosclerotic heart disease of native coronary artery without angina pectoris: Secondary | ICD-10-CM | POA: Diagnosis not present

## 2022-11-28 DIAGNOSIS — Z951 Presence of aortocoronary bypass graft: Secondary | ICD-10-CM | POA: Diagnosis not present

## 2022-11-28 DIAGNOSIS — I480 Paroxysmal atrial fibrillation: Secondary | ICD-10-CM | POA: Diagnosis not present

## 2022-11-28 DIAGNOSIS — I5022 Chronic systolic (congestive) heart failure: Secondary | ICD-10-CM | POA: Diagnosis not present

## 2022-11-28 DIAGNOSIS — R600 Localized edema: Secondary | ICD-10-CM | POA: Diagnosis not present

## 2022-11-28 DIAGNOSIS — N1831 Chronic kidney disease, stage 3a: Secondary | ICD-10-CM | POA: Diagnosis not present

## 2022-11-28 DIAGNOSIS — S40021A Contusion of right upper arm, initial encounter: Secondary | ICD-10-CM | POA: Diagnosis not present

## 2022-11-29 ENCOUNTER — Encounter: Payer: Self-pay | Admitting: Internal Medicine

## 2022-11-30 DIAGNOSIS — I480 Paroxysmal atrial fibrillation: Secondary | ICD-10-CM | POA: Diagnosis not present

## 2022-11-30 DIAGNOSIS — Z794 Long term (current) use of insulin: Secondary | ICD-10-CM | POA: Diagnosis not present

## 2022-11-30 DIAGNOSIS — I5022 Chronic systolic (congestive) heart failure: Secondary | ICD-10-CM | POA: Diagnosis not present

## 2022-11-30 DIAGNOSIS — Z87891 Personal history of nicotine dependence: Secondary | ICD-10-CM | POA: Diagnosis not present

## 2022-11-30 DIAGNOSIS — Z8616 Personal history of COVID-19: Secondary | ICD-10-CM | POA: Diagnosis not present

## 2022-11-30 DIAGNOSIS — E1122 Type 2 diabetes mellitus with diabetic chronic kidney disease: Secondary | ICD-10-CM | POA: Diagnosis not present

## 2022-11-30 DIAGNOSIS — I130 Hypertensive heart and chronic kidney disease with heart failure and stage 1 through stage 4 chronic kidney disease, or unspecified chronic kidney disease: Secondary | ICD-10-CM | POA: Diagnosis not present

## 2022-11-30 DIAGNOSIS — Z7901 Long term (current) use of anticoagulants: Secondary | ICD-10-CM | POA: Diagnosis not present

## 2022-11-30 DIAGNOSIS — E538 Deficiency of other specified B group vitamins: Secondary | ICD-10-CM | POA: Diagnosis not present

## 2022-11-30 DIAGNOSIS — I2510 Atherosclerotic heart disease of native coronary artery without angina pectoris: Secondary | ICD-10-CM | POA: Diagnosis not present

## 2022-11-30 DIAGNOSIS — M109 Gout, unspecified: Secondary | ICD-10-CM | POA: Diagnosis not present

## 2022-11-30 DIAGNOSIS — N202 Calculus of kidney with calculus of ureter: Secondary | ICD-10-CM | POA: Diagnosis not present

## 2022-11-30 DIAGNOSIS — N1831 Chronic kidney disease, stage 3a: Secondary | ICD-10-CM | POA: Diagnosis not present

## 2022-11-30 DIAGNOSIS — Z9181 History of falling: Secondary | ICD-10-CM | POA: Diagnosis not present

## 2022-11-30 DIAGNOSIS — F02B0 Dementia in other diseases classified elsewhere, moderate, without behavioral disturbance, psychotic disturbance, mood disturbance, and anxiety: Secondary | ICD-10-CM | POA: Diagnosis not present

## 2022-12-03 DIAGNOSIS — I2510 Atherosclerotic heart disease of native coronary artery without angina pectoris: Secondary | ICD-10-CM | POA: Diagnosis not present

## 2022-12-03 DIAGNOSIS — N1831 Chronic kidney disease, stage 3a: Secondary | ICD-10-CM | POA: Diagnosis not present

## 2022-12-03 DIAGNOSIS — E1122 Type 2 diabetes mellitus with diabetic chronic kidney disease: Secondary | ICD-10-CM | POA: Diagnosis not present

## 2022-12-03 DIAGNOSIS — I130 Hypertensive heart and chronic kidney disease with heart failure and stage 1 through stage 4 chronic kidney disease, or unspecified chronic kidney disease: Secondary | ICD-10-CM | POA: Diagnosis not present

## 2022-12-03 DIAGNOSIS — I5022 Chronic systolic (congestive) heart failure: Secondary | ICD-10-CM | POA: Diagnosis not present

## 2022-12-03 DIAGNOSIS — N202 Calculus of kidney with calculus of ureter: Secondary | ICD-10-CM | POA: Diagnosis not present

## 2022-12-04 DIAGNOSIS — E538 Deficiency of other specified B group vitamins: Secondary | ICD-10-CM | POA: Diagnosis not present

## 2022-12-04 DIAGNOSIS — B351 Tinea unguium: Secondary | ICD-10-CM | POA: Diagnosis not present

## 2022-12-04 DIAGNOSIS — M79675 Pain in left toe(s): Secondary | ICD-10-CM | POA: Diagnosis not present

## 2022-12-04 DIAGNOSIS — M79674 Pain in right toe(s): Secondary | ICD-10-CM | POA: Diagnosis not present

## 2022-12-05 DIAGNOSIS — N1831 Chronic kidney disease, stage 3a: Secondary | ICD-10-CM | POA: Diagnosis not present

## 2022-12-05 DIAGNOSIS — E1122 Type 2 diabetes mellitus with diabetic chronic kidney disease: Secondary | ICD-10-CM | POA: Diagnosis not present

## 2022-12-05 DIAGNOSIS — I130 Hypertensive heart and chronic kidney disease with heart failure and stage 1 through stage 4 chronic kidney disease, or unspecified chronic kidney disease: Secondary | ICD-10-CM | POA: Diagnosis not present

## 2022-12-05 DIAGNOSIS — N202 Calculus of kidney with calculus of ureter: Secondary | ICD-10-CM | POA: Diagnosis not present

## 2022-12-05 DIAGNOSIS — I2510 Atherosclerotic heart disease of native coronary artery without angina pectoris: Secondary | ICD-10-CM | POA: Diagnosis not present

## 2022-12-05 DIAGNOSIS — I5022 Chronic systolic (congestive) heart failure: Secondary | ICD-10-CM | POA: Diagnosis not present

## 2022-12-10 DIAGNOSIS — N202 Calculus of kidney with calculus of ureter: Secondary | ICD-10-CM | POA: Diagnosis not present

## 2022-12-10 DIAGNOSIS — I130 Hypertensive heart and chronic kidney disease with heart failure and stage 1 through stage 4 chronic kidney disease, or unspecified chronic kidney disease: Secondary | ICD-10-CM | POA: Diagnosis not present

## 2022-12-10 DIAGNOSIS — N1831 Chronic kidney disease, stage 3a: Secondary | ICD-10-CM | POA: Diagnosis not present

## 2022-12-10 DIAGNOSIS — E1122 Type 2 diabetes mellitus with diabetic chronic kidney disease: Secondary | ICD-10-CM | POA: Diagnosis not present

## 2022-12-10 DIAGNOSIS — I5022 Chronic systolic (congestive) heart failure: Secondary | ICD-10-CM | POA: Diagnosis not present

## 2022-12-10 DIAGNOSIS — I2510 Atherosclerotic heart disease of native coronary artery without angina pectoris: Secondary | ICD-10-CM | POA: Diagnosis not present

## 2022-12-13 ENCOUNTER — Inpatient Hospital Stay: Payer: Medicare Other | Attending: Hematology

## 2022-12-13 ENCOUNTER — Ambulatory Visit (HOSPITAL_COMMUNITY)
Admission: RE | Admit: 2022-12-13 | Discharge: 2022-12-13 | Disposition: A | Discharge status: Home / Self Care | Payer: Medicare Other | Source: Ambulatory Visit | Attending: Hematology | Admitting: Hematology

## 2022-12-13 DIAGNOSIS — I2510 Atherosclerotic heart disease of native coronary artery without angina pectoris: Secondary | ICD-10-CM | POA: Diagnosis not present

## 2022-12-13 DIAGNOSIS — C7490 Malignant neoplasm of unspecified part of unspecified adrenal gland: Secondary | ICD-10-CM | POA: Diagnosis not present

## 2022-12-13 DIAGNOSIS — N289 Disorder of kidney and ureter, unspecified: Secondary | ICD-10-CM | POA: Diagnosis not present

## 2022-12-13 DIAGNOSIS — E611 Iron deficiency: Secondary | ICD-10-CM

## 2022-12-13 DIAGNOSIS — C7492 Malignant neoplasm of unspecified part of left adrenal gland: Secondary | ICD-10-CM | POA: Insufficient documentation

## 2022-12-13 DIAGNOSIS — D4620 Refractory anemia with excess of blasts, unspecified: Secondary | ICD-10-CM | POA: Insufficient documentation

## 2022-12-13 DIAGNOSIS — E279 Disorder of adrenal gland, unspecified: Secondary | ICD-10-CM | POA: Insufficient documentation

## 2022-12-13 DIAGNOSIS — I130 Hypertensive heart and chronic kidney disease with heart failure and stage 1 through stage 4 chronic kidney disease, or unspecified chronic kidney disease: Secondary | ICD-10-CM | POA: Diagnosis not present

## 2022-12-13 DIAGNOSIS — I5022 Chronic systolic (congestive) heart failure: Secondary | ICD-10-CM | POA: Diagnosis not present

## 2022-12-13 DIAGNOSIS — E1122 Type 2 diabetes mellitus with diabetic chronic kidney disease: Secondary | ICD-10-CM | POA: Diagnosis not present

## 2022-12-13 DIAGNOSIS — E278 Other specified disorders of adrenal gland: Secondary | ICD-10-CM

## 2022-12-13 DIAGNOSIS — K8689 Other specified diseases of pancreas: Secondary | ICD-10-CM | POA: Diagnosis not present

## 2022-12-13 DIAGNOSIS — N202 Calculus of kidney with calculus of ureter: Secondary | ICD-10-CM | POA: Diagnosis not present

## 2022-12-13 DIAGNOSIS — N281 Cyst of kidney, acquired: Secondary | ICD-10-CM | POA: Diagnosis not present

## 2022-12-13 DIAGNOSIS — N1831 Chronic kidney disease, stage 3a: Secondary | ICD-10-CM | POA: Diagnosis not present

## 2022-12-13 DIAGNOSIS — D649 Anemia, unspecified: Secondary | ICD-10-CM

## 2022-12-13 LAB — COMPREHENSIVE METABOLIC PANEL
ALT: 10 U/L (ref 0–44)
AST: 10 U/L — ABNORMAL LOW (ref 15–41)
Albumin: 3.5 g/dL (ref 3.5–5.0)
Alkaline Phosphatase: 44 U/L (ref 38–126)
Anion gap: 9 (ref 5–15)
BUN: 36 mg/dL — ABNORMAL HIGH (ref 8–23)
CO2: 27 mmol/L (ref 22–32)
Calcium: 8.5 mg/dL — ABNORMAL LOW (ref 8.9–10.3)
Chloride: 100 mmol/L (ref 98–111)
Creatinine, Ser: 1.76 mg/dL — ABNORMAL HIGH (ref 0.61–1.24)
GFR, Estimated: 35 mL/min — ABNORMAL LOW (ref >60)
Glucose, Bld: 215 mg/dL — ABNORMAL HIGH (ref 70–99)
Potassium: 4.4 mmol/L (ref 3.5–5.1)
Sodium: 136 mmol/L (ref 135–145)
Total Bilirubin: 0.6 mg/dL (ref 0.3–1.2)
Total Protein: 6.4 g/dL — ABNORMAL LOW (ref 6.5–8.1)

## 2022-12-13 LAB — CBC WITH DIFFERENTIAL/PLATELET
Abs Immature Granulocytes: 0.02 10*3/uL (ref 0.00–0.07)
Basophils Absolute: 0 10*3/uL (ref 0.0–0.1)
Basophils Relative: 0 %
Eosinophils Absolute: 0 10*3/uL (ref 0.0–0.5)
Eosinophils Relative: 0 %
HCT: 39.8 % (ref 39.0–52.0)
Hemoglobin: 13 g/dL (ref 13.0–17.0)
Immature Granulocytes: 1 %
Lymphocytes Relative: 16 %
Lymphs Abs: 0.7 10*3/uL (ref 0.7–4.0)
MCH: 31.7 pg (ref 26.0–34.0)
MCHC: 32.7 g/dL (ref 30.0–36.0)
MCV: 97.1 fL (ref 80.0–100.0)
Monocytes Absolute: 1.7 10*3/uL — ABNORMAL HIGH (ref 0.1–1.0)
Monocytes Relative: 38 %
Neutro Abs: 2 10*3/uL (ref 1.7–7.7)
Neutrophils Relative %: 45 %
Platelets: 149 10*3/uL — ABNORMAL LOW (ref 150–400)
RBC: 4.1 MIL/uL — ABNORMAL LOW (ref 4.22–5.81)
RDW: 13.7 % (ref 11.5–15.5)
WBC: 4.4 10*3/uL (ref 4.0–10.5)
nRBC: 0 % (ref 0.0–0.2)

## 2022-12-13 LAB — IRON AND TIBC
Iron: 78 ug/dL (ref 45–182)
Saturation Ratios: 27 % (ref 17.9–39.5)
TIBC: 295 ug/dL (ref 250–450)
UIBC: 217 ug/dL

## 2022-12-13 LAB — FERRITIN: Ferritin: 46 ng/mL (ref 24–336)

## 2022-12-13 LAB — LACTATE DEHYDROGENASE: LDH: 196 U/L — ABNORMAL HIGH (ref 98–192)

## 2022-12-13 MED ORDER — IOHEXOL 300 MG/ML  SOLN
75.0000 mL | Freq: Once | INTRAMUSCULAR | Status: AC | PRN
  Administered 2022-12-13: 75 mL via INTRAVENOUS

## 2022-12-18 DIAGNOSIS — I5022 Chronic systolic (congestive) heart failure: Secondary | ICD-10-CM | POA: Diagnosis not present

## 2022-12-18 DIAGNOSIS — I130 Hypertensive heart and chronic kidney disease with heart failure and stage 1 through stage 4 chronic kidney disease, or unspecified chronic kidney disease: Secondary | ICD-10-CM | POA: Diagnosis not present

## 2022-12-18 DIAGNOSIS — N202 Calculus of kidney with calculus of ureter: Secondary | ICD-10-CM | POA: Diagnosis not present

## 2022-12-18 DIAGNOSIS — N1831 Chronic kidney disease, stage 3a: Secondary | ICD-10-CM | POA: Diagnosis not present

## 2022-12-18 DIAGNOSIS — I2510 Atherosclerotic heart disease of native coronary artery without angina pectoris: Secondary | ICD-10-CM | POA: Diagnosis not present

## 2022-12-18 DIAGNOSIS — E1122 Type 2 diabetes mellitus with diabetic chronic kidney disease: Secondary | ICD-10-CM | POA: Diagnosis not present

## 2022-12-19 NOTE — Progress Notes (Incomplete)
[facility] [address], [place] [address]    Clinic Day:  12/20/2022  Referring physician: Benita Stabile, MD  Patient Care Team: Benita Stabile, MD as PCP - General (Internal Medicine) Jonelle Sidle, MD as PCP - Cardiology (Cardiology) Marinus Maw, MD as PCP - Electrophysiology (Cardiology) Doreatha Massed, MD as Medical Oncologist (Medical Oncology) Therese Sarah, RN as Oncology Nurse Navigator (Oncology)   ASSESSMENT & PLAN:   Assessment:  1.  Left adrenal mass - During hospitalization last summer (June 2022), CT abdomen/pelvis showed 5.5 cm left adrenal mass with imaging features suspicious for adrenal carcinoma or metastases.  Oncological work-up was recommended by radiologist, urologist, and hospitalist team, but results were never relayed to oncology clinic until July 2023 when patient's nephrologist (Dr. Wolfgang Phoenix) brought it to our attention. -PET scan (12/08/2021): Left adrenal mass 8 x 7.7 cm with SUV 5.73.  CT scan previously measured 5.5 x 4.8 cm 1 year ago.  Numerous sclerotic bone lesions mainly in the ribs with no hypermetabolic some.  No evidence of metastatic disease - He denies any cancer in his siblings or parents. - XRT 45 Gray in 10 fractions (01/08/2022 - 01/22/2022)     2.  Low-grade MDS - Bone marrow biopsy on 10/27/2015 showed hypercellular marrow with mild dyserythropoiesis and dismegakaryocytes.  Chromosome analysis was normal.  FISH panel was normal.  Flow cytometry on peripheral blood did not show any monoclonal B cell population. - He has normocytic anemia which is stable between 11 and 12.  - He also has moderate thrombocytopenia which is stable between 80 and 90,000. - Most recent labs (07/24/2021):  Hgb 12.1, WBC 4.0 with ANC 1.5, platelets 133  Plan:  1.  Left adrenal mass: - He is currently a resident at [facility] assisted living facility. - He denies any abdominal pains. - Labs showed normal LFTs.  CBC was  grossly normal with mild thrombocytopenia stable. - CT abdomen on 06/07/2022 shows 7.5 cm left adrenal soft tissue mass with slight decrease in size compared to PET scan from July 2023.  Stable small bilateral pleural effusions. - Recommend follow-up in 6 months with repeat imaging.     2.  Low-grade MDS: - Anemia from MDS, CKD and relative iron deficiency. - Ferritin is 55, percent saturation 22.  Creatinine 1.37.  Hemoglobin 11.2. - Recommend starting iron tablet 3 times weekly with stool softener if needed. - I plan to repeat ferritin and iron panel at next visit.  No orders of the defined types were placed in this encounter.     Alben Deeds Teague,acting as a Neurosurgeon for Doreatha Massed, MD.,have documented all relevant documentation on the behalf of Doreatha Massed, MD,as directed by  Doreatha Massed, MD while in the presence of Doreatha Massed, MD.   ***  Gardiner R Teague   8/7/202410:27 PM  CHIEF COMPLAINT:   Diagnosis: Adrenal mass greater than 4 cm in diameter  Cancer Staging  No matching staging information was found for the patient.    Prior Therapy: Radiation therapy from 01/08/2022 through 01/22/2022, 45 Gray in 10 fractions   Current Therapy:  surveillance   HISTORY OF PRESENT ILLNESS:   Oncology History   No history exists.     INTERVAL HISTORY:   [name] is a 89 y.o. male presenting to clinic today for follow up of adrenal mass. He was last seen by me on 06/14/22.  Since his last visit, he underwent CT A/P on 12/13/22  that found: further decrease in size of left adrenal mass, presumably treatment response; no evidence of abdominal metastatic disease; decrease in tiny left and trace right pleural effusions; and incidental findings of cholelithiasis and gynecomastia.   He presented to the ED on 11/11/22 for a fall. CT of the head found chronic small vessel ischemia and generalized cerebral volume loss with no acute intracranial abnormality of acute  traumatic injury. CT of cervical spine found chronic T2 compression fracture stable since January and no acute traumatic injury in the cervical spine.   Today, he states that he is doing well overall. His appetite level is at ***%. His energy level is at ***%.  PAST MEDICAL HISTORY:   Past Medical History: Past Medical History:  Diagnosis Date   BPH (benign prostatic hypertrophy)    CHF (congestive heart failure) (HCC)    a. EF at 45-50% in 2016 b. EF at 40-45% by repeat echo in 12/2020   Chronic systolic heart failure (HCC)    Complete heart block [facility])    [employer] pacemaker 04/2021 - Dr. Ladona Ridgel   Coronary artery disease    a. Multivessel status post CABG 8/06, LIMA-LAD, SVG-CFX, SVG-distal RCA b. 12/2020: cath showing 3/3 patent grafts with 80% stenosis along LPAV prior to bifurication and medical management recommended.   Essential hypertension    Hyperlipidemia    Ischemic cardiomyopathy    Myelodysplasia, low grade (HCC) 11/27/2015   Nephrolithiasis    Prostate cancer (HCC)    Vitamin B 12 deficiency 07/27/2016    Surgical History: Past Surgical History:  Procedure Laterality Date   CARDIAC DEFIBRILLATOR PLACEMENT     COLONOSCOPY     COLONOSCOPY N/A 08/19/2013   Procedure: COLONOSCOPY;  Surgeon: Malissa Hippo, MD;  Location: [facility] ENDO SUITE;  Service: Endoscopy;  Laterality: N/A;  930   CORONARY ARTERY BYPASS GRAFT     8/06 with left anterior descending artery, saphenous vein graft to circumflex, saphenous vein graft to distal right coronary artery   CYSTOSCOPY/URETEROSCOPY/HOLMIUM LASER/STENT PLACEMENT Right 11/07/2020   Procedure: CYSTOSCOPY/URETEROSCOPY/HOLMIUM LASER/STENT PLACEMENT;  Surgeon: Sebastian Ache, MD;  Location: [facility] ORS;  Service: Urology;  Laterality: Right;   HERNIA REPAIR     LEFT HEART CATH AND CORS/GRAFTS ANGIOGRAPHY N/A 12/19/2020   Procedure: LEFT HEART CATH AND CORS/GRAFTS ANGIOGRAPHY;  Surgeon: Marykay Lex, MD;  Location: [facility] INVASIVE CV LAB;   Service: Cardiovascular;  Laterality: N/A;   PACEMAKER IMPLANT N/A 04/24/2021   Procedure: PACEMAKER IMPLANT;  Surgeon: Marinus Maw, MD;  Location: [facility] INVASIVE CV LAB;  Service: Cardiovascular;  Laterality: N/A;   POLYPECTOMY     PROSTATE SURGERY     TEMPORARY PACEMAKER N/A 04/21/2021   Procedure: TEMPORARY PACEMAKER;  Surgeon: Swaz[place], Peter M, MD;  Location: [facility] INVASIVE CV LAB;  Service: Cardiovascular;  Laterality: N/A;    Social History: Social History   Socioeconomic History   Marital status: Married    Spouse name: Not on file   Number of children: Not on file   Years of education: Not on file   Highest education level: Not on file  Occupational History   Occupation: Retired    Associate Professor: RETIRED  Tobacco Use   Smoking status: Former    Current packs/day: 0.00    Average packs/day: 0.3 packs/day for 44.0 years (11.0 ttl pk-yrs)    Types: Cigarettes    Start date: 05/14/1948    Quit date: 05/14/1992    Years since quitting: 30.6   Smokeless tobacco: Never  Vaping  Use   Vaping status: Never Used  Substance and Sexual Activity   Alcohol use: No   Drug use: No   Sexual activity: Not on file    Comment: married 63 years  Other Topics Concern   Not on file  Social History Narrative   Not on file   Social Determinants of Health   Financial Resource Strain: Not on file  Food Insecurity: No Food Insecurity (05/22/2022)   Hunger Vital Sign    Worried About Running Out of Food in the Last Year: Never true    Ran Out of Food in the Last Year: Never true  Transportation Needs: No Transportation Needs (05/22/2022)   PRAPARE - Administrator, Civil Service (Medical): No    Lack of Transportation (Non-Medical): No  Physical Activity: Not on file  Stress: Not on file  Social Connections: Not on file  Intimate Partner Violence: Not At Risk (05/22/2022)   Humiliation, Afraid, Rape, and Kick questionnaire    Fear of Current or Ex-Partner: No    Emotionally Abused: No     Physically Abused: No    Sexually Abused: No    Family History: Family History  Problem Relation Age of Onset   Colon cancer Father    Coronary artery disease Other     Current Medications:  Current Outpatient Medications:    apixaban (ELIQUIS) 2.5 MG TABS tablet, Take 1 tablet (2.5 mg total) by mouth 2 (two) times daily., Disp: 60 tablet, Rfl: 11   carvedilol (COREG) 12.5 MG tablet, TAKE 1 TABLET(12.5 MG) BY MOUTH TWICE DAILY WITH A MEAL, Disp: 180 tablet, Rfl: 3   docusate sodium (COLACE) 100 MG capsule, Take 100 mg by mouth 2 (two) times daily., Disp: , Rfl:    ferrous sulfate 325 (65 FE) MG EC tablet, Take 325 mg by mouth 3 (three) times daily with meals., Disp: , Rfl:    furosemide (LASIX) 40 MG tablet, TAKE 1 TABLET(40 MG) BY MOUTH DAILY (Patient taking differently: Take 20 mg by mouth daily.), Disp: 90 tablet, Rfl: 3   guaifenesin (ROBITUSSIN) 100 MG/5ML syrup, Take 200 mg by mouth 3 (three) times daily as needed for cough., Disp: , Rfl:    insulin degludec (TRESIBA FLEXTOUCH) 100 UNIT/ML FlexTouch Pen, Inject 10 Units into the skin daily., Disp: , Rfl:    Ipratropium-Albuterol (COMBIVENT) 20-100 MCG/ACT AERS respimat, Inhale 1 puff into the lungs 2 (two) times daily., Disp: 1 each, Rfl: 0   nitroGLYCERIN (NITROSTAT) 0.4 MG SL tablet, Place 1 tablet (0.4 mg total) under the tongue every 5 (five) minutes as needed., Disp: 25 tablet, Rfl: 3   potassium chloride (KLOR-CON M) 10 MEQ tablet, Take 10 mEq by mouth 2 (two) times daily., Disp: , Rfl:    predniSONE (DELTASONE) 50 MG tablet, Take 1 tablet (50 mg total) by mouth daily with breakfast. X 4 days, Disp: 4 tablet, Rfl: 0   sacubitril-valsartan (ENTRESTO) 24-26 MG, Take 1 tablet by mouth 2 (two) times daily., Disp: 60 tablet, Rfl: 11   simvastatin (ZOCOR) 40 MG tablet, TAKE 1 TABLET BY MOUTH EVERY DAY IN THE EVENING, Disp: 90 tablet, Rfl: 2   Allergies: Allergies  Allergen Reactions   Codeine Nausea And Vomiting    REVIEW OF  SYSTEMS:   Review of Systems  Constitutional:  Negative for chills, fatigue and fever.  HENT:   Negative for lump/mass, mouth sores, nosebleeds, sore throat and trouble swallowing.   Eyes:  Negative for eye problems.  Respiratory:  Negative for cough and shortness of breath.   Cardiovascular:  Negative for chest pain, leg swelling and palpitations.  Gastrointestinal:  Negative for abdominal pain, constipation, diarrhea, nausea and vomiting.  Genitourinary:  Negative for bladder incontinence, difficulty urinating, dysuria, frequency, hematuria and nocturia.   Musculoskeletal:  Negative for arthralgias, back pain, flank pain, myalgias and neck pain.  Skin:  Negative for itching and rash.  Neurological:  Negative for dizziness, headaches and numbness.  Hematological:  Does not bruise/bleed easily.  Psychiatric/Behavioral:  Negative for depression, sleep disturbance and suicidal ideas. The patient is not nervous/anxious.   All other systems reviewed and are negative.    VITALS:   There were no vitals taken for this visit.  Wt Readings from Last 3 Encounters:  11/11/22 154 lb 4.8 oz (70 kg)  09/04/22 155 lb (70.3 kg)  07/04/22 143 lb 12.8 oz (65.2 kg)    There is no height or weight on file to calculate BMI.  Performance status (ECOG): {CHL ONC [id number]  PHYSICAL EXAM:   Physical Exam Vitals and nursing note reviewed. Exam conducted with a chaperone present.  Constitutional:      Appearance: Normal appearance.  Cardiovascular:     Rate and Rhythm: Normal rate and regular rhythm.     Pulses: Normal pulses.     Heart sounds: Normal heart sounds.  Pulmonary:     Effort: Pulmonary effort is normal.     Breath sounds: Normal breath sounds.  Abdominal:     Palpations: Abdomen is soft. There is no hepatomegaly, splenomegaly or mass.     Tenderness: There is no abdominal tenderness.  Musculoskeletal:     Right lower leg: No edema.     Left lower leg: No edema.   Lymphadenopathy:     Cervical: No cervical adenopathy.     Right cervical: No superficial, deep or posterior cervical adenopathy.    Left cervical: No superficial, deep or posterior cervical adenopathy.     Upper Body:     Right upper body: No supraclavicular or axillary adenopathy.     Left upper body: No supraclavicular or axillary adenopathy.  Neurological:     General: No focal deficit present.     Mental Status: He is alert and oriented to person, place, and time.  Psychiatric:        Mood and Affect: Mood normal.        Behavior: Behavior normal.     LABS:      Latest Ref Rng & Units 12/13/2022    1:01 PM 10/03/2022    9:30 AM 06/07/2022    1:10 PM  CBC  WBC 4.0 - 10.5 K/uL 4.4  5.1  6.9   Hemoglobin 13.0 - 17.0 g/dL 29.5  62.1  30.8   Hematocrit 39.0 - 52.0 % 39.8  37.7  34.3   Platelets 150 - 400 K/uL 149  121  124       Latest Ref Rng & Units 12/13/2022    1:01 PM 10/03/2022    9:30 AM 06/07/2022    1:10 PM  CMP  Glucose 70 - 99 mg/dL 657  846  962   BUN 8 - 23 mg/dL 36  35  31   Creatinine 0.61 - 1.24 mg/dL 9.52  8.41  3.24   Sodium 135 - 145 mmol/L 136  140  138   Potassium 3.5 - 5.1 mmol/L 4.4  4.8  4.2   Chloride 98 - 111 mmol/L 100  102  101  CO2 22 - 32 mmol/L 27  24  27    Calcium 8.9 - 10.3 mg/dL 8.5  9.2  8.9   Total Protein 6.5 - 8.1 g/dL 6.4   6.1   Total Bilirubin 0.3 - 1.2 mg/dL 0.6   0.8   Alkaline Phos 38 - 126 U/L 44   42   AST 15 - 41 U/L 10   17   ALT 0 - 44 U/L 10   13      No results found for: "CEA1", "CEA" / No results found for: "CEA1", "CEA" Lab Results  Component Value Date   PSA1 0.1 11/08/2020   No results found for: "BJY782" No results found for: "CAN125"  Lab Results  Component Value Date   TOTALPROTELP 6.9 09/30/2015   TOTALPROTELP 6.7 09/30/2015   ALBUMINELP 3.8 09/30/2015   A1GS 0.2 09/30/2015   A2GS 0.8 09/30/2015   BETS 0.8 09/30/2015   GAMS 1.0 09/30/2015   MSPIKE Not Observed 09/30/2015   SPEI Comment  09/30/2015   Lab Results  Component Value Date   TIBC 295 12/13/2022   TIBC 296 06/07/2022   TIBC 283 07/24/2021   FERRITIN 46 12/13/2022   FERRITIN 55 06/07/2022   FERRITIN 99 05/25/2022   IRONPCTSAT 27 12/13/2022   IRONPCTSAT 22 06/07/2022   IRONPCTSAT 15 (L) 07/24/2021   Lab Results  Component Value Date   LDH 196 (H) 12/13/2022   LDH 239 (H) 06/07/2022   LDH 205 (H) 03/07/2022     STUDIES:   CT Abdomen Pelvis W Contrast  Result Date: 12/19/2022 CLINICAL DATA:  Follow-up of adrenal mass. Adrenal carcinoma. Localized. Monitor. * Tracking Code: BO * EXAM: CT ABDOMEN AND PELVIS WITH CONTRAST TECHNIQUE: Multidetector CT imaging of the abdomen and pelvis was performed using the standard protocol following bolus administration of intravenous contrast. RADIATION DOSE REDUCTION: This exam was performed according to the departmental dose-optimization program which includes automated exposure control, adjustment of the mA and/or kV according to patient size and/or use of iterative reconstruction technique. CONTRAST:  75mL OMNIPAQUE IOHEXOL 300 MG/ML  SOLN COMPARISON:  06/07/2022 FINDINGS: Lower chest: Clear lung bases. Small left and trace right pleural fluid, decreased. Cardiomegaly. Incompletely imaged pacer. Median sternotomy with native coronary artery calcification. Hepatobiliary: Normal liver. Gallstones up to 12 mm without acute cholecystitis or biliary duct dilatation. Pancreas: Pancreatic atrophy is within normal variation for age. Spleen: Normal in size, without focal abnormality. Adrenals/Urinary Tract: Normal right adrenal gland. Heterogeneous left adrenal mass measures 6.0 x 5.7 cm on 28/2 versus 7.0 by 6.8 cm when remeasured in a similar fashion on the prior. Bilateral too small to characterize renal lesions. Right renal cysts including at up to 5.5 cm. An upper pole right renal minimally complex cyst of 9 mm . In the absence of clinically indicated signs/symptoms require(s) no  independent follow-up. No hydronephrosis. Normal urinary bladder. Stomach/Bowel: Normal stomach, without wall thickening. Descending duodenal diverticulum. Otherwise normal small bowel. Scattered colonic diverticula. Normal colon and terminal ileum. Vascular/Lymphatic: Advanced aortic and branch vessel atherosclerosis. No abdominopelvic adenopathy. Reproductive: Normal prostate. Other: No significant free fluid. Musculoskeletal: Moderate bilateral gynecomastia. Remote lower right rib fractures. IMPRESSION: 1. Further decrease in size of left adrenal mass, presumably treatment response. If the patient has not undergone interval therapy, could represent resolving hematoma superimposed upon underlying mass. 2. No evidence of abdominal metastatic disease. 3. Decrease in tiny left and trace right pleural effusions. 4. Incidental findings, including: Cholelithiasis. Gynecomastia. Aortic Atherosclerosis (ICD10-I70.0). Electronically Signed  By: Jeronimo Greaves M.D.   On: 12/19/2022 11:46

## 2022-12-20 ENCOUNTER — Inpatient Hospital Stay: Payer: Medicare Other | Admitting: Hematology

## 2022-12-21 DIAGNOSIS — N1831 Chronic kidney disease, stage 3a: Secondary | ICD-10-CM | POA: Diagnosis not present

## 2022-12-21 DIAGNOSIS — I2510 Atherosclerotic heart disease of native coronary artery without angina pectoris: Secondary | ICD-10-CM | POA: Diagnosis not present

## 2022-12-21 DIAGNOSIS — E1122 Type 2 diabetes mellitus with diabetic chronic kidney disease: Secondary | ICD-10-CM | POA: Diagnosis not present

## 2022-12-21 DIAGNOSIS — N202 Calculus of kidney with calculus of ureter: Secondary | ICD-10-CM | POA: Diagnosis not present

## 2022-12-21 DIAGNOSIS — I5022 Chronic systolic (congestive) heart failure: Secondary | ICD-10-CM | POA: Diagnosis not present

## 2022-12-21 DIAGNOSIS — I130 Hypertensive heart and chronic kidney disease with heart failure and stage 1 through stage 4 chronic kidney disease, or unspecified chronic kidney disease: Secondary | ICD-10-CM | POA: Diagnosis not present

## 2022-12-25 NOTE — Progress Notes (Signed)
[facility] [address], [place] [address]    Clinic Day:  12/25/2022  Referring physician: Benita Stabile, MD  Patient Care Team: Benita Stabile, MD as PCP - General (Internal Medicine) Jonelle Sidle, MD as PCP - Cardiology (Cardiology) Marinus Maw, MD as PCP - Electrophysiology (Cardiology) Doreatha Massed, MD as Medical Oncologist (Medical Oncology) Therese Sarah, RN as Oncology Nurse Navigator (Oncology)   ASSESSMENT & PLAN:   Assessment: ***  Plan: ***  No orders of the defined types were placed in this encounter.     [name],acting as a Neurosurgeon for Doreatha Massed, MD.,have documented all relevant documentation on the behalf of Doreatha Massed, MD,as directed by  Doreatha Massed, MD while in the presence of Doreatha Massed, MD.   ***  [name]   8/13/202410:29 PM  CHIEF COMPLAINT:   Diagnosis: Left adrenal mass greater than 4 cm in diameter, iron deficiency, and normocytic anemia  Cancer Staging  No matching staging information was found for the patient.    Prior Therapy: Radiation therapy from 01/08/2022 through 01/22/2022, 45 Gray in 10 fractions   Current Therapy:  surveillance   HISTORY OF PRESENT ILLNESS:   Oncology History   No history exists.     INTERVAL HISTORY:   [name] is a 89 y.o. male presenting to clinic today for follow up of Adrenal mass greater than 4 cm in diameter. He was last seen by me on 06/14/22.  Since his last visit, he underwent CT A/P on 12/13/22 that found: further decrease in size of left adrenal mass, presumably treatment response; no evidence of abdominal metastatic disease; and decrease in tiny left and trace right pleural effusions.  He presented to the ED on 11/11/22 for a fall. Pertinent negatives include no chest pain, no abdominal pain and no headache   Today, he states that he is doing well overall. His appetite level is at ***%. His energy level is  at ***%.  PAST MEDICAL HISTORY:   Past Medical History: Past Medical History:  Diagnosis Date   BPH (benign prostatic hypertrophy)    CHF (congestive heart failure) (HCC)    a. EF at 45-50% in 2016 b. EF at 40-45% by repeat echo in 12/2020   Chronic systolic heart failure (HCC)    Complete heart block [facility])    Medtronic pacemaker 04/2021 - Dr. Ladona Ridgel   Coronary artery disease    a. Multivessel status post CABG 8/06, LIMA-LAD, SVG-CFX, SVG-distal RCA b. 12/2020: cath showing 3/3 patent grafts with 80% stenosis along LPAV prior to bifurication and medical management recommended.   Essential hypertension    Hyperlipidemia    Ischemic cardiomyopathy    Myelodysplasia, low grade (HCC) 11/27/2015   Nephrolithiasis    Prostate cancer (HCC)    Vitamin B 12 deficiency 07/27/2016    Surgical History: Past Surgical History:  Procedure Laterality Date   CARDIAC DEFIBRILLATOR PLACEMENT     COLONOSCOPY     COLONOSCOPY N/A 08/19/2013   Procedure: COLONOSCOPY;  Surgeon: Malissa Hippo, MD;  Location: [facility] ENDO SUITE;  Service: Endoscopy;  Laterality: N/A;  930   CORONARY ARTERY BYPASS GRAFT     8/06 with left anterior descending artery, saphenous vein graft to circumflex, saphenous vein graft to distal right coronary artery   CYSTOSCOPY/URETEROSCOPY/HOLMIUM LASER/STENT PLACEMENT Right 11/07/2020   Procedure: CYSTOSCOPY/URETEROSCOPY/HOLMIUM LASER/STENT PLACEMENT;  Surgeon: Sebastian Ache, MD;  Location: [facility] ORS;  Service: Urology;  Laterality: Right;  HERNIA REPAIR     LEFT HEART CATH AND CORS/GRAFTS ANGIOGRAPHY N/A 12/19/2020   Procedure: LEFT HEART CATH AND CORS/GRAFTS ANGIOGRAPHY;  Surgeon: Marykay Lex, MD;  Location: [facility] INVASIVE CV LAB;  Service: Cardiovascular;  Laterality: N/A;   PACEMAKER IMPLANT N/A 04/24/2021   Procedure: PACEMAKER IMPLANT;  Surgeon: Marinus Maw, MD;  Location: [facility] INVASIVE CV LAB;  Service: Cardiovascular;  Laterality: N/A;   POLYPECTOMY     PROSTATE SURGERY      TEMPORARY PACEMAKER N/A 04/21/2021   Procedure: TEMPORARY PACEMAKER;  Surgeon: Swaz[place], Peter M, MD;  Location: [facility] INVASIVE CV LAB;  Service: Cardiovascular;  Laterality: N/A;    Social History: Social History   Socioeconomic History   Marital status: Married    Spouse name: Not on file   Number of children: Not on file   Years of education: Not on file   Highest education level: Not on file  Occupational History   Occupation: Retired    Associate Professor: RETIRED  Tobacco Use   Smoking status: Former    Current packs/day: 0.00    Average packs/day: 0.3 packs/day for 44.0 years (11.0 ttl pk-yrs)    Types: Cigarettes    Start date: 05/14/1948    Quit date: 05/14/1992    Years since quitting: 30.6   Smokeless tobacco: Never  Vaping Use   Vaping status: Never Used  Substance and Sexual Activity   Alcohol use: No   Drug use: No   Sexual activity: Not on file    Comment: married 63 years  Other Topics Concern   Not on file  Social History Narrative   Not on file   Social Determinants of Health   Financial Resource Strain: Not on file  Food Insecurity: No Food Insecurity (05/22/2022)   Hunger Vital Sign    Worried About Running Out of Food in the Last Year: Never true    Ran Out of Food in the Last Year: Never true  Transportation Needs: No Transportation Needs (05/22/2022)   PRAPARE - Administrator, Civil Service (Medical): No    Lack of Transportation (Non-Medical): No  Physical Activity: Not on file  Stress: Not on file  Social Connections: Not on file  Intimate Partner Violence: Not At Risk (05/22/2022)   Humiliation, Afraid, Rape, and Kick questionnaire    Fear of Current or Ex-Partner: No    Emotionally Abused: No    Physically Abused: No    Sexually Abused: No    Family History: Family History  Problem Relation Age of Onset   Colon cancer Father    Coronary artery disease Other     Current Medications:  Current Outpatient Medications:    apixaban (ELIQUIS)  2.5 MG TABS tablet, Take 1 tablet (2.5 mg total) by mouth 2 (two) times daily., Disp: 60 tablet, Rfl: 11   carvedilol (COREG) 12.5 MG tablet, TAKE 1 TABLET(12.5 MG) BY MOUTH TWICE DAILY WITH A MEAL, Disp: 180 tablet, Rfl: 3   docusate sodium (COLACE) 100 MG capsule, Take 100 mg by mouth 2 (two) times daily., Disp: , Rfl:    ferrous sulfate 325 (65 FE) MG EC tablet, Take 325 mg by mouth 3 (three) times daily with meals., Disp: , Rfl:    furosemide (LASIX) 40 MG tablet, TAKE 1 TABLET(40 MG) BY MOUTH DAILY (Patient taking differently: Take 20 mg by mouth daily.), Disp: 90 tablet, Rfl: 3   guaifenesin (ROBITUSSIN) 100 MG/5ML syrup, Take 200 mg by mouth 3 (three) times daily  as needed for cough., Disp: , Rfl:    insulin degludec (TRESIBA FLEXTOUCH) 100 UNIT/ML FlexTouch Pen, Inject 10 Units into the skin daily., Disp: , Rfl:    Ipratropium-Albuterol (COMBIVENT) 20-100 MCG/ACT AERS respimat, Inhale 1 puff into the lungs 2 (two) times daily., Disp: 1 each, Rfl: 0   nitroGLYCERIN (NITROSTAT) 0.4 MG SL tablet, Place 1 tablet (0.4 mg total) under the tongue every 5 (five) minutes as needed., Disp: 25 tablet, Rfl: 3   potassium chloride (KLOR-CON M) 10 MEQ tablet, Take 10 mEq by mouth 2 (two) times daily., Disp: , Rfl:    predniSONE (DELTASONE) 50 MG tablet, Take 1 tablet (50 mg total) by mouth daily with breakfast. X 4 days, Disp: 4 tablet, Rfl: 0   sacubitril-valsartan (ENTRESTO) 24-26 MG, Take 1 tablet by mouth 2 (two) times daily., Disp: 60 tablet, Rfl: 11   simvastatin (ZOCOR) 40 MG tablet, TAKE 1 TABLET BY MOUTH EVERY DAY IN THE EVENING, Disp: 90 tablet, Rfl: 2   Allergies: Allergies  Allergen Reactions   Codeine Nausea And Vomiting    REVIEW OF SYSTEMS:   Review of Systems  Constitutional:  Negative for chills, fatigue and fever.  HENT:   Negative for lump/mass, mouth sores, nosebleeds, sore throat and trouble swallowing.   Eyes:  Negative for eye problems.  Respiratory:  Negative for cough and  shortness of breath.   Cardiovascular:  Negative for chest pain, leg swelling and palpitations.  Gastrointestinal:  Negative for abdominal pain, constipation, diarrhea, nausea and vomiting.  Genitourinary:  Negative for bladder incontinence, difficulty urinating, dysuria, frequency, hematuria and nocturia.   Musculoskeletal:  Negative for arthralgias, back pain, flank pain, myalgias and neck pain.  Skin:  Negative for itching and rash.  Neurological:  Negative for dizziness, headaches and numbness.  Hematological:  Does not bruise/bleed easily.  Psychiatric/Behavioral:  Negative for depression, sleep disturbance and suicidal ideas. The patient is not nervous/anxious.   All other systems reviewed and are negative.    VITALS:   There were no vitals taken for this visit.  Wt Readings from Last 3 Encounters:  11/11/22 154 lb 4.8 oz (70 kg)  09/04/22 155 lb (70.3 kg)  07/04/22 143 lb 12.8 oz (65.2 kg)    There is no height or weight on file to calculate BMI.  Performance status (ECOG): {CHL ONC [id number]  PHYSICAL EXAM:   Physical Exam Vitals and nursing note reviewed. Exam conducted with a chaperone present.  Constitutional:      Appearance: Normal appearance.  Cardiovascular:     Rate and Rhythm: Normal rate and regular rhythm.     Pulses: Normal pulses.     Heart sounds: Normal heart sounds.  Pulmonary:     Effort: Pulmonary effort is normal.     Breath sounds: Normal breath sounds.  Abdominal:     Palpations: Abdomen is soft. There is no hepatomegaly, splenomegaly or mass.     Tenderness: There is no abdominal tenderness.  Musculoskeletal:     Right lower leg: No edema.     Left lower leg: No edema.  Lymphadenopathy:     Cervical: No cervical adenopathy.     Right cervical: No superficial, deep or posterior cervical adenopathy.    Left cervical: No superficial, deep or posterior cervical adenopathy.     Upper Body:     Right upper body: No supraclavicular or  axillary adenopathy.     Left upper body: No supraclavicular or axillary adenopathy.  Neurological:  General: No focal deficit present.     Mental Status: He is alert and oriented to person, place, and time.  Psychiatric:        Mood and Affect: Mood normal.        Behavior: Behavior normal.     LABS:      Latest Ref Rng & Units 12/13/2022    1:01 PM 10/03/2022    9:30 AM 06/07/2022    1:10 PM  CBC  WBC 4.0 - 10.5 K/uL 4.4  5.1  6.9   Hemoglobin 13.0 - 17.0 g/dL 16.1  09.6  04.5   Hematocrit 39.0 - 52.0 % 39.8  37.7  34.3   Platelets 150 - 400 K/uL 149  121  124       Latest Ref Rng & Units 12/13/2022    1:01 PM 10/03/2022    9:30 AM 06/07/2022    1:10 PM  CMP  Glucose 70 - 99 mg/dL 409  811  914   BUN 8 - 23 mg/dL 36  35  31   Creatinine 0.61 - 1.24 mg/dL 7.82  9.56  2.13   Sodium 135 - 145 mmol/L 136  140  138   Potassium 3.5 - 5.1 mmol/L 4.4  4.8  4.2   Chloride 98 - 111 mmol/L 100  102  101   CO2 22 - 32 mmol/L 27  24  27    Calcium 8.9 - 10.3 mg/dL 8.5  9.2  8.9   Total Protein 6.5 - 8.1 g/dL 6.4   6.1   Total Bilirubin 0.3 - 1.2 mg/dL 0.6   0.8   Alkaline Phos 38 - 126 U/L 44   42   AST 15 - 41 U/L 10   17   ALT 0 - 44 U/L 10   13      No results found for: "CEA1", "CEA" / No results found for: "CEA1", "CEA" Lab Results  Component Value Date   PSA1 0.1 11/08/2020   No results found for: "YQM578" No results found for: "CAN125"  Lab Results  Component Value Date   TOTALPROTELP 6.9 09/30/2015   TOTALPROTELP 6.7 09/30/2015   ALBUMINELP 3.8 09/30/2015   A1GS 0.2 09/30/2015   A2GS 0.8 09/30/2015   BETS 0.8 09/30/2015   GAMS 1.0 09/30/2015   MSPIKE Not Observed 09/30/2015   SPEI Comment 09/30/2015   Lab Results  Component Value Date   TIBC 295 12/13/2022   TIBC 296 06/07/2022   TIBC 283 07/24/2021   FERRITIN 46 12/13/2022   FERRITIN 55 06/07/2022   FERRITIN 99 05/25/2022   IRONPCTSAT 27 12/13/2022   IRONPCTSAT 22 06/07/2022   IRONPCTSAT 15 (L)  07/24/2021   Lab Results  Component Value Date   LDH 196 (H) 12/13/2022   LDH 239 (H) 06/07/2022   LDH 205 (H) 03/07/2022     STUDIES:   CT Abdomen Pelvis W Contrast  Result Date: 12/19/2022 CLINICAL DATA:  Follow-up of adrenal mass. Adrenal carcinoma. Localized. Monitor. * Tracking Code: BO * EXAM: CT ABDOMEN AND PELVIS WITH CONTRAST TECHNIQUE: Multidetector CT imaging of the abdomen and pelvis was performed using the standard protocol following bolus administration of intravenous contrast. RADIATION DOSE REDUCTION: This exam was performed according to the departmental dose-optimization program which includes automated exposure control, adjustment of the mA and/or kV according to patient size and/or use of iterative reconstruction technique. CONTRAST:  75mL OMNIPAQUE IOHEXOL 300 MG/ML  SOLN COMPARISON:  06/07/2022 FINDINGS: Lower chest: Clear lung bases. Small left  and trace right pleural fluid, decreased. Cardiomegaly. Incompletely imaged pacer. Median sternotomy with native coronary artery calcification. Hepatobiliary: Normal liver. Gallstones up to 12 mm without acute cholecystitis or biliary duct dilatation. Pancreas: Pancreatic atrophy is within normal variation for age. Spleen: Normal in size, without focal abnormality. Adrenals/Urinary Tract: Normal right adrenal gland. Heterogeneous left adrenal mass measures 6.0 x 5.7 cm on 28/2 versus 7.0 by 6.8 cm when remeasured in a similar fashion on the prior. Bilateral too small to characterize renal lesions. Right renal cysts including at up to 5.5 cm. An upper pole right renal minimally complex cyst of 9 mm . In the absence of clinically indicated signs/symptoms require(s) no independent follow-up. No hydronephrosis. Normal urinary bladder. Stomach/Bowel: Normal stomach, without wall thickening. Descending duodenal diverticulum. Otherwise normal small bowel. Scattered colonic diverticula. Normal colon and terminal ileum. Vascular/Lymphatic: Advanced  aortic and branch vessel atherosclerosis. No abdominopelvic adenopathy. Reproductive: Normal prostate. Other: No significant free fluid. Musculoskeletal: Moderate bilateral gynecomastia. Remote lower right rib fractures. IMPRESSION: 1. Further decrease in size of left adrenal mass, presumably treatment response. If the patient has not undergone interval therapy, could represent resolving hematoma superimposed upon underlying mass. 2. No evidence of abdominal metastatic disease. 3. Decrease in tiny left and trace right pleural effusions. 4. Incidental findings, including: Cholelithiasis. Gynecomastia. Aortic Atherosclerosis (ICD10-I70.0). Electronically Signed   By: Jeronimo Greaves M.D.   On: 12/19/2022 11:46

## 2022-12-26 ENCOUNTER — Inpatient Hospital Stay: Payer: Medicare Other | Admitting: Hematology

## 2022-12-26 VITALS — BP 117/51 | HR 64 | Temp 97.5°F | Resp 16 | Wt 150.2 lb

## 2022-12-26 DIAGNOSIS — E279 Disorder of adrenal gland, unspecified: Secondary | ICD-10-CM | POA: Diagnosis not present

## 2022-12-26 DIAGNOSIS — E278 Other specified disorders of adrenal gland: Secondary | ICD-10-CM

## 2022-12-26 DIAGNOSIS — E611 Iron deficiency: Secondary | ICD-10-CM

## 2022-12-26 DIAGNOSIS — D4620 Refractory anemia with excess of blasts, unspecified: Secondary | ICD-10-CM | POA: Diagnosis not present

## 2022-12-26 NOTE — Patient Instructions (Addendum)
[facility] at [facility] Discharge Instructions   You were seen and examined today by Dr. Ellin Saba.  He reviewed the results of your lab work which are normal/stable.   He reviewed the results of your CT scan which is stable.   We will see you back in 1 year. Return as scheduled.    Thank you for choosing [facility] at [facility] to provide your oncology and hematology care.  To afford each patient quality time with our provider, please arrive at least 15 minutes before your scheduled appointment time.   If you have a lab appointment with the Cancer Center please come in thru the Main Entrance and check in at the main information desk.  You need to re-schedule your appointment should you arrive 10 or more minutes late.  We strive to give you quality time with our providers, and arriving late affects you and other patients whose appointments are after yours.  Also, if you no show three or more times for appointments you may be dismissed from the clinic at the providers discretion.     Again, thank you for choosing [facility].  Our hope is that these requests will decrease the amount of time that you wait before being seen by our physicians.       _____________________________________________________________  Should you have questions after your visit to [facility], please contact our office at [phone] and follow the prompts.  Our office hours are 8:00 a.m. and 4:30 p.m. Monday - Friday.  Please note that voicemails left after 4:00 p.m. may not be returned until the following business day.  We are closed weekends and major holidays.  You do have access to a nurse 24-7, just call the main number to the clinic [phone] and do not press any options, hold on the line and a nurse will answer the phone.    For prescription refill requests, have your pharmacy contact our office and allow 72 hours.    Due to  Covid, you will need to wear a mask upon entering the hospital. If you do not have a mask, a mask will be given to you at the Main Entrance upon arrival. For doctor visits, patients may have 1 support person age 1 or older with them. For treatment visits, patients can not have anyone with them due to social distancing guidelines and our immunocompromised population.

## 2022-12-27 DIAGNOSIS — N1831 Chronic kidney disease, stage 3a: Secondary | ICD-10-CM | POA: Diagnosis not present

## 2022-12-27 DIAGNOSIS — I130 Hypertensive heart and chronic kidney disease with heart failure and stage 1 through stage 4 chronic kidney disease, or unspecified chronic kidney disease: Secondary | ICD-10-CM | POA: Diagnosis not present

## 2022-12-27 DIAGNOSIS — N202 Calculus of kidney with calculus of ureter: Secondary | ICD-10-CM | POA: Diagnosis not present

## 2022-12-27 DIAGNOSIS — E1122 Type 2 diabetes mellitus with diabetic chronic kidney disease: Secondary | ICD-10-CM | POA: Diagnosis not present

## 2022-12-27 DIAGNOSIS — I5022 Chronic systolic (congestive) heart failure: Secondary | ICD-10-CM | POA: Diagnosis not present

## 2022-12-27 DIAGNOSIS — I2510 Atherosclerotic heart disease of native coronary artery without angina pectoris: Secondary | ICD-10-CM | POA: Diagnosis not present

## 2022-12-30 DIAGNOSIS — Z794 Long term (current) use of insulin: Secondary | ICD-10-CM | POA: Diagnosis not present

## 2022-12-30 DIAGNOSIS — N202 Calculus of kidney with calculus of ureter: Secondary | ICD-10-CM | POA: Diagnosis not present

## 2022-12-30 DIAGNOSIS — I2510 Atherosclerotic heart disease of native coronary artery without angina pectoris: Secondary | ICD-10-CM | POA: Diagnosis not present

## 2022-12-30 DIAGNOSIS — Z7901 Long term (current) use of anticoagulants: Secondary | ICD-10-CM | POA: Diagnosis not present

## 2022-12-30 DIAGNOSIS — F02B0 Dementia in other diseases classified elsewhere, moderate, without behavioral disturbance, psychotic disturbance, mood disturbance, and anxiety: Secondary | ICD-10-CM | POA: Diagnosis not present

## 2022-12-30 DIAGNOSIS — Z9181 History of falling: Secondary | ICD-10-CM | POA: Diagnosis not present

## 2022-12-30 DIAGNOSIS — E538 Deficiency of other specified B group vitamins: Secondary | ICD-10-CM | POA: Diagnosis not present

## 2022-12-30 DIAGNOSIS — I480 Paroxysmal atrial fibrillation: Secondary | ICD-10-CM | POA: Diagnosis not present

## 2022-12-30 DIAGNOSIS — M109 Gout, unspecified: Secondary | ICD-10-CM | POA: Diagnosis not present

## 2022-12-30 DIAGNOSIS — N1831 Chronic kidney disease, stage 3a: Secondary | ICD-10-CM | POA: Diagnosis not present

## 2022-12-30 DIAGNOSIS — E1122 Type 2 diabetes mellitus with diabetic chronic kidney disease: Secondary | ICD-10-CM | POA: Diagnosis not present

## 2022-12-30 DIAGNOSIS — I130 Hypertensive heart and chronic kidney disease with heart failure and stage 1 through stage 4 chronic kidney disease, or unspecified chronic kidney disease: Secondary | ICD-10-CM | POA: Diagnosis not present

## 2022-12-30 DIAGNOSIS — I5022 Chronic systolic (congestive) heart failure: Secondary | ICD-10-CM | POA: Diagnosis not present

## 2022-12-30 DIAGNOSIS — Z87891 Personal history of nicotine dependence: Secondary | ICD-10-CM | POA: Diagnosis not present

## 2022-12-30 DIAGNOSIS — Z8616 Personal history of COVID-19: Secondary | ICD-10-CM | POA: Diagnosis not present

## 2023-01-02 DIAGNOSIS — I2510 Atherosclerotic heart disease of native coronary artery without angina pectoris: Secondary | ICD-10-CM | POA: Diagnosis not present

## 2023-01-02 DIAGNOSIS — N1831 Chronic kidney disease, stage 3a: Secondary | ICD-10-CM | POA: Diagnosis not present

## 2023-01-02 DIAGNOSIS — E1122 Type 2 diabetes mellitus with diabetic chronic kidney disease: Secondary | ICD-10-CM | POA: Diagnosis not present

## 2023-01-02 DIAGNOSIS — I5022 Chronic systolic (congestive) heart failure: Secondary | ICD-10-CM | POA: Diagnosis not present

## 2023-01-02 DIAGNOSIS — I130 Hypertensive heart and chronic kidney disease with heart failure and stage 1 through stage 4 chronic kidney disease, or unspecified chronic kidney disease: Secondary | ICD-10-CM | POA: Diagnosis not present

## 2023-01-02 DIAGNOSIS — N202 Calculus of kidney with calculus of ureter: Secondary | ICD-10-CM | POA: Diagnosis not present

## 2023-01-10 ENCOUNTER — Ambulatory Visit: Payer: Medicare Other | Attending: Cardiology | Admitting: Cardiology

## 2023-01-10 ENCOUNTER — Encounter: Payer: Self-pay | Admitting: Cardiology

## 2023-01-10 VITALS — BP 102/60 | HR 63 | Ht 66.0 in | Wt 154.6 lb

## 2023-01-10 DIAGNOSIS — E1122 Type 2 diabetes mellitus with diabetic chronic kidney disease: Secondary | ICD-10-CM | POA: Diagnosis not present

## 2023-01-10 DIAGNOSIS — I130 Hypertensive heart and chronic kidney disease with heart failure and stage 1 through stage 4 chronic kidney disease, or unspecified chronic kidney disease: Secondary | ICD-10-CM | POA: Diagnosis not present

## 2023-01-10 DIAGNOSIS — I5022 Chronic systolic (congestive) heart failure: Secondary | ICD-10-CM | POA: Diagnosis not present

## 2023-01-10 DIAGNOSIS — I480 Paroxysmal atrial fibrillation: Secondary | ICD-10-CM | POA: Diagnosis not present

## 2023-01-10 DIAGNOSIS — I2510 Atherosclerotic heart disease of native coronary artery without angina pectoris: Secondary | ICD-10-CM | POA: Diagnosis not present

## 2023-01-10 DIAGNOSIS — N1832 Chronic kidney disease, stage 3b: Secondary | ICD-10-CM | POA: Insufficient documentation

## 2023-01-10 DIAGNOSIS — N1831 Chronic kidney disease, stage 3a: Secondary | ICD-10-CM | POA: Diagnosis not present

## 2023-01-10 DIAGNOSIS — I25119 Atherosclerotic heart disease of native coronary artery with unspecified angina pectoris: Secondary | ICD-10-CM | POA: Diagnosis not present

## 2023-01-10 DIAGNOSIS — N202 Calculus of kidney with calculus of ureter: Secondary | ICD-10-CM | POA: Diagnosis not present

## 2023-01-10 NOTE — Progress Notes (Signed)
    Cardiology Office Note  Date: 01/10/2023   ID: [name], DOB 09/07/1926, MRN [id number]  History of Present Illness: [name] is a 89 y.o. male last seen in February.  He is here today with his son for a follow-up visit.  Continues to reside at [facility].  His wife now has hospice care at the same facility.  He is in a wheelchair today, states that in general he feels reasonably well.  Specifically, no chest pain or nitroglycerin use, no dyspnea at rest, no palpitations.  Medtronic dual-chamber pacemaker in place with follow-up by Dr. Ladona Ridgel.  Device interrogation in June revealed normal function with relatively low AF burden.  I reviewed his medications which are unchanged from a cardiac perspective.  He does not report any bleeding problems on Eliquis, appropriately dosed based on his most recent lab work.  Physical Exam: VS:  BP 102/60 (BP Location: Right Arm, Patient Position: Sitting, Cuff Size: Normal)   Pulse 63   Ht 5\' 6"  (1.676 m)   Wt 154 lb 9.6 oz (70.1 kg)   SpO2 96%   BMI 24.95 kg/m , BMI Body mass index is 24.95 kg/m.  Wt Readings from Last 3 Encounters:  01/10/23 154 lb 9.6 oz (70.1 kg)  12/26/22 150 lb 3.2 oz (68.1 kg)  11/11/22 154 lb 4.8 oz (70 kg)    General: Patient appears comfortable at rest. HEENT: Conjunctiva and lids normal. Neck: Supple, no elevated JVP or carotid bruits. Lungs: Clear to auscultation, nonlabored breathing at rest. Cardiac: Regular rate and rhythm, no S3, 2/6 systolic murmur. Extremities: No pitting edema.  ECG:  An ECG dated 05/22/2022 was personally reviewed today and demonstrated:  Possible atrial flutter with ventricular pacing.  Labwork: 05/23/2022: B Natriuretic Peptide 1,906.0 05/25/2022: Magnesium 2.4 12/13/2022: ALT 10; AST 10; BUN 36; Creatinine, Ser 1.76; Hemoglobin 13.0; Platelets 149; Potassium 4.4; Sodium 136     Component Value Date/Time   CHOL 125 12/17/2020 0137   TRIG 71 12/17/2020 0137   HDL 29 (L)  12/17/2020 0137   CHOLHDL 4.3 12/17/2020 0137   VLDL 14 12/17/2020 0137   LDLCALC 82 12/17/2020 0137   LDLCALC 78 06/09/2019 0808   Other Studies Reviewed Today:  No interval cardiac testing for review today.  Assessment and Plan:  1.  Multivessel CAD status post CABG in 2006 with LIMA to LAD, SVG to circumflex, and SVG to RCA.  No angina with low-level activity.  He is not on aspirin given use of Eliquis.  Continue Zocor and as needed nitroglycerin.  2.  HFmrEF, LVEF 40 to 45% by echocardiogram in August 2022.  Clinically stable at this time.  He is on Coreg, Entresto, and Lasix with potassium supplement.  No changes were made today.  3.  Complete heart block status post Medtronic pacemaker with follow-up with Dr. Ladona Ridgel.  4.  Paroxysmal atrial fibrillation/flutter with CHA2DS2-VASc score of 5.  He is asymptomatic and has had low rhythm burden based on device interrogation.  Continue Eliquis for stroke prophylaxis.  5.  CKD stage IIIb, creatinine 1.76 with normal potassium based on recent lab work.  Disposition:  Follow up  6 months.  Signed, Jonelle Sidle, M.D., F.A.C.C. [facility] at [facility]

## 2023-01-10 NOTE — Patient Instructions (Signed)
Medication Instructions:  Your physician recommends that you continue on your current medications as directed. Please refer to the Current Medication list given to you today.   Labwork: None today  Testing/Procedures: None today  Follow-Up: 6 months  Any Other Special Instructions Will Be Listed Below (If Applicable).  If you need a refill on your cardiac medications before your next appointment, please call your pharmacy.  

## 2023-01-24 ENCOUNTER — Ambulatory Visit (INDEPENDENT_AMBULATORY_CARE_PROVIDER_SITE_OTHER): Payer: Medicare Other

## 2023-01-24 DIAGNOSIS — I442 Atrioventricular block, complete: Secondary | ICD-10-CM

## 2023-01-24 LAB — CUP PACEART REMOTE DEVICE CHECK
Battery Remaining Longevity: 106 mo
Battery Voltage: 3.02 V
Brady Statistic AP VP Percent: 27.51 %
Brady Statistic AP VS Percent: 0 %
Brady Statistic AS VP Percent: 70.04 %
Brady Statistic AS VS Percent: 2.45 %
Brady Statistic RA Percent Paced: 29.27 %
Brady Statistic RV Percent Paced: 97.55 %
Date Time Interrogation Session: 20240912004056
Implantable Lead Connection Status: 753985
Implantable Lead Connection Status: 753985
Implantable Lead Implant Date: 20071018
Implantable Lead Implant Date: 20221212
Implantable Lead Location: 753859
Implantable Lead Location: 753860
Implantable Lead Model: 5076
Implantable Lead Model: 6947
Implantable Pulse Generator Implant Date: 20221212
Lead Channel Impedance Value: 228 Ohm
Lead Channel Impedance Value: 304 Ohm
Lead Channel Impedance Value: 323 Ohm
Lead Channel Impedance Value: 361 Ohm
Lead Channel Pacing Threshold Amplitude: 0.5 V
Lead Channel Pacing Threshold Amplitude: 0.625 V
Lead Channel Pacing Threshold Pulse Width: 0.4 ms
Lead Channel Pacing Threshold Pulse Width: 0.4 ms
Lead Channel Sensing Intrinsic Amplitude: 0.5 mV
Lead Channel Sensing Intrinsic Amplitude: 0.5 mV
Lead Channel Sensing Intrinsic Amplitude: 9.625 mV
Lead Channel Sensing Intrinsic Amplitude: 9.625 mV
Lead Channel Setting Pacing Amplitude: 2 V
Lead Channel Setting Pacing Amplitude: 2.5 V
Lead Channel Setting Pacing Pulse Width: 0.4 ms
Lead Channel Setting Sensing Sensitivity: 5.6 mV
Zone Setting Status: 755011
Zone Setting Status: 755011

## 2023-02-01 NOTE — Progress Notes (Signed)
Remote pacemaker transmission.   

## 2023-02-04 DIAGNOSIS — E538 Deficiency of other specified B group vitamins: Secondary | ICD-10-CM | POA: Diagnosis not present

## 2023-02-13 DIAGNOSIS — Z23 Encounter for immunization: Secondary | ICD-10-CM | POA: Diagnosis not present

## 2023-02-19 DIAGNOSIS — B351 Tinea unguium: Secondary | ICD-10-CM | POA: Diagnosis not present

## 2023-02-19 DIAGNOSIS — M79675 Pain in left toe(s): Secondary | ICD-10-CM | POA: Diagnosis not present

## 2023-02-19 DIAGNOSIS — N1832 Chronic kidney disease, stage 3b: Secondary | ICD-10-CM | POA: Diagnosis not present

## 2023-02-19 DIAGNOSIS — M79674 Pain in right toe(s): Secondary | ICD-10-CM | POA: Diagnosis not present

## 2023-02-19 DIAGNOSIS — E1122 Type 2 diabetes mellitus with diabetic chronic kidney disease: Secondary | ICD-10-CM | POA: Diagnosis not present

## 2023-02-27 DIAGNOSIS — I10 Essential (primary) hypertension: Secondary | ICD-10-CM | POA: Diagnosis not present

## 2023-02-27 DIAGNOSIS — N1832 Chronic kidney disease, stage 3b: Secondary | ICD-10-CM | POA: Diagnosis not present

## 2023-02-27 DIAGNOSIS — E1122 Type 2 diabetes mellitus with diabetic chronic kidney disease: Secondary | ICD-10-CM | POA: Diagnosis not present

## 2023-03-07 DIAGNOSIS — I2510 Atherosclerotic heart disease of native coronary artery without angina pectoris: Secondary | ICD-10-CM | POA: Diagnosis not present

## 2023-03-07 DIAGNOSIS — E1169 Type 2 diabetes mellitus with other specified complication: Secondary | ICD-10-CM | POA: Diagnosis not present

## 2023-03-07 DIAGNOSIS — I890 Lymphedema, not elsewhere classified: Secondary | ICD-10-CM | POA: Diagnosis not present

## 2023-03-07 DIAGNOSIS — E782 Mixed hyperlipidemia: Secondary | ICD-10-CM | POA: Diagnosis not present

## 2023-03-07 DIAGNOSIS — I480 Paroxysmal atrial fibrillation: Secondary | ICD-10-CM | POA: Diagnosis not present

## 2023-03-07 DIAGNOSIS — R600 Localized edema: Secondary | ICD-10-CM | POA: Diagnosis not present

## 2023-03-07 DIAGNOSIS — J9601 Acute respiratory failure with hypoxia: Secondary | ICD-10-CM | POA: Diagnosis not present

## 2023-03-07 DIAGNOSIS — Z951 Presence of aortocoronary bypass graft: Secondary | ICD-10-CM | POA: Diagnosis not present

## 2023-03-07 DIAGNOSIS — I5022 Chronic systolic (congestive) heart failure: Secondary | ICD-10-CM | POA: Diagnosis not present

## 2023-03-07 DIAGNOSIS — E538 Deficiency of other specified B group vitamins: Secondary | ICD-10-CM | POA: Diagnosis not present

## 2023-03-07 DIAGNOSIS — D61818 Other pancytopenia: Secondary | ICD-10-CM | POA: Diagnosis not present

## 2023-03-07 DIAGNOSIS — N1831 Chronic kidney disease, stage 3a: Secondary | ICD-10-CM | POA: Diagnosis not present

## 2023-03-28 DIAGNOSIS — Z23 Encounter for immunization: Secondary | ICD-10-CM | POA: Diagnosis not present

## 2023-04-04 DIAGNOSIS — E538 Deficiency of other specified B group vitamins: Secondary | ICD-10-CM | POA: Diagnosis not present

## 2023-04-04 DIAGNOSIS — H6121 Impacted cerumen, right ear: Secondary | ICD-10-CM | POA: Diagnosis not present

## 2023-04-23 DIAGNOSIS — R2230 Localized swelling, mass and lump, unspecified upper limb: Secondary | ICD-10-CM | POA: Diagnosis not present

## 2023-04-23 DIAGNOSIS — E538 Deficiency of other specified B group vitamins: Secondary | ICD-10-CM | POA: Diagnosis not present

## 2023-04-23 DIAGNOSIS — L03113 Cellulitis of right upper limb: Secondary | ICD-10-CM | POA: Diagnosis not present

## 2023-04-23 DIAGNOSIS — M7989 Other specified soft tissue disorders: Secondary | ICD-10-CM | POA: Diagnosis not present

## 2023-04-23 DIAGNOSIS — L0390 Cellulitis, unspecified: Secondary | ICD-10-CM | POA: Diagnosis not present

## 2023-04-25 ENCOUNTER — Ambulatory Visit: Payer: Medicare Other

## 2023-04-25 DIAGNOSIS — I442 Atrioventricular block, complete: Secondary | ICD-10-CM | POA: Diagnosis not present

## 2023-04-25 LAB — CUP PACEART REMOTE DEVICE CHECK
Battery Remaining Longevity: 102 mo
Battery Voltage: 3.01 V
Brady Statistic AP VP Percent: 33.52 %
Brady Statistic AP VS Percent: 0 %
Brady Statistic AS VP Percent: 65.54 %
Brady Statistic AS VS Percent: 0.94 %
Brady Statistic RA Percent Paced: 34.09 %
Brady Statistic RV Percent Paced: 99.06 %
Date Time Interrogation Session: 20241212083728
Implantable Lead Connection Status: 753985
Implantable Lead Connection Status: 753985
Implantable Lead Implant Date: 20071018
Implantable Lead Implant Date: 20221212
Implantable Lead Location: 753859
Implantable Lead Location: 753860
Implantable Lead Model: 5076
Implantable Lead Model: 6947
Implantable Pulse Generator Implant Date: 20221212
Lead Channel Impedance Value: 247 Ohm
Lead Channel Impedance Value: 304 Ohm
Lead Channel Impedance Value: 304 Ohm
Lead Channel Impedance Value: 361 Ohm
Lead Channel Pacing Threshold Amplitude: 0.5 V
Lead Channel Pacing Threshold Amplitude: 0.625 V
Lead Channel Pacing Threshold Pulse Width: 0.4 ms
Lead Channel Pacing Threshold Pulse Width: 0.4 ms
Lead Channel Sensing Intrinsic Amplitude: 0.625 mV
Lead Channel Sensing Intrinsic Amplitude: 0.625 mV
Lead Channel Sensing Intrinsic Amplitude: 31.625 mV
Lead Channel Sensing Intrinsic Amplitude: 31.625 mV
Lead Channel Setting Pacing Amplitude: 2 V
Lead Channel Setting Pacing Amplitude: 2.5 V
Lead Channel Setting Pacing Pulse Width: 0.4 ms
Lead Channel Setting Sensing Sensitivity: 5.6 mV
Zone Setting Status: 755011
Zone Setting Status: 755011

## 2023-05-20 DIAGNOSIS — M79675 Pain in left toe(s): Secondary | ICD-10-CM | POA: Diagnosis not present

## 2023-05-20 DIAGNOSIS — B351 Tinea unguium: Secondary | ICD-10-CM | POA: Diagnosis not present

## 2023-05-20 DIAGNOSIS — M79674 Pain in right toe(s): Secondary | ICD-10-CM | POA: Diagnosis not present

## 2023-05-30 NOTE — Progress Notes (Signed)
Remote pacemaker transmission.   

## 2023-06-04 DIAGNOSIS — E538 Deficiency of other specified B group vitamins: Secondary | ICD-10-CM | POA: Diagnosis not present

## 2023-06-18 ENCOUNTER — Emergency Department (HOSPITAL_COMMUNITY): Payer: Medicare Other

## 2023-06-18 ENCOUNTER — Emergency Department (HOSPITAL_COMMUNITY)
Admission: EM | Admit: 2023-06-18 | Discharge: 2023-06-18 | Disposition: A | Discharge status: Home / Self Care | Payer: Medicare Other | Attending: Emergency Medicine | Admitting: Emergency Medicine

## 2023-06-18 ENCOUNTER — Other Ambulatory Visit: Payer: Self-pay

## 2023-06-18 ENCOUNTER — Encounter (HOSPITAL_COMMUNITY): Payer: Self-pay

## 2023-06-18 DIAGNOSIS — K8689 Other specified diseases of pancreas: Secondary | ICD-10-CM | POA: Diagnosis not present

## 2023-06-18 DIAGNOSIS — S51011A Laceration without foreign body of right elbow, initial encounter: Secondary | ICD-10-CM | POA: Diagnosis not present

## 2023-06-18 DIAGNOSIS — W19XXXA Unspecified fall, initial encounter: Secondary | ICD-10-CM | POA: Diagnosis not present

## 2023-06-18 DIAGNOSIS — R197 Diarrhea, unspecified: Secondary | ICD-10-CM | POA: Diagnosis not present

## 2023-06-18 DIAGNOSIS — Z794 Long term (current) use of insulin: Secondary | ICD-10-CM | POA: Diagnosis not present

## 2023-06-18 DIAGNOSIS — I10 Essential (primary) hypertension: Secondary | ICD-10-CM | POA: Insufficient documentation

## 2023-06-18 DIAGNOSIS — Z043 Encounter for examination and observation following other accident: Secondary | ICD-10-CM | POA: Diagnosis not present

## 2023-06-18 DIAGNOSIS — Z20822 Contact with and (suspected) exposure to covid-19: Secondary | ICD-10-CM | POA: Insufficient documentation

## 2023-06-18 DIAGNOSIS — Z7901 Long term (current) use of anticoagulants: Secondary | ICD-10-CM | POA: Insufficient documentation

## 2023-06-18 DIAGNOSIS — I110 Hypertensive heart disease with heart failure: Secondary | ICD-10-CM | POA: Insufficient documentation

## 2023-06-18 DIAGNOSIS — Z95810 Presence of automatic (implantable) cardiac defibrillator: Secondary | ICD-10-CM | POA: Insufficient documentation

## 2023-06-18 DIAGNOSIS — I2510 Atherosclerotic heart disease of native coronary artery without angina pectoris: Secondary | ICD-10-CM | POA: Diagnosis not present

## 2023-06-18 DIAGNOSIS — E279 Disorder of adrenal gland, unspecified: Secondary | ICD-10-CM | POA: Diagnosis not present

## 2023-06-18 DIAGNOSIS — K5730 Diverticulosis of large intestine without perforation or abscess without bleeding: Secondary | ICD-10-CM | POA: Insufficient documentation

## 2023-06-18 DIAGNOSIS — K8020 Calculus of gallbladder without cholecystitis without obstruction: Secondary | ICD-10-CM | POA: Diagnosis not present

## 2023-06-18 DIAGNOSIS — R112 Nausea with vomiting, unspecified: Secondary | ICD-10-CM | POA: Diagnosis not present

## 2023-06-18 DIAGNOSIS — Z7401 Bed confinement status: Secondary | ICD-10-CM | POA: Diagnosis not present

## 2023-06-18 DIAGNOSIS — Z951 Presence of aortocoronary bypass graft: Secondary | ICD-10-CM | POA: Diagnosis not present

## 2023-06-18 DIAGNOSIS — R531 Weakness: Secondary | ICD-10-CM | POA: Diagnosis not present

## 2023-06-18 DIAGNOSIS — N281 Cyst of kidney, acquired: Secondary | ICD-10-CM | POA: Insufficient documentation

## 2023-06-18 DIAGNOSIS — I700 Atherosclerosis of aorta: Secondary | ICD-10-CM | POA: Diagnosis not present

## 2023-06-18 DIAGNOSIS — R509 Fever, unspecified: Secondary | ICD-10-CM | POA: Diagnosis not present

## 2023-06-18 DIAGNOSIS — I509 Heart failure, unspecified: Secondary | ICD-10-CM | POA: Insufficient documentation

## 2023-06-18 DIAGNOSIS — Z8546 Personal history of malignant neoplasm of prostate: Secondary | ICD-10-CM | POA: Diagnosis not present

## 2023-06-18 DIAGNOSIS — Z79899 Other long term (current) drug therapy: Secondary | ICD-10-CM | POA: Insufficient documentation

## 2023-06-18 DIAGNOSIS — E119 Type 2 diabetes mellitus without complications: Secondary | ICD-10-CM | POA: Insufficient documentation

## 2023-06-18 DIAGNOSIS — R5383 Other fatigue: Secondary | ICD-10-CM | POA: Diagnosis present

## 2023-06-18 DIAGNOSIS — R609 Edema, unspecified: Secondary | ICD-10-CM | POA: Diagnosis not present

## 2023-06-18 DIAGNOSIS — R918 Other nonspecific abnormal finding of lung field: Secondary | ICD-10-CM | POA: Diagnosis not present

## 2023-06-18 DIAGNOSIS — X58XXXA Exposure to other specified factors, initial encounter: Secondary | ICD-10-CM | POA: Insufficient documentation

## 2023-06-18 LAB — COMPREHENSIVE METABOLIC PANEL
ALT: 12 U/L (ref 0–44)
AST: 18 U/L (ref 15–41)
Albumin: 3.4 g/dL — ABNORMAL LOW (ref 3.5–5.0)
Alkaline Phosphatase: 37 U/L — ABNORMAL LOW (ref 38–126)
Anion gap: 11 (ref 5–15)
BUN: 43 mg/dL — ABNORMAL HIGH (ref 8–23)
CO2: 20 mmol/L — ABNORMAL LOW (ref 22–32)
Calcium: 8.2 mg/dL — ABNORMAL LOW (ref 8.9–10.3)
Chloride: 105 mmol/L (ref 98–111)
Creatinine, Ser: 1.79 mg/dL — ABNORMAL HIGH (ref 0.61–1.24)
GFR, Estimated: 35 mL/min — ABNORMAL LOW (ref >60)
Glucose, Bld: 133 mg/dL — ABNORMAL HIGH (ref 70–99)
Potassium: 4.4 mmol/L (ref 3.5–5.1)
Sodium: 136 mmol/L (ref 135–145)
Total Bilirubin: 0.7 mg/dL (ref 0.0–1.2)
Total Protein: 6.1 g/dL — ABNORMAL LOW (ref 6.5–8.1)

## 2023-06-18 LAB — CBC WITH DIFFERENTIAL/PLATELET
Abs Immature Granulocytes: 0 10*3/uL (ref 0.00–0.07)
Basophils Absolute: 0 10*3/uL (ref 0.0–0.1)
Basophils Relative: 0 %
Eosinophils Absolute: 0 10*3/uL (ref 0.0–0.5)
Eosinophils Relative: 0 %
HCT: 38.5 % — ABNORMAL LOW (ref 39.0–52.0)
Hemoglobin: 12.3 g/dL — ABNORMAL LOW (ref 13.0–17.0)
Lymphocytes Relative: 31 %
Lymphs Abs: 1.9 10*3/uL (ref 0.7–4.0)
MCH: 32 pg (ref 26.0–34.0)
MCHC: 31.9 g/dL (ref 30.0–36.0)
MCV: 100.3 fL — ABNORMAL HIGH (ref 80.0–100.0)
Monocytes Absolute: 3.2 10*3/uL — ABNORMAL HIGH (ref 0.1–1.0)
Monocytes Relative: 52 %
Neutro Abs: 1.1 10*3/uL — ABNORMAL LOW (ref 1.7–7.7)
Neutrophils Relative %: 17 %
Platelets: 112 10*3/uL — ABNORMAL LOW (ref 150–400)
RBC: 3.84 MIL/uL — ABNORMAL LOW (ref 4.22–5.81)
RDW: 13.9 % (ref 11.5–15.5)
Smear Review: DECREASED
WBC: 6.2 10*3/uL (ref 4.0–10.5)
nRBC: 0 % (ref 0.0–0.2)

## 2023-06-18 LAB — MAGNESIUM: Magnesium: 2.1 mg/dL (ref 1.7–2.4)

## 2023-06-18 LAB — RESP PANEL BY RT-PCR (RSV, FLU A&B, COVID)  RVPGX2
Influenza A by PCR: NEGATIVE
Influenza B by PCR: NEGATIVE
Resp Syncytial Virus by PCR: NEGATIVE
SARS Coronavirus 2 by RT PCR: NEGATIVE

## 2023-06-18 LAB — LIPASE, BLOOD: Lipase: 21 U/L (ref 11–51)

## 2023-06-18 MED ORDER — ONDANSETRON HCL 4 MG/2ML IJ SOLN
4.0000 mg | Freq: Once | INTRAMUSCULAR | Status: AC
  Administered 2023-06-18: 4 mg via INTRAVENOUS
  Filled 2023-06-18: qty 2

## 2023-06-18 NOTE — ED Triage Notes (Signed)
Pt BIB ems for weakness, n/v/d since Saturday. Per EMS pt had fevers but during triage 98.9 oral. Pt denies pain just cold.

## 2023-06-18 NOTE — ED Notes (Signed)
Pt given sprite at this time. 

## 2023-06-18 NOTE — ED Provider Notes (Signed)
 [place] [facility]ERGENCY DEPARTMENT AT [facility] Provider Note   CSN: [id number] Arrival date & time: 06/18/23  1253     History  Chief Complaint  Patient presents with   Fatigue    [name] is a 89 y.o. male.  HPI     [name] is a 89 y.o. male with past medical history of prostate cancer, type 2 diabetes, hypertension CHF, CAD who resides at local SNF presents to the Emergency Department for evaluation of generalized weakness, fatigue and diarrhea.  Patient endorses decreased appetite for several days.  He states he has also had multiple episodes of green diarrhea for several days.  Few episodes of vomiting as well.  States his last episode of diarrhea was yesterday.  Has tolerated some liquids.  He denies any abdominal pain fever or chills.  I contacted [facility] for further history after patient gave verbal consent.  Caregiver, Patience, at the facility endorsed decreased appetite and decreased urine output with watery green diarrhea for several days.  She denies any fever or recent antibiotics.  No recent falls.  Home Medications Prior to Admission medications   Medication Sig Start Date End Date Taking? Authorizing Provider  apixaban  (ELIQUIS ) 2.5 MG TABS tablet Take 1 tablet (2.5 mg total) by mouth 2 (two) times daily. 09/04/22   Waddell Danelle ORN, MD  carvedilol  (COREG ) 12.5 MG tablet TAKE 1 TABLET(12.5 MG) BY MOUTH TWICE DAILY WITH A MEAL 04/09/22   Debera Jayson MATSU, MD  docusate sodium  (COLACE) 100 MG capsule Take 100 mg by mouth 2 (two) times daily.    [provider]  ferrous sulfate  325 (65 FE) MG EC tablet Take 325 mg by mouth 3 (three) times daily with meals.    [provider]  furosemide  (LASIX ) 40 MG tablet TAKE 1 TABLET(40 MG) BY MOUTH DAILY Patient taking differently: Take 20 mg by mouth daily. 07/28/21   Debera Jayson MATSU, MD  insulin  degludec (TRESIBA FLEXTOUCH) 100 UNIT/ML FlexTouch Pen Inject 10 Units into  the skin daily.    [provider]  nitroGLYCERIN  (NITROSTAT ) 0.4 MG SL tablet Place 1 tablet (0.4 mg total) under the tongue every 5 (five) minutes as needed. 06/08/19   Parthenia Olivia HERO, PA-C  potassium chloride  (KLOR-CON  M) 10 MEQ tablet Take 10 mEq by mouth 2 (two) times daily.    [provider]  sacubitril -valsartan  (ENTRESTO ) 24-26 MG Take 1 tablet by mouth 2 (two) times daily. 07/04/22   Debera Jayson MATSU, MD  simvastatin  (ZOCOR ) 40 MG tablet TAKE 1 TABLET BY MOUTH EVERY DAY IN THE EVENING 05/22/22   Debera Jayson MATSU, MD      Allergies    Codeine    Review of Systems   Review of Systems  Constitutional:  Positive for appetite change. Negative for chills and fever.  Respiratory:  Negative for chest tightness and shortness of breath.   Cardiovascular:  Negative for chest pain.  Gastrointestinal:  Positive for diarrhea and vomiting. Negative for abdominal pain and nausea.  Genitourinary:  Negative for decreased urine volume.  Musculoskeletal:  Negative for arthralgias and back pain.  Skin:  Positive for wound.       Skin tear right elbow area.    Physical Exam Updated Vital Signs BP (!) 121/51   Pulse 75   Temp 98.9 F (37.2 C) (Oral)   Resp 18   Ht 5' 6 (1.676 m)   Wt 70.1 kg   SpO2 (!) 89%  BMI 24.94 kg/m  Physical Exam Vitals and nursing note reviewed.  Constitutional:      General: He is not in acute distress.    Appearance: Normal appearance. He is not ill-appearing or toxic-appearing.  HENT:     Mouth/Throat:     Mouth: Mucous membranes are moist.  Eyes:     Conjunctiva/sclera: Conjunctivae normal.     Pupils: Pupils are equal, round, and reactive to light.  Cardiovascular:     Rate and Rhythm: Normal rate and regular rhythm.     Pulses: Normal pulses.  Pulmonary:     Effort: Pulmonary effort is normal.  Abdominal:     Palpations: Abdomen is soft. There is no mass.     Tenderness: There is no abdominal tenderness. There is no guarding  or rebound.  Musculoskeletal:        General: Normal range of motion.     Right lower leg: No edema.     Left lower leg: Edema present.     Comments: Mild edema of the bilateral lower extremities.  No erythema.  No calf pain  Skin:    General: Skin is warm.     Capillary Refill: Capillary refill takes less than 2 seconds.     Comments: Patient has skin tear right elbow.  No active bleeding.  Tear appears old.  Area was bandaged prior to my exam.  Bandages were removed for evaluation  Neurological:     General: No focal deficit present.     Mental Status: He is alert.     ED Results / Procedures / Treatments   Labs (all labs ordered are listed, but only abnormal results are displayed) Labs Reviewed  COMPREHENSIVE METABOLIC PANEL - Abnormal; Notable for the following components:      Result Value   CO2 20 (*)    Glucose, Bld 133 (*)    BUN 43 (*)    Creatinine, Ser 1.79 (*)    Calcium  8.2 (*)    Total Protein 6.1 (*)    Albumin  3.4 (*)    Alkaline Phosphatase 37 (*)    GFR, Estimated 35 (*)    All other components within normal limits  CBC WITH DIFFERENTIAL/PLATELET - Abnormal; Notable for the following components:   RBC 3.84 (*)    Hemoglobin 12.3 (*)    HCT 38.5 (*)    MCV 100.3 (*)    Platelets 112 (*)    Neutro Abs 1.1 (*)    Monocytes Absolute 3.2 (*)    All other components within normal limits  RESP PANEL BY RT-PCR (RSV, FLU A&B, COVID)  RVPGX2  C DIFFICILE QUICK SCREEN W PCR REFLEX    LIPASE, BLOOD  MAGNESIUM   URINALYSIS, ROUTINE W REFLEX MICROSCOPIC    EKG EKG Interpretation Date/Time:  Tuesday June 18 2023 15:51:34 EST Ventricular Rate:  77 PR Interval:  208 QRS Duration:  182 QT Interval:  447 QTC Calculation: 506 R Axis:   -84  Text Interpretation: Atrial-sensed ventricular-paced rhythm No further analysis attempted due to paced rhythm Confirmed by Cleotilde Rogue ([id number]) on 06/18/2023 7:03:18 PM  Radiology CT ABDOMEN PELVIS WO CONTRAST Result  Date: 06/18/2023 CLINICAL DATA:  Abdominal distension. Nausea vomiting and diarrhea. Left adrenal mass. EXAM: CT ABDOMEN AND PELVIS WITHOUT CONTRAST TECHNIQUE: Multidetector CT imaging of the abdomen and pelvis was performed following the standard protocol without IV contrast. RADIATION DOSE REDUCTION: This exam was performed according to the departmental dose-optimization program which includes automated exposure control, adjustment of the  mA and/or kV according to patient size and/or use of iterative reconstruction technique. COMPARISON:  CT abdomen pelvis dated 12/13/2022. FINDINGS: Evaluation of this exam is limited in the absence of intravenous contrast. Lower chest: Partially visualized small left and trace right pleural effusions. Coronary vascular calcifications noted. No intra-abdominal free air or free fluid. Hepatobiliary: The liver is unremarkable. No biliary dilatation. Multiple stones in the gallbladder. No pericholecystic fluid or evidence of acute cholecystitis by CT. Pancreas: Fatty atrophy of the pancreas. No active inflammatory changes. Spleen: Normal in size without focal abnormality. Adrenals/Urinary Tract: The right adrenal gland is unremarkable. Decrease in the size of left adrenal mass measuring 5.7 x 5.4 cm (previously 6.0 x 5.7 cm). Right renal cysts better evaluated on the prior contrast enhanced CT. There is no hydronephrosis or nephrolithiasis on either side. The visualized ureters and urinary bladder appear unremarkable. Stomach/Bowel: There is sigmoid diverticulosis. There is no bowel obstruction or active inflammation. No CT evidence of acute appendicitis. Vascular/Lymphatic: Advanced aortoiliac atherosclerotic disease. The IVC is unremarkable. No portal venous gas. There is no adenopathy. Reproductive: The prostate and seminal vesicles are grossly unremarkable. No pelvic mass. Other: None Musculoskeletal: Osteopenia with degenerative changes of the spine. No acute osseous pathology.  IMPRESSION: 1. No acute intra-abdominal or pelvic pathology. 2. Sigmoid diverticulosis. No bowel obstruction. 3. Cholelithiasis. 4. Decrease in the size of left adrenal mass. 5. Partially visualized small left and trace right pleural effusions. 6.  Aortic Atherosclerosis (ICD10-I70.0). Electronically Signed   By: Vanetta Chou M.D.   On: 06/18/2023 16:38   DG Chest Portable 1 View Result Date: 06/18/2023 CLINICAL DATA:  Fevers and weakness EXAM: PORTABLE CHEST 1 VIEW COMPARISON:  Radiograph and CT 05/22/2022 FINDINGS: Stable cardiomediastinal silhouette. Aortic atherosclerotic calcification. Sternotomy and CABG. Left chest wall ICD. Similar interstitial prominence and patchy ill-defined airspace opacities. No large pleural effusion or pneumothorax. IMPRESSION: Similar interstitial prominence and patchy ill-defined airspace opacities which may be due to edema or infection. Electronically Signed   By: Norman Gatlin M.D.   On: 06/18/2023 15:06    Procedures Procedures    Medications Ordered in ED Medications  ondansetron  (ZOFRAN ) injection 4 mg (4 mg Intravenous Given 06/18/23 1443)    ED Course/ Medical Decision Making/ A&P                                 Medical Decision Making Patient here from local SNF for evaluation of generalized weakness decreased appetite decreased urine output and diarrhea.  No reported fever.  No recent antibiotic use. Per caregiver at facility no recent fall or fever.  Diarrhea has been reported for several days watery and green in color.  No black or bloody stools  I suspect patient's symptoms are viral.  Possibly dehydrated.  Acute surgical abdomen also considered along with C. difficile.  I feel these are less likely given the lack of abdominal findings on exam.  He is a nursing home patient so we will obtain C. difficile panel if the diarrhea continues   Amount and/or Complexity of Data Reviewed Labs: ordered.    Details: Labs interpreted by me, no  leukocytosis, magnesium  and lipase are unremarkable.  Chemistries show some decreased kidney function but at baseline.  Respiratory panel negative  C. difficile ordered but patient unable to provide stool sample Radiology: ordered.    Details: Chest x-ray with patchy ill-defined airspace opacities likely due to edema versus infection.  No  large pleural effusion.  CT abdomen and pelvis ordered, no acute intra-abdominal or pelvic pathology.  No evidence of SBO Discussion of management or test interpretation with external provider(s): Patient also seen by Dr. Jakie and care plan discussed.  C. difficile panel pending CT abdomen and pelvis also ordered.  CT abdomen and pelvis reassuring today.  No emergent findings.  Patient has been observed in the emergency department, no vomiting or diarrhea.  Doubt C. difficile infection. his abdomen is soft and nontender on exam.  His respiratory panel today is negative.  He is tolerating oral fluids here.  Feel he is appropriate for discharge back to facility.  Nursing staff later called back to for update on the patient after my initial exam and stated that patient did have a witnessed fall and fell up against something causing the skin tear to his elbow.  Facility denies any head injury or LOC.  This area was cleaned and bandaged here.  X-ray of the elbow without evidence of fracture or dislocation.  Patient appropriate for discharge back to facility.   Risk Prescription drug management.           Final Clinical Impression(s) / ED Diagnoses Final diagnoses:  Diarrhea, unspecified type  Skin tear of right elbow without complication, initial encounter    Rx / DC Orders ED Discharge Orders     None         Herlinda Madelin RIGGERS 06/18/23 2258    Yolande Lamar BROCKS, MD 06/20/23 [id number]

## 2023-06-18 NOTE — ED Notes (Signed)
Patience from [facility] called and stated that patient fell last night and hurt his elbow and has a skin tear, it was a witnessed fall by the RA. Patience said that patient should not have been up on his feet walking that he is to unstable.

## 2023-06-18 NOTE — Discharge Instructions (Signed)
 Continue to drink fluids.  Brat diet (bananas rice applesauce and toast) will help with the diarrhea.  Keep the area clean and bandaged to your right elbow.  Your CT scan of your abdomen and pelvis was reassuring.  X-ray of your elbow did not show any broken bones.  Please call your primary care provider this week to arrange follow-up appointment.  Return to emergency department for any new or worsening symptoms.

## 2023-06-21 DIAGNOSIS — L03119 Cellulitis of unspecified part of limb: Secondary | ICD-10-CM | POA: Diagnosis not present

## 2023-06-21 DIAGNOSIS — L03116 Cellulitis of left lower limb: Secondary | ICD-10-CM | POA: Diagnosis not present

## 2023-06-22 ENCOUNTER — Emergency Department (HOSPITAL_COMMUNITY): Payer: Medicare Other

## 2023-06-22 ENCOUNTER — Other Ambulatory Visit: Payer: Self-pay

## 2023-06-22 ENCOUNTER — Emergency Department (HOSPITAL_COMMUNITY)
Admission: EM | Admit: 2023-06-22 | Discharge: 2023-06-23 | Disposition: A | Discharge status: Home / Self Care | Payer: Medicare Other | Attending: Emergency Medicine | Admitting: Emergency Medicine

## 2023-06-22 ENCOUNTER — Encounter (HOSPITAL_COMMUNITY): Payer: Self-pay

## 2023-06-22 DIAGNOSIS — R7989 Other specified abnormal findings of blood chemistry: Secondary | ICD-10-CM | POA: Diagnosis not present

## 2023-06-22 DIAGNOSIS — W19XXXA Unspecified fall, initial encounter: Secondary | ICD-10-CM | POA: Diagnosis not present

## 2023-06-22 DIAGNOSIS — M79631 Pain in right forearm: Secondary | ICD-10-CM | POA: Diagnosis not present

## 2023-06-22 DIAGNOSIS — S0990XA Unspecified injury of head, initial encounter: Secondary | ICD-10-CM | POA: Insufficient documentation

## 2023-06-22 DIAGNOSIS — S0083XA Contusion of other part of head, initial encounter: Secondary | ICD-10-CM | POA: Diagnosis not present

## 2023-06-22 DIAGNOSIS — Z95810 Presence of automatic (implantable) cardiac defibrillator: Secondary | ICD-10-CM | POA: Diagnosis not present

## 2023-06-22 DIAGNOSIS — I959 Hypotension, unspecified: Secondary | ICD-10-CM | POA: Diagnosis not present

## 2023-06-22 DIAGNOSIS — M4802 Spinal stenosis, cervical region: Secondary | ICD-10-CM | POA: Diagnosis not present

## 2023-06-22 DIAGNOSIS — I63233 Cerebral infarction due to unspecified occlusion or stenosis of bilateral carotid arteries: Secondary | ICD-10-CM | POA: Diagnosis not present

## 2023-06-22 DIAGNOSIS — M25511 Pain in right shoulder: Secondary | ICD-10-CM | POA: Diagnosis not present

## 2023-06-22 DIAGNOSIS — M79671 Pain in right foot: Secondary | ICD-10-CM | POA: Diagnosis not present

## 2023-06-22 DIAGNOSIS — M5030 Other cervical disc degeneration, unspecified cervical region: Secondary | ICD-10-CM | POA: Diagnosis not present

## 2023-06-22 DIAGNOSIS — I10 Essential (primary) hypertension: Secondary | ICD-10-CM | POA: Diagnosis not present

## 2023-06-22 DIAGNOSIS — M8580 Other specified disorders of bone density and structure, unspecified site: Secondary | ICD-10-CM | POA: Diagnosis not present

## 2023-06-22 DIAGNOSIS — E875 Hyperkalemia: Secondary | ICD-10-CM | POA: Diagnosis not present

## 2023-06-22 DIAGNOSIS — M160 Bilateral primary osteoarthritis of hip: Secondary | ICD-10-CM | POA: Diagnosis not present

## 2023-06-22 DIAGNOSIS — Z043 Encounter for examination and observation following other accident: Secondary | ICD-10-CM | POA: Diagnosis not present

## 2023-06-22 DIAGNOSIS — M79651 Pain in right thigh: Secondary | ICD-10-CM | POA: Diagnosis not present

## 2023-06-22 DIAGNOSIS — S199XXA Unspecified injury of neck, initial encounter: Secondary | ICD-10-CM | POA: Diagnosis not present

## 2023-06-22 DIAGNOSIS — Z23 Encounter for immunization: Secondary | ICD-10-CM | POA: Insufficient documentation

## 2023-06-22 DIAGNOSIS — M19031 Primary osteoarthritis, right wrist: Secondary | ICD-10-CM | POA: Diagnosis not present

## 2023-06-22 DIAGNOSIS — M25531 Pain in right wrist: Secondary | ICD-10-CM | POA: Diagnosis not present

## 2023-06-22 DIAGNOSIS — I700 Atherosclerosis of aorta: Secondary | ICD-10-CM | POA: Diagnosis not present

## 2023-06-22 DIAGNOSIS — M1811 Unilateral primary osteoarthritis of first carpometacarpal joint, right hand: Secondary | ICD-10-CM | POA: Diagnosis not present

## 2023-06-22 DIAGNOSIS — Z7901 Long term (current) use of anticoagulants: Secondary | ICD-10-CM | POA: Insufficient documentation

## 2023-06-22 DIAGNOSIS — M25519 Pain in unspecified shoulder: Secondary | ICD-10-CM | POA: Diagnosis not present

## 2023-06-22 DIAGNOSIS — R609 Edema, unspecified: Secondary | ICD-10-CM | POA: Diagnosis not present

## 2023-06-22 LAB — CBC WITH DIFFERENTIAL/PLATELET
Abs Immature Granulocytes: 0.08 10*3/uL — ABNORMAL HIGH (ref 0.00–0.07)
Basophils Absolute: 0 10*3/uL (ref 0.0–0.1)
Basophils Relative: 0 %
Eosinophils Absolute: 0 10*3/uL (ref 0.0–0.5)
Eosinophils Relative: 0 %
HCT: 35.8 % — ABNORMAL LOW (ref 39.0–52.0)
Hemoglobin: 11.7 g/dL — ABNORMAL LOW (ref 13.0–17.0)
Immature Granulocytes: 2 %
Lymphocytes Relative: 15 %
Lymphs Abs: 0.7 10*3/uL (ref 0.7–4.0)
MCH: 31.9 pg (ref 26.0–34.0)
MCHC: 32.7 g/dL (ref 30.0–36.0)
MCV: 97.5 fL (ref 80.0–100.0)
Monocytes Absolute: 1.3 10*3/uL — ABNORMAL HIGH (ref 0.1–1.0)
Monocytes Relative: 27 %
Neutro Abs: 2.7 10*3/uL (ref 1.7–7.7)
Neutrophils Relative %: 56 %
Platelets: 120 10*3/uL — ABNORMAL LOW (ref 150–400)
RBC: 3.67 MIL/uL — ABNORMAL LOW (ref 4.22–5.81)
RDW: 13.8 % (ref 11.5–15.5)
WBC: 4.8 10*3/uL (ref 4.0–10.5)
nRBC: 0 % (ref 0.0–0.2)

## 2023-06-22 LAB — COMPREHENSIVE METABOLIC PANEL
ALT: 11 U/L (ref 0–44)
AST: 18 U/L (ref 15–41)
Albumin: 3.2 g/dL — ABNORMAL LOW (ref 3.5–5.0)
Alkaline Phosphatase: 38 U/L (ref 38–126)
Anion gap: 12 (ref 5–15)
BUN: 43 mg/dL — ABNORMAL HIGH (ref 8–23)
CO2: 18 mmol/L — ABNORMAL LOW (ref 22–32)
Calcium: 8 mg/dL — ABNORMAL LOW (ref 8.9–10.3)
Chloride: 108 mmol/L (ref 98–111)
Creatinine, Ser: 1.9 mg/dL — ABNORMAL HIGH (ref 0.61–1.24)
GFR, Estimated: 32 mL/min — ABNORMAL LOW (ref >60)
Glucose, Bld: 171 mg/dL — ABNORMAL HIGH (ref 70–99)
Potassium: 5.3 mmol/L — ABNORMAL HIGH (ref 3.5–5.1)
Sodium: 138 mmol/L (ref 135–145)
Total Bilirubin: 1.2 mg/dL (ref 0.0–1.2)
Total Protein: 6.4 g/dL — ABNORMAL LOW (ref 6.5–8.1)

## 2023-06-22 LAB — URINALYSIS, ROUTINE W REFLEX MICROSCOPIC
Bacteria, UA: NONE SEEN
Bilirubin Urine: NEGATIVE
Glucose, UA: 50 mg/dL — AB
Ketones, ur: 20 mg/dL — AB
Leukocytes,Ua: NEGATIVE
Nitrite: NEGATIVE
Protein, ur: >=300 mg/dL — AB
Specific Gravity, Urine: 1.022 (ref 1.005–1.030)
pH: 5 (ref 5.0–8.0)

## 2023-06-22 LAB — CK: Total CK: 307 U/L (ref 49–397)

## 2023-06-22 LAB — TROPONIN I (HIGH SENSITIVITY)
Troponin I (High Sensitivity): 79 ng/L — ABNORMAL HIGH (ref <18)
Troponin I (High Sensitivity): 78 ng/L — ABNORMAL HIGH (ref <18)

## 2023-06-22 LAB — MAGNESIUM: Magnesium: 2.1 mg/dL (ref 1.7–2.4)

## 2023-06-22 MED ORDER — ONDANSETRON HCL 4 MG/2ML IJ SOLN: 4.0000 mg | Freq: Four times a day (QID) | INTRAMUSCULAR | Status: DC | PRN

## 2023-06-22 MED ORDER — MORPHINE SULFATE (PF) 4 MG/ML IV SOLN: 2.0000 mg | Freq: Once | INTRAVENOUS | Status: DC

## 2023-06-22 MED ORDER — SODIUM ZIRCONIUM CYCLOSILICATE 5 G PO PACK
10.0000 g | PACK | Freq: Once | ORAL | Status: AC
  Administered 2023-06-22: 10 g via ORAL
  Filled 2023-06-22: qty 2

## 2023-06-22 MED ORDER — TETANUS-DIPHTH-ACELL PERTUSSIS 5-2.5-18.5 LF-MCG/0.5 IM SUSY
0.5000 mL | PREFILLED_SYRINGE | Freq: Once | INTRAMUSCULAR | Status: AC
  Administered 2023-06-22: 0.5 mL via INTRAMUSCULAR
  Filled 2023-06-22: qty 0.5

## 2023-06-22 NOTE — ED Provider Notes (Signed)
 [facility] EMERGENCY DEPARTMENT AT [facility] Provider Note   CSN: [id number] Arrival date & time: 06/22/23  1324     History  Chief Complaint  Patient presents with   [name] is a 89 y.o. male with PMH as listed below who presents with BIB [facility] due to unwitnessed fall at [place]. Unknown how long pt was on floor. Pt has obvious injury to right side head and is complaining of R shoulder pain. Pt had fall last week as well and has obvious bruising to right arm from previous fall. Pt is on eliquis . . Patient states he does not remember what happened to him today.  He endorses pain in his right upper leg as well as his right wrist.  He denies any head, neck, back pain.  Past Medical History:  Diagnosis Date   BPH (benign prostatic hypertrophy)    CHF (congestive heart failure) (HCC)    a. EF at 45-50% in 2016 b. EF at 40-45% by repeat echo in 12/2020   Chronic systolic heart failure (HCC)    Complete heart block [facility])    Medtronic pacemaker 04/2021 - Dr. Waddell   Coronary artery disease    a. Multivessel status post CABG 8/06, LIMA-LAD, SVG-CFX, SVG-distal RCA b. 12/2020: cath showing 3/3 patent grafts with 80% stenosis along LPAV prior to bifurication and medical management recommended.   Essential hypertension    Hyperlipidemia    Ischemic cardiomyopathy    Myelodysplasia, low grade (HCC) 11/27/2015   Nephrolithiasis    Prostate cancer (HCC)    Vitamin B 12 deficiency 07/27/2016       Home Medications Prior to Admission medications   Medication Sig Start Date End Date Taking? Authorizing Provider  apixaban  (ELIQUIS ) 2.5 MG TABS tablet Take 1 tablet (2.5 mg total) by mouth 2 (two) times daily. 09/04/22   Waddell Danelle ORN, MD  carvedilol  (COREG ) 12.5 MG tablet TAKE 1 TABLET(12.5 MG) BY MOUTH TWICE DAILY WITH A MEAL 04/09/22   Debera Jayson MATSU, MD  docusate sodium  (COLACE) 100 MG capsule Take 100 mg by mouth 2 (two) times daily.    [provider]  ferrous sulfate  325 (65 FE) MG EC tablet Take 325 mg by mouth 3 (three) times daily with meals.    [provider]  furosemide  (LASIX ) 40 MG tablet TAKE 1 TABLET(40 MG) BY MOUTH DAILY Patient taking differently: Take 20 mg by mouth daily. 07/28/21   Debera Jayson MATSU, MD  insulin  degludec (TRESIBA FLEXTOUCH) 100 UNIT/ML FlexTouch Pen Inject 10 Units into the skin daily.    [provider]  nitroGLYCERIN  (NITROSTAT ) 0.4 MG SL tablet Place 1 tablet (0.4 mg total) under the tongue every 5 (five) minutes as needed. 06/08/19   Parthenia Olivia HERO, PA-C  potassium chloride  (KLOR-CON  M) 10 MEQ tablet Take 10 mEq by mouth 2 (two) times daily.    [provider]  sacubitril -valsartan  (ENTRESTO ) 24-26 MG Take 1 tablet by mouth 2 (two) times daily. 07/04/22   Debera Jayson MATSU, MD  simvastatin  (ZOCOR ) 40 MG tablet TAKE 1 TABLET BY MOUTH EVERY DAY IN THE EVENING 05/22/22   Debera Jayson MATSU, MD      Allergies    Codeine    Review of Systems   Review of Systems A 10 point review of systems was performed and is negative unless otherwise reported in HPI.  Physical Exam Updated Vital Signs Ht 5' 6 (1.676 m)   Wt 70.1 kg  BMI 24.94 kg/m  Physical Exam  PRIMARY SURVEY  Airway Airway intact  Breathing Bilateral breath sounds  Circulation Carotid/femoral pulses 2+ intact bilaterally  GCS E =  4 V =  4 M =  6 Total = 14  Environment All clothes removed      SECONDARY SURVEY  Gen: -NAD  HEENT: -Head: NCAT. Skull is clear of deformities or depressions -Forehead: 5x5 cm hematoma with overlying abrasion to R forehead without skull depression -Midface: Stable -Eyes: No visible injury to eyelids or eye, PERRL, EOMI -Nose: No gross deformities -Mouth: No injuries to lips, tongue or teeth. No trismus or malposition -Ears: Nno auricular hematoma -Neck: Trachea is midline, no distended neck veins  Chest: -No tenderness, deformities, bruising or crepitus to  clavicles or chest -Normal chest expansion -Normal heart sounds, S1/S2 normal, no m/r/g -No wheezes, rales, rhonchi  Abdomen: -No tenderness, bruising or penetrating injury  Pelvis: -Pelvis is stable and non-tender  Extremities: Left Upper Extremity: -No point tenderness, deformity or other signs of injury -Radial pulse intact RUE, cap refill good -Normal sensation -Normal ROM, good strength Right Upper Extremity: -TTP and swelling to dorsal wrist with limited ROM of the wrist -Chronic appearing wounds on right elbow -No other deformity or other signs of injury -Radial pulse intact LUE, cap refill good -Normal sensation Right Lower Extremity: -Erythema/point tenderness to palpation to R great toe MCP joint -TTP without deformity or swelling to right upper thigh, groin area. No signs of trauma.  -No point tenderness, deformity or other signs of injury -DP intact RLE -Normal sensation -Good ROM, good strength Left Lower Extremity: -No point tenderness, deformity or other signs of injury -DP intact LLE -Normal sensation -Normal ROM, good strength  Back/Spine: -+midline C spine TTP without stepoffs or deformities. No midline T or L spine TTP or step-offs   Other: N/A     ED Results / Procedures / Treatments   Labs (all labs ordered are listed, but only abnormal results are displayed) Labs Reviewed  CBC WITH DIFFERENTIAL/PLATELET - Abnormal; Notable for the following components:      Result Value   RBC 3.67 (*)    Hemoglobin 11.7 (*)    HCT 35.8 (*)    Platelets 120 (*)    Monocytes Absolute 1.3 (*)    Abs Immature Granulocytes 0.08 (*)    All other components within normal limits  COMPREHENSIVE METABOLIC PANEL - Abnormal; Notable for the following components:   Potassium 5.3 (*)    CO2 18 (*)    Glucose, Bld 171 (*)    BUN 43 (*)    Creatinine, Ser 1.90 (*)    Calcium  8.0 (*)    Total Protein 6.4 (*)    Albumin  3.2 (*)    GFR, Estimated 32 (*)    All other  components within normal limits  URINALYSIS, ROUTINE W REFLEX MICROSCOPIC - Abnormal; Notable for the following components:   Color, Urine AMBER (*)    APPearance CLOUDY (*)    Glucose, UA 50 (*)    Hgb urine dipstick MODERATE (*)    Ketones, ur 20 (*)    Protein, ur >=300 (*)    All other components within normal limits  TROPONIN I (HIGH SENSITIVITY) - Abnormal; Notable for the following components:   Troponin I (High Sensitivity) 79 (*)    All other components within normal limits  TROPONIN I (HIGH SENSITIVITY) - Abnormal; Notable for the following components:   Troponin I (High Sensitivity) 78 (*)  All other components within normal limits  CK  MAGNESIUM     EKG EKG Interpretation Date/Time:  Saturday June 22 2023 14:05:50 EST Ventricular Rate:  70 PR Interval:  74 QRS Duration:  193 QT Interval:  490 QTC Calculation: 529 R Axis:   268  Text Interpretation: A-V dual-paced rhythm with some inhibition  Similar to prior Confirmed by Franklyn Gills [phone]) on 06/22/2023 2:39:10 PM  Radiology No results found.  Procedures Procedures    Medications Ordered in ED Medications  Tdap (BOOSTRIX ) injection 0.5 mL (0.5 mLs Intramuscular Given 06/22/23 2107)  sodium zirconium cyclosilicate  (LOKELMA ) packet 10 g (10 g Oral Given 06/22/23 2107)    ED Course/ Medical Decision Making/ A&P                          Medical Decision Making Amount and/or Complexity of Data Reviewed Labs: ordered. Decision-making details documented in ED Course. Radiology: ordered. Decision-making details documented in ED Course.  Risk Prescription drug management.    This patient presents to the ED for concern of fall at facility, this involves an extensive number of treatment options, and is a complaint that carries with it a high risk of complications and morbidity.  I considered the following differential and admission for this acute, potentially life threatening condition.   MDM:    DDX  for trauma includes but is not limited to:  -Head Injury such as skull fx or ICH -Vertebral injury -Fractures to R wrist, R hip/leg, or R foot. Redness/TTP at R MCP foot appears like gout. Per chart review does have h/o gout but doesn't seem to take medication -unclear why he fell, consider syncope or mechanical fall. Denies CP, less likely ACS, but will get troponin to evaluate. Also unclear how long he laid on the ground, consider rhabdo.    Clinical Course as of 06/27/23 2352  Sat Jun 22, 2023  1438 DG Pelvis 1-2 Views 1. No acute fracture or dislocation. 2. Osteopenia.   [HN]  1438 DG Chest Portable 1 View No active disease [HN]  1439 CT Head Wo Contrast 1. Right supraorbital scalp hematoma without underlying fracture or foreign body. 2. No acute intracranial abnormality or significant interval change. 3. Moderate atrophy and diffuse white matter disease likely reflects the sequela of chronic microvascular ischemia. 4. 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 5. Mild ethmoid and frontal sinus disease.   [HN]  1453 CT Cervical Spine Wo Contrast 1. No acute or healing fractures of the cervical spine. 2. Multilevel degenerative disc disease. 3. Right greater than left foraminal narrowing at C4-5, C5-6 and C6-7.   [HN]  1533 CK Total: 307 wnl [HN]  1534 Creatinine(!): 1.90 Mildly worsened compared to prior, around 1.7 [SG]  1534 DG Wrist Complete Right 1. No acute displaced fracture. 2. Dorsal soft tissue swelling. 3. Osteoarthritis, greatest at the base of thumb.   [HN]  1534 Potassium(!): 5.3 Mild elevation [SG]  1535 DG Foot Complete Right 1. No acute displaced fracture. [HN]  1736 Troponin I (High Sensitivity)(!): 78 Delta trop is down trending, he has no chest pain [SG]  2018 Trop incidentially found to be elevated, delta is flat, EKG w/o ischemia. No chest pain, no LOC. He has fairly extensive cardiac hx, he is on DOAC. Will touch base with  cardiology given elev trop.  [SG]    Clinical Course User Index [HN] Franklyn Gills SAILOR, MD [SG] Elnor Jayson LABOR, DO  Labs: I Ordered, and personally interpreted labs.  The pertinent results include:  those listed above  Imaging Studies ordered: I ordered imaging studies including CTH, CT C-spine, PXR, CXR, and extremity XRs I independently visualized and interpreted imaging. I agree with the radiologist interpretation  Additional history obtained from EMS, chart review.   Cardiac Monitoring: The patient was maintained on a cardiac monitor.  I personally viewed and interpreted the cardiac monitored which showed an underlying rhythm of: Paced rhythm  Reevaluation: After the interventions noted above, I reevaluated the patient and found that they have :stayed the same  Social Determinants of Health: Lives at [place]  Disposition:  Patient is signed out to the oncoming ED physician Dr. Elnor who is made aware of his history, presentation, exam, workup, and plan.    Co morbidities that complicate the patient evaluation  Past Medical History:  Diagnosis Date   BPH (benign prostatic hypertrophy)    CHF (congestive heart failure) (HCC)    a. EF at 45-50% in 2016 b. EF at 40-45% by repeat echo in 12/2020   Chronic systolic heart failure (HCC)    Complete heart block [facility])    Medtronic pacemaker 04/2021 - Dr. Waddell   Coronary artery disease    a. Multivessel status post CABG 8/06, LIMA-LAD, SVG-CFX, SVG-distal RCA b. 12/2020: cath showing 3/3 patent grafts with 80% stenosis along LPAV prior to bifurication and medical management recommended.   Essential hypertension    Hyperlipidemia    Ischemic cardiomyopathy    Myelodysplasia, low grade (HCC) 11/27/2015   Nephrolithiasis    Prostate cancer (HCC)    Vitamin B 12 deficiency 07/27/2016     Medicines No orders of the defined types were placed in this encounter.   I have reviewed the patients home medicines and have made  adjustments as needed  Problem List / ED Course: Problem List Items Addressed This Visit   None Visit Diagnoses       Fall, initial encounter    -  Primary     Injury of head, initial encounter         Troponin level elevated       Relevant Orders   Ambulatory referral to Cardiology     Hyperkalemia                       This note was created using dictation software, which may contain spelling or grammatical errors.    Franklyn Sid SAILOR, MD 06/27/23 [phone]

## 2023-06-22 NOTE — Treatment Plan (Signed)
 Called by [facility] provider for elevated troponin. Pt had suspected mechanical fall at home (tripped up in blankets), no reports of chest pain prior to arrival or currently, troponin was elevated at 79 and repeat at 78 (2 hour). ECG is paced. Suspect elevated troponin 2/2 demand ischemia, would not recommend additional work up at this time.

## 2023-06-22 NOTE — Discharge Instructions (Addendum)
 Based on the events which brought you to the ER today, it is possible that you may have a concussion. A concussion occurs when there is a blow to the head or body, with enough force to shake the brain and disrupt how the brain functions. You may experience symptoms such as headaches, sensitivity to light/noise, dizziness, cognitive slowing, difficulty concentrating / remembering, trouble sleeping and drowsiness. These symptoms may last anywhere from hours/days to potentially weeks/months. While these symptoms are very frustrating and perhaps debilitating, it is important that you remember that they will improve over time. Everyone has a different rate of recovery; it is difficult to predict when your symptoms will resolve. In order to allow for your brain to heal after the injury, we recommend that you see your primary physician or a physician knowledgeable in concussion management. We also advise you to let your body and brain rest: avoid physical activities (sports, gym, and exercise) and reduce cognitive demands (reading, texting, TV watching, computer use, video games, etc). School attendance, after-school activities and work may need to be modified to avoid increasing symptoms. We recommend against driving until until all symptoms have resolved. You should take 650mg  of Acetaminophen  (Tylenol ) every 4 hours as needed for pain control; however, taking anti-inflammatory medication (Motrin/Advil/Ibuprofen) is not advised. Come back to the ER right away if you are having repeated episodes of vomiting, severe/worsening headache/dizziness or any other symptom that alarms you. We recommended that someone stay with you for the next 24 hours to monitor for these worrisome symptoms.   Your potassium was mildly elevated, you are given medication to reduce your potassium.  Please follow-up with your PCP for recheck your potassium in the next week     It was a pleasure caring for you today in the emergency  department.  Please return to the emergency department for any worsening or worrisome symptoms.

## 2023-06-22 NOTE — ED Triage Notes (Signed)
 Pt BIB [facility] due to unwitnessed fall at [facility]. Unknown how long pt was on floor. Pt has obvious injury to right side head and is complaining of R shoulder pain. Pt had fall last week as well and has obvious bruising to right arm from previous fall. Pt is on blood thinner

## 2023-06-22 NOTE — ED Provider Notes (Addendum)
 Provider Note MRN:  [id number]  Arrival date & time: 06/22/23    ED Course and Medical Decision Making  Assumed care from Dr Franklyn at shift change.  See note from prior team for complete details, in brief:  89 yo male hx CHB s/p ppm, afib on DOAC He had a fall at SNF, family tripped over some blankets on the floor.  Pain to the right side of his torso.  Head injury. Labs and imaging ordered by prior team  Plan per prior physician follow-up on labs and imaging  Imaging was reviewed, no acute fractures. He has an abrasion to his forehead, no laceration, will update tetanus Labs reviewed, he has elevated creatinine similar to his baseline.  Also has hyperkalemia without EKG changes, give Lokelma .  Found to also have elevated troponin 79 delta 78, no chest pain, no syncope; EKG paced w/o acute ischemic changes Discussed with cardiology Dr Tobie, this is likely demand ischemia. Will have him follow-up in the office Patient reports he is feeling better. Concussion precautions provided Stable for discharge back to SNF  The patient improved significantly and was discharged in stable condition. Detailed discussions were had with the patient/guardian regarding current findings, and need for close f/u with PCP or on call doctor. The patient/guardian has been instructed to return immediately if the symptoms worsen in any way for re-evaluation. Patient/guardian verbalized understanding and is in agreement with current care plan. All questions answered prior to discharge.   CRITICAL CARE Performed by: Jayson DELENA Pereyra   Total critical care time: 32 minutes  Critical care time was exclusive of separately billable procedures and treating other patients.  Critical care was necessary to treat or prevent imminent or life-threatening deterioration.  Critical care was time spent personally by me on the following activities: development of treatment plan with patient and/or surrogate as well as nursing,  discussions with consultants, evaluation of patient's response to treatment, examination of patient, obtaining history from patient or surrogate, ordering and performing treatments and interventions, ordering and review of laboratory studies, ordering and review of radiographic studies, pulse oximetry and re-evaluation of patient's condition. Cardiac (trop elev)   Procedures  Final Clinical Impressions(s) / ED Diagnoses     ICD-10-CM   1. Fall, initial encounter  W19.XXXA     2. Injury of head, initial encounter  S09.90XA     3. Troponin level elevated  R79.89 Ambulatory referral to Cardiology    4. Hyperkalemia  E87.5       ED Discharge Orders          Ordered    Ambulatory referral to Cardiology        06/22/23 2012              Discharge Instructions      Based on the events which brought you to the ER today, it is possible that you may have a concussion. A concussion occurs when there is a blow to the head or body, with enough force to shake the brain and disrupt how the brain functions. You may experience symptoms such as headaches, sensitivity to light/noise, dizziness, cognitive slowing, difficulty concentrating / remembering, trouble sleeping and drowsiness. These symptoms may last anywhere from hours/days to potentially weeks/months. While these symptoms are very frustrating and perhaps debilitating, it is important that you remember that they will improve over time. Everyone has a different rate of recovery; it is difficult to predict when your symptoms will resolve. In order to allow for your brain  to heal after the injury, we recommend that you see your primary physician or a physician knowledgeable in concussion management. We also advise you to let your body and brain rest: avoid physical activities (sports, gym, and exercise) and reduce cognitive demands (reading, texting, TV watching, computer use, video games, etc). School attendance, after-school activities and work  may need to be modified to avoid increasing symptoms. We recommend against driving until until all symptoms have resolved. You should take 650mg  of Acetaminophen  (Tylenol ) every 4 hours as needed for pain control; however, taking anti-inflammatory medication (Motrin/Advil/Ibuprofen) is not advised. Come back to the ER right away if you are having repeated episodes of vomiting, severe/worsening headache/dizziness or any other symptom that alarms you. We recommended that someone stay with you for the next 24 hours to monitor for these worrisome symptoms.   Your potassium was mildly elevated, you are given medication to reduce your potassium.  Please follow-up with your PCP for recheck your potassium in the next week     It was a pleasure caring for you today in the emergency department.  Please return to the emergency department for any worsening or worrisome symptoms.           Elnor Jayson LABOR, DO 06/22/23 2101    Elnor Jayson LABOR, DO 06/22/23 2102

## 2023-06-23 DIAGNOSIS — I959 Hypotension, unspecified: Secondary | ICD-10-CM | POA: Diagnosis not present

## 2023-06-23 DIAGNOSIS — Z7401 Bed confinement status: Secondary | ICD-10-CM | POA: Diagnosis not present

## 2023-06-25 ENCOUNTER — Ambulatory Visit: Payer: Medicare Other | Admitting: Cardiology

## 2023-06-25 DIAGNOSIS — E538 Deficiency of other specified B group vitamins: Secondary | ICD-10-CM | POA: Diagnosis not present

## 2023-06-25 DIAGNOSIS — T148XXA Other injury of unspecified body region, initial encounter: Secondary | ICD-10-CM | POA: Diagnosis not present

## 2023-06-25 DIAGNOSIS — M2550 Pain in unspecified joint: Secondary | ICD-10-CM | POA: Diagnosis not present

## 2023-06-25 DIAGNOSIS — E875 Hyperkalemia: Secondary | ICD-10-CM | POA: Diagnosis not present

## 2023-06-25 DIAGNOSIS — N1831 Chronic kidney disease, stage 3a: Secondary | ICD-10-CM | POA: Diagnosis not present

## 2023-06-25 DIAGNOSIS — M109 Gout, unspecified: Secondary | ICD-10-CM | POA: Diagnosis not present

## 2023-06-25 DIAGNOSIS — R519 Headache, unspecified: Secondary | ICD-10-CM | POA: Diagnosis not present

## 2023-06-26 DIAGNOSIS — Z87891 Personal history of nicotine dependence: Secondary | ICD-10-CM | POA: Diagnosis not present

## 2023-06-26 DIAGNOSIS — Z8616 Personal history of COVID-19: Secondary | ICD-10-CM | POA: Diagnosis not present

## 2023-06-26 DIAGNOSIS — Z794 Long term (current) use of insulin: Secondary | ICD-10-CM | POA: Diagnosis not present

## 2023-06-26 DIAGNOSIS — Z556 Problems related to health literacy: Secondary | ICD-10-CM | POA: Diagnosis not present

## 2023-06-26 DIAGNOSIS — I480 Paroxysmal atrial fibrillation: Secondary | ICD-10-CM | POA: Diagnosis not present

## 2023-06-26 DIAGNOSIS — N1831 Chronic kidney disease, stage 3a: Secondary | ICD-10-CM | POA: Diagnosis not present

## 2023-06-26 DIAGNOSIS — Z993 Dependence on wheelchair: Secondary | ICD-10-CM | POA: Diagnosis not present

## 2023-06-26 DIAGNOSIS — Z5982 Transportation insecurity: Secondary | ICD-10-CM | POA: Diagnosis not present

## 2023-06-26 DIAGNOSIS — Z9181 History of falling: Secondary | ICD-10-CM | POA: Diagnosis not present

## 2023-06-26 DIAGNOSIS — I130 Hypertensive heart and chronic kidney disease with heart failure and stage 1 through stage 4 chronic kidney disease, or unspecified chronic kidney disease: Secondary | ICD-10-CM | POA: Diagnosis not present

## 2023-06-26 DIAGNOSIS — I5022 Chronic systolic (congestive) heart failure: Secondary | ICD-10-CM | POA: Diagnosis not present

## 2023-06-26 DIAGNOSIS — I2510 Atherosclerotic heart disease of native coronary artery without angina pectoris: Secondary | ICD-10-CM | POA: Diagnosis not present

## 2023-06-26 DIAGNOSIS — Z7901 Long term (current) use of anticoagulants: Secondary | ICD-10-CM | POA: Diagnosis not present

## 2023-06-26 DIAGNOSIS — L03116 Cellulitis of left lower limb: Secondary | ICD-10-CM | POA: Diagnosis not present

## 2023-06-26 DIAGNOSIS — E1122 Type 2 diabetes mellitus with diabetic chronic kidney disease: Secondary | ICD-10-CM | POA: Diagnosis not present

## 2023-06-26 DIAGNOSIS — E875 Hyperkalemia: Secondary | ICD-10-CM | POA: Diagnosis not present

## 2023-06-26 DIAGNOSIS — Z604 Social exclusion and rejection: Secondary | ICD-10-CM | POA: Diagnosis not present

## 2023-07-01 DIAGNOSIS — L03116 Cellulitis of left lower limb: Secondary | ICD-10-CM | POA: Diagnosis not present

## 2023-07-01 DIAGNOSIS — N1831 Chronic kidney disease, stage 3a: Secondary | ICD-10-CM | POA: Diagnosis not present

## 2023-07-01 DIAGNOSIS — E1122 Type 2 diabetes mellitus with diabetic chronic kidney disease: Secondary | ICD-10-CM | POA: Diagnosis not present

## 2023-07-01 DIAGNOSIS — I480 Paroxysmal atrial fibrillation: Secondary | ICD-10-CM | POA: Diagnosis not present

## 2023-07-01 DIAGNOSIS — I130 Hypertensive heart and chronic kidney disease with heart failure and stage 1 through stage 4 chronic kidney disease, or unspecified chronic kidney disease: Secondary | ICD-10-CM | POA: Diagnosis not present

## 2023-07-01 DIAGNOSIS — I5022 Chronic systolic (congestive) heart failure: Secondary | ICD-10-CM | POA: Diagnosis not present

## 2023-07-02 DIAGNOSIS — N1831 Chronic kidney disease, stage 3a: Secondary | ICD-10-CM | POA: Diagnosis not present

## 2023-07-02 DIAGNOSIS — I5022 Chronic systolic (congestive) heart failure: Secondary | ICD-10-CM | POA: Diagnosis not present

## 2023-07-02 DIAGNOSIS — L03116 Cellulitis of left lower limb: Secondary | ICD-10-CM | POA: Diagnosis not present

## 2023-07-02 DIAGNOSIS — N1832 Chronic kidney disease, stage 3b: Secondary | ICD-10-CM | POA: Diagnosis not present

## 2023-07-02 DIAGNOSIS — E1122 Type 2 diabetes mellitus with diabetic chronic kidney disease: Secondary | ICD-10-CM | POA: Diagnosis not present

## 2023-07-02 DIAGNOSIS — I130 Hypertensive heart and chronic kidney disease with heart failure and stage 1 through stage 4 chronic kidney disease, or unspecified chronic kidney disease: Secondary | ICD-10-CM | POA: Diagnosis not present

## 2023-07-02 DIAGNOSIS — I480 Paroxysmal atrial fibrillation: Secondary | ICD-10-CM | POA: Diagnosis not present

## 2023-07-02 DIAGNOSIS — D631 Anemia in chronic kidney disease: Secondary | ICD-10-CM | POA: Diagnosis not present

## 2023-07-02 DIAGNOSIS — R809 Proteinuria, unspecified: Secondary | ICD-10-CM | POA: Diagnosis not present

## 2023-07-02 DIAGNOSIS — I10 Essential (primary) hypertension: Secondary | ICD-10-CM | POA: Diagnosis not present

## 2023-07-02 NOTE — Progress Notes (Signed)
 To assess for infection

## 2023-07-04 DIAGNOSIS — I130 Hypertensive heart and chronic kidney disease with heart failure and stage 1 through stage 4 chronic kidney disease, or unspecified chronic kidney disease: Secondary | ICD-10-CM | POA: Diagnosis not present

## 2023-07-04 DIAGNOSIS — I5022 Chronic systolic (congestive) heart failure: Secondary | ICD-10-CM | POA: Diagnosis not present

## 2023-07-04 DIAGNOSIS — L03116 Cellulitis of left lower limb: Secondary | ICD-10-CM | POA: Diagnosis not present

## 2023-07-04 DIAGNOSIS — I480 Paroxysmal atrial fibrillation: Secondary | ICD-10-CM | POA: Diagnosis not present

## 2023-07-04 DIAGNOSIS — E1122 Type 2 diabetes mellitus with diabetic chronic kidney disease: Secondary | ICD-10-CM | POA: Diagnosis not present

## 2023-07-04 DIAGNOSIS — N1831 Chronic kidney disease, stage 3a: Secondary | ICD-10-CM | POA: Diagnosis not present

## 2023-07-09 DIAGNOSIS — I5022 Chronic systolic (congestive) heart failure: Secondary | ICD-10-CM | POA: Diagnosis not present

## 2023-07-09 DIAGNOSIS — I130 Hypertensive heart and chronic kidney disease with heart failure and stage 1 through stage 4 chronic kidney disease, or unspecified chronic kidney disease: Secondary | ICD-10-CM | POA: Diagnosis not present

## 2023-07-09 DIAGNOSIS — I480 Paroxysmal atrial fibrillation: Secondary | ICD-10-CM | POA: Diagnosis not present

## 2023-07-09 DIAGNOSIS — N1831 Chronic kidney disease, stage 3a: Secondary | ICD-10-CM | POA: Diagnosis not present

## 2023-07-09 DIAGNOSIS — L03116 Cellulitis of left lower limb: Secondary | ICD-10-CM | POA: Diagnosis not present

## 2023-07-09 DIAGNOSIS — E1122 Type 2 diabetes mellitus with diabetic chronic kidney disease: Secondary | ICD-10-CM | POA: Diagnosis not present

## 2023-07-10 DIAGNOSIS — E875 Hyperkalemia: Secondary | ICD-10-CM | POA: Diagnosis not present

## 2023-07-10 DIAGNOSIS — M109 Gout, unspecified: Secondary | ICD-10-CM | POA: Diagnosis not present

## 2023-07-11 DIAGNOSIS — I5022 Chronic systolic (congestive) heart failure: Secondary | ICD-10-CM | POA: Diagnosis not present

## 2023-07-11 DIAGNOSIS — I130 Hypertensive heart and chronic kidney disease with heart failure and stage 1 through stage 4 chronic kidney disease, or unspecified chronic kidney disease: Secondary | ICD-10-CM | POA: Diagnosis not present

## 2023-07-11 DIAGNOSIS — I480 Paroxysmal atrial fibrillation: Secondary | ICD-10-CM | POA: Diagnosis not present

## 2023-07-11 DIAGNOSIS — E1122 Type 2 diabetes mellitus with diabetic chronic kidney disease: Secondary | ICD-10-CM | POA: Diagnosis not present

## 2023-07-11 DIAGNOSIS — N1831 Chronic kidney disease, stage 3a: Secondary | ICD-10-CM | POA: Diagnosis not present

## 2023-07-11 DIAGNOSIS — L03116 Cellulitis of left lower limb: Secondary | ICD-10-CM | POA: Diagnosis not present

## 2023-07-12 ENCOUNTER — Telehealth: Payer: Self-pay

## 2023-07-12 ENCOUNTER — Ambulatory Visit: Payer: Medicare Other | Attending: Medical | Admitting: Medical

## 2023-07-12 ENCOUNTER — Encounter: Payer: Self-pay | Admitting: Medical

## 2023-07-12 VITALS — BP 110/62 | HR 60 | Wt 158.4 lb

## 2023-07-12 DIAGNOSIS — I2510 Atherosclerotic heart disease of native coronary artery without angina pectoris: Secondary | ICD-10-CM | POA: Diagnosis not present

## 2023-07-12 DIAGNOSIS — R7989 Other specified abnormal findings of blood chemistry: Secondary | ICD-10-CM | POA: Insufficient documentation

## 2023-07-12 DIAGNOSIS — I442 Atrioventricular block, complete: Secondary | ICD-10-CM | POA: Insufficient documentation

## 2023-07-12 DIAGNOSIS — I5020 Unspecified systolic (congestive) heart failure: Secondary | ICD-10-CM | POA: Diagnosis not present

## 2023-07-12 DIAGNOSIS — N1832 Chronic kidney disease, stage 3b: Secondary | ICD-10-CM | POA: Diagnosis not present

## 2023-07-12 DIAGNOSIS — W19XXXD Unspecified fall, subsequent encounter: Secondary | ICD-10-CM | POA: Diagnosis not present

## 2023-07-12 NOTE — Telephone Encounter (Signed)
 Transmission received 07/12/2023.

## 2023-07-12 NOTE — Progress Notes (Signed)
 Cardiology Office Note:  .   Date:  07/12/2023  ID:  [name], DOB June 20, 1926, MRN [id number] PCP: Benita Stabile, MD  [facility] Providers Cardiologist:  Nona Dell, MD Electrophysiologist:  Lewayne Bunting, MD {  History of Present Illness: .   [name] is a 89 y.o. male with a h/o multivessel CAD (s/p CABG in 2006 with LIMA to LAD, SVG to Cx, SVG to RCA, repeat cath in 12/2020 showing 3/3 patent grafts and culprit 80% LPAV and medical management recommended), HFmrEF (EF 45-50% in 2016, 40-45% be repeat echo 12/2020), CHB s/p Medtronic PPM (previously followed by Dr. Ladona Ridgel and at [facility] in 2016 and not replaced due to improvement in his EF but developed CHB in 04/2021 and underwent gen change and device upgrade to DDD device), h/o prostate cancer, MDS,  paroxysmal Afib/flutter, CKD stage 3 who presents for follow-up.   He was last seen 12/2022 and stable from a cardiac perspective.   Seen in the ER 2/8 for a fall. Notes say it was an unwitnessed fall. HS trop 79>78, suspected demand ischemia. No acute fracture found. He was discharged back to SNF  Today, the patient has no complaints. He is in a wheelchair most of the time. He does not remember why he fell. He denies any chest pain, sob, lower leg edema. No recent fever or chills. He is eating and drinking normally.    Studies Reviewed: Marland Kitchen       LHC 12/2020   Prox RCA to Dist RCA lesion is 100% stenosed with 100% stenosed side branch in RV Branch.   Mid LM to Prox LAD lesion is 60% stenosed.   Prox Cx to Mid Cx lesion is 100% stenosed.   Prox LAD to Mid LAD lesion is 80% stenosed with 95% stenosed side branch in 2nd Diag.   Mid LAD lesion is 100% stenosed with 75% stenosed side branch in 3rd Diag.   ** LPAV lesion is 80% stenosed. -Fills via retrograde flow from SVG-OM 2.  Not approachable percutaneously.   LIMA-LAD and is normal in caliber.  The graft exhibits no disease.   SVG-2nd Mrg graft was visualized by angiography  and is large.  The graft exhibits no disease.   SVG-dRCA and is normal in caliber.  The graft exhibits no disease.   ----------------------   LV end diastolic pressure is moderately elevated.   There is no aortic valve stenosis.   SUMMARY Severe native CAD: 50-60% LEFT MAIN,  100% flush ostial RCA occlusion, 100 % proximal LCx occlusion after small OM1/RI,  Diffuse 80% proximal-mid followed by 100% mid LAD occlusion 3 of 3 patent grafts: LIMA-LAD, SVG-OM 2, SVG-RCA. Likely culprit lesion is 80% stenosis in the distal AVG LCx prior to bifurcation into LPL 1 and LPL 2 -> this fills via retrograde flow from the SVG-OM 2.  Not approachable from percutaneous option. Moderately elevated LAP.   RECOMMENDATIONS Optimize medical management.     Bryan Lemma, MD   Echo 12/2020  1. Poor acoustic windows.   2. Poor acoustic windows limit study Endocardium is not seen in all  segments even with DEFINITY There appears to be hypokinesis of the basal  inferior, basal inferoseptal, the inferolaterall and apical walls OVerall  LVEF appears mildly depressed LVEF 40  to 45%, again EF difficult given poor image quality. . The left  ventricular internal cavity size was severely dilated. There is mild left  ventricular hypertrophy. Left ventricular diastolic parameters are  indeterminate.   3. Device lead seen in RV to distal apex. Right ventricular systolic  function is normal. The right ventricular size is normal. There is  moderately elevated pulmonary artery systolic pressure.   4. Left atrial size was severely dilated.   5. The mitral valve is normal in structure. Mild mitral valve  regurgitation.   6. The aortic valve is tricuspid. Aortic valve regurgitation is not  visualized. Mild to moderate aortic valve sclerosis/calcification is  present, without any evidence of aortic stenosis.   7. The inferior vena cava is dilated in size with <50% respiratory  variability, suggesting right atrial  pressure of 15 mmHg.       Physical Exam:   VS:  BP 110/62   Pulse 60   Wt 158 lb 6.4 oz (71.8 kg)   SpO2 98%   BMI 25.57 kg/m    Wt Readings from Last 3 Encounters:  07/12/23 158 lb 6.4 oz (71.8 kg)  06/22/23 154 lb 8.7 oz (70.1 kg)  06/18/23 154 lb 8.7 oz (70.1 kg)    GEN: Well nourished, well developed in no acute distress NECK: No JVD; No carotid bruits CARDIAC: RRR, + murmur, no rubs, gallops RESPIRATORY:  Clear to auscultation without rales, wheezing or rhonchi  ABDOMEN: Soft, non-tender, non-distended EXTREMITIES:  No edema; No deformity   ASSESSMENT AND PLAN: .    Elevated troponin CAD with prior stenting HS troponin elevated to the 70s with flat trend in the setting of a fall with underlying CAD. He denies any anginal symptoms. He is mostly wheelchair bound. Can consider echo, however he will check with his sons first. Also reasonable to watch and wait given age, poor functional status and lack of symptoms. No ASA given Eliquis. Continue Coreg and Simvastatin  Fall He was in the ER 2/8 for unwitnessed fall. No fracture was found. I requested a remote device interrogation be sent in.   HFmrEF Echo 12/2020 showed LVEF 40-45%. He has trace lower leg edema. Continue coreg, Entresto and lasix with potassium  CHB s/p PPM He is followed by Dr. Ladona Ridgel. Last device check showed normal device.   Paroxysmal Afib/flutter Device interrogation shows low Afib burden. Continue Eliquis 2.5mg  BID for stroke ppx.   CKD stage 3 Scr 1.9 two weeks ago.    Dispo: follow-up in 1 month  Signed, Peggie Hornak [name] Stall, PA-C

## 2023-07-12 NOTE — Patient Instructions (Signed)
 Medication Instructions:  Your physician recommends that you continue on your current medications as directed. Please refer to the Current Medication list given to you today.  *If you need a refill on your cardiac medications before your next appointment, please call your pharmacy*   Lab Work: None If you have labs (blood work) drawn today and your tests are completely normal, you will receive your results only by: MyChart Message (if you have MyChart) OR A paper copy in the mail If you have any lab test that is abnormal or we need to change your treatment, we will call you to review the results.   Testing/Procedures: .isntecho    Follow-Up: At [facility], you and your health needs are our priority.  As part of our continuing mission to provide you with exceptional heart care, we have created designated Provider Care Teams.  These Care Teams include your primary Cardiologist (physician) and Advanced Practice Providers (APPs -  Physician Assistants and Nurse Practitioners) who all work together to provide you with the care you need, when you need it.  We recommend signing up for the patient portal called "MyChart".  Sign up information is provided on this After Visit Summary.  MyChart is used to connect with patients for Virtual Visits (Telemedicine).  Patients are able to view lab/test results, encounter notes, upcoming appointments, etc.  Non-urgent messages can be sent to your provider as well.   To learn more about what you can do with MyChart, go to [email or web address].    Your next appointment:    Keep follow up with Dr. Diona Browner  Other Instructions

## 2023-07-12 NOTE — Telephone Encounter (Signed)
 Remote transmission received. Normal device function. Presenting rhythm vs 60 bpm. 1 NSVT event 05/17/2023, < 20 beats in duration.

## 2023-07-12 NOTE — Telephone Encounter (Signed)
 Called to speak to patient/facility to send manual transmission. Facility states patient is not there at this time but when he arrives she will assist with sending a transmission.   Direct number left to call if they need assistance.

## 2023-07-12 NOTE — Telephone Encounter (Signed)
-----   Message from [facility] [place] [facility] sent at 07/12/2023  2:56 PM EST ----- Good afternoon. [name], [name] would like [name] device interrogated please- syncope.   Thank you

## 2023-07-15 DIAGNOSIS — L03116 Cellulitis of left lower limb: Secondary | ICD-10-CM | POA: Diagnosis not present

## 2023-07-15 DIAGNOSIS — I130 Hypertensive heart and chronic kidney disease with heart failure and stage 1 through stage 4 chronic kidney disease, or unspecified chronic kidney disease: Secondary | ICD-10-CM | POA: Diagnosis not present

## 2023-07-15 DIAGNOSIS — I480 Paroxysmal atrial fibrillation: Secondary | ICD-10-CM | POA: Diagnosis not present

## 2023-07-15 DIAGNOSIS — N1831 Chronic kidney disease, stage 3a: Secondary | ICD-10-CM | POA: Diagnosis not present

## 2023-07-15 DIAGNOSIS — E1122 Type 2 diabetes mellitus with diabetic chronic kidney disease: Secondary | ICD-10-CM | POA: Diagnosis not present

## 2023-07-15 DIAGNOSIS — I5022 Chronic systolic (congestive) heart failure: Secondary | ICD-10-CM | POA: Diagnosis not present

## 2023-07-15 NOTE — Telephone Encounter (Signed)
   Furth, Cadence H, PA-C  [facility] Triage35 minutes ago (12:36 PM)    Device interrogation was unremarkable. '

## 2023-07-18 DIAGNOSIS — I5022 Chronic systolic (congestive) heart failure: Secondary | ICD-10-CM | POA: Diagnosis not present

## 2023-07-18 DIAGNOSIS — N1831 Chronic kidney disease, stage 3a: Secondary | ICD-10-CM | POA: Diagnosis not present

## 2023-07-18 DIAGNOSIS — I130 Hypertensive heart and chronic kidney disease with heart failure and stage 1 through stage 4 chronic kidney disease, or unspecified chronic kidney disease: Secondary | ICD-10-CM | POA: Diagnosis not present

## 2023-07-18 DIAGNOSIS — E1122 Type 2 diabetes mellitus with diabetic chronic kidney disease: Secondary | ICD-10-CM | POA: Diagnosis not present

## 2023-07-18 DIAGNOSIS — I480 Paroxysmal atrial fibrillation: Secondary | ICD-10-CM | POA: Diagnosis not present

## 2023-07-18 DIAGNOSIS — L03116 Cellulitis of left lower limb: Secondary | ICD-10-CM | POA: Diagnosis not present

## 2023-07-22 DIAGNOSIS — E1122 Type 2 diabetes mellitus with diabetic chronic kidney disease: Secondary | ICD-10-CM | POA: Diagnosis not present

## 2023-07-22 DIAGNOSIS — I5022 Chronic systolic (congestive) heart failure: Secondary | ICD-10-CM | POA: Diagnosis not present

## 2023-07-22 DIAGNOSIS — I130 Hypertensive heart and chronic kidney disease with heart failure and stage 1 through stage 4 chronic kidney disease, or unspecified chronic kidney disease: Secondary | ICD-10-CM | POA: Diagnosis not present

## 2023-07-22 DIAGNOSIS — N1831 Chronic kidney disease, stage 3a: Secondary | ICD-10-CM | POA: Diagnosis not present

## 2023-07-22 DIAGNOSIS — I480 Paroxysmal atrial fibrillation: Secondary | ICD-10-CM | POA: Diagnosis not present

## 2023-07-22 DIAGNOSIS — L03116 Cellulitis of left lower limb: Secondary | ICD-10-CM | POA: Diagnosis not present

## 2023-07-23 DIAGNOSIS — I130 Hypertensive heart and chronic kidney disease with heart failure and stage 1 through stage 4 chronic kidney disease, or unspecified chronic kidney disease: Secondary | ICD-10-CM | POA: Diagnosis not present

## 2023-07-23 DIAGNOSIS — N1831 Chronic kidney disease, stage 3a: Secondary | ICD-10-CM | POA: Diagnosis not present

## 2023-07-23 DIAGNOSIS — I5022 Chronic systolic (congestive) heart failure: Secondary | ICD-10-CM | POA: Diagnosis not present

## 2023-07-23 DIAGNOSIS — E1122 Type 2 diabetes mellitus with diabetic chronic kidney disease: Secondary | ICD-10-CM | POA: Diagnosis not present

## 2023-07-23 DIAGNOSIS — L03116 Cellulitis of left lower limb: Secondary | ICD-10-CM | POA: Diagnosis not present

## 2023-07-23 DIAGNOSIS — I480 Paroxysmal atrial fibrillation: Secondary | ICD-10-CM | POA: Diagnosis not present

## 2023-07-25 ENCOUNTER — Encounter: Payer: Self-pay | Admitting: Internal Medicine

## 2023-07-25 ENCOUNTER — Ambulatory Visit: Payer: Medicare Other

## 2023-07-25 DIAGNOSIS — I442 Atrioventricular block, complete: Secondary | ICD-10-CM | POA: Diagnosis not present

## 2023-07-25 DIAGNOSIS — E1122 Type 2 diabetes mellitus with diabetic chronic kidney disease: Secondary | ICD-10-CM | POA: Diagnosis not present

## 2023-07-25 DIAGNOSIS — I5022 Chronic systolic (congestive) heart failure: Secondary | ICD-10-CM | POA: Diagnosis not present

## 2023-07-25 DIAGNOSIS — L03116 Cellulitis of left lower limb: Secondary | ICD-10-CM | POA: Diagnosis not present

## 2023-07-25 DIAGNOSIS — N1831 Chronic kidney disease, stage 3a: Secondary | ICD-10-CM | POA: Diagnosis not present

## 2023-07-25 DIAGNOSIS — I130 Hypertensive heart and chronic kidney disease with heart failure and stage 1 through stage 4 chronic kidney disease, or unspecified chronic kidney disease: Secondary | ICD-10-CM | POA: Diagnosis not present

## 2023-07-25 DIAGNOSIS — I480 Paroxysmal atrial fibrillation: Secondary | ICD-10-CM | POA: Diagnosis not present

## 2023-07-25 LAB — CUP PACEART REMOTE DEVICE CHECK
Battery Remaining Longevity: 98 mo
Battery Voltage: 3.01 V
Brady Statistic AP VP Percent: 46.25 %
Brady Statistic AP VS Percent: 0.01 %
Brady Statistic AS VP Percent: 52.74 %
Brady Statistic AS VS Percent: 1 %
Brady Statistic RA Percent Paced: 46.95 %
Brady Statistic RV Percent Paced: 98.99 %
Date Time Interrogation Session: 20250312231838
Implantable Lead Connection Status: 753985
Implantable Lead Connection Status: 753985
Implantable Lead Implant Date: 20071018
Implantable Lead Implant Date: 20221212
Implantable Lead Location: 753859
Implantable Lead Location: 753860
Implantable Lead Model: 5076
Implantable Lead Model: 6947
Implantable Pulse Generator Implant Date: 20221212
Lead Channel Impedance Value: 228 Ohm
Lead Channel Impedance Value: 285 Ohm
Lead Channel Impedance Value: 285 Ohm
Lead Channel Impedance Value: 342 Ohm
Lead Channel Pacing Threshold Amplitude: 0.5 V
Lead Channel Pacing Threshold Amplitude: 0.625 V
Lead Channel Pacing Threshold Pulse Width: 0.4 ms
Lead Channel Pacing Threshold Pulse Width: 0.4 ms
Lead Channel Sensing Intrinsic Amplitude: 0.5 mV
Lead Channel Sensing Intrinsic Amplitude: 0.5 mV
Lead Channel Sensing Intrinsic Amplitude: 31.625 mV
Lead Channel Sensing Intrinsic Amplitude: 31.625 mV
Lead Channel Setting Pacing Amplitude: 2 V
Lead Channel Setting Pacing Amplitude: 2.5 V
Lead Channel Setting Pacing Pulse Width: 0.4 ms
Lead Channel Setting Sensing Sensitivity: 5.6 mV
Zone Setting Status: 755011
Zone Setting Status: 755011

## 2023-07-26 DIAGNOSIS — Z87891 Personal history of nicotine dependence: Secondary | ICD-10-CM | POA: Diagnosis not present

## 2023-07-26 DIAGNOSIS — L03116 Cellulitis of left lower limb: Secondary | ICD-10-CM | POA: Diagnosis not present

## 2023-07-26 DIAGNOSIS — Z604 Social exclusion and rejection: Secondary | ICD-10-CM | POA: Diagnosis not present

## 2023-07-26 DIAGNOSIS — I130 Hypertensive heart and chronic kidney disease with heart failure and stage 1 through stage 4 chronic kidney disease, or unspecified chronic kidney disease: Secondary | ICD-10-CM | POA: Diagnosis not present

## 2023-07-26 DIAGNOSIS — I480 Paroxysmal atrial fibrillation: Secondary | ICD-10-CM | POA: Diagnosis not present

## 2023-07-26 DIAGNOSIS — I5022 Chronic systolic (congestive) heart failure: Secondary | ICD-10-CM | POA: Diagnosis not present

## 2023-07-26 DIAGNOSIS — Z993 Dependence on wheelchair: Secondary | ICD-10-CM | POA: Diagnosis not present

## 2023-07-26 DIAGNOSIS — Z5982 Transportation insecurity: Secondary | ICD-10-CM | POA: Diagnosis not present

## 2023-07-26 DIAGNOSIS — N1831 Chronic kidney disease, stage 3a: Secondary | ICD-10-CM | POA: Diagnosis not present

## 2023-07-26 DIAGNOSIS — Z7901 Long term (current) use of anticoagulants: Secondary | ICD-10-CM | POA: Diagnosis not present

## 2023-07-26 DIAGNOSIS — E875 Hyperkalemia: Secondary | ICD-10-CM | POA: Diagnosis not present

## 2023-07-26 DIAGNOSIS — Z9181 History of falling: Secondary | ICD-10-CM | POA: Diagnosis not present

## 2023-07-26 DIAGNOSIS — Z8616 Personal history of COVID-19: Secondary | ICD-10-CM | POA: Diagnosis not present

## 2023-07-26 DIAGNOSIS — Z556 Problems related to health literacy: Secondary | ICD-10-CM | POA: Diagnosis not present

## 2023-07-26 DIAGNOSIS — Z794 Long term (current) use of insulin: Secondary | ICD-10-CM | POA: Diagnosis not present

## 2023-07-26 DIAGNOSIS — I2510 Atherosclerotic heart disease of native coronary artery without angina pectoris: Secondary | ICD-10-CM | POA: Diagnosis not present

## 2023-07-26 DIAGNOSIS — E1122 Type 2 diabetes mellitus with diabetic chronic kidney disease: Secondary | ICD-10-CM | POA: Diagnosis not present

## 2023-07-29 DIAGNOSIS — I130 Hypertensive heart and chronic kidney disease with heart failure and stage 1 through stage 4 chronic kidney disease, or unspecified chronic kidney disease: Secondary | ICD-10-CM | POA: Diagnosis not present

## 2023-07-29 DIAGNOSIS — I5022 Chronic systolic (congestive) heart failure: Secondary | ICD-10-CM | POA: Diagnosis not present

## 2023-07-29 DIAGNOSIS — N1831 Chronic kidney disease, stage 3a: Secondary | ICD-10-CM | POA: Diagnosis not present

## 2023-07-29 DIAGNOSIS — L03116 Cellulitis of left lower limb: Secondary | ICD-10-CM | POA: Diagnosis not present

## 2023-07-29 DIAGNOSIS — I480 Paroxysmal atrial fibrillation: Secondary | ICD-10-CM | POA: Diagnosis not present

## 2023-07-29 DIAGNOSIS — E1122 Type 2 diabetes mellitus with diabetic chronic kidney disease: Secondary | ICD-10-CM | POA: Diagnosis not present

## 2023-08-05 DIAGNOSIS — M79675 Pain in left toe(s): Secondary | ICD-10-CM | POA: Diagnosis not present

## 2023-08-05 DIAGNOSIS — B351 Tinea unguium: Secondary | ICD-10-CM | POA: Diagnosis not present

## 2023-08-05 DIAGNOSIS — M79674 Pain in right toe(s): Secondary | ICD-10-CM | POA: Diagnosis not present

## 2023-08-06 ENCOUNTER — Ambulatory Visit (HOSPITAL_COMMUNITY)
Admission: RE | Admit: 2023-08-06 | Discharge: 2023-08-06 | Disposition: A | Discharge status: Home / Self Care | Source: Ambulatory Visit | Attending: Medical | Admitting: Medical

## 2023-08-06 DIAGNOSIS — R7989 Other specified abnormal findings of blood chemistry: Secondary | ICD-10-CM | POA: Insufficient documentation

## 2023-08-06 DIAGNOSIS — I442 Atrioventricular block, complete: Secondary | ICD-10-CM | POA: Diagnosis not present

## 2023-08-06 LAB — ECHOCARDIOGRAM COMPLETE
AR max vel: 2.02 cm2
AV Area VTI: 2.39 cm2
AV Area mean vel: 2.2 cm2
AV Mean grad: 5 mmHg
AV Peak grad: 9.1 mmHg
Ao pk vel: 1.51 m/s
Area-P 1/2: 1.14 cm2
Est EF: 35
MV M vel: 4.07 m/s
MV Peak grad: 66.3 mmHg
S' Lateral: 4.7 cm

## 2023-08-06 MED ORDER — PERFLUTREN LIPID MICROSPHERE
1.0000 mL | INTRAVENOUS | Status: AC | PRN
  Administered 2023-08-06: 3 mL via INTRAVENOUS

## 2023-08-06 NOTE — Progress Notes (Signed)
*  PRELIMINARY RESULTS* Echocardiogram 2D Echocardiogram has been performed with Definity.  Stacey Drain 08/06/2023, 4:03 PM

## 2023-08-07 ENCOUNTER — Ambulatory Visit: Payer: Medicare Other | Attending: Cardiology | Admitting: Cardiology

## 2023-08-07 ENCOUNTER — Encounter: Payer: Self-pay | Admitting: Cardiology

## 2023-08-07 VITALS — BP 134/62 | HR 63 | Ht 66.0 in | Wt 159.6 lb

## 2023-08-07 DIAGNOSIS — I25119 Atherosclerotic heart disease of native coronary artery with unspecified angina pectoris: Secondary | ICD-10-CM | POA: Insufficient documentation

## 2023-08-07 DIAGNOSIS — I5020 Unspecified systolic (congestive) heart failure: Secondary | ICD-10-CM | POA: Diagnosis not present

## 2023-08-07 DIAGNOSIS — N1832 Chronic kidney disease, stage 3b: Secondary | ICD-10-CM | POA: Insufficient documentation

## 2023-08-07 DIAGNOSIS — I480 Paroxysmal atrial fibrillation: Secondary | ICD-10-CM | POA: Diagnosis not present

## 2023-08-07 NOTE — Progress Notes (Signed)
 Cardiology Office Note  Date: 08/07/2023   ID: DEMETRIO LEIGHTY, DOB 1927/03/25, MRN [id number]  History of Present Illness: [name] is a 89 y.o. male last seen in February by Ms. Lorna Few, I reviewed the note.  He is here today with his son, continues to reside at [place].  I reviewed the interval chart, he states that things have settled down since last visit, no falls, no dizziness or syncope.  He does not report any chest pain, tends to use a wheelchair most of the time to get around.  Medtronic dual-chamber pacemaker in place with follow-up by Dr. Ladona Ridgel.  Recent device check indicated normal function with very low AT/AF burden.  We went over his medications today.  He reports compliance with treatment, no spontaneous bleeding problems on Eliquis.  Recent follow-up echocardiogram shows LVEF approximately 35%.  Plan to continue medical therapy.  Physical Exam: VS:  BP 134/62   Pulse 63   Ht 5\' 6"  (1.676 m)   Wt 159 lb 9.6 oz (72.4 kg)   SpO2 98%   BMI 25.76 kg/m , BMI Body mass index is 25.76 kg/m.  Wt Readings from Last 3 Encounters:  08/07/23 159 lb 9.6 oz (72.4 kg)  07/12/23 158 lb 6.4 oz (71.8 kg)  06/22/23 154 lb 8.7 oz (70.1 kg)    General: Patient appears comfortable at rest.  In wheelchair today. HEENT: Conjunctiva and lids normal. Neck: Supple, no elevated JVP or carotid bruits. Lungs: Clear to auscultation, nonlabored breathing at rest. Cardiac: Regular rate and rhythm, no S3, 1/6 systolic murmur. Extremities: No pitting edema.  ECG:  An ECG dated 06/22/2023 was personally reviewed today and demonstrated:  Dual-chamber pacing.  Labwork: October 2024: Cholesterol 175, triglycerides 167, HDL 31, LDL 114 06/22/2023: ALT 11; AST 18; BUN 43; Creatinine, Ser 1.90; Hemoglobin 11.7; Magnesium 2.1; Platelets 120; Potassium 5.3; Sodium 138   Other Studies Reviewed Today:  Echocardiogram 08/06/2023:  1. No evidence of LV thrombus by Definity. Left ventricular  ejection  fraction, by estimation, is 35%. The left ventricle has moderately  decreased function. The left ventricle demonstrates regional wall motion  abnormalities (see scoring diagram/findings  for description). There is severe asymmetric left ventricular hypertrophy  of the basal and septal segments. Left ventricular diastolic parameters  are consistent with Grade I diastolic dysfunction (impaired relaxation).   2. Right ventricular systolic function is normal. The right ventricular  size is normal. Tricuspid regurgitation signal is inadequate for assessing  PA pressure.   3. Left atrial size was severely dilated.   4. The mitral valve is normal in structure. Trivial mitral valve  regurgitation. No evidence of mitral stenosis.   5. The aortic valve has an indeterminant number of cusps. Aortic valve  regurgitation is trivial. No aortic stenosis is present.   6. The inferior vena cava is normal in size with greater than 50%  respiratory variability, suggesting right atrial pressure of 3 mmHg.   Assessment and Plan:  1.  Multivessel CAD status post CABG in 2006 with LIMA to LAD, SVG to circumflex, and SVG to RCA.  No angina at this time with low-level activity.  Not on aspirin given use of Eliquis.  Continue Zocor 40 mg daily and as needed nitroglycerin.   2.  HFrEF, LVEF approximately 35% by recent follow-up echocardiogram.  Underlying ischemic cardiomyopathy with plan to continue medical therapy.  No evidence of fluid overload at this time.  Continue Coreg 12.5 mg twice daily, Lasix 20  mg daily with potassium supplement, and Entresto 24/26 mg twice daily.   3.  Complete heart block status post Medtronic pacemaker with follow-up with Dr. Ladona Ridgel.   4.  Paroxysmal atrial fibrillation/flutter with CHA2DS2-VASc score of 5.  No palpitations and low rhythm burden based on device interrogations.  He is on Eliquis 2.5 mg twice daily for stroke prophylaxis.   5.  CKD stage IIIb, creatinine 1.82  with GFR 34 in February.  Disposition:  Follow up  6 months.  Signed, Jonelle Sidle, M.D., F.A.C.C. [facility]

## 2023-08-07 NOTE — Patient Instructions (Signed)
 Medication Instructions:  Your physician recommends that you continue on your current medications as directed. Please refer to the Current Medication list given to you today.   Labwork: None today  Testing/Procedures: None today  Follow-Up: 6 months  Any Other Special Instructions Will Be Listed Below (If Applicable).  If you need a refill on your cardiac medications before your next appointment, please call your pharmacy.

## 2023-08-08 DIAGNOSIS — I5022 Chronic systolic (congestive) heart failure: Secondary | ICD-10-CM | POA: Diagnosis not present

## 2023-08-08 DIAGNOSIS — N1831 Chronic kidney disease, stage 3a: Secondary | ICD-10-CM | POA: Diagnosis not present

## 2023-08-08 DIAGNOSIS — E1122 Type 2 diabetes mellitus with diabetic chronic kidney disease: Secondary | ICD-10-CM | POA: Diagnosis not present

## 2023-08-08 DIAGNOSIS — I480 Paroxysmal atrial fibrillation: Secondary | ICD-10-CM | POA: Diagnosis not present

## 2023-08-08 DIAGNOSIS — I130 Hypertensive heart and chronic kidney disease with heart failure and stage 1 through stage 4 chronic kidney disease, or unspecified chronic kidney disease: Secondary | ICD-10-CM | POA: Diagnosis not present

## 2023-08-08 DIAGNOSIS — L03116 Cellulitis of left lower limb: Secondary | ICD-10-CM | POA: Diagnosis not present

## 2023-08-13 ENCOUNTER — Encounter: Payer: Self-pay | Admitting: Family Medicine

## 2023-08-13 DIAGNOSIS — E538 Deficiency of other specified B group vitamins: Secondary | ICD-10-CM | POA: Diagnosis not present

## 2023-08-13 DIAGNOSIS — I890 Lymphedema, not elsewhere classified: Secondary | ICD-10-CM | POA: Diagnosis not present

## 2023-08-13 DIAGNOSIS — M1A0720 Idiopathic chronic gout, left ankle and foot, without tophus (tophi): Secondary | ICD-10-CM | POA: Diagnosis not present

## 2023-08-13 DIAGNOSIS — E1169 Type 2 diabetes mellitus with other specified complication: Secondary | ICD-10-CM | POA: Diagnosis not present

## 2023-08-13 DIAGNOSIS — R600 Localized edema: Secondary | ICD-10-CM | POA: Diagnosis not present

## 2023-08-13 DIAGNOSIS — J9601 Acute respiratory failure with hypoxia: Secondary | ICD-10-CM | POA: Diagnosis not present

## 2023-08-13 DIAGNOSIS — N1831 Chronic kidney disease, stage 3a: Secondary | ICD-10-CM | POA: Diagnosis not present

## 2023-08-13 DIAGNOSIS — Z951 Presence of aortocoronary bypass graft: Secondary | ICD-10-CM | POA: Diagnosis not present

## 2023-08-13 DIAGNOSIS — D61818 Other pancytopenia: Secondary | ICD-10-CM | POA: Diagnosis not present

## 2023-08-13 DIAGNOSIS — I5022 Chronic systolic (congestive) heart failure: Secondary | ICD-10-CM | POA: Diagnosis not present

## 2023-08-13 DIAGNOSIS — E782 Mixed hyperlipidemia: Secondary | ICD-10-CM | POA: Diagnosis not present

## 2023-08-13 DIAGNOSIS — I480 Paroxysmal atrial fibrillation: Secondary | ICD-10-CM | POA: Diagnosis not present

## 2023-08-13 DIAGNOSIS — I2510 Atherosclerotic heart disease of native coronary artery without angina pectoris: Secondary | ICD-10-CM | POA: Diagnosis not present

## 2023-08-29 DIAGNOSIS — N1831 Chronic kidney disease, stage 3a: Secondary | ICD-10-CM | POA: Diagnosis not present

## 2023-08-29 DIAGNOSIS — E1169 Type 2 diabetes mellitus with other specified complication: Secondary | ICD-10-CM | POA: Diagnosis not present

## 2023-08-29 DIAGNOSIS — J9601 Acute respiratory failure with hypoxia: Secondary | ICD-10-CM | POA: Diagnosis not present

## 2023-08-29 DIAGNOSIS — I2510 Atherosclerotic heart disease of native coronary artery without angina pectoris: Secondary | ICD-10-CM | POA: Diagnosis not present

## 2023-08-29 DIAGNOSIS — I5022 Chronic systolic (congestive) heart failure: Secondary | ICD-10-CM | POA: Diagnosis not present

## 2023-08-29 DIAGNOSIS — I890 Lymphedema, not elsewhere classified: Secondary | ICD-10-CM | POA: Diagnosis not present

## 2023-08-29 DIAGNOSIS — Z951 Presence of aortocoronary bypass graft: Secondary | ICD-10-CM | POA: Diagnosis not present

## 2023-08-29 DIAGNOSIS — E782 Mixed hyperlipidemia: Secondary | ICD-10-CM | POA: Diagnosis not present

## 2023-08-29 DIAGNOSIS — I480 Paroxysmal atrial fibrillation: Secondary | ICD-10-CM | POA: Diagnosis not present

## 2023-08-29 DIAGNOSIS — R600 Localized edema: Secondary | ICD-10-CM | POA: Diagnosis not present

## 2023-08-29 DIAGNOSIS — M1A0720 Idiopathic chronic gout, left ankle and foot, without tophus (tophi): Secondary | ICD-10-CM | POA: Diagnosis not present

## 2023-08-29 DIAGNOSIS — M10072 Idiopathic gout, left ankle and foot: Secondary | ICD-10-CM | POA: Diagnosis not present

## 2023-09-04 DIAGNOSIS — E538 Deficiency of other specified B group vitamins: Secondary | ICD-10-CM | POA: Diagnosis not present

## 2023-09-05 NOTE — Progress Notes (Signed)
 Remote pacemaker transmission.

## 2023-09-06 ENCOUNTER — Other Ambulatory Visit: Payer: Self-pay

## 2023-09-06 ENCOUNTER — Encounter (HOSPITAL_COMMUNITY): Payer: Self-pay

## 2023-09-06 ENCOUNTER — Emergency Department (HOSPITAL_COMMUNITY)
Admission: EM | Admit: 2023-09-06 | Discharge: 2023-09-07 | Disposition: A | Discharge status: Home / Self Care | Attending: Emergency Medicine | Admitting: Emergency Medicine

## 2023-09-06 ENCOUNTER — Emergency Department (HOSPITAL_COMMUNITY)

## 2023-09-06 DIAGNOSIS — Z95810 Presence of automatic (implantable) cardiac defibrillator: Secondary | ICD-10-CM | POA: Diagnosis not present

## 2023-09-06 DIAGNOSIS — Z950 Presence of cardiac pacemaker: Secondary | ICD-10-CM | POA: Diagnosis not present

## 2023-09-06 DIAGNOSIS — R197 Diarrhea, unspecified: Secondary | ICD-10-CM | POA: Diagnosis not present

## 2023-09-06 DIAGNOSIS — Z8546 Personal history of malignant neoplasm of prostate: Secondary | ICD-10-CM | POA: Diagnosis not present

## 2023-09-06 DIAGNOSIS — Z79899 Other long term (current) drug therapy: Secondary | ICD-10-CM | POA: Insufficient documentation

## 2023-09-06 DIAGNOSIS — I480 Paroxysmal atrial fibrillation: Secondary | ICD-10-CM | POA: Diagnosis not present

## 2023-09-06 DIAGNOSIS — Z7901 Long term (current) use of anticoagulants: Secondary | ICD-10-CM | POA: Diagnosis not present

## 2023-09-06 DIAGNOSIS — R000 Tachycardia, unspecified: Secondary | ICD-10-CM | POA: Diagnosis not present

## 2023-09-06 DIAGNOSIS — I509 Heart failure, unspecified: Secondary | ICD-10-CM | POA: Insufficient documentation

## 2023-09-06 DIAGNOSIS — R0789 Other chest pain: Secondary | ICD-10-CM | POA: Diagnosis not present

## 2023-09-06 DIAGNOSIS — R0989 Other specified symptoms and signs involving the circulatory and respiratory systems: Secondary | ICD-10-CM | POA: Diagnosis not present

## 2023-09-06 DIAGNOSIS — I110 Hypertensive heart disease with heart failure: Secondary | ICD-10-CM | POA: Diagnosis not present

## 2023-09-06 DIAGNOSIS — R0781 Pleurodynia: Secondary | ICD-10-CM | POA: Diagnosis not present

## 2023-09-06 DIAGNOSIS — R079 Chest pain, unspecified: Secondary | ICD-10-CM | POA: Diagnosis not present

## 2023-09-06 LAB — TROPONIN I (HIGH SENSITIVITY)
Troponin I (High Sensitivity): 51 ng/L — ABNORMAL HIGH (ref <18)
Troponin I (High Sensitivity): 55 ng/L — ABNORMAL HIGH (ref <18)

## 2023-09-06 LAB — CBC
HCT: 35.4 % — ABNORMAL LOW (ref 39.0–52.0)
Hemoglobin: 11.5 g/dL — ABNORMAL LOW (ref 13.0–17.0)
MCH: 31 pg (ref 26.0–34.0)
MCHC: 32.5 g/dL (ref 30.0–36.0)
MCV: 95.4 fL (ref 80.0–100.0)
Platelets: 165 10*3/uL (ref 150–400)
RBC: 3.71 MIL/uL — ABNORMAL LOW (ref 4.22–5.81)
RDW: 13.9 % (ref 11.5–15.5)
WBC: 4.6 10*3/uL (ref 4.0–10.5)
nRBC: 0 % (ref 0.0–0.2)

## 2023-09-06 LAB — BASIC METABOLIC PANEL WITH GFR
Anion gap: 8 (ref 5–15)
BUN: 36 mg/dL — ABNORMAL HIGH (ref 8–23)
CO2: 25 mmol/L (ref 22–32)
Calcium: 8.2 mg/dL — ABNORMAL LOW (ref 8.9–10.3)
Chloride: 105 mmol/L (ref 98–111)
Creatinine, Ser: 1.5 mg/dL — ABNORMAL HIGH (ref 0.61–1.24)
GFR, Estimated: 43 mL/min — ABNORMAL LOW (ref >60)
Glucose, Bld: 91 mg/dL (ref 70–99)
Potassium: 3.5 mmol/L (ref 3.5–5.1)
Sodium: 138 mmol/L (ref 135–145)

## 2023-09-06 NOTE — ED Triage Notes (Addendum)
 [facility] called out to [facility] [address] for left rib pain. No hx of falls reported from facility or pt. Tylenol  given before EMS arrived. Diarrhea for three days.

## 2023-09-07 NOTE — ED Provider Notes (Signed)
 [place] EMERGENCY DEPARTMENT AT [facility] Provider Note   CSN: [id number] Arrival date & time: 09/06/23  1927     History  Chief Complaint  Patient presents with   Chest Pain    [name] is a 89 y.o. male.  Pt is a 89 yo male with pmhx significant for chf, htn, hld, prostate cancer, chf, complete heart block s/p pacemaker placement, paroxysmal afib (on Eliquis ).  Pt told his SNF that he had some left sided cp.  He denies trauma.  He said he feels fine after getting tylenol .  No sob.  No n/v.       Home Medications Prior to Admission medications   Medication Sig Start Date End Date Taking? Authorizing Provider  acetaminophen  (TYLENOL ) 500 MG tablet Take 1,000 mg by mouth every 8 (eight) hours as needed for mild pain (pain score 1-3).   Yes [provider]  apixaban  (ELIQUIS ) 2.5 MG TABS tablet Take 1 tablet (2.5 mg total) by mouth 2 (two) times daily. 09/04/22  Yes Tammie Fall, MD  carvedilol  (COREG ) 12.5 MG tablet TAKE 1 TABLET(12.5 MG) BY MOUTH TWICE DAILY WITH A MEAL 04/09/22  Yes Gerard Knight, MD  colchicine 0.6 MG tablet Take 0.6 mg by mouth 2 (two) times daily.   Yes [provider]  Cyanocobalamin  (VITAMIN DEFICIENCY SYSTEM-B12) 1000 MCG/ML KIT Inject 1 mL into the skin every 30 (thirty) days. On the 22nd of each month   Yes [provider]  docusate sodium  (COLACE) 100 MG capsule Take 100 mg by mouth 2 (two) times daily.   Yes [provider]  febuxostat (ULORIC) 40 MG tablet Take 40 mg by mouth daily. 08/30/23  Yes [provider]  ferrous sulfate 325 (65 FE) MG EC tablet Take 325 mg by mouth 3 (three) times a week. Monday, Wednesday, and Friday   Yes [provider]  furosemide  (LASIX ) 40 MG tablet TAKE 1 TABLET(40 MG) BY MOUTH DAILY Patient taking differently: Take 40 mg by mouth every other day. 07/28/21  Yes Gerard Knight, MD  LANTUS  SOLOSTAR 100 UNIT/ML Solostar Pen Inject 20 Units  into the skin daily. 06/19/23  Yes [provider]  nitroGLYCERIN  (NITROSTAT ) 0.4 MG SL tablet Place 1 tablet (0.4 mg total) under the tongue every 5 (five) minutes as needed. 06/08/19  Yes Flo Hummingbird, PA-C  potassium chloride  (KLOR-CON  M) 10 MEQ tablet Take 10 mEq by mouth every other day.   Yes [provider]  sacubitril -valsartan  (ENTRESTO ) 24-26 MG Take 1 tablet by mouth 2 (two) times daily. 07/04/22  Yes Gerard Knight, MD  simvastatin  (ZOCOR ) 40 MG tablet TAKE 1 TABLET BY MOUTH EVERY DAY IN THE EVENING 05/22/22  Yes Gerard Knight, MD      Allergies    Codeine    Review of Systems   Review of Systems  Cardiovascular:  Positive for chest pain.  All other systems reviewed and are negative.   Physical Exam Updated Vital Signs BP (!) 146/57 (BP Location: Right Arm)   Pulse 65   Temp 97.8 F (36.6 C) (Oral)   Resp 18   SpO2 98%  Physical Exam Vitals and nursing note reviewed.  Constitutional:      Appearance: He is well-developed.  HENT:     Head: Normocephalic and atraumatic.  Eyes:     Extraocular Movements: Extraocular movements intact.     Pupils: Pupils are equal, round, and reactive to light.  Cardiovascular:  Rate and Rhythm: Normal rate and regular rhythm.  Abdominal:     General: Bowel sounds are normal.     Palpations: Abdomen is soft.  Musculoskeletal:        General: Normal range of motion.     Cervical back: Normal range of motion and neck supple.  Skin:    General: Skin is warm and dry.     Capillary Refill: Capillary refill takes less than 2 seconds.  Neurological:     General: No focal deficit present.     Mental Status: He is alert and oriented to person, place, and time.  Psychiatric:        Mood and Affect: Mood normal.        Behavior: Behavior normal.     ED Results / Procedures / Treatments   Labs (all labs ordered are listed, but only abnormal results are displayed) Labs Reviewed  BASIC METABOLIC PANEL WITH  GFR - Abnormal; Notable for the following components:      Result Value   BUN 36 (*)    Creatinine, Ser 1.50 (*)    Calcium  8.2 (*)    GFR, Estimated 43 (*)    All other components within normal limits  CBC - Abnormal; Notable for the following components:   RBC 3.71 (*)    Hemoglobin 11.5 (*)    HCT 35.4 (*)    All other components within normal limits  TROPONIN I (HIGH SENSITIVITY) - Abnormal; Notable for the following components:   Troponin I (High Sensitivity) 51 (*)    All other components within normal limits  TROPONIN I (HIGH SENSITIVITY) - Abnormal; Notable for the following components:   Troponin I (High Sensitivity) 55 (*)    All other components within normal limits    EKG EKG Interpretation Date/Time:  Friday September 06 2023 19:43:35 EDT Ventricular Rate:  73 PR Interval:  213 QRS Duration:  142 QT Interval:  456 QTC Calculation: 583 R Axis:   -25  Text Interpretation: A-V dual-paced complexes w/ some inhibition No further analysis attempted due to paced rhythm Confirmed by Eldon Greenland ([id number]) on 09/06/2023 11:37:56 PM  Radiology DG Chest Port 1 View Result Date: 09/06/2023 CLINICAL DATA:  Chest pain, left rib pain EXAM: PORTABLE CHEST 1 VIEW COMPARISON:  06/22/2023 FINDINGS: Single frontal view of the chest demonstrates stable pacer/AICD. Cardiac silhouette remains enlarged. Stable pulmonary vascular congestion. No airspace disease, effusion, or pneumothorax. Prior healed right rib fractures. No acute bony abnormalities. IMPRESSION: 1. Stable chest, no acute process. Electronically Signed   By: Bobbye Burrow M.D.   On: 09/06/2023 20:11    Procedures Procedures    Medications Ordered in ED Medications - No data to display  ED Course/ Medical Decision Making/ A&P                                 Medical Decision Making Amount and/or Complexity of Data Reviewed Labs: ordered. Radiology: ordered.   This patient presents to the ED for concern of chest  pain, this involves an extensive number of treatment options, and is a complaint that carries with it a high risk of complications and morbidity.  The differential diagnosis includes cardiac, pulm, gi, msk   Co morbidities that complicate the patient evaluation  chf, htn, hld, prostate cancer, chf, complete heart block s/p pacemaker placement, paroxysmal afib (on Eliquis )   Additional history obtained:  Additional history obtained from epic  chart review External records from outside source obtained and reviewed including son/EMS report   Lab Tests:  I Ordered, and personally interpreted labs.  The pertinent results include:  cbc with hgb 11.5 (stable); bmp with cr 1.5 (improved from 1.9 on 2/8), trop sl elevated, but flat (51 and 55), but improved from feb when it was 79.    Imaging Studies ordered:  I ordered imaging studies including cxr  I independently visualized and interpreted imaging which showed Stable chest, no acute process.  I agree with the radiologist interpretation   Cardiac Monitoring:  The patient was maintained on a cardiac monitor.  I personally viewed and interpreted the cardiac monitored which showed an underlying rhythm of: paced rhythm   Medicines ordered and prescription drug management:   I have reviewed the patients home medicines and have made adjustments as needed   Test Considered:  ct   Problem List / ED Course:  Cp:  atypical.  Relieved with tylenol .  Cardiac eval neg here.  He is not sob and is on eliquis , so I doubt PE.  Pt is stable for d/c.  Pt and son good with this plan.  Return if worse.  F/u with pcp.   Reevaluation:  After the interventions noted above, I reevaluated the patient and found that they have :improved   Social Determinants of Health:  Lives at home   Dispostion:  After consideration of the diagnostic results and the patients response to treatment, I feel that the patent would benefit from discharge with  outpatient f/u.          Final Clinical Impression(s) / ED Diagnoses Final diagnoses:  Atypical chest pain    Rx / DC Orders ED Discharge Orders     None         Sueellen Emery, MD 09/07/23 1523

## 2023-09-11 DIAGNOSIS — J069 Acute upper respiratory infection, unspecified: Secondary | ICD-10-CM | POA: Diagnosis not present

## 2023-09-11 DIAGNOSIS — J302 Other seasonal allergic rhinitis: Secondary | ICD-10-CM | POA: Diagnosis not present

## 2023-09-11 DIAGNOSIS — R600 Localized edema: Secondary | ICD-10-CM | POA: Diagnosis not present

## 2023-09-11 DIAGNOSIS — R0789 Other chest pain: Secondary | ICD-10-CM | POA: Diagnosis not present

## 2023-09-11 DIAGNOSIS — D61818 Other pancytopenia: Secondary | ICD-10-CM | POA: Diagnosis not present

## 2023-09-11 DIAGNOSIS — N1831 Chronic kidney disease, stage 3a: Secondary | ICD-10-CM | POA: Diagnosis not present

## 2023-09-11 DIAGNOSIS — M1A0720 Idiopathic chronic gout, left ankle and foot, without tophus (tophi): Secondary | ICD-10-CM | POA: Diagnosis not present

## 2023-09-25 ENCOUNTER — Telehealth: Payer: Self-pay | Admitting: Radiation Oncology

## 2023-09-25 NOTE — Telephone Encounter (Signed)
 5/14 Received call from Delrae Field (daughter-in-law) for a copy of medical record to be email to pt's son Roanan Bodley [facility]).  Patient's son is aware -per Mariah Shines.  Secure email sent as requested.

## 2023-10-09 ENCOUNTER — Emergency Department (HOSPITAL_COMMUNITY)

## 2023-10-09 ENCOUNTER — Other Ambulatory Visit: Payer: Self-pay

## 2023-10-09 ENCOUNTER — Encounter (HOSPITAL_COMMUNITY): Payer: Self-pay | Admitting: *Deleted

## 2023-10-09 ENCOUNTER — Observation Stay (HOSPITAL_COMMUNITY)
Admission: EM | Admit: 2023-10-09 | Discharge: 2023-10-10 | Disposition: A | Discharge status: Home / Self Care | Attending: Internal Medicine | Admitting: Internal Medicine

## 2023-10-09 DIAGNOSIS — R0602 Shortness of breath: Secondary | ICD-10-CM | POA: Diagnosis not present

## 2023-10-09 DIAGNOSIS — R069 Unspecified abnormalities of breathing: Secondary | ICD-10-CM | POA: Diagnosis not present

## 2023-10-09 DIAGNOSIS — Z794 Long term (current) use of insulin: Secondary | ICD-10-CM | POA: Insufficient documentation

## 2023-10-09 DIAGNOSIS — Z7901 Long term (current) use of anticoagulants: Secondary | ICD-10-CM | POA: Diagnosis not present

## 2023-10-09 DIAGNOSIS — C7492 Malignant neoplasm of unspecified part of left adrenal gland: Secondary | ICD-10-CM | POA: Insufficient documentation

## 2023-10-09 DIAGNOSIS — J209 Acute bronchitis, unspecified: Secondary | ICD-10-CM | POA: Diagnosis not present

## 2023-10-09 DIAGNOSIS — Z87891 Personal history of nicotine dependence: Secondary | ICD-10-CM | POA: Diagnosis not present

## 2023-10-09 DIAGNOSIS — E1165 Type 2 diabetes mellitus with hyperglycemia: Secondary | ICD-10-CM | POA: Insufficient documentation

## 2023-10-09 DIAGNOSIS — I130 Hypertensive heart and chronic kidney disease with heart failure and stage 1 through stage 4 chronic kidney disease, or unspecified chronic kidney disease: Secondary | ICD-10-CM | POA: Diagnosis not present

## 2023-10-09 DIAGNOSIS — I480 Paroxysmal atrial fibrillation: Secondary | ICD-10-CM | POA: Diagnosis not present

## 2023-10-09 DIAGNOSIS — R2681 Unsteadiness on feet: Secondary | ICD-10-CM | POA: Insufficient documentation

## 2023-10-09 DIAGNOSIS — I10 Essential (primary) hypertension: Secondary | ICD-10-CM | POA: Diagnosis present

## 2023-10-09 DIAGNOSIS — Z66 Do not resuscitate: Secondary | ICD-10-CM | POA: Diagnosis not present

## 2023-10-09 DIAGNOSIS — I2510 Atherosclerotic heart disease of native coronary artery without angina pectoris: Secondary | ICD-10-CM | POA: Diagnosis not present

## 2023-10-09 DIAGNOSIS — I5022 Chronic systolic (congestive) heart failure: Secondary | ICD-10-CM | POA: Insufficient documentation

## 2023-10-09 DIAGNOSIS — E1159 Type 2 diabetes mellitus with other circulatory complications: Secondary | ICD-10-CM | POA: Diagnosis not present

## 2023-10-09 DIAGNOSIS — N179 Acute kidney failure, unspecified: Secondary | ICD-10-CM | POA: Diagnosis not present

## 2023-10-09 DIAGNOSIS — R739 Hyperglycemia, unspecified: Secondary | ICD-10-CM | POA: Diagnosis present

## 2023-10-09 DIAGNOSIS — J9601 Acute respiratory failure with hypoxia: Secondary | ICD-10-CM | POA: Diagnosis not present

## 2023-10-09 DIAGNOSIS — Z1152 Encounter for screening for COVID-19: Secondary | ICD-10-CM | POA: Insufficient documentation

## 2023-10-09 DIAGNOSIS — E1122 Type 2 diabetes mellitus with diabetic chronic kidney disease: Secondary | ICD-10-CM | POA: Insufficient documentation

## 2023-10-09 DIAGNOSIS — N1832 Chronic kidney disease, stage 3b: Secondary | ICD-10-CM | POA: Insufficient documentation

## 2023-10-09 DIAGNOSIS — E7849 Other hyperlipidemia: Secondary | ICD-10-CM | POA: Insufficient documentation

## 2023-10-09 DIAGNOSIS — Z8546 Personal history of malignant neoplasm of prostate: Secondary | ICD-10-CM | POA: Insufficient documentation

## 2023-10-09 DIAGNOSIS — J168 Pneumonia due to other specified infectious organisms: Secondary | ICD-10-CM | POA: Diagnosis not present

## 2023-10-09 DIAGNOSIS — Z95810 Presence of automatic (implantable) cardiac defibrillator: Secondary | ICD-10-CM | POA: Diagnosis not present

## 2023-10-09 DIAGNOSIS — R0689 Other abnormalities of breathing: Secondary | ICD-10-CM | POA: Diagnosis not present

## 2023-10-09 DIAGNOSIS — Z79899 Other long term (current) drug therapy: Secondary | ICD-10-CM | POA: Insufficient documentation

## 2023-10-09 DIAGNOSIS — J189 Pneumonia, unspecified organism: Principal | ICD-10-CM

## 2023-10-09 DIAGNOSIS — Z950 Presence of cardiac pacemaker: Secondary | ICD-10-CM | POA: Insufficient documentation

## 2023-10-09 DIAGNOSIS — R0902 Hypoxemia: Secondary | ICD-10-CM | POA: Diagnosis not present

## 2023-10-09 DIAGNOSIS — Z951 Presence of aortocoronary bypass graft: Secondary | ICD-10-CM | POA: Insufficient documentation

## 2023-10-09 DIAGNOSIS — R059 Cough, unspecified: Secondary | ICD-10-CM | POA: Diagnosis not present

## 2023-10-09 LAB — CBC WITH DIFFERENTIAL/PLATELET
Abs Immature Granulocytes: 0.06 10*3/uL (ref 0.00–0.07)
Basophils Absolute: 0 10*3/uL (ref 0.0–0.1)
Basophils Relative: 0 %
Eosinophils Absolute: 0 10*3/uL (ref 0.0–0.5)
Eosinophils Relative: 0 %
HCT: 38.8 % — ABNORMAL LOW (ref 39.0–52.0)
Hemoglobin: 12 g/dL — ABNORMAL LOW (ref 13.0–17.0)
Immature Granulocytes: 1 %
Lymphocytes Relative: 23 %
Lymphs Abs: 1.2 10*3/uL (ref 0.7–4.0)
MCH: 30.5 pg (ref 26.0–34.0)
MCHC: 30.9 g/dL (ref 30.0–36.0)
MCV: 98.7 fL (ref 80.0–100.0)
Monocytes Absolute: 2.4 10*3/uL — ABNORMAL HIGH (ref 0.1–1.0)
Monocytes Relative: 47 %
Neutro Abs: 1.5 10*3/uL — ABNORMAL LOW (ref 1.7–7.7)
Neutrophils Relative %: 29 %
Platelets: 123 10*3/uL — ABNORMAL LOW (ref 150–400)
RBC: 3.93 MIL/uL — ABNORMAL LOW (ref 4.22–5.81)
RDW: 14.8 % (ref 11.5–15.5)
WBC: 5.1 10*3/uL (ref 4.0–10.5)
nRBC: 0 % (ref 0.0–0.2)

## 2023-10-09 LAB — COMPREHENSIVE METABOLIC PANEL WITH GFR
ALT: 14 U/L (ref 0–44)
AST: 22 U/L (ref 15–41)
Albumin: 3.2 g/dL — ABNORMAL LOW (ref 3.5–5.0)
Alkaline Phosphatase: 63 U/L (ref 38–126)
Anion gap: 13 (ref 5–15)
BUN: 43 mg/dL — ABNORMAL HIGH (ref 8–23)
CO2: 24 mmol/L (ref 22–32)
Calcium: 8.8 mg/dL — ABNORMAL LOW (ref 8.9–10.3)
Chloride: 103 mmol/L (ref 98–111)
Creatinine, Ser: 1.59 mg/dL — ABNORMAL HIGH (ref 0.61–1.24)
GFR, Estimated: 40 mL/min — ABNORMAL LOW (ref >60)
Glucose, Bld: 56 mg/dL — ABNORMAL LOW (ref 70–99)
Potassium: 4.7 mmol/L (ref 3.5–5.1)
Sodium: 140 mmol/L (ref 135–145)
Total Bilirubin: 0.7 mg/dL (ref 0.0–1.2)
Total Protein: 6.7 g/dL (ref 6.5–8.1)

## 2023-10-09 LAB — BLOOD GAS, VENOUS
Acid-base deficit: 1 mmol/L (ref 0.0–2.0)
Bicarbonate: 27.1 mmol/L (ref 20.0–28.0)
Drawn by: 442
O2 Saturation: 19.9 %
Patient temperature: 38
pCO2, Ven: 62 mmHg — ABNORMAL HIGH (ref 44–60)
pH, Ven: 7.26 (ref 7.25–7.43)
pO2, Ven: <31 mmHg — CL (ref 32–45)

## 2023-10-09 LAB — BRAIN NATRIURETIC PEPTIDE: B Natriuretic Peptide: 2245 pg/mL — ABNORMAL HIGH (ref 0.0–100.0)

## 2023-10-09 LAB — LACTIC ACID, PLASMA
Lactic Acid, Venous: 2.2 mmol/L (ref 0.5–1.9)
Lactic Acid, Venous: 1 mmol/L (ref 0.5–1.9)

## 2023-10-09 LAB — PROCALCITONIN: Procalcitonin: 2.54 ng/mL

## 2023-10-09 LAB — TROPONIN I (HIGH SENSITIVITY)
Troponin I (High Sensitivity): 55 ng/L — ABNORMAL HIGH (ref <18)
Troponin I (High Sensitivity): 52 ng/L — ABNORMAL HIGH (ref <18)

## 2023-10-09 LAB — RESP PANEL BY RT-PCR (RSV, FLU A&B, COVID)  RVPGX2
Influenza A by PCR: NEGATIVE
Influenza B by PCR: NEGATIVE
Resp Syncytial Virus by PCR: NEGATIVE
SARS Coronavirus 2 by RT PCR: NEGATIVE

## 2023-10-09 LAB — GLUCOSE, CAPILLARY: Glucose-Capillary: 158 mg/dL — ABNORMAL HIGH (ref 70–99)

## 2023-10-09 LAB — CBG MONITORING, ED: Glucose-Capillary: 33 mg/dL — CL (ref 70–99)

## 2023-10-09 MED ORDER — DEXTROSE 50 % IV SOLN
1.0000 | Freq: Once | INTRAVENOUS | Status: AC
  Administered 2023-10-09: 50 mL via INTRAVENOUS
  Filled 2023-10-09: qty 50

## 2023-10-09 MED ORDER — APIXABAN 2.5 MG PO TABS
2.5000 mg | ORAL_TABLET | Freq: Two times a day (BID) | ORAL | Status: DC
  Administered 2023-10-09 – 2023-10-10 (×2): 2.5 mg via ORAL
  Filled 2023-10-09 (×3): qty 1

## 2023-10-09 MED ORDER — ACETAMINOPHEN 650 MG RE SUPP: 650.0000 mg | Freq: Four times a day (QID) | RECTAL | Status: DC | PRN

## 2023-10-09 MED ORDER — CARVEDILOL 12.5 MG PO TABS
12.5000 mg | ORAL_TABLET | Freq: Two times a day (BID) | ORAL | Status: DC
  Administered 2023-10-09 – 2023-10-10 (×2): 12.5 mg via ORAL
  Filled 2023-10-09 (×2): qty 1

## 2023-10-09 MED ORDER — ACETAMINOPHEN 325 MG PO TABS
650.0000 mg | ORAL_TABLET | Freq: Four times a day (QID) | ORAL | Status: DC | PRN
  Administered 2023-10-09 – 2023-10-10 (×2): 650 mg via ORAL
  Filled 2023-10-09 (×2): qty 2

## 2023-10-09 MED ORDER — ONDANSETRON HCL 4 MG/2ML IJ SOLN: 4.0000 mg | Freq: Four times a day (QID) | INTRAMUSCULAR | Status: DC | PRN

## 2023-10-09 MED ORDER — POTASSIUM CHLORIDE CRYS ER 10 MEQ PO TBCR
10.0000 meq | EXTENDED_RELEASE_TABLET | ORAL | Status: DC
  Administered 2023-10-10: 10 meq via ORAL
  Filled 2023-10-09: qty 1

## 2023-10-09 MED ORDER — SODIUM CHLORIDE 0.9 % IV SOLN
2.0000 g | INTRAVENOUS | Status: DC
  Administered 2023-10-10: 2 g via INTRAVENOUS
  Filled 2023-10-09: qty 20

## 2023-10-09 MED ORDER — SODIUM CHLORIDE 0.9 % IV BOLUS
500.0000 mL | Freq: Once | INTRAVENOUS | Status: AC
  Administered 2023-10-09: 500 mL via INTRAVENOUS

## 2023-10-09 MED ORDER — FUROSEMIDE 40 MG PO TABS
40.0000 mg | ORAL_TABLET | ORAL | Status: DC
  Administered 2023-10-09: 40 mg via ORAL
  Filled 2023-10-09: qty 1

## 2023-10-09 MED ORDER — SODIUM CHLORIDE 0.9 % IV SOLN
500.0000 mg | INTRAVENOUS | Status: DC
  Administered 2023-10-10: 500 mg via INTRAVENOUS
  Filled 2023-10-09: qty 5

## 2023-10-09 MED ORDER — ONDANSETRON HCL 4 MG PO TABS: 4.0000 mg | ORAL_TABLET | Freq: Four times a day (QID) | ORAL | Status: DC | PRN

## 2023-10-09 MED ORDER — IPRATROPIUM-ALBUTEROL 0.5-2.5 (3) MG/3ML IN SOLN: 3.0000 mL | Freq: Four times a day (QID) | RESPIRATORY_TRACT | Status: DC | PRN

## 2023-10-09 MED ORDER — NITROGLYCERIN 0.4 MG SL SUBL: 0.4000 mg | SUBLINGUAL_TABLET | SUBLINGUAL | Status: DC | PRN

## 2023-10-09 MED ORDER — SODIUM CHLORIDE 0.9 % IV SOLN
500.0000 mg | Freq: Once | INTRAVENOUS | Status: AC
  Administered 2023-10-09: 500 mg via INTRAVENOUS
  Filled 2023-10-09: qty 5

## 2023-10-09 MED ORDER — FEBUXOSTAT 40 MG PO TABS
40.0000 mg | ORAL_TABLET | Freq: Every day | ORAL | Status: DC
  Administered 2023-10-09: 40 mg via ORAL
  Filled 2023-10-09 (×2): qty 1

## 2023-10-09 MED ORDER — COLCHICINE 0.6 MG PO TABS
0.6000 mg | ORAL_TABLET | Freq: Two times a day (BID) | ORAL | Status: DC
  Administered 2023-10-09 – 2023-10-10 (×2): 0.6 mg via ORAL
  Filled 2023-10-09 (×3): qty 1

## 2023-10-09 MED ORDER — FERROUS SULFATE 325 (65 FE) MG PO TABS
325.0000 mg | ORAL_TABLET | ORAL | Status: DC
  Administered 2023-10-09: 325 mg via ORAL
  Filled 2023-10-09: qty 1

## 2023-10-09 MED ORDER — ACETAMINOPHEN 500 MG PO TABS
1000.0000 mg | ORAL_TABLET | Freq: Once | ORAL | Status: AC
  Administered 2023-10-09: 1000 mg via ORAL
  Filled 2023-10-09: qty 2

## 2023-10-09 MED ORDER — DOCUSATE SODIUM 100 MG PO CAPS
100.0000 mg | ORAL_CAPSULE | Freq: Two times a day (BID) | ORAL | Status: DC
  Administered 2023-10-10: 100 mg via ORAL
  Filled 2023-10-09 (×2): qty 1

## 2023-10-09 MED ORDER — SIMVASTATIN 20 MG PO TABS
40.0000 mg | ORAL_TABLET | Freq: Every day | ORAL | Status: DC
  Administered 2023-10-09: 40 mg via ORAL
  Filled 2023-10-09: qty 2

## 2023-10-09 MED ORDER — SODIUM CHLORIDE 0.9 % IV SOLN
2.0000 g | Freq: Once | INTRAVENOUS | Status: AC
  Administered 2023-10-09: 2 g via INTRAVENOUS
  Filled 2023-10-09: qty 20

## 2023-10-09 MED ORDER — DM-GUAIFENESIN ER 30-600 MG PO TB12
1.0000 | ORAL_TABLET | Freq: Two times a day (BID) | ORAL | Status: DC
  Administered 2023-10-09 – 2023-10-10 (×2): 1 via ORAL
  Filled 2023-10-09 (×3): qty 1

## 2023-10-09 MED ORDER — SACUBITRIL-VALSARTAN 24-26 MG PO TABS
1.0000 | ORAL_TABLET | Freq: Two times a day (BID) | ORAL | Status: DC
  Administered 2023-10-09 – 2023-10-10 (×2): 1 via ORAL
  Filled 2023-10-09 (×3): qty 1

## 2023-10-09 NOTE — Progress Notes (Signed)
 Elink is following code sepsis.

## 2023-10-09 NOTE — H&P (Signed)
 History and Physical    [name] RUE:[id number] DOB: 1927/01/22 DOA: 10/09/2023  PCP: Luke Bickers, MD   Patient coming from: ALF  Chief Complaint: Cough/shortness of breath x 1 week  HPI: [name] is a 89 y.o. male with medical history significant for CKD stage III, HFrEF, chronic biventricular heart failure, hypertension, CAD, paroxysmal atrial fibrillation, BPH, CHB status post Medtronic ICD, dyslipidemia, myelodysplasia, prostate cancer, left adrenal mass, B12 deficiency, and prior tobacco abuse who presented from [facility] ALF due to shortness of breath along with cough over the last week.  He states he has been coughing up some phlegm and denies any sick contacts.  He has been taking his medications otherwise and has had decent oral intake with no nausea, vomiting, or diarrhea.   ED Course: Vital signs with some mild temperature elevation 100.4 Fahrenheit, and respiratory rate of 21.  Currently requiring 2 L nasal cannula oxygen.  Initial lactic acid 2.2 which has come down to 1 after fluid bolus.  Procalcitonin 2.54.  Noted to be hypoglycemic with glucose of 33 and chest x-ray negative.  BNP 2245.  Review of Systems: Reviewed as noted above, otherwise negative.  Past Medical History:  Diagnosis Date   BPH (benign prostatic hypertrophy)    CHF (congestive heart failure) (HCC)    a. EF at 45-50% in 2016 b. EF at 40-45% by repeat echo in 12/2020   Chronic systolic heart failure (HCC)    Complete heart block [facility])    Medtronic pacemaker 04/2021 - Dr. Carolynne Citron   Coronary artery disease    a. Multivessel status post CABG 8/06, LIMA-LAD, SVG-CFX, SVG-distal RCA b. 12/2020: cath showing 3/3 patent grafts with 80% stenosis along LPAV prior to bifurication and medical management recommended.   Essential hypertension    Hyperlipidemia    Ischemic cardiomyopathy    Myelodysplasia, low grade (HCC) 11/27/2015   Nephrolithiasis    Prostate cancer (HCC)    Vitamin B 12 deficiency  07/27/2016    Past Surgical History:  Procedure Laterality Date   CARDIAC DEFIBRILLATOR PLACEMENT     COLONOSCOPY     COLONOSCOPY N/A 08/19/2013   Procedure: COLONOSCOPY;  Surgeon: Ruby Corporal, MD;  Location: [facility] ENDO SUITE;  Service: Endoscopy;  Laterality: N/A;  930   CORONARY ARTERY BYPASS GRAFT     8/06 with left anterior descending artery, saphenous vein graft to circumflex, saphenous vein graft to distal right coronary artery   CYSTOSCOPY/URETEROSCOPY/HOLMIUM LASER/STENT PLACEMENT Right 11/07/2020   Procedure: CYSTOSCOPY/URETEROSCOPY/HOLMIUM LASER/STENT PLACEMENT;  Surgeon: Osborn Blaze, MD;  Location: [facility] ORS;  Service: Urology;  Laterality: Right;   HERNIA REPAIR     LEFT HEART CATH AND CORS/GRAFTS ANGIOGRAPHY N/A 12/19/2020   Procedure: LEFT HEART CATH AND CORS/GRAFTS ANGIOGRAPHY;  Surgeon: Arleen Lacer, MD;  Location: [facility] INVASIVE CV LAB;  Service: Cardiovascular;  Laterality: N/A;   PACEMAKER IMPLANT N/A 04/24/2021   Procedure: PACEMAKER IMPLANT;  Surgeon: Tammie Fall, MD;  Location: [facility] INVASIVE CV LAB;  Service: Cardiovascular;  Laterality: N/A;   POLYPECTOMY     PROSTATE SURGERY     TEMPORARY PACEMAKER N/A 04/21/2021   Procedure: TEMPORARY PACEMAKER;  Surgeon: [place]az[place], Peter M, MD;  Location: [facility] INVASIVE CV LAB;  Service: Cardiovascular;  Laterality: N/A;     reports that he quit smoking about 31 years ago. His smoking use included cigarettes. He started smoking about 75 years ago. He has a 11 pack-year smoking history. He has never used smokeless tobacco. He reports  that he does not drink alcohol and does not use drugs.  Allergies  Allergen Reactions   Codeine Nausea And Vomiting    Family History  Problem Relation Age of Onset   Colon cancer Father    Coronary artery disease Other     Prior to Admission medications   Medication Sig Start Date End Date Taking? Authorizing Provider  acetaminophen  (TYLENOL ) 500 MG tablet Take 1,000 mg by mouth every 8 (eight)  hours as needed for mild pain (pain score 1-3).   Yes [provider]  apixaban  (ELIQUIS ) 2.5 MG TABS tablet Take 1 tablet (2.5 mg total) by mouth 2 (two) times daily. 09/04/22  Yes Tammie Fall, MD  Cyanocobalamin  (VITAMIN DEFICIENCY SYSTEM-B12) 1000 MCG/ML KIT Inject 1 mL into the skin every 30 (thirty) days. On the 22nd of each month   Yes [provider]  docusate sodium  (COLACE) 100 MG capsule Take 100 mg by mouth 2 (two) times daily.   Yes [provider]  febuxostat (ULORIC) 40 MG tablet Take 40 mg by mouth daily. 08/30/23  Yes [provider]  potassium chloride  (KLOR-CON ) 10 MEQ tablet Take 10 mEq by mouth daily. 09/26/23  Yes [provider]  sacubitril -valsartan  (ENTRESTO ) 24-26 MG Take 1 tablet by mouth 2 (two) times daily. 07/04/22  Yes Gerard Knight, MD  carvedilol  (COREG ) 12.5 MG tablet TAKE 1 TABLET(12.5 MG) BY MOUTH TWICE DAILY WITH A MEAL 04/09/22   Gerard Knight, MD  colchicine 0.6 MG tablet Take 0.6 mg by mouth 2 (two) times daily.    [provider]  ferrous sulfate 325 (65 FE) MG EC tablet Take 325 mg by mouth 3 (three) times a week. Monday, Wednesday, and Friday    [provider]  furosemide  (LASIX ) 40 MG tablet TAKE 1 TABLET(40 MG) BY MOUTH DAILY Patient taking differently: Take 40 mg by mouth every other day. 07/28/21   Gerard Knight, MD  LANTUS  SOLOSTAR 100 UNIT/ML Solostar Pen Inject 20 Units into the skin daily. 06/19/23   [provider]  nitroGLYCERIN  (NITROSTAT ) 0.4 MG SL tablet Place 1 tablet (0.4 mg total) under the tongue every 5 (five) minutes as needed. 06/08/19   Flo Hummingbird, PA-C  potassium chloride  (KLOR-CON  M) 10 MEQ tablet Take 10 mEq by mouth every other day.    [provider]  simvastatin  (ZOCOR ) 40 MG tablet TAKE 1 TABLET BY MOUTH EVERY DAY IN THE EVENING 05/22/22   Gerard Knight, MD    Physical Exam: Vitals:   10/09/23 0745 10/09/23 0751 10/09/23 0758  10/09/23 0900  BP: (!) 154/75   (!) 134/49  Pulse: (!) 59   63  Resp: (!) 24   (!) 21  Temp:   (!) 100.4 F (38 C)   TempSrc:   Rectal   SpO2: 99% (!) 88%  100%  Weight:  72.1 kg    Height:  5\' 6"  (1.676 m)      Constitutional: NAD, calm, comfortable Vitals:   10/09/23 0745 10/09/23 0751 10/09/23 0758 10/09/23 0900  BP: (!) 154/75   (!) 134/49  Pulse: (!) 59   63  Resp: (!) 24   (!) 21  Temp:   (!) 100.4 F (38 C)   TempSrc:   Rectal   SpO2: 99% (!) 88%  100%  Weight:  72.1 kg    Height:  5\' 6"  (1.676 m)     Eyes: lids and conjunctivae normal Neck: normal, supple Respiratory: Mild wheezing  bilaterally. Normal respiratory effort. No accessory muscle use.  2 L nasal cannula oxygen. Cardiovascular: Regular rate and rhythm, no murmurs. Abdomen: no tenderness, no distention. Bowel sounds positive.  Musculoskeletal:  No edema. Skin: no rashes, lesions, ulcers.  Psychiatric: Flat affect  Labs on Admission: I have personally reviewed following labs and imaging studies  CBC: Recent Labs  Lab 10/09/23 0812  WBC 5.1  NEUTROABS 1.5*  HGB 12.0*  HCT 38.8*  MCV 98.7  PLT 123*   Basic Metabolic Panel: Recent Labs  Lab 10/09/23 0812  NA 140  K 4.7  CL 103  CO2 24  GLUCOSE 56*  BUN 43*  CREATININE 1.59*  CALCIUM  8.8*   GFR: Estimated Creatinine Clearance: 25.6 mL/min (A) (by C-G formula based on SCr of 1.59 mg/dL (H)). Liver Function Tests: Recent Labs  Lab 10/09/23 0812  AST 22  ALT 14  ALKPHOS 63  BILITOT 0.7  PROT 6.7  ALBUMIN  3.2*   No results for input(s): "LIPASE", "AMYLASE" in the last 168 hours. No results for input(s): "AMMONIA" in the last 168 hours. Coagulation Profile: No results for input(s): "INR", "PROTIME" in the last 168 hours. Cardiac Enzymes: No results for input(s): "CKTOTAL", "CKMB", "CKMBINDEX", "TROPONINI" in the last 168 hours. BNP (last 3 results) No results for input(s): "PROBNP" in the last 8760 hours. HbA1C: No results for  input(s): "HGBA1C" in the last 72 hours. CBG: Recent Labs  Lab 10/09/23 1030  GLUCAP 33*   Lipid Profile: No results for input(s): "CHOL", "HDL", "LDLCALC", "TRIG", "CHOLHDL", "LDLDIRECT" in the last 72 hours. Thyroid  Function Tests: No results for input(s): "TSH", "T4TOTAL", "FREET4", "T3FREE", "THYROIDAB" in the last 72 hours. Anemia Panel: No results for input(s): "VITAMINB12", "FOLATE", "FERRITIN", "TIBC", "IRON", "RETICCTPCT" in the last 72 hours. Urine analysis:    Component Value Date/Time   COLORURINE AMBER (A) 06/22/2023 1349   APPEARANCEUR CLOUDY (A) 06/22/2023 1349   LABSPEC 1.022 06/22/2023 1349   PHURINE 5.0 06/22/2023 1349   GLUCOSEU 50 (A) 06/22/2023 1349   HGBUR MODERATE (A) 06/22/2023 1349   BILIRUBINUR NEGATIVE 06/22/2023 1349   KETONESUR 20 (A) 06/22/2023 1349   PROTEINUR >=300 (A) 06/22/2023 1349   NITRITE NEGATIVE 06/22/2023 1349   LEUKOCYTESUR NEGATIVE 06/22/2023 1349    Radiological Exams on Admission: DG Chest Port 1 View Result Date: 10/09/2023 CLINICAL DATA:  Cough, short of breath since yesterday EXAM: PORTABLE CHEST 1 VIEW COMPARISON:  09/06/2023 FINDINGS: Single frontal view of the chest demonstrates stable AICD. Cardiac silhouette is unremarkable. No acute airspace disease, effusion, or pneumothorax. No acute bony abnormalities. IMPRESSION: 1. No acute intrathoracic process. Electronically Signed   By: Bobbye Burrow M.D.   On: 10/09/2023 08:24    EKG: Independently reviewed.  Dual paced rhythm 62 bpm.  Assessment/Plan Principal Problem:   Pneumonia Active Problems:   Other hyperlipidemia   Essential hypertension   Coronary artery disease   Automatic implantable cardioverter-defibrillator in situ   Acute respiratory failure with hypoxia (HCC)   Adrenal carcinoma, left (HCC)   DNR (do not resuscitate)   Hyperglycemia   Type 2 diabetes mellitus with vascular disease (HCC)    Possible community-acquired pneumonia versus bronchitis -  Continue on antibiotics with Rocephin  and azithromycin  given procalcitonin elevation - Check respiratory panel - Antitussives/mucolytics - Breathing treatments as needed  Multivessel CAD status post CABG 2006 - Continue on Eliquis  - Zocor  40 mg daily - As needed nitroglycerin  - Follows with cardiology Dr. Londa Rival outpatient  CKD stage IIIb - Creatinine is near  baseline around 1.4-1.8  HFrEF - LVEF 35% - Continue Coreg  12.5 mg daily - Lasix  20 mg daily with potassium supplement - Entresto  24/26 mg twice daily  Type 2 diabetes with hypoglycemia - Monitor carefully on carb modified diet - SSI only  Complete heart block with Medtronic pacemaker - Follows with Dr. Carolynne Citron  Paroxysmal atrial fibrillation/flutter - Monitor on telemetry - Eliquis  2.5 mg twice daily  Left adrenal mass and low-grade MDS - Follows with Dr. Cheree Cords - Continue iron supplementation with stool softener - Noted to have chronic thrombocytopenia that is associated  Tobacco abuse in remission - Quit over 20 years ago   DVT prophylaxis: Eliquis  Code Status: DNR Family Communication: Son at bedside 5/28 Disposition Plan: Admit due to concern for pneumonia Consults called: None Admission status: Observation, telemetry  Severity of Illness: The appropriate patient status for this patient is OBSERVATION. Observation status is judged to be reasonable and necessary in order to provide the required intensity of service to ensure the patient's safety. The patient's presenting symptoms, physical exam findings, and initial radiographic and laboratory data in the context of their medical condition is felt to place them at decreased risk for further clinical deterioration. Furthermore, it is anticipated that the patient will be medically stable for discharge from the hospital within 2 midnights of admission.    Klay Sobotka D Mason Sole DO [facility]  If 7PM-7AM, please contact  night-coverage [email or web address]  10/09/2023, 1:32 PM

## 2023-10-09 NOTE — TOC Initial Note (Signed)
 Transition of Care [facility]) - Initial/Assessment Note   Patient Details  Name: [name] MRN: [id number] Date of Birth: 09-06-1926  [facility]) CM/SW Contact:    Geraldina Klinefelter, RN Phone Number: 10/09/2023, 9:09 PM  Clinical Narrative:                 From [facility]. Plans to return there at dc. Has cane, std, walker, shower seat. Per pt, no O2 at baseline.   Expected Discharge Plan: Assisted Living Barriers to Discharge: Continued Medical Work up  Patient Goals and CMS Choice Patient states their goals for this hospitalization and ongoing recovery are:: Ease of breath. CMS Medicare.gov Compare Post Acute Care list provided to:: Patient Choice offered to / list presented to : Patient [place] ownership interest in [facility].provided to:: Patient   Expected Discharge Plan and Services In-house Referral: Clinical Social Work Discharge Planning Services: CM Consult   Living arrangements for the past 2 months: Assisted Living Facility   Prior Living Arrangements/Services Living arrangements for the past 2 months: Assisted Living Facility Lives with:: Facility Resident Patient language and need for interpreter reviewed:: Yes Do you feel safe going back to the place where you live?: Yes      Need for Family Participation in Patient Care: No (Comment) Care giver support system in place?: Yes (comment) Current home services: DME (Cand, std walker, shower seat.) Criminal Activity/Legal Involvement Pertinent to Current Situation/Hospitalization: No - Comment as needed  Activities of Daily Living   ADL Screening (condition at time of admission) Independently performs ADLs?: No Does the patient have a NEW difficulty with bathing/dressing/toileting/self-feeding that is expected to last >3 days?: Yes (Initiates electronic notice to provider for possible OT consult) Does the patient have a NEW difficulty with getting in/out of bed, walking, or  climbing stairs that is expected to last >3 days?: Yes (Initiates electronic notice to provider for possible PT consult) Does the patient have a NEW difficulty with communication that is expected to last >3 days?: No Is the patient deaf or have difficulty hearing?: No Does the patient have difficulty seeing, even when wearing glasses/contacts?: No Does the patient have difficulty concentrating, remembering, or making decisions?: No  Permission Sought/Granted Permission sought to share information with : Case Manager, Magazine features editor Permission granted to share information with : Yes, Verbal Permission Granted  Emotional Assessment   Attitude/Demeanor/Rapport: Engaged Affect (typically observed): Appropriate Orientation: : Oriented to Self, Oriented to Place, Oriented to Situation Alcohol / Substance Use: Not Applicable Psych Involvement: No (comment)  Admission diagnosis:  Pneumonia [J18.9] Pneumonia due to infectious organism, unspecified laterality, unspecified part of lung [J18.9] Patient Active Problem List   Diagnosis Date Noted   Pneumonia 10/09/2023   Pneumonia due to COVID-19 virus 05/22/2022   DNR (do not resuscitate) 05/22/2022   Hyperglycemia 05/22/2022   Type 2 diabetes mellitus with vascular disease (HCC) 05/22/2022   Adrenal carcinoma, left (HCC) 12/14/2021   Left adrenal mass (HCC) 11/27/2021   Pressure injury of skin 04/20/2021   Bradycardia 04/19/2021   Heart block AV complete (HCC) 04/18/2021   Acute combined systolic and diastolic congestive heart failure (HCC)    Sepsis due to undetermined organism (HCC) 04/13/2021   Lobar pneumonia (HCC) 04/13/2021   Acute respiratory failure with hypoxia and hypercarbia (HCC) 04/13/2021   Acute on chronic congestive heart failure (HCC) 04/12/2021   Acute respiratory failure with hypoxia (HCC) 04/12/2021   Unstable angina (HCC)  Family history of CABG    Chest pain 12/16/2020   Acute renal failure  superimposed on stage 3 chronic kidney disease, unspecified acute renal failure type, unspecified whether stage 3a or 3b CKD (HCC) 11/07/2020   AKI (acute kidney injury) (HCC) 11/07/2020   Acute renal failure (ARF) (HCC) 11/07/2020   Gout involving toe 05/26/2019   Vitamin B 12 deficiency 07/27/2016   Myelodysplasia, low grade (HCC) 11/27/2015   Other primary cardiomyopathies 08/07/2011   Carotid stenosis 07/26/2011   Automatic implantable cardioverter-defibrillator in situ 07/26/2009   CAD, AUTOLOGOUS BYPASS GRAFT 08/24/2008   PROSTATE CANCER 08/03/2008   Other hyperlipidemia 08/03/2008   Essential hypertension 08/03/2008   MYOCARDIAL INFARCTION, HX OF 08/03/2008   Coronary artery disease 08/03/2008   Ischemic cardiomyopathy 08/03/2008   SYSTOLIC HEART FAILURE, CHRONIC 08/03/2008   NEPHROLITHIASIS, HX OF 08/03/2008   POSTSURGICAL AORTOCORONARY BYPASS STATUS 08/03/2008   PCP:  Omie Bickers, MD Pharmacy:   [facility] - [place], [place] - [address] [place] [address] Phone: [phone] Fax: [phone]     Social Drivers of Health (SDOH) Social History: SDOH Screenings   Food Insecurity: No Food Insecurity (10/09/2023)  Housing: Low Risk  (10/09/2023)  Transportation Needs: No Transportation Needs (10/09/2023)  Utilities: Not At Risk (10/09/2023)  Depression (PHQ2-9): Low Risk  (05/26/2019)  Social Connections: Unknown (10/09/2023)  Tobacco Use: Medium Risk (10/09/2023)   SDOH Interventions:  Readmission Risk Interventions    04/25/2021    1:30 PM 04/24/2021    4:26 PM 04/17/2021    3:57 PM  Readmission Risk Prevention Plan  Transportation Screening Complete Complete Complete  Medication Review Oceanographer)   Complete  PCP or Specialist appointment within 3-5 days of discharge   Complete  HRI or Home Care Consult Complete Complete Complete  SW Recovery Care/Counseling Consult Complete Complete Complete   Palliative Care Screening Complete  Not Applicable  Skilled Nursing Facility Complete Complete Complete

## 2023-10-09 NOTE — Inpatient Diabetes Management (Signed)
 Inpatient Diabetes Program Recommendations  AACE/ADA: New Consensus Statement on Inpatient Glycemic Control   Target Ranges:  Prepandial:   less than 140 mg/dL      Peak postprandial:   less than 180 mg/dL (1-2 hours)      Critically ill patients:  140 - 180 mg/dL    Latest Reference Range & Units 10/09/23 08:12  Glucose 70 - 99 mg/dL 56 (L)    Review of Glycemic Control  Diabetes history: DM2 Outpatient Diabetes medications: Lantus  20 units daily Current orders for Inpatient glycemic control: None; in ED  Inpatient Diabetes Program Recommendations:    Glucose: Lab glucose 56 mg/dl at 1:61 am today. Please check CBG and treat for hypoglycemia if CBG less than 70 mg/dl.  Thanks, Beacher Limerick, RN, MSN, CDCES Diabetes Coordinator Inpatient Diabetes Program [phone] (Team Pager from 8am to 5pm)

## 2023-10-09 NOTE — Care Management Obs Status (Signed)
 MEDICARE OBSERVATION STATUS NOTIFICATION   Patient Details  Name: [name] MRN: [id number] Date of Birth: 1926-12-09   Medicare Observation Status Notification Given:  Yes    Geraldina Klinefelter, RN 10/09/2023, 5:27 PM

## 2023-10-09 NOTE — ED Provider Notes (Signed)
 [place] EMERGENCY DEPARTMENT AT [facility] Provider Note  CSN: [id number] Arrival date & time: 10/09/23 4098  Chief Complaint(s) Shortness of Breath  HPI [name] is a 89 y.o. male history of BPH, CHF, heart block status post pacemaker, coronary artery disease, atrial fibrillation on Eliquis  presenting to the emergency department with shortness of breath.  Patient reports shortness of breath starting yesterday.  Reports he has been coughing.  Denies fevers but has been having chills.  Reports feeling very weak.  No chest pain.  Has not noticed any leg swelling.   Past Medical History Past Medical History:  Diagnosis Date   BPH (benign prostatic hypertrophy)    CHF (congestive heart failure) (HCC)    a. EF at 45-50% in 2016 b. EF at 40-45% by repeat echo in 12/2020   Chronic systolic heart failure (HCC)    Complete heart block [facility])    [employer]tr[employer] pacemaker 04/2021 - Dr. Carolynne Citron   Coronary artery disease    a. Multivessel status post CABG 8/06, LIMA-LAD, SVG-CFX, SVG-distal RCA b. 12/2020: cath showing 3/3 patent grafts with 80% stenosis along LPAV prior to bifurication and medical management recommended.   Essential hypertension    Hyperlipidemia    Ischemic cardiomyopathy    Myelodysplasia, low grade (HCC) 11/27/2015   Nephrolithiasis    Prostate cancer (HCC)    Vitamin B 12 deficiency 07/27/2016   Patient Active Problem List   Diagnosis Date Noted   Pneumonia 10/09/2023   Pneumonia due to COVID-19 virus 05/22/2022   DNR (do not resuscitate) 05/22/2022   Hyperglycemia 05/22/2022   Type 2 diabetes mellitus with vascular disease (HCC) 05/22/2022   Adrenal carcinoma, left (HCC) 12/14/2021   Left adrenal mass (HCC) 11/27/2021   Pressure injury of skin 04/20/2021   Bradycardia 04/19/2021   Heart block AV complete (HCC) 04/18/2021   Acute combined systolic and diastolic congestive heart failure (HCC)    Sepsis due to undetermined organism (HCC) 04/13/2021    Lobar pneumonia (HCC) 04/13/2021   Acute respiratory failure with hypoxia and hypercarbia (HCC) 04/13/2021   Acute on chronic congestive heart failure (HCC) 04/12/2021   Acute respiratory failure with hypoxia (HCC) 04/12/2021   Unstable angina (HCC)    Family history of CABG    Chest pain 12/16/2020   Acute renal failure superimposed on stage 3 chronic kidney disease, unspecified acute renal failure type, unspecified whether stage 3a or 3b CKD (HCC) 11/07/2020   AKI (acute kidney injury) (HCC) 11/07/2020   Acute renal failure (ARF) (HCC) 11/07/2020   Gout involving toe 05/26/2019   Vitamin B 12 deficiency 07/27/2016   Myelodysplasia, low grade (HCC) 11/27/2015   Other primary cardiomyopathies 08/07/2011   Carotid stenosis 07/26/2011   Automatic implantable cardioverter-defibrillator in situ 07/26/2009   CAD, AUTOLOGOUS BYPASS GRAFT 08/24/2008   PROSTATE CANCER 08/03/2008   Other hyperlipidemia 08/03/2008   Essential hypertension 08/03/2008   MYOCARDIAL INFARCTION, HX OF 08/03/2008   Coronary artery disease 08/03/2008   Ischemic cardiomyopathy 08/03/2008   SYSTOLIC HEART FAILURE, CHRONIC 08/03/2008   NEPHROLITHIASIS, HX OF 08/03/2008   POSTSURGICAL AORTOCORONARY BYPASS STATUS 08/03/2008   Home Medication(s) Prior to Admission medications   Medication Sig Start Date End Date Taking? Authorizing Provider  potassium chloride  (KLOR-CON ) 10 MEQ tablet Take 10 mEq by mouth daily. 09/26/23  Yes [provider]  acetaminophen  (TYLENOL ) 500 MG tablet Take 1,000 mg by mouth every 8 (eight) hours as needed for mild pain (pain score 1-3).    [provider]  apixaban  (ELIQUIS ) 2.5 MG TABS tablet Take 1 tablet (2.5 mg total) by mouth 2 (two) times daily. 09/04/22   Tammie Fall, MD  carvedilol  (COREG ) 12.5 MG tablet TAKE 1 TABLET(12.5 MG) BY MOUTH TWICE DAILY WITH A MEAL 04/09/22   Gerard Knight, MD  colchicine 0.6 MG tablet Take 0.6 mg by mouth 2 (two) times daily.     [provider]  Cyanocobalamin  (VITAMIN DEFICIENCY SYSTEM-B12) 1000 MCG/ML KIT Inject 1 mL into the skin every 30 (thirty) days. On the 22nd of each month    [provider]  docusate sodium  (COLACE) 100 MG capsule Take 100 mg by mouth 2 (two) times daily.    [provider]  febuxostat (ULORIC) 40 MG tablet Take 40 mg by mouth daily. 08/30/23   [provider]  ferrous sulfate 325 (65 FE) MG EC tablet Take 325 mg by mouth 3 (three) times a week. Monday, Wednesday, and Friday    [provider]  furosemide  (LASIX ) 40 MG tablet TAKE 1 TABLET(40 MG) BY MOUTH DAILY Patient taking differently: Take 40 mg by mouth every other day. 07/28/21   Gerard Knight, MD  LANTUS  SOLOSTAR 100 UNIT/ML Solostar Pen Inject 20 Units into the skin daily. 06/19/23   [provider]  nitroGLYCERIN  (NITROSTAT ) 0.4 MG SL tablet Place 1 tablet (0.4 mg total) under the tongue every 5 (five) minutes as needed. 06/08/19   Flo Hummingbird, PA-C  potassium chloride  (KLOR-CON  M) 10 MEQ tablet Take 10 mEq by mouth every other day.    [provider]  sacubitril -valsartan  (ENTRESTO ) 24-26 MG Take 1 tablet by mouth 2 (two) times daily. 07/04/22   Gerard Knight, MD  simvastatin  (ZOCOR ) 40 MG tablet TAKE 1 TABLET BY MOUTH EVERY DAY IN THE EVENING 05/22/22   Gerard Knight, MD                                                                                                                                    Past Surgical History Past Surgical History:  Procedure Laterality Date   CARDIAC DEFIBRILLATOR PLACEMENT     COLONOSCOPY     COLONOSCOPY N/A 08/19/2013   Procedure: COLONOSCOPY;  Surgeon: Ruby Corporal, MD;  Location: [facility] SUITE;  Service: Endoscopy;  Laterality: N/A;  930   CORONARY ARTERY BYPASS GRAFT     8/06 with left anterior descending artery, saphenous vein graft to circumflex, saphenous vein graft to distal right coronary artery    CYSTOSCOPY/URETEROSCOPY/HOLMIUM LASER/STENT PLACEMENT Right 11/07/2020   Procedure: CYSTOSCOPY/URETEROSCOPY/HOLMIUM LASER/STENT PLACEMENT;  Surgeon: Osborn Blaze, MD;  Location: [facility] ORS;  Service: Urology;  Laterality: Right;   HERNIA REPAIR     LEFT HEART CATH AND CORS/GRAFTS ANGIOGRAPHY N/A 12/19/2020   Procedure: LEFT HEART CATH AND CORS/GRAFTS ANGIOGRAPHY;  Surgeon: Arleen Lacer, MD;  Location: [facility] INVASIVE CV LAB;  Service: Cardiovascular;  Laterality: N/A;  PACEMAKER IMPLANT N/A 04/24/2021   Procedure: PACEMAKER IMPLANT;  Surgeon: Tammie Fall, MD;  Location: [facility] INVASIVE CV LAB;  Service: Cardiovascular;  Laterality: N/A;   POLYPECTOMY     PROSTATE SURGERY     TEMPORARY PACEMAKER N/A 04/21/2021   Procedure: TEMPORARY PACEMAKER;  Surgeon: Swaz[place], Peter M, MD;  Location: [facility] INVASIVE CV LAB;  Service: Cardiovascular;  Laterality: N/A;   Family History Family History  Problem Relation Age of Onset   Colon cancer Father    Coronary artery disease Other     Social History Social History   Tobacco Use   Smoking status: Former    Current packs/day: 0.00    Average packs/day: 0.3 packs/day for 44.0 years (11.0 ttl pk-yrs)    Types: Cigarettes    Start date: 05/14/1948    Quit date: 05/14/1992    Years since quitting: 31.4   Smokeless tobacco: Never  Vaping Use   Vaping status: Never Used  Substance Use Topics   Alcohol use: No   Drug use: No   Allergies Codeine  Review of Systems Review of Systems  All other systems reviewed and are negative.   Physical Exam Vital Signs  I have reviewed the triage vital signs BP (!) 134/49   Pulse 63   Temp (!) 100.4 F (38 C) (Rectal)   Resp (!) 21   Ht 5\' 6"  (1.676 m)   Wt 72.1 kg   SpO2 100%   BMI 25.66 kg/m  Physical Exam Vitals and nursing note reviewed.  Constitutional:      General: He is in acute distress.     Appearance: Normal appearance. He is ill-appearing.     Comments: Actively rigoring  HENT:      Mouth/Throat:     Mouth: Mucous membranes are moist.  Eyes:     Conjunctiva/sclera: Conjunctivae normal.  Neck:     Vascular: No JVD.  Cardiovascular:     Rate and Rhythm: Normal rate and regular rhythm.  Pulmonary:     Comments: Increased work of breathing, tachypnea, not speaking in full sentences. Diffuse rhonchi R >L. No crackles Abdominal:     General: Abdomen is flat.     Palpations: Abdomen is soft.     Tenderness: There is no abdominal tenderness.  Musculoskeletal:     Right lower leg: No edema.     Left lower leg: No edema.  Skin:    General: Skin is warm and dry.     Capillary Refill: Capillary refill takes less than 2 seconds.  Neurological:     Mental Status: He is alert and oriented to person, place, and time. Mental status is at baseline.  Psychiatric:        Mood and Affect: Mood normal.        Behavior: Behavior normal.     ED Results and Treatments Labs (all labs ordered are listed, but only abnormal results are displayed) Labs Reviewed  COMPREHENSIVE METABOLIC PANEL WITH GFR - Abnormal; Notable for the following components:      Result Value   Glucose, Bld 56 (*)    BUN 43 (*)    Creatinine, Ser 1.59 (*)    Calcium  8.8 (*)    Albumin  3.2 (*)    GFR, Estimated 40 (*)    All other components within normal limits  CBC WITH DIFFERENTIAL/PLATELET - Abnormal; Notable for the following components:   RBC 3.93 (*)    Hemoglobin 12.0 (*)    HCT 38.8 (*)  Platelets 123 (*)    Neutro Abs 1.5 (*)    Monocytes Absolute 2.4 (*)    All other components within normal limits  BRAIN NATRIURETIC PEPTIDE - Abnormal; Notable for the following components:   B Natriuretic Peptide 2,245.0 (*)    All other components within normal limits  BLOOD GAS, VENOUS - Abnormal; Notable for the following components:   pCO2, Ven 62 (*)    pO2, Ven <31 (*)    All other components within normal limits  LACTIC ACID, PLASMA - Abnormal; Notable for the following components:   Lactic  Acid, Venous 2.2 (*)    All other components within normal limits  CBG MONITORING, ED - Abnormal; Notable for the following components:   Glucose-Capillary 33 (*)    All other components within normal limits  TROPONIN I (HIGH SENSITIVITY) - Abnormal; Notable for the following components:   Troponin I (High Sensitivity) 55 (*)    All other components within normal limits  TROPONIN I (HIGH SENSITIVITY) - Abnormal; Notable for the following components:   Troponin I (High Sensitivity) 52 (*)    All other components within normal limits  RESP PANEL BY RT-PCR (RSV, FLU A&B, COVID)  RVPGX2  CULTURE, BLOOD (ROUTINE X 2)  CULTURE, BLOOD (ROUTINE X 2)  RESPIRATORY PANEL BY PCR  LACTIC ACID, PLASMA  PROCALCITONIN  CBG MONITORING, ED                                                                                                                          Radiology DG Chest Port 1 View Result Date: 10/09/2023 CLINICAL DATA:  Cough, short of breath since yesterday EXAM: PORTABLE CHEST 1 VIEW COMPARISON:  09/06/2023 FINDINGS: Single frontal view of the chest demonstrates stable AICD. Cardiac silhouette is unremarkable. No acute airspace disease, effusion, or pneumothorax. No acute bony abnormalities. IMPRESSION: 1. No acute intrathoracic process. Electronically Signed   By: Bobbye Burrow M.D.   On: 10/09/2023 08:24    Pertinent labs & imaging results that were available during my care of the patient were reviewed by me and considered in my medical decision making (see MDM for details).  Medications Ordered in ED Medications  acetaminophen  (TYLENOL ) tablet 1,000 mg (1,000 mg Oral Given 10/09/23 1610)  cefTRIAXone  (ROCEPHIN ) 2 g in sodium chloride  0.9 % 100 mL IVPB (0 g Intravenous Stopped 10/09/23 0904)  azithromycin  (ZITHROMAX ) 500 mg in sodium chloride  0.9 % 250 mL IVPB (0 mg Intravenous Stopped 10/09/23 1039)  sodium chloride  0.9 % bolus 500 mL (0 mLs Intravenous Stopped 10/09/23 0904)  sodium  chloride 0.9 % bolus 500 mL (500 mLs Intravenous New Bag/Given 10/09/23 1039)  dextrose  50 % solution 50 mL (50 mLs Intravenous Given 10/09/23 1040)  Procedures Procedures  (including critical care time)  Medical Decision Making / ED Course   MDM:  89 year old presenting to the emergency department with shortness of breath.  Suspect likely due to underlying infection.  Patient noted to be febrile.  Has diffuse rhonchi on exam.  Also seems to be right urine during examination.  Will obtain blood cultures and treat with antibiotics, obtain x-ray, obtain flu and COVID testing.  Patient was hypoxic to 80% with EMS and hypoxic to 88% on room air here.  Will be placed on oxygen.  Does not typically use oxygen.  Considered other causes of shortness of breath, such as CHF, patient does not appear volume overloaded on exam.  Considered pulmonary embolism but no chest pain, tachycardia, patient on chronic Eliquis .  Patient does not have history of COPD.  Doubt pneumothorax but will check chest x-ray.  Will reassess.  Patient will need to be admitted given new hypoxia.  Clinical Course as of 10/09/23 1132  Wed Oct 09, 2023  1130 Patient developed hypoglycemia.  He is awake and alert.  He was able to eat some juice and got an amp of D50.  Workup otherwise notable for elevated lactic acidosis which improved after fluids.  Did not receive full sepsis bolus of fluids given his systolic CHF.  He did receive antibiotics and blood cultures were obtained.  Patient admitted to hospitalist. [WS]    Clinical Course User Index [WS] Mordecai Applebaum, MD     Additional history obtained: -Additional history obtained from family and ems -External records from outside source obtained and reviewed including: Chart review including previous notes, labs, imaging, consultation notes  including prior notes    Lab Tests: -I ordered, reviewed, and interpreted labs.   The pertinent results include:   Labs Reviewed  COMPREHENSIVE METABOLIC PANEL WITH GFR - Abnormal; Notable for the following components:      Result Value   Glucose, Bld 56 (*)    BUN 43 (*)    Creatinine, Ser 1.59 (*)    Calcium  8.8 (*)    Albumin  3.2 (*)    GFR, Estimated 40 (*)    All other components within normal limits  CBC WITH DIFFERENTIAL/PLATELET - Abnormal; Notable for the following components:   RBC 3.93 (*)    Hemoglobin 12.0 (*)    HCT 38.8 (*)    Platelets 123 (*)    Neutro Abs 1.5 (*)    Monocytes Absolute 2.4 (*)    All other components within normal limits  BRAIN NATRIURETIC PEPTIDE - Abnormal; Notable for the following components:   B Natriuretic Peptide 2,245.0 (*)    All other components within normal limits  BLOOD GAS, VENOUS - Abnormal; Notable for the following components:   pCO2, Ven 62 (*)    pO2, Ven <31 (*)    All other components within normal limits  LACTIC ACID, PLASMA - Abnormal; Notable for the following components:   Lactic Acid, Venous 2.2 (*)    All other components within normal limits  CBG MONITORING, ED - Abnormal; Notable for the following components:   Glucose-Capillary 33 (*)    All other components within normal limits  TROPONIN I (HIGH SENSITIVITY) - Abnormal; Notable for the following components:   Troponin I (High Sensitivity) 55 (*)    All other components within normal limits  TROPONIN I (HIGH SENSITIVITY) - Abnormal; Notable for the following components:   Troponin I (High Sensitivity) 52 (*)    All other components within  normal limits  RESP PANEL BY RT-PCR (RSV, FLU A&B, COVID)  RVPGX2  CULTURE, BLOOD (ROUTINE X 2)  CULTURE, BLOOD (ROUTINE X 2)  RESPIRATORY PANEL BY PCR  LACTIC ACID, PLASMA  PROCALCITONIN  CBG MONITORING, ED    Notable for lactic acidosis, hypoglycemia, elevated BNP  EKG   EKG Interpretation Date/Time:  Wednesday  Oct 09 2023 07:39:19 EDT Ventricular Rate:  62 PR Interval:    QRS Duration:  178 QT Interval:  503 QTC Calculation: 511 R Axis:   227  Text Interpretation: AV dual-paced rhythm Premature ventricular complexes Confirmed by Hiawatha Lout ([id number]) on 10/09/2023 8:06:44 AM         Imaging Studies ordered: I ordered imaging studies including CXR On my interpretation imaging demonstrates no acute process I independently visualized and interpreted imaging. I agree with the radiologist interpretation   Medicines ordered and prescription drug management: Meds ordered this encounter  Medications   acetaminophen  (TYLENOL ) tablet 1,000 mg   cefTRIAXone  (ROCEPHIN ) 2 g in sodium chloride  0.9 % 100 mL IVPB    Antibiotic Indication::   CAP   azithromycin  (ZITHROMAX ) 500 mg in sodium chloride  0.9 % 250 mL IVPB    Antibiotic Indication::   CAP   sodium chloride  0.9 % bolus 500 mL   sodium chloride  0.9 % bolus 500 mL   dextrose  50 % solution 50 mL    -I have reviewed the patients home medicines and have made adjustments as needed   Consultations Obtained: I requested consultation with the hospitalist,  and discussed lab and imaging findings as well as pertinent plan - they recommend: admission   Cardiac Monitoring: The patient was maintained on a cardiac monitor.  I personally viewed and interpreted the cardiac monitored which showed an underlying rhythm of: NSR  Reevaluation: After the interventions noted above, I reevaluated the patient and found that their symptoms have improved  Co morbidities that complicate the patient evaluation  Past Medical History:  Diagnosis Date   BPH (benign prostatic hypertrophy)    CHF (congestive heart failure) (HCC)    a. EF at 45-50% in 2016 b. EF at 40-45% by repeat echo in 12/2020   Chronic systolic heart failure (HCC)    Complete heart block [facility])    Medtronic pacemaker 04/2021 - Dr. Carolynne Citron   Coronary artery disease    a. Multivessel  status post CABG 8/06, LIMA-LAD, SVG-CFX, SVG-distal RCA b. 12/2020: cath showing 3/3 patent grafts with 80% stenosis along LPAV prior to bifurication and medical management recommended.   Essential hypertension    Hyperlipidemia    Ischemic cardiomyopathy    Myelodysplasia, low grade (HCC) 11/27/2015   Nephrolithiasis    Prostate cancer (HCC)    Vitamin B 12 deficiency 07/27/2016      Dispostion: Disposition decision including need for hospitalization was considered, and patient admitted to the hospital.    Final Clinical Impression(s) / ED Diagnoses Final diagnoses:  Pneumonia due to infectious organism, unspecified laterality, unspecified part of lung     This chart was dictated using voice recognition software.  Despite best efforts to proofread,  errors can occur which can change the documentation meaning.    Mordecai Applebaum, MD 10/09/23 [phone]

## 2023-10-09 NOTE — ED Notes (Signed)
 Date and time results received: 10/09/23 0831 (use smartphrase ".now" to insert current time)  Test: lactic acid  Critical Value: 2.2  Name of Provider Notified: Isaiah Marc MD  Orders Received? Or Actions Taken?: n/a

## 2023-10-09 NOTE — ED Triage Notes (Signed)
 Pt BIB [facility] from [facility] for sob and cough that started yesterday  Pt denies any pain

## 2023-10-10 DIAGNOSIS — J209 Acute bronchitis, unspecified: Secondary | ICD-10-CM | POA: Diagnosis not present

## 2023-10-10 DIAGNOSIS — J189 Pneumonia, unspecified organism: Secondary | ICD-10-CM | POA: Diagnosis not present

## 2023-10-10 LAB — CBC
HCT: 34.3 % — ABNORMAL LOW (ref 39.0–52.0)
Hemoglobin: 10.4 g/dL — ABNORMAL LOW (ref 13.0–17.0)
MCH: 29.8 pg (ref 26.0–34.0)
MCHC: 30.3 g/dL (ref 30.0–36.0)
MCV: 98.3 fL (ref 80.0–100.0)
Platelets: 90 10*3/uL — ABNORMAL LOW (ref 150–400)
RBC: 3.49 MIL/uL — ABNORMAL LOW (ref 4.22–5.81)
RDW: 15 % (ref 11.5–15.5)
WBC: 3.7 10*3/uL — ABNORMAL LOW (ref 4.0–10.5)
nRBC: 0 % (ref 0.0–0.2)

## 2023-10-10 LAB — RESPIRATORY PANEL BY PCR

## 2023-10-10 LAB — URINALYSIS, ROUTINE W REFLEX MICROSCOPIC
Bilirubin Urine: NEGATIVE
Glucose, UA: NEGATIVE mg/dL
Ketones, ur: NEGATIVE mg/dL
Leukocytes,Ua: NEGATIVE
Nitrite: NEGATIVE
Protein, ur: 100 mg/dL — AB
Specific Gravity, Urine: 1.013 (ref 1.005–1.030)
pH: 5 (ref 5.0–8.0)

## 2023-10-10 LAB — HEMOGLOBIN A1C
Hgb A1c MFr Bld: 5.7 % — ABNORMAL HIGH (ref 4.8–5.6)
Mean Plasma Glucose: 116.89 mg/dL

## 2023-10-10 LAB — BASIC METABOLIC PANEL WITH GFR
Anion gap: 11 (ref 5–15)
BUN: 42 mg/dL — ABNORMAL HIGH (ref 8–23)
CO2: 25 mmol/L (ref 22–32)
Calcium: 8.3 mg/dL — ABNORMAL LOW (ref 8.9–10.3)
Chloride: 103 mmol/L (ref 98–111)
Creatinine, Ser: 1.48 mg/dL — ABNORMAL HIGH (ref 0.61–1.24)
GFR, Estimated: 44 mL/min — ABNORMAL LOW (ref >60)
Glucose, Bld: 112 mg/dL — ABNORMAL HIGH (ref 70–99)
Potassium: 4.5 mmol/L (ref 3.5–5.1)
Sodium: 139 mmol/L (ref 135–145)

## 2023-10-10 LAB — MAGNESIUM: Magnesium: 2 mg/dL (ref 1.7–2.4)

## 2023-10-10 MED ORDER — INSULIN ASPART 100 UNIT/ML IJ SOLN: 0.0000 [IU] | Freq: Every day | INTRAMUSCULAR | Status: DC

## 2023-10-10 MED ORDER — AZITHROMYCIN 500 MG PO TABS: 500.0000 mg | ORAL_TABLET | Freq: Every day | ORAL | 0 refills | Status: AC

## 2023-10-10 MED ORDER — DM-GUAIFENESIN ER 30-600 MG PO TB12: 1.0000 | ORAL_TABLET | Freq: Two times a day (BID) | ORAL | 0 refills | Status: AC

## 2023-10-10 MED ORDER — ENSURE PLUS HIGH PROTEIN PO LIQD: 237.0000 mL | Freq: Two times a day (BID) | ORAL | Status: DC

## 2023-10-10 MED ORDER — CEFDINIR 300 MG PO CAPS: 300.0000 mg | ORAL_CAPSULE | Freq: Two times a day (BID) | ORAL | 0 refills | Status: AC

## 2023-10-10 MED ORDER — INSULIN ASPART 100 UNIT/ML IJ SOLN: 0.0000 [IU] | Freq: Three times a day (TID) | INTRAMUSCULAR | Status: DC

## 2023-10-10 NOTE — Discharge Summary (Addendum)
 Physician Discharge Summary  SHEMUEL [name] MVH:[id number] DOB: 06/26/1926 DOA: 10/09/2023  PCP: Omie Bickers, MD  Admit date: 10/09/2023  Discharge date: 10/10/2023  Admitted From:ALF  Disposition:  ALF  Recommendations for Outpatient Follow-up:  Follow up with PCP in 1-2 weeks Continue on antibiotics as prescribed to finish course of treatment for suspected acute bronchitis Continue other home medications as prior  Home Health: None  Equipment/Devices: None  Discharge Condition:Stable  CODE STATUS: DNR  Diet recommendation: Heart Healthy  Brief/Interim Summary: [name] is a 89 y.o. male with medical history significant for CKD stage III, HFrEF, chronic biventricular heart failure, hypertension, CAD, paroxysmal atrial fibrillation, BPH, CHB status post Medtronic ICD, dyslipidemia, myelodysplasia, prostate cancer, left adrenal mass, B12 deficiency, and prior tobacco abuse who presented from [facility] due to shortness of breath along with cough over the last week.  He states he has been coughing up some phlegm and denies any sick contacts. He was admitted with concerns for acute hypoxemic respiratory failure secondary to pneumonia, but is thought to have more of an acute bronchitis with no signs of pneumonia noted on chest x-ray.  He has weaned off of 2 L nasal cannula and is otherwise doing quite well and denies any significant symptomatology this morning.  He appears to be in stable condition for discharge and will transition to oral antibiotics as prescribed.  Discharge Diagnoses:  Principal Problem:   Pneumonia Active Problems:   Other hyperlipidemia   Essential hypertension   Coronary artery disease   Automatic implantable cardioverter-defibrillator in situ   Acute respiratory failure with hypoxia (HCC)   Adrenal carcinoma, left (HCC)   DNR (do not resuscitate)   Hyperglycemia   Type 2 diabetes mellitus with vascular disease (HCC)  Principal discharge diagnosis:  Acute bronchitis.  Discharge Instructions  Discharge Instructions     Diet - low sodium heart healthy   Complete by: As directed    Increase activity slowly   Complete by: As directed       Allergies as of 10/10/2023       Reactions   Codeine Nausea And Vomiting        Medication List     TAKE these medications    acetaminophen  500 MG tablet Commonly known as: TYLENOL  Take 1,000 mg by mouth every 8 (eight) hours as needed for mild pain (pain score 1-3).   apixaban  2.5 MG Tabs tablet Commonly known as: Eliquis  Take 1 tablet (2.5 mg total) by mouth 2 (two) times daily.   aspirin  EC 81 MG tablet Take 81 mg by mouth daily. Swallow whole.   azithromycin  500 MG tablet Commonly known as: Zithromax  Take 1 tablet (500 mg total) by mouth daily for 3 days.   carvedilol  12.5 MG tablet Commonly known as: COREG  TAKE 1 TABLET(12.5 MG) BY MOUTH TWICE DAILY WITH A MEAL   cefdinir  300 MG capsule Commonly known as: OMNICEF  Take 1 capsule (300 mg total) by mouth 2 (two) times daily for 4 days.   dextromethorphan -guaiFENesin  30-600 MG 12hr tablet Commonly known as: MUCINEX  DM Take 1 tablet by mouth 2 (two) times daily for 7 days.   docusate sodium  100 MG capsule Commonly known as: COLACE Take 100 mg by mouth 2 (two) times daily.   febuxostat  40 MG tablet Commonly known as: ULORIC  Take 40 mg by mouth daily.   ferrous sulfate  325 (65 FE) MG EC tablet Take 325 mg by mouth 3 (three) times a week. Monday, Wednesday, and Friday  furosemide  40 MG tablet Commonly known as: LASIX  TAKE 1 TABLET(40 MG) BY MOUTH DAILY What changed: See the new instructions.   Lantus  SoloStar 100 UNIT/ML Solostar Pen Generic drug: insulin  glargine Inject 20 Units into the skin daily.   nitroGLYCERIN  0.4 MG SL tablet Commonly known as: NITROSTAT  Place 0.4 mg under the tongue every 5 (five) minutes as needed for chest pain.   potassium chloride  10 MEQ tablet Commonly known as: KLOR-CON  Take  10 mEq by mouth daily.   sacubitril -valsartan  24-26 MG Commonly known as: ENTRESTO  Take 1 tablet by mouth 2 (two) times daily.   simvastatin  40 MG tablet Commonly known as: ZOCOR  TAKE 1 TABLET BY MOUTH EVERY DAY IN THE EVENING   Vitamin Deficiency System-B12 1000 MCG/ML Kit Generic drug: Cyanocobalamin  Inject 1 mL into the skin every 30 (thirty) days. On the 22nd of each month        Follow-up Information     Omie Bickers, MD. Schedule an appointment as soon as possible for a visit in 1 week(s).   Specialty: Internal Medicine Contact information: [address] [place] [address] [phone]                Allergies  Allergen Reactions   Codeine Nausea And Vomiting    Consultations: None   Procedures/Studies: DG Chest Port 1 View Result Date: 10/09/2023 CLINICAL DATA:  Cough, short of breath since yesterday EXAM: PORTABLE CHEST 1 VIEW COMPARISON:  09/06/2023 FINDINGS: Single frontal view of the chest demonstrates stable AICD. Cardiac silhouette is unremarkable. No acute airspace disease, effusion, or pneumothorax. No acute bony abnormalities. IMPRESSION: 1. No acute intrathoracic process. Electronically Signed   By: Bobbye Burrow M.D.   On: 10/09/2023 08:24     Discharge Exam: Vitals:   10/09/23 2203 10/10/23 0516  BP: (!) 124/58 (!) 164/74  Pulse: 62 63  Resp: 16 20  Temp:  98.5 F (36.9 C)  SpO2: 100% 97%   Vitals:   10/09/23 2010 10/09/23 2011 10/09/23 2203 10/10/23 0516  BP: (!) 117/56 (!) 117/56 (!) 124/58 (!) 164/74  Pulse: 62 62 62 63  Resp: 18 18 16 20   Temp: 98.8 F (37.1 C) 98.8 F (37.1 C)  98.5 F (36.9 C)  TempSrc: Oral Oral  Oral  SpO2: 99% 99% 100% 97%  Weight:      Height:        General: Pt is alert, awake, not in acute distress Cardiovascular: RRR, S1/S2 +, no rubs, no gallops Respiratory: CTA bilaterally, no wheezing, no rhonchi Abdominal: Soft, NT, ND, bowel sounds + Extremities: no edema, no  cyanosis    The results of significant diagnostics from this hospitalization (including imaging, microbiology, ancillary and laboratory) are listed below for reference.     Microbiology: Recent Results (from the past 240 hours)  Resp panel by RT-PCR (RSV, Flu A&B, Covid) Anterior Nasal Swab     Status: None   Collection Time: 10/09/23  7:48 AM   Specimen: Anterior Nasal Swab  Result Value Ref Range Status   SARS Coronavirus 2 by RT PCR NEGATIVE NEGATIVE Final    Comment: (NOTE) SARS-CoV-2 target nucleic acids are NOT DETECTED.  The SARS-CoV-2 RNA is generally detectable in upper respiratory specimens during the acute phase of infection. The lowest concentration of SARS-CoV-2 viral copies this assay can detect is 138 copies/mL. A negative result does not preclude SARS-Cov-2 infection and should not be used as the sole basis for treatment or other patient management decisions.  A negative result may occur with  improper specimen collection/handling, submission of specimen other than nasopharyngeal swab, presence of viral mutation(s) within the areas targeted by this assay, and inadequate number of viral copies(<138 copies/mL). A negative result must be combined with clinical observations, patient history, and epidemiological information. The expected result is Negative.  Fact Sheet for Patients:  BloggerCourse.com  Fact Sheet for Healthcare Providers:  SeriousBroker.it  This test is no t yet approved or cleared by the [place]  FDA and  has been authorized for detection and/or diagnosis of SARS-CoV-2 by FDA under an Emergency Use Authorization (EUA). This EUA will remain  in effect (meaning this test can be used) for the duration of the COVID-19 declaration under Section 564(b)(1) of the Act, 21 U.S.C.section 360bbb-3(b)(1), unless the authorization is terminated  or revoked sooner.       Influenza A by PCR NEGATIVE  NEGATIVE Final   Influenza B by PCR NEGATIVE NEGATIVE Final    Comment: (NOTE) The Xpert Xpress SARS-CoV-2/FLU/RSV plus assay is intended as an aid in the diagnosis of influenza from Nasopharyngeal swab specimens and should not be used as a sole basis for treatment. Nasal washings and aspirates are unacceptable for Xpert Xpress SARS-CoV-2/FLU/RSV testing.  Fact Sheet for Patients: BloggerCourse.com  Fact Sheet for Healthcare Providers: SeriousBroker.it  This test is not yet approved or cleared by the [place]  FDA and has been authorized for detection and/or diagnosis of SARS-CoV-2 by FDA under an Emergency Use Authorization (EUA). This EUA will remain in effect (meaning this test can be used) for the duration of the COVID-19 declaration under Section 564(b)(1) of the Act, 21 U.S.C. section 360bbb-3(b)(1), unless the authorization is terminated or revoked.     Resp Syncytial Virus by PCR NEGATIVE NEGATIVE Final    Comment: (NOTE) Fact Sheet for Patients: BloggerCourse.com  Fact Sheet for Healthcare Providers: SeriousBroker.it  This test is not yet approved or cleared by the [place]  FDA and has been authorized for detection and/or diagnosis of SARS-CoV-2 by FDA under an Emergency Use Authorization (EUA). This EUA will remain in effect (meaning this test can be used) for the duration of the COVID-19 declaration under Section 564(b)(1) of the Act, 21 U.S.C. section 360bbb-3(b)(1), unless the authorization is terminated or revoked.  Performed at [facility], [address] [place], [place] [address]   Culture, blood (routine x 2)     Status: None (Preliminary result)   Collection Time: 10/09/23  8:12 AM   Specimen: BLOOD  Result Value Ref Range Status   Specimen Description BLOOD rfa  Final   Special Requests   Final    BOTTLES DRAWN AEROBIC AND ANAEROBIC Blood  Culture adequate volume   Culture   Final    NO GROWTH < 24 HOURS Performed at [facility], [address], [place], [place] [address]    Report Status PENDING  Incomplete  Culture, blood (routine x 2)     Status: None (Preliminary result)   Collection Time: 10/09/23  8:44 AM   Specimen: BLOOD  Result Value Ref Range Status   Specimen Description BLOOD BLOOD RIGHT ARM  Final   Special Requests   Final    Blood Culture adequate volume BOTTLES DRAWN AEROBIC AND ANAEROBIC   Culture   Final    NO GROWTH < 24 HOURS Performed at [facility], [address] [place], [place] [address]    Report Status PENDING  Incomplete     Labs: BNP (last 3 results) Recent Labs  10/09/23 0812  BNP 2,245.0*   Basic Metabolic Panel: Recent Labs  Lab 10/09/23 0812 10/10/23 0432  NA 140 139  K 4.7 4.5  CL 103 103  CO2 24 25  GLUCOSE 56* 112*  BUN 43* 42*  CREATININE 1.59* 1.48*  CALCIUM  8.8* 8.3*  MG  --  2.0   Liver Function Tests: Recent Labs  Lab 10/09/23 0812  AST 22  ALT 14  ALKPHOS 63  BILITOT 0.7  PROT 6.7  ALBUMIN  3.2*   No results for input(s): "LIPASE", "AMYLASE" in the last 168 hours. No results for input(s): "AMMONIA" in the last 168 hours. CBC: Recent Labs  Lab 10/09/23 0812 10/10/23 0432  WBC 5.1 3.7*  NEUTROABS 1.5*  --   HGB 12.0* 10.4*  HCT 38.8* 34.3*  MCV 98.7 98.3  PLT 123* 90*   Cardiac Enzymes: No results for input(s): "CKTOTAL", "CKMB", "CKMBINDEX", "TROPONINI" in the last 168 hours. BNP: Invalid input(s): "POCBNP" CBG: Recent Labs  Lab 10/09/23 1030 10/09/23 2017  GLUCAP 33* 158*   D-Dimer No results for input(s): "DDIMER" in the last 72 hours. Hgb A1c No results for input(s): "HGBA1C" in the last 72 hours. Lipid Profile No results for input(s): "CHOL", "HDL", "LDLCALC", "TRIG", "CHOLHDL", "LDLDIRECT" in the last 72 hours. Thyroid  function studies No results for input(s): "TSH", "T4TOTAL", "T3FREE", "THYROIDAB" in the last 72  hours.  Invalid input(s): "FREET3" Anemia work up No results for input(s): "VITAMINB12", "FOLATE", "FERRITIN", "TIBC", "IRON", "RETICCTPCT" in the last 72 hours. Urinalysis    Component Value Date/Time   COLORURINE YELLOW 10/10/2023 0911   APPEARANCEUR CLEAR 10/10/2023 0911   LABSPEC 1.013 10/10/2023 0911   PHURINE 5.0 10/10/2023 0911   GLUCOSEU NEGATIVE 10/10/2023 0911   HGBUR SMALL (A) 10/10/2023 0911   BILIRUBINUR NEGATIVE 10/10/2023 0911   KETONESUR NEGATIVE 10/10/2023 0911   PROTEINUR 100 (A) 10/10/2023 0911   NITRITE NEGATIVE 10/10/2023 0911   LEUKOCYTESUR NEGATIVE 10/10/2023 0911   Sepsis Labs Recent Labs  Lab 10/09/23 0812 10/10/23 0432  WBC 5.1 3.7*   Microbiology Recent Results (from the past 240 hours)  Resp panel by RT-PCR (RSV, Flu A&B, Covid) Anterior Nasal Swab     Status: None   Collection Time: 10/09/23  7:48 AM   Specimen: Anterior Nasal Swab  Result Value Ref Range Status   SARS Coronavirus 2 by RT PCR NEGATIVE NEGATIVE Final    Comment: (NOTE) SARS-CoV-2 target nucleic acids are NOT DETECTED.  The SARS-CoV-2 RNA is generally detectable in upper respiratory specimens during the acute phase of infection. The lowest concentration of SARS-CoV-2 viral copies this assay can detect is 138 copies/mL. A negative result does not preclude SARS-Cov-2 infection and should not be used as the sole basis for treatment or other patient management decisions. A negative result may occur with  improper specimen collection/handling, submission of specimen other than nasopharyngeal swab, presence of viral mutation(s) within the areas targeted by this assay, and inadequate number of viral copies(<138 copies/mL). A negative result must be combined with clinical observations, patient history, and epidemiological information. The expected result is Negative.  Fact Sheet for Patients:  BloggerCourse.com  Fact Sheet for Healthcare Providers:   SeriousBroker.it  This test is no t yet approved or cleared by the [place]  FDA and  has been authorized for detection and/or diagnosis of SARS-CoV-2 by FDA under an Emergency Use Authorization (EUA). This EUA will remain  in effect (meaning this test can be used) for the duration of the COVID-19 declaration under  Section 564(b)(1) of the Act, 21 U.S.C.section 360bbb-3(b)(1), unless the authorization is terminated  or revoked sooner.       Influenza A by PCR NEGATIVE NEGATIVE Final   Influenza B by PCR NEGATIVE NEGATIVE Final    Comment: (NOTE) The Xpert Xpress SARS-CoV-2/FLU/RSV plus assay is intended as an aid in the diagnosis of influenza from Nasopharyngeal swab specimens and should not be used as a sole basis for treatment. Nasal washings and aspirates are unacceptable for Xpert Xpress SARS-CoV-2/FLU/RSV testing.  Fact Sheet for Patients: BloggerCourse.com  Fact Sheet for Healthcare Providers: SeriousBroker.it  This test is not yet approved or cleared by the [place]  FDA and has been authorized for detection and/or diagnosis of SARS-CoV-2 by FDA under an Emergency Use Authorization (EUA). This EUA will remain in effect (meaning this test can be used) for the duration of the COVID-19 declaration under Section 564(b)(1) of the Act, 21 U.S.C. section 360bbb-3(b)(1), unless the authorization is terminated or revoked.     Resp Syncytial Virus by PCR NEGATIVE NEGATIVE Final    Comment: (NOTE) Fact Sheet for Patients: BloggerCourse.com  Fact Sheet for Healthcare Providers: SeriousBroker.it  This test is not yet approved or cleared by the [place]  FDA and has been authorized for detection and/or diagnosis of SARS-CoV-2 by FDA under an Emergency Use Authorization (EUA). This EUA will remain in effect (meaning this test can be used) for  the duration of the COVID-19 declaration under Section 564(b)(1) of the Act, 21 U.S.C. section 360bbb-3(b)(1), unless the authorization is terminated or revoked.  Performed at [facility], [address], [place], [place] [address]   Culture, blood (routine x 2)     Status: None (Preliminary result)   Collection Time: 10/09/23  8:12 AM   Specimen: BLOOD  Result Value Ref Range Status   Specimen Description BLOOD rfa  Final   Special Requests   Final    BOTTLES DRAWN AEROBIC AND ANAEROBIC Blood Culture adequate volume   Culture   Final    NO GROWTH < 24 HOURS Performed at [facility], [address] [place], [place] [address]    Report Status PENDING  Incomplete  Culture, blood (routine x 2)     Status: None (Preliminary result)   Collection Time: 10/09/23  8:44 AM   Specimen: BLOOD  Result Value Ref Range Status   Specimen Description BLOOD BLOOD RIGHT ARM  Final   Special Requests   Final    Blood Culture adequate volume BOTTLES DRAWN AEROBIC AND ANAEROBIC   Culture   Final    NO GROWTH < 24 HOURS Performed at [facility], [address], [place], [place] [address]    Report Status PENDING  Incomplete     Time coordinating discharge: 35 minutes  SIGNED:   Cornelius Dill, DO [facility] 10/10/2023, 11:42 AM  If 7PM-7AM, please contact night-coverage [email or web address]

## 2023-10-10 NOTE — Evaluation (Signed)
 Physical Therapy Evaluation Patient Details Name: [name] MRN: [id number] DOB: 03-13-1927 Today's Date: 10/10/2023  History of Present Illness  [name] is a 89 y.o. male with medical history significant for CKD stage III, HFrEF, chronic biventricular heart failure, hypertension, CAD, paroxysmal atrial fibrillation, BPH, CHB status post Medtronic ICD, dyslipidemia, myelodysplasia, prostate cancer, left adrenal mass, B12 deficiency, and prior tobacco abuse who presented from [facility] due to shortness of breath along with cough over the last week.  He states he has been coughing up some phlegm and denies any sick contacts.  He has been taking his medications otherwise and has had decent oral intake with no nausea, vomiting, or diarrhea.   Clinical Impression  Patient demonstrates slow labored movement for sitting up at bedside with c/o increased low back pain, once seated c/o mild dizziness that resolved after a few minutes, unsteady on feet requiring use of RW for safety and limited to a few side steps at bedside due to fatigue, generalized weakness and diarrhea. Patient tolerated sitting up on [facility] after therapy with nursing staff present. Patient will benefit from continued skilled physical therapy in hospital and recommended venue below to increase strength, balance, endurance for safe ADLs and gait.           If plan is discharge home, recommend the following: A lot of help with walking and/or transfers;A little help with bathing/dressing/bathroom;Help with stairs or ramp for entrance;Assistance with cooking/housework   Can travel by private vehicle   Yes    Equipment Recommendations None recommended by PT  Recommendations for Other Services       Functional Status Assessment Patient has had a recent decline in their functional status and demonstrates the ability to make significant improvements in function in a reasonable and predictable amount of time.      Precautions / Restrictions Precautions Precautions: Fall Recall of Precautions/Restrictions: Intact Restrictions Weight Bearing Restrictions Per Provider Order: No      Mobility  Bed Mobility Overal bed mobility: Needs Assistance Bed Mobility: Supine to Sit     Supine to sit: Min assist, HOB elevated     General bed mobility comments: required HOB partially rasied due to back pain, increased time, labored movement    Transfers Overall transfer level: Needs assistance Equipment used: Rolling walker (2 wheels) Transfers: Sit to/from Stand, Bed to chair/wheelchair/BSC Sit to Stand: Min assist, Mod assist   Step pivot transfers: Min assist, Mod assist       General transfer comment: unsteady labored movement    Ambulation/Gait Ambulation/Gait assistance: Min assist, Mod assist Gait Distance (Feet): 5 Feet Assistive device: Rolling walker (2 wheels) Gait Pattern/deviations: Decreased step length - right, Decreased step length - left, Trunk flexed Gait velocity: slow     General Gait Details: limited to a few side steps at bedside mostly due to c/o fatigue, low back pain and having episode of diarreha  Stairs            Wheelchair Mobility     Tilt Bed    Modified Rankin (Stroke Patients Only)       Balance Overall balance assessment: Needs assistance Sitting-balance support: Feet supported, No upper extremity supported Sitting balance-Leahy Scale: Fair Sitting balance - Comments: fair/good seated at EOB   Standing balance support: During functional activity, Bilateral upper extremity supported, Reliant on assistive device for balance Standing balance-Leahy Scale: Poor Standing balance comment: fair/poor using RW  Pertinent Vitals/Pain Pain Assessment Pain Assessment: Faces Faces Pain Scale: Hurts even more Pain Location: low back Pain Descriptors / Indicators: Aching, Grimacing, Sore, Discomfort Pain  Intervention(s): Limited activity within patient's tolerance, Monitored during session, Repositioned    Home Living Family/patient expects to be discharged to:: Assisted living                 Home Equipment: Agricultural consultant (2 wheels);Cane - single point;Wheelchair - manual;Shower seat      Prior Function Prior Level of Function : Needs assist       Physical Assist : Mobility (physical);ADLs (physical) Mobility (physical): Bed mobility;Transfers;Gait;Stairs   Mobility Comments: household ambulation without AD, "per patient" ADLs Comments: Assisted by ALF staff     Extremity/Trunk Assessment   Upper Extremity Assessment Upper Extremity Assessment: Generalized weakness    Lower Extremity Assessment Lower Extremity Assessment: Generalized weakness    Cervical / Trunk Assessment Cervical / Trunk Assessment: Kyphotic  Communication   Communication Factors Affecting Communication: Hearing impaired    Cognition Arousal: Alert Behavior During Therapy: WFL for tasks assessed/performed   PT - Cognitive impairments: No apparent impairments                         Following commands: Intact       Cueing Cueing Techniques: Verbal cues, Tactile cues     General Comments      Exercises     Assessment/Plan    PT Assessment Patient needs continued PT services  PT Problem List Decreased strength;Decreased activity tolerance;Decreased balance;Decreased mobility       PT Treatment Interventions DME instruction;Gait training;Stair training;Functional mobility training;Therapeutic activities;Therapeutic exercise;Balance training;Patient/family education    PT Goals (Current goals can be found in the Care Plan section)  Acute Rehab PT Goals Patient Stated Goal: return to ALF after rehab PT Goal Formulation: With patient Time For Goal Achievement: 10/24/23 Potential to Achieve Goals: Good    Frequency Min 3X/week     Co-evaluation                AM-PAC PT "6 Clicks" Mobility  Outcome Measure Help needed turning from your back to your side while in a flat bed without using bedrails?: A Little Help needed moving from lying on your back to sitting on the side of a flat bed without using bedrails?: A Little Help needed moving to and from a bed to a chair (including a wheelchair)?: A Lot Help needed standing up from a chair using your arms (e.g., wheelchair or bedside chair)?: A Little Help needed to walk in hospital room?: A Lot Help needed climbing 3-5 steps with a railing? : A Lot 6 Click Score: 15    End of Session   Activity Tolerance: Patient tolerated treatment well;Patient limited by fatigue Patient left: with call bell/phone within reach;with nursing/sitter in room;Other (comment) (Patient left seated on [facility]) Nurse Communication: Mobility status PT Visit Diagnosis: Unsteadiness on feet (R26.81);Other abnormalities of gait and mobility (R26.89);Muscle weakness (generalized) (M62.81)    Time: 1191-4782 PT Time Calculation (min) (ACUTE ONLY): 28 min   Charges:   PT Evaluation $PT Eval Moderate Complexity: 1 Mod PT Treatments $Therapeutic Activity: 23-37 mins PT General Charges $$ ACUTE PT VISIT: 1 Visit         12:26 PM, 10/10/23 Walton Guppy, MPT Physical Therapist with [facility] 336 [phone] office [phone] mobile phone

## 2023-10-10 NOTE — [place] FL2 (Signed)
 [place]  MEDICAID FL2 LEVEL OF CARE FORM     IDENTIFICATION  Patient Name: [name] Birthdate: Sep 25, 1926 Sex: male Admission Date (Current Location): 10/09/2023  [facility] and [place] Number:  [employer] and Address:  [facility],  [address] [place] [address]      Provider Number: [id number]  Attending Physician Name and Address:  Cornelius Dill, DO  Relative Name and Phone Number:  Darryon Bastin [phone] (son)    Current Level of Care: Hospital Recommended Level of Care: Assisted Living Facility Prior Approval Number:    Date Approved/Denied:   PASRR Number: [id number]  Discharge Plan: SNF    Current Diagnoses: Patient Active Problem List   Diagnosis Date Noted   Pneumonia 10/09/2023   Pneumonia due to COVID-19 virus 05/22/2022   DNR (do not resuscitate) 05/22/2022   Hyperglycemia 05/22/2022   Type 2 diabetes mellitus with vascular disease (HCC) 05/22/2022   Adrenal carcinoma, left (HCC) 12/14/2021   Left adrenal mass (HCC) 11/27/2021   Pressure injury of skin 04/20/2021   Bradycardia 04/19/2021   Heart block AV complete (HCC) 04/18/2021   Acute combined systolic and diastolic congestive heart failure (HCC)    Sepsis due to undetermined organism (HCC) 04/13/2021   Lobar pneumonia (HCC) 04/13/2021   Acute respiratory failure with hypoxia and hypercarbia (HCC) 04/13/2021   Acute on chronic congestive heart failure (HCC) 04/12/2021   Acute respiratory failure with hypoxia (HCC) 04/12/2021   Unstable angina (HCC)    Family history of CABG    Chest pain 12/16/2020   Acute renal failure superimposed on stage 3 chronic kidney disease, unspecified acute renal failure type, unspecified whether stage 3a or 3b CKD (HCC) 11/07/2020   AKI (acute kidney injury) (HCC) 11/07/2020   Acute renal failure (ARF) (HCC) 11/07/2020   Gout involving toe 05/26/2019   Vitamin B 12 deficiency 07/27/2016   Myelodysplasia, low grade (HCC) 11/27/2015    Other primary cardiomyopathies 08/07/2011   Carotid stenosis 07/26/2011   Automatic implantable cardioverter-defibrillator in situ 07/26/2009   CAD, AUTOLOGOUS BYPASS GRAFT 08/24/2008   PROSTATE CANCER 08/03/2008   Other hyperlipidemia 08/03/2008   Essential hypertension 08/03/2008   MYOCARDIAL INFARCTION, HX OF 08/03/2008   Coronary artery disease 08/03/2008   Ischemic cardiomyopathy 08/03/2008   SYSTOLIC HEART FAILURE, CHRONIC 08/03/2008   NEPHROLITHIASIS, HX OF 08/03/2008   POSTSURGICAL AORTOCORONARY BYPASS STATUS 08/03/2008    Orientation RESPIRATION BLADDER Height & Weight     Self, Situation, Place  O2, Other (Comment) (Currently on RA; 4 lpm via nasal cannula on adm.) External catheter Weight: 72.1 kg Height:  5\' 6"  (167.6 cm)  BEHAVIORAL SYMPTOMS/MOOD NEUROLOGICAL BOWEL NUTRITION STATUS      Continent Diet (see discharge summary)  AMBULATORY STATUS COMMUNICATION OF NEEDS Skin   Limited Assist Verbally Bruising, Other (Comment) (Bruising to arms and hands; Redness to sacrum.)                       Personal Care Assistance Level of Assistance  Bathing, Dressing Bathing Assistance: Limited assistance   Dressing Assistance: Limited assistance     Functional Limitations Info  Sight, Hearing, Speech Sight Info: Adequate Hearing Info: Adequate Speech Info: Adequate    SPECIAL CARE FACTORS FREQUENCY                       Contractures Contractures Info: Not present    Additional Factors Info  Code Status, Allergies Code Status Info:  Limited DNR - no CPR, no intubation. Allergies Info: Codeine           Current Medications (10/10/2023):  This is the current hospital active medication list Current Facility-Administered Medications  Medication Dose Route Frequency Provider Last Rate Last Admin   acetaminophen  (TYLENOL ) tablet 650 mg  650 mg Oral Q6H PRN Shah, Pratik D, DO   650 mg at 10/10/23 0454   Or   acetaminophen  (TYLENOL ) suppository 650 mg   650 mg Rectal Q6H PRN Mason Sole, Pratik D, DO       apixaban  (ELIQUIS ) tablet 2.5 mg  2.5 mg Oral BID Mason Sole, Pratik D, DO   2.5 mg at 10/10/23 0981   azithromycin  (ZITHROMAX ) 500 mg in sodium chloride  0.9 % 250 mL IVPB  500 mg Intravenous Q24H Shah, Pratik D, DO 250 mL/hr at 10/10/23 0839 500 mg at 10/10/23 0839   carvedilol  (COREG ) tablet 12.5 mg  12.5 mg Oral BID WC Mason Sole, Pratik D, DO   12.5 mg at 10/10/23 1914   cefTRIAXone  (ROCEPHIN ) 2 g in sodium chloride  0.9 % 100 mL IVPB  2 g Intravenous Q24H Mason Sole, Pratik D, DO 200 mL/hr at 10/10/23 1024 2 g at 10/10/23 1024   colchicine tablet 0.6 mg  0.6 mg Oral BID Shah, Pratik D, DO   0.6 mg at 10/10/23 7829   dextromethorphan -guaiFENesin  (MUCINEX  DM) 30-600 MG per 12 hr tablet 1 tablet  1 tablet Oral BID Shah, Pratik D, DO   1 tablet at 10/10/23 5621   docusate sodium  (COLACE) capsule 100 mg  100 mg Oral BID Shah, Pratik D, DO   100 mg at 10/10/23 3086   febuxostat (ULORIC) tablet 40 mg  40 mg Oral Daily Shah, Pratik D, DO   40 mg at 10/09/23 1701   feeding supplement (ENSURE PLUS HIGH PROTEIN) liquid 237 mL  237 mL Oral BID BM Mason Sole, Pratik D, DO       ferrous sulfate tablet 325 mg  325 mg Oral Once per day on Monday Wednesday Friday Doreene Gammon D, DO   325 mg at 10/09/23 1703   furosemide  (LASIX ) tablet 40 mg  40 mg Oral QODAY Shah, Pratik D, DO   40 mg at 10/09/23 1701   insulin  aspart (novoLOG ) injection 0-5 Units  0-5 Units Subcutaneous QHS Mason Sole, Pratik D, DO       insulin  aspart (novoLOG ) injection 0-6 Units  0-6 Units Subcutaneous TID WC Shah, Pratik D, DO       ipratropium-albuterol  (DUONEB) 0.5-2.5 (3) MG/3ML nebulizer solution 3 mL  3 mL Nebulization Q6H PRN Mason Sole, Pratik D, DO       nitroGLYCERIN  (NITROSTAT ) SL tablet 0.4 mg  0.4 mg Sublingual Q5 min PRN Mason Sole, Pratik D, DO       ondansetron  (ZOFRAN ) tablet 4 mg  4 mg Oral Q6H PRN Mason Sole, Pratik D, DO       Or   ondansetron  (ZOFRAN ) injection 4 mg  4 mg Intravenous Q6H PRN Mason Sole, Pratik D, DO        potassium chloride  (KLOR-CON  M) CR tablet 10 mEq  10 mEq Oral QODAY Shah, Pratik D, DO   10 mEq at 10/10/23 5784   sacubitril -valsartan  (ENTRESTO ) 24-26 mg per tablet  1 tablet Oral BID Shah, Pratik D, DO   1 tablet at 10/10/23 6962   simvastatin  (ZOCOR ) tablet 40 mg  40 mg Oral q1800 Shah, Pratik D, DO   40 mg at 10/09/23 1753     Discharge Medications: Allergies as  of 10/10/2023       Reactions   Codeine Nausea And Vomiting        Medication List     TAKE these medications    acetaminophen  500 MG tablet Commonly known as: TYLENOL  Take 1,000 mg by mouth every 8 (eight) hours as needed for mild pain (pain score 1-3).   apixaban  2.5 MG Tabs tablet Commonly known as: Eliquis  Take 1 tablet (2.5 mg total) by mouth 2 (two) times daily.   aspirin  EC 81 MG tablet Take 81 mg by mouth daily. Swallow whole.   azithromycin  500 MG tablet Commonly known as: Zithromax  Take 1 tablet (500 mg total) by mouth daily for 3 days.   carvedilol  12.5 MG tablet Commonly known as: COREG  TAKE 1 TABLET(12.5 MG) BY MOUTH TWICE DAILY WITH A MEAL   cefdinir  300 MG capsule Commonly known as: OMNICEF  Take 1 capsule (300 mg total) by mouth 2 (two) times daily for 4 days.   dextromethorphan -guaiFENesin  30-600 MG 12hr tablet Commonly known as: MUCINEX  DM Take 1 tablet by mouth 2 (two) times daily for 7 days.   docusate sodium  100 MG capsule Commonly known as: COLACE Take 100 mg by mouth 2 (two) times daily.   febuxostat  40 MG tablet Commonly known as: ULORIC  Take 40 mg by mouth daily.   ferrous sulfate  325 (65 FE) MG EC tablet Take 325 mg by mouth 3 (three) times a week. Monday, Wednesday, and Friday   furosemide  40 MG tablet Commonly known as: LASIX  TAKE 1 TABLET(40 MG) BY MOUTH DAILY What changed: See the new instructions.   Lantus  SoloStar 100 UNIT/ML Solostar Pen Generic drug: insulin  glargine Inject 20 Units into the skin daily.   nitroGLYCERIN  0.4 MG SL tablet Commonly known as:  NITROSTAT  Place 0.4 mg under the tongue every 5 (five) minutes as needed for chest pain.   potassium chloride  10 MEQ tablet Commonly known as: KLOR-CON  Take 10 mEq by mouth daily.   sacubitril -valsartan  24-26 MG Commonly known as: ENTRESTO  Take 1 tablet by mouth 2 (two) times daily.   simvastatin  40 MG tablet Commonly known as: ZOCOR  TAKE 1 TABLET BY MOUTH EVERY DAY IN THE EVENING   Vitamin Deficiency System-B12 1000 MCG/ML Kit Generic drug: Cyanocobalamin  Inject 1 mL into the skin every 30 (thirty) days. On the 22nd of each month         Relevant Imaging Results:  Relevant Lab Results:   Additional Information SSN: [id number]  Geraldina Klinefelter, RN

## 2023-10-10 NOTE — TOC Transition Note (Signed)
 Transition of Care [facility]) - Discharge Note   Patient Details  Name: [name] MRN: [id number] Date of Birth: 11-Jun-1926  [facility] CM/SW Contact:  Geraldina Klinefelter, RN Phone Number: 10/10/2023, 2:39 PM   Clinical Narrative:    Pt will return to [facility]F. FL2 and dc summary sent via fax. Notified Paula at facility of PT recommendation of min to mod assist c/transfers. Maureen Sour stated that is pt's baseline. [facility] is unable to transport and pt's son is also unavailable. [facility] accepted transport and is scheduled to pick up at 3:30. TOC signing off.    Barriers to Discharge: Continued Medical Work up   Patient Goals and CMS Choice Patient states their goals for this hospitalization and ongoing recovery are:: Ease of breath. CMS Medicare.gov Compare Post Acute Care list provided to:: Patient Choice offered to / list presented to : Patient [place] ownership interest in [facility].provided to:: Patient    Discharge Placement                       Discharge Plan and Services Additional resources added to the After Visit Summary for   In-house Referral: Clinical Social Work Discharge Planning Services: CM Consult                                 Social Drivers of Health (SDOH) Interventions SDOH Screenings   Food Insecurity: No Food Insecurity (10/09/2023)  Housing: Low Risk  (10/09/2023)  Transportation Needs: No Transportation Needs (10/09/2023)  Utilities: Not At Risk (10/09/2023)  Depression (PHQ2-9): Low Risk  (05/26/2019)  Social Connections: Unknown (10/09/2023)  Tobacco Use: Medium Risk (10/09/2023)     Readmission Risk Interventions    04/25/2021    1:30 PM 04/24/2021    4:26 PM 04/17/2021    3:57 PM  Readmission Risk Prevention Plan  Transportation Screening Complete Complete Complete  Medication Review [employer])   Complete  PCP or Specialist appointment within 3-5 days of discharge    Complete  HRI or Home Care Consult Complete Complete Complete  SW Recovery Care/Counseling Consult Complete Complete Complete  Palliative Care Screening Complete  Not Applicable  Skilled Nursing Facility Complete Complete Complete

## 2023-10-10 NOTE — Plan of Care (Signed)
  Problem: Clinical Measurements: Goal: Respiratory complications will improve Outcome: Progressing   Problem: Coping: Goal: Level of anxiety will decrease Outcome: Progressing   Problem: Pain Managment: Goal: General experience of comfort will improve and/or be controlled Outcome: Progressing   Problem: Safety: Goal: Ability to remain free from injury will improve Outcome: Progressing

## 2023-10-10 NOTE — Inpatient Diabetes Management (Signed)
 Inpatient Diabetes Program Recommendations  AACE/ADA: New Consensus Statement on Inpatient Glycemic Control   Target Ranges:  Prepandial:   less than 140 mg/dL      Peak postprandial:   less than 180 mg/dL (1-2 hours)      Critically ill patients:  140 - 180 mg/dL    Latest Reference Range & Units 10/09/23 10:30 10/09/23 20:17  Glucose-Capillary 70 - 99 mg/dL 33 (LL) 109 (H)    Latest Reference Range & Units 10/09/23 08:12 10/10/23 04:32  Glucose 70 - 99 mg/dL 56 (L) 604 (H)   Review of Glycemic Control  Diabetes history: DM2 Outpatient Diabetes medications: Lantus  20 units daily Current orders for Inpatient glycemic control: None  Inpatient Diabetes Program Recommendations:    Insulin : Please consider ordering CBGs AC&HS and Novolog  0-6 units TID with meals and Novolog  0-5 units at bedtime.  Thanks, Beacher Limerick, RN, MSN, CDCES Diabetes Coordinator Inpatient Diabetes Program [phone] (Team Pager from 8am to 5pm)

## 2023-10-10 NOTE — Plan of Care (Signed)
  Problem: Acute Rehab PT Goals(only PT should resolve) Goal: Pt Will Go Supine/Side To Sit Outcome: Progressing Flowsheets (Taken 10/10/2023 1233) Pt will go Supine/Side to Sit:  with contact guard assist  with minimal assist Goal: Patient Will Transfer Sit To/From Stand Outcome: Progressing Flowsheets (Taken 10/10/2023 1233) Patient will transfer sit to/from stand:  with contact guard assist  with minimal assist Goal: Pt Will Transfer Bed To Chair/Chair To Bed Outcome: Progressing Flowsheets (Taken 10/10/2023 1233) Pt will Transfer Bed to Chair/Chair to Bed:  with contact guard assist  with min assist Goal: Pt Will Ambulate Outcome: Progressing Flowsheets (Taken 10/10/2023 1233) Pt will Ambulate:  25 feet  with minimal assist  with rolling walker   12:34 PM, 10/10/23 Walton Guppy, MPT Physical Therapist with [facility] [phone] office [phone] mobile phone

## 2023-10-14 LAB — CULTURE, BLOOD (ROUTINE X 2)
Culture: NO GROWTH
Culture: NO GROWTH
Special Requests: ADEQUATE
Special Requests: ADEQUATE

## 2023-10-15 DIAGNOSIS — Z87891 Personal history of nicotine dependence: Secondary | ICD-10-CM | POA: Diagnosis not present

## 2023-10-15 DIAGNOSIS — J189 Pneumonia, unspecified organism: Secondary | ICD-10-CM | POA: Diagnosis not present

## 2023-10-15 DIAGNOSIS — R051 Acute cough: Secondary | ICD-10-CM | POA: Diagnosis not present

## 2023-10-20 ENCOUNTER — Other Ambulatory Visit: Payer: Self-pay

## 2023-10-20 ENCOUNTER — Emergency Department (HOSPITAL_COMMUNITY)

## 2023-10-20 ENCOUNTER — Encounter (HOSPITAL_COMMUNITY): Payer: Self-pay | Admitting: Emergency Medicine

## 2023-10-20 ENCOUNTER — Emergency Department (HOSPITAL_COMMUNITY)
Admission: EM | Admit: 2023-10-20 | Discharge: 2023-10-20 | Attending: Emergency Medicine | Admitting: Emergency Medicine

## 2023-10-20 DIAGNOSIS — E162 Hypoglycemia, unspecified: Secondary | ICD-10-CM | POA: Diagnosis not present

## 2023-10-20 DIAGNOSIS — Z043 Encounter for examination and observation following other accident: Secondary | ICD-10-CM | POA: Diagnosis not present

## 2023-10-20 DIAGNOSIS — Z7901 Long term (current) use of anticoagulants: Secondary | ICD-10-CM | POA: Insufficient documentation

## 2023-10-20 DIAGNOSIS — M8448XA Pathological fracture, other site, initial encounter for fracture: Secondary | ICD-10-CM | POA: Diagnosis not present

## 2023-10-20 DIAGNOSIS — I129 Hypertensive chronic kidney disease with stage 1 through stage 4 chronic kidney disease, or unspecified chronic kidney disease: Secondary | ICD-10-CM | POA: Diagnosis not present

## 2023-10-20 DIAGNOSIS — W19XXXA Unspecified fall, initial encounter: Secondary | ICD-10-CM

## 2023-10-20 DIAGNOSIS — I6782 Cerebral ischemia: Secondary | ICD-10-CM | POA: Diagnosis not present

## 2023-10-20 DIAGNOSIS — Z7982 Long term (current) use of aspirin: Secondary | ICD-10-CM | POA: Insufficient documentation

## 2023-10-20 DIAGNOSIS — N1832 Chronic kidney disease, stage 3b: Secondary | ICD-10-CM | POA: Diagnosis not present

## 2023-10-20 DIAGNOSIS — S0990XA Unspecified injury of head, initial encounter: Secondary | ICD-10-CM | POA: Diagnosis not present

## 2023-10-20 DIAGNOSIS — R918 Other nonspecific abnormal finding of lung field: Secondary | ICD-10-CM | POA: Diagnosis not present

## 2023-10-20 DIAGNOSIS — N189 Chronic kidney disease, unspecified: Secondary | ICD-10-CM | POA: Diagnosis not present

## 2023-10-20 DIAGNOSIS — E11649 Type 2 diabetes mellitus with hypoglycemia without coma: Secondary | ICD-10-CM | POA: Diagnosis not present

## 2023-10-20 DIAGNOSIS — R0602 Shortness of breath: Secondary | ICD-10-CM | POA: Diagnosis not present

## 2023-10-20 DIAGNOSIS — E161 Other hypoglycemia: Secondary | ICD-10-CM | POA: Diagnosis not present

## 2023-10-20 DIAGNOSIS — R0989 Other specified symptoms and signs involving the circulatory and respiratory systems: Secondary | ICD-10-CM | POA: Diagnosis not present

## 2023-10-20 DIAGNOSIS — R231 Pallor: Secondary | ICD-10-CM | POA: Diagnosis not present

## 2023-10-20 LAB — BASIC METABOLIC PANEL WITH GFR
Anion gap: 9 (ref 5–15)
BUN: 32 mg/dL — ABNORMAL HIGH (ref 8–23)
CO2: 24 mmol/L (ref 22–32)
Calcium: 8.6 mg/dL — ABNORMAL LOW (ref 8.9–10.3)
Chloride: 106 mmol/L (ref 98–111)
Creatinine, Ser: 1.65 mg/dL — ABNORMAL HIGH (ref 0.61–1.24)
GFR, Estimated: 38 mL/min — ABNORMAL LOW (ref >60)
Glucose, Bld: 108 mg/dL — ABNORMAL HIGH (ref 70–99)
Potassium: 4.2 mmol/L (ref 3.5–5.1)
Sodium: 139 mmol/L (ref 135–145)

## 2023-10-20 LAB — CBC
HCT: 46 % (ref 39.0–52.0)
Hemoglobin: 14.6 g/dL (ref 13.0–17.0)
MCH: 31.3 pg (ref 26.0–34.0)
MCHC: 31.7 g/dL (ref 30.0–36.0)
MCV: 98.7 fL (ref 80.0–100.0)
Platelets: 145 10*3/uL — ABNORMAL LOW (ref 150–400)
RBC: 4.66 MIL/uL (ref 4.22–5.81)
RDW: 15.2 % (ref 11.5–15.5)
WBC: 4 10*3/uL (ref 4.0–10.5)
nRBC: 0 % (ref 0.0–0.2)

## 2023-10-20 LAB — CBG MONITORING, ED: Glucose-Capillary: 122 mg/dL — ABNORMAL HIGH (ref 70–99)

## 2023-10-20 NOTE — Discharge Instructions (Signed)
 It was our pleasure to provide your ER care today - we hope that you feel better.  If you begin to feel your blood sugar is low, eat or drink something right away and check blood sugar.   Fall precautions.   Follow up closely with primary care doctor in the coming week.   Return to ER if worse, new symptoms, fevers, new/severe pain, trouble breathing, or other concern.

## 2023-10-20 NOTE — ED Triage Notes (Signed)
 Pt bib rcems after a fall and facility reports of sob. EMS also discovered a cbg of 54. Facility reports he was also found on the floor last night. Pt is complaining of all over pain. Facility reports recent malaise and URI. EMS administered of D10. Pt is on a blood thinner

## 2023-10-20 NOTE — ED Provider Notes (Signed)
 [place] EMERGENCY DEPARTMENT AT [facility] Provider Note   CSN: [id number] Arrival date & time: 10/20/23  0830     History  Chief Complaint  Patient presents with   [name] is a 89 y.o. male.  Pt with c/o fall at SNF, pt reportedly found on floor last night. Is on eliquis , hx afib. Pt limited historian, ?dementia/memory impairment - level 5 caveat. Denies specific pain or injury, does say he would like something to drink. EMS was called and noted CBG 54, and gave D10. Pt denies headache. No neck/back pain. No chest pain or sob. No abd pain or nv. No gu c/o. No focal extremity pain or injury.   The history is provided by the patient, medical records, the nursing home and the EMS personnel. The history is limited by the condition of the patient.  Fall Pertinent negatives include no chest pain, no abdominal pain, no headaches and no shortness of breath.  Shortness of Breath Associated symptoms: no abdominal pain, no chest pain, no fever, no headaches, no neck pain, no sore throat and no vomiting        Home Medications Prior to Admission medications   Medication Sig Start Date End Date Taking? Authorizing Provider  acetaminophen  (TYLENOL ) 500 MG tablet Take 1,000 mg by mouth every 8 (eight) hours as needed for mild pain (pain score 1-3).    [provider]  apixaban  (ELIQUIS ) 2.5 MG TABS tablet Take 1 tablet (2.5 mg total) by mouth 2 (two) times daily. 09/04/22   Tammie Fall, MD  aspirin  EC 81 MG tablet Take 81 mg by mouth daily. Swallow whole.    [provider]  carvedilol  (COREG ) 12.5 MG tablet TAKE 1 TABLET(12.5 MG) BY MOUTH TWICE DAILY WITH A MEAL 04/09/22   Gerard Knight, MD  Cyanocobalamin  (VITAMIN DEFICIENCY SYSTEM-B12) 1000 MCG/ML KIT Inject 1 mL into the skin every 30 (thirty) days. On the 22nd of each month    [provider]  docusate sodium  (COLACE) 100 MG capsule Take 100 mg by mouth 2 (two) times daily.     [provider]  febuxostat  (ULORIC ) 40 MG tablet Take 40 mg by mouth daily. 08/30/23   [provider]  ferrous sulfate  325 (65 FE) MG EC tablet Take 325 mg by mouth 3 (three) times a week. Monday, Wednesday, and Friday    [provider]  furosemide  (LASIX ) 40 MG tablet TAKE 1 TABLET(40 MG) BY MOUTH DAILY Patient taking differently: Take 40 mg by mouth every other day. 07/28/21   Gerard Knight, MD  LANTUS  SOLOSTAR 100 UNIT/ML Solostar Pen Inject 20 Units into the skin daily. 06/19/23   [provider]  nitroGLYCERIN  (NITROSTAT ) 0.4 MG SL tablet Place 0.4 mg under the tongue every 5 (five) minutes as needed for chest pain.    [provider]  potassium chloride  (KLOR-CON ) 10 MEQ tablet Take 10 mEq by mouth daily. 09/26/23   [provider]  sacubitril -valsartan  (ENTRESTO ) 24-26 MG Take 1 tablet by mouth 2 (two) times daily. 07/04/22   Gerard Knight, MD  simvastatin  (ZOCOR ) 40 MG tablet TAKE 1 TABLET BY MOUTH EVERY DAY IN THE EVENING 05/22/22   Gerard Knight, MD      Allergies    Codeine    Review of Systems   Review of Systems  Constitutional:  Negative for fever.  HENT:  Negative for sore throat.   Respiratory:  Negative for shortness  of breath.   Cardiovascular:  Negative for chest pain.  Gastrointestinal:  Negative for abdominal pain and vomiting.  Genitourinary:  Negative for dysuria and flank pain.  Musculoskeletal:  Negative for back pain and neck pain.  Skin:  Negative for wound.  Neurological:  Negative for speech difficulty, weakness, numbness and headaches.    Physical Exam Updated Vital Signs BP (!) 135/59   Pulse (!) 57   Temp 98.2 F (36.8 C) (Oral)   Resp 15   Ht 1.676 m (5\' 6" )   Wt 72 kg   SpO2 96%   BMI 25.62 kg/m  Physical Exam Vitals and nursing note reviewed.  Constitutional:      Appearance: Normal appearance. He is well-developed.  HENT:     Head: Atraumatic.     Nose: Nose normal.      Mouth/Throat:     Mouth: Mucous membranes are moist.     Pharynx: Oropharynx is clear.  Eyes:     General: No scleral icterus.    Conjunctiva/sclera: Conjunctivae normal.     Pupils: Pupils are equal, round, and reactive to light.  Neck:     Vascular: No carotid bruit.     Trachea: No tracheal deviation.  Cardiovascular:     Rate and Rhythm: Normal rate and regular rhythm.     Pulses: Normal pulses.     Heart sounds: Normal heart sounds. No murmur heard.    No friction rub. No gallop.  Pulmonary:     Effort: Pulmonary effort is normal. No accessory muscle usage or respiratory distress.     Breath sounds: Normal breath sounds.  Chest:     Chest wall: No tenderness.  Abdominal:     General: Bowel sounds are normal. There is no distension.     Palpations: Abdomen is soft.     Tenderness: There is no abdominal tenderness. There is no guarding.  Genitourinary:    Comments: No cva tenderness. Musculoskeletal:        General: No swelling.     Cervical back: Normal range of motion and neck supple. No rigidity or tenderness.     Comments: CTLS spine, non tender, aligned, no step off. Good passive rom bil extremities without pain or focal bony tenderness noted. No shortening, rotation, gross deformity, or focal pain/tenderness noted.   Skin:    General: Skin is warm and dry.     Findings: No rash.  Neurological:     Mental Status: He is alert.     Comments: Alert, speech clear. GCS 15. Motor/sens grossly intact bil.   Psychiatric:        Mood and Affect: Mood normal.     ED Results / Procedures / Treatments   Labs (all labs ordered are listed, but only abnormal results are displayed) Results for orders placed or performed during the hospital encounter of 10/20/23  CBG monitoring, ED   Collection Time: 10/20/23  8:41 AM  Result Value Ref Range   Glucose-Capillary 122 (H) 70 - 99 mg/dL  CBC   Collection Time: 10/20/23  9:30 AM  Result Value Ref Range   WBC 4.0 4.0 - 10.5 K/uL    RBC 4.66 4.22 - 5.81 MIL/uL   Hemoglobin 14.6 13.0 - 17.0 g/dL   HCT 56.2 13.0 - 86.5 %   MCV 98.7 80.0 - 100.0 fL   MCH 31.3 26.0 - 34.0 pg   MCHC 31.7 30.0 - 36.0 g/dL   RDW 78.4 69.6 - 29.5 %   Platelets  145 (L) 150 - 400 K/uL   nRBC 0.0 0.0 - 0.2 %  Basic metabolic panel with GFR   Collection Time: 10/20/23  9:30 AM  Result Value Ref Range   Sodium 139 135 - 145 mmol/L   Potassium 4.2 3.5 - 5.1 mmol/L   Chloride 106 98 - 111 mmol/L   CO2 24 22 - 32 mmol/L   Glucose, Bld 108 (H) 70 - 99 mg/dL   BUN 32 (H) 8 - 23 mg/dL   Creatinine, Ser 3.66 (H) 0.61 - 1.24 mg/dL   Calcium  8.6 (L) 8.9 - 10.3 mg/dL   GFR, Estimated 38 (L) >60 mL/min   Anion gap 9 5 - 15   DG Chest 2 View Result Date: 10/20/2023 CLINICAL DATA:  Status post fall with shortness of breath. EXAM: CHEST - 2 VIEW COMPARISON:  Oct 09, 2023 FINDINGS: There is stable multi lead AICD positioning. Multiple sternal wires and vascular clips are in place. The cardiac silhouette is mildly enlarged and unchanged in size. Low lung volumes are noted with mild linear scarring and/or atelectasis is seen within the mid left lung. No pleural effusion or pneumothorax is identified. A chronic sixth right rib fracture is seen with multilevel degenerative changes noted throughout the thoracic spine. IMPRESSION: 1. Evidence of prior median sternotomy/CABG. 2. Low lung volumes with mild linear scarring and/or atelectasis within the mid left lung. Electronically Signed   By: Virgle Grime M.D.   On: 10/20/2023 10:07   CT Head Wo Contrast Result Date: 10/20/2023 CLINICAL DATA:  Head trauma, minor (Age >= 65y) EXAM: CT HEAD WITHOUT CONTRAST TECHNIQUE: Contiguous axial images were obtained from the base of the skull through the vertex without intravenous contrast. RADIATION DOSE REDUCTION: This exam was performed according to the departmental dose-optimization program which includes automated exposure control, adjustment of the mA and/or kV  according to patient size and/or use of iterative reconstruction technique. COMPARISON:  06/22/2023. FINDINGS: Brain: There is periventricular white matter decreased attenuation consistent with small vessel ischemic changes. Ventricles, sulci and cisterns are prominent consistent with age related involutional changes. No acute intracranial hemorrhage, mass effect or shift. No hydrocephalus. Vascular: No hyperdense vessel or unexpected calcification. Skull: Normal. Negative for fracture or focal lesion. Sinuses/Orbits: Mucoperiosteal thickening consistent with chronic maxillary an ethmoid and frontal sinusitis. Left maxillary antrum nearly completely opacified. Air-fluid level in the sphenoid consistent with acute sinusitis. IMPRESSION: 1. Atrophy and chronic small vessel ischemic changes. No acute intracranial process identified. 2. Acute on chronic sinusitis. Electronically Signed   By: Sydell Eva M.D.   On: 10/20/2023 10:07   DG Chest Port 1 View Result Date: 10/09/2023 CLINICAL DATA:  Cough, short of breath since yesterday EXAM: PORTABLE CHEST 1 VIEW COMPARISON:  09/06/2023 FINDINGS: Single frontal view of the chest demonstrates stable AICD. Cardiac silhouette is unremarkable. No acute airspace disease, effusion, or pneumothorax. No acute bony abnormalities. IMPRESSION: 1. No acute intrathoracic process. Electronically Signed   By: Bobbye Burrow M.D.   On: 10/09/2023 08:24     EKG EKG Interpretation Date/Time:  Sunday October 20 2023 08:37:51 EDT Ventricular Rate:  69 PR Interval:    QRS Duration:  157 QT Interval:  459 QTC Calculation: 492 R Axis:   -78  Text Interpretation: AV sequential or dual chamber electronic pacemaker No significant change since last tracing Confirmed by Guadalupe Lee ([id number]) on 10/20/2023 8:53:12 AM  Radiology DG Chest 2 View Result Date: 10/20/2023 CLINICAL DATA:  Status post fall with shortness of breath.  EXAM: CHEST - 2 VIEW COMPARISON:  Oct 09, 2023 FINDINGS:  There is stable multi lead AICD positioning. Multiple sternal wires and vascular clips are in place. The cardiac silhouette is mildly enlarged and unchanged in size. Low lung volumes are noted with mild linear scarring and/or atelectasis is seen within the mid left lung. No pleural effusion or pneumothorax is identified. A chronic sixth right rib fracture is seen with multilevel degenerative changes noted throughout the thoracic spine. IMPRESSION: 1. Evidence of prior median sternotomy/CABG. 2. Low lung volumes with mild linear scarring and/or atelectasis within the mid left lung. Electronically Signed   By: Virgle Grime M.D.   On: 10/20/2023 10:07   CT Head Wo Contrast Result Date: 10/20/2023 CLINICAL DATA:  Head trauma, minor (Age >= 65y) EXAM: CT HEAD WITHOUT CONTRAST TECHNIQUE: Contiguous axial images were obtained from the base of the skull through the vertex without intravenous contrast. RADIATION DOSE REDUCTION: This exam was performed according to the departmental dose-optimization program which includes automated exposure control, adjustment of the mA and/or kV according to patient size and/or use of iterative reconstruction technique. COMPARISON:  06/22/2023. FINDINGS: Brain: There is periventricular white matter decreased attenuation consistent with small vessel ischemic changes. Ventricles, sulci and cisterns are prominent consistent with age related involutional changes. No acute intracranial hemorrhage, mass effect or shift. No hydrocephalus. Vascular: No hyperdense vessel or unexpected calcification. Skull: Normal. Negative for fracture or focal lesion. Sinuses/Orbits: Mucoperiosteal thickening consistent with chronic maxillary an ethmoid and frontal sinusitis. Left maxillary antrum nearly completely opacified. Air-fluid level in the sphenoid consistent with acute sinusitis. IMPRESSION: 1. Atrophy and chronic small vessel ischemic changes. No acute intracranial process identified. 2. Acute on  chronic sinusitis. Electronically Signed   By: Sydell Eva M.D.   On: 10/20/2023 10:07    Procedures Procedures    Medications Ordered in ED Medications - No data to display  ED Course/ Medical Decision Making/ A&P                                 Medical Decision Making Problems Addressed: Accidental fall, initial encounter: acute illness or injury with systemic symptoms that poses a threat to life or bodily functions Current use of anticoagulant therapy: chronic illness or injury that poses a threat to life or bodily functions Hypoglycemia: acute illness or injury with systemic symptoms that poses a threat to life or bodily functions Stage 3b chronic kidney disease (HCC): chronic illness or injury with exacerbation, progression, or side effects of treatment that poses a threat to life or bodily functions  Amount and/or Complexity of Data Reviewed Independent Historian: EMS    Details: hx External Data Reviewed: notes. Labs: ordered. Decision-making details documented in ED Course. Radiology: ordered and independent interpretation performed. Decision-making details documented in ED Course. ECG/medicine tests: ordered and independent interpretation performed. Decision-making details documented in ED Course.  Risk Decision regarding hospitalization.   Iv ns. Continuous pulse ox and cardiac monitoring. Labs ordered/sent. Imaging ordered.  CBG 122.   Differential diagnosis includes head injury/SDH, contusion, etc. Dispo decision including potential need for admission considered - will get labs and imaging and reassess.   Reviewed nursing notes and prior charts for additional history. External reports reviewed. Additional history from: EMS.   Cardiac monitor: paced rhythm, rate 66.  Labs reviewed/interpreted by me - glucose 122. Ckd c/w baseline, k is normal. Wbc and hgb normal.   CT reviewed/interpreted by me -  no hem.   Xrays reviewed/interpreted by me - no pna.   Pt  comfortable appearing, no c/o of pain, is breathing comfortably.  Po fluids/food.   Pt currently appears stable for d/c.   Rec close pcp f/u.  Return precautions provided.          Final Clinical Impression(s) / ED Diagnoses Final diagnoses:  Accidental fall, initial encounter  Current use of anticoagulant therapy  Hypoglycemia  Stage 3b chronic kidney disease (HCC)    Rx / DC Orders ED Discharge Orders     None         Guadalupe Lee, MD 10/20/23 1028

## 2023-10-20 NOTE — ED Notes (Signed)
 [facility] called for transportation, added to the list.

## 2023-10-24 ENCOUNTER — Ambulatory Visit (INDEPENDENT_AMBULATORY_CARE_PROVIDER_SITE_OTHER): Payer: Medicare Other

## 2023-10-24 DIAGNOSIS — I442 Atrioventricular block, complete: Secondary | ICD-10-CM

## 2023-10-24 LAB — CUP PACEART REMOTE DEVICE CHECK
Battery Remaining Longevity: 95 mo
Battery Voltage: 3.01 V
Brady Statistic AP VP Percent: 48.38 %
Brady Statistic AP VS Percent: 0.12 %
Brady Statistic AS VP Percent: 49.37 %
Brady Statistic AS VS Percent: 2.13 %
Brady Statistic RA Percent Paced: 49.52 %
Brady Statistic RV Percent Paced: 97.75 %
Date Time Interrogation Session: 20250611200329
Implantable Lead Connection Status: 753985
Implantable Lead Connection Status: 753985
Implantable Lead Implant Date: 20071018
Implantable Lead Implant Date: 20221212
Implantable Lead Location: 753859
Implantable Lead Location: 753860
Implantable Lead Model: 5076
Implantable Lead Model: 6947
Implantable Pulse Generator Implant Date: 20221212
Lead Channel Impedance Value: 228 Ohm
Lead Channel Impedance Value: 285 Ohm
Lead Channel Impedance Value: 304 Ohm
Lead Channel Impedance Value: 342 Ohm
Lead Channel Pacing Threshold Amplitude: 0.5 V
Lead Channel Pacing Threshold Amplitude: 0.625 V
Lead Channel Pacing Threshold Pulse Width: 0.4 ms
Lead Channel Pacing Threshold Pulse Width: 0.4 ms
Lead Channel Sensing Intrinsic Amplitude: 0.625 mV
Lead Channel Sensing Intrinsic Amplitude: 0.625 mV
Lead Channel Sensing Intrinsic Amplitude: 7.625 mV
Lead Channel Sensing Intrinsic Amplitude: 7.625 mV
Lead Channel Setting Pacing Amplitude: 2 V
Lead Channel Setting Pacing Amplitude: 2.5 V
Lead Channel Setting Pacing Pulse Width: 0.4 ms
Lead Channel Setting Sensing Sensitivity: 5.6 mV
Zone Setting Status: 755011
Zone Setting Status: 755011

## 2023-10-25 DIAGNOSIS — Z87891 Personal history of nicotine dependence: Secondary | ICD-10-CM | POA: Diagnosis not present

## 2023-10-25 DIAGNOSIS — G3184 Mild cognitive impairment, so stated: Secondary | ICD-10-CM | POA: Diagnosis not present

## 2023-10-25 DIAGNOSIS — R296 Repeated falls: Secondary | ICD-10-CM | POA: Diagnosis not present

## 2023-10-25 DIAGNOSIS — Z7901 Long term (current) use of anticoagulants: Secondary | ICD-10-CM | POA: Diagnosis not present

## 2023-10-25 DIAGNOSIS — M109 Gout, unspecified: Secondary | ICD-10-CM | POA: Diagnosis not present

## 2023-10-25 DIAGNOSIS — E669 Obesity, unspecified: Secondary | ICD-10-CM | POA: Diagnosis not present

## 2023-10-25 DIAGNOSIS — I890 Lymphedema, not elsewhere classified: Secondary | ICD-10-CM | POA: Diagnosis not present

## 2023-10-25 DIAGNOSIS — Z8616 Personal history of COVID-19: Secondary | ICD-10-CM | POA: Diagnosis not present

## 2023-10-25 DIAGNOSIS — R32 Unspecified urinary incontinence: Secondary | ICD-10-CM | POA: Diagnosis not present

## 2023-10-25 DIAGNOSIS — E538 Deficiency of other specified B group vitamins: Secondary | ICD-10-CM | POA: Diagnosis not present

## 2023-10-25 DIAGNOSIS — E782 Mixed hyperlipidemia: Secondary | ICD-10-CM | POA: Diagnosis not present

## 2023-10-25 DIAGNOSIS — I130 Hypertensive heart and chronic kidney disease with heart failure and stage 1 through stage 4 chronic kidney disease, or unspecified chronic kidney disease: Secondary | ICD-10-CM | POA: Diagnosis not present

## 2023-10-25 DIAGNOSIS — Z7982 Long term (current) use of aspirin: Secondary | ICD-10-CM | POA: Diagnosis not present

## 2023-10-25 DIAGNOSIS — I480 Paroxysmal atrial fibrillation: Secondary | ICD-10-CM | POA: Diagnosis not present

## 2023-10-25 DIAGNOSIS — J302 Other seasonal allergic rhinitis: Secondary | ICD-10-CM | POA: Diagnosis not present

## 2023-10-25 DIAGNOSIS — E1122 Type 2 diabetes mellitus with diabetic chronic kidney disease: Secondary | ICD-10-CM | POA: Diagnosis not present

## 2023-10-25 DIAGNOSIS — N1832 Chronic kidney disease, stage 3b: Secondary | ICD-10-CM | POA: Diagnosis not present

## 2023-10-25 DIAGNOSIS — I2510 Atherosclerotic heart disease of native coronary artery without angina pectoris: Secondary | ICD-10-CM | POA: Diagnosis not present

## 2023-10-25 DIAGNOSIS — Z87442 Personal history of urinary calculi: Secondary | ICD-10-CM | POA: Diagnosis not present

## 2023-10-25 DIAGNOSIS — Z556 Problems related to health literacy: Secondary | ICD-10-CM | POA: Diagnosis not present

## 2023-10-25 DIAGNOSIS — Z6823 Body mass index (BMI) 23.0-23.9, adult: Secondary | ICD-10-CM | POA: Diagnosis not present

## 2023-10-25 DIAGNOSIS — E11649 Type 2 diabetes mellitus with hypoglycemia without coma: Secondary | ICD-10-CM | POA: Diagnosis not present

## 2023-10-25 DIAGNOSIS — I5022 Chronic systolic (congestive) heart failure: Secondary | ICD-10-CM | POA: Diagnosis not present

## 2023-10-25 DIAGNOSIS — M6289 Other specified disorders of muscle: Secondary | ICD-10-CM | POA: Diagnosis not present

## 2023-10-25 DIAGNOSIS — J189 Pneumonia, unspecified organism: Secondary | ICD-10-CM | POA: Diagnosis not present

## 2023-10-27 ENCOUNTER — Ambulatory Visit: Payer: Self-pay | Admitting: Internal Medicine

## 2023-10-29 ENCOUNTER — Emergency Department (HOSPITAL_COMMUNITY)

## 2023-10-29 ENCOUNTER — Emergency Department (HOSPITAL_COMMUNITY)
Admission: EM | Admit: 2023-10-29 | Discharge: 2023-10-29 | Disposition: A | Discharge status: Skilled Nursing Facility | Attending: Emergency Medicine | Admitting: Emergency Medicine

## 2023-10-29 DIAGNOSIS — R0789 Other chest pain: Secondary | ICD-10-CM | POA: Diagnosis not present

## 2023-10-29 DIAGNOSIS — N183 Chronic kidney disease, stage 3 unspecified: Secondary | ICD-10-CM | POA: Insufficient documentation

## 2023-10-29 DIAGNOSIS — Z87891 Personal history of nicotine dependence: Secondary | ICD-10-CM | POA: Insufficient documentation

## 2023-10-29 DIAGNOSIS — Z7901 Long term (current) use of anticoagulants: Secondary | ICD-10-CM | POA: Diagnosis not present

## 2023-10-29 DIAGNOSIS — I130 Hypertensive heart and chronic kidney disease with heart failure and stage 1 through stage 4 chronic kidney disease, or unspecified chronic kidney disease: Secondary | ICD-10-CM | POA: Insufficient documentation

## 2023-10-29 DIAGNOSIS — M79603 Pain in arm, unspecified: Secondary | ICD-10-CM | POA: Diagnosis not present

## 2023-10-29 DIAGNOSIS — Z8546 Personal history of malignant neoplasm of prostate: Secondary | ICD-10-CM | POA: Insufficient documentation

## 2023-10-29 DIAGNOSIS — I2510 Atherosclerotic heart disease of native coronary artery without angina pectoris: Secondary | ICD-10-CM | POA: Diagnosis not present

## 2023-10-29 DIAGNOSIS — L03113 Cellulitis of right upper limb: Secondary | ICD-10-CM | POA: Diagnosis not present

## 2023-10-29 DIAGNOSIS — M6289 Other specified disorders of muscle: Secondary | ICD-10-CM | POA: Diagnosis not present

## 2023-10-29 DIAGNOSIS — Z95810 Presence of automatic (implantable) cardiac defibrillator: Secondary | ICD-10-CM | POA: Diagnosis not present

## 2023-10-29 DIAGNOSIS — Z950 Presence of cardiac pacemaker: Secondary | ICD-10-CM | POA: Insufficient documentation

## 2023-10-29 DIAGNOSIS — I5022 Chronic systolic (congestive) heart failure: Secondary | ICD-10-CM | POA: Diagnosis not present

## 2023-10-29 DIAGNOSIS — I5041 Acute combined systolic (congestive) and diastolic (congestive) heart failure: Secondary | ICD-10-CM | POA: Diagnosis not present

## 2023-10-29 DIAGNOSIS — R079 Chest pain, unspecified: Secondary | ICD-10-CM | POA: Diagnosis not present

## 2023-10-29 DIAGNOSIS — L03313 Cellulitis of chest wall: Secondary | ICD-10-CM | POA: Diagnosis not present

## 2023-10-29 DIAGNOSIS — M7989 Other specified soft tissue disorders: Secondary | ICD-10-CM | POA: Diagnosis not present

## 2023-10-29 DIAGNOSIS — Z7982 Long term (current) use of aspirin: Secondary | ICD-10-CM | POA: Diagnosis not present

## 2023-10-29 DIAGNOSIS — N1832 Chronic kidney disease, stage 3b: Secondary | ICD-10-CM | POA: Diagnosis not present

## 2023-10-29 DIAGNOSIS — M79601 Pain in right arm: Secondary | ICD-10-CM | POA: Diagnosis present

## 2023-10-29 DIAGNOSIS — E1122 Type 2 diabetes mellitus with diabetic chronic kidney disease: Secondary | ICD-10-CM | POA: Diagnosis not present

## 2023-10-29 DIAGNOSIS — E11649 Type 2 diabetes mellitus with hypoglycemia without coma: Secondary | ICD-10-CM | POA: Diagnosis not present

## 2023-10-29 DIAGNOSIS — I959 Hypotension, unspecified: Secondary | ICD-10-CM | POA: Diagnosis not present

## 2023-10-29 DIAGNOSIS — M1811 Unilateral primary osteoarthritis of first carpometacarpal joint, right hand: Secondary | ICD-10-CM | POA: Diagnosis not present

## 2023-10-29 DIAGNOSIS — M25531 Pain in right wrist: Secondary | ICD-10-CM | POA: Diagnosis not present

## 2023-10-29 LAB — BASIC METABOLIC PANEL WITH GFR
Anion gap: 10 (ref 5–15)
BUN: 42 mg/dL — ABNORMAL HIGH (ref 8–23)
CO2: 24 mmol/L (ref 22–32)
Calcium: 8.5 mg/dL — ABNORMAL LOW (ref 8.9–10.3)
Chloride: 105 mmol/L (ref 98–111)
Creatinine, Ser: 2.07 mg/dL — ABNORMAL HIGH (ref 0.61–1.24)
GFR, Estimated: 29 mL/min — ABNORMAL LOW (ref >60)
Glucose, Bld: 98 mg/dL (ref 70–99)
Potassium: 4.9 mmol/L (ref 3.5–5.1)
Sodium: 139 mmol/L (ref 135–145)

## 2023-10-29 LAB — CBC WITH DIFFERENTIAL/PLATELET
Abs Immature Granulocytes: 0.02 10*3/uL (ref 0.00–0.07)
Basophils Absolute: 0 10*3/uL (ref 0.0–0.1)
Basophils Relative: 0 %
Eosinophils Absolute: 0 10*3/uL (ref 0.0–0.5)
Eosinophils Relative: 0 %
HCT: 30.5 % — ABNORMAL LOW (ref 39.0–52.0)
Hemoglobin: 9.6 g/dL — ABNORMAL LOW (ref 13.0–17.0)
Immature Granulocytes: 0 %
Lymphocytes Relative: 30 %
Lymphs Abs: 1.5 10*3/uL (ref 0.7–4.0)
MCH: 30.1 pg (ref 26.0–34.0)
MCHC: 31.5 g/dL (ref 30.0–36.0)
MCV: 95.6 fL (ref 80.0–100.0)
Monocytes Absolute: 2 10*3/uL — ABNORMAL HIGH (ref 0.1–1.0)
Monocytes Relative: 42 %
Neutro Abs: 1.4 10*3/uL — ABNORMAL LOW (ref 1.7–7.7)
Neutrophils Relative %: 28 %
Platelets: 205 10*3/uL (ref 150–400)
RBC: 3.19 MIL/uL — ABNORMAL LOW (ref 4.22–5.81)
RDW: 14.7 % (ref 11.5–15.5)
WBC: 5 10*3/uL (ref 4.0–10.5)
nRBC: 0 % (ref 0.0–0.2)

## 2023-10-29 LAB — TROPONIN I (HIGH SENSITIVITY)
Troponin I (High Sensitivity): 38 ng/L — ABNORMAL HIGH (ref <18)
Troponin I (High Sensitivity): 35 ng/L — ABNORMAL HIGH (ref <18)

## 2023-10-29 MED ORDER — CEPHALEXIN 500 MG PO CAPS
500.0000 mg | ORAL_CAPSULE | Freq: Once | ORAL | Status: AC
  Administered 2023-10-29: 500 mg via ORAL
  Filled 2023-10-29: qty 1

## 2023-10-29 MED ORDER — HYDROCODONE-ACETAMINOPHEN 5-325 MG PO TABS
1.0000 | ORAL_TABLET | Freq: Once | ORAL | Status: AC
  Administered 2023-10-29: 1 via ORAL
  Filled 2023-10-29: qty 1

## 2023-10-29 MED ORDER — CEPHALEXIN 500 MG PO CAPS: 500.0000 mg | ORAL_CAPSULE | Freq: Three times a day (TID) | ORAL | 0 refills | Status: AC

## 2023-10-29 MED ORDER — ACETAMINOPHEN 500 MG PO TABS: 1000.0000 mg | ORAL_TABLET | Freq: Once | ORAL | Status: DC

## 2023-10-29 MED ORDER — CEPHALEXIN 500 MG PO CAPS: 500.0000 mg | ORAL_CAPSULE | Freq: Three times a day (TID) | ORAL | 0 refills | Status: DC

## 2023-10-29 NOTE — ED Notes (Signed)
 [facility] contacted for transport. Staff asked to give them 30-45 minutes for arrival at hospital.

## 2023-10-29 NOTE — ED Triage Notes (Signed)
 Per facility patient c/o chest pain at approx 3a,.given nitroglycerin  tablet x1 by facility. Per facility no c/o chest pain after administration of nitroglycerin  tablet. Facility sent patient to be evaluated for right arm pain

## 2023-10-29 NOTE — Discharge Instructions (Addendum)
 We evaluated [name] for his right arm pain and chest pain.  We believe that he has cellulitis, a skin infection in his right arm.  We have prescribed him antibiotics.  He should take this 3 times daily.  He should follow-up with his primary doctor.  We also evaluated him for his chest pain.  His testing in the emergency department is reassuring.    Please have him follow-up closely with his primary doctor, if he has any new or worsening symptoms such as increasing redness, fevers, severe chest pain, or any other symptoms, bring him back to the emergency department.

## 2023-10-29 NOTE — ED Provider Notes (Signed)
 [place] EMERGENCY DEPARTMENT AT [facility] Provider Note  CSN: [id number] Arrival date & time: 10/29/23 1445  Chief Complaint(s) Chest Pain (C/o chest pain at approx. 3am facility gave him nitroglycerin  x1 no c/o chest pain since receiving meds )  HPI [name] is a 89 y.o. male history of CHF, coronary artery disease, hypertension, hyperlipidemia presenting to the emergency department with right arm pain.  Patient reports earlier he had some pain in the right arm around the wrist.  Reports currently has no pain.  Does not think he has had any fevers or chills, abdominal pain, chest pain, difficulty breathing, nausea, vomiting or other symptoms.  Per EMS, the patient had complained about chest pain at 3 AM and received nitroglycerin  x 1.  Patient does not remember this episode.  History limited due to dementia   Past Medical History Past Medical History:  Diagnosis Date   BPH (benign prostatic hypertrophy)    CHF (congestive heart failure) (HCC)    a. EF at 45-50% in 2016 b. EF at 40-45% by repeat echo in 12/2020   Chronic systolic heart failure (HCC)    Complete heart block [facility])    Medtronic pacemaker 04/2021 - Dr. Carolynne Citron   Coronary artery disease    a. Multivessel status post CABG 8/06, LIMA-LAD, SVG-CFX, SVG-distal RCA b. 12/2020: cath showing 3/3 patent grafts with 80% stenosis along LPAV prior to bifurication and medical management recommended.   Essential hypertension    Hyperlipidemia    Ischemic cardiomyopathy    Myelodysplasia, low grade (HCC) 11/27/2015   Nephrolithiasis    Prostate cancer (HCC)    Vitamin B 12 deficiency 07/27/2016   Patient Active Problem List   Diagnosis Date Noted   Pneumonia 10/09/2023   Pneumonia due to COVID-19 virus 05/22/2022   DNR (do not resuscitate) 05/22/2022   Hyperglycemia 05/22/2022   Type 2 diabetes mellitus with vascular disease (HCC) 05/22/2022   Adrenal carcinoma, left (HCC) 12/14/2021   Left adrenal mass (HCC)  11/27/2021   Pressure injury of skin 04/20/2021   Bradycardia 04/19/2021   Heart block AV complete (HCC) 04/18/2021   Acute combined systolic and diastolic congestive heart failure (HCC)    Sepsis due to undetermined organism (HCC) 04/13/2021   Lobar pneumonia (HCC) 04/13/2021   Acute respiratory failure with hypoxia and hypercarbia (HCC) 04/13/2021   Acute on chronic congestive heart failure (HCC) 04/12/2021   Acute respiratory failure with hypoxia (HCC) 04/12/2021   Unstable angina (HCC)    Family history of CABG    Chest pain 12/16/2020   Acute renal failure superimposed on stage 3 chronic kidney disease, unspecified acute renal failure type, unspecified whether stage 3a or 3b CKD (HCC) 11/07/2020   AKI (acute kidney injury) (HCC) 11/07/2020   Acute renal failure (ARF) (HCC) 11/07/2020   Gout involving toe 05/26/2019   Vitamin B 12 deficiency 07/27/2016   Myelodysplasia, low grade (HCC) 11/27/2015   Other primary cardiomyopathies 08/07/2011   Carotid stenosis 07/26/2011   Automatic implantable cardioverter-defibrillator in situ 07/26/2009   CAD, AUTOLOGOUS BYPASS GRAFT 08/24/2008   PROSTATE CANCER 08/03/2008   Other hyperlipidemia 08/03/2008   Essential hypertension 08/03/2008   MYOCARDIAL INFARCTION, HX OF 08/03/2008   Coronary artery disease 08/03/2008   Ischemic cardiomyopathy 08/03/2008   SYSTOLIC HEART FAILURE, CHRONIC 08/03/2008   NEPHROLITHIASIS, HX OF 08/03/2008   POSTSURGICAL AORTOCORONARY BYPASS STATUS 08/03/2008   Home Medication(s) Prior to Admission medications   Medication Sig Start Date End Date Taking? Authorizing Provider  cephALEXin (KEFLEX) 500 MG capsule Take 1 capsule (500 mg total) by mouth 3 (three) times daily. 10/29/23  Yes Mordecai Applebaum, MD  acetaminophen  (TYLENOL ) 500 MG tablet Take 1,000 mg by mouth every 8 (eight) hours as needed for mild pain (pain score 1-3).    [provider]  apixaban  (ELIQUIS ) 2.5 MG TABS tablet Take 1 tablet  (2.5 mg total) by mouth 2 (two) times daily. 09/04/22   Tammie Fall, MD  aspirin  EC 81 MG tablet Take 81 mg by mouth daily. Swallow whole.    [provider]  carvedilol  (COREG ) 12.5 MG tablet TAKE 1 TABLET(12.5 MG) BY MOUTH TWICE DAILY WITH A MEAL 04/09/22   Gerard Knight, MD  Cyanocobalamin  (VITAMIN DEFICIENCY SYSTEM-B12) 1000 MCG/ML KIT Inject 1 mL into the skin every 30 (thirty) days. On the 22nd of each month    [provider]  docusate sodium  (COLACE) 100 MG capsule Take 100 mg by mouth 2 (two) times daily.    [provider]  febuxostat  (ULORIC ) 40 MG tablet Take 40 mg by mouth daily. 08/30/23   [provider]  ferrous sulfate  325 (65 FE) MG EC tablet Take 325 mg by mouth 3 (three) times a week. Monday, Wednesday, and Friday    [provider]  furosemide  (LASIX ) 40 MG tablet TAKE 1 TABLET(40 MG) BY MOUTH DAILY Patient taking differently: Take 40 mg by mouth every other day. 07/28/21   Gerard Knight, MD  LANTUS  SOLOSTAR 100 UNIT/ML Solostar Pen Inject 20 Units into the skin daily. 06/19/23   [provider]  nitroGLYCERIN  (NITROSTAT ) 0.4 MG SL tablet Place 0.4 mg under the tongue every 5 (five) minutes as needed for chest pain.    [provider]  potassium chloride  (KLOR-CON ) 10 MEQ tablet Take 10 mEq by mouth daily. 09/26/23   [provider]  sacubitril -valsartan  (ENTRESTO ) 24-26 MG Take 1 tablet by mouth 2 (two) times daily. 07/04/22   Gerard Knight, MD  simvastatin  (ZOCOR ) 40 MG tablet TAKE 1 TABLET BY MOUTH EVERY DAY IN THE EVENING 05/22/22   Gerard Knight, MD                                                                                                                                    Past Surgical History Past Surgical History:  Procedure Laterality Date   CARDIAC DEFIBRILLATOR PLACEMENT     COLONOSCOPY     COLONOSCOPY N/A 08/19/2013   Procedure: COLONOSCOPY;  Surgeon: Ruby Corporal, MD;   Location: [facility]O SUITE;  Service: Endoscopy;  Laterality: N/A;  930   CORONARY ARTERY BYPASS GRAFT     8/06 with left anterior descending artery, saphenous vein graft to circumflex, saphenous vein graft to distal right coronary artery   CYSTOSCOPY/URETEROSCOPY/HOLMIUM LASER/STENT PLACEMENT Right 11/07/2020   Procedure: CYSTOSCOPY/URETEROSCOPY/HOLMIUM LASER/STENT PLACEMENT;  Surgeon: Osborn Blaze, MD;  Location: [facility] ORS;  Service:  Urology;  Laterality: Right;   HERNIA REPAIR     LEFT HEART CATH AND CORS/GRAFTS ANGIOGRAPHY N/A 12/19/2020   Procedure: LEFT HEART CATH AND CORS/GRAFTS ANGIOGRAPHY;  Surgeon: Arleen Lacer, MD;  Location: [facility] INVASIVE CV LAB;  Service: Cardiovascular;  Laterality: N/A;   PACEMAKER IMPLANT N/A 04/24/2021   Procedure: PACEMAKER IMPLANT;  Surgeon: Tammie Fall, MD;  Location: [facility] INVASIVE CV LAB;  Service: Cardiovascular;  Laterality: N/A;   POLYPECTOMY     PROSTATE SURGERY     TEMPORARY PACEMAKER N/A 04/21/2021   Procedure: TEMPORARY PACEMAKER;  Surgeon: [place], Peter M, MD;  Location: [facility] INVASIVE CV LAB;  Service: Cardiovascular;  Laterality: N/A;   Family History Family History  Problem Relation Age of Onset   Colon cancer Father    Coronary artery disease Other     Social History Social History   Tobacco Use   Smoking status: Former    Current packs/day: 0.00    Average packs/day: 0.3 packs/day for 44.0 years (11.0 ttl pk-yrs)    Types: Cigarettes    Start date: 05/14/1948    Quit date: 05/14/1992    Years since quitting: 31.4   Smokeless tobacco: Never  Vaping Use   Vaping status: Never Used  Substance Use Topics   Alcohol use: No   Drug use: No   Allergies Codeine  Review of Systems Review of Systems  All other systems reviewed and are negative.   Physical Exam Vital Signs  I have reviewed the triage vital signs BP 139/77   Pulse 70   Temp 97.9 F (36.6 C) (Oral)   Resp 20   SpO2 98%  Physical Exam Vitals and nursing note  reviewed.  Constitutional:      General: He is not in acute distress.    Appearance: Normal appearance.  HENT:     Mouth/Throat:     Mouth: Mucous membranes are moist.   Eyes:     Conjunctiva/sclera: Conjunctivae normal.    Cardiovascular:     Rate and Rhythm: Normal rate and regular rhythm.     Pulses:          Radial pulses are 2+ on the right side and 2+ on the left side.  Pulmonary:     Effort: Pulmonary effort is normal. No respiratory distress.     Breath sounds: Normal breath sounds.  Abdominal:     General: Abdomen is flat.     Palpations: Abdomen is soft.     Tenderness: There is no abdominal tenderness.   Musculoskeletal:     Right lower leg: No edema.     Left lower leg: No edema.     Comments: Mild erythema and tenderness over the right forearm, wrist, proximal dorsal hand.  Patient able to range at the wrist without significant discomfort.  Mild warmth.   Skin:    General: Skin is warm and dry.     Capillary Refill: Capillary refill takes less than 2 seconds.   Neurological:     Mental Status: He is alert and oriented to person, place, and time. Mental status is at baseline.   Psychiatric:        Mood and Affect: Mood normal.        Behavior: Behavior normal.     ED Results and Treatments Labs (all labs ordered are listed, but only abnormal results are displayed) Labs Reviewed  BASIC METABOLIC PANEL WITH GFR - Abnormal; Notable for the following components:      Result  Value   BUN 42 (*)    Creatinine, Ser 2.07 (*)    Calcium  8.5 (*)    GFR, Estimated 29 (*)    All other components within normal limits  CBC WITH DIFFERENTIAL/PLATELET - Abnormal; Notable for the following components:   RBC 3.19 (*)    Hemoglobin 9.6 (*)    HCT 30.5 (*)    Neutro Abs 1.4 (*)    Monocytes Absolute 2.0 (*)    All other components within normal limits  TROPONIN I (HIGH SENSITIVITY) - Abnormal; Notable for the following components:   Troponin I (High Sensitivity) 38  (*)    All other components within normal limits  TROPONIN I (HIGH SENSITIVITY) - Abnormal; Notable for the following components:   Troponin I (High Sensitivity) 35 (*)    All other components within normal limits                                                                                                                          Radiology DG Chest Port 1 View Result Date: 10/29/2023 CLINICAL DATA:  Chest pain EXAM: PORTABLE CHEST 1 VIEW COMPARISON:  10/20/2023 FINDINGS: Single frontal view of the chest demonstrates stable AICD. Cardiac silhouette is unremarkable. No acute airspace disease, effusion, or pneumothorax. Prior healed right rib fractures. No acute bony abnormalities. IMPRESSION: 1. No acute intrathoracic process. Electronically Signed   By: Bobbye Burrow M.D.   On: 10/29/2023 16:33   DG Wrist Complete Right Result Date: 10/29/2023 CLINICAL DATA:  Right wrist pain and swelling EXAM: RIGHT WRIST - COMPLETE 3+ VIEW COMPARISON:  06/22/2023 FINDINGS: Frontal, oblique, lateral, and ulnar deviated views of the right wrist are obtained. No fracture, subluxation, or dislocation. Stable osteoarthritis greatest in the radial aspect of the carpus and first carpometacarpal joint. Mild diffuse soft tissue swelling. Stable atherosclerosis. IMPRESSION: 1. Soft tissue swelling.  No acute fracture. 2. Stable osteoarthritis. Electronically Signed   By: Bobbye Burrow M.D.   On: 10/29/2023 16:33    Pertinent labs & imaging results that were available during my care of the patient were reviewed by me and considered in my medical decision making (see MDM for details).  Medications Ordered in ED Medications  HYDROcodone -acetaminophen  (NORCO/VICODIN) 5-325 MG per tablet 1 tablet (has no administration in time range)  cephALEXin (KEFLEX) capsule 500 mg (500 mg Oral Given 10/29/23 1940)  Procedures Procedures  (including critical care time)  Medical Decision Making / ED Course   MDM:  89 year old presenting to the emergency department right arm pain.  On exam, patient does have some erythema over the dorsal aspect of his right lower forearm/dorsal hand.  Seems most consistent with cellulitis.  No wound or fluctuance to suggest abscess.  Considered septic joint however patient able to range at the wrist and really only has tenderness with palpation and not range of motion.  Will obtain x-ray, patient limited historian so could also represent occult fracture or fall although no reported history of this.  There is also report that the patient had episode of chest pain last night at 3 AM.  He denies any recollection of this.  Will check EKG, single troponin, if elevated may need delta troponin.  Given patient chest pain-free, low concern for ongoing process.  Doubt PE, patient on chronic Eliquis , no hypoxia or tachypnea.  Doubt pneumonia without cough, fever but will check chest x-ray.  Doubt pneumothorax, lung sounds clear and equal.  Will reassess.  Clinical Course as of 10/29/23 1951  Tue Oct 29, 2023  1932 Troponin stable, mildly elevated likely due to underlying CKD and CHF.  Continues to deny any chest pain.  X-rays are reassuring.  No fracture.  Suspect cellulitis.  Will treat with Keflex.  Will discharge back to his nursing facility.  Return precautions discussed with patient and listed on discharge instructions. [WS]    Clinical Course User Index [WS] Mordecai Applebaum, MD     Additional history obtained: -Additional history obtained from ems -External records from outside source obtained and reviewed including: Chart review including previous notes, labs, imaging, consultation notes including prior notes    Lab Tests: -I ordered, reviewed, and interpreted labs.   The pertinent results include:   Labs Reviewed  BASIC METABOLIC PANEL WITH GFR -  Abnormal; Notable for the following components:      Result Value   BUN 42 (*)    Creatinine, Ser 2.07 (*)    Calcium  8.5 (*)    GFR, Estimated 29 (*)    All other components within normal limits  CBC WITH DIFFERENTIAL/PLATELET - Abnormal; Notable for the following components:   RBC 3.19 (*)    Hemoglobin 9.6 (*)    HCT 30.5 (*)    Neutro Abs 1.4 (*)    Monocytes Absolute 2.0 (*)    All other components within normal limits  TROPONIN I (HIGH SENSITIVITY) - Abnormal; Notable for the following components:   Troponin I (High Sensitivity) 38 (*)    All other components within normal limits  TROPONIN I (HIGH SENSITIVITY) - Abnormal; Notable for the following components:   Troponin I (High Sensitivity) 35 (*)    All other components within normal limits    Notable for CKD, mild elevated troponin, stable.   EKG   EKG Interpretation Date/Time:  Tuesday October 29 2023 19:36:28 EDT Ventricular Rate:  101 PR Interval:    QRS Duration:  156 QT Interval:  431 QTC Calculation: 455 R Axis:   -78  Text Interpretation: VENTRICULAR PACED RHYTHM Confirmed by Hiawatha Lout ([id number]) on 10/29/2023 7:50:35 PM         Imaging Studies ordered: I ordered imaging studies including CXRm, XR wrist On my interpretation imaging demonstrates no acute process I independently visualized and interpreted imaging. I agree with the radiologist interpretation   Medicines ordered and prescription drug management: Meds ordered this encounter  Medications  cephALEXin (KEFLEX) 500 MG capsule    Sig: Take 1 capsule (500 mg total) by mouth 3 (three) times daily.    Dispense:  28 capsule    Refill:  0   cephALEXin (KEFLEX) capsule 500 mg   DISCONTD: acetaminophen  (TYLENOL ) tablet 1,000 mg   HYDROcodone -acetaminophen  (NORCO/VICODIN) 5-325 MG per tablet 1 tablet    Refill:  0    -I have reviewed the patients home medicines and have made adjustments as needed   Reevaluation: After the interventions  noted above, I reevaluated the patient and found that their symptoms have improved  Co morbidities that complicate the patient evaluation  Past Medical History:  Diagnosis Date   BPH (benign prostatic hypertrophy)    CHF (congestive heart failure) (HCC)    a. EF at 45-50% in 2016 b. EF at 40-45% by repeat echo in 12/2020   Chronic systolic heart failure (HCC)    Complete heart block (HCC)    Medtronic pacemaker 04/2021 - Dr. Carolynne Citron   Coronary artery disease    a. Multivessel status post CABG 8/06, LIMA-LAD, SVG-CFX, SVG-distal RCA b. 12/2020: cath showing 3/3 patent grafts with 80% stenosis along LPAV prior to bifurication and medical management recommended.   Essential hypertension    Hyperlipidemia    Ischemic cardiomyopathy    Myelodysplasia, low grade (HCC) 11/27/2015   Nephrolithiasis    Prostate cancer (HCC)    Vitamin B 12 deficiency 07/27/2016      Dispostion: Disposition decision including need for hospitalization was considered, and patient discharged from emergency department.    Final Clinical Impression(s) / ED Diagnoses Final diagnoses:  Cellulitis of right wrist     This chart was dictated using voice recognition software.  Despite best efforts to proofread,  errors can occur which can change the documentation meaning.    Mordecai Applebaum, MD 10/29/23 [phone]

## 2023-11-02 DIAGNOSIS — I130 Hypertensive heart and chronic kidney disease with heart failure and stage 1 through stage 4 chronic kidney disease, or unspecified chronic kidney disease: Secondary | ICD-10-CM | POA: Diagnosis not present

## 2023-11-02 DIAGNOSIS — E11649 Type 2 diabetes mellitus with hypoglycemia without coma: Secondary | ICD-10-CM | POA: Diagnosis not present

## 2023-11-02 DIAGNOSIS — I5022 Chronic systolic (congestive) heart failure: Secondary | ICD-10-CM | POA: Diagnosis not present

## 2023-11-02 DIAGNOSIS — M6289 Other specified disorders of muscle: Secondary | ICD-10-CM | POA: Diagnosis not present

## 2023-11-02 DIAGNOSIS — N1832 Chronic kidney disease, stage 3b: Secondary | ICD-10-CM | POA: Diagnosis not present

## 2023-11-02 DIAGNOSIS — E1122 Type 2 diabetes mellitus with diabetic chronic kidney disease: Secondary | ICD-10-CM | POA: Diagnosis not present

## 2023-11-05 DIAGNOSIS — E11649 Type 2 diabetes mellitus with hypoglycemia without coma: Secondary | ICD-10-CM | POA: Diagnosis not present

## 2023-11-05 DIAGNOSIS — I130 Hypertensive heart and chronic kidney disease with heart failure and stage 1 through stage 4 chronic kidney disease, or unspecified chronic kidney disease: Secondary | ICD-10-CM | POA: Diagnosis not present

## 2023-11-05 DIAGNOSIS — M6289 Other specified disorders of muscle: Secondary | ICD-10-CM | POA: Diagnosis not present

## 2023-11-05 DIAGNOSIS — N1832 Chronic kidney disease, stage 3b: Secondary | ICD-10-CM | POA: Diagnosis not present

## 2023-11-05 DIAGNOSIS — I5022 Chronic systolic (congestive) heart failure: Secondary | ICD-10-CM | POA: Diagnosis not present

## 2023-11-05 DIAGNOSIS — E1122 Type 2 diabetes mellitus with diabetic chronic kidney disease: Secondary | ICD-10-CM | POA: Diagnosis not present

## 2023-11-08 DIAGNOSIS — I5022 Chronic systolic (congestive) heart failure: Secondary | ICD-10-CM | POA: Diagnosis not present

## 2023-11-08 DIAGNOSIS — M109 Gout, unspecified: Secondary | ICD-10-CM | POA: Diagnosis not present

## 2023-11-08 DIAGNOSIS — E1122 Type 2 diabetes mellitus with diabetic chronic kidney disease: Secondary | ICD-10-CM | POA: Diagnosis not present

## 2023-11-08 DIAGNOSIS — I130 Hypertensive heart and chronic kidney disease with heart failure and stage 1 through stage 4 chronic kidney disease, or unspecified chronic kidney disease: Secondary | ICD-10-CM | POA: Diagnosis not present

## 2023-11-08 DIAGNOSIS — M6289 Other specified disorders of muscle: Secondary | ICD-10-CM | POA: Diagnosis not present

## 2023-11-08 DIAGNOSIS — E11649 Type 2 diabetes mellitus with hypoglycemia without coma: Secondary | ICD-10-CM | POA: Diagnosis not present

## 2023-11-08 DIAGNOSIS — N1832 Chronic kidney disease, stage 3b: Secondary | ICD-10-CM | POA: Diagnosis not present

## 2023-11-11 DIAGNOSIS — E1122 Type 2 diabetes mellitus with diabetic chronic kidney disease: Secondary | ICD-10-CM | POA: Diagnosis not present

## 2023-11-11 DIAGNOSIS — I5022 Chronic systolic (congestive) heart failure: Secondary | ICD-10-CM | POA: Diagnosis not present

## 2023-11-11 DIAGNOSIS — N1832 Chronic kidney disease, stage 3b: Secondary | ICD-10-CM | POA: Diagnosis not present

## 2023-11-11 DIAGNOSIS — M6289 Other specified disorders of muscle: Secondary | ICD-10-CM | POA: Diagnosis not present

## 2023-11-11 DIAGNOSIS — E11649 Type 2 diabetes mellitus with hypoglycemia without coma: Secondary | ICD-10-CM | POA: Diagnosis not present

## 2023-11-11 DIAGNOSIS — I130 Hypertensive heart and chronic kidney disease with heart failure and stage 1 through stage 4 chronic kidney disease, or unspecified chronic kidney disease: Secondary | ICD-10-CM | POA: Diagnosis not present

## 2023-11-12 DIAGNOSIS — M6289 Other specified disorders of muscle: Secondary | ICD-10-CM | POA: Diagnosis not present

## 2023-11-12 DIAGNOSIS — E1122 Type 2 diabetes mellitus with diabetic chronic kidney disease: Secondary | ICD-10-CM | POA: Diagnosis not present

## 2023-11-12 DIAGNOSIS — I130 Hypertensive heart and chronic kidney disease with heart failure and stage 1 through stage 4 chronic kidney disease, or unspecified chronic kidney disease: Secondary | ICD-10-CM | POA: Diagnosis not present

## 2023-11-12 DIAGNOSIS — N1832 Chronic kidney disease, stage 3b: Secondary | ICD-10-CM | POA: Diagnosis not present

## 2023-11-12 DIAGNOSIS — I5022 Chronic systolic (congestive) heart failure: Secondary | ICD-10-CM | POA: Diagnosis not present

## 2023-11-12 DIAGNOSIS — E11649 Type 2 diabetes mellitus with hypoglycemia without coma: Secondary | ICD-10-CM | POA: Diagnosis not present

## 2023-11-18 DIAGNOSIS — E11649 Type 2 diabetes mellitus with hypoglycemia without coma: Secondary | ICD-10-CM | POA: Diagnosis not present

## 2023-11-18 DIAGNOSIS — M6289 Other specified disorders of muscle: Secondary | ICD-10-CM | POA: Diagnosis not present

## 2023-11-18 DIAGNOSIS — I130 Hypertensive heart and chronic kidney disease with heart failure and stage 1 through stage 4 chronic kidney disease, or unspecified chronic kidney disease: Secondary | ICD-10-CM | POA: Diagnosis not present

## 2023-11-18 DIAGNOSIS — N1832 Chronic kidney disease, stage 3b: Secondary | ICD-10-CM | POA: Diagnosis not present

## 2023-11-18 DIAGNOSIS — I5022 Chronic systolic (congestive) heart failure: Secondary | ICD-10-CM | POA: Diagnosis not present

## 2023-11-18 DIAGNOSIS — E1122 Type 2 diabetes mellitus with diabetic chronic kidney disease: Secondary | ICD-10-CM | POA: Diagnosis not present

## 2023-11-19 DIAGNOSIS — E1122 Type 2 diabetes mellitus with diabetic chronic kidney disease: Secondary | ICD-10-CM | POA: Diagnosis not present

## 2023-11-19 DIAGNOSIS — D631 Anemia in chronic kidney disease: Secondary | ICD-10-CM | POA: Diagnosis not present

## 2023-11-19 DIAGNOSIS — M79675 Pain in left toe(s): Secondary | ICD-10-CM | POA: Diagnosis not present

## 2023-11-19 DIAGNOSIS — N184 Chronic kidney disease, stage 4 (severe): Secondary | ICD-10-CM | POA: Diagnosis not present

## 2023-11-19 DIAGNOSIS — I10 Essential (primary) hypertension: Secondary | ICD-10-CM | POA: Diagnosis not present

## 2023-11-19 DIAGNOSIS — B351 Tinea unguium: Secondary | ICD-10-CM | POA: Diagnosis not present

## 2023-11-19 DIAGNOSIS — M79674 Pain in right toe(s): Secondary | ICD-10-CM | POA: Diagnosis not present

## 2023-11-19 DIAGNOSIS — R809 Proteinuria, unspecified: Secondary | ICD-10-CM | POA: Diagnosis not present

## 2023-11-19 NOTE — Progress Notes (Signed)
 Follow Up Visit   Patient Name: [name], male   Patient DOB: 21-Mar-1927 Date of Service: 11/19/2023  Patient MRN: [id number] Provider Creating Note: Bonnell Sherry, MD  [phone] Primary Care Physician:   [address] [place] [address] Additional Physicians/ Providers:    History of Present Illness [name] is a 89 y.o. male who is following up today for diabetes mellitus type 2 with chronic kidney disease, chronic kidney disease stage IV, proteinuria, hypertension, and anemia of chronic kidney disease.  The patient's chronic kidney disease was slightly worse on recent labs performed on 10/29/2023.  eGFR at that time was 29.  Regards hypertension blood pressure currently 127/99.  He also has anemia of chronic kidney disease with hemoglobin a bit worse at 9.6.  He is troubled with right hand pain.   Medications   Current Outpatient Medications:  .  Febuxostat  (ULORIC ) 40 MG tablet, Take 40 mg by mouth 1 (one) time each day, Disp: , Rfl:  .  HYDROcodone -acetaminophen  (NORCO) 5-325 MG per tablet, , Disp: , Rfl:  .  acetaminophen  (TYLENOL ) 500 MG tablet, Take 1,000 mg by mouth, Disp: , Rfl:  .  apixaban  (ELIQUIS ) 2.5 MG tablet, Take 2.5 mg by mouth, Disp: , Rfl:  .  aspirin  (ST JOSEPH) 81 MG EC tablet, Take 81 mg by mouth, Disp: , Rfl:  .  carvedilol  (COREG ) 12.5 MG tablet, Take 12.5 mg by mouth in the morning and 12.5 mg in the evening. Take with meals., Disp: , Rfl:  .  colchicine  0.6 MG tablet, , Disp: , Rfl:  .  Cyanocobalamin  (Vitamin Deficiency System-B12) 1000 MCG/ML kit, Inject 1 mL under the skin, Disp: , Rfl:  .  Docusate Sodium  (DSS) 100 MG capsule, Take 100 mg by mouth, Disp: , Rfl:  .  ferrous sulfate  325 (65 Fe) MG EC tablet, Take 325 mg by mouth, Disp: , Rfl:  .  furosemide  (LASIX ) 40 MG tablet, Take 1 tablet (40 mg total) by mouth 1 (one) time each day, Disp: 90 tablet, Rfl: 3 .  Lantus  SoloStar 100 UNIT/ML injection, , Disp: , Rfl:  .  nitroglycerin  (NITROSTAT )  0.4 MG SL tablet, Place 0.4 mg under the tongue, Disp: , Rfl:  .  potassium chloride  10 MEQ CR tablet, Take 10 mEq by mouth 1 (one) time each day, Disp: , Rfl:  .  sacubitril -valsartan  (Entresto ) 24-26 MG per tablet, Take 1 tablet by mouth in the morning and 1 tablet in the evening., Disp: , Rfl:  .  simvastatin  (ZOCOR ) 40 MG tablet, Take 1 tablet by mouth 1 (one) time each day in the evening, Disp: , Rfl:    Allergies Codeine  Problem List Patient Active Problem List  Diagnosis  . Acute combined systolic and diastolic congestive heart failure (HCC)  . Acute exacerbation of chronic congestive heart failure (HCC)  . Acute nontraumatic kidney injury (HCC)  . Acute-on-chronic renal failure (HCC)  . Automatic implantable cardiac defibrillator in situ  . Bradycardia  . Carotid artery stenosis  . Chronic systolic heart failure (HCC)  . Cobalamin deficiency  . Complete atrioventricular block (HCC)  . Coronary arteriosclerosis  . Coronary atherosclerosis of autologous vein bypass graft  . Essential hypertension  . Family history of operative procedure  . Gouty arthritis of toe  . Ischemic cardiomyopathy  . Malignant neoplasm of prostate (HCC)  . Myelodysplastic syndrome (clinical) (HCC)  . Personal history of urinary calculi  . Unstable angina (HCC)     Review of Systems  Constitutional:  Negative for chills and fever.  Respiratory:  Negative for cough and shortness of breath.   Cardiovascular:  Negative for chest pain and palpitations.  Gastrointestinal:  Negative for nausea and vomiting.  Genitourinary:  Negative for dysuria, hematuria and urgency.     History Past Medical History:  Diagnosis Date  . Calculus of kidney   . Chronic kidney disease   . Congestive heart failure (HCC)   . Coronary atherosclerosis of unspecified type of vessel, native or graft   . Essential hypertension   . Other and unspecified hyperlipidemia   . Other malignant neoplasm of unspecified site  [facility])     Past Surgical History:  Procedure Laterality Date  . CORONARY ARTERY BYPASS GRAFT     Family History  Problem Relation Age of Onset  . Cancer Father    Social History   Tobacco Use  . Smoking status: Former    Types: Cigarettes  . Smokeless tobacco: Never  Substance Use Topics  . Alcohol use: Never        Physical Exam  Vitals BP 127/99 (BP Location: Left upper arm, Patient Position: Sitting)   Pulse 59   Temp 99.1 F   SpO2 97%   PHYSICAL EXAM: General appearance: well developed, well nourished, NAD Eyes: anicteric sclerae, moist conjunctivae; no lid-lag  HENT: Bruise on forehead, hearing intact Neck: Trachea midline; supple Lungs: CTAB, with normal respiratory effort  CV: S1S2, irregular Abdomen: Soft, non-tender; bowel sounds present Extremities: Mild swelling in right hand Skin: Warm and dry, normal skin turgor, no rashes noted. Psych: Appropriate affect, alert and oriented to person, place and time    Laboratory Studies  Chemistry  Lab Units 02/19/23 1019 10/12/22 1308 12/05/21 1114  SODIUM mmol/L 141 141 140  POTASSIUM mmol/L 4.7 4.6 4.9  CHLORIDE mmol/L 105 101 106  CO2 mmol/L 28 31 24   MAGNESIUM  mg/dL  --   --  2.1  CALCIUM  mg/dL 9.1 9.3 9.0  PHOSPHORUS mg/dL 4.1 4.6* 3.9  ALK PHOS U/L  --   --  44  PTH pg/mL 50 72 22  VIT D 25 HYDROXY ng/mL  --   --  43  URIC ACID mg/dL  --   --  8.4*  GLUCOSE mg/dL 844* 828* 814*  ALBUMIN  g/dL 3.9 4.0 3.4*  3.4*  BUN mg/dL 39* 42* 30*  CREATININE mg/dL 8.28* 8.32* 8.60*  8.60*  HEMOGLOBIN A1C % of total Hgb  --   --  7.0*    Iron Studies  Lab Units 12/05/21 1114  IRON mcg/dL 76  FERRITIN ng/mL 30  TIBC mcg/dL (calc) 725  IRON SATURATION % (calc) 28    CBC  Lab Units 02/19/23 1019 10/12/22 1308 12/05/21 1328 12/05/21 1114  WBC AUTO Thousand/uL 3.9 5.8  --  3.8  HEMOGLOBIN g/dL 87.5* 86.6  --  89.0*  HEMOGLOBIN URINE   --  NEGATIVE NEGATIVE  --   HEMATOCRIT % 37.7* 40.2  --   34.0*  MCV fL 95.9 95.0  --  93.9  PLATELETS AUTO Thousand/uL 155 136*  --  128*    Urine  Lab Units 02/19/23 1019 10/12/22 1308 12/05/21 1328 12/05/21 1114  COLOR U   --  YELLOW YELLOW  --   KETONES U MG/DL   --  NEGATIVE NEGATIVE  --   PROT/CREAT RATIO UR mg/g creat 2.719*  2,719* 2.958*  2,958* 2.780*  2,780*  --   24 HR TOTAL PROTEIN mg/24 h  --   --   --  1,420*    Lab Results  Component Value Date   PTH 50 02/19/2023   CALCIUM  9.1 02/19/2023   PHOS 4.1 02/19/2023     Imaging and Other Studies     Orders Placed This Encounter  . Renal Function Panel  . CBC and Differential  . PTH, Intact  . Protein, Total, Random Urine w/Creatinine (Protein/Creat Ratio)       Impression/Recommendations  [name] is a 89 y.o. male with past medical history of left adrenal mass, coronary artery disease, combined systolic and diastolic heart failure, hypertension, nephrolithiasis, proteinuria who returns today in follow-up of chronic kidney disease stage IIIb.  1.  Diabetes mellitus type 2 with chronic kidney disease/chronic kidney disease stage IV.  Renal function slightly worse on recent blood work on 10/29/23.  eGFR was 29.  I have encouraged him to maintain adequate hydration status.  Repeat renal parameters prior to the next visit.  2.  Hypertension.  Blood pressure currently 127/99.  Maintain the patient on carvedilol  for hypertension control.  3.  Anemia of chronic kidney disease.  Hemoglobin down a bit to 9.6 on 10/29/2023.  If hemoglobin drops further may need to consider Epogen but hold off for now.  Return in about 4 months (around 03/21/2024).   Munsoor Lateef, MD

## 2023-11-20 DIAGNOSIS — I130 Hypertensive heart and chronic kidney disease with heart failure and stage 1 through stage 4 chronic kidney disease, or unspecified chronic kidney disease: Secondary | ICD-10-CM | POA: Diagnosis not present

## 2023-11-20 DIAGNOSIS — E11649 Type 2 diabetes mellitus with hypoglycemia without coma: Secondary | ICD-10-CM | POA: Diagnosis not present

## 2023-11-20 DIAGNOSIS — M6289 Other specified disorders of muscle: Secondary | ICD-10-CM | POA: Diagnosis not present

## 2023-11-20 DIAGNOSIS — N1832 Chronic kidney disease, stage 3b: Secondary | ICD-10-CM | POA: Diagnosis not present

## 2023-11-20 DIAGNOSIS — I5022 Chronic systolic (congestive) heart failure: Secondary | ICD-10-CM | POA: Diagnosis not present

## 2023-11-20 DIAGNOSIS — E1122 Type 2 diabetes mellitus with diabetic chronic kidney disease: Secondary | ICD-10-CM | POA: Diagnosis not present

## 2023-11-24 DIAGNOSIS — I890 Lymphedema, not elsewhere classified: Secondary | ICD-10-CM | POA: Diagnosis not present

## 2023-11-24 DIAGNOSIS — Z7901 Long term (current) use of anticoagulants: Secondary | ICD-10-CM | POA: Diagnosis not present

## 2023-11-24 DIAGNOSIS — E669 Obesity, unspecified: Secondary | ICD-10-CM | POA: Diagnosis not present

## 2023-11-24 DIAGNOSIS — J189 Pneumonia, unspecified organism: Secondary | ICD-10-CM | POA: Diagnosis not present

## 2023-11-24 DIAGNOSIS — R32 Unspecified urinary incontinence: Secondary | ICD-10-CM | POA: Diagnosis not present

## 2023-11-24 DIAGNOSIS — M6289 Other specified disorders of muscle: Secondary | ICD-10-CM | POA: Diagnosis not present

## 2023-11-24 DIAGNOSIS — I130 Hypertensive heart and chronic kidney disease with heart failure and stage 1 through stage 4 chronic kidney disease, or unspecified chronic kidney disease: Secondary | ICD-10-CM | POA: Diagnosis not present

## 2023-11-24 DIAGNOSIS — J302 Other seasonal allergic rhinitis: Secondary | ICD-10-CM | POA: Diagnosis not present

## 2023-11-24 DIAGNOSIS — N1832 Chronic kidney disease, stage 3b: Secondary | ICD-10-CM | POA: Diagnosis not present

## 2023-11-24 DIAGNOSIS — Z87442 Personal history of urinary calculi: Secondary | ICD-10-CM | POA: Diagnosis not present

## 2023-11-24 DIAGNOSIS — Z7982 Long term (current) use of aspirin: Secondary | ICD-10-CM | POA: Diagnosis not present

## 2023-11-24 DIAGNOSIS — M109 Gout, unspecified: Secondary | ICD-10-CM | POA: Diagnosis not present

## 2023-11-24 DIAGNOSIS — E538 Deficiency of other specified B group vitamins: Secondary | ICD-10-CM | POA: Diagnosis not present

## 2023-11-24 DIAGNOSIS — E782 Mixed hyperlipidemia: Secondary | ICD-10-CM | POA: Diagnosis not present

## 2023-11-24 DIAGNOSIS — E11649 Type 2 diabetes mellitus with hypoglycemia without coma: Secondary | ICD-10-CM | POA: Diagnosis not present

## 2023-11-24 DIAGNOSIS — G3184 Mild cognitive impairment, so stated: Secondary | ICD-10-CM | POA: Diagnosis not present

## 2023-11-24 DIAGNOSIS — Z556 Problems related to health literacy: Secondary | ICD-10-CM | POA: Diagnosis not present

## 2023-11-24 DIAGNOSIS — E1122 Type 2 diabetes mellitus with diabetic chronic kidney disease: Secondary | ICD-10-CM | POA: Diagnosis not present

## 2023-11-24 DIAGNOSIS — I5022 Chronic systolic (congestive) heart failure: Secondary | ICD-10-CM | POA: Diagnosis not present

## 2023-11-24 DIAGNOSIS — Z6823 Body mass index (BMI) 23.0-23.9, adult: Secondary | ICD-10-CM | POA: Diagnosis not present

## 2023-11-24 DIAGNOSIS — R296 Repeated falls: Secondary | ICD-10-CM | POA: Diagnosis not present

## 2023-11-24 DIAGNOSIS — Z87891 Personal history of nicotine dependence: Secondary | ICD-10-CM | POA: Diagnosis not present

## 2023-11-24 DIAGNOSIS — I2510 Atherosclerotic heart disease of native coronary artery without angina pectoris: Secondary | ICD-10-CM | POA: Diagnosis not present

## 2023-11-24 DIAGNOSIS — Z8616 Personal history of COVID-19: Secondary | ICD-10-CM | POA: Diagnosis not present

## 2023-11-24 DIAGNOSIS — I480 Paroxysmal atrial fibrillation: Secondary | ICD-10-CM | POA: Diagnosis not present

## 2023-11-25 DIAGNOSIS — I130 Hypertensive heart and chronic kidney disease with heart failure and stage 1 through stage 4 chronic kidney disease, or unspecified chronic kidney disease: Secondary | ICD-10-CM | POA: Diagnosis not present

## 2023-11-25 DIAGNOSIS — N1832 Chronic kidney disease, stage 3b: Secondary | ICD-10-CM | POA: Diagnosis not present

## 2023-11-25 DIAGNOSIS — M6289 Other specified disorders of muscle: Secondary | ICD-10-CM | POA: Diagnosis not present

## 2023-11-25 DIAGNOSIS — E11649 Type 2 diabetes mellitus with hypoglycemia without coma: Secondary | ICD-10-CM | POA: Diagnosis not present

## 2023-11-25 DIAGNOSIS — I5022 Chronic systolic (congestive) heart failure: Secondary | ICD-10-CM | POA: Diagnosis not present

## 2023-11-25 DIAGNOSIS — E1122 Type 2 diabetes mellitus with diabetic chronic kidney disease: Secondary | ICD-10-CM | POA: Diagnosis not present

## 2023-11-26 DIAGNOSIS — M6289 Other specified disorders of muscle: Secondary | ICD-10-CM | POA: Diagnosis not present

## 2023-11-26 DIAGNOSIS — E11649 Type 2 diabetes mellitus with hypoglycemia without coma: Secondary | ICD-10-CM | POA: Diagnosis not present

## 2023-11-26 DIAGNOSIS — E1122 Type 2 diabetes mellitus with diabetic chronic kidney disease: Secondary | ICD-10-CM | POA: Diagnosis not present

## 2023-11-26 DIAGNOSIS — N1832 Chronic kidney disease, stage 3b: Secondary | ICD-10-CM | POA: Diagnosis not present

## 2023-11-26 DIAGNOSIS — I130 Hypertensive heart and chronic kidney disease with heart failure and stage 1 through stage 4 chronic kidney disease, or unspecified chronic kidney disease: Secondary | ICD-10-CM | POA: Diagnosis not present

## 2023-11-26 DIAGNOSIS — I5022 Chronic systolic (congestive) heart failure: Secondary | ICD-10-CM | POA: Diagnosis not present

## 2023-11-29 DIAGNOSIS — E538 Deficiency of other specified B group vitamins: Secondary | ICD-10-CM | POA: Diagnosis not present

## 2023-11-29 DIAGNOSIS — M1A0720 Idiopathic chronic gout, left ankle and foot, without tophus (tophi): Secondary | ICD-10-CM | POA: Diagnosis not present

## 2023-11-29 DIAGNOSIS — R600 Localized edema: Secondary | ICD-10-CM | POA: Diagnosis not present

## 2023-11-29 DIAGNOSIS — R0789 Other chest pain: Secondary | ICD-10-CM | POA: Diagnosis not present

## 2023-11-29 DIAGNOSIS — E782 Mixed hyperlipidemia: Secondary | ICD-10-CM | POA: Diagnosis not present

## 2023-11-29 DIAGNOSIS — E1169 Type 2 diabetes mellitus with other specified complication: Secondary | ICD-10-CM | POA: Diagnosis not present

## 2023-11-29 DIAGNOSIS — I2510 Atherosclerotic heart disease of native coronary artery without angina pectoris: Secondary | ICD-10-CM | POA: Diagnosis not present

## 2023-11-29 DIAGNOSIS — D61818 Other pancytopenia: Secondary | ICD-10-CM | POA: Diagnosis not present

## 2023-11-29 DIAGNOSIS — J302 Other seasonal allergic rhinitis: Secondary | ICD-10-CM | POA: Diagnosis not present

## 2023-11-29 DIAGNOSIS — I5022 Chronic systolic (congestive) heart failure: Secondary | ICD-10-CM | POA: Diagnosis not present

## 2023-11-29 DIAGNOSIS — Z951 Presence of aortocoronary bypass graft: Secondary | ICD-10-CM | POA: Diagnosis not present

## 2023-11-29 DIAGNOSIS — I890 Lymphedema, not elsewhere classified: Secondary | ICD-10-CM | POA: Diagnosis not present

## 2023-11-29 DIAGNOSIS — N1831 Chronic kidney disease, stage 3a: Secondary | ICD-10-CM | POA: Diagnosis not present

## 2023-11-29 DIAGNOSIS — I480 Paroxysmal atrial fibrillation: Secondary | ICD-10-CM | POA: Diagnosis not present

## 2023-12-02 DIAGNOSIS — E11649 Type 2 diabetes mellitus with hypoglycemia without coma: Secondary | ICD-10-CM | POA: Diagnosis not present

## 2023-12-02 DIAGNOSIS — E1122 Type 2 diabetes mellitus with diabetic chronic kidney disease: Secondary | ICD-10-CM | POA: Diagnosis not present

## 2023-12-02 DIAGNOSIS — N1832 Chronic kidney disease, stage 3b: Secondary | ICD-10-CM | POA: Diagnosis not present

## 2023-12-02 DIAGNOSIS — I5022 Chronic systolic (congestive) heart failure: Secondary | ICD-10-CM | POA: Diagnosis not present

## 2023-12-02 DIAGNOSIS — I130 Hypertensive heart and chronic kidney disease with heart failure and stage 1 through stage 4 chronic kidney disease, or unspecified chronic kidney disease: Secondary | ICD-10-CM | POA: Diagnosis not present

## 2023-12-02 DIAGNOSIS — M6289 Other specified disorders of muscle: Secondary | ICD-10-CM | POA: Diagnosis not present

## 2023-12-06 DIAGNOSIS — M6289 Other specified disorders of muscle: Secondary | ICD-10-CM | POA: Diagnosis not present

## 2023-12-06 DIAGNOSIS — N1832 Chronic kidney disease, stage 3b: Secondary | ICD-10-CM | POA: Diagnosis not present

## 2023-12-06 DIAGNOSIS — E11649 Type 2 diabetes mellitus with hypoglycemia without coma: Secondary | ICD-10-CM | POA: Diagnosis not present

## 2023-12-06 DIAGNOSIS — I5022 Chronic systolic (congestive) heart failure: Secondary | ICD-10-CM | POA: Diagnosis not present

## 2023-12-06 DIAGNOSIS — E1122 Type 2 diabetes mellitus with diabetic chronic kidney disease: Secondary | ICD-10-CM | POA: Diagnosis not present

## 2023-12-06 DIAGNOSIS — I130 Hypertensive heart and chronic kidney disease with heart failure and stage 1 through stage 4 chronic kidney disease, or unspecified chronic kidney disease: Secondary | ICD-10-CM | POA: Diagnosis not present

## 2023-12-09 DIAGNOSIS — M6289 Other specified disorders of muscle: Secondary | ICD-10-CM | POA: Diagnosis not present

## 2023-12-09 DIAGNOSIS — E1122 Type 2 diabetes mellitus with diabetic chronic kidney disease: Secondary | ICD-10-CM | POA: Diagnosis not present

## 2023-12-09 DIAGNOSIS — N1832 Chronic kidney disease, stage 3b: Secondary | ICD-10-CM | POA: Diagnosis not present

## 2023-12-09 DIAGNOSIS — I5022 Chronic systolic (congestive) heart failure: Secondary | ICD-10-CM | POA: Diagnosis not present

## 2023-12-09 DIAGNOSIS — E11649 Type 2 diabetes mellitus with hypoglycemia without coma: Secondary | ICD-10-CM | POA: Diagnosis not present

## 2023-12-09 DIAGNOSIS — I130 Hypertensive heart and chronic kidney disease with heart failure and stage 1 through stage 4 chronic kidney disease, or unspecified chronic kidney disease: Secondary | ICD-10-CM | POA: Diagnosis not present

## 2023-12-16 DIAGNOSIS — E11649 Type 2 diabetes mellitus with hypoglycemia without coma: Secondary | ICD-10-CM | POA: Diagnosis not present

## 2023-12-16 DIAGNOSIS — E1122 Type 2 diabetes mellitus with diabetic chronic kidney disease: Secondary | ICD-10-CM | POA: Diagnosis not present

## 2023-12-16 DIAGNOSIS — I130 Hypertensive heart and chronic kidney disease with heart failure and stage 1 through stage 4 chronic kidney disease, or unspecified chronic kidney disease: Secondary | ICD-10-CM | POA: Diagnosis not present

## 2023-12-16 DIAGNOSIS — N1832 Chronic kidney disease, stage 3b: Secondary | ICD-10-CM | POA: Diagnosis not present

## 2023-12-16 DIAGNOSIS — I5022 Chronic systolic (congestive) heart failure: Secondary | ICD-10-CM | POA: Diagnosis not present

## 2023-12-16 DIAGNOSIS — M6289 Other specified disorders of muscle: Secondary | ICD-10-CM | POA: Diagnosis not present

## 2023-12-16 NOTE — Progress Notes (Signed)
 Remote pacemaker transmission.

## 2023-12-18 DIAGNOSIS — I130 Hypertensive heart and chronic kidney disease with heart failure and stage 1 through stage 4 chronic kidney disease, or unspecified chronic kidney disease: Secondary | ICD-10-CM | POA: Diagnosis not present

## 2023-12-18 DIAGNOSIS — E11649 Type 2 diabetes mellitus with hypoglycemia without coma: Secondary | ICD-10-CM | POA: Diagnosis not present

## 2023-12-18 DIAGNOSIS — E1122 Type 2 diabetes mellitus with diabetic chronic kidney disease: Secondary | ICD-10-CM | POA: Diagnosis not present

## 2023-12-18 DIAGNOSIS — I5022 Chronic systolic (congestive) heart failure: Secondary | ICD-10-CM | POA: Diagnosis not present

## 2023-12-18 DIAGNOSIS — N1832 Chronic kidney disease, stage 3b: Secondary | ICD-10-CM | POA: Diagnosis not present

## 2023-12-18 DIAGNOSIS — M6289 Other specified disorders of muscle: Secondary | ICD-10-CM | POA: Diagnosis not present

## 2023-12-24 NOTE — Progress Notes (Deleted)
 Patient Care Team: Shona Norleen PEDLAR, MD as PCP - General (Internal Medicine) Debera Jayson MATSU, MD as PCP - Cardiology (Cardiology) Waddell Danelle ORN, MD as PCP - Electrophysiology (Cardiology) Rogers Hai, MD (Inactive) as Medical Oncologist (Medical Oncology) Celestia Joesph SQUIBB, RN as Oncology Nurse Navigator (Oncology)  Clinic Day:  12/24/2023  Referring physician: Shona Norleen PEDLAR, MD   CHIEF COMPLAINT:  CC: ***  [name] 89 y.o. male was transferred to my care after his prior physician has left.   ASSESSMENT & PLAN:   Assessment & Plan: [name]  is a 89 y.o. male with ***  Assessment & Plan Myelodysplasia, low grade (HCC)  Bone marrow biopsy on 10/27/2015 showed hypercellular marrow with mild dyserythropoiesis and dismegakaryocytes.  Chromosome analysis was normal.  FISH panel was normal.  Flow cytometry on peripheral blood did not show any monoclonal B cell population.  Adrenal carcinoma, left (HCC) S/P XRT    The patient understands the plans discussed today and is in agreement with them.  He knows to contact our office if he develops concerns prior to his next appointment.  *** minutes of total time was spent for this patient encounter, including preparation, face-to-face counseling with the patient and coordination of care, physical exam, and documentation of the encounter. > 50% of the time was spent on counseling as documented under my assessment and plan.    Mickiel Dry, MD  [facility] [place] - A DEPT OF [facility] [address] [place] [address] Dept: [phone] Dept Fax: [phone]   No orders of the defined types were placed in this encounter.    ONCOLOGY HISTORY:   I have reviewed his chart and materials related to his cancer extensively and collaborated history with the patient. Summary of oncologic history is as follows:   ***  Current Treatment:  ***  INTERVAL  HISTORY:   [name] is here today for follow up. Patient is accompanied by *** .     I have reviewed the past medical history, past surgical history, social history and family history with the patient and they are unchanged from previous note.  ALLERGIES:  is allergic to codeine.  MEDICATIONS:  Current Outpatient Medications  Medication Sig Dispense Refill   acetaminophen  (TYLENOL ) 500 MG tablet Take 1,000 mg by mouth every 8 (eight) hours as needed for mild pain (pain score 1-3).     apixaban  (ELIQUIS ) 2.5 MG TABS tablet Take 1 tablet (2.5 mg total) by mouth 2 (two) times daily. 60 tablet 11   aspirin  EC 81 MG tablet Take 81 mg by mouth daily. Swallow whole.     carvedilol  (COREG ) 12.5 MG tablet TAKE 1 TABLET(12.5 MG) BY MOUTH TWICE DAILY WITH A MEAL 180 tablet 3   Cyanocobalamin  (VITAMIN DEFICIENCY SYSTEM-B12) 1000 MCG/ML KIT Inject 1 mL into the skin every 30 (thirty) days. On the 22nd of each month     docusate sodium  (COLACE) 100 MG capsule Take 100 mg by mouth 2 (two) times daily.     febuxostat  (ULORIC ) 40 MG tablet Take 40 mg by mouth daily.     ferrous sulfate  325 (65 FE) MG EC tablet Take 325 mg by mouth 3 (three) times a week. Monday, Wednesday, and Friday     furosemide  (LASIX ) 40 MG tablet TAKE 1 TABLET(40 MG) BY MOUTH DAILY (Patient taking differently: Take 40 mg by mouth every other day.) 90 tablet 3  LANTUS  SOLOSTAR 100 UNIT/ML Solostar Pen Inject 20 Units into the skin daily.     nitroGLYCERIN  (NITROSTAT ) 0.4 MG SL tablet Place 0.4 mg under the tongue every 5 (five) minutes as needed for chest pain.     potassium chloride  (KLOR-CON ) 10 MEQ tablet Take 10 mEq by mouth daily.     sacubitril -valsartan  (ENTRESTO ) 24-26 MG Take 1 tablet by mouth 2 (two) times daily. 60 tablet 11   simvastatin  (ZOCOR ) 40 MG tablet TAKE 1 TABLET BY MOUTH EVERY DAY IN THE EVENING 90 tablet 2   No current facility-administered medications for this visit.    REVIEW OF SYSTEMS:    Constitutional: Denies fevers, chills or abnormal weight loss Eyes: Denies blurriness of vision Ears, nose, mouth, throat, and face: Denies mucositis or sore throat Respiratory: Denies cough, dyspnea or wheezes Cardiovascular: Denies palpitation, chest discomfort or lower extremity swelling Gastrointestinal:  Denies nausea, heartburn or change in bowel habits Skin: Denies abnormal skin rashes Lymphatics: Denies new lymphadenopathy or easy bruising Neurological:Denies numbness, tingling or new weaknesses Behavioral/Psych: Mood is stable, no new changes  All other systems were reviewed with the patient and are negative.   VITALS:  There were no vitals taken for this visit.  Wt Readings from Last 3 Encounters:  10/20/23 158 lb 11.7 oz (72 kg)  10/09/23 158 lb 15.2 oz (72.1 kg)  08/07/23 159 lb 9.6 oz (72.4 kg)    There is no height or weight on file to calculate BMI.  Performance status (ECOG): {CHL ONC [id number]  PHYSICAL EXAM:   GENERAL:alert, no distress and comfortable SKIN: skin color, texture, turgor are normal, no rashes or significant lesions EYES: normal, Conjunctiva are pink and non-injected, sclera clear OROPHARYNX:no exudate, no erythema and lips, buccal mucosa, and tongue normal  NECK: supple, thyroid  normal size, non-tender, without nodularity LYMPH:  no palpable lymphadenopathy in the cervical, axillary or inguinal LUNGS: clear to auscultation and percussion with normal breathing effort HEART: regular rate & rhythm and no murmurs and no lower extremity edema ABDOMEN:abdomen soft, non-tender and normal bowel sounds Musculoskeletal:no cyanosis of digits and no clubbing  NEURO: alert & oriented x 3 with fluent speech, no focal motor/sensory deficits  LABORATORY DATA:  I have reviewed the data as listed    Component Value Date/Time   NA 139 10/29/2023 1535   NA 140 10/03/2022 0930   K 4.9 10/29/2023 1535   CL 105 10/29/2023 1535   CO2 24 10/29/2023 1535    GLUCOSE 98 10/29/2023 1535   BUN 42 (H) 10/29/2023 1535   BUN 35 10/03/2022 0930   CREATININE 2.07 (H) 10/29/2023 1535   CREATININE 1.28 (H) 06/09/2019 0808   CALCIUM  8.5 (L) 10/29/2023 1535   PROT 6.7 10/09/2023 0812   ALBUMIN  3.2 (L) 10/09/2023 0812   AST 22 10/09/2023 0812   ALT 14 10/09/2023 0812   ALKPHOS 63 10/09/2023 0812   BILITOT 0.7 10/09/2023 0812   GFRNONAA 29 (L) 10/29/2023 1535   GFRAA >60 06/17/2019 1318    Lab Results  Component Value Date   WBC 5.0 10/29/2023   NEUTROABS 1.4 (L) 10/29/2023   HGB 9.6 (L) 10/29/2023   HCT 30.5 (L) 10/29/2023   MCV 95.6 10/29/2023   PLT 205 10/29/2023      Chemistry      Component Value Date/Time   NA 139 10/29/2023 1535   NA 140 10/03/2022 0930   K 4.9 10/29/2023 1535   CL 105 10/29/2023 1535   CO2 24 10/29/2023 1535  BUN 42 (H) 10/29/2023 1535   BUN 35 10/03/2022 0930   CREATININE 2.07 (H) 10/29/2023 1535   CREATININE 1.28 (H) 06/09/2019 0808   GLU 112 05/29/2016 0000      Component Value Date/Time   CALCIUM  8.5 (L) 10/29/2023 1535   ALKPHOS 63 10/09/2023 0812   AST 22 10/09/2023 0812   ALT 14 10/09/2023 0812   BILITOT 0.7 10/09/2023 9187       RADIOGRAPHIC STUDIES: I have personally reviewed the radiological images as listed and agreed with the findings in the report. DG Chest Port 1 View CLINICAL DATA:  Chest pain  EXAM: PORTABLE CHEST 1 VIEW  COMPARISON:  10/20/2023  FINDINGS: Single frontal view of the chest demonstrates stable AICD. Cardiac silhouette is unremarkable. No acute airspace disease, effusion, or pneumothorax. Prior healed right rib fractures. No acute bony abnormalities.  IMPRESSION: 1. No acute intrathoracic process.  Electronically Signed   By: Ozell Daring M.D.   On: 10/29/2023 16:33 DG Wrist Complete Right CLINICAL DATA:  Right wrist pain and swelling  EXAM: RIGHT WRIST - COMPLETE 3+ VIEW  COMPARISON:  06/22/2023  FINDINGS: Frontal, oblique, lateral, and ulnar  deviated views of the right wrist are obtained. No fracture, subluxation, or dislocation. Stable osteoarthritis greatest in the radial aspect of the carpus and first carpometacarpal joint. Mild diffuse soft tissue swelling. Stable atherosclerosis.  IMPRESSION: 1. Soft tissue swelling.  No acute fracture. 2. Stable osteoarthritis.  Electronically Signed   By: Ozell Daring M.D.   On: 10/29/2023 16:33

## 2023-12-24 NOTE — Assessment & Plan Note (Deleted)
S/P XRT

## 2023-12-24 NOTE — Assessment & Plan Note (Deleted)
 Bone marrow biopsy on 10/27/2015 showed hypercellular marrow with mild dyserythropoiesis and dismegakaryocytes.  Chromosome analysis was normal.  FISH panel was normal.  Flow cytometry on peripheral blood did not show any monoclonal B cell population.

## 2023-12-25 ENCOUNTER — Inpatient Hospital Stay: Payer: Medicare Other | Admitting: Oncology

## 2023-12-25 ENCOUNTER — Inpatient Hospital Stay

## 2023-12-25 ENCOUNTER — Other Ambulatory Visit: Payer: Medicare Other

## 2023-12-25 DIAGNOSIS — C7492 Malignant neoplasm of unspecified part of left adrenal gland: Secondary | ICD-10-CM

## 2023-12-25 DIAGNOSIS — D46Z Other myelodysplastic syndromes: Secondary | ICD-10-CM

## 2023-12-28 ENCOUNTER — Emergency Department (HOSPITAL_COMMUNITY)
Admission: EM | Admit: 2023-12-28 | Discharge: 2023-12-29 | Disposition: A | Discharge status: Home / Self Care | Source: Skilled Nursing Facility | Attending: Emergency Medicine | Admitting: Emergency Medicine

## 2023-12-28 ENCOUNTER — Other Ambulatory Visit: Payer: Self-pay

## 2023-12-28 ENCOUNTER — Emergency Department (HOSPITAL_COMMUNITY)

## 2023-12-28 ENCOUNTER — Encounter (HOSPITAL_COMMUNITY): Payer: Self-pay | Admitting: Emergency Medicine

## 2023-12-28 DIAGNOSIS — M79643 Pain in unspecified hand: Secondary | ICD-10-CM | POA: Diagnosis not present

## 2023-12-28 DIAGNOSIS — M109 Gout, unspecified: Secondary | ICD-10-CM

## 2023-12-28 DIAGNOSIS — M10031 Idiopathic gout, right wrist: Secondary | ICD-10-CM | POA: Diagnosis not present

## 2023-12-28 DIAGNOSIS — Z7982 Long term (current) use of aspirin: Secondary | ICD-10-CM | POA: Diagnosis not present

## 2023-12-28 DIAGNOSIS — Z7901 Long term (current) use of anticoagulants: Secondary | ICD-10-CM | POA: Insufficient documentation

## 2023-12-28 DIAGNOSIS — M25531 Pain in right wrist: Secondary | ICD-10-CM | POA: Diagnosis not present

## 2023-12-28 DIAGNOSIS — Z79899 Other long term (current) drug therapy: Secondary | ICD-10-CM | POA: Insufficient documentation

## 2023-12-28 LAB — COMPREHENSIVE METABOLIC PANEL WITH GFR
ALT: 15 U/L (ref 0–44)
AST: 17 U/L (ref 15–41)
Albumin: 3.1 g/dL — ABNORMAL LOW (ref 3.5–5.0)
Alkaline Phosphatase: 60 U/L (ref 38–126)
Anion gap: 9 (ref 5–15)
BUN: 37 mg/dL — ABNORMAL HIGH (ref 8–23)
CO2: 24 mmol/L (ref 22–32)
Calcium: 8.4 mg/dL — ABNORMAL LOW (ref 8.9–10.3)
Chloride: 105 mmol/L (ref 98–111)
Creatinine, Ser: 1.66 mg/dL — ABNORMAL HIGH (ref 0.61–1.24)
GFR, Estimated: 38 mL/min — ABNORMAL LOW (ref >60)
Glucose, Bld: 58 mg/dL — ABNORMAL LOW (ref 70–99)
Potassium: 4.1 mmol/L (ref 3.5–5.1)
Sodium: 138 mmol/L (ref 135–145)
Total Bilirubin: 0.7 mg/dL (ref 0.0–1.2)
Total Protein: 6.4 g/dL — ABNORMAL LOW (ref 6.5–8.1)

## 2023-12-28 LAB — CBC WITH DIFFERENTIAL/PLATELET
Basophils Absolute: 0 K/uL (ref 0.0–0.1)
Basophils Relative: 0 %
Eosinophils Absolute: 0 K/uL (ref 0.0–0.5)
Eosinophils Relative: 0 %
HCT: 31.7 % — ABNORMAL LOW (ref 39.0–52.0)
Hemoglobin: 10.3 g/dL — ABNORMAL LOW (ref 13.0–17.0)
Lymphocytes Relative: 30 %
Lymphs Abs: 1.6 K/uL (ref 0.7–4.0)
MCH: 32.3 pg (ref 26.0–34.0)
MCHC: 32.5 g/dL (ref 30.0–36.0)
MCV: 99.4 fL (ref 80.0–100.0)
Monocytes Absolute: 2.3 K/uL — ABNORMAL HIGH (ref 0.1–1.0)
Monocytes Relative: 45 %
Neutro Abs: 1.3 K/uL — ABNORMAL LOW (ref 1.7–7.7)
Neutrophils Relative %: 25 %
Platelets: 146 K/uL — ABNORMAL LOW (ref 150–400)
RBC: 3.19 MIL/uL — ABNORMAL LOW (ref 4.22–5.81)
RDW: 14.6 % (ref 11.5–15.5)
Smear Review: NORMAL
WBC: 5.2 K/uL (ref 4.0–10.5)
nRBC: 0 % (ref 0.0–0.2)

## 2023-12-28 LAB — CBG MONITORING, ED
Glucose-Capillary: 47 mg/dL — ABNORMAL LOW (ref 70–99)
Glucose-Capillary: 117 mg/dL — ABNORMAL HIGH (ref 70–99)
Glucose-Capillary: 87 mg/dL (ref 70–99)
Glucose-Capillary: 92 mg/dL (ref 70–99)

## 2023-12-28 LAB — SEDIMENTATION RATE: Sed Rate: 66 mm/h — ABNORMAL HIGH (ref 0–20)

## 2023-12-28 LAB — URIC ACID: Uric Acid, Serum: 6.5 mg/dL (ref 3.7–8.6)

## 2023-12-28 LAB — C-REACTIVE PROTEIN: CRP: 6.4 mg/dL — ABNORMAL HIGH (ref <1.0)

## 2023-12-28 MED ORDER — DEXTROSE 50 % IV SOLN
1.0000 | Freq: Once | INTRAVENOUS | Status: AC
  Administered 2023-12-28: 50 mL via INTRAVENOUS
  Filled 2023-12-28: qty 50

## 2023-12-28 MED ORDER — HYDROCODONE-ACETAMINOPHEN 5-325 MG PO TABS
1.0000 | ORAL_TABLET | Freq: Once | ORAL | Status: AC
  Administered 2023-12-28: 1 via ORAL
  Filled 2023-12-28: qty 1

## 2023-12-28 MED ORDER — HYDROCODONE-ACETAMINOPHEN 5-325 MG PO TABS: 1.0000 | ORAL_TABLET | Freq: Four times a day (QID) | ORAL | 0 refills | Status: DC | PRN

## 2023-12-28 MED ORDER — COLCHICINE 0.6 MG PO TABS: 0.6000 mg | ORAL_TABLET | Freq: Every day | ORAL | 0 refills | Status: AC

## 2023-12-28 MED ORDER — LIDOCAINE HCL (PF) 1 % IJ SOLN
5.0000 mL | Freq: Once | INTRAMUSCULAR | Status: AC
  Administered 2023-12-28: 5 mL
  Filled 2023-12-28: qty 5

## 2023-12-28 MED ORDER — DOXYCYCLINE HYCLATE 100 MG PO CAPS: 100.0000 mg | ORAL_CAPSULE | Freq: Two times a day (BID) | ORAL | 0 refills | Status: DC

## 2023-12-28 MED ORDER — COLCHICINE 0.6 MG PO TABS
1.2000 mg | ORAL_TABLET | Freq: Once | ORAL | Status: AC
  Administered 2023-12-28: 1.2 mg via ORAL
  Filled 2023-12-28: qty 2

## 2023-12-28 MED ORDER — DEXAMETHASONE SODIUM PHOSPHATE 10 MG/ML IJ SOLN
10.0000 mg | Freq: Once | INTRAMUSCULAR | Status: AC
  Administered 2023-12-28: 10 mg via INTRAVENOUS
  Filled 2023-12-28: qty 1

## 2023-12-28 NOTE — ED Provider Notes (Addendum)
 [place] EMERGENCY DEPARTMENT AT [facility] Provider Note   CSN: [id number] Arrival date & time: 12/28/23  1237     Patient presents with: Hand Pain   [name] is a 89 y.o. male.   Patient is a 89 year old male who presents to the emergency department from his long-term care facility secondary to pain and swelling to the right wrist and hand.  Family member notes that he has had worsening pain over the past few days.  He does have a history of gout.  He was evaluated in the emergency department approximately 6 weeks ago and diagnosed with cellulitis to the affected arm as well.  Son does note that he has been experiencing ongoing swelling to that hand for the past few months.  Patient has no other active complaints at this point.  He denies any associated fever or chills.   Hand Pain       Prior to Admission medications   Medication Sig Start Date End Date Taking? Authorizing Provider  acetaminophen  (TYLENOL ) 500 MG tablet Take 1,000 mg by mouth every 8 (eight) hours as needed for mild pain (pain score 1-3).   Yes [provider]  apixaban  (ELIQUIS ) 2.5 MG TABS tablet Take 1 tablet (2.5 mg total) by mouth 2 (two) times daily. 09/04/22  Yes Waddell Danelle ORN, MD  aspirin  EC 81 MG tablet Take 81 mg by mouth daily. Swallow whole.   Yes [provider]  carvedilol  (COREG ) 12.5 MG tablet TAKE 1 TABLET(12.5 MG) BY MOUTH TWICE DAILY WITH A MEAL Patient taking differently: Take 12.5 mg by mouth 2 (two) times daily with a meal. 04/09/22  Yes Debera Jayson MATSU, MD  colchicine  0.6 MG tablet Take 1 tablet (0.6 mg total) by mouth daily for 3 days. 12/28/23 12/31/23 Yes Gaylon Melchor, Lonni D, PA-C  Cyanocobalamin  (VITAMIN DEFICIENCY SYSTEM-B12) 1000 MCG/ML KIT Inject 1 mL into the skin every 30 (thirty) days. On the 22nd of each month   Yes [provider]  docusate sodium  (COLACE) 100 MG capsule Take 100 mg by mouth 2 (two) times daily.   Yes [provider]  doxycycline  (VIBRAMYCIN ) 100 MG capsule Take 1 capsule (100 mg total) by mouth 2 (two) times daily. 12/28/23  Yes Daralene Lonni D, PA-C  febuxostat  (ULORIC ) 40 MG tablet Take 40 mg by mouth daily. 08/30/23  Yes [provider]  ferrous sulfate  325 (65 FE) MG EC tablet Take 325 mg by mouth 3 (three) times a week. Monday, Wednesday, and Friday   Yes [provider]  furosemide  (LASIX ) 40 MG tablet TAKE 1 TABLET(40 MG) BY MOUTH DAILY Patient taking differently: Take 40 mg by mouth every other day. 07/28/21  Yes Debera Jayson MATSU, MD  HYDROcodone -acetaminophen  (NORCO/VICODIN) 5-325 MG tablet Take 1-2 tablets by mouth every 6 (six) hours as needed for moderate pain (pain score 4-6). 11/01/23  Yes [provider]  LANTUS  SOLOSTAR 100 UNIT/ML Solostar Pen Inject 20 Units into the skin daily. 06/19/23  Yes [provider]  nitroGLYCERIN  (NITROSTAT ) 0.4 MG SL tablet Place 0.4 mg under the tongue every 5 (five) minutes as needed for chest pain.   Yes [provider]  potassium chloride  (KLOR-CON ) 10 MEQ tablet Take 10 mEq by mouth daily. 09/26/23  Yes [provider]  sacubitril -valsartan  (ENTRESTO ) 24-26 MG Take 1 tablet by mouth 2 (two) times daily. 07/04/22  Yes Debera Jayson MATSU, MD  simvastatin  (ZOCOR ) 40 MG tablet TAKE 1 TABLET BY MOUTH EVERY DAY  IN THE EVENING Patient taking differently: Take 40 mg by mouth daily at 6 PM. TAKE 1 TABLET BY MOUTH EVERY DAY IN THE EVENING 05/22/22  Yes Debera Jayson MATSU, MD    Allergies: Codeine    Review of Systems  Musculoskeletal:        Pain to the right wrist and hand  All other systems reviewed and are negative.   Updated Vital Signs BP (!) 124/47   Pulse 81   Temp 98.8 F (37.1 C) (Oral)   Resp 18   Ht 5' 6 (1.676 m)   Wt 72.1 kg   SpO2 94%   BMI 25.66 kg/m   Physical Exam Vitals and nursing note reviewed.  Constitutional:      Appearance: Normal appearance.  HENT:      Head: Normocephalic and atraumatic.     Nose: Nose normal.     Mouth/Throat:     Mouth: Mucous membranes are moist.  Eyes:     Extraocular Movements: Extraocular movements intact.     Conjunctiva/sclera: Conjunctivae normal.     Pupils: Pupils are equal, round, and reactive to light.  Cardiovascular:     Rate and Rhythm: Normal rate and regular rhythm.     Pulses: Normal pulses.     Heart sounds: Normal heart sounds. No murmur heard.    No gallop.  Pulmonary:     Effort: Pulmonary effort is normal. No respiratory distress.     Breath sounds: Normal breath sounds. No stridor. No wheezing, rhonchi or rales.  Musculoskeletal:        General: Normal range of motion.     Cervical back: Normal range of motion and neck supple.     Comments: Tender to palpation noted of the right wrist diffusely, edema noted to the wrist, distal forearm and right hand, no obvious deformity, radial pulse 2+, sensation intact distally, no skin breakdown or ulceration, no lacerations or abrasions, nontender palpation over right elbow or shoulder, range of motion intact but with pain  Skin:    General: Skin is warm and dry.  Neurological:     General: No focal deficit present.     Mental Status: He is alert and oriented to person, place, and time. Mental status is at baseline.  Psychiatric:        Mood and Affect: Mood normal.        Behavior: Behavior normal.        Thought Content: Thought content normal.        Judgment: Judgment normal.     (all labs ordered are listed, but only abnormal results are displayed) Labs Reviewed  COMPREHENSIVE METABOLIC PANEL WITH GFR - Abnormal; Notable for the following components:      Result Value   Glucose, Bld 58 (*)    BUN 37 (*)    Creatinine, Ser 1.66 (*)    Calcium  8.4 (*)    Total Protein 6.4 (*)    Albumin  3.1 (*)    GFR, Estimated 38 (*)    All other components within normal limits  CBC WITH DIFFERENTIAL/PLATELET - Abnormal; Notable for the following  components:   RBC 3.19 (*)    Hemoglobin 10.3 (*)    HCT 31.7 (*)    Platelets 146 (*)    Neutro Abs 1.3 (*)    Monocytes Absolute 2.3 (*)    All other components within normal limits  SEDIMENTATION RATE - Abnormal; Notable for the following components:   Sed Rate 66 (*)  All other components within normal limits  CBG MONITORING, ED - Abnormal; Notable for the following components:   Glucose-Capillary 47 (*)    All other components within normal limits  CBG MONITORING, ED - Abnormal; Notable for the following components:   Glucose-Capillary 117 (*)    All other components within normal limits  BODY FLUID CULTURE W GRAM STAIN  GRAM STAIN  URIC ACID  C-REACTIVE PROTEIN  SYNOVIAL CELL COUNT + DIFF, W/ CRYSTALS  CBG MONITORING, ED    EKG: None  Radiology: DG Wrist Complete Right Result Date: 12/28/2023 CLINICAL DATA:  Diffuse right wrist pain and swelling. History of gout. EXAM: RIGHT WRIST - COMPLETE 3+ VIEW COMPARISON:  10/29/2023 FINDINGS: Stable 1st metacarpal/carpal degenerative changes and moderate 1st IP joint degenerative changes. No fracture or dislocation. Mild dorsal soft tissue swelling without soft tissue calcification or bone erosion. Extensive arterial calcifications. IMPRESSION: 1. Mild dorsal soft tissue swelling without findings suggestive of gout. 2. Stable degenerative changes. Electronically Signed   By: Elspeth Bathe M.D.   On: 12/28/2023 13:46     Procedures   Medications Ordered in the ED  colchicine  tablet 1.2 mg (has no administration in time range)  lidocaine  (PF) (XYLOCAINE ) 1 % injection 5 mL (5 mLs Other Given by Other 12/28/23 1346)  dextrose  50 % solution 50 mL (50 mLs Intravenous Given 12/28/23 1456)  HYDROcodone -acetaminophen  (NORCO/VICODIN) 5-325 MG per tablet 1 tablet (1 tablet Oral Given 12/28/23 1532)  dexamethasone  (DECADRON ) injection 10 mg (10 mg Intravenous Given 12/28/23 1752)                                    Medical Decision  Making Amount and/or Complexity of Data Reviewed Labs: ordered. Radiology: ordered.  Risk Prescription drug management.   This patient presents to the ED for concern of wrist pain differential diagnosis includes septic joint, gout, cellulitis, arthritis, inflammatory arthritis, fracture, sprain    Additional history obtained:  Additional history obtained from family External records from outside source obtained and reviewed including medical records   Lab Tests:  I Ordered, and personally interpreted labs.  The pertinent results include: No leukocytosis, anemia at baseline, creatinine at baseline, normal liver function, normal electrolytes, elevated sed rate, negative uric acid   Imaging Studies ordered:  I ordered imaging studies including x-ray of right wrist I independently visualized and interpreted imaging which showed no acute osseous injury or lesions I agree with the radiologist interpretation   Medicines ordered and prescription drug management:  I ordered medication including Decadron , colchicine , Norco, D50 for gout and wrist pain Reevaluation of the patient after these medicines showed that the patient improved I have reviewed the patients home medicines and have made adjustments as needed   Problem List / ED Course:  Patient is doing well at this time and is stable for discharge back to his long-term care facility.  Suspect the symptoms are secondary to gout at this point.  Arthrocentesis was attempted though no fluid could be obtained.  Low suspicion for septic joint as he has no associated fever, leukocytosis.  Patient does have a history of gout as well.  Vital signs have remained stable with no indication for sepsis.  Patient was initially noted to be hypoglycemic on presentation.  He was given a dose of D50 and has been able to tolerate p.o. intake.  Will recommend decreasing his Lantus  dose by 20% and this was  added to his discharge instructions.  This was  discussed with the family as well.  Repeat blood sugar has been within normal limits and patient continues to mentate at his baseline per family.  Will recommend close follow-up with PCP or orthopedics within the next 48 hours for reevaluation.  Strict return precautions were provided as well for any new or worsening symptoms.  Patient and family voiced understanding and had no additional questions.  Patient was fully evaluated by attending physician who is in agreement to plan at this time.  Social Determinants of Health:  None        Final diagnoses:  Right wrist pain  Acute gout of right wrist, unspecified cause    ED Discharge Orders          Ordered    doxycycline  (VIBRAMYCIN ) 100 MG capsule  2 times daily        12/28/23 1755    colchicine  0.6 MG tablet  Daily        12/28/23 1755               Daralene Lonni BIRCH, PA-C 12/28/23 1804    Daralene Lonni BIRCH, PA-C 12/28/23 2300    Cleotilde Rogue, MD 12/29/23 0700

## 2023-12-28 NOTE — ED Notes (Signed)
 This nurse checked on patient's progress on crackers family stated patient only ate one cracker and would not eat any more. Provider made aware.

## 2023-12-28 NOTE — ED Notes (Signed)
 Patient given crackers, peanut butter and cola, informed of need to eat. Patient verbalized understanding.

## 2023-12-28 NOTE — ED Notes (Signed)
 Provider made aware that patient CBG is trending down.

## 2023-12-28 NOTE — ED Triage Notes (Signed)
 Pt via EMS from [facility] c/o right hand pain related to gout. A/O, vitals WNL. CBG 86.

## 2023-12-28 NOTE — Discharge Instructions (Signed)
 Please follow-up closely with orthopedics or primary care doctor within the next 48 hours for reevaluation.  Return to the emergency department immediately for any new or worsening symptoms.  Please decrease Lantus  dose by 20%.

## 2023-12-29 ENCOUNTER — Telehealth (HOSPITAL_COMMUNITY): Payer: Self-pay | Admitting: Emergency Medicine

## 2023-12-29 NOTE — ED Notes (Signed)
 This nurse and the NT cleaned the pt up from a BM and urine. New sheets, chuck pads, brief, and blankets provided. Pericare was performed to clean pt.

## 2023-12-29 NOTE — Telephone Encounter (Cosign Needed)
 Encounter created by mistake

## 2024-01-06 ENCOUNTER — Ambulatory Visit: Attending: Internal Medicine | Admitting: Internal Medicine

## 2024-01-06 ENCOUNTER — Encounter: Payer: Self-pay | Admitting: Internal Medicine

## 2024-01-06 VITALS — BP 126/56 | HR 66 | Ht 66.0 in

## 2024-01-06 DIAGNOSIS — I480 Paroxysmal atrial fibrillation: Secondary | ICD-10-CM | POA: Diagnosis not present

## 2024-01-06 NOTE — Progress Notes (Signed)
 HPI Mr. Cerasoli returns for PM followup. He has a h/o chb and is s/p PPM insertion. He denies chest pain or sob. He has developed chronic systolic heart failure but his symptoms are now class 2. He denies chest pain or sob.  Allergies  Allergen Reactions   Codeine Nausea And Vomiting     Current Outpatient Medications  Medication Sig Dispense Refill   acetaminophen  (TYLENOL ) 500 MG tablet Take 1,000 mg by mouth every 8 (eight) hours as needed for mild pain (pain score 1-3).     apixaban  (ELIQUIS ) 2.5 MG TABS tablet Take 1 tablet (2.5 mg total) by mouth 2 (two) times daily. 60 tablet 11   aspirin  EC 81 MG tablet Take 81 mg by mouth daily. Swallow whole.     carvedilol  (COREG ) 12.5 MG tablet TAKE 1 TABLET(12.5 MG) BY MOUTH TWICE DAILY WITH A MEAL 180 tablet 3   colchicine  0.6 MG tablet Take 1 tablet (0.6 mg total) by mouth daily for 3 days. 3 tablet 0   Cyanocobalamin  (VITAMIN DEFICIENCY SYSTEM-B12) 1000 MCG/ML KIT Inject 1 mL into the skin every 30 (thirty) days. On the 22nd of each month     docusate sodium  (COLACE) 100 MG capsule Take 100 mg by mouth 2 (two) times daily.     doxycycline  (VIBRAMYCIN ) 100 MG capsule Take 1 capsule (100 mg total) by mouth 2 (two) times daily. 20 capsule 0   febuxostat  (ULORIC ) 40 MG tablet Take 40 mg by mouth daily.     ferrous sulfate  325 (65 FE) MG EC tablet Take 325 mg by mouth 3 (three) times a week. Monday, Wednesday, and Friday     furosemide  (LASIX ) 40 MG tablet TAKE 1 TABLET(40 MG) BY MOUTH DAILY 90 tablet 3   HYDROcodone -acetaminophen  (NORCO/VICODIN) 5-325 MG tablet Take 1 tablet by mouth every 6 (six) hours as needed for severe pain (pain score 7-10). 10 tablet 0   LANTUS  SOLOSTAR 100 UNIT/ML Solostar Pen Inject 20 Units into the skin daily.     nitroGLYCERIN  (NITROSTAT ) 0.4 MG SL tablet Place 0.4 mg under the tongue every 5 (five) minutes as needed for chest pain.     potassium chloride  (KLOR-CON ) 10 MEQ tablet Take 10 mEq by mouth daily.      sacubitril -valsartan  (ENTRESTO ) 24-26 MG Take 1 tablet by mouth 2 (two) times daily. 60 tablet 11   simvastatin  (ZOCOR ) 40 MG tablet TAKE 1 TABLET BY MOUTH EVERY DAY IN THE EVENING 90 tablet 2   No current facility-administered medications for this visit.     Past Medical History:  Diagnosis Date   BPH (benign prostatic hypertrophy)    CHF (congestive heart failure) (HCC)    a. EF at 45-50% in 2016 b. EF at 40-45% by repeat echo in 12/2020   Chronic systolic heart failure (HCC)    Complete heart block [facility])    [employer] pacemaker 04/2021 - Dr. Waddell   Coronary artery disease    a. Multivessel status post CABG 8/06, LIMA-LAD, SVG-CFX, SVG-distal RCA b. 12/2020: cath showing 3/3 patent grafts with 80% stenosis along LPAV prior to bifurication and medical management recommended.   Essential hypertension    Hyperlipidemia    Ischemic cardiomyopathy    Myelodysplasia, low grade (HCC) 11/27/2015   Nephrolithiasis    Prostate cancer (HCC)    Vitamin B 12 deficiency 07/27/2016    ROS:   All systems reviewed and negative except as noted in the HPI.   Past Surgical History:  Procedure Laterality Date   CARDIAC DEFIBRILLATOR PLACEMENT     COLONOSCOPY     COLONOSCOPY N/A 08/19/2013   Procedure: COLONOSCOPY;  Surgeon: Claudis RAYMOND Rivet, MD;  Location: [facility] SUITE;  Service: Endoscopy;  Laterality: N/A;  930   CORONARY ARTERY BYPASS GRAFT     8/06 with left anterior descending artery, saphenous vein graft to circumflex, saphenous vein graft to distal right coronary artery   CYSTOSCOPY/URETEROSCOPY/HOLMIUM LASER/STENT PLACEMENT Right 11/07/2020   Procedure: CYSTOSCOPY/URETEROSCOPY/HOLMIUM LASER/STENT PLACEMENT;  Surgeon: Alvaro Hummer, MD;  Location: [facility] ORS;  Service: Urology;  Laterality: Right;   HERNIA REPAIR     LEFT HEART CATH AND CORS/GRAFTS ANGIOGRAPHY N/A 12/19/2020   Procedure: LEFT HEART CATH AND CORS/GRAFTS ANGIOGRAPHY;  Surgeon: Anner Alm ORN, MD;  Location: [facility] INVASIVE CV  LAB;  Service: Cardiovascular;  Laterality: N/A;   PACEMAKER IMPLANT N/A 04/24/2021   Procedure: PACEMAKER IMPLANT;  Surgeon: Waddell Danelle ORN, MD;  Location: [facility] INVASIVE CV LAB;  Service: Cardiovascular;  Laterality: N/A;   POLYPECTOMY     PROSTATE SURGERY     TEMPORARY PACEMAKER N/A 04/21/2021   Procedure: TEMPORARY PACEMAKER;  Surgeon: Swaz[place], Peter M, MD;  Location: [facility] INVASIVE CV LAB;  Service: Cardiovascular;  Laterality: N/A;     Family History  Problem Relation Age of Onset   Colon cancer Father    Coronary artery disease Other      Social History   Socioeconomic History   Marital status: Married    Spouse name: Not on file   Number of children: Not on file   Years of education: Not on file   Highest education level: Not on file  Occupational History   Occupation: Retired    Associate Professor: RETIRED  Tobacco Use   Smoking status: Former    Current packs/day: 0.00    Average packs/day: 0.3 packs/day for 44.0 years (11.0 ttl pk-yrs)    Types: Cigarettes    Start date: 05/14/1948    Quit date: 05/14/1992    Years since quitting: 31.6   Smokeless tobacco: Never  Vaping Use   Vaping status: Never Used  Substance and Sexual Activity   Alcohol use: No   Drug use: No   Sexual activity: Not on file    Comment: married 63 years  Other Topics Concern   Not on file  Social History Narrative   Not on file   Social Drivers of Health   Financial Resource Strain: Not on file  Food Insecurity: No Food Insecurity (10/09/2023)   Hunger Vital Sign    Worried About Running Out of Food in the Last Year: Never true    Ran Out of Food in the Last Year: Never true  Transportation Needs: No Transportation Needs (10/09/2023)   PRAPARE - Administrator, Civil Service (Medical): No    Lack of Transportation (Non-Medical): No  Physical Activity: Not on file  Stress: Not on file  Social Connections: Unknown (10/09/2023)   Social Connection and Isolation Panel    Frequency of  Communication with Friends and Family: Never    Frequency of Social Gatherings with Friends and Family: Once a week    Attends Religious Services: Never    Database administrator or Organizations: No    Attends Banker Meetings: Never    Marital Status: Patient declined  Intimate Partner Violence: Not At Risk (10/09/2023)   Humiliation, Afraid, Rape, and Kick questionnaire    Fear of Current or Ex-Partner: No  Emotionally Abused: No    Physically Abused: No    Sexually Abused: No     BP (!) 126/56 (BP Location: Left Arm, Cuff Size: Normal)   Pulse 66   Ht 5' 6 (1.676 m)   SpO2 98%   BMI 25.66 kg/m   Physical Exam:  Well appearing elderly man, NAD HEENT: Unremarkable Neck:  No JVD, no thyromegally Lymphatics:  No adenopathy Back:  No CVA tenderness Lungs:  Clear HEART:  Regular rate rhythm, no murmurs, no rubs, no clicks Abd:  soft, positive bowel sounds, no organomegally, no rebound, no guarding Ext:  2 plus pulses, no edema, no cyanosis, no clubbing Skin:  No rashes no nodules Neuro:  CN II through XII intact, motor grossly intact  DEVICE  Normal device function.  See PaceArt for details.   Assess/Plan:  New atrial flutter - he's had 90 minutes at a time of fib/flutter and I have recommended he start eliquis  and stop ASA. CHB - he is asymptomatic s/p PPM insertion.  HTN -his bp is controlled.  Chronic systolic heart failure - he appears to be euvolemic. No other change in meds.   Danelle Tavares Levinson,MD

## 2024-01-06 NOTE — Patient Instructions (Signed)

## 2024-01-08 DIAGNOSIS — R600 Localized edema: Secondary | ICD-10-CM | POA: Diagnosis not present

## 2024-01-08 DIAGNOSIS — D61818 Other pancytopenia: Secondary | ICD-10-CM | POA: Diagnosis not present

## 2024-01-08 DIAGNOSIS — I890 Lymphedema, not elsewhere classified: Secondary | ICD-10-CM | POA: Diagnosis not present

## 2024-01-08 DIAGNOSIS — N1831 Chronic kidney disease, stage 3a: Secondary | ICD-10-CM | POA: Diagnosis not present

## 2024-01-08 DIAGNOSIS — I5022 Chronic systolic (congestive) heart failure: Secondary | ICD-10-CM | POA: Diagnosis not present

## 2024-01-08 DIAGNOSIS — M1A0720 Idiopathic chronic gout, left ankle and foot, without tophus (tophi): Secondary | ICD-10-CM | POA: Diagnosis not present

## 2024-01-08 DIAGNOSIS — E1122 Type 2 diabetes mellitus with diabetic chronic kidney disease: Secondary | ICD-10-CM | POA: Diagnosis not present

## 2024-01-08 DIAGNOSIS — E782 Mixed hyperlipidemia: Secondary | ICD-10-CM | POA: Diagnosis not present

## 2024-01-08 DIAGNOSIS — I2510 Atherosclerotic heart disease of native coronary artery without angina pectoris: Secondary | ICD-10-CM | POA: Diagnosis not present

## 2024-01-08 DIAGNOSIS — Z951 Presence of aortocoronary bypass graft: Secondary | ICD-10-CM | POA: Diagnosis not present

## 2024-01-08 DIAGNOSIS — E538 Deficiency of other specified B group vitamins: Secondary | ICD-10-CM | POA: Diagnosis not present

## 2024-01-09 LAB — CUP PACEART INCLINIC DEVICE CHECK
Battery Remaining Longevity: 94 mo
Battery Voltage: 3.01 V
Brady Statistic AP VP Percent: 36.54 %
Brady Statistic AP VS Percent: 0.05 %
Brady Statistic AS VP Percent: 61.72 %
Brady Statistic AS VS Percent: 1.69 %
Brady Statistic RA Percent Paced: 37.66 %
Brady Statistic RV Percent Paced: 98.27 %
Date Time Interrogation Session: 20250828123800
Implantable Lead Connection Status: 753985
Implantable Lead Connection Status: 753985
Implantable Lead Implant Date: 20071018
Implantable Lead Implant Date: 20221212
Implantable Lead Location: 753859
Implantable Lead Location: 753860
Implantable Lead Model: 5076
Implantable Lead Model: 6947
Implantable Pulse Generator Implant Date: 20221212
Lead Channel Impedance Value: 247 Ohm
Lead Channel Impedance Value: 304 Ohm
Lead Channel Impedance Value: 323 Ohm
Lead Channel Impedance Value: 361 Ohm
Lead Channel Pacing Threshold Amplitude: 0.5 V
Lead Channel Pacing Threshold Amplitude: 0.75 V
Lead Channel Pacing Threshold Pulse Width: 0.4 ms
Lead Channel Pacing Threshold Pulse Width: 0.4 ms
Lead Channel Sensing Intrinsic Amplitude: 0.5 mV
Lead Channel Sensing Intrinsic Amplitude: 7.5 mV
Lead Channel Setting Pacing Amplitude: 2 V
Lead Channel Setting Pacing Amplitude: 2.5 V
Lead Channel Setting Pacing Pulse Width: 0.4 ms
Lead Channel Setting Sensing Sensitivity: 5.6 mV
Zone Setting Status: 755011
Zone Setting Status: 755011

## 2024-01-10 ENCOUNTER — Encounter: Admitting: Internal Medicine

## 2024-01-23 ENCOUNTER — Ambulatory Visit (INDEPENDENT_AMBULATORY_CARE_PROVIDER_SITE_OTHER): Payer: Medicare Other

## 2024-01-23 DIAGNOSIS — I442 Atrioventricular block, complete: Secondary | ICD-10-CM

## 2024-01-23 LAB — CUP PACEART REMOTE DEVICE CHECK
Battery Remaining Longevity: 92 mo
Battery Voltage: 3.01 V
Brady Statistic AP VP Percent: 51.32 %
Brady Statistic AP VS Percent: 0.47 %
Brady Statistic AS VP Percent: 46.06 %
Brady Statistic AS VS Percent: 2.15 %
Brady Statistic RA Percent Paced: 53.4 %
Brady Statistic RV Percent Paced: 97.38 %
Date Time Interrogation Session: 20250910194841
Implantable Lead Connection Status: 753985
Implantable Lead Connection Status: 753985
Implantable Lead Implant Date: 20071018
Implantable Lead Implant Date: 20221212
Implantable Lead Location: 753859
Implantable Lead Location: 753860
Implantable Lead Model: 5076
Implantable Lead Model: 6947
Implantable Pulse Generator Implant Date: 20221212
Lead Channel Impedance Value: 228 Ohm
Lead Channel Impedance Value: 285 Ohm
Lead Channel Impedance Value: 304 Ohm
Lead Channel Impedance Value: 342 Ohm
Lead Channel Pacing Threshold Amplitude: 0.5 V
Lead Channel Pacing Threshold Amplitude: 0.625 V
Lead Channel Pacing Threshold Pulse Width: 0.4 ms
Lead Channel Pacing Threshold Pulse Width: 0.4 ms
Lead Channel Sensing Intrinsic Amplitude: 0.375 mV
Lead Channel Sensing Intrinsic Amplitude: 0.375 mV
Lead Channel Sensing Intrinsic Amplitude: 8.125 mV
Lead Channel Sensing Intrinsic Amplitude: 8.125 mV
Lead Channel Setting Pacing Amplitude: 2 V
Lead Channel Setting Pacing Amplitude: 2.5 V
Lead Channel Setting Pacing Pulse Width: 0.4 ms
Lead Channel Setting Sensing Sensitivity: 5.6 mV
Zone Setting Status: 755011
Zone Setting Status: 755011

## 2024-01-31 ENCOUNTER — Ambulatory Visit: Payer: Self-pay | Admitting: Internal Medicine

## 2024-01-31 NOTE — Progress Notes (Signed)
 Remote PPM Transmission

## 2024-02-10 ENCOUNTER — Ambulatory Visit: Admitting: Cardiology

## 2024-02-11 DIAGNOSIS — B351 Tinea unguium: Secondary | ICD-10-CM | POA: Diagnosis not present

## 2024-02-11 DIAGNOSIS — Z23 Encounter for immunization: Secondary | ICD-10-CM | POA: Diagnosis not present

## 2024-02-11 DIAGNOSIS — M79674 Pain in right toe(s): Secondary | ICD-10-CM | POA: Diagnosis not present

## 2024-02-11 DIAGNOSIS — M79675 Pain in left toe(s): Secondary | ICD-10-CM | POA: Diagnosis not present

## 2024-02-18 DIAGNOSIS — E538 Deficiency of other specified B group vitamins: Secondary | ICD-10-CM | POA: Diagnosis not present

## 2024-03-26 ENCOUNTER — Ambulatory Visit: Admitting: Cardiology

## 2024-04-03 DIAGNOSIS — D61818 Other pancytopenia: Secondary | ICD-10-CM | POA: Diagnosis not present

## 2024-04-03 DIAGNOSIS — Z0001 Encounter for general adult medical examination with abnormal findings: Secondary | ICD-10-CM | POA: Diagnosis not present

## 2024-04-03 DIAGNOSIS — Z515 Encounter for palliative care: Secondary | ICD-10-CM | POA: Diagnosis not present

## 2024-04-03 DIAGNOSIS — Z951 Presence of aortocoronary bypass graft: Secondary | ICD-10-CM | POA: Diagnosis not present

## 2024-04-03 DIAGNOSIS — E162 Hypoglycemia, unspecified: Secondary | ICD-10-CM | POA: Diagnosis not present

## 2024-04-03 DIAGNOSIS — I890 Lymphedema, not elsewhere classified: Secondary | ICD-10-CM | POA: Diagnosis not present

## 2024-04-03 DIAGNOSIS — I5022 Chronic systolic (congestive) heart failure: Secondary | ICD-10-CM | POA: Diagnosis not present

## 2024-04-03 DIAGNOSIS — E782 Mixed hyperlipidemia: Secondary | ICD-10-CM | POA: Diagnosis not present

## 2024-04-03 DIAGNOSIS — R600 Localized edema: Secondary | ICD-10-CM | POA: Diagnosis not present

## 2024-04-03 DIAGNOSIS — Z0000 Encounter for general adult medical examination without abnormal findings: Secondary | ICD-10-CM | POA: Diagnosis not present

## 2024-04-03 DIAGNOSIS — E1169 Type 2 diabetes mellitus with other specified complication: Secondary | ICD-10-CM | POA: Diagnosis not present

## 2024-04-03 DIAGNOSIS — E538 Deficiency of other specified B group vitamins: Secondary | ICD-10-CM | POA: Diagnosis not present

## 2024-04-03 DIAGNOSIS — M1A0720 Idiopathic chronic gout, left ankle and foot, without tophus (tophi): Secondary | ICD-10-CM | POA: Diagnosis not present

## 2024-04-03 DIAGNOSIS — N1831 Chronic kidney disease, stage 3a: Secondary | ICD-10-CM | POA: Diagnosis not present

## 2024-04-23 ENCOUNTER — Ambulatory Visit: Payer: Self-pay | Admitting: Internal Medicine

## 2024-04-23 ENCOUNTER — Ambulatory Visit

## 2024-04-23 DIAGNOSIS — I442 Atrioventricular block, complete: Secondary | ICD-10-CM | POA: Diagnosis not present

## 2024-04-23 LAB — CUP PACEART REMOTE DEVICE CHECK
Battery Remaining Longevity: 87 mo
Battery Voltage: 3 V
Brady Statistic AP VP Percent: 51.13 %
Brady Statistic AP VS Percent: 0.06 %
Brady Statistic AS VP Percent: 48.08 %
Brady Statistic AS VS Percent: 0.72 %
Brady Statistic RA Percent Paced: 50.96 %
Brady Statistic RV Percent Paced: 99.22 %
Date Time Interrogation Session: 20251211003710
Implantable Lead Connection Status: 753985
Implantable Lead Connection Status: 753985
Implantable Lead Implant Date: 20071018
Implantable Lead Implant Date: 20221212
Implantable Lead Location: 753859
Implantable Lead Location: 753860
Implantable Lead Model: 5076
Implantable Lead Model: 6947
Implantable Pulse Generator Implant Date: 20221212
Lead Channel Impedance Value: 228 Ohm
Lead Channel Impedance Value: 285 Ohm
Lead Channel Impedance Value: 285 Ohm
Lead Channel Impedance Value: 323 Ohm
Lead Channel Pacing Threshold Amplitude: 0.5 V
Lead Channel Pacing Threshold Amplitude: 0.625 V
Lead Channel Pacing Threshold Pulse Width: 0.4 ms
Lead Channel Pacing Threshold Pulse Width: 0.4 ms
Lead Channel Sensing Intrinsic Amplitude: 0.625 mV
Lead Channel Sensing Intrinsic Amplitude: 0.625 mV
Lead Channel Sensing Intrinsic Amplitude: 8.5 mV
Lead Channel Sensing Intrinsic Amplitude: 8.5 mV
Lead Channel Setting Pacing Amplitude: 2 V
Lead Channel Setting Pacing Amplitude: 2.5 V
Lead Channel Setting Pacing Pulse Width: 0.4 ms
Lead Channel Setting Sensing Sensitivity: 5.6 mV
Zone Setting Status: 755011
Zone Setting Status: 755011

## 2024-04-30 NOTE — Progress Notes (Signed)
 Remote PPM Transmission

## 2024-05-07 ENCOUNTER — Encounter (HOSPITAL_COMMUNITY): Payer: Self-pay

## 2024-05-07 ENCOUNTER — Other Ambulatory Visit: Payer: Self-pay

## 2024-05-07 ENCOUNTER — Emergency Department (HOSPITAL_COMMUNITY)
Admission: EM | Admit: 2024-05-07 | Discharge: 2024-05-14 | Disposition: E | Source: Skilled Nursing Facility | Attending: Emergency Medicine | Admitting: Emergency Medicine

## 2024-05-07 ENCOUNTER — Emergency Department (HOSPITAL_COMMUNITY)

## 2024-05-07 DIAGNOSIS — J181 Lobar pneumonia, unspecified organism: Secondary | ICD-10-CM | POA: Diagnosis not present

## 2024-05-07 DIAGNOSIS — Z79899 Other long term (current) drug therapy: Secondary | ICD-10-CM | POA: Insufficient documentation

## 2024-05-07 DIAGNOSIS — D649 Anemia, unspecified: Secondary | ICD-10-CM | POA: Insufficient documentation

## 2024-05-07 DIAGNOSIS — Z794 Long term (current) use of insulin: Secondary | ICD-10-CM | POA: Diagnosis not present

## 2024-05-07 DIAGNOSIS — N289 Disorder of kidney and ureter, unspecified: Secondary | ICD-10-CM | POA: Diagnosis not present

## 2024-05-07 DIAGNOSIS — R509 Fever, unspecified: Secondary | ICD-10-CM | POA: Diagnosis not present

## 2024-05-07 DIAGNOSIS — I110 Hypertensive heart disease with heart failure: Secondary | ICD-10-CM | POA: Insufficient documentation

## 2024-05-07 DIAGNOSIS — I5020 Unspecified systolic (congestive) heart failure: Secondary | ICD-10-CM | POA: Diagnosis not present

## 2024-05-07 DIAGNOSIS — J189 Pneumonia, unspecified organism: Secondary | ICD-10-CM

## 2024-05-07 DIAGNOSIS — Z950 Presence of cardiac pacemaker: Secondary | ICD-10-CM | POA: Diagnosis not present

## 2024-05-07 DIAGNOSIS — R0603 Acute respiratory distress: Secondary | ICD-10-CM | POA: Diagnosis present

## 2024-05-07 DIAGNOSIS — Z7901 Long term (current) use of anticoagulants: Secondary | ICD-10-CM | POA: Diagnosis not present

## 2024-05-07 DIAGNOSIS — Z7982 Long term (current) use of aspirin: Secondary | ICD-10-CM | POA: Insufficient documentation

## 2024-05-07 DIAGNOSIS — R739 Hyperglycemia, unspecified: Secondary | ICD-10-CM | POA: Diagnosis not present

## 2024-05-07 DIAGNOSIS — I480 Paroxysmal atrial fibrillation: Secondary | ICD-10-CM | POA: Diagnosis not present

## 2024-05-07 LAB — CBC WITH DIFFERENTIAL/PLATELET
Basophils Absolute: 0 K/uL (ref 0.0–0.1)
Basophils Relative: 0 %
Eosinophils Absolute: 0.1 K/uL (ref 0.0–0.5)
Eosinophils Relative: 1 %
HCT: 35.9 % — ABNORMAL LOW (ref 39.0–52.0)
Hemoglobin: 11.3 g/dL — ABNORMAL LOW (ref 13.0–17.0)
Lymphocytes Relative: 20 %
Lymphs Abs: 1.6 K/uL (ref 0.7–4.0)
MCH: 30.6 pg (ref 26.0–34.0)
MCHC: 31.5 g/dL (ref 30.0–36.0)
MCV: 97.3 fL (ref 80.0–100.0)
Monocytes Absolute: 4.7 K/uL — ABNORMAL HIGH (ref 0.1–1.0)
Monocytes Relative: 58 %
Neutro Abs: 1.7 K/uL (ref 1.7–7.7)
Neutrophils Relative %: 21 %
Platelets: 236 K/uL (ref 150–400)
RBC: 3.69 MIL/uL — ABNORMAL LOW (ref 4.22–5.81)
RDW: 15.4 % (ref 11.5–15.5)
Smear Review: NORMAL
WBC: 8.1 K/uL (ref 4.0–10.5)
nRBC: 0 % (ref 0.0–0.2)

## 2024-05-07 LAB — RESP PANEL BY RT-PCR (RSV, FLU A&B, COVID)  RVPGX2
Influenza A by PCR: NEGATIVE
Influenza B by PCR: NEGATIVE
Resp Syncytial Virus by PCR: NEGATIVE
SARS Coronavirus 2 by RT PCR: NEGATIVE

## 2024-05-07 LAB — BLOOD GAS, ARTERIAL
Acid-base deficit: 0.7 mmol/L (ref 0.0–2.0)
Bicarbonate: 24.2 mmol/L (ref 20.0–28.0)
Drawn by: 23430
O2 Saturation: 92 %
Patient temperature: 39.8
pCO2 arterial: 45 mmHg (ref 32–48)
pH, Arterial: 7.35 (ref 7.35–7.45)
pO2, Arterial: 74 mmHg — ABNORMAL LOW (ref 83–108)

## 2024-05-07 LAB — COMPREHENSIVE METABOLIC PANEL WITH GFR
ALT: 7 U/L (ref 0–44)
AST: 17 U/L (ref 15–41)
Albumin: 4 g/dL (ref 3.5–5.0)
Alkaline Phosphatase: 61 U/L (ref 38–126)
Anion gap: 13 (ref 5–15)
BUN: 33 mg/dL — ABNORMAL HIGH (ref 8–23)
CO2: 22 mmol/L (ref 22–32)
Calcium: 8.9 mg/dL (ref 8.9–10.3)
Chloride: 106 mmol/L (ref 98–111)
Creatinine, Ser: 1.72 mg/dL — ABNORMAL HIGH (ref 0.61–1.24)
GFR, Estimated: 36 mL/min — ABNORMAL LOW
Glucose, Bld: 138 mg/dL — ABNORMAL HIGH (ref 70–99)
Potassium: 4.7 mmol/L (ref 3.5–5.1)
Sodium: 141 mmol/L (ref 135–145)
Total Bilirubin: 0.8 mg/dL (ref 0.0–1.2)
Total Protein: 6.7 g/dL (ref 6.5–8.1)

## 2024-05-07 LAB — PROTIME-INR
INR: 1.3 — ABNORMAL HIGH (ref 0.8–1.2)
Prothrombin Time: 16.8 s — ABNORMAL HIGH (ref 11.4–15.2)

## 2024-05-07 LAB — URINALYSIS, W/ REFLEX TO CULTURE (INFECTION SUSPECTED)
Bacteria, UA: NONE SEEN
Bilirubin Urine: NEGATIVE
Glucose, UA: NEGATIVE mg/dL
Hgb urine dipstick: NEGATIVE
Ketones, ur: NEGATIVE mg/dL
Leukocytes,Ua: NEGATIVE
Nitrite: NEGATIVE
Protein, ur: >=300 mg/dL — AB
Specific Gravity, Urine: 1.016 (ref 1.005–1.030)
pH: 5 (ref 5.0–8.0)

## 2024-05-07 LAB — LACTIC ACID, PLASMA: Lactic Acid, Venous: 1.5 mmol/L (ref 0.5–1.9)

## 2024-05-07 LAB — TROPONIN T, HIGH SENSITIVITY: Troponin T High Sensitivity: 163 ng/L (ref 0–19)

## 2024-05-07 LAB — PRO BRAIN NATRIURETIC PEPTIDE: Pro Brain Natriuretic Peptide: 22138 pg/mL — ABNORMAL HIGH

## 2024-05-07 MED ORDER — FUROSEMIDE 10 MG/ML IJ SOLN: 40.0000 mg | Freq: Once | INTRAMUSCULAR | Status: DC

## 2024-05-07 MED ORDER — SODIUM CHLORIDE 0.9 % IV SOLN
500.0000 mg | Freq: Once | INTRAVENOUS | Status: AC
  Administered 2024-05-07: 500 mg via INTRAVENOUS
  Filled 2024-05-07: qty 5

## 2024-05-07 MED ORDER — ACETAMINOPHEN 650 MG RE SUPP
650.0000 mg | Freq: Once | RECTAL | Status: AC
  Administered 2024-05-07: 650 mg via RECTAL
  Filled 2024-05-07: qty 1

## 2024-05-07 MED ORDER — SODIUM CHLORIDE 0.9 % IV SOLN
2.0000 g | Freq: Once | INTRAVENOUS | Status: AC
  Administered 2024-05-07: 2 g via INTRAVENOUS
  Filled 2024-05-07: qty 20

## 2024-05-07 MED ORDER — NITROGLYCERIN 2 % TD OINT: 1.0000 [in_us] | TOPICAL_OINTMENT | Freq: Once | TRANSDERMAL | Status: DC

## 2024-05-08 LAB — PATHOLOGIST SMEAR REVIEW

## 2024-05-12 LAB — CULTURE, BLOOD (ROUTINE X 2)
Culture: NO GROWTH
Culture: NO GROWTH
Special Requests: ADEQUATE
Special Requests: ADEQUATE

## 2024-05-14 NOTE — ED Notes (Addendum)
 Pt placed on 15 lpm nonrebreather due to pt's saturation dropping to 73 on 4 lpm [place]. Pt maintaining 80% on 15 L. MD notified and RT notified.

## 2024-05-14 NOTE — ED Provider Notes (Addendum)
 " [name] EMERGENCY DEPARTMENT AT [facility] Provider Note   CSN: [id number] Arrival date & time: 05-10-24  1610     Patient presents with: Respiratory Distress   [name] is a 89 y.o. male.   The history is provided by the nursing home and the EMS personnel. The history is limited by the condition of the patient (Altered mental status).   He has history of hypertension, hyperlipidemia, heart failure, systolic heart failure, complete heart block treated with a pacemaker, paroxysmal atrial fibrillation anticoagulated on apixaban  and comes in because of difficulty breathing through the day today.  EMS noted oxygen saturation on room air in the 70s, improved to 100% when placed on a nonrebreather mask.  He reportedly only takes his furosemide  intermittently.  Patient is obtunded and unable to give any history.  He does arrive with DNR paperwork.    Prior to Admission medications  Medication Sig Start Date End Date Taking? Authorizing Provider  acetaminophen  (TYLENOL ) 500 MG tablet Take 1,000 mg by mouth every 8 (eight) hours as needed for mild pain (pain score 1-3).    [provider]  apixaban  (ELIQUIS ) 2.5 MG TABS tablet Take 1 tablet (2.5 mg total) by mouth 2 (two) times daily. 09/04/22   Waddell Danelle ORN, MD  aspirin  EC 81 MG tablet Take 81 mg by mouth daily. Swallow whole.    [provider]  carvedilol  (COREG ) 12.5 MG tablet TAKE 1 TABLET(12.5 MG) BY MOUTH TWICE DAILY WITH A MEAL 04/09/22   Debera Jayson MATSU, MD  colchicine  0.6 MG tablet Take 1 tablet (0.6 mg total) by mouth daily for 3 days. 12/28/23 01/06/24  Daralene Bruckner D, PA-C  Cyanocobalamin  (VITAMIN DEFICIENCY SYSTEM-B12) 1000 MCG/ML KIT Inject 1 mL into the skin every 30 (thirty) days. On the 22nd of each month    [provider]  docusate sodium  (COLACE) 100 MG capsule Take 100 mg by mouth 2 (two) times daily.    [provider]  doxycycline  (VIBRAMYCIN ) 100 MG capsule  Take 1 capsule (100 mg total) by mouth 2 (two) times daily. 12/28/23   Daralene Bruckner BIRCH, PA-C  febuxostat  (ULORIC ) 40 MG tablet Take 40 mg by mouth daily. 08/30/23   [provider]  ferrous sulfate  325 (65 FE) MG EC tablet Take 325 mg by mouth 3 (three) times a week. Monday, Wednesday, and Friday    [provider]  furosemide  (LASIX ) 40 MG tablet TAKE 1 TABLET(40 MG) BY MOUTH DAILY 07/28/21   Debera Jayson MATSU, MD  HYDROcodone -acetaminophen  (NORCO/VICODIN) 5-325 MG tablet Take 1 tablet by mouth every 6 (six) hours as needed for severe pain (pain score 7-10). 12/28/23   Daralene Bruckner BIRCH, PA-C  LANTUS  SOLOSTAR 100 UNIT/ML Solostar Pen Inject 20 Units into the skin daily. 06/19/23   [provider]  nitroGLYCERIN  (NITROSTAT ) 0.4 MG SL tablet Place 0.4 mg under the tongue every 5 (five) minutes as needed for chest pain.    [provider]  potassium chloride  (KLOR-CON ) 10 MEQ tablet Take 10 mEq by mouth daily. 09/26/23   [provider]  sacubitril -valsartan  (ENTRESTO ) 24-26 MG Take 1 tablet by mouth 2 (two) times daily. 07/04/22   Debera Jayson MATSU, MD  simvastatin  (ZOCOR ) 40 MG tablet TAKE 1 TABLET BY MOUTH EVERY DAY IN THE EVENING 05/22/22   Debera Jayson MATSU, MD    Allergies: Codeine    Review of Systems  Unable to perform ROS: Mental status change    Updated Vital  Signs BP (!) 131/59 (BP Location: Right Arm)   Pulse 63   Temp (!) 103.7 F (39.8 C) (Rectal)   Resp (!) 37   Ht 5' 6 (1.676 m)   Wt 72.1 kg   SpO2 97%   BMI 25.66 kg/m   Physical Exam Vitals and nursing note reviewed.   89 year old male with diagonal respirations. Vital signs are significant for fever, rapid respiratory rate. Oxygen saturation is 97%, which is normal. Head is normocephalic and atraumatic. PERRLA,. Neck is nontender and supple without adenopathy or JVD. Back-no presacral edema. Lungs have coarse rales and rhonchi throughout all lung fields. Chest is  nontender. Heart has regular rate and rhythm without murmur. Abdomen is soft, flat, nontender. Extremities have trace edema. Skin is warm and dry without rash. Neurologic: Responds minimally to voice and pain, nonverbal, moves all extremities equally.  (all labs ordered are listed, but only abnormal results are displayed) Labs Reviewed  PRO BRAIN NATRIURETIC PEPTIDE - Abnormal; Notable for the following components:      Result Value   Pro Brain Natriuretic Peptide 22,138.0 (*)    All other components within normal limits  CBC WITH DIFFERENTIAL/PLATELET - Abnormal; Notable for the following components:   RBC 3.69 (*)    Hemoglobin 11.3 (*)    HCT 35.9 (*)    Monocytes Absolute 4.7 (*)    All other components within normal limits  BLOOD GAS, ARTERIAL - Abnormal; Notable for the following components:   pO2, Arterial 74 (*)    All other components within normal limits  COMPREHENSIVE METABOLIC PANEL WITH GFR - Abnormal; Notable for the following components:   Glucose, Bld 138 (*)    BUN 33 (*)    Creatinine, Ser 1.72 (*)    GFR, Estimated 36 (*)    All other components within normal limits  PROTIME-INR - Abnormal; Notable for the following components:   Prothrombin Time 16.8 (*)    INR 1.3 (*)    All other components within normal limits  TROPONIN T, HIGH SENSITIVITY - Abnormal; Notable for the following components:   Troponin T High Sensitivity 163 (*)    All other components within normal limits  RESP PANEL BY RT-PCR (RSV, FLU A&B, COVID)  RVPGX2  CULTURE, BLOOD (ROUTINE X 2)  CULTURE, BLOOD (ROUTINE X 2)  LACTIC ACID, PLASMA  LACTIC ACID, PLASMA  URINALYSIS, W/ REFLEX TO CULTURE (INFECTION SUSPECTED)  PATHOLOGIST SMEAR REVIEW  TROPONIN T, HIGH SENSITIVITY    EKG: EKG Interpretation Date/Time:  Thursday 05/15/2024 16:12:34 EST Ventricular Rate:  63 PR Interval:  196 QRS Duration:  184 QT Interval:  450 QTC Calculation: 461 R Axis:   -84  Text  Interpretation: VENTRICULAR PACED RHYTHM Ventricular trigeminy Left bundle branch block When compared with ECG of 10/29/2023, Premature ventricular complexes are now present Confirmed by Raford Lenis ([id number]) on 05/15/2024 4:18:08 PM  Radiology: [facility] Chest Port 1 View Result Date: 05/15/2024 CLINICAL DATA:  Shortness of breath EXAM: PORTABLE CHEST 1 VIEW COMPARISON:  10/29/2023, chest CT 05/22/2022 FINDINGS: Sternotomy and left-sided cardiac pacing device as before. Cardiomegaly. Airspace disease/consolidation left base with possible small left effusion compared to prior. Aortic atherosclerosis. No pneumothorax. Possible 13 mm left apical pulmonary nodule. Remote rib fractures. IMPRESSION: 1. Cardiomegaly without overt edema. 2. Airspace disease/consolidation at the left base suspicious for pneumonia with possible small left effusion. 3. Possible 13 mm left apical pulmonary nodule. Recommend further evaluation with chest CT. Electronically Signed   By: Luke  Scott M.D.   On: 2024/05/28 17:01     Procedures   Medications Ordered in the ED  furosemide  (LASIX ) injection 40 mg (has no administration in time range)  nitroGLYCERIN  (NITROGLYN) 2 % ointment 1 inch (has no administration in time range)                                    Medical Decision Making Amount and/or Complexity of Data Reviewed Labs: ordered. Radiology: ordered.  Risk OTC drugs. Prescription drug management.   Respiratory distress which is likely multifactorial.  He is a presentation with a wide range of treatment options and carries with a high risk of morbidity and complications.  Differential diagnosis includes, but is not limited to, pneumonia, respiratory infection with virus such as influenza or RSV or COVID-19, heart failure, ACS.  Because of the fever I have started the evolving sepsis pathway and also ordered testing for respiratory pathogens.  I have ordered acetaminophen  suppository.  Will hold off on antibiotics  for now.  I have reviewed his past records and note hospitalization for pneumonia on 10/09/2023, and a prescription status at that time was DNR.  I have reviewed his electrocardiogram, my interpretation is paced rhythm with PVCs-PVCs are new.  I have reviewed his laboratory tests, and my interpretation is normal pH and pCO2-no evidence of hypercarbic respiratory failure, stable renal insufficiency, elevated random glucose level, stable anemia.  Chest x-ray showed possible pneumonia versus effusion of the left base.  Have independently viewed the image, and agree with the radiologist's interpretation.  I have ordered antibiotics for community-acquired pneumonia.  4:48 PM Patient's respiratory status is declining, he has vomited a small amount with some aspiration.  Because of obtundation, he is not a candidate for noninvasive ventilation and because of the DNR status he is not a candidate for intubation.  I have reviewed x-ray and believe I see a retrocardiac infiltrate.  I ordered antibiotics for community-acquired pneumonia.  At this point, can only give supportive treatment.  Patient's condition gradually deteriorated with lowering blood pressure and lowering oxygen saturation.  He became apneic at 1806 and was pronounced dead.  Pacemaker was still functioning, so monitor still showed paced rhythm but without any blood pressure.  CRITICAL CARE Performed by: Alm Lias Total critical care time: 60 minutes Critical care time was exclusive of separately billable procedures and treating other patients. Critical care was necessary to treat or prevent imminent or life-threatening deterioration. Critical care was time spent personally by me on the following activities: development of treatment plan with patient and/or surrogate as well as nursing, discussions with consultants, evaluation of patient's response to treatment, examination of patient, obtaining history from patient or surrogate, ordering and  performing treatments and interventions, ordering and review of laboratory studies, ordering and review of radiographic studies, pulse oximetry and re-evaluation of patient's condition.     Final diagnoses:  Community acquired pneumonia of left lower lobe of lung  Renal insufficiency  Normochromic normocytic anemia  Elevated random blood glucose level    ED Discharge Orders     None          Lias Alm, MD 2024-05-28 1827   Family has arrived and has been notified of his death.  All questions were answered.   Lias Alm, MD May 28, 2024 1836  "

## 2024-05-14 NOTE — ED Notes (Signed)
 Evaluation for Donation called at [facility] with lauren.

## 2024-05-14 NOTE — ED Triage Notes (Signed)
 Pt arrived via [facility] from [facility] for respiratory distress. EMS found Pt at 70% O2 on room air upon arrival. Pt presents with course crackles. EDP at bedside during Triage. Pt placed on 4L Nasal Cannula with O2 Sats at 97%. Pts temp 103.76F Rectal.

## 2024-05-14 NOTE — ED Notes (Addendum)
 Son Notified of passing of patient. Son arrived to ED and visited with father. MD aware that pt's family is in family room awaiting to speak with him. Family stated they would like to have [facility] utilized. MD spoke with son. Per son, not going to be an ME case. Unable to edit flowsheets for post mortem care.

## 2024-05-14 NOTE — ED Notes (Addendum)
 Pt's pulse was auscultated for a full min and palpated radial and femoral pulse with no pulse felt. TOD called at 1806 with Lauren RN EKG sent and given to MD

## 2024-05-14 NOTE — ED Notes (Signed)
 MD notified of BP decrease. No new orders.

## 2024-05-14 NOTE — ED Notes (Signed)
 Urine obtained and sent to lab.

## 2024-05-14 DEATH — deceased

## 2024-06-17 ENCOUNTER — Ambulatory Visit: Admitting: Cardiology

## 2024-07-23 ENCOUNTER — Ambulatory Visit

## 2024-10-22 ENCOUNTER — Ambulatory Visit

## 2025-01-21 ENCOUNTER — Ambulatory Visit

## 2025-04-22 ENCOUNTER — Ambulatory Visit
